# Patient Record
Sex: Female | Born: 1990 | Race: White | Hispanic: No | Marital: Single | Smoking: Current every day smoker
Health system: Southern US, Community
[De-identification: ages and names within clinical notes are randomized; demographics above are authoritative.]

## PROBLEM LIST (undated history)

## (undated) ENCOUNTER — Inpatient Hospital Stay (HOSPITAL_COMMUNITY): Payer: Self-pay

## (undated) DIAGNOSIS — M5430 Sciatica, unspecified side: Secondary | ICD-10-CM

## (undated) DIAGNOSIS — G8929 Other chronic pain: Secondary | ICD-10-CM

## (undated) DIAGNOSIS — R112 Nausea with vomiting, unspecified: Secondary | ICD-10-CM

## (undated) DIAGNOSIS — J449 Chronic obstructive pulmonary disease, unspecified: Secondary | ICD-10-CM

## (undated) DIAGNOSIS — M549 Dorsalgia, unspecified: Secondary | ICD-10-CM

## (undated) DIAGNOSIS — K219 Gastro-esophageal reflux disease without esophagitis: Secondary | ICD-10-CM

## (undated) DIAGNOSIS — F32A Depression, unspecified: Secondary | ICD-10-CM

## (undated) DIAGNOSIS — B9689 Other specified bacterial agents as the cause of diseases classified elsewhere: Secondary | ICD-10-CM

## (undated) DIAGNOSIS — F419 Anxiety disorder, unspecified: Secondary | ICD-10-CM

## (undated) DIAGNOSIS — A630 Anogenital (venereal) warts: Secondary | ICD-10-CM

## (undated) DIAGNOSIS — K259 Gastric ulcer, unspecified as acute or chronic, without hemorrhage or perforation: Secondary | ICD-10-CM

## (undated) DIAGNOSIS — R251 Tremor, unspecified: Secondary | ICD-10-CM

## (undated) DIAGNOSIS — R062 Wheezing: Secondary | ICD-10-CM

## (undated) DIAGNOSIS — F410 Panic disorder [episodic paroxysmal anxiety] without agoraphobia: Secondary | ICD-10-CM

## (undated) DIAGNOSIS — K2960 Other gastritis without bleeding: Secondary | ICD-10-CM

## (undated) DIAGNOSIS — N760 Acute vaginitis: Secondary | ICD-10-CM

## (undated) DIAGNOSIS — Z9889 Other specified postprocedural states: Secondary | ICD-10-CM

## (undated) DIAGNOSIS — R87629 Unspecified abnormal cytological findings in specimens from vagina: Secondary | ICD-10-CM

## (undated) DIAGNOSIS — Z87442 Personal history of urinary calculi: Secondary | ICD-10-CM

## (undated) DIAGNOSIS — A599 Trichomoniasis, unspecified: Secondary | ICD-10-CM

## (undated) DIAGNOSIS — E7800 Pure hypercholesterolemia, unspecified: Secondary | ICD-10-CM

## (undated) DIAGNOSIS — F99 Mental disorder, not otherwise specified: Secondary | ICD-10-CM

## (undated) DIAGNOSIS — M419 Scoliosis, unspecified: Secondary | ICD-10-CM

## (undated) DIAGNOSIS — R001 Bradycardia, unspecified: Secondary | ICD-10-CM

## (undated) DIAGNOSIS — K279 Peptic ulcer, site unspecified, unspecified as acute or chronic, without hemorrhage or perforation: Secondary | ICD-10-CM

## (undated) HISTORY — DX: Chronic obstructive pulmonary disease, unspecified: J44.9

## (undated) HISTORY — DX: Panic disorder (episodic paroxysmal anxiety): F41.0

## (undated) HISTORY — DX: Trichomoniasis, unspecified: A59.9

## (undated) HISTORY — DX: Mental disorder, not otherwise specified: F99

## (undated) HISTORY — PX: HERNIA REPAIR: SHX51

## (undated) HISTORY — DX: Scoliosis, unspecified: M41.9

## (undated) HISTORY — DX: Depression, unspecified: F32.A

## (undated) HISTORY — PX: BACK SURGERY: SHX140

## (undated) HISTORY — DX: Unspecified abnormal cytological findings in specimens from vagina: R87.629

## (undated) HISTORY — DX: Pure hypercholesterolemia, unspecified: E78.00

## (undated) HISTORY — DX: Tremor, unspecified: R25.1

## (undated) HISTORY — PX: COLPOSCOPY: SHX161

---
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
## 2009-09-30 ENCOUNTER — Emergency Department (HOSPITAL_COMMUNITY): Admission: EM | Admit: 2009-09-30 | Discharge: 2009-09-30 | Payer: Self-pay | Admitting: Emergency Medicine

## 2010-12-09 LAB — PREGNANCY, URINE: Preg Test, Ur: NEGATIVE

## 2010-12-09 LAB — URINALYSIS, ROUTINE W REFLEX MICROSCOPIC
Bilirubin Urine: NEGATIVE
Glucose, UA: NEGATIVE mg/dL
Hgb urine dipstick: NEGATIVE
Ketones, ur: NEGATIVE mg/dL
Nitrite: NEGATIVE
Protein, ur: NEGATIVE mg/dL
Specific Gravity, Urine: 1.01 (ref 1.005–1.030)
Urobilinogen, UA: 0.2 mg/dL (ref 0.0–1.0)
pH: 6 (ref 5.0–8.0)

## 2011-02-05 NOTE — Op Note (Signed)
NAME:  CHARIKA, MIKELSON NO.:  [id number]   MEDICAL RECORD NO.:  [id number]          PATIENT TYPE:  OIB   LOCATION:  3524                         FACILITY:  [facility]   PHYSICIAN:  Hewitt Shorts, M.D.DATE OF BIRTH:  1991/07/07   DATE OF PROCEDURE:  01/14/2008  DATE OF DISCHARGE:                               OPERATIVE REPORT   PREOPERATIVE DIAGNOSES:  1. Left L5-S1 lumbar disc herniation.  2. Lumbar degenerative disc disease.  3. Lumbar spondylosis.  4. Lumbar radiculopathy.   POSTOPERATIVE DIAGNOSES:  1. Left L5-S1 lumbar disc herniation.  2. Lumbar degenerative disc disease.  3. Lumbar spondylosis.  4. Lumbar radiculopathy.   PROCEDURES:  Left L5-S1 lumbar laminotomy and microdiskectomy.   SURGEON:  Hewitt Shorts, MD   ASSISTANT:  Hilda Lias, MD   ANESTHESIA:  General endotracheal.   INDICATIONS:  The patient is a 20 year old woman who presented with low  back and left lumbar radicular pain.  She had large sensitive left L5-S1  lumbar disc herniation as well as a central left L4-L5 disc protrusion.  The disc herniation was substantially large with the L5-S1 level and the  decision was made to proceed with laminotomy and microdiskectomy.   PROCEDURE:  The patient was brought to the operating room and placed  under general endotracheal anesthesia.  The patient was turned to a  prone position.  Lumbar region was prepped with Betadine soap and  solution and draped in a sterile fashion.  The midline was infiltrated  with local anesthetic with epinephrine and x-ray was taken.  The L5-S1  level was identified and a midline incision was made over the L5-S1  level and carried down through the subcutaneous tissue.  Bipolar cautery  and electrocautery was used to maintain hemostasis.  Dissection was  carried down to the lumbar fascia, which was incised on the right side  of the midline.  The paraspinal muscles were dissected from the spinous  process  and lamina in a subperiosteal fashion.  A self-retaining  retractor was placed and an x-ray was taken and the L5-S1 level  identified.  The microscope was draped and brought to the field to  provide additional navigation, illumination, and visualization, and the  remainder of the decompression was performed using microdissection and  microsurgical technique.   Laminotomy was performed using the X-Max drill and Kerrison punches.  The ligamentum flavum was carefully removed.  The edges of bone were  waxed as needed and then we identified the thecal sac and exiting left  S1 nerve root.  There instructions were gently  retracted medially  exposing a significant subligamentous disk herniation.  There was  moderate laxity of the facet capsule as well as the interspinous  ligament noted during the procedure.   The annulus was incised, the disk space was entered, and a thorough  discectomy was performed using a variety of pituitary rongeur and micro  curettes.  The significant central disc herniation was pushed back into  the disk base and removed with pituitary rongeurs and good decompression  of the thecal sac and nerve roots were  achieved and all loose fragments  of the disk material are removed from both the disk space and the  epidural space.  Once the discectomy was completed, hemostasis was  established with the use of both bipolar cautery and Gelfoam soaked in  thrombin.  Once the hemostasis was established, the Gelfoam was removed.  The hemostasis was re-confirmed.  The wounds were irrigated with  Bacitracin solution and then we instilled 2 mL of fentanyl and 80 mg of  Depo-Medrol into the epidural space and proceeded with closure.  The  deep fascia was closed with interrupted undyed #1 Vicryl sutures.  Scarpa fascia was closed with interrupted undyed #1 Vicryl sutures.  The  subcutaneous and subcuticular were closed with interrupted inverted 2-0  and 3-0 undyed Vicryl sutures.  The  skin was reapproximated with Steri-  Strip with benzoin and Adaptic and sterile gauze and Hyperflex.  The  procedure was tolerated well.  The estimated blood loss was 50 mL.  Sponge count was correct.  Following surgery, the patient was returned  back to the supine position, to be reversed from the anesthetic,  extubated, and transferred to the recovery room for further care.      Hewitt Shorts, M.D.  Electronically Signed     RWN/MEDQ  D:  01/14/2008  T:  01/15/2008  Job:  [id number]

## 2011-03-25 ENCOUNTER — Emergency Department (HOSPITAL_COMMUNITY)
Admission: EM | Admit: 2011-03-25 | Discharge: 2011-03-25 | Disposition: A | Discharge status: Home / Self Care | Payer: Self-pay | Attending: Emergency Medicine | Admitting: Emergency Medicine

## 2011-03-25 DIAGNOSIS — J029 Acute pharyngitis, unspecified: Secondary | ICD-10-CM | POA: Insufficient documentation

## 2011-03-25 LAB — RAPID STREP SCREEN (MED CTR MEBANE ONLY): Streptococcus, Group A Screen (Direct): NEGATIVE

## 2011-06-18 LAB — CBC
HCT: 42.8
Hemoglobin: 14.6
MCHC: 34.1
MCV: 91.5
Platelets: 235
RBC: 4.68
RDW: 12.7
WBC: 9.2

## 2011-06-18 LAB — PREGNANCY, URINE: Preg Test, Ur: NEGATIVE

## 2011-12-02 ENCOUNTER — Emergency Department (HOSPITAL_COMMUNITY): Payer: Medicaid Other

## 2011-12-02 ENCOUNTER — Emergency Department (HOSPITAL_COMMUNITY)
Admission: EM | Admit: 2011-12-02 | Discharge: 2011-12-02 | Disposition: A | Discharge status: Home / Self Care | Payer: Medicaid Other | Attending: Emergency Medicine | Admitting: Emergency Medicine

## 2011-12-02 ENCOUNTER — Encounter (HOSPITAL_COMMUNITY): Payer: Self-pay | Admitting: *Deleted

## 2011-12-02 DIAGNOSIS — Y92009 Unspecified place in unspecified non-institutional (private) residence as the place of occurrence of the external cause: Secondary | ICD-10-CM | POA: Insufficient documentation

## 2011-12-02 DIAGNOSIS — Z23 Encounter for immunization: Secondary | ICD-10-CM | POA: Insufficient documentation

## 2011-12-02 DIAGNOSIS — W268XXA Contact with other sharp object(s), not elsewhere classified, initial encounter: Secondary | ICD-10-CM | POA: Insufficient documentation

## 2011-12-02 DIAGNOSIS — S91312A Laceration without foreign body, left foot, initial encounter: Secondary | ICD-10-CM

## 2011-12-02 DIAGNOSIS — S91309A Unspecified open wound, unspecified foot, initial encounter: Secondary | ICD-10-CM | POA: Insufficient documentation

## 2011-12-02 DIAGNOSIS — F17200 Nicotine dependence, unspecified, uncomplicated: Secondary | ICD-10-CM | POA: Insufficient documentation

## 2011-12-02 LAB — POCT PREGNANCY, URINE: Preg Test, Ur: NEGATIVE

## 2011-12-02 MED ORDER — DOXYCYCLINE HYCLATE 100 MG PO CAPS: 100.0000 mg | ORAL_CAPSULE | Freq: Two times a day (BID) | ORAL | Status: AC

## 2011-12-02 MED ORDER — IBUPROFEN 800 MG PO TABS
800.0000 mg | ORAL_TABLET | Freq: Once | ORAL | Status: AC
  Administered 2011-12-02: 800 mg via ORAL
  Filled 2011-12-02: qty 1

## 2011-12-02 MED ORDER — DOXYCYCLINE HYCLATE 100 MG PO TABS
100.0000 mg | ORAL_TABLET | Freq: Once | ORAL | Status: AC
  Administered 2011-12-02: 100 mg via ORAL
  Filled 2011-12-02: qty 1

## 2011-12-02 MED ORDER — TETANUS-DIPHTH-ACELL PERTUSSIS 5-2.5-18.5 LF-MCG/0.5 IM SUSP
INTRAMUSCULAR | Status: AC
  Administered 2011-12-02: 0.5 mL via INTRAMUSCULAR
  Filled 2011-12-02: qty 0.5

## 2011-12-02 NOTE — ED Provider Notes (Signed)
History     CSN: [id number]  Arrival date & time 12/02/11  4098   First MD Initiated Contact with Patient 12/02/11 2028      Chief Complaint  Patient presents with  . Foot Injury    (Consider location/radiation/quality/duration/timing/severity/associated sxs/prior treatment) Patient is a 21 y.o. female presenting with foot injury. The history is provided by the patient. No language interpreter was used.  Foot Injury  The incident occurred yesterday. The incident occurred at home. Injury mechanism: pt stepped on a hunting arrow with bare foot.  has lac to plantar surface. The pain is present in the left foot. Quality: dull. The pain is at a severity of 4/10. The pain has been constant since onset. Associated symptoms include inability to bear weight and loss of motion. Associated symptoms comments: Loss of 3rd toe plantar flexion. She has tried nothing for the symptoms.    History reviewed. No pertinent past medical history.  Past Surgical History  Procedure Date  . Back surgery     No family history on file.  History  Substance Use Topics  . Smoking status: Current Everyday Smoker -- 1.0 packs/day  . Smokeless tobacco: Not on file  . Alcohol Use: No    OB History    Grav Para Term Preterm Abortions TAB SAB Ect Mult Living                  Review of Systems  Skin: Positive for wound.  All other systems reviewed and are negative.    Allergies  Review of patient's allergies indicates no known allergies.  Home Medications   Current Outpatient Rx  Name Route Sig Dispense Refill  . DOXYCYCLINE HYCLATE 100 MG PO CAPS Oral Take 1 capsule (100 mg total) by mouth 2 (two) times daily. 20 capsule 0    BP 133/73  Pulse 79  Temp(Src) 98.1 F (36.7 C) (Oral)  Resp 20  Wt 210 lb (95.255 kg)  SpO2 100%  LMP 11/07/2011  Physical Exam  Nursing note and vitals reviewed. Constitutional: She is oriented to person, place, and time. She appears well-developed and  well-nourished. No distress.  HENT:  Head: Normocephalic and atraumatic.  Eyes: EOM are normal.  Neck: Normal range of motion.  Cardiovascular: Normal rate, regular rhythm and normal heart sounds.   Pulmonary/Chest: Effort normal and breath sounds normal.  Abdominal: Soft. She exhibits no distension. There is no tenderness.  Musculoskeletal: She exhibits tenderness.       Left foot: She exhibits decreased range of motion, tenderness, swelling and laceration. She exhibits no bony tenderness, normal capillary refill, no crepitus and no deformity.       Feet:  Neurological: She is alert and oriented to person, place, and time.  Skin: Skin is warm and dry.  Psychiatric: She has a normal mood and affect. Judgment normal.    ED Course  Procedures (including critical care time)   Labs Reviewed  POCT PREGNANCY, URINE   Dg Foot Complete Left  12/02/2011  *RADIOLOGY REPORT*  Clinical Data: Laceration to the plantar surface of the foot. Evaluate for foreign body.  LEFT FOOT - COMPLETE 3+ VIEW  Comparison: 08/20/2004  Findings: No acute fracture or dislocation.  No aggressive osseous lesion.  No radiopaque foreign body identified.  IMPRESSION: No acute osseous abnormality or radiopaque foreign body identified.  Original Report Authenticated By: Waneta Martins, M.D.     1. Laceration of left foot       MDM  rx-doxy Ibuprofen  Post-op shoe Crutches Wash BID/abx ointment. i discussed the toe flexion abnormality with dr. Romeo Apple who will evaluate further.  Call for appt.       Worthy Rancher, PA 12/03/11 1255

## 2011-12-02 NOTE — ED Notes (Signed)
Lac to bottom of lt foot, injured yesterday when stepped on an arrow. Unable to flex middle 4 th and fifth toes. And toes feel Numb.

## 2011-12-02 NOTE — ED Notes (Signed)
Pain in left foot, stepped on an arrow yesterday, tetanus unknown

## 2011-12-02 NOTE — Discharge Instructions (Signed)
Laceration Care, Adult A laceration is a cut that goes through all layers of the skin. The cut goes into the tissue beneath the skin. HOME CARE For stitches (sutures) or staples:  Keep the cut clean and dry.   If you have a bandage (dressing), change it at least once a day. Change the bandage if it gets wet or dirty, or as told by your doctor.   Wash the cut with soap and water 2 times a day. Rinse the cut with water. Pat it dry with a clean towel.   Put a thin layer of medicated cream on the cut as told by your doctor.   You may shower after the first 24 hours. Do not soak the cut in water until the stitches are removed.   Only take medicines as told by your doctor.   Have your stitches or staples removed as told by your doctor.  For skin adhesive strips:  Keep the cut clean and dry.   Do not get the strips wet. You may take a bath, but be careful to keep the cut dry.   If the cut gets wet, pat it dry with a clean towel.   The strips will fall off on their own. Do not remove the strips that are still stuck to the cut.  For wound glue:  You may shower or take baths. Do not soak or scrub the cut. Do not swim. Avoid heavy sweating until the glue falls off on its own. After a shower or bath, pat the cut dry with a clean towel.   Do not put medicine on your cut until the glue falls off.   If you have a bandage, do not put tape over the glue.   Avoid lots of sunlight or tanning lamps until the glue falls off. Put sunscreen on the cut for the first year to reduce your scar.   The glue will fall off on its own. Do not pick at the glue.  You may need a tetanus shot if:  You cannot remember when you had your last tetanus shot.   You have never had a tetanus shot.  If you need a tetanus shot and you choose not to have one, you may get tetanus. Sickness from tetanus can be serious. GET HELP RIGHT AWAY IF:   Your pain does not get better with medicine.   Your arm, hand, leg, or  foot loses feeling (numbness) or changes color.   Your cut is bleeding.   Your joint feels weak, or you cannot use your joint.   You have painful lumps on your body.   Your cut is red, puffy (swollen), or painful.   You have a red line on the skin near the cut.   You have yellowish-white fluid (pus) coming from the cut.   You have a fever.   You have a bad smell coming from the cut or bandage.   Your cut breaks open before or after stitches are removed.   You notice something coming out of the cut, such as wood or glass.   You cannot move a finger or toe.  MAKE SURE YOU:   Understand these instructions.   Will watch your condition.   Will get help right away if you are not doing well or get worse.  Document Released: 02/26/2008 Document Revised: 08/29/2011 Document Reviewed: 03/05/2011 [facility] Patient Information 2012 [place], [place].   Take the antibiotic as directed.  Wash the wound twice daily with  soap and water then apply antibiotic ointment.  Take ibuprofen 800 mg every 8 hrs with food.  Use the crutches as needed and bear weight as tolerated.  i spoke with dr. Romeo Apple.  Who will evaluate the to further.  Call his office and make an appt.

## 2011-12-02 NOTE — ED Notes (Signed)
Lac cleansed, telfa and wrapped.  Then wooden shoe and crutches.

## 2011-12-04 NOTE — ED Provider Notes (Signed)
Medical screening examination/treatment/procedure(s) were performed by non-physician practitioner and as supervising physician I was immediately available for consultation/collaboration.    Nelia Shi, MD 12/04/11 2202

## 2012-01-14 ENCOUNTER — Emergency Department (HOSPITAL_COMMUNITY): Payer: Medicaid Other

## 2012-01-14 ENCOUNTER — Emergency Department (HOSPITAL_COMMUNITY)
Admission: EM | Admit: 2012-01-14 | Discharge: 2012-01-14 | Disposition: A | Discharge status: Home / Self Care | Payer: Medicaid Other | Attending: Emergency Medicine | Admitting: Emergency Medicine

## 2012-01-14 ENCOUNTER — Encounter (HOSPITAL_COMMUNITY): Payer: Self-pay | Admitting: *Deleted

## 2012-01-14 DIAGNOSIS — M545 Low back pain, unspecified: Secondary | ICD-10-CM | POA: Insufficient documentation

## 2012-01-14 DIAGNOSIS — F17200 Nicotine dependence, unspecified, uncomplicated: Secondary | ICD-10-CM | POA: Insufficient documentation

## 2012-01-14 DIAGNOSIS — X58XXXA Exposure to other specified factors, initial encounter: Secondary | ICD-10-CM | POA: Insufficient documentation

## 2012-01-14 DIAGNOSIS — S39012A Strain of muscle, fascia and tendon of lower back, initial encounter: Secondary | ICD-10-CM

## 2012-01-14 DIAGNOSIS — S335XXA Sprain of ligaments of lumbar spine, initial encounter: Secondary | ICD-10-CM | POA: Insufficient documentation

## 2012-01-14 LAB — POCT PREGNANCY, URINE: Preg Test, Ur: NEGATIVE

## 2012-01-14 MED ORDER — DIAZEPAM 5 MG PO TABS: 5.0000 mg | ORAL_TABLET | Freq: Three times a day (TID) | ORAL | Status: AC | PRN

## 2012-01-14 MED ORDER — METHOCARBAMOL 500 MG PO TABS
1000.0000 mg | ORAL_TABLET | Freq: Once | ORAL | Status: AC
  Administered 2012-01-14: 1000 mg via ORAL
  Filled 2012-01-14: qty 2

## 2012-01-14 MED ORDER — PREDNISONE 20 MG PO TABS: ORAL_TABLET | ORAL | Status: DC

## 2012-01-14 MED ORDER — OXYCODONE-ACETAMINOPHEN 5-325 MG PO TABS: 1.0000 | ORAL_TABLET | ORAL | Status: AC | PRN

## 2012-01-14 MED ORDER — OXYCODONE-ACETAMINOPHEN 5-325 MG PO TABS
1.0000 | ORAL_TABLET | Freq: Once | ORAL | Status: AC
  Administered 2012-01-14: 1 via ORAL
  Filled 2012-01-14: qty 1

## 2012-01-14 MED ORDER — PREDNISONE 20 MG PO TABS
60.0000 mg | ORAL_TABLET | Freq: Once | ORAL | Status: AC
  Administered 2012-01-14: 60 mg via ORAL
  Filled 2012-01-14 (×2): qty 3

## 2012-01-14 NOTE — ED Provider Notes (Signed)
History     CSN: [id number]  Arrival date & time 01/14/12  1531   First MD Initiated Contact with Patient 01/14/12 1612      Chief Complaint  Patient presents with  . Back Pain    (Consider location/radiation/quality/duration/timing/severity/associated sxs/prior treatment) HPI Comments: Patient presents for acute on chronic low back pain which has been worsened for the past month.  She does have a history of a L5-S1 partial discectomy surgery in 2009 by Dr. Newell Coral has been relatively pain free until the past several months.  For the past month she has had persistent pain which she describes as aching and constant without radiation.  Pain is worse with movement and certain positions.  There is one position when supine in which she can get complete pain relief.  She has tried [facility] powders and Tylenol neither give good pain relief.  She denies fevers and chills, no peripheral weakness or numbness, no urinary retention or incontinence.  She has no history of cancer or IV drug use.  Patient is a 21 y.o. female presenting with back pain.  Back Pain  Pertinent negatives include no chest pain, no fever, no numbness, no abdominal pain, no dysuria and no weakness.    History reviewed. No pertinent past medical history.  Past Surgical History  Procedure Date  . Back surgery     History reviewed. No pertinent family history.  History  Substance Use Topics  . Smoking status: Current Everyday Smoker -- 1.0 packs/day  . Smokeless tobacco: Not on file  . Alcohol Use: Yes     occasionally    OB History    Grav Para Term Preterm Abortions TAB SAB Ect Mult Living                  Review of Systems  Constitutional: Negative for fever.  Respiratory: Negative for shortness of breath.   Cardiovascular: Negative for chest pain and leg swelling.  Gastrointestinal: Negative for abdominal pain, constipation and abdominal distention.  Genitourinary: Negative for dysuria, urgency, frequency,  flank pain and difficulty urinating.  Musculoskeletal: Positive for back pain. Negative for joint swelling and gait problem.  Skin: Negative for rash.  Neurological: Negative for weakness and numbness.    Allergies  Review of patient's allergies indicates no known allergies.  Home Medications   Current Outpatient Rx  Name Route Sig Dispense Refill  . DIPHENHYDRAMINE-APAP (SLEEP) 25-500 MG PO TABS Oral Take 2 tablets by mouth at bedtime as needed. For sleep/pain    . DIAZEPAM 5 MG PO TABS Oral Take 1 tablet (5 mg total) by mouth every 8 (eight) hours as needed (muscle spasm). 10 tablet 0  . OXYCODONE-ACETAMINOPHEN 5-325 MG PO TABS Oral Take 1 tablet by mouth every 4 (four) hours as needed for pain. 20 tablet 0  . PREDNISONE 20 MG PO TABS  6, 5, 4, 3, 2 then 1 tablet by mouth daily for 6 days total. 21 tablet 0    BP 128/70  Pulse 84  Temp(Src) 98.4 F (36.9 C) (Oral)  Resp 20  Ht 5\' 8"  (1.727 m)  Wt 210 lb (95.255 kg)  BMI 31.93 kg/m2  SpO2 99%  LMP 12/23/2011  Physical Exam  Nursing note and vitals reviewed. Constitutional: She appears well-developed and well-nourished.  HENT:  Head: Normocephalic.  Eyes: Conjunctivae are normal.  Neck: Normal range of motion. Neck supple.  Cardiovascular: Normal rate and intact distal pulses.        Pedal pulses normal.  Pulmonary/Chest:  Effort normal.  Abdominal: Soft. Bowel sounds are normal. She exhibits no distension and no mass.  Musculoskeletal: Normal range of motion. She exhibits no edema.       Lumbar back: She exhibits tenderness. She exhibits no swelling, no edema and no spasm.  Neurological: She is alert. She has normal strength. She displays no atrophy and no tremor. No sensory deficit. Gait normal.  Reflex Scores:      Patellar reflexes are 2+ on the right side and 2+ on the left side.      Achilles reflexes are 2+ on the right side and 2+ on the left side.      No strength deficit noted in hip and knee flexor and  extensor muscle groups.  Ankle flexion and extension intact.  Skin: Skin is warm and dry.  Psychiatric: She has a normal mood and affect.    ED Course  Procedures (including critical care time)   Labs Reviewed  POCT PREGNANCY, URINE   Dg Lumbar Spine Complete  01/14/2012  *RADIOLOGY REPORT*  Clinical Data: Low back pain.  LUMBAR SPINE - COMPLETE 4+ VIEW  Comparison:  None.  Findings:  There is no evidence of lumbar spine fracture. Alignment is normal.  Intervertebral disc spaces are maintained.  IMPRESSION: Negative.  Original Report Authenticated By: Danae Orleans, M.D.     1. Lumbar strain    Patient given oxycodone, prednisone 60 mg by mouth and Robaxin 1 g by mouth while in the department, with some improvement in pain.   MDM  Prescribed prednisone 60 mg to 10 mg 6 day taper, Valium 3 times a day and oxycodone when necessary.  Rest, heat therapy, avoid lifting bending twisting or any activity that worsens her pain.  Recommended recheck by Dr. Newell Coral if symptoms do not resolve.  X-ray was evaluated prior to discharge home.        Candis Musa, PA 01/14/12 1749

## 2012-01-14 NOTE — Discharge Instructions (Signed)
Back Pain, Adult Low back pain is very common. About 1 in 5 people have back pain.The cause of low back pain is rarely dangerous. The pain often gets better over time.About half of people with a sudden onset of back pain feel better in just 2 weeks. About 8 in 10 people feel better by 6 weeks.  CAUSES Some common causes of back pain include:  Strain of the muscles or ligaments supporting the spine.   Wear and tear (degeneration) of the spinal discs.   Arthritis.   Direct injury to the back.  DIAGNOSIS Most of the time, the direct cause of low back pain is not known.However, back pain can be treated effectively even when the exact cause of the pain is unknown.Answering your caregiver's questions about your overall health and symptoms is one of the most accurate ways to make sure the cause of your pain is not dangerous. If your caregiver needs more information, he or she may order lab work or imaging tests (X-rays or MRIs).However, even if imaging tests show changes in your back, this usually does not require surgery. HOME CARE INSTRUCTIONS For many people, back pain returns.Since low back pain is rarely dangerous, it is often a condition that people can learn to manageon their own.   Remain active. It is stressful on the back to sit or stand in one place. Do not sit, drive, or stand in one place for more than 30 minutes at a time. Take short walks on level surfaces as soon as pain allows.Try to increase the length of time you walk each day.   Do not stay in bed.Resting more than 1 or 2 days can delay your recovery.   Do not avoid exercise or work.Your body is made to move.It is not dangerous to be active, even though your back may hurt.Your back will likely heal faster if you return to being active before your pain is gone.   Pay attention to your body when you bend and lift. Many people have less discomfortwhen lifting if they bend their knees, keep the load close to their  bodies,and avoid twisting. Often, the most comfortable positions are those that put less stress on your recovering back.   Find a comfortable position to sleep. Use a firm mattress and lie on your side with your knees slightly bent. If you lie on your back, put a pillow under your knees.   Only take over-the-counter or prescription medicines as directed by your caregiver. Over-the-counter medicines to reduce pain and inflammation are often the most helpful.Your caregiver may prescribe muscle relaxant drugs.These medicines help dull your pain so you can more quickly return to your normal activities and healthy exercise.   Put ice on the injured area.   Put ice in a plastic bag.   Place a towel between your skin and the bag.   Leave the ice on for 15 to 20 minutes, 3 to 4 times a day for the first 2 to 3 days. After that, ice and heat may be alternated to reduce pain and spasms.   Ask your caregiver about trying back exercises and gentle massage. This may be of some benefit.   Avoid feeling anxious or stressed.Stress increases muscle tension and can worsen back pain.It is important to recognize when you are anxious or stressed and learn ways to manage it.Exercise is a great option.  SEEK MEDICAL CARE IF:  You have pain that is not relieved with rest or medicine.   You have   pain that does not improve in 1 week.   You have new symptoms.   You are generally not feeling well.  SEEK IMMEDIATE MEDICAL CARE IF:   You have pain that radiates from your back into your legs.   You develop new bowel or bladder control problems.   You have unusual weakness or numbness in your arms or legs.   You develop nausea or vomiting.   You develop abdominal pain.   You feel faint.  Document Released: 09/09/2005 Document Revised: 08/29/2011 Document Reviewed: 01/28/2011 [facility] Patient Information 2012 [place], [place].   Take your next dose of prednisone tomorrow evening.  Use the the other  medicines as directed.  Do not drive within 4 hours of taking oxycodone as this will make you drowsy.  Avoid lifting,  Bending,  Twisting or any other activity that worsens your pain over the next week.  Apply an  icepack  to your lower back for 10-15 minutes every 2 hours for the next 2 days.  You should get rechecked if your symptoms are not better over the next 5 days,  Or you develop increased pain,  Weakness in your leg(s) or loss of bladder or bowel function - these are symptoms of a worse injury.

## 2012-01-14 NOTE — ED Notes (Signed)
Pt c/o back pain x 1 month. Denies injury.

## 2012-01-18 NOTE — ED Provider Notes (Signed)
Medical screening examination/treatment/procedure(s) were performed by non-physician practitioner and as supervising physician I was immediately available for consultation/collaboration.   Zali Kamaka W. Rosalie Buenaventura, MD 01/18/12 0918 

## 2012-05-26 ENCOUNTER — Other Ambulatory Visit: Payer: Self-pay

## 2012-05-26 ENCOUNTER — Other Ambulatory Visit (HOSPITAL_COMMUNITY)
Admission: RE | Admit: 2012-05-26 | Discharge: 2012-05-26 | Disposition: A | Discharge status: Home / Self Care | Payer: Medicaid Other | Source: Ambulatory Visit | Attending: Unknown Physician Specialty | Admitting: Unknown Physician Specialty

## 2012-05-26 DIAGNOSIS — R87612 Low grade squamous intraepithelial lesion on cytologic smear of cervix (LGSIL): Secondary | ICD-10-CM | POA: Insufficient documentation

## 2012-05-26 DIAGNOSIS — N72 Inflammatory disease of cervix uteri: Secondary | ICD-10-CM | POA: Insufficient documentation

## 2013-02-04 ENCOUNTER — Encounter (HOSPITAL_COMMUNITY): Payer: Self-pay | Admitting: Emergency Medicine

## 2013-02-04 ENCOUNTER — Emergency Department (HOSPITAL_COMMUNITY)
Admission: EM | Admit: 2013-02-04 | Discharge: 2013-02-04 | Disposition: A | Discharge status: Home / Self Care | Payer: Medicaid Other | Attending: Emergency Medicine | Admitting: Emergency Medicine

## 2013-02-04 DIAGNOSIS — IMO0002 Reserved for concepts with insufficient information to code with codable children: Secondary | ICD-10-CM | POA: Insufficient documentation

## 2013-02-04 DIAGNOSIS — R000 Tachycardia, unspecified: Secondary | ICD-10-CM | POA: Insufficient documentation

## 2013-02-04 DIAGNOSIS — F17200 Nicotine dependence, unspecified, uncomplicated: Secondary | ICD-10-CM | POA: Insufficient documentation

## 2013-02-04 DIAGNOSIS — N751 Abscess of Bartholin's gland: Secondary | ICD-10-CM

## 2013-02-04 MED ORDER — OXYCODONE-ACETAMINOPHEN 5-325 MG PO TABS
1.0000 | ORAL_TABLET | Freq: Once | ORAL | Status: AC
  Administered 2013-02-04: 1 via ORAL
  Filled 2013-02-04: qty 1

## 2013-02-04 MED ORDER — CEPHALEXIN 500 MG PO CAPS
500.0000 mg | ORAL_CAPSULE | Freq: Once | ORAL | Status: AC
  Administered 2013-02-04: 500 mg via ORAL
  Filled 2013-02-04: qty 1

## 2013-02-04 MED ORDER — IBUPROFEN 600 MG PO TABS: 600.0000 mg | ORAL_TABLET | Freq: Four times a day (QID) | ORAL | Status: DC | PRN

## 2013-02-04 MED ORDER — CEPHALEXIN 250 MG PO CAPS: 500.0000 mg | ORAL_CAPSULE | Freq: Two times a day (BID) | ORAL | Status: DC

## 2013-02-04 MED ORDER — LIDOCAINE HCL (PF) 1 % IJ SOLN
INTRAMUSCULAR | Status: AC
  Administered 2013-02-04: 5 mL
  Filled 2013-02-04: qty 5

## 2013-02-04 NOTE — ED Provider Notes (Signed)
History     CSN: [id number]  Arrival date & time 02/04/13  1955   None     Chief Complaint  Patient presents with  . Abscess    (Consider location/radiation/quality/duration/timing/severity/associated sxs/prior treatment) Patient is a 22 y.o. female presenting with abscess. The history is provided by the patient.  Abscess Location:  Ano-genital Ano-genital abscess location:  Vagina Abscess quality: fluctuance, painful, redness and warmth   Red streaking: no   Duration:  3 days Progression:  Worsening Pain details:    Quality:  Pressure and throbbing   Severity:  Severe   Duration:  3 days   Timing:  Constant   Progression:  Worsening Chronicity:  New Relieved by:  Nothing Associated symptoms: no fever, no headaches, no nausea and no vomiting    Debbie Santana is a 22 y.o. female who presents to the ED with vaginal pain. The pain started 3 days ago. She noted swelling and redness in the right vaginal area. The symptoms have gotten worse. The pain is severe.  No past medical history on file.  Past Surgical History  Procedure Laterality Date  . Back surgery      No family history on file.  History  Substance Use Topics  . Smoking status: Current Every Day Smoker -- 1.00 packs/day  . Smokeless tobacco: Not on file  . Alcohol Use: Yes     Comment: occasionally    OB History   Grav Para Term Preterm Abortions TAB SAB Ect Mult Living                  Review of Systems  Constitutional: Negative for fever and chills.  Respiratory: Negative for cough.   Gastrointestinal: Negative for nausea, vomiting and abdominal pain.  Genitourinary: Positive for vaginal pain. Negative for dysuria and urgency.  Musculoskeletal: Negative for back pain.  Skin: Negative for rash.  Neurological: Negative for headaches.  Psychiatric/Behavioral: The patient is not nervous/anxious.     Allergies  Review of patient's allergies indicates no known allergies.  Home Medications    Current Outpatient Rx  Name  Route  Sig  Dispense  Refill  . diphenhydramine-acetaminophen (TYLENOL PM) 25-500 MG TABS   Oral   Take 2 tablets by mouth at bedtime as needed. For sleep/pain         . predniSONE (DELTASONE) 20 MG tablet      6, 5, 4, 3, 2 then 1 tablet by mouth daily for 6 days total.   21 tablet   0     There were no vitals taken for this visit.  Physical Exam  Nursing note and vitals reviewed. Constitutional: She is oriented to person, place, and time. She appears well-developed and well-nourished. No distress.  HENT:  Head: Normocephalic.  Eyes: EOM are normal.  Neck: Neck supple.  Cardiovascular: Tachycardia present.   Pulmonary/Chest: Effort normal.  Genitourinary: There is tenderness on the right labia. There is erythema and tenderness around the vagina.  Tender, swollen, fluctuant  erythematous area right Bartholin's gland.    Musculoskeletal: Normal range of motion.  Neurological: She is alert and oriented to person, place, and time. No cranial nerve deficit.  Skin: Skin is warm and dry.  Psychiatric: She has a normal mood and affect. Her behavior is normal. Judgment and thought content normal.    ED Course  Procedures (including critical care time) Verbal consent . Time out.   Patient positioned and draped with sterile towels.  Preoperative medication: Percocet 1 tablet  po    Area cleaned with betadine Local infiltrate with lidocaine 1%. Amount 2 ccs  I&D Scalpel size: #11blade Incision type: Straight single Complexity: Complex Drained large amount of purulent drainage Probed with curved hemostat to break up loculations Irrigated with NSS  Wound treatment: Word Catheter placed and inflated with 3 cc. Of NS Patient tolerance: Tolerated procedure well.    MDM  22 y.o. female with Bartholin's abscess right Instructions to patient regarding care of Word Catheter, sitz baths and follow up for removal of the catheter. Patient voices  understanding.  Patient feeling immediate relief after area drained.  Discussed with the patient and all questioned fully answered. She will return if any problems arise.    Medication List    TAKE these medications       cephALEXin 250 MG capsule  Commonly known as:  KEFLEX  Take 2 capsules (500 mg total) by mouth 2 (two) times daily.     ibuprofen 600 MG tablet  Commonly known as:  ADVIL,MOTRIN  Take 1 tablet (600 mg total) by mouth every 6 (six) hours as needed for pain.      ASK your doctor about these medications       diphenhydramine-acetaminophen 25-500 MG Tabs  Commonly known as:  TYLENOL PM  Take 2 tablets by mouth at bedtime as needed. For sleep/pain               [facility], NP 02/04/13 2101

## 2013-02-04 NOTE — ED Provider Notes (Signed)
Medical screening examination/treatment/procedure(s) were performed by non-physician practitioner and as supervising physician I was immediately available for consultation/collaboration.   Joya Gaskins, MD 02/04/13 [phone]

## 2013-02-04 NOTE — ED Notes (Signed)
Pt has swollen , red area to rt labia,  For 2-3 days and feels it is getting worse.

## 2013-02-04 NOTE — ED Notes (Signed)
Patient complaining of knot/abscess to buttocks area. Reports noticed it approximately 2 days ago.

## 2013-02-18 ENCOUNTER — Emergency Department (HOSPITAL_COMMUNITY)
Admission: EM | Admit: 2013-02-18 | Discharge: 2013-02-18 | Disposition: A | Discharge status: Home / Self Care | Payer: Medicaid Other | Attending: Emergency Medicine | Admitting: Emergency Medicine

## 2013-02-18 ENCOUNTER — Encounter (HOSPITAL_COMMUNITY): Payer: Self-pay

## 2013-02-18 DIAGNOSIS — F17200 Nicotine dependence, unspecified, uncomplicated: Secondary | ICD-10-CM | POA: Insufficient documentation

## 2013-02-18 DIAGNOSIS — N751 Abscess of Bartholin's gland: Secondary | ICD-10-CM

## 2013-02-18 DIAGNOSIS — Z4801 Encounter for change or removal of surgical wound dressing: Secondary | ICD-10-CM | POA: Insufficient documentation

## 2013-02-18 NOTE — ED Provider Notes (Signed)
History     CSN: [id number]  Arrival date & time 02/18/13  1322   First MD Initiated Contact with Patient 02/18/13 1348      Chief Complaint  Patient presents with  . Wound Check    HPI Pt was seen at 1450.  Per pt, c/o gradual onset and persistence of constant right labial catheter that was inserted on 5/15 after I&D of Bartholin's abscess.  Pt states she has been taking her abx as prescribed.  States she is here today for removal of the catheter. Denies any complaints. Denies vaginal/labial pain, no vaginal discharge, no fevers, no swelling, no rash.    Has menses now. History reviewed. No pertinent past medical history.  Past Surgical History  Procedure Laterality Date  . Back surgery       History  Substance Use Topics  . Smoking status: Current Every Day Smoker -- 1.00 packs/day  . Smokeless tobacco: Not on file  . Alcohol Use: Yes     Comment: occasionally      Review of Systems ROS: Statement: All systems negative except as marked or noted in the HPI; Constitutional: Negative for fever and chills. ; ; Eyes: Negative for eye pain, redness and discharge. ; ; ENMT: Negative for ear pain, hoarseness, nasal congestion, sinus pressure and sore throat. ; ; Cardiovascular: Negative for chest pain, palpitations, diaphoresis, dyspnea and peripheral edema. ; ; Respiratory: Negative for cough, wheezing and stridor. ; ; Gastrointestinal: Negative for nausea, vomiting, diarrhea, abdominal pain, blood in stool, hematemesis, jaundice and rectal bleeding. . ; ; Genitourinary: Negative for dysuria, flank pain and hematuria. ; ; GYN:  No vaginal bleeding, no vaginal discharge, no vulvar pain.;; Musculoskeletal: Negative for back pain and neck pain. Negative for swelling and trauma.; ; Skin: Negative for pruritus, rash, abrasions, blisters, bruising and skin lesion.; ; Neuro: Negative for headache, lightheadedness and neck stiffness. Negative for weakness, altered level of consciousness ,  altered mental status, extremity weakness, paresthesias, involuntary movement, seizure and syncope.       Allergies  Review of patient's allergies indicates no known allergies.  Home Medications   Current Outpatient Rx  Name  Route  Sig  Dispense  Refill  . diphenhydramine-acetaminophen (TYLENOL PM) 25-500 MG TABS   Oral   Take 2 tablets by mouth at bedtime as needed. For sleep/pain         . ibuprofen (ADVIL,MOTRIN) 600 MG tablet   Oral   Take 1 tablet (600 mg total) by mouth every 6 (six) hours as needed for pain.   30 tablet   0   . cephALEXin (KEFLEX) 250 MG capsule   Oral   Take 2 capsules (500 mg total) by mouth 2 (two) times daily.   14 capsule   0     BP 123/82  Pulse 96  Temp(Src) 98.2 F (36.8 C) (Oral)  Resp 18  Ht 5\' 9"  (1.753 m)  Wt 190 lb (86.183 kg)  BMI 28.05 kg/m2  SpO2 96%  LMP 01/26/2013  Physical Exam 1455: Physical examination:  Nursing notes reviewed; Vital signs and O2 SAT reviewed;  Constitutional: Well developed, Well nourished, Well hydrated, In no acute distress; Head:  Normocephalic, atraumatic; Eyes: EOMI, PERRL, No scleral icterus; ENMT: Mouth and pharynx normal, Mucous membranes moist; Neck: Supple, Full range of motion, No lymphadenopathy; Cardiovascular: Regular rate and rhythm, No murmur, rub, or gallop; Respiratory: Breath sounds clear & equal bilaterally, No rales, rhonchi, wheezes.  Speaking full sentences with ease, Normal respiratory  effort/excursion; Chest: Nontender, Movement normal; Abdomen: Soft, Nontender, Nondistended, Normal bowel sounds; Genitourinary: No CVA tenderness. +word catheter right labial area.; Extremities: Pulses normal, No tenderness, No edema, No calf edema or asymmetry.; Neuro: AA&Ox3, Major CN grossly intact.  Speech clear. No gross focal motor or sensory deficits in extremities.; Skin: Color normal, Warm, Dry.   ED Course  Procedures  1500:  Word catheter removed intact. No drainage from site, no  erythema, no edema, and area is NT to palp.  Will have pt continue abx, warm water soaks, f/u PMD or OB/GYN within the next week. Verb understanding.     MDM  MDM Reviewed: previous chart, vitals and nursing note            Laray Anger, DO 02/21/13 0011

## 2013-02-18 NOTE — ED Notes (Signed)
Here to have a ? Ward catheter removed from her vaginal area

## 2013-04-01 ENCOUNTER — Encounter (HOSPITAL_COMMUNITY): Payer: Self-pay | Admitting: Emergency Medicine

## 2013-04-01 ENCOUNTER — Emergency Department (HOSPITAL_COMMUNITY)
Admission: EM | Admit: 2013-04-01 | Discharge: 2013-04-02 | Disposition: A | Discharge status: Home / Self Care | Payer: Medicaid Other | Attending: Emergency Medicine | Admitting: Emergency Medicine

## 2013-04-01 ENCOUNTER — Emergency Department (HOSPITAL_COMMUNITY): Payer: Medicaid Other

## 2013-04-01 DIAGNOSIS — O209 Hemorrhage in early pregnancy, unspecified: Secondary | ICD-10-CM

## 2013-04-01 DIAGNOSIS — M545 Low back pain, unspecified: Secondary | ICD-10-CM | POA: Insufficient documentation

## 2013-04-01 DIAGNOSIS — O99330 Smoking (tobacco) complicating pregnancy, unspecified trimester: Secondary | ICD-10-CM | POA: Insufficient documentation

## 2013-04-01 DIAGNOSIS — O9989 Other specified diseases and conditions complicating pregnancy, childbirth and the puerperium: Secondary | ICD-10-CM | POA: Insufficient documentation

## 2013-04-01 DIAGNOSIS — R109 Unspecified abdominal pain: Secondary | ICD-10-CM | POA: Insufficient documentation

## 2013-04-01 DIAGNOSIS — O200 Threatened abortion: Secondary | ICD-10-CM

## 2013-04-01 DIAGNOSIS — R102 Pelvic and perineal pain: Secondary | ICD-10-CM

## 2013-04-01 LAB — CBC WITH DIFFERENTIAL/PLATELET
HCT: 42.9 % (ref 36.0–46.0)
Hemoglobin: 14.9 g/dL (ref 12.0–15.0)
Lymphocytes Relative: 27 % (ref 12–46)
Monocytes Absolute: 0.6 10*3/uL (ref 0.1–1.0)
Monocytes Relative: 5 % (ref 3–12)
Neutro Abs: 7.5 10*3/uL (ref 1.7–7.7)
Neutrophils Relative %: 68 % (ref 43–77)
RBC: 4.55 MIL/uL (ref 3.87–5.11)
WBC: 11.1 10*3/uL — ABNORMAL HIGH (ref 4.0–10.5)

## 2013-04-01 LAB — WET PREP, GENITAL
Trich, Wet Prep: NONE SEEN
WBC, Wet Prep HPF POC: NONE SEEN
Yeast Wet Prep HPF POC: NONE SEEN

## 2013-04-01 LAB — HCG, QUANTITATIVE, PREGNANCY: hCG, Beta Chain, Quant, S: 70 m[IU]/mL — ABNORMAL HIGH (ref <5)

## 2013-04-01 LAB — URINALYSIS, ROUTINE W REFLEX MICROSCOPIC
Bilirubin Urine: NEGATIVE
Ketones, ur: NEGATIVE mg/dL
Protein, ur: NEGATIVE mg/dL
Urobilinogen, UA: 0.2 mg/dL (ref 0.0–1.0)

## 2013-04-01 LAB — BASIC METABOLIC PANEL
BUN: 9 mg/dL (ref 6–23)
CO2: 26 mEq/L (ref 19–32)
Chloride: 104 mEq/L (ref 96–112)
Creatinine, Ser: 0.8 mg/dL (ref 0.50–1.10)
GFR calc Af Amer: >90 mL/min (ref >90)
Potassium: 3.3 mEq/L — ABNORMAL LOW (ref 3.5–5.1)

## 2013-04-01 LAB — PREGNANCY, URINE: Preg Test, Ur: POSITIVE — AB

## 2013-04-01 LAB — URINE MICROSCOPIC-ADD ON

## 2013-04-01 LAB — ABO/RH: ABO/RH(D): A POS

## 2013-04-01 MED ORDER — PRENATAL COMPLETE 14-0.4 MG PO TABS: 1.0000 | ORAL_TABLET | Freq: Every day | ORAL | Status: DC

## 2013-04-01 NOTE — ED Provider Notes (Signed)
Received at sign out. 22yo F, c/o pelvic cramping and vaginal bleeding today. RH positive. B-Quant 70, no visualization of pregnancy on US pelvis as well as no FF, no adnexal masses.  Pt states she has an appt with [facility] on Monday.  T/C to OB/GYN Dr. Emelda Fear, case discussed, including:  HPI, pertinent PM/SHx, VS/PE, dx testing, ED course and treatment:  requests to d/c pt and have her f/u at [facility] on Sunday morning for a re-check of her B-quant. Dx and testing, as well as d/w OB/GYN MD, d/w pt and family.  Questions answered.  Verb understanding, agreeable to d/c home with outpt f/u on Sunday morning.    Results for orders placed during the hospital encounter of 04/01/13  WET PREP, GENITAL      Result Value Range   Yeast Wet Prep HPF POC NONE SEEN  NONE SEEN   Trich, Wet Prep NONE SEEN  NONE SEEN   Clue Cells Wet Prep HPF POC NONE SEEN  NONE SEEN   WBC, Wet Prep HPF POC NONE SEEN  NONE SEEN  URINALYSIS, ROUTINE W REFLEX MICROSCOPIC      Result Value Range   Color, Urine YELLOW  YELLOW   APPearance CLEAR  CLEAR   Specific Gravity, Urine 1.025  1.005 - 1.030   pH 6.0  5.0 - 8.0   Glucose, UA NEGATIVE  NEGATIVE mg/dL   Hgb urine dipstick MODERATE (*) NEGATIVE   Bilirubin Urine NEGATIVE  NEGATIVE   Ketones, ur NEGATIVE  NEGATIVE mg/dL   Protein, ur NEGATIVE  NEGATIVE mg/dL   Urobilinogen, UA 0.2  0.0 - 1.0 mg/dL   Nitrite NEGATIVE  NEGATIVE   Leukocytes, UA NEGATIVE  NEGATIVE  PREGNANCY, URINE      Result Value Range   Preg Test, Ur POSITIVE (*) NEGATIVE  URINE MICROSCOPIC-ADD ON      Result Value Range   Squamous Epithelial / LPF FEW (*) RARE   WBC, UA 0-2  <3 WBC/hpf   RBC / HPF 3-6  <3 RBC/hpf   Bacteria, UA RARE  RARE  CBC WITH DIFFERENTIAL      Result Value Range   WBC 11.1 (*) 4.0 - 10.5 K/uL   RBC 4.55  3.87 - 5.11 MIL/uL   Hemoglobin 14.9  12.0 - 15.0 g/dL   HCT 14.7  82.9 - 56.2 %   MCV 94.3  78.0 - 100.0 fL   MCH 32.7  26.0 - 34.0 pg   MCHC 34.7  30.0 -  36.0 g/dL   RDW 13.0  86.5 - 78.4 %   Platelets 235  150 - 400 K/uL   Neutrophils Relative % 68  43 - 77 %   Neutro Abs 7.5  1.7 - 7.7 K/uL   Lymphocytes Relative 27  12 - 46 %   Lymphs Abs 2.9  0.7 - 4.0 K/uL   Monocytes Relative 5  3 - 12 %   Monocytes Absolute 0.6  0.1 - 1.0 K/uL   Eosinophils Relative 1  0 - 5 %   Eosinophils Absolute 0.1  0.0 - 0.7 K/uL   Basophils Relative 0  0 - 1 %   Basophils Absolute 0.0  0.0 - 0.1 K/uL  BASIC METABOLIC PANEL      Result Value Range   Sodium 138  135 - 145 mEq/L   Potassium 3.3 (*) 3.5 - 5.1 mEq/L   Chloride 104  96 - 112 mEq/L   CO2 26  19 - 32 mEq/L  Glucose, Bld 103 (*) 70 - 99 mg/dL   BUN 9  6 - 23 mg/dL   Creatinine, Ser 4.09  0.50 - 1.10 mg/dL   Calcium 9.8  8.4 - 81.1 mg/dL   GFR calc non Af Amer >90  >90 mL/min   GFR calc Af Amer >90  >90 mL/min  HCG, QUANTITATIVE, PREGNANCY      Result Value Range   hCG, Beta Chain, Quant, S 70 (*) <5 mIU/mL  ABO/RH      Result Value Range   ABO/RH(D) A POS     US Ob Comp Less 14 Wks 04/01/2013   *RADIOLOGY REPORT*  Clinical Data: bleeding preg,bleeding ; ;  OBSTETRIC <14 WK [place] AND TRANSVAGINAL OB US  Technique: Both transabdominal and transvaginal ultrasound examinations were performed for complete evaluation of the gestation as well as the maternal uterus, adnexal regions, and pelvic cul-de-sac.  Comparison: None.  Findings: No intrauterine gestational sac is visualized.  Normal echotexture throughout the endometrium.  Endometrial stripe is normal appearance at 9 mm.  Ovaries are symmetric in size and echotexture.  No adnexal mass. No free fluid.  IMPRESSION: No intrauterine pregnancy visualized.  Differential considerations would include an early intrauterine pregnancy too early to visualize, spontaneous abortion, or occult ectopic pregnancy. Recommend follow up with serial quantitative beta HCGs and ultrasounds.   Original Report Authenticated By: Charlett Nose, M.D.      Laray Anger,  DO 04/01/13 2345

## 2013-04-01 NOTE — ED Provider Notes (Signed)
History    CSN: [id number] Arrival date & time 04/01/13  1839  First MD Initiated Contact with Patient 04/01/13 1959     Chief Complaint  Patient presents with  . Vaginal Bleeding   (Consider location/radiation/quality/duration/timing/severity/associated sxs/prior Treatment) HPI Comments: Debbie Santana is a 22 y.o. Female who presents for evaluation of vaginal bleeding. She discovered this morning, that she was pregnant. Her LMP is 02/04/2013. This is her first pregnancy. She has abdominal cramping that started today, and low back pain that has been present for 3 days. She feels a fluttering sensation in her pelvic region. She denies weakness, dizziness, nausea, vomiting, headache, shortness of breath, or chest pain. There are no other known modifying factors.  Patient is a 22 y.o. female presenting with vaginal bleeding. The history is provided by the patient.  Vaginal Bleeding  History reviewed. No pertinent past medical history. Past Surgical History  Procedure Laterality Date  . Back surgery     No family history on file. History  Substance Use Topics  . Smoking status: Current Every Day Smoker -- 1.00 packs/day  . Smokeless tobacco: Not on file  . Alcohol Use: Yes     Comment: occasionally   OB History   Grav Para Term Preterm Abortions TAB SAB Ect Mult Living   1              Review of Systems  Genitourinary: Positive for vaginal bleeding.  All other systems reviewed and are negative.    Allergies  Review of patient's allergies indicates no known allergies.  Home Medications  No current outpatient prescriptions on file. BP 120/60  Pulse 66  Temp(Src) 99 F (37.2 C)  Resp 18  Ht 5\' 8"  (1.727 m)  Wt 203 lb (92.08 kg)  BMI 30.87 kg/m2  SpO2 100%  LMP 02/04/2013 Physical Exam  Nursing note and vitals reviewed. Constitutional: She is oriented to person, place, and time. She appears well-developed and well-nourished.  HENT:  Head: Normocephalic and  atraumatic.  Eyes: Conjunctivae and EOM are normal. Pupils are equal, round, and reactive to light.  Neck: Normal range of motion and phonation normal. Neck supple.  Cardiovascular: Normal rate, regular rhythm and intact distal pulses.   Pulmonary/Chest: Effort normal and breath sounds normal. She exhibits no tenderness.  Abdominal: Soft. She exhibits no distension and no mass. There is tenderness (mild diffuse abdominal tenderness. No suprapubic tenderness.). There is no guarding.  Genitourinary:  Normal external female genitalia. Small amount of dark blood in the vagina. Cervix is closed. On bimanual examination there is no cervical motion tenderness. There is no midline tenderness. There is mild left adnexal tenderness, without palpable mass. There is no right adnexal tenderness. The ovaries are not palpable  Musculoskeletal: Normal range of motion.  Neurological: She is alert and oriented to person, place, and time. She has normal strength. She exhibits normal muscle tone.  Skin: Skin is warm and dry.  Psychiatric: She has a normal mood and affect. Her behavior is normal. Judgment and thought content normal.    ED Course  Procedures (including critical care time)  Medications - No data to display  Patient Vitals for the past 24 hrs:  BP Temp Pulse Resp SpO2 Height Weight  04/01/13 2044 120/60 mmHg - 66 18 100 % - -  04/01/13 1901 126/86 mmHg 99 F (37.2 C) 72 - 99 % 5\' 8"  (1.727 m) 203 lb (92.08 kg)   2100- pelvic ultrasound ordered to rule out ectopic pregnancy,  spontaneous abortion, and pregnancy complications.   Labs Reviewed  URINALYSIS, ROUTINE W REFLEX MICROSCOPIC - Abnormal; Notable for the following:    Hgb urine dipstick MODERATE (*)    All other components within normal limits  PREGNANCY, URINE - Abnormal; Notable for the following:    Preg Test, Ur POSITIVE (*)    All other components within normal limits  URINE MICROSCOPIC-ADD ON - Abnormal; Notable for the  following:    Squamous Epithelial / LPF FEW (*)    All other components within normal limits  CBC WITH DIFFERENTIAL - Abnormal; Notable for the following:    WBC 11.1 (*)    All other components within normal limits  GC/CHLAMYDIA PROBE AMP  WET PREP, GENITAL  BASIC METABOLIC PANEL  HCG, QUANTITATIVE, PREGNANCY  ABO/RH   No results found.  1. Vaginal bleeding before [redacted] weeks gestation   2. Pelvic pain     MDM  First trimester pregnancy with vaginal bleeding and pelvic pain. This required evaluation for ectopic pregnancy, abnormal pregnancy and spontaneous abortion.   Care to Dr. Clarene Duke to disposition after U/S and labs return.  Flint Melter, MD 04/02/13 [phone]

## 2013-04-01 NOTE — ED Notes (Signed)
Pt [facility] months [facility] 5/15 per pt., just started having vaginal bleeding after visit of bright red blood and mild abd cramping, + nausea and diarrhea, denies vomiting

## 2013-04-01 NOTE — ED Notes (Signed)
Discharge instructions given and reviewed with patient.  Prescription given for Prenatal Vitamins.  Patient verbalized understanding to take Vitamins as directed and to follow up with [facility] on Sunday am for repeat HCG.  Patient ambulatory; discharged home in good condition.

## 2013-04-01 NOTE — ED Notes (Signed)
Patient states found out today at [facility] that she is 2 months pregnant based on last period.  Patient states at 6pm, she went to bathroom and when she wiped she noticed blood on paper.  States no blood in toilet, just on paper.  Patient c/o abdominal cramping and lower back pain.

## 2013-04-02 ENCOUNTER — Other Ambulatory Visit: Payer: Self-pay | Admitting: Obstetrics & Gynecology

## 2013-04-02 DIAGNOSIS — O3680X0 Pregnancy with inconclusive fetal viability, not applicable or unspecified: Secondary | ICD-10-CM

## 2013-04-03 LAB — GC/CHLAMYDIA PROBE AMP: GC Probe RNA: NEGATIVE

## 2013-04-04 ENCOUNTER — Inpatient Hospital Stay (HOSPITAL_COMMUNITY)
Admission: AD | Admit: 2013-04-04 | Discharge: 2013-04-04 | Disposition: A | Discharge status: Home / Self Care | Payer: Medicaid Other | Source: Ambulatory Visit | Attending: Obstetrics & Gynecology | Admitting: Obstetrics & Gynecology

## 2013-04-04 ENCOUNTER — Encounter (HOSPITAL_COMMUNITY): Payer: Self-pay | Admitting: *Deleted

## 2013-04-04 DIAGNOSIS — O039 Complete or unspecified spontaneous abortion without complication: Secondary | ICD-10-CM | POA: Insufficient documentation

## 2013-04-04 DIAGNOSIS — M545 Low back pain, unspecified: Secondary | ICD-10-CM | POA: Insufficient documentation

## 2013-04-04 LAB — CBC
Hemoglobin: 14.4 g/dL (ref 12.0–15.0)
MCH: 32.7 pg (ref 26.0–34.0)
MCHC: 35.4 g/dL (ref 30.0–36.0)
MCV: 92.5 fL (ref 78.0–100.0)
RBC: 4.4 MIL/uL (ref 3.87–5.11)

## 2013-04-04 NOTE — MAU Note (Signed)
Pt reports she has had vaginal bleeding since Thursday. Bleeding like a period with some clots and some cramping.

## 2013-04-04 NOTE — MAU Provider Note (Signed)
Debbie Santana is a 22 y.o. G1P0 at [redacted]w[redacted]d here for follow up quant HCG. Vaginal bleeding: less flow than a normal period. Abdominal pain: none. Mild low back pain. Seen at [facility] on 04/01/13 with bleeding.   Prior HCGs:04/01/13 - 70  Prior U/S: 04/01/13 - no IUP or adnexal mass  No past medical history on file.  Prescriptions prior to admission  Medication Sig Dispense Refill  . Prenatal Vit-Fe Fumarate-FA (PRENATAL COMPLETE) 14-0.4 MG TABS Take 1 tablet by mouth daily.  30 each  0    No Known Allergies  Review of Systems - Negative except vaginal bleeding and low back pain  Objective BP 133/75  Pulse 80  Temp(Src) 98 F (36.7 C) (Oral)  Resp 18  Ht 5\' 9"  (1.753 m)  Wt 199 lb 9.6 oz (90.538 kg)  BMI 29.46 kg/m2  LMP 02/04/2013  General: Alert, oriented, no acute distress  Results for orders placed during the hospital encounter of 04/04/13 (from the past 24 hour(s))  HCG, QUANTITATIVE, PREGNANCY     Status: Abnormal   Collection Time    04/04/13 10:10 AM      Result Value Range   hCG, Beta Chain, Quant, S 13 (*) <5 mIU/mL  CBC     Status: None   Collection Time    04/04/13 10:10 AM      Result Value Range   WBC 7.1  4.0 - 10.5 K/uL   RBC 4.40  3.87 - 5.11 MIL/uL   Hemoglobin 14.4  12.0 - 15.0 g/dL   HCT 16.1  09.6 - 04.5 %   MCV 92.5  78.0 - 100.0 fL   MCH 32.7  26.0 - 34.0 pg   MCHC 35.4  30.0 - 36.0 g/dL   RDW 40.9  81.1 - 91.4 %   Platelets 213  150 - 400 K/uL    Assessment  1. Miscarriage     Plan Precautions rev'd Call [facility] tomorrow for follow up plan     Medication List         Prenatal Complete 14-0.4 MG Tabs  Take 1 tablet by mouth daily.        Follow-up Information   Follow up with [facility]. (as scheduled)    Contact information:   [address] [place] [address] [phone]      [facility] 10:58 AM 04/04/13

## 2013-04-05 ENCOUNTER — Other Ambulatory Visit: Payer: Self-pay

## 2013-04-21 ENCOUNTER — Ambulatory Visit (INDEPENDENT_AMBULATORY_CARE_PROVIDER_SITE_OTHER): Payer: Medicaid Other | Admitting: Advanced Practice Midwife

## 2013-04-21 ENCOUNTER — Encounter: Payer: Self-pay | Admitting: Advanced Practice Midwife

## 2013-04-21 VITALS — BP 100/70 | Wt 201.0 lb

## 2013-04-21 DIAGNOSIS — Z3202 Encounter for pregnancy test, result negative: Secondary | ICD-10-CM

## 2013-04-21 DIAGNOSIS — O039 Complete or unspecified spontaneous abortion without complication: Secondary | ICD-10-CM

## 2013-04-21 LAB — HCG, QUANTITATIVE, PREGNANCY: hCG, Beta Chain, Quant, S: <2 m[IU]/mL

## 2013-04-21 NOTE — Addendum Note (Signed)
Addended by: Criss Alvine on: 04/21/2013 10:30 AM   Modules accepted: Orders

## 2013-04-21 NOTE — Progress Notes (Signed)
Debbie Santana 21 y.o. Here for f/u 1st trimester SAB 04/04/13.  She bled for a few days afterwards, none now.  Has a rx for OCP's.  Review of Systems   Constitutional: Negative for fever and chills Eyes: Negative for visual disturbances Respiratory: Negative for shortness of breath, dyspnea Cardiovascular: Negative for chest pain or palpitations  Gastrointestinal: Negative for vomiting, diarrhea and constipation Genitourinary: Negative for dysuria and urgency Musculoskeletal: Negative for back pain, joint pain, myalgias  Neurological: Negative for dizziness and headaches  Physical Exam  Constitutional: She is well-developed, well-nourished, and in no distress.  Abdominal: Soft. There is no tenderness.  Genitourinary: Vagina normal, uterus normal, cervix normal, right adnexa normal and left adnexa normal.  Uterus well involuted.  Skin: Skin is warm and dry.  Psychiatric: Affect normal.   ASSESSMENT:   Complete Spontaneous ab  PLAN:  QHCG,  Start COC's now with b/u for 3 weeks

## 2013-06-15 ENCOUNTER — Encounter (HOSPITAL_COMMUNITY): Payer: Self-pay

## 2013-06-15 ENCOUNTER — Emergency Department (HOSPITAL_COMMUNITY)
Admission: EM | Admit: 2013-06-15 | Discharge: 2013-06-15 | Disposition: A | Discharge status: Home / Self Care | Payer: Medicaid Other | Attending: Emergency Medicine | Admitting: Emergency Medicine

## 2013-06-15 DIAGNOSIS — F17200 Nicotine dependence, unspecified, uncomplicated: Secondary | ICD-10-CM | POA: Insufficient documentation

## 2013-06-15 DIAGNOSIS — M65839 Other synovitis and tenosynovitis, unspecified forearm: Secondary | ICD-10-CM | POA: Insufficient documentation

## 2013-06-15 DIAGNOSIS — M778 Other enthesopathies, not elsewhere classified: Secondary | ICD-10-CM

## 2013-06-15 DIAGNOSIS — N750 Cyst of Bartholin's gland: Secondary | ICD-10-CM

## 2013-06-15 MED ORDER — SULFAMETHOXAZOLE-TRIMETHOPRIM 800-160 MG PO TABS: 2.0000 | ORAL_TABLET | Freq: Two times a day (BID) | ORAL | Status: DC

## 2013-06-15 MED ORDER — IBUPROFEN 600 MG PO TABS: 600.0000 mg | ORAL_TABLET | Freq: Four times a day (QID) | ORAL | Status: DC | PRN

## 2013-06-15 NOTE — ED Notes (Signed)
Complain of pain in right wrist. No known injury. Also, has a cyst in vaginal area

## 2013-06-16 NOTE — ED Provider Notes (Signed)
CSN: [id number]     Arrival date & time 06/15/13  1412 History   First MD Initiated Contact with Patient 06/15/13 1447     Chief Complaint  Patient presents with  . Hand Pain   (Consider location/radiation/quality/duration/timing/severity/associated sxs/prior Treatment) HPI Comments: Debbie Santana is a 22 y.o. Female presenting with 2 complaints, the first being right pain without injury. She reports overusing the extremity while cleaning a neighbors home several days ago, which involved mopping and vacuuming and since has had pain along her dorsal wrist with radiation into the lower forearm with extension of the wrist. She denies a history of injury in this joint.  She is right handed.  She denies swelling, numbness or weakness of the extremity.  She has found no alleviators.  Extension and palpation make the pain worse.  Her second complaint is for return of pain and swelling 2 days ago at the site of her last bartholins gland abscess which was I & D'd here several months ago.  She denies drainage from the site. Additionally she denies pelvic pain, vaginal discharge and dysuria.  She has used warm water soaks without relief.  Other pertinent negative include abdominal pain, nausea, vomiting, fevers and chills.  She is not currently pregnant. She had a spontaneous abortion 04/01/12 with followup care by [facility].  She denies being sexually active since this time.     The history is provided by the patient and a parent.    History reviewed. No pertinent past medical history. Past Surgical History  Procedure Laterality Date  . Back surgery     No family history on file. History  Substance Use Topics  . Smoking status: Current Every Day Smoker -- 1.00 packs/day  . Smokeless tobacco: Not on file     Comment: never done snuff or chewing tobacco.  . Alcohol Use: Yes     Comment: occasionally   OB History   Grav Para Term Preterm Abortions TAB SAB Ect Mult Living   1               Review of Systems  Constitutional: Negative for fever.  HENT: Negative for congestion, sore throat and neck pain.   Eyes: Negative.   Respiratory: Negative for chest tightness and shortness of breath.   Cardiovascular: Negative for chest pain.  Gastrointestinal: Negative for nausea and abdominal pain.  Genitourinary: Positive for vaginal pain. Negative for dysuria, vaginal discharge, menstrual problem and pelvic pain.  Musculoskeletal: Positive for arthralgias. Negative for joint swelling.  Skin: Negative.  Negative for rash and wound.  Neurological: Negative for dizziness, weakness, light-headedness, numbness and headaches.  Psychiatric/Behavioral: Negative.     Allergies  Review of patient's allergies indicates no known allergies.  Home Medications   Current Outpatient Rx  Name  Route  Sig  Dispense  Refill  . ibuprofen (ADVIL,MOTRIN) 600 MG tablet   Oral   Take 1 tablet (600 mg total) by mouth every 6 (six) hours as needed for pain.   30 tablet   0   . sulfamethoxazole-trimethoprim (SEPTRA DS) 800-160 MG per tablet   Oral   Take 2 tablets by mouth 2 (two) times daily.   28 tablet   0    BP 124/71  Pulse 72  Temp(Src) 99 F (37.2 C) (Oral)  Resp 18  Ht 5\' 8"  (1.727 m)  Wt 210 lb (95.255 kg)  BMI 31.94 kg/m2  SpO2 100%  LMP 02/04/2013  Breastfeeding? No Physical Exam  Nursing note  and vitals reviewed. Constitutional: She appears well-developed and well-nourished.  HENT:  Head: Normocephalic and atraumatic.  Eyes: Conjunctivae are normal.  Neck: Normal range of motion.  Cardiovascular: Normal rate, regular rhythm and normal heart sounds.   Pulses equal bilaterally  Pulmonary/Chest: Effort normal and breath sounds normal. She has no wheezes.  Abdominal: Soft. Bowel sounds are normal. There is no tenderness. There is no rebound.  Genitourinary:    There is tenderness on the right labia.  ttp right posterior labia majora.  There is no  Appreciable edema,  no induration or fluctuance.  Mild increased erythema without drainage or lesion.    Musculoskeletal: Normal range of motion. She exhibits tenderness.       Right wrist: She exhibits tenderness. She exhibits normal range of motion, no swelling, no effusion and no crepitus.  ttp across right distal mid dorsal radius with increased pain with active extension, not passive extension.  No edema, no ecchymosis. No deformity,  No crepitus. Pt has less than 3 sec cap refill in fingers.  Radial pulses full and equal.    Neurological: She is alert. She has normal strength. She displays normal reflexes. No sensory deficit.  Equal strength  Skin: Skin is warm and dry.  Psychiatric: She has a normal mood and affect.    ED Course  Procedures (including critical care time) Labs Review Labs Reviewed - No data to display Imaging Review No results found.  MDM   1. Tendonitis of wrist, right   2. Bartholin's gland cyst    Possible early return of bartholins cyst abscess, but no evidence today of drainable pus pocket on exam.  She was encouraged to continue warm soaks,  Gentle massage of area,  No squeezing.  Prescribed bactrim,  Ibuprofen.  Also given velcro wrist splint for suspected right wrist extensor tendonitis.  No injury,  No exam findings suggesting need for xrays today.  Advised recheck  By Dr. Emelda Fear if not improving with tx,  Or for any worsened sx.  The patient appears reasonably screened and/or stabilized for discharge and I doubt any other medical condition or other [facility] requiring further screening, evaluation, or treatment in the ED at this time prior to discharge.     Burgess Amor, PA-C 06/16/13 2225

## 2013-06-21 NOTE — ED Provider Notes (Signed)
Medical screening examination/treatment/procedure(s) were performed by non-physician practitioner and as supervising physician I was immediately available for consultation/collaboration.   Kristen N Ward, DO 06/21/13 0656 

## 2013-10-04 ENCOUNTER — Emergency Department (HOSPITAL_COMMUNITY)
Admission: EM | Admit: 2013-10-04 | Discharge: 2013-10-04 | Disposition: A | Discharge status: Home / Self Care | Payer: Medicaid Other | Attending: Emergency Medicine | Admitting: Emergency Medicine

## 2013-10-04 ENCOUNTER — Encounter (HOSPITAL_COMMUNITY): Payer: Self-pay | Admitting: Emergency Medicine

## 2013-10-04 ENCOUNTER — Emergency Department (HOSPITAL_COMMUNITY): Payer: Medicaid Other

## 2013-10-04 DIAGNOSIS — Z9889 Other specified postprocedural states: Secondary | ICD-10-CM | POA: Insufficient documentation

## 2013-10-04 DIAGNOSIS — S62329A Displaced fracture of shaft of unspecified metacarpal bone, initial encounter for closed fracture: Secondary | ICD-10-CM | POA: Insufficient documentation

## 2013-10-04 DIAGNOSIS — Y9389 Activity, other specified: Secondary | ICD-10-CM | POA: Insufficient documentation

## 2013-10-04 DIAGNOSIS — Z792 Long term (current) use of antibiotics: Secondary | ICD-10-CM | POA: Insufficient documentation

## 2013-10-04 DIAGNOSIS — Y929 Unspecified place or not applicable: Secondary | ICD-10-CM | POA: Insufficient documentation

## 2013-10-04 DIAGNOSIS — S62306A Unspecified fracture of fifth metacarpal bone, right hand, initial encounter for closed fracture: Secondary | ICD-10-CM

## 2013-10-04 DIAGNOSIS — F17200 Nicotine dependence, unspecified, uncomplicated: Secondary | ICD-10-CM | POA: Insufficient documentation

## 2013-10-04 DIAGNOSIS — W010XXA Fall on same level from slipping, tripping and stumbling without subsequent striking against object, initial encounter: Secondary | ICD-10-CM | POA: Insufficient documentation

## 2013-10-04 MED ORDER — HYDROCODONE-ACETAMINOPHEN 5-325 MG PO TABS
2.0000 | ORAL_TABLET | Freq: Once | ORAL | Status: AC
  Administered 2013-10-04: 2 via ORAL
  Filled 2013-10-04: qty 2

## 2013-10-04 MED ORDER — HYDROCODONE-ACETAMINOPHEN 5-325 MG PO TABS: 1.0000 | ORAL_TABLET | ORAL | Status: DC | PRN

## 2013-10-04 NOTE — ED Notes (Addendum)
Hand swollen with abrasion present.  Ice pack applied and pain med given and ?small red area? Puncture..  Alert, denies furthur injury

## 2013-10-04 NOTE — Discharge Instructions (Signed)
Use the RICE method to help with pain and swelling (see below) Take Vicodin for severe pain - Please be careful with this medication.  It can cause drowsiness.  Use caution while driving, operating machinery, drinking alcohol, or any other activities that may impair your physical or mental abilities Return to the emergency department if you develop any changing/worsening condition, weakness, loss of sensation, spreading redness/swelling, fever, blue/white fingers, or any other concerns (please read additional information regarding your condition below)   Boxer's Fracture You have a break (fracture) of the fifth metacarpal bone. This is commonly called a boxer's fracture. This is the bone in the hand where the little finger attaches. The fracture is in the end of that bone, closest to the little finger. It is usually caused when you hit an object with a clenched fist. Often, the knuckle is pushed down by the impact. Sometimes, the fracture rotates out of position. A boxer's fracture will usually heal within 6 weeks, if it is treated properly and protected from re-injury. Surgery is sometimes needed. A cast, splint, or bulky hand dressing may be used to protect and immobilize a boxer's fracture. Do not remove this device or dressing until your caregiver approves. Keep your hand elevated, and apply ice packs for 15-20 minutes every 2 hours, for the first 2 days. Elevation and ice help reduce swelling and relieve pain. See your caregiver, or an orthopedic specialist, for follow-up care within the next 10 days. This is to make sure your fracture is healing properly. Document Released: 09/09/2005 Document Revised: 12/02/2011 Document Reviewed: 02/27/2007 [facility] Patient Information 2014 [place], [place].   Splint Care Splints protect and rest injuries. Splints can be made of plaster, fiberglass, or metal. They are used to treat broken bones, sprains, tendonitis, and other injuries. HOME CARE  Keep the  injured area raised (elevated) while sitting or lying down. Keep the injured body part just above the level of the heart. This will decrease puffiness (swelling) and pain.  If an elastic bandage was used to hold the splint, it can be loosened. Only loosen it to make room for puffiness and to ease pain.  Keep the splint clean and dry.  Do not scratch the skin under the splint with sharp or pointed objects.  Follow up with your doctor as told. GET HELP RIGHT AWAY IF:   There is more pain or pressure around the injury.  There is numbness, tingling, or pain in the toes or fingers past the injury.  The fingers or toes become cold or blue.  The splint becomes too soft or breaks before the injury is healed. MAKE SURE YOU:   Understand these instructions.  Will watch this condition.  Will get help right away if you are not doing well or get worse. Document Released: 06/18/2008 Document Revised: 12/02/2011 Document Reviewed: 06/18/2008 [facility] Patient Information 2014 [place]ExitCare, [place]LL[place].  RICE: Routine Care for Injuries The routine care of many injuries includes Rest, Ice, Compression, and Elevation (RICE). HOME CARE INSTRUCTIONS  Rest is needed to allow your body to heal. Routine activities can usually be resumed when comfortable. Injured tendons and bones can take up to 6 weeks to heal. Tendons are the cord-like structures that attach muscle to bone.  Ice following an injury helps keep the swelling down and reduces pain.  Put ice in a plastic bag.  Place a towel between your skin and the bag.  Leave the ice on for 15-20 minutes, 03-04 times a day. Do this while awake,  for the first 24 to 48 hours. After that, continue as directed by your caregiver.  Compression helps keep swelling down. It also gives support and helps with discomfort. If an elastic bandage has been applied, it should be removed and reapplied every 3 to 4 hours. It should not be applied tightly, but firmly enough to  keep swelling down. Watch fingers or toes for swelling, bluish discoloration, coldness, numbness, or excessive pain. If any of these problems occur, remove the bandage and reapply loosely. Contact your caregiver if these problems continue.  Elevation helps reduce swelling and decreases pain. With extremities, such as the arms, hands, legs, and feet, the injured area should be placed near or above the level of the heart, if possible. SEEK IMMEDIATE MEDICAL CARE IF:  You have persistent pain and swelling.  You develop redness, numbness, or unexpected weakness.  Your symptoms are getting worse rather than improving after several days. These symptoms may indicate that further evaluation or further X-rays are needed. Sometimes, X-rays may not show a small broken bone (fracture) until 1 week or 10 days later. Make a follow-up appointment with your caregiver. Ask when your X-ray results will be ready. Make sure you get your X-ray results. Document Released: 12/22/2000 Document Revised: 12/02/2011 Document Reviewed: 02/08/2011 [facility] Patient Information 2014 [place], [place].   Emergency Department Resource Guide 1) Find a Doctor and Pay Out of Pocket Although you won't have to find out who is covered by your insurance plan, it is a good idea to ask around and get recommendations. You will then need to call the office and see if the doctor you have chosen will accept you as a new patient and what types of options they offer for patients who are self-pay. Some doctors offer discounts or will set up payment plans for their patients who do not have insurance, but you will need to ask so you aren't surprised when you get to your appointment.  2) Contact Your Local Health Department Not all health departments have doctors that can see patients for sick visits, but many do, so it is worth a call to see if yours does. If you don't know where your local health department is, you can check in your phone book.  The CDC also has a tool to help you locate your state's health department, and many state websites also have listings of all of their local health departments.  3) Find a Walk-in Clinic If your illness is not likely to be very severe or complicated, you may want to try a walk in clinic. These are popping up all over the country in pharmacies, drugstores, and shopping centers. They're usually staffed by nurse practitioners or physician assistants that have been trained to treat common illnesses and complaints. They're usually fairly quick and inexpensive. However, if you have serious medical issues or chronic medical problems, these are probably not your best option.  No Primary Care Doctor: - Call Health Connect at  [phone] - they can help you locate a primary care doctor that  accepts your insurance, provides certain services, etc. - Physician Referral Service- [phone]  Chronic Pain Problems: Organization         Address  Phone   Notes  [facility]  [phone] Patients need to be referred by their primary care doctor.   Medication Assistance: Organization         Address  Phone   Notes  [facility] Medication Assistance Program [address] [place], [place] [address] [phone] --Must be a resident of [facility] -- Must have NO insurance coverage whatsoever (no Medicaid/ Medicare, etc.) -- The pt. MUST have a primary care doctor that directs their care regularly and follows them in the community   [facility]ist  [phone]   [employer]  [phone]    Agencies that provide inexpensive medical care: Organization         Address  Phone   Notes  [facility]  [phone]   [facility]    [phone]   [facility] [address] [place], [place] [address] [phone]   [facility] [address] [place] [place] [phone]   Planned Parenthood     [phone]   [facility]    [phone]   [facility]  [address], [place] Phone:  [phone], Fax:  [phone] Hours of Operation:  9 am - 6 pm, M-F.  Also accepts Medicaid/Medicare and self-pay.  [facility]  [address], [place] Phone: [phone], Fax: [phone]. Hours of Operation:  8:30 am - 5:30 pm, M-F.  Also accepts Medicaid and self-pay.  [facility] [place] [address] [place] Phone: [phone]   [facility] [address] Natasha Bence [place], [place] [phone], Ext. [phone] Mondays & Thursdays: 7-9 AM.  First 15 patients are seen on a first come, first serve basis.    Medicaid-accepting [facility] Providers:  Organization         Address  Phone   Notes  [facility] [address], [place] [phone] Also accepts self-pay patients.  [facility] [address] Laurell Josephs [place], [place]  [phone]   [facility] [address], [place] [phone]   [facility] [address], [place] [phone]   Renaye Rakers [address], [place]   [phone] Only accepts [place] Access [place] patients after they have their name applied to their card.   Self-Pay (no insurance) in [facility]:  Organization         Address  Phone   Notes  Sickle Cell Patients, [facility] [address] [place], [place] [phone]   [facility] [address] [place], [place] [phone]   [facility] [place]  [address], [place] [phone]   [facility]/Dr. Osei-Bonsu  [address] [place] or [address], [place] [phone] Phone number for both [place] and [place] locations is the same.  Urgent Medical and  [facility] [address], [place] [phone]   [facility] [address], [place] or [address] [phone]   [facility] [address] [place] [phone], phone; [phone], fax Sees patients 1st and 3rd Saturday of every month.  Must not qualify for public or private insurance (i.e. Medicaid, Medicare, [place] Health Choice, Veterans' Benefits)  Household income should be no more than 200% of the poverty level The clinic cannot treat you if you are pregnant or think you are pregnant  Sexually transmitted diseases are not treated at the clinic.  Dental Care: Organization         Address  Phone  Notes  [facility] [place] [phone] Accepts children up to age 1 who are enrolled in [place] or [place]; pregnant women with a Medicaid card; and children who have applied for Medicaid or [place] Health Choice, but were declined, whose parents can pay a reduced fee at time of service.  [facility] of [facility]  [address], [place] [phone] Accepts children up to age 39 who are enrolled in [place] or [place]; pregnant women with a Medicaid card; and children who have applied for Medicaid or [place] Health Choice, but were declined, whose parents can pay a reduced fee at time of service.  [facility]  [place] [phone] Patients are seen by appointment only. Walk-ins are not accepted. [place] will see patients 56 years of age and older. Monday - Tuesday (8am-5pm) Most Wednesdays (8:30-5pm) $30 per visit, cash only  [facility] Adult Dental Access PROGRAM  [address], [facility] [phone] Patients are seen by appointment only. Walk-ins are not accepted. [place] will see patients 37 years of age and  older. One Wednesday Evening (Monthly: Volunteer Based).  $30 per visit, cash only  [employer]  [phone] for adults; Children under age 38, call Graduate Pediatric Dentistry at [phone]. Children aged 53-14, please call [phone] to request a pediatric application.  Dental services are provided in all areas of dental care including fillings, crowns and bridges, complete and partial dentures, implants, gum treatment, root canals, and extractions. Preventive care is also provided. Treatment is provided to both adults and children. Patients are selected via a lottery and there is often a waiting list.   [facility] [address], [place]  [phone] [email or web address]   [facility] [address] [place], [place] [phone], Ext. [phone] Second and Fourth Thursday of each month, opens at 6:30 AM; Clinic ends at 9 AM.  Patients are seen on a first-come first-served basis, and a limited number are seen during each clinic.   [facility]  [address] Hillard Danker [place], [place] [phone]   Eligibility Requirements You must have lived in [place], [place], or [place] counties for at least the last three months.   You cannot be eligible for state or federal sponsored [employer], including [employer], [place], or [employer].   You generally cannot be eligible for healthcare insurance through your employer.    How to apply: Eligibility screenings are held every Tuesday and Wednesday afternoon from 1:00 pm until 4:00 pm. You do not need an appointment for the interview!  [facility] [address] [place], [place]   [place]  [place] Department  [place]  [phone]    Behavioral Health Resources in the Community: Intensive Outpatient Programs Organization          Address  Phone  Notes  Tolu Yoe. [address] [place], [place] [phone]   [facility] Outpatient [address], [place], [place]   ADS: Alcohol & Drug Svcs [address] [place], [place]   [place] [address],  [place], [place] or [phone]   Substance Abuse Resources Organization         Address  Phone  Notes  Alcohol and Drug Services  [place]  [phone]   [facility]  [phone]   Chinita Pester  [phone]   Residential & Outpatient Substance Abuse Program  [phone]   Psychological Services Organization         Address  Phone  Notes  [facility] [place]  [place]  [phone]   [place] [address] [place] or [phone]    Mobile Crisis Teams Organization         Address  Phone  Notes  [facility], Mobile Crisis Care Unit  [phone]   [facility]  [address] [place] [address] [place], [place]   [place] Levels [address] [place] [phone]    Self-Help/Support Groups Organization         Address  Phone             Notes  [place]. of [place] - variety of support groups  [place] Call for more information  Narcotics Anonymous (NA), [facility] [address], [employer] [place]  2 meetings at this location   Special educational needs teacher         Address  Phone  Notes  [facility] [place],    [place]  [phone]   [facility]  [address] [place] [phone], [place], [place]   [place], [place] [phone] Admissions: 8am-3pm M-F  Incentives Substance [place] [address],    [place], [place] [phone]   [facility] [address] [place], [place], [place]   [facility] [address],  [place], [place]   [facility] - Intensive Outpatient [address] Kristeen [address], [place], [place]   [facility] [address] [place].,  [place], [place] [phone] or [phone]   [facility] (RTS) [address] [place], [place] Accepts Medicaid  [facility] [place] [address],  [place] [phone] Substance Abuse/Addiction Treatment   [facility] Organization         Address  Phone  Notes  [facility]  [phone]   Domenic Schwab, PhD [address] Arlis Porta [place], [place]   [phone] or [phone]   [place], [place] [phone]   [facility] [address] [place], [place] [phone] Insurance/Medicaid/sponsorship through [facility] and Families [address] [place]                                    [place], [place] [phone] [place] [address], [place] [phone]    Dr. Adele Schilder  [phone]   [facility] Dept. 1) [address], [place] 2) [place] 3)  [place] [address], [place] [phone]  [phone]   [place] [phone] or [phone] (After Hours)

## 2013-10-04 NOTE — ED Notes (Signed)
Abrasion on hand cleansed and bandaid applied.

## 2013-10-04 NOTE — ED Notes (Signed)
Pt tripped over the dog and fell and injured right hand, has swelling and pain over outer portion of hand.

## 2013-10-04 NOTE — ED Provider Notes (Signed)
CSN: [id number]     Arrival date & time 10/04/13  1839 History   First MD Initiated Contact with Patient 10/04/13 1952     Chief Complaint  Patient presents with  . Hand Injury    HPI  Debbie Santana is a 23 y.o. female with a PMH of back surgery who presents to the ED for evaluation of hand injury.  History was provided by the patient.  Patient states that PTA she fell and tripped over her dog and used her right hand to break her fall.  She complains of pain and swelling to the right lateral hand diffusely.  She did not take anything for pain prior to arrival.  No other injuries or trauma.  No numbness/tingling, loss of sensation, or weakness.  She otherwise has been well.  No abdominal pain, back pain, neck pain, nausea, emesis, headache, fever.  Tetanus up to date.     History reviewed. No pertinent past medical history. Past Surgical History  Procedure Laterality Date  . Back surgery     No family history on file. History  Substance Use Topics  . Smoking status: Current Every Day Smoker -- 1.00 packs/day  . Smokeless tobacco: Not on file     Comment: never done snuff or chewing tobacco.  . Alcohol Use: No     Comment: occasionally   OB History   Grav Para Term Preterm Abortions TAB SAB Ect Mult Living   1              Review of Systems  Gastrointestinal: Negative for nausea, vomiting and abdominal pain.  Musculoskeletal: Positive for arthralgias. Negative for back pain, gait problem, myalgias and neck pain.  Skin: Positive for wound.  Neurological: Negative for dizziness, syncope, weakness, light-headedness, numbness and headaches.    Allergies  Review of patient's allergies indicates no known allergies.  Home Medications   Current Outpatient Rx  Name  Route  Sig  Dispense  Refill  . ibuprofen (ADVIL,MOTRIN) 600 MG tablet   Oral   Take 1 tablet (600 mg total) by mouth every 6 (six) hours as needed for pain.   30 tablet   0   . sulfamethoxazole-trimethoprim  (SEPTRA DS) 800-160 MG per tablet   Oral   Take 2 tablets by mouth 2 (two) times daily.   28 tablet   0    BP 129/93  Pulse 98  Temp(Src) 98.5 F (36.9 C) (Oral)  Resp 24  Ht 5\' 9"  (1.753 m)  Wt 197 lb 1.6 oz (89.404 kg)  BMI 29.09 kg/m2  SpO2 97%  LMP 09/16/2013  Filed Vitals:   10/04/13 1943  BP: 129/93  Pulse: 98  Temp: 98.5 F (36.9 C)  TempSrc: Oral  Resp: 24  Height: 5\' 9"  (1.753 m)  Weight: 197 lb 1.6 oz (89.404 kg)  SpO2: 97%    Physical Exam  Nursing note and vitals reviewed. Constitutional: She is oriented to person, place, and time. She appears well-developed and well-nourished. No distress.  HENT:  Head: Normocephalic and atraumatic.  Right Ear: External ear normal.  Left Ear: External ear normal.  Nose: Nose normal.  Eyes: Conjunctivae are normal. Right eye exhibits no discharge. Left eye exhibits no discharge.  Neck: Normal range of motion. Neck supple.  Cardiovascular: Normal rate, regular rhythm and normal heart sounds.  Exam reveals no gallop and no friction rub.   No murmur heard. Radial pulses present and equal bilaterally  Pulmonary/Chest: Effort normal and breath sounds normal.  No respiratory distress. She has no wheezes. She has no rales. She exhibits no tenderness.  Abdominal: Soft. She exhibits no distension. There is no tenderness.  Musculoskeletal: Normal range of motion. She exhibits edema and tenderness.       Hands: Tenderness to palpation to the right 5th/lateral metacarpal with overlying edema and ecchymosis.  Grip strength 5/5.  No right wrist or digit tenderness.     Neurological: She is alert and oriented to person, place, and time.  Sensation intact  Skin: Skin is warm and dry. She is not diaphoretic.  Pinpoint superficial abrasion to the right lateral hand.      ED Course  Procedures (including critical care time) Labs Review Labs Reviewed - No data to display Imaging Review Dg Hand Complete Right  10/04/2013   CLINICAL  DATA:  "Patient tripped and fell over a dog approximately 1 hr ago, injuring the right hand."  EXAM: RIGHT HAND - COMPLETE 3+ VIEW  COMPARISON:  None.  FINDINGS: Comminuted fracture involving the mid shaft of the 5th metacarpal with volar angulation. No other fractures. Well preserved joint spaces. Well preserved bone mineral density.  IMPRESSION: Comminuted fracture involving the mid shaft of the 5th metacarpal with volar angulation.   Electronically Signed   By: Evangeline Dakin M.D.   On: 10/04/2013 20:09    EKG Interpretation   None       DG Hand Complete Right (Final result)  Result time: 10/04/13 20:09:43    Final result by Rad Results In Interface (10/04/13 20:09:43)    Narrative:   CLINICAL DATA: "Patient tripped and fell over a dog approximately 1 hr ago, injuring the right hand."  EXAM: RIGHT HAND - COMPLETE 3+ VIEW  COMPARISON: None.  FINDINGS: Comminuted fracture involving the mid shaft of the 5th metacarpal with volar angulation. No other fractures. Well preserved joint spaces. Well preserved bone mineral density.  IMPRESSION: Comminuted fracture involving the mid shaft of the 5th metacarpal with volar angulation.   Electronically Signed By: Evangeline Dakin M.D. On: 10/04/2013 20:09            MDM   Debbie Santana is a 23 y.o. female with a PMH of back surgery who presents to the ED for evaluation of hand injury.  Patient was found to have a comminuted fracture of the mid-shaft of the 5th metacarpal with volar angulation.  Patient was placed in ulnar gutter splint.  Patient neurovascularly intact.  Patient has superficial pinpoint abrasion which is not an open fx.  Tetanus up to date.  Patient provided with short course of pain medication.  RICE method discussed.  Instructed to follow-up with orthopedics.  Return precautions, discharge instructions, and follow-up was discussed with the patient before discharge.  Visitor in ED to drive patient home.       Discharge Medication List as of 10/04/2013  9:35 PM    START taking these medications   Details  HYDROcodone-acetaminophen (NORCO/VICODIN) 5-325 MG per tablet Take 1-2 tablets by mouth every 4 (four) hours as needed., Starting 10/04/2013, Until Discontinued, Print        Final impressions: 1. Fracture of fifth metacarpal bone of right hand, closed, initial encounter       Mercy Moore PA-C   This patient was discussed with Dr. Kassie Mends, PA-C 10/05/13 1322

## 2013-10-05 NOTE — ED Provider Notes (Signed)
Medical screening examination/treatment/procedure(s) were performed by non-physician practitioner and as supervising physician I was immediately available for consultation/collaboration.  EKG Interpretation   None      Radiologic imaging report reviewed and images by radiography - viewed, by me.  Richarda Blade, MD 10/05/13 [phone]

## 2013-10-06 ENCOUNTER — Ambulatory Visit (INDEPENDENT_AMBULATORY_CARE_PROVIDER_SITE_OTHER): Payer: Self-pay | Admitting: Orthopedic Surgery

## 2013-10-06 ENCOUNTER — Encounter: Payer: Self-pay | Admitting: Orthopedic Surgery

## 2013-10-06 VITALS — BP 122/82 | Ht 70.0 in | Wt 197.0 lb

## 2013-10-06 DIAGNOSIS — S6291XA Unspecified fracture of right wrist and hand, initial encounter for closed fracture: Secondary | ICD-10-CM

## 2013-10-06 DIAGNOSIS — S62309A Unspecified fracture of unspecified metacarpal bone, initial encounter for closed fracture: Secondary | ICD-10-CM

## 2013-10-06 DIAGNOSIS — S62329A Displaced fracture of shaft of unspecified metacarpal bone, initial encounter for closed fracture: Secondary | ICD-10-CM | POA: Insufficient documentation

## 2013-10-06 MED ORDER — HYDROCODONE-ACETAMINOPHEN 5-325 MG PO TABS: 1.0000 | ORAL_TABLET | ORAL | Status: DC | PRN

## 2013-10-06 NOTE — Patient Instructions (Signed)
Keep  Cast dry   Do not get wet   If it gets wet dry with a hair dryer on low setting and call the office   

## 2013-10-06 NOTE — Progress Notes (Signed)
   Subjective:    Patient ID: Debbie Santana, female    DOB: June 24, 1991, 24 y.o.   MRN: [id number]  Hand Pain  The incident occurred 12 to 24 hours ago. The incident occurred at home. The injury mechanism was a direct blow. The pain is present in the right hand. The quality of the pain is described as aching. The pain is severe. The pain has been constant since the incident. Associated symptoms include numbness.      Review of Systems  Neurological: Positive for numbness.  All other systems reviewed and are negative.       Objective:   Physical Exam  Nursing note and vitals reviewed. Constitutional: She is oriented to person, place, and time. She appears well-developed and well-nourished.  HENT:  Head: Normocephalic.  Cardiovascular: Normal rate and intact distal pulses.   Neurological: She is alert and oriented to person, place, and time. She exhibits normal muscle tone. Coordination normal.  Skin: Skin is warm and dry. No erythema.  Psychiatric: She has a normal mood and affect. Her behavior is normal. Judgment and thought content normal.  Right Hand Exam   Tenderness  The patient is experiencing tenderness in the dorsal area.  Range of Motion   Wrist  Extension: abnormal  Flexion: abnormal  Pronation: abnormal  Supination: abnormal   Muscle Strength  Right wrist normal muscle strength: CNA pain   Other  Erythema: absent Scars: absent Sensation: normal Pulse: present  Comments:  No rotational deformity when compared to left hand    Left Hand Exam  Left hand exam is normal.   Right Elbow Exam  Right elbow exam is normal.   Right Shoulder Exam  Right shoulder exam is normal.            Assessment & Plan:  xrays mid shaft 5th mtc fracture of the right hand with 45 angulation   i applied a clam digger splint   Advised x rays in 2 week in plaster

## 2013-10-21 ENCOUNTER — Ambulatory Visit (INDEPENDENT_AMBULATORY_CARE_PROVIDER_SITE_OTHER): Payer: Self-pay

## 2013-10-21 ENCOUNTER — Ambulatory Visit (INDEPENDENT_AMBULATORY_CARE_PROVIDER_SITE_OTHER): Payer: Self-pay | Admitting: Orthopedic Surgery

## 2013-10-21 VITALS — BP 124/74 | Ht 70.0 in | Wt 197.0 lb

## 2013-10-21 DIAGNOSIS — S62329A Displaced fracture of shaft of unspecified metacarpal bone, initial encounter for closed fracture: Secondary | ICD-10-CM

## 2013-10-21 DIAGNOSIS — S62309A Unspecified fracture of unspecified metacarpal bone, initial encounter for closed fracture: Secondary | ICD-10-CM

## 2013-10-21 DIAGNOSIS — S6291XA Unspecified fracture of right wrist and hand, initial encounter for closed fracture: Secondary | ICD-10-CM

## 2013-10-21 MED ORDER — HYDROCODONE-ACETAMINOPHEN 5-325 MG PO TABS
1.0000 | ORAL_TABLET | ORAL | Status: DC | PRN
Start: 2013-10-21 — End: 2014-01-04

## 2013-10-21 NOTE — Patient Instructions (Signed)
X-rays again in 4 weeks

## 2013-10-21 NOTE — Progress Notes (Signed)
Patient ID: Debbie Santana, female   DOB: [birth year], 23 y.o.   MRN: [id number]  Chief Complaint  Patient presents with  . Follow-up    2 week recheck right boxers fracture with xray, date of injury January 12    Patient presents after boxers fracture with actually midshaft fracture fifth metacarpal. Uninsured. Cannot afford anesthesia cost.  Placing clamdigger splint  Repeat x-rays  AP lateral oblique right hand in splint  Overall alignment on the AP looks good, she is slightly angulated fracture on the lateral and oblique view.  Impression slightly angulated right fifth metacarpal fracture  New gutter splint applied

## 2013-10-25 ENCOUNTER — Encounter (HOSPITAL_COMMUNITY): Payer: Self-pay | Admitting: Emergency Medicine

## 2013-10-25 ENCOUNTER — Emergency Department (HOSPITAL_COMMUNITY): Payer: Self-pay

## 2013-10-25 ENCOUNTER — Emergency Department (HOSPITAL_COMMUNITY)
Admission: EM | Admit: 2013-10-25 | Discharge: 2013-10-25 | Disposition: A | Discharge status: Home Health | Payer: Self-pay | Attending: Emergency Medicine | Admitting: Emergency Medicine

## 2013-10-25 DIAGNOSIS — R0789 Other chest pain: Secondary | ICD-10-CM | POA: Insufficient documentation

## 2013-10-25 DIAGNOSIS — F17200 Nicotine dependence, unspecified, uncomplicated: Secondary | ICD-10-CM | POA: Insufficient documentation

## 2013-10-25 DIAGNOSIS — Z3202 Encounter for pregnancy test, result negative: Secondary | ICD-10-CM | POA: Insufficient documentation

## 2013-10-25 DIAGNOSIS — M25519 Pain in unspecified shoulder: Secondary | ICD-10-CM | POA: Insufficient documentation

## 2013-10-25 DIAGNOSIS — R112 Nausea with vomiting, unspecified: Secondary | ICD-10-CM | POA: Insufficient documentation

## 2013-10-25 DIAGNOSIS — R1013 Epigastric pain: Secondary | ICD-10-CM | POA: Insufficient documentation

## 2013-10-25 LAB — COMPREHENSIVE METABOLIC PANEL
ALK PHOS: 53 U/L (ref 39–117)
ALT: 16 U/L (ref 0–35)
AST: 17 U/L (ref 0–37)
Albumin: 3.9 g/dL (ref 3.5–5.2)
BUN: 9 mg/dL (ref 6–23)
CALCIUM: 9.7 mg/dL (ref 8.4–10.5)
CO2: 26 meq/L (ref 19–32)
Chloride: 103 mEq/L (ref 96–112)
Creatinine, Ser: 0.87 mg/dL (ref 0.50–1.10)
GLUCOSE: 107 mg/dL — AB (ref 70–99)
POTASSIUM: 3.5 meq/L — AB (ref 3.7–5.3)
SODIUM: 142 meq/L (ref 137–147)
Total Bilirubin: 0.7 mg/dL (ref 0.3–1.2)
Total Protein: 7.4 g/dL (ref 6.0–8.3)

## 2013-10-25 LAB — URINALYSIS, ROUTINE W REFLEX MICROSCOPIC
GLUCOSE, UA: NEGATIVE mg/dL
HGB URINE DIPSTICK: NEGATIVE
Ketones, ur: NEGATIVE mg/dL
Nitrite: NEGATIVE
PH: 6 (ref 5.0–8.0)
Protein, ur: 30 mg/dL — AB
UROBILINOGEN UA: 0.2 mg/dL (ref 0.0–1.0)

## 2013-10-25 LAB — CBC WITH DIFFERENTIAL/PLATELET
Basophils Absolute: 0 10*3/uL (ref 0.0–0.1)
Basophils Relative: 0 % (ref 0–1)
EOS ABS: 0.1 10*3/uL (ref 0.0–0.7)
EOS PCT: 1 % (ref 0–5)
HEMATOCRIT: 40.5 % (ref 36.0–46.0)
HEMOGLOBIN: 14.1 g/dL (ref 12.0–15.0)
LYMPHS ABS: 2.8 10*3/uL (ref 0.7–4.0)
LYMPHS PCT: 29 % (ref 12–46)
MCH: 32 pg (ref 26.0–34.0)
MCHC: 34.8 g/dL (ref 30.0–36.0)
MCV: 91.8 fL (ref 78.0–100.0)
MONOS PCT: 6 % (ref 3–12)
Monocytes Absolute: 0.6 10*3/uL (ref 0.1–1.0)
Neutro Abs: 6.3 10*3/uL (ref 1.7–7.7)
Neutrophils Relative %: 64 % (ref 43–77)
PLATELETS: 251 10*3/uL (ref 150–400)
RBC: 4.41 MIL/uL (ref 3.87–5.11)
RDW: 12.4 % (ref 11.5–15.5)
WBC: 9.8 10*3/uL (ref 4.0–10.5)

## 2013-10-25 LAB — URINE MICROSCOPIC-ADD ON

## 2013-10-25 LAB — LIPASE, BLOOD: LIPASE: 26 U/L (ref 11–59)

## 2013-10-25 LAB — D-DIMER, QUANTITATIVE: D-Dimer, Quant: 0.36 ug/mL-FEU (ref 0.00–0.48)

## 2013-10-25 LAB — PREGNANCY, URINE: Preg Test, Ur: NEGATIVE

## 2013-10-25 LAB — POCT PREGNANCY, URINE: PREG TEST UR: NEGATIVE

## 2013-10-25 MED ORDER — SODIUM CHLORIDE 0.9 % IV BOLUS (SEPSIS)
1000.0000 mL | Freq: Once | INTRAVENOUS | Status: AC
  Administered 2013-10-25: 1000 mL via INTRAVENOUS

## 2013-10-25 MED ORDER — ONDANSETRON HCL 4 MG/2ML IJ SOLN
4.0000 mg | Freq: Once | INTRAMUSCULAR | Status: AC
  Administered 2013-10-25: 4 mg via INTRAVENOUS
  Filled 2013-10-25: qty 2

## 2013-10-25 MED ORDER — ONDANSETRON HCL 4 MG PO TABS: 4.0000 mg | ORAL_TABLET | Freq: Four times a day (QID) | ORAL | Status: DC

## 2013-10-25 NOTE — ED Notes (Signed)
Pt took about 3 sips of sprite. Tolerated but does not wish to drink anymore,  " I don't have any desire to drink any more" resting in bed with nad. Significant other at bsd.

## 2013-10-25 NOTE — ED Provider Notes (Signed)
CSN: [id number]     Arrival date & time 10/25/13  0814 History   First MD Initiated Contact with Patient 10/25/13 [phone]    This chart was scribed for Ezequiel Essex, MD by Era Bumpers, ED scribe. This patient was seen in room APA19/AP[facility]19 and the patient's care was started at 0824.  Chief Complaint  Patient presents with  . Nausea  . Chest Pain   The history is provided by the patient. No language interpreter was used.   HPI Comments: Debbie Santana is a 23 y.o. female who presents to the Emergency Department complaining of nausea and emesis episodes, onset last PM. She states emesis episodes began just after taking hydrocodone and having a few beers while watching football last PM; she has hydrocodone for a fx right hand. She reports associated epigastric abdominal pain. Denies eating greasy foods. No previous similar episodes. She denies diarrhea, fever or dysuria. LNMP 17 days ago. No hx of appendectomy or cholecystectomy.   She also reports sharp, intermittent CP when she breathes yesterday, onset yesteday AM before her nausea/emesis episodes began. She states CP radiates to her posterior left shoulder and is worse w/movement and rotation of her trunk. She takes a BC pill. She denies any recent falls or injury. Nothing makes the pain better.   No allergies to medicines.  History reviewed. No pertinent past medical history. Past Surgical History  Procedure Laterality Date  . Back surgery     No family history on file. History  Substance Use Topics  . Smoking status: Current Every Day Smoker -- 1.00 packs/day  . Smokeless tobacco: Not on file     Comment: never done snuff or chewing tobacco.  . Alcohol Use: No     Comment: occasionally   OB History   Grav Para Term Preterm Abortions TAB SAB Ect Mult Living   1              Review of Systems  Constitutional: Negative for fever and chills.  HENT: Negative for congestion and rhinorrhea.   Respiratory: Negative for cough and  shortness of breath.   Cardiovascular: Positive for chest pain.  Gastrointestinal: Positive for nausea, vomiting and abdominal pain. Negative for diarrhea.  Genitourinary: Negative for dysuria.  Musculoskeletal: Negative for back pain.       Left shoulder pain  Skin: Negative for color change and rash.  Neurological: Negative for syncope.  All other systems reviewed and are negative.  A complete 10 system review of systems was obtained and all systems are negative except as noted in the HPI and PMH.    Allergies  Review of patient's allergies indicates no known allergies.  Home Medications   Current Outpatient Rx  Name  Route  Sig  Dispense  Refill  . HYDROcodone-acetaminophen (NORCO/VICODIN) 5-325 MG per tablet   Oral   Take 1 tablet by mouth every 4 (four) hours as needed.   90 tablet   0   . ibuprofen (ADVIL,MOTRIN) 600 MG tablet   Oral   Take 1 tablet (600 mg total) by mouth every 6 (six) hours as needed for pain.   30 tablet   0   . ranitidine (ZANTAC) 150 MG tablet   Oral   Take 300 mg by mouth 2 (two) times daily as needed for heartburn.         . ondansetron (ZOFRAN) 4 MG tablet   Oral   Take 1 tablet (4 mg total) by mouth every 6 (six) hours.  12 tablet   0    Triage Vitals: BP 107/74  Pulse 97  Temp(Src) 97.3 F (36.3 C) (Oral)  Resp 16  SpO2 98%  LMP 09/16/2013 Physical Exam  Nursing note and vitals reviewed. Constitutional: She is oriented to person, place, and time. She appears well-developed and well-nourished. No distress.  Appears uncomfortable   HENT:  Head: Normocephalic and atraumatic.  Eyes: Conjunctivae are normal. Right eye exhibits no discharge. Left eye exhibits no discharge.  Neck: Normal range of motion.  Cardiovascular: Normal rate.   Pulmonary/Chest: Effort normal. No respiratory distress.  Abdominal: Soft. She exhibits no distension. There is tenderness.  Mild epigastric tenderness No RUQ tenderness   Musculoskeletal:  Normal range of motion. She exhibits tenderness. She exhibits no edema.  Reproducible left scapular pain worse w/movement  Neurological: She is alert and oriented to person, place, and time.  Skin: Skin is warm and dry.  Psychiatric: She has a normal mood and affect. Thought content normal.    ED Course  Procedures (including critical care time) DIAGNOSTIC STUDIES: Oxygen Saturation is 98% on room air, normal by my interpretation.    COORDINATION OF CARE: At 840 AM Discussed treatment plan with patient which includes IV fluids, nausea medicine, blood work, CXR, UA and EKG. Patient agrees.   Labs Review Labs Reviewed  URINALYSIS, ROUTINE W REFLEX MICROSCOPIC - Abnormal; Notable for the following:    Specific Gravity, Urine >1.030 (*)    Bilirubin Urine SMALL (*)    Protein, ur 30 (*)    Leukocytes, UA TRACE (*)    All other components within normal limits  COMPREHENSIVE METABOLIC PANEL - Abnormal; Notable for the following:    Potassium 3.5 (*)    Glucose, Bld 107 (*)    All other components within normal limits  URINE MICROSCOPIC-ADD ON - Abnormal; Notable for the following:    Squamous Epithelial / LPF MANY (*)    Bacteria, UA FEW (*)    All other components within normal limits  PREGNANCY, URINE  CBC WITH DIFFERENTIAL  LIPASE, BLOOD  D-DIMER, QUANTITATIVE  POCT PREGNANCY, URINE   Imaging Review Dg Chest 2 View  10/25/2013   CLINICAL DATA:  Nausea and left-sided chest pain.  EXAM: CHEST  2 VIEW  COMPARISON:  DG CHEST 2 VIEW dated 09/30/2009; DG CHEST 2 VIEW dated 11/19/2006  FINDINGS: The heart size and mediastinal contours are normal. The lungs are clear. There is no pleural effusion or pneumothorax. No acute osseous findings are identified.  IMPRESSION: Stable chest.  No active cardiopulmonary process.   Electronically Signed   By: Camie Patience M.D.   On: 10/25/2013 10:05    EKG Interpretation    Date/Time:  Monday October 25 2013 08:32:23 EST Ventricular Rate:  72 PR  Interval:  118 QRS Duration: 68 QT Interval:  382 QTC Calculation: 418 R Axis:   60 Text Interpretation:  Normal sinus rhythm with sinus arrhythmia Normal ECG No previous ECGs available No previous ECGs available Confirmed by Ahmed Inniss  MD, French Kendra ([id number]) on 10/25/2013 8:39:59 AM            MDM   1. Nausea and vomiting      Nausea and vomiting that started last night after drinking alcohol. She complains of epigastric pain and pain in her left scapula that is worse with movement. Denies any abdominal pain, fevers or diarrhea.  Abdomen is soft. No right upper quadrant tenderness.  LFTs are normal. Lipase is normal. Urinalysis is negative. No  RUQ tenderness.  Patient's was given IV fluids antiemetics. She is tolerating by mouth in ED. Suspect vomiting secondary to alcohol use. Her scapular pain appears to musculoskeletal source with movement. D-dimer and chest x-ray are negative. EKG nonischemic.  Labs unremarkable. Tolerating PO in the ED.  Will discharge with antiemetics and return precautions.     I personally performed the services described in this documentation, which was scribed in my presence. The recorded information has been reviewed and is accurate.      Ezequiel Essex, MD 10/25/13 [phone]

## 2013-10-25 NOTE — ED Notes (Signed)
Sprite given to pt

## 2013-10-25 NOTE — Discharge Instructions (Signed)

## 2013-10-25 NOTE — ED Notes (Signed)
Reports taking a hydrocodone and drinking a beer last evening around 10pm. C/o nausea and left sided cp that radiates to shoulder as well. Pt states her nausea actually started several days ago with decreased appetite.

## 2013-10-26 ENCOUNTER — Telehealth: Payer: Self-pay | Admitting: *Deleted

## 2013-10-26 NOTE — Telephone Encounter (Signed)
Call received from patient stating she had just woken up and noticed right hand was very swollen, tight and numb. She stated some of the wrapping had come off the splint, and she reapplied it. I advised her to make sure that wasn't too tight. I advised that Dr. Aline Brochure was not in the office this afternoon, but for her to come in at 8:45 am in the morning 10/27/13 to have it checked. I advised to elevate and ice, and if symptoms worsened go to ER.

## 2013-10-27 ENCOUNTER — Ambulatory Visit (INDEPENDENT_AMBULATORY_CARE_PROVIDER_SITE_OTHER): Payer: Self-pay | Admitting: Orthopedic Surgery

## 2013-10-27 ENCOUNTER — Encounter: Payer: Self-pay | Admitting: Orthopedic Surgery

## 2013-10-27 VITALS — BP 125/43 | Ht 70.0 in | Wt 197.0 lb

## 2013-10-27 DIAGNOSIS — S62329A Displaced fracture of shaft of unspecified metacarpal bone, initial encounter for closed fracture: Secondary | ICD-10-CM

## 2013-10-27 NOTE — Progress Notes (Signed)
Patient ID: Debbie Santana, female   DOB: 09-15-1991, 23 y.o.   MRN: [id number] Chief Complaint  Patient presents with  . Follow-up    right hand swelling and painful    BP 125/43  Ht 5\' 10"  (1.778 m)  Wt 197 lb (89.359 kg)  BMI 28.27 kg/m2  LMP 09/16/2013   Splint changed  No problems seen  Emphasize elevation   Return in 2 weeks

## 2013-11-01 ENCOUNTER — Encounter: Payer: Self-pay | Admitting: Orthopedic Surgery

## 2013-11-11 ENCOUNTER — Ambulatory Visit: Payer: Medicaid Other | Admitting: Orthopedic Surgery

## 2013-11-15 ENCOUNTER — Ambulatory Visit (INDEPENDENT_AMBULATORY_CARE_PROVIDER_SITE_OTHER): Payer: Self-pay | Admitting: Orthopedic Surgery

## 2013-11-15 ENCOUNTER — Ambulatory Visit (INDEPENDENT_AMBULATORY_CARE_PROVIDER_SITE_OTHER): Payer: Medicaid Other

## 2013-11-15 VITALS — BP 116/94 | Ht 70.0 in | Wt 197.0 lb

## 2013-11-15 DIAGNOSIS — S62609A Fracture of unspecified phalanx of unspecified finger, initial encounter for closed fracture: Secondary | ICD-10-CM

## 2013-11-15 DIAGNOSIS — S62329A Displaced fracture of shaft of unspecified metacarpal bone, initial encounter for closed fracture: Secondary | ICD-10-CM

## 2013-11-15 NOTE — Progress Notes (Signed)
Patient ID: Debbie Santana, female   DOB: 1990-10-13, 24 y.o.   MRN: [id number]  Chief Complaint  Patient presents with  . Follow-up    2 week recheck right hand fracture with xray DOI 10/04/13    Encounter Diagnoses  Name Primary?  . Closed fracture of shaft of metacarpal bone(s) Yes  . Fracture of metacarpal shaft of right hand, closed     BP 116/94  Ht 5\' 10"  (1.778 m)  Wt 197 lb (89.359 kg)  BMI 28.27 kg/m2  LMP 02/04/2013  6 weeks status post mid shaft fifth metacarpal fracture  Treated with splints  The patient is placed in a cock-up splint and encouraged and instructed on how to perform in the joint flexion exercises followup in 6 weeks for x-ray right hand

## 2013-11-18 ENCOUNTER — Ambulatory Visit: Payer: Medicaid Other | Admitting: Orthopedic Surgery

## 2014-01-04 ENCOUNTER — Encounter: Payer: Self-pay | Admitting: Orthopedic Surgery

## 2014-01-04 ENCOUNTER — Ambulatory Visit (INDEPENDENT_AMBULATORY_CARE_PROVIDER_SITE_OTHER): Payer: Medicaid Other

## 2014-01-04 ENCOUNTER — Ambulatory Visit (INDEPENDENT_AMBULATORY_CARE_PROVIDER_SITE_OTHER): Payer: Self-pay | Admitting: Orthopedic Surgery

## 2014-01-04 VITALS — BP 137/86 | Ht 70.0 in | Wt 197.0 lb

## 2014-01-04 DIAGNOSIS — S62609A Fracture of unspecified phalanx of unspecified finger, initial encounter for closed fracture: Secondary | ICD-10-CM

## 2014-01-04 DIAGNOSIS — S6291XA Unspecified fracture of right wrist and hand, initial encounter for closed fracture: Secondary | ICD-10-CM

## 2014-01-04 DIAGNOSIS — S62309A Unspecified fracture of unspecified metacarpal bone, initial encounter for closed fracture: Secondary | ICD-10-CM

## 2014-01-04 MED ORDER — HYDROCODONE-ACETAMINOPHEN 5-325 MG PO TABS: 1.0000 | ORAL_TABLET | ORAL | Status: DC | PRN

## 2014-01-04 NOTE — Patient Instructions (Signed)
Continue hand exercises

## 2014-01-04 NOTE — Progress Notes (Signed)
Patient ID: Debbie Santana, female   DOB: 18-Nov-1990, 23 y.o.   MRN: [id number]  Chief Complaint  Patient presents with  . Follow-up    6 week recheck on right hand fracture with xray. DOI 10/04/13    Fracture followup the patient has made excellent progress x-rays show healing of the fracture although there is a slight persistent fracture line there is callus noted at 3 months out. No pain at the fracture site. Rotational alignment is equal to the opposite hand and she can make a 95% of a full fist  Recommend continue and exercises followup with [place] as needed  Meds ordered this encounter  Medications  . HYDROcodone-acetaminophen (NORCO/VICODIN) 5-325 MG per tablet    Sig: Take 1 tablet by mouth every 4 (four) hours as needed.    Dispense:  40 tablet    Refill:  0    Order Specific Question:  Supervising Provider    Answer:  Noemi Chapel D [[id number]]

## 2014-01-19 ENCOUNTER — Encounter (HOSPITAL_COMMUNITY): Payer: Self-pay | Admitting: Emergency Medicine

## 2014-01-19 ENCOUNTER — Emergency Department (HOSPITAL_COMMUNITY)
Admission: EM | Admit: 2014-01-19 | Discharge: 2014-01-20 | Disposition: A | Discharge status: Home / Self Care | Payer: Medicaid Other | Attending: Emergency Medicine | Admitting: Emergency Medicine

## 2014-01-19 DIAGNOSIS — Z79899 Other long term (current) drug therapy: Secondary | ICD-10-CM | POA: Insufficient documentation

## 2014-01-19 DIAGNOSIS — E860 Dehydration: Secondary | ICD-10-CM | POA: Insufficient documentation

## 2014-01-19 DIAGNOSIS — F17200 Nicotine dependence, unspecified, uncomplicated: Secondary | ICD-10-CM | POA: Insufficient documentation

## 2014-01-19 DIAGNOSIS — Z3202 Encounter for pregnancy test, result negative: Secondary | ICD-10-CM | POA: Insufficient documentation

## 2014-01-19 DIAGNOSIS — Z791 Long term (current) use of non-steroidal anti-inflammatories (NSAID): Secondary | ICD-10-CM | POA: Insufficient documentation

## 2014-01-19 DIAGNOSIS — R51 Headache: Secondary | ICD-10-CM | POA: Insufficient documentation

## 2014-01-19 DIAGNOSIS — K029 Dental caries, unspecified: Secondary | ICD-10-CM | POA: Insufficient documentation

## 2014-01-19 DIAGNOSIS — R55 Syncope and collapse: Secondary | ICD-10-CM

## 2014-01-19 LAB — CBC WITH DIFFERENTIAL/PLATELET
BASOS ABS: 0 10*3/uL (ref 0.0–0.1)
BASOS PCT: 0 % (ref 0–1)
Eosinophils Absolute: 0.1 10*3/uL (ref 0.0–0.7)
Eosinophils Relative: 1 % (ref 0–5)
HCT: 43.5 % (ref 36.0–46.0)
Hemoglobin: 15 g/dL (ref 12.0–15.0)
LYMPHS PCT: 34 % (ref 12–46)
Lymphs Abs: 2.9 10*3/uL (ref 0.7–4.0)
MCH: 32.1 pg (ref 26.0–34.0)
MCHC: 34.5 g/dL (ref 30.0–36.0)
MCV: 93.1 fL (ref 78.0–100.0)
Monocytes Absolute: 0.6 10*3/uL (ref 0.1–1.0)
Monocytes Relative: 7 % (ref 3–12)
Neutro Abs: 5.1 10*3/uL (ref 1.7–7.7)
Neutrophils Relative %: 58 % (ref 43–77)
PLATELETS: 214 10*3/uL (ref 150–400)
RBC: 4.67 MIL/uL (ref 3.87–5.11)
RDW: 12.5 % (ref 11.5–15.5)
WBC: 8.7 10*3/uL (ref 4.0–10.5)

## 2014-01-19 LAB — POC URINE PREG, ED: Preg Test, Ur: NEGATIVE

## 2014-01-19 MED ORDER — HYDROCODONE-ACETAMINOPHEN 5-325 MG PO TABS
1.0000 | ORAL_TABLET | Freq: Once | ORAL | Status: AC
  Administered 2014-01-20: 1 via ORAL
  Filled 2014-01-19: qty 1

## 2014-01-19 NOTE — ED Notes (Signed)
Pt reports dizziness and sudden weakness about 10 this morning.  Pt reports having a tooth abscess which she does have antibiotics for but has not started.  Reporting decreased appetite so has not been eating or drinking much for past couple days.

## 2014-01-19 NOTE — ED Notes (Signed)
Family at bedside. Patient states that she is has a headache and feeling weak in her extremities.

## 2014-01-20 LAB — URINALYSIS, ROUTINE W REFLEX MICROSCOPIC
BILIRUBIN URINE: NEGATIVE
Glucose, UA: NEGATIVE mg/dL
Hgb urine dipstick: NEGATIVE
Ketones, ur: NEGATIVE mg/dL
Leukocytes, UA: NEGATIVE
NITRITE: NEGATIVE
Protein, ur: NEGATIVE mg/dL
Specific Gravity, Urine: >1.03 — ABNORMAL HIGH (ref 1.005–1.030)
UROBILINOGEN UA: 0.2 mg/dL (ref 0.0–1.0)
pH: 6 (ref 5.0–8.0)

## 2014-01-20 LAB — BASIC METABOLIC PANEL
BUN: 8 mg/dL (ref 6–23)
CO2: 26 mEq/L (ref 19–32)
Calcium: 9.9 mg/dL (ref 8.4–10.5)
Chloride: 101 mEq/L (ref 96–112)
Creatinine, Ser: 0.86 mg/dL (ref 0.50–1.10)
Glucose, Bld: 93 mg/dL (ref 70–99)
Potassium: 4.1 mEq/L (ref 3.7–5.3)
SODIUM: 139 meq/L (ref 137–147)

## 2014-01-20 MED ORDER — PROMETHAZINE HCL 25 MG PO TABS: 25.0000 mg | ORAL_TABLET | Freq: Four times a day (QID) | ORAL | Status: DC | PRN

## 2014-01-20 MED ORDER — PROMETHAZINE HCL 12.5 MG PO TABS
25.0000 mg | ORAL_TABLET | Freq: Once | ORAL | Status: AC
  Administered 2014-01-20: 25 mg via ORAL
  Filled 2014-01-20: qty 2

## 2014-01-20 NOTE — ED Provider Notes (Signed)
CSN: [id number]     Arrival date & time 01/19/14  2007 History   First MD Initiated Contact with Patient 01/19/14 2234     Chief Complaint  Patient presents with  . Weakness     (Consider location/radiation/quality/duration/timing/severity/associated sxs/prior Treatment) HPI Comments: Debbie Santana is a 23 y.o. Female presenting with an episode of syncope this am.  She describes being indoors having a friend give her a manicure when she became hot, so walked outside,  She proceeded to become lightheaded, her vision and hearing faded, then she passed out, landing on grass.  She had not had anything to eat this morning prior to the event, and ate a candy bar afterward and felt some better.  She has been having generalized fatigue and along with frontal headache and nausea for the past few days and has had decreased appetite and reduced oral intake for the past several days as well.  She denies neck pain or stiffness, focal weakness, photophobia or other visual changes.  She has a large cavity in a tooth for which she is supposed to be on antibiotics prescribed by her dentist but has not started taking this yet.  She denies fevers and mouth or gingival swelling.  She denies nausea, vomiting, abdominal pain, chest pain or shortness of breath.     The history is provided by the patient.    History reviewed. No pertinent past medical history. Past Surgical History  Procedure Laterality Date  . Back surgery     History reviewed. No pertinent family history. History  Substance Use Topics  . Smoking status: Current Every Day Smoker -- 1.00 packs/day  . Smokeless tobacco: Not on file     Comment: never done snuff or chewing tobacco.  . Alcohol Use: No     Comment: occasionally   OB History   Grav Para Term Preterm Abortions TAB SAB Ect Mult Living   1              Review of Systems  Constitutional: Positive for fatigue. Negative for fever.  HENT: Negative for congestion and sore throat.    Eyes: Negative.   Respiratory: Negative for chest tightness and shortness of breath.   Cardiovascular: Negative for chest pain, palpitations and leg swelling.  Gastrointestinal: Negative for nausea and abdominal pain.  Genitourinary: Negative.   Musculoskeletal: Negative for arthralgias, joint swelling, neck pain and neck stiffness.  Skin: Negative.  Negative for rash and wound.  Neurological: Positive for syncope and headaches. Negative for dizziness, weakness, light-headedness and numbness.  Psychiatric/Behavioral: Negative.       Allergies  Review of patient's allergies indicates no known allergies.  Home Medications   Prior to Admission medications   Medication Sig Start Date End Date Taking? Authorizing Provider  HYDROcodone-acetaminophen (NORCO/VICODIN) 5-325 MG per tablet Take 1 tablet by mouth every 4 (four) hours as needed. 01/04/14   Carole Civil, MD  ibuprofen (ADVIL,MOTRIN) 600 MG tablet Take 1 tablet (600 mg total) by mouth every 6 (six) hours as needed for pain. 06/15/13   Evalee Jefferson, PA-C  ondansetron (ZOFRAN) 4 MG tablet Take 1 tablet (4 mg total) by mouth every 6 (six) hours. 10/25/13   Ezequiel Essex, MD  ranitidine (ZANTAC) 150 MG tablet Take 300 mg by mouth 2 (two) times daily as needed for heartburn.    Historical Provider, MD   BP 118/80  Pulse 75  Temp(Src) 98 F (36.7 C) (Oral)  Resp 24  Wt 185 lb (83.915 kg)  SpO2 99%  LMP 12/31/2013 Physical Exam  Nursing note and vitals reviewed. Constitutional: She is oriented to person, place, and time. She appears well-developed and well-nourished.  HENT:  Head: Normocephalic and atraumatic.  Right Ear: Tympanic membrane normal.  Left Ear: Tympanic membrane normal.  Mouth/Throat: Oropharynx is clear and moist.    Decay in left upper lateral incisor tooth, no visible infection or abscess   Eyes: Conjunctivae and EOM are normal. Pupils are equal, round, and reactive to light. Right eye exhibits no  nystagmus. Left eye exhibits no nystagmus.  Neck: Normal range of motion. Neck supple. No spinous process tenderness present.  Cardiovascular: Normal rate, regular rhythm, S1 normal, normal heart sounds and intact distal pulses.   No murmur heard. Pulmonary/Chest: Effort normal and breath sounds normal. She has no decreased breath sounds. She has no wheezes. She has no rhonchi.  Abdominal: Soft. Bowel sounds are normal. There is no tenderness.  Musculoskeletal: Normal range of motion.  Lymphadenopathy:    She has no cervical adenopathy.  Neurological: She is alert and oriented to person, place, and time. She has normal strength. No sensory deficit. Gait normal. GCS eye subscore is 4. GCS verbal subscore is 5. GCS motor subscore is 6.  Normal heel-shin, normal rapid alternating movements. Cranial nerves III-XII intact.  No pronator drift.  Skin: Skin is warm and dry. No rash noted.  Psychiatric: She has a normal mood and affect. Her speech is normal and behavior is normal. Thought content normal. Cognition and memory are normal.    ED Course  Procedures (including critical care time) Labs Review Labs Reviewed  URINALYSIS, ROUTINE W REFLEX MICROSCOPIC - Abnormal; Notable for the following:    Specific Gravity, Urine >1.030 (*)    All other components within normal limits  CBC WITH DIFFERENTIAL  BASIC METABOLIC PANEL  POC URINE PREG, ED   Results for orders placed during the hospital encounter of 01/19/14  URINALYSIS, ROUTINE W REFLEX MICROSCOPIC      Result Value Ref Range   Color, Urine YELLOW  YELLOW   APPearance CLEAR  CLEAR   Specific Gravity, Urine >1.030 (*) 1.005 - 1.030   pH 6.0  5.0 - 8.0   Glucose, UA NEGATIVE  NEGATIVE mg/dL   Hgb urine dipstick NEGATIVE  NEGATIVE   Bilirubin Urine NEGATIVE  NEGATIVE   Ketones, ur NEGATIVE  NEGATIVE mg/dL   Protein, ur NEGATIVE  NEGATIVE mg/dL   Urobilinogen, UA 0.2  0.0 - 1.0 mg/dL   Nitrite NEGATIVE  NEGATIVE   Leukocytes, UA  NEGATIVE  NEGATIVE  CBC WITH DIFFERENTIAL      Result Value Ref Range   WBC 8.7  4.0 - 10.5 K/uL   RBC 4.67  3.87 - 5.11 MIL/uL   Hemoglobin 15.0  12.0 - 15.0 g/dL   HCT 43.5  36.0 - 46.0 %   MCV 93.1  78.0 - 100.0 fL   MCH 32.1  26.0 - 34.0 pg   MCHC 34.5  30.0 - 36.0 g/dL   RDW 12.5  11.5 - 15.5 %   Platelets 214  150 - 400 K/uL   Neutrophils Relative % 58  43 - 77 %   Neutro Abs 5.1  1.7 - 7.7 K/uL   Lymphocytes Relative 34  12 - 46 %   Lymphs Abs 2.9  0.7 - 4.0 K/uL   Monocytes Relative 7  3 - 12 %   Monocytes Absolute 0.6  0.1 - 1.0 K/uL   Eosinophils Relative 1  0 - 5 %   Eosinophils Absolute 0.1  0.0 - 0.7 K/uL   Basophils Relative 0  0 - 1 %   Basophils Absolute 0.0  0.0 - 0.1 K/uL  BASIC METABOLIC PANEL      Result Value Ref Range   Sodium 139  137 - 147 mEq/L   Potassium 4.1  3.7 - 5.3 mEq/L   Chloride 101  96 - 112 mEq/L   CO2 26  19 - 32 mEq/L   Glucose, Bld 93  70 - 99 mg/dL   BUN 8  6 - 23 mg/dL   Creatinine, Ser 0.86  0.50 - 1.10 mg/dL   Calcium 9.9  8.4 - 10.5 mg/dL   GFR calc non Af Amer >90  >90 mL/min   GFR calc Af Amer >90  >90 mL/min  POC URINE PREG, ED      Result Value Ref Range   Preg Test, Ur NEGATIVE  NEGATIVE    Imaging Review No results found.   EKG Interpretation None        Date: 01/20/2014  Rate: 56  Rhythm: sinus bradycardia  QRS Axis: normal  Intervals: normal  ST/T Wave abnormalities: normal  Conduction Disutrbances:none  Narrative Interpretation:   Old EKG Reviewed: none available   MDM   Final diagnoses:  Syncope  Dehydration    Patients labs and/or radiological studies were viewed and considered during the medical decision making and disposition process. Pt was encouraged to increase fluid intake,  Avoid skipping meals. F/u with pcp if sx persist.  She was not orthostatic here and appeared stable for dc home.     Evalee Jefferson, PA-C 01/20/14 [place], PA-C 01/20/14 [phone]

## 2014-01-20 NOTE — ED Provider Notes (Signed)
Medical screening examination/treatment/procedure(s) were performed by non-physician practitioner and as supervising physician I was immediately available for consultation/collaboration.   EKG Interpretation None        Sharyon Cable, MD 01/20/14 [phone]

## 2014-01-20 NOTE — Discharge Instructions (Signed)
Dehydration, Adult Dehydration means your body does not have as much fluid as it needs. Your kidneys, brain, and heart will not work properly without the right amount of fluids and salt.  HOME CARE  Ask your doctor how to replace body fluid losses (rehydrate).  Drink enough fluids to keep your pee (urine) clear or pale yellow.  Drink small amounts of fluids often if you feel sick to your stomach (nauseous) or throw up (vomit).  Eat like you normally do.  Avoid:  Foods or drinks high in sugar.  Bubbly (carbonated) drinks.  Juice.  Very hot or cold fluids.  Drinks with caffeine.  Fatty, greasy foods.  Alcohol.  Tobacco.  Eating too much.  Gelatin desserts.  Wash your hands to avoid spreading germs (bacteria, viruses).  Only take medicine as told by your doctor.  Keep all doctor visits as told. GET HELP RIGHT AWAY IF:   You cannot drink something without throwing up.  You get worse even with treatment.  Your vomit has blood in it or looks greenish.  Your poop (stool) has blood in it or looks black and tarry.  You have not peed in 6 to 8 hours.  You pee a small amount of very dark pee.  You have a fever.  You pass out (faint).  You have belly (abdominal) pain that gets worse or stays in one spot (localizes).  You have a rash, stiff neck, or bad headache.  You get easily annoyed, sleepy, or are hard to wake up.  You feel weak, dizzy, or very thirsty. MAKE SURE YOU:   Understand these instructions.  Will watch your condition.  Will get help right away if you are not doing well or get worse. Document Released: 07/06/2009 Document Revised: 12/02/2011 Document Reviewed: 04/29/2011 [facility] Patient Information 2014 [place], [place].  Syncope Syncope means a person passes out (faints). The person usually wakes up in less than 5 minutes. It is important to seek medical care for syncope. HOME CARE  Have someone stay with you until you feel normal.  Do  not drive, use machines, or play sports until your doctor says it is okay.  Keep all doctor visits as told.  Lie down when you feel like you might pass out. Take deep breaths. Wait until you feel normal before standing up.  Drink enough fluids to keep your pee (urine) clear or pale yellow.  If you take blood pressure or heart medicine, get up slowly. Take several minutes to sit and then stand. GET HELP RIGHT AWAY IF:   You have a severe headache.  You have pain in the chest, belly (abdomen), or back.  You are bleeding from the mouth or butt (rectum).  You have black or tarry poop (stool).  You have an irregular or very fast heartbeat.  You have pain with breathing.  You keep passing out, or you have shaking (seizures) when you pass out.  You pass out when sitting or lying down.  You feel confused.  You have trouble walking.  You have severe weakness.  You have vision problems. If you fainted, call your local emergency services (911 in [place] Do not drive yourself to the hospital. MAKE SURE YOU:   Understand these instructions.  Will watch your condition.  Will get help right away if you are not doing well or get worse. Document Released: 02/26/2008 Document Revised: 03/10/2012 Document Reviewed: 11/08/2011 [facility] Patient Information 2014 [place], [place].   Make sure you are increasing your fluid intake  and avoid skipping meals.  This may help you feel better.  Followup with with primary doctor if your symptoms persist.  Your lab tests tonight are ok,  But your urine is concentrated, telling me you are not hydrated enough.

## 2014-02-01 ENCOUNTER — Other Ambulatory Visit (HOSPITAL_COMMUNITY)
Admission: RE | Admit: 2014-02-01 | Discharge: 2014-02-01 | Disposition: A | Discharge status: Home / Self Care | Payer: Medicaid Other | Source: Ambulatory Visit | Attending: Unknown Physician Specialty | Admitting: Unknown Physician Specialty

## 2014-02-01 DIAGNOSIS — R87612 Low grade squamous intraepithelial lesion on cytologic smear of cervix (LGSIL): Secondary | ICD-10-CM | POA: Insufficient documentation

## 2014-02-01 DIAGNOSIS — N871 Moderate cervical dysplasia: Secondary | ICD-10-CM | POA: Insufficient documentation

## 2014-02-01 DIAGNOSIS — N870 Mild cervical dysplasia: Secondary | ICD-10-CM | POA: Insufficient documentation

## 2014-04-15 ENCOUNTER — Emergency Department (HOSPITAL_COMMUNITY)
Admission: EM | Admit: 2014-04-15 | Discharge: 2014-04-15 | Disposition: A | Discharge status: Home / Self Care | Payer: Medicaid Other | Attending: Emergency Medicine | Admitting: Emergency Medicine

## 2014-04-15 ENCOUNTER — Encounter (HOSPITAL_COMMUNITY): Payer: Self-pay | Admitting: Emergency Medicine

## 2014-04-15 DIAGNOSIS — Z3202 Encounter for pregnancy test, result negative: Secondary | ICD-10-CM | POA: Insufficient documentation

## 2014-04-15 DIAGNOSIS — R55 Syncope and collapse: Secondary | ICD-10-CM | POA: Insufficient documentation

## 2014-04-15 DIAGNOSIS — F17200 Nicotine dependence, unspecified, uncomplicated: Secondary | ICD-10-CM | POA: Insufficient documentation

## 2014-04-15 DIAGNOSIS — B839 Helminthiasis, unspecified: Secondary | ICD-10-CM

## 2014-04-15 DIAGNOSIS — B80 Enterobiasis: Secondary | ICD-10-CM | POA: Insufficient documentation

## 2014-04-15 DIAGNOSIS — R110 Nausea: Secondary | ICD-10-CM

## 2014-04-15 LAB — URINALYSIS, ROUTINE W REFLEX MICROSCOPIC
Glucose, UA: NEGATIVE mg/dL
Hgb urine dipstick: NEGATIVE
LEUKOCYTES UA: NEGATIVE
Nitrite: NEGATIVE
Protein, ur: NEGATIVE mg/dL
Specific Gravity, Urine: >1.03 — ABNORMAL HIGH (ref 1.005–1.030)
UROBILINOGEN UA: 1 mg/dL (ref 0.0–1.0)
pH: 5.5 (ref 5.0–8.0)

## 2014-04-15 LAB — PREGNANCY, URINE: PREG TEST UR: NEGATIVE

## 2014-04-15 MED ORDER — ONDANSETRON 4 MG PO TBDP: ORAL_TABLET | ORAL | Status: DC

## 2014-04-15 MED ORDER — ALBENDAZOLE 200 MG PO TABS: 400.0000 mg | ORAL_TABLET | Freq: Once | ORAL | Status: DC

## 2014-04-15 NOTE — ED Provider Notes (Signed)
CSN: [id number]     Arrival date & time 04/15/14  0147 History   First MD Initiated Contact with Patient 04/15/14 [phone]     Chief Complaint  Patient presents with  . Pinworms      (Consider location/radiation/quality/duration/timing/severity/associated sxs/prior Treatment) HPI Comments: 23 year old female with no significant medical history presents after syncope and worms in her stool today. Patient denies recent travel, new foods or history of similar. No contacts with similar that she knows of. Patient has had mild nausea recently but no other symptoms. No blood in her stools.  The history is provided by the patient.    History reviewed. No pertinent past medical history. Past Surgical History  Procedure Laterality Date  . Back surgery     No family history on file. History  Substance Use Topics  . Smoking status: Current Every Day Smoker -- 1.00 packs/day  . Smokeless tobacco: Not on file     Comment: never done snuff or chewing tobacco.  . Alcohol Use: No     Comment: occasionally   OB History   Grav Para Term Preterm Abortions TAB SAB Ect Mult Living   1              Review of Systems  Constitutional: Positive for appetite change. Negative for fever and chills.  Respiratory: Negative for cough.   Gastrointestinal: Positive for nausea. Negative for vomiting, abdominal pain and blood in stool.  Skin: Negative for rash.  Neurological: Negative for headaches.      Allergies  Review of patient's allergies indicates no known allergies.  Home Medications   Prior to Admission medications   Medication Sig Start Date End Date Taking? Authorizing Provider  albendazole (ALBENZA) 200 MG tablet Take 2 tablets (400 mg total) by mouth once. Repeat in 2 wks if no improvement 04/15/14   Mariea Clonts, MD  HYDROcodone-acetaminophen (NORCO/VICODIN) 5-325 MG per tablet Take 1 tablet by mouth every 4 (four) hours as needed. 01/04/14   Carole Civil, MD  ibuprofen  (ADVIL,MOTRIN) 600 MG tablet Take 1 tablet (600 mg total) by mouth every 6 (six) hours as needed for pain. 06/15/13   Evalee Jefferson, PA-C  ondansetron (ZOFRAN ODT) 4 MG disintegrating tablet 4mg  ODT q4 hours prn nausea/vomit 04/15/14   Mariea Clonts, MD  ondansetron (ZOFRAN) 4 MG tablet Take 1 tablet (4 mg total) by mouth every 6 (six) hours. 10/25/13   Ezequiel Essex, MD  promethazine (PHENERGAN) 25 MG tablet Take 1 tablet (25 mg total) by mouth every 6 (six) hours as needed for nausea or vomiting. 01/20/14   Evalee Jefferson, PA-C  ranitidine (ZANTAC) 150 MG tablet Take 300 mg by mouth 2 (two) times daily as needed for heartburn.    Historical Provider, MD   BP 128/74  Pulse 88  Temp(Src) 97.7 F (36.5 C) (Oral)  Resp 18  Ht 5\' 10"  (1.778 m)  Wt 180 lb (81.647 kg)  BMI 25.83 kg/m2  SpO2 100%  LMP 03/27/2014 Physical Exam  Nursing note and vitals reviewed. Constitutional: She appears well-developed and well-nourished.  HENT:  Head: Normocephalic and atraumatic.  Eyes: Conjunctivae are normal.  Neck: Neck supple.  Cardiovascular: Normal rate.   Pulmonary/Chest: Effort normal.  Abdominal: Soft. She exhibits no distension. There is no tenderness. There is no guarding.  Neurological: She is alert.  Skin: Skin is warm. No rash noted.  Psychiatric: She has a normal mood and affect.    ED Course  Procedures (including critical care time)  Labs Review Labs Reviewed  URINALYSIS, ROUTINE W REFLEX MICROSCOPIC - Abnormal; Notable for the following:    Specific Gravity, Urine >1.030 (*)    Bilirubin Urine SMALL (*)    Ketones, ur TRACE (*)    All other components within normal limits  PREGNANCY, URINE    Imaging Review No results found.   EKG Interpretation None      MDM   Final diagnoses:  Worms in stool  Nausea   Well-appearing patient who visualized a small warm in her stool today. Discussed rectal exam with the patient and she felt if it would not change management that she would  rather hold off at this time and try to treatment. Discussed reasons to return in followup with primary Dr. Kathyrn Lass for albendazole by mouth.  Results and differential diagnosis were discussed with the patient/parent/guardian. Close follow up outpatient was discussed, comfortable with the plan.   Medications - No data to display  Filed Vitals:   04/15/14 0154  BP: 128/74  Pulse: 88  Temp: 97.7 F (36.5 C)  TempSrc: Oral  Resp: 18  Height: 5\' 10"  (1.778 m)  Weight: 180 lb (81.647 kg)  SpO2: 100%         Mariea Clonts, MD 04/15/14 0230

## 2014-04-15 NOTE — Discharge Instructions (Signed)
If you were given medicines take as directed.  If you are on coumadin or contraceptives realize their levels and effectiveness is altered by many different medicines.  If you have any reaction (rash, tongues swelling, other) to the medicines stop taking and see a physician.   Please follow up as directed and return to the ER or see a physician for new or worsening symptoms.  Thank you. Filed Vitals:   04/15/14 0154  BP: 128/74  Pulse: 88  Temp: 97.7 F (36.5 C)  TempSrc: Oral  Resp: 18  Height: 5\' 10"  (1.778 m)  Weight: 180 lb (81.647 kg)  SpO2: 100%

## 2014-04-15 NOTE — ED Notes (Signed)
MD at bedside. 

## 2014-04-15 NOTE — ED Notes (Signed)
Pt reports seeing pinworms today.

## 2014-05-04 ENCOUNTER — Other Ambulatory Visit (HOSPITAL_COMMUNITY): Payer: Self-pay | Admitting: Nurse Practitioner

## 2014-05-04 DIAGNOSIS — N949 Unspecified condition associated with female genital organs and menstrual cycle: Secondary | ICD-10-CM

## 2014-05-09 ENCOUNTER — Ambulatory Visit (HOSPITAL_COMMUNITY)
Admission: RE | Admit: 2014-05-09 | Discharge: 2014-05-09 | Disposition: A | Discharge status: Home / Self Care | Payer: Self-pay | Source: Ambulatory Visit | Attending: Nurse Practitioner | Admitting: Nurse Practitioner

## 2014-05-09 DIAGNOSIS — N949 Unspecified condition associated with female genital organs and menstrual cycle: Secondary | ICD-10-CM | POA: Insufficient documentation

## 2014-06-02 ENCOUNTER — Encounter (HOSPITAL_COMMUNITY): Payer: Self-pay | Admitting: Emergency Medicine

## 2014-06-02 ENCOUNTER — Emergency Department (HOSPITAL_COMMUNITY): Payer: Medicaid Other

## 2014-06-02 ENCOUNTER — Emergency Department (HOSPITAL_COMMUNITY)
Admission: EM | Admit: 2014-06-02 | Discharge: 2014-06-02 | Disposition: A | Discharge status: Home / Self Care | Payer: Medicaid Other | Attending: Emergency Medicine | Admitting: Emergency Medicine

## 2014-06-02 DIAGNOSIS — J069 Acute upper respiratory infection, unspecified: Secondary | ICD-10-CM

## 2014-06-02 DIAGNOSIS — R059 Cough, unspecified: Secondary | ICD-10-CM

## 2014-06-02 DIAGNOSIS — R05 Cough: Secondary | ICD-10-CM | POA: Insufficient documentation

## 2014-06-02 DIAGNOSIS — F17200 Nicotine dependence, unspecified, uncomplicated: Secondary | ICD-10-CM | POA: Insufficient documentation

## 2014-06-02 MED ORDER — ALBUTEROL SULFATE HFA 108 (90 BASE) MCG/ACT IN AERS
2.0000 | INHALATION_SPRAY | RESPIRATORY_TRACT | Status: DC | PRN
  Administered 2014-06-02: 2 via RESPIRATORY_TRACT
  Filled 2014-06-02: qty 6.7

## 2014-06-02 MED ORDER — AZITHROMYCIN 250 MG PO TABS: 250.0000 mg | ORAL_TABLET | Freq: Every day | ORAL | Status: DC

## 2014-06-02 MED ORDER — AZITHROMYCIN 250 MG PO TABS
500.0000 mg | ORAL_TABLET | Freq: Once | ORAL | Status: AC
  Administered 2014-06-02: 500 mg via ORAL
  Filled 2014-06-02: qty 2

## 2014-06-02 NOTE — ED Notes (Signed)
Respiratory called and coming to do inhaler instructions.

## 2014-06-02 NOTE — ED Provider Notes (Signed)
CSN: [id number]     Arrival date & time 06/02/14  1518 History   First MD Initiated Contact with Patient 06/02/14 1622     Chief Complaint  Patient presents with  . Cough     (Consider location/radiation/quality/duration/timing/severity/associated sxs/prior Treatment) HPI  ELISABELLA HACKER is a 23 y.o. female who presents to the Emergency Department complaining of productive cough, subjective fever, chills, nasal congestion for one week.  Also c/o tenderness to her right upper chest with deep breathing and certain movements.  She states the cough is productive of white to yellow sputum at times.  She has been taking OTC cold medications without relief.  She also reports a frontal headache intermittently and occasional chills.  She denies shortness of breath, abd pain, vomiting or neck stiffness.     History reviewed. No pertinent past medical history. Past Surgical History  Procedure Laterality Date  . Back surgery    . Hernia repair     History reviewed. No pertinent family history. History  Substance Use Topics  . Smoking status: Current Every Day Smoker -- 1.00 packs/day  . Smokeless tobacco: Not on file     Comment: never done snuff or chewing tobacco.  . Alcohol Use: No     Comment: occasionally   OB History   Grav Para Term Preterm Abortions TAB SAB Ect Mult Living   1              Review of Systems  Constitutional: Positive for fever and chills. Negative for activity change and appetite change.  HENT: Positive for congestion and rhinorrhea. Negative for facial swelling, sore throat and trouble swallowing.   Eyes: Negative for visual disturbance.  Respiratory: Positive for cough. Negative for shortness of breath, wheezing and stridor.   Cardiovascular: Positive for chest pain.  Gastrointestinal: Negative for nausea and vomiting.  Musculoskeletal: Negative for neck pain and neck stiffness.  Skin: Negative.   Neurological: Positive for headaches. Negative for dizziness,  syncope, weakness and numbness.  Hematological: Negative for adenopathy.  Psychiatric/Behavioral: Negative for confusion.  All other systems reviewed and are negative.     Allergies  Review of patient's allergies indicates no known allergies.  Home Medications   Prior to Admission medications   Medication Sig Start Date End Date Taking? Authorizing Provider  albendazole (ALBENZA) 200 MG tablet Take 2 tablets (400 mg total) by mouth once. Repeat in 2 wks if no improvement 04/15/14   Mariea Clonts, MD  HYDROcodone-acetaminophen (NORCO/VICODIN) 5-325 MG per tablet Take 1 tablet by mouth every 4 (four) hours as needed. 01/04/14   Carole Civil, MD  ibuprofen (ADVIL,MOTRIN) 600 MG tablet Take 1 tablet (600 mg total) by mouth every 6 (six) hours as needed for pain. 06/15/13   Evalee Jefferson, PA-C  ondansetron (ZOFRAN ODT) 4 MG disintegrating tablet 4mg  ODT q4 hours prn nausea/vomit 04/15/14   Mariea Clonts, MD  ondansetron (ZOFRAN) 4 MG tablet Take 1 tablet (4 mg total) by mouth every 6 (six) hours. 10/25/13   Ezequiel Essex, MD  promethazine (PHENERGAN) 25 MG tablet Take 1 tablet (25 mg total) by mouth every 6 (six) hours as needed for nausea or vomiting. 01/20/14   Evalee Jefferson, PA-C  ranitidine (ZANTAC) 150 MG tablet Take 300 mg by mouth 2 (two) times daily as needed for heartburn.    Historical Provider, MD   BP 119/64  Pulse 67  Temp(Src) 98.3 F (36.8 C) (Oral)  Resp 18  Wt 180 lb (81.647 kg)  SpO2 98%  LMP 05/26/2014 Physical Exam  Nursing note and vitals reviewed. Constitutional: She is oriented to person, place, and time. She appears well-developed and well-nourished. No distress.  HENT:  Head: Normocephalic and atraumatic.  Right Ear: Tympanic membrane and ear canal normal.  Left Ear: Tympanic membrane and ear canal normal.  Nose: Mucosal edema present.  Mouth/Throat: Uvula is midline, oropharynx is clear and moist and mucous membranes are normal. No oropharyngeal exudate.   Eyes: EOM are normal. Pupils are equal, round, and reactive to light.  Neck: Normal range of motion, full passive range of motion without pain and phonation normal. Neck supple.  Cardiovascular: Normal rate, regular rhythm, normal heart sounds and intact distal pulses.   No murmur heard. Pulmonary/Chest: Effort normal. No stridor. No respiratory distress. She has no wheezes. She has no rales. She exhibits no tenderness.  Coarse lungs sounds bilaterally.  No rales or wheezing  Musculoskeletal: Normal range of motion. She exhibits no edema.  Lymphadenopathy:    She has no cervical adenopathy.  Neurological: She is alert and oriented to person, place, and time. She exhibits normal muscle tone. Coordination normal.  Skin: Skin is warm and dry.    ED Course  Procedures (including critical care time) Labs Review Labs Reviewed - No data to display  Imaging Review Dg Chest 2 View  06/02/2014   CLINICAL DATA:  Productive cough.  EXAM: CHEST  2 VIEW  COMPARISON:  Two-view chest 10/25/2013  FINDINGS: The heart size is normal. The lungs are clear. The visualized soft tissues and bony thorax are unremarkable.  IMPRESSION: Negative two view chest.   Electronically Signed   By: Lawrence Santiago M.D.   On: 06/02/2014 17:01     EKG Interpretation None      MDM   Final diagnoses:  Cough  URI (upper respiratory infection)     patient is well appearing.  VSS.  Productive cough and URI sx's as well.  Well's PE score shows low probability for PE and clinical suspicion is low.  Pt agrees to z pack, OTC expectorant and albuterol inhaler dispensed.  She was advised to return for any worsening symptoms and she agrees to plan.      Gwendolyn Nishi L. [place], PA-C 06/03/14 1244

## 2014-06-02 NOTE — Discharge Instructions (Signed)
Cough, Adult   A cough is a reflex. It helps you clear your throat and airways. A cough can help heal your body. A cough can last 2 or 3 weeks (acute) or may last more than 8 weeks (chronic). Some common causes of a cough can include an infection, allergy, or a cold.  HOME CARE  · Only take medicine as told by your doctor.  · If given, take your medicines (antibiotics) as told. Finish them even if you start to feel better.  · Use a cold steam vaporizer or humidifier in your home. This can help loosen thick spit (secretions).  · Sleep so you are almost sitting up (semi-upright). Use pillows to do this. This helps reduce coughing.  · Rest as needed.  · Stop smoking if you smoke.  GET HELP RIGHT AWAY IF:  · You have yellowish-white fluid (pus) in your thick spit.  · Your cough gets worse.  · Your medicine does not reduce coughing, and you are losing sleep.  · You cough up blood.  · You have trouble breathing.  · Your pain gets worse and medicine does not help.  · You have a fever.  MAKE SURE YOU:   · Understand these instructions.  · Will watch your condition.  · Will get help right away if you are not doing well or get worse.  Document Released: 05/23/2011 Document Revised: 01/24/2014 Document Reviewed: 05/23/2011  [employer]® Patient Information ©2015 [employer]. This information is not intended to replace advice given to you by your health care provider. Make sure you discuss any questions you have with your health care provider.

## 2014-06-02 NOTE — ED Notes (Signed)
Respiratory therapy at bedside.

## 2014-06-02 NOTE — ED Notes (Signed)
PT c/o intermittent fever with productive cough of white/yellow sputum x1 week.

## 2014-06-02 NOTE — ED Notes (Signed)
Cough, white- yellow sputum.  Fever.  Headache, chest hurts.with deep breath or moving arms.

## 2014-06-03 NOTE — ED Provider Notes (Signed)
Medical screening examination/treatment/procedure(s) were performed by non-physician practitioner and as supervising physician I was immediately available for consultation/collaboration.   EKG Interpretation None        Francine Graven, DO 06/03/14 1522

## 2014-07-25 ENCOUNTER — Encounter (HOSPITAL_COMMUNITY): Payer: Self-pay | Admitting: Emergency Medicine

## 2014-10-04 ENCOUNTER — Other Ambulatory Visit (HOSPITAL_COMMUNITY)
Admission: RE | Admit: 2014-10-04 | Discharge: 2014-10-04 | Disposition: A | Discharge status: Home / Self Care | Payer: Medicaid Other | Source: Ambulatory Visit | Attending: Unknown Physician Specialty | Admitting: Unknown Physician Specialty

## 2014-10-04 DIAGNOSIS — R87612 Low grade squamous intraepithelial lesion on cytologic smear of cervix (LGSIL): Secondary | ICD-10-CM | POA: Insufficient documentation

## 2014-10-04 DIAGNOSIS — N870 Mild cervical dysplasia: Secondary | ICD-10-CM | POA: Insufficient documentation

## 2014-10-07 LAB — CYTOLOGY - PAP

## 2014-11-09 ENCOUNTER — Emergency Department (HOSPITAL_COMMUNITY): Payer: Medicaid Other

## 2014-11-09 ENCOUNTER — Emergency Department (HOSPITAL_COMMUNITY)
Admission: EM | Admit: 2014-11-09 | Discharge: 2014-11-09 | Disposition: A | Discharge status: Home / Self Care | Payer: Medicaid Other | Attending: Emergency Medicine | Admitting: Emergency Medicine

## 2014-11-09 ENCOUNTER — Encounter (HOSPITAL_COMMUNITY): Payer: Self-pay | Admitting: *Deleted

## 2014-11-09 DIAGNOSIS — Z720 Tobacco use: Secondary | ICD-10-CM | POA: Insufficient documentation

## 2014-11-09 DIAGNOSIS — Z792 Long term (current) use of antibiotics: Secondary | ICD-10-CM | POA: Insufficient documentation

## 2014-11-09 DIAGNOSIS — Z79899 Other long term (current) drug therapy: Secondary | ICD-10-CM | POA: Insufficient documentation

## 2014-11-09 DIAGNOSIS — N751 Abscess of Bartholin's gland: Secondary | ICD-10-CM

## 2014-11-09 DIAGNOSIS — Z791 Long term (current) use of non-steroidal anti-inflammatories (NSAID): Secondary | ICD-10-CM | POA: Insufficient documentation

## 2014-11-09 DIAGNOSIS — L989 Disorder of the skin and subcutaneous tissue, unspecified: Secondary | ICD-10-CM | POA: Insufficient documentation

## 2014-11-09 MED ORDER — ACYCLOVIR 400 MG PO TABS: 400.0000 mg | ORAL_TABLET | Freq: Three times a day (TID) | ORAL | Status: DC

## 2014-11-09 MED ORDER — LIDOCAINE HCL (PF) 2 % IJ SOLN
10.0000 mL | Freq: Once | INTRAMUSCULAR | Status: DC
  Filled 2014-11-09: qty 10

## 2014-11-09 MED ORDER — HYDROCODONE-ACETAMINOPHEN 5-325 MG PO TABS: 1.0000 | ORAL_TABLET | ORAL | Status: DC | PRN

## 2014-11-09 NOTE — Discharge Instructions (Signed)
Bartholin's Cyst or Abscess Bartholin's glands are small glands located within the folds of skin (labia) along the sides of the lower opening of the vagina (birth canal). A cyst may develop when the duct of the gland becomes blocked. When this happens, fluid that accumulates within the cyst can become infected. This is known as an abscess. The Bartholin gland produces a mucous fluid to lubricate the outside of the vagina during sexual intercourse. SYMPTOMS   Patients with a small cyst may not have any symptoms.  Mild discomfort to severe pain depending on the size of the cyst and if it is infected (abscess).  Pain, redness, and swelling around the lower opening of the vagina.  Painful intercourse.  Pressure in the perineal area.  Swelling of the lips of the vagina (labia).  The cyst or abscess can be on one side or both sides of the vagina. DIAGNOSIS   A large swelling is seen in the lower vagina area by your caregiver.  Painful to touch.  Redness and pain, if it is an abscess. TREATMENT   Sometimes the cyst will go away on its own.  Apply warm wet compresses to the area or take hot sitz baths several times a day.  An incision to drain the cyst or abscess with local anesthesia.  Culture the pus, if it is an abscess.  Antibiotic treatment, if it is an abscess.  Cut open the gland and suture the edges to make the opening of the gland bigger (marsupialization).  Remove the whole gland if the cyst or abscess returns. PREVENTION   Practice good hygiene.  Clean the vaginal area with a mild soap and soft cloth when bathing.  Do not rub hard in the vaginal area when bathing.  Protect the crotch area with a padded cushion if you take long bike rides or ride horses.  Be sure you are well lubricated when you have sexual intercourse. HOME CARE INSTRUCTIONS   If your cyst or abscess was opened, a small piece of gauze, or a drain, may have been placed in the wound to allow  drainage. Do not remove this gauze or drain unless directed by your caregiver.  Wear feminine pads, not tampons, as needed for any drainage or bleeding.  If antibiotics were prescribed, take them exactly as directed. Finish the entire course.  Only take over-the-counter or prescription medicines for pain, discomfort, or fever as directed by your caregiver. SEEK IMMEDIATE MEDICAL CARE IF:   You have an increase in pain, redness, swelling, or drainage.  You have bleeding from the wound which results in the use of more than the number of pads suggested by your caregiver in 24 hours.  You have chills.  You have a fever.  You develop any new problems (symptoms) or aggravation of your existing condition. MAKE SURE YOU:   Understand these instructions.  Will watch your condition.  Will get help right away if you are not doing well or get worse. Document Released: 09/09/2005 Document Revised: 12/02/2011 Document Reviewed: 04/27/2008 [facility] Patient Information 2015 [place], [place]. This information is not intended to replace advice given to you by your health care provider. Make sure you discuss any questions you have with your health care provider.   Start taking the acyclovir as prescribed as we are waiting for the herpes culture to result.  Start warm compresses or tub soaks to help resolve the swelling and continue drainage - there does not appear to be a pus pocket (abscess) at this  time.    I have printed information about herpes for you - remember- the culture will need to be completed before this diagnosis can be made.  You may take the hydrocodone prescribed for pain relief.  This will make you drowsy - do not drive within 4 hours of taking this medication.

## 2014-11-09 NOTE — ED Notes (Signed)
Pt states Bartholin cyst to left labia. Pt also states pain to left leg. States cyst to right labia also, but not as bad.

## 2014-11-09 NOTE — ED Notes (Signed)
nad noted prior to dc. Dc instructions reviewed and explained. Voiced understanding. 2 Rx given to pt.

## 2014-11-11 LAB — HERPES SIMPLEX VIRUS CULTURE: CULTURE: DETECTED

## 2014-11-11 NOTE — ED Provider Notes (Signed)
CSN: [id number]     Arrival date & time 11/09/14  1222 History   First MD Initiated Contact with Patient 11/09/14 1520     Chief Complaint  Patient presents with  . Abscess     (Consider location/radiation/quality/duration/timing/severity/associated sxs/prior Treatment) The history is provided by the patient.   Debbie Santana is a 24 y.o. female with a one week history of swelling and pain of her left and right labia, but left greater than right, and reminiscent of a prior Bartholins cyst abscess.  She reports clear drainage from the site starting today.  She has employed warm tub soaks without improvement in pain which is worsened with palpation, but describes constant burning pain at the site.  She has found no alleviators.  She does have unprotected sex with a stable partner.  She denies dysuria and vaginal discharge.  Additionally she denies fevers or chills, nausea, vomiting or other complaint.  LMP 10/28/14 and normal.    History reviewed. No pertinent past medical history. Past Surgical History  Procedure Laterality Date  . Back surgery    . Hernia repair     No family history on file. History  Substance Use Topics  . Smoking status: Current Every Day Smoker -- 1.00 packs/day    Types: Cigarettes  . Smokeless tobacco: Not on file     Comment: never done snuff or chewing tobacco.  . Alcohol Use: No     Comment: occasionally   OB History    Gravida Para Term Preterm AB TAB SAB Ectopic Multiple Living   1              Review of Systems  Constitutional: Negative for fever and chills.  HENT: Negative for congestion and sore throat.   Eyes: Negative.   Respiratory: Negative for chest tightness and shortness of breath.   Cardiovascular: Negative for chest pain.  Gastrointestinal: Negative for nausea and abdominal pain.  Genitourinary: Positive for vaginal pain. Negative for vaginal discharge.  Musculoskeletal: Negative for joint swelling, arthralgias and neck pain.  Skin:  Negative.  Negative for rash and wound.  Neurological: Negative for dizziness, weakness, light-headedness, numbness and headaches.  Psychiatric/Behavioral: Negative.       Allergies  Review of patient's allergies indicates no known allergies.  Home Medications   Prior to Admission medications   Medication Sig Start Date End Date Taking? Authorizing Provider  acyclovir (ZOVIRAX) 400 MG tablet Take 1 tablet (400 mg total) by mouth 3 (three) times daily. 11/09/14   Evalee Jefferson, PA-C  albendazole (ALBENZA) 200 MG tablet Take 2 tablets (400 mg total) by mouth once. Repeat in 2 wks if no improvement 04/15/14   Mariea Clonts, MD  azithromycin (ZITHROMAX) 250 MG tablet Take 1 tablet (250 mg total) by mouth daily. Take first 2 tablets together, then 1 every day until finished. 06/02/14   Tammy L. Triplett, PA-C  HYDROcodone-acetaminophen (NORCO/VICODIN) 5-325 MG per tablet Take 1 tablet by mouth every 4 (four) hours as needed. 11/09/14   Evalee Jefferson, PA-C  ibuprofen (ADVIL,MOTRIN) 600 MG tablet Take 1 tablet (600 mg total) by mouth every 6 (six) hours as needed for pain. 06/15/13   Evalee Jefferson, PA-C  ondansetron (ZOFRAN ODT) 4 MG disintegrating tablet 4mg  ODT q4 hours prn nausea/vomit 04/15/14   Mariea Clonts, MD  ondansetron (ZOFRAN) 4 MG tablet Take 1 tablet (4 mg total) by mouth every 6 (six) hours. 10/25/13   Ezequiel Essex, MD  promethazine (PHENERGAN) 25 MG tablet Take  1 tablet (25 mg total) by mouth every 6 (six) hours as needed for nausea or vomiting. 01/20/14   Evalee Jefferson, PA-C  ranitidine (ZANTAC) 150 MG tablet Take 300 mg by mouth 2 (two) times daily as needed for heartburn.    Historical Provider, MD   BP 128/79 mmHg  Pulse 69  Temp(Src) 99 F (37.2 C) (Oral)  Resp 16  Ht 5\' 9"  (1.753 m)  Wt 189 lb (85.73 kg)  BMI 27.90 kg/m2  SpO2 99%  LMP 10/28/2014 Physical Exam  Constitutional: She appears well-developed and well-nourished.  HENT:  Head: Normocephalic and atraumatic.  Eyes:  Conjunctivae are normal.  Neck: Normal range of motion.  Cardiovascular: Normal rate, regular rhythm, normal heart sounds and intact distal pulses.   Pulmonary/Chest: Effort normal and breath sounds normal. She has no wheezes.  Abdominal: Soft. Bowel sounds are normal. There is no tenderness.  Genitourinary: Vagina normal. There is tenderness and lesion on the left labia.  Fluctuant, mildly edematous left posterior labia majora without induration , but with mild erythema.  There is a lesion at this site which appears to be a small cluster of centrally located burst vesicles surrounded by erythema.  TTP.    Musculoskeletal: Normal range of motion.  Neurological: She is alert.  Skin: Skin is warm and dry.  Psychiatric: She has a normal mood and affect.  Nursing note and vitals reviewed.   ED Course  INCISION AND DRAINAGE Date/Time: 11/02/2014 5:20 PM Performed by: Evalee Jefferson Authorized by: Evalee Jefferson Consent: Verbal consent obtained. Risks and benefits: risks, benefits and alternatives were discussed Consent given by: patient Patient identity confirmed: verbally with patient Type: abscess Body area: anogenital Location details: Bartholin's gland Anesthesia: local infiltration Local anesthetic: lidocaine 1% without epinephrine Anesthetic total: 2 ml Patient sedated: no Scalpel size: 11 Incision type: single straight Complexity: simple Drainage: bloody Drainage amount: scant Wound treatment: wound left open Patient tolerance: Patient tolerated the procedure well with no immediate complications   (including critical care time) Labs Review Labs Reviewed  HERPES SIMPLEX VIRUS CULTURE    Imaging Review Dg Chest 2 View  11/09/2014   CLINICAL DATA:  Wheezing and shortness of breath for 3-4 days  EXAM: CHEST  2 VIEW  COMPARISON:  06/02/2014  FINDINGS: The heart size and mediastinal contours are within normal limits. Both lungs are clear. The visualized skeletal structures are  unremarkable.  IMPRESSION: No active cardiopulmonary disease.   Electronically Signed   By: Inez Catalina M.D.   On: 11/09/2014 16:06     EKG Interpretation None      MDM   Final diagnoses:  Bartholin's gland abscess  Skin lesion    Herpes culture also obtained, pt aware this test is pending.  She was given acyclovir pending viral culture, information regarding this probable infection given, advised abstinence until lesion is healed. Advised f/u for worsened sx.  No purulence from the bartholin I & D, pt was advised warm soaks, regardless.  Referrals given for establishing f/u care.    Evalee Jefferson, PA-C 11/11/14 [place], DO 11/12/14 [id number]

## 2014-11-13 ENCOUNTER — Telehealth (HOSPITAL_BASED_OUTPATIENT_CLINIC_OR_DEPARTMENT_OTHER): Payer: Self-pay | Admitting: Emergency Medicine

## 2014-11-15 ENCOUNTER — Telehealth (HOSPITAL_BASED_OUTPATIENT_CLINIC_OR_DEPARTMENT_OTHER): Payer: Self-pay | Admitting: Emergency Medicine

## 2015-02-02 ENCOUNTER — Encounter (HOSPITAL_COMMUNITY): Payer: Self-pay

## 2015-02-02 ENCOUNTER — Emergency Department (HOSPITAL_COMMUNITY)
Admission: EM | Admit: 2015-02-02 | Discharge: 2015-02-02 | Disposition: A | Discharge status: Home / Self Care | Payer: Medicaid Other | Attending: Emergency Medicine | Admitting: Emergency Medicine

## 2015-02-02 DIAGNOSIS — N730 Acute parametritis and pelvic cellulitis: Secondary | ICD-10-CM | POA: Insufficient documentation

## 2015-02-02 DIAGNOSIS — Z3202 Encounter for pregnancy test, result negative: Secondary | ICD-10-CM | POA: Insufficient documentation

## 2015-02-02 DIAGNOSIS — Z79899 Other long term (current) drug therapy: Secondary | ICD-10-CM | POA: Insufficient documentation

## 2015-02-02 DIAGNOSIS — R000 Tachycardia, unspecified: Secondary | ICD-10-CM | POA: Insufficient documentation

## 2015-02-02 DIAGNOSIS — Z720 Tobacco use: Secondary | ICD-10-CM | POA: Insufficient documentation

## 2015-02-02 LAB — URINALYSIS, ROUTINE W REFLEX MICROSCOPIC
Bilirubin Urine: NEGATIVE
Glucose, UA: NEGATIVE mg/dL
Hgb urine dipstick: NEGATIVE
KETONES UR: NEGATIVE mg/dL
NITRITE: NEGATIVE
PH: 6 (ref 5.0–8.0)
Protein, ur: NEGATIVE mg/dL
Specific Gravity, Urine: 1.015 (ref 1.005–1.030)
Urobilinogen, UA: 0.2 mg/dL (ref 0.0–1.0)

## 2015-02-02 LAB — WET PREP, GENITAL
Trich, Wet Prep: NONE SEEN
YEAST WET PREP: NONE SEEN

## 2015-02-02 LAB — CBC WITH DIFFERENTIAL/PLATELET
Basophils Absolute: 0 10*3/uL (ref 0.0–0.1)
Basophils Relative: 0 % (ref 0–1)
EOS PCT: 1 % (ref 0–5)
Eosinophils Absolute: 0.1 10*3/uL (ref 0.0–0.7)
HCT: 37.8 % (ref 36.0–46.0)
Hemoglobin: 12.9 g/dL (ref 12.0–15.0)
LYMPHS PCT: 31 % (ref 12–46)
Lymphs Abs: 3.2 10*3/uL (ref 0.7–4.0)
MCH: 33.1 pg (ref 26.0–34.0)
MCHC: 34.1 g/dL (ref 30.0–36.0)
MCV: 96.9 fL (ref 78.0–100.0)
Monocytes Absolute: 0.7 10*3/uL (ref 0.1–1.0)
Monocytes Relative: 6 % (ref 3–12)
NEUTROS ABS: 6.5 10*3/uL (ref 1.7–7.7)
Neutrophils Relative %: 62 % (ref 43–77)
PLATELETS: 219 10*3/uL (ref 150–400)
RBC: 3.9 MIL/uL (ref 3.87–5.11)
RDW: 12.8 % (ref 11.5–15.5)
WBC: 10.5 10*3/uL (ref 4.0–10.5)

## 2015-02-02 LAB — URINE MICROSCOPIC-ADD ON

## 2015-02-02 LAB — POC URINE PREG, ED: PREG TEST UR: NEGATIVE

## 2015-02-02 MED ORDER — AZITHROMYCIN 250 MG PO TABS
1000.0000 mg | ORAL_TABLET | Freq: Once | ORAL | Status: AC
  Administered 2015-02-02: 1000 mg via ORAL
  Filled 2015-02-02: qty 4

## 2015-02-02 MED ORDER — CEFTRIAXONE SODIUM 1 G IJ SOLR
1.0000 g | Freq: Once | INTRAMUSCULAR | Status: AC
  Administered 2015-02-02: 1 g via INTRAMUSCULAR
  Filled 2015-02-02: qty 10

## 2015-02-02 MED ORDER — HYDROCODONE-ACETAMINOPHEN 5-325 MG PO TABS: 1.0000 | ORAL_TABLET | ORAL | Status: DC | PRN

## 2015-02-02 MED ORDER — NAPROXEN 500 MG PO TABS: 500.0000 mg | ORAL_TABLET | Freq: Two times a day (BID) | ORAL | Status: DC

## 2015-02-02 MED ORDER — LIDOCAINE HCL (PF) 1 % IJ SOLN
INTRAMUSCULAR | Status: AC
  Administered 2015-02-02: 23:00:00
  Filled 2015-02-02: qty 5

## 2015-02-02 MED ORDER — KETOROLAC TROMETHAMINE 60 MG/2ML IM SOLN
60.0000 mg | Freq: Once | INTRAMUSCULAR | Status: AC
  Administered 2015-02-02: 60 mg via INTRAMUSCULAR
  Filled 2015-02-02: qty 2

## 2015-02-02 MED ORDER — METRONIDAZOLE 500 MG PO TABS: 500.0000 mg | ORAL_TABLET | Freq: Two times a day (BID) | ORAL | Status: DC

## 2015-02-02 NOTE — ED Provider Notes (Signed)
CSN: [id number]     Arrival date & time 02/02/15  2013 History   First MD Initiated Contact with Patient 02/02/15 2047     Chief Complaint  Patient presents with  . Abdominal Pain     (Consider location/radiation/quality/duration/timing/severity/associated sxs/prior Treatment) Patient is a 24 y.o. female presenting with abdominal pain. The history is provided by the patient.  Abdominal Pain Pain location:  Suprapubic Pain quality: cramping and sharp   Pain radiates to:  Back Pain severity:  Severe Onset quality:  Gradual Duration:  2 weeks Timing:  Constant Progression:  Worsening Chronicity:  New Associated symptoms: dysuria and vaginal discharge    Debbie Santana is a 24 y.o. G1 P0 who presents to the ED with abdominal pain, back pain, dysuria and vaginal discharge. Last pap smear one year ago and was normal. Hx of HSV, HPV. She describes the vaginal d/c as yellow green and thick. Current sex partner x 3 weeks, unprotected sex. OC's for birth control.  Patient also complains of swelling of her feet but states that she has been on a cruise and that may have caused that.   History reviewed. No pertinent past medical history. Past Surgical History  Procedure Laterality Date  . Back surgery    . Hernia repair     No family history on file. History  Substance Use Topics  . Smoking status: Current Every Day Smoker -- 1.00 packs/day    Types: Cigarettes  . Smokeless tobacco: Not on file     Comment: never done snuff or chewing tobacco.  . Alcohol Use: No     Comment: occasionally   OB History    Gravida Para Term Preterm AB TAB SAB Ectopic Multiple Living   2    1  1         Review of Systems  Gastrointestinal: Positive for abdominal pain.  Genitourinary: Positive for dysuria, urgency and vaginal discharge.  Musculoskeletal: Positive for back pain.       Swelling of feet  all other systems negative    Allergies  Review of patient's allergies indicates no known  allergies.  Home Medications   Prior to Admission medications   Medication Sig Start Date End Date Taking? Authorizing Provider  acyclovir (ZOVIRAX) 400 MG tablet Take 1 tablet (400 mg total) by mouth 3 (three) times daily. 11/09/14  Yes Evalee Jefferson, PA-C  Norgestimate-Ethinyl Estradiol Triphasic 0.18/0.215/0.25 MG-35 MCG tablet Take 1 tablet by mouth daily.   Yes Historical Provider, MD  albendazole (ALBENZA) 200 MG tablet Take 2 tablets (400 mg total) by mouth once. Repeat in 2 wks if no improvement Patient not taking: Reported on 02/02/2015 04/15/14   Elnora Morrison, MD  azithromycin (ZITHROMAX) 250 MG tablet Take 1 tablet (250 mg total) by mouth daily. Take first 2 tablets together, then 1 every day until finished. Patient not taking: Reported on 02/02/2015 06/02/14   Tammy Triplett, PA-C  HYDROcodone-acetaminophen (NORCO/VICODIN) 5-325 MG per tablet Take 1 tablet by mouth every 4 (four) hours as needed. 02/02/15   Hope Bunnie Pion, NP  metroNIDAZOLE (FLAGYL) 500 MG tablet Take 1 tablet (500 mg total) by mouth 2 (two) times daily. 02/02/15   Hope Bunnie Pion, NP  naproxen (NAPROSYN) 500 MG tablet Take 1 tablet (500 mg total) by mouth 2 (two) times daily. 02/02/15   Hope Bunnie Pion, NP  ondansetron (ZOFRAN ODT) 4 MG disintegrating tablet 4mg  ODT q4 hours prn nausea/vomit Patient not taking: Reported on 02/02/2015 04/15/14   Elnora Morrison,  MD  ondansetron (ZOFRAN) 4 MG tablet Take 1 tablet (4 mg total) by mouth every 6 (six) hours. Patient not taking: Reported on 02/02/2015 10/25/13   Ezequiel Essex, MD  promethazine (PHENERGAN) 25 MG tablet Take 1 tablet (25 mg total) by mouth every 6 (six) hours as needed for nausea or vomiting. Patient not taking: Reported on 02/02/2015 01/20/14   Evalee Jefferson, PA-C   BP 140/85 mmHg  Pulse 103  Temp(Src) 98.3 F (36.8 C) (Oral)  Resp 18  Ht 5\' 6"  (1.676 m)  Wt 180 lb (81.647 kg)  BMI 29.07 kg/m2  SpO2 99%  LMP 01/19/2015 Physical Exam  Constitutional: She is oriented to  person, place, and time. She appears well-developed and well-nourished.  HENT:  Head: Normocephalic and atraumatic.  Eyes: Conjunctivae and EOM are normal.  Neck: Neck supple.  Cardiovascular: Regular rhythm.  Tachycardia present.   Pulmonary/Chest: Effort normal. She has wheezes. She has no rales.  Patient is an every day smoker  Abdominal: Soft. Bowel sounds are normal. There is tenderness in the suprapubic area. There is no rebound, no guarding and no CVA tenderness.  Genitourinary:  External genitalia with Bartholin's Cyst noted left that patient states is not painful. Moderate yellow, watery d/c vaginal vault, cervix inflamed, positive CMT, right adnexal tenderness. Uterus without palpable enlargement.   Musculoskeletal: Normal range of motion.  Neurological: She is alert and oriented to person, place, and time. No cranial nerve deficit.  Skin: Skin is warm and dry.  Psychiatric: She has a normal mood and affect. Her behavior is normal.  Nursing note and vitals reviewed.   ED Course  Procedures (including critical care time) Labs Review Results for orders placed or performed during the hospital encounter of 02/02/15 (from the past 24 hour(s))  Urinalysis, Routine w reflex microscopic     Status: Abnormal   Collection Time: 02/02/15  8:48 PM  Result Value Ref Range   Color, Urine YELLOW YELLOW   APPearance CLEAR CLEAR   Specific Gravity, Urine 1.015 1.005 - 1.030   pH 6.0 5.0 - 8.0   Glucose, UA NEGATIVE NEGATIVE mg/dL   Hgb urine dipstick NEGATIVE NEGATIVE   Bilirubin Urine NEGATIVE NEGATIVE   Ketones, ur NEGATIVE NEGATIVE mg/dL   Protein, ur NEGATIVE NEGATIVE mg/dL   Urobilinogen, UA 0.2 0.0 - 1.0 mg/dL   Nitrite NEGATIVE NEGATIVE   Leukocytes, UA TRACE (A) NEGATIVE  Urine microscopic-add on     Status: Abnormal   Collection Time: 02/02/15  8:48 PM  Result Value Ref Range   Squamous Epithelial / LPF FEW (A) RARE   WBC, UA 3-6 <3 WBC/hpf   Bacteria, UA RARE RARE  CBC  with Differential/Platelet     Status: None   Collection Time: 02/02/15  9:08 PM  Result Value Ref Range   WBC 10.5 4.0 - 10.5 K/uL   RBC 3.90 3.87 - 5.11 MIL/uL   Hemoglobin 12.9 12.0 - 15.0 g/dL   HCT 37.8 36.0 - 46.0 %   MCV 96.9 78.0 - 100.0 fL   MCH 33.1 26.0 - 34.0 pg   MCHC 34.1 30.0 - 36.0 g/dL   RDW 12.8 11.5 - 15.5 %   Platelets 219 150 - 400 K/uL   Neutrophils Relative % 62 43 - 77 %   Neutro Abs 6.5 1.7 - 7.7 K/uL   Lymphocytes Relative 31 12 - 46 %   Lymphs Abs 3.2 0.7 - 4.0 K/uL   Monocytes Relative 6 3 - 12 %  Monocytes Absolute 0.7 0.1 - 1.0 K/uL   Eosinophils Relative 1 0 - 5 %   Eosinophils Absolute 0.1 0.0 - 0.7 K/uL   Basophils Relative 0 0 - 1 %   Basophils Absolute 0.0 0.0 - 0.1 K/uL  POC urine preg, ED (not at [facility])     Status: None   Collection Time: 02/02/15  9:15 PM  Result Value Ref Range   Preg Test, Ur NEGATIVE NEGATIVE  Wet prep, genital     Status: Abnormal   Collection Time: 02/02/15  9:30 PM  Result Value Ref Range   Yeast Wet Prep HPF POC NONE SEEN NONE SEEN   Trich, Wet Prep NONE SEEN NONE SEEN   Clue Cells Wet Prep HPF POC FEW (A) NONE SEEN   WBC, Wet Prep HPF POC MANY (A) NONE SEEN      MDM  24 y.o. female with new sex partner x 3 week and vaginal d/c and abdominal pain x 2 weeks. Will treat for PID, cultures for GC and Chlamydia pending. Stable for d/c without fever or acute abdomen. Discussed with the patient clinical findings and plan of care and all questioned fully answered. She will call Dr. Johnnye Sima office for follow up. She will return here for worsening symptoms.    Final diagnoses:  PID (acute pelvic inflammatory disease)      Ashley Murrain, NP 02/02/15 2329  Veryl Speak, MD 02/05/15 [phone]

## 2015-02-02 NOTE — Discharge Instructions (Signed)
Your cultures will not be back for 24 hours. Someone will call you if they are positive. Take the medication as directed. Call Dr. Johnnye Sima office for follow up. Tell them you were evaluated in the ED and need a follow up appointment. Return here as needed.

## 2015-02-02 NOTE — ED Notes (Signed)
Burning with urination, abdominal pain, back pain, vaginal discharge, and my feet are swelling per pt. I was feeling like this a week before I went on a cruise and now it's worse per pt.

## 2015-02-04 LAB — RPR: RPR Ser Ql: NONREACTIVE

## 2015-02-04 LAB — HIV ANTIBODY (ROUTINE TESTING W REFLEX): HIV SCREEN 4TH GENERATION: NONREACTIVE

## 2015-02-06 LAB — GC/CHLAMYDIA PROBE AMP (~~LOC~~) NOT AT ARMC
CHLAMYDIA, DNA PROBE: NEGATIVE
Neisseria Gonorrhea: NEGATIVE

## 2015-02-13 ENCOUNTER — Other Ambulatory Visit: Payer: Self-pay | Admitting: Women's Health

## 2015-02-13 DIAGNOSIS — N739 Female pelvic inflammatory disease, unspecified: Secondary | ICD-10-CM

## 2015-02-15 ENCOUNTER — Ambulatory Visit (INDEPENDENT_AMBULATORY_CARE_PROVIDER_SITE_OTHER): Payer: Self-pay | Admitting: Women's Health

## 2015-02-15 ENCOUNTER — Ambulatory Visit: Payer: Medicaid Other

## 2015-02-15 ENCOUNTER — Encounter: Payer: Self-pay | Admitting: Women's Health

## 2015-02-15 DIAGNOSIS — R102 Pelvic and perineal pain: Secondary | ICD-10-CM

## 2015-02-15 DIAGNOSIS — R300 Dysuria: Secondary | ICD-10-CM

## 2015-02-15 LAB — POCT URINALYSIS DIPSTICK
Blood, UA: NEGATIVE
Glucose, UA: NEGATIVE
Ketones, UA: NEGATIVE
Leukocytes, UA: NEGATIVE
Nitrite, UA: NEGATIVE
Protein, UA: NEGATIVE

## 2015-02-15 NOTE — Progress Notes (Signed)
Patient ID: Debbie Santana, female   DOB: 1990/10/13, 24 y.o.   MRN: [id number]   [facility] Visit  Patient name: Debbie Santana MRN [id number]  Date of birth: 22-Mar-1991  CC & HPI:  Debbie Santana is a 24 y.o. [id number] Caucasian female presenting today for f/u after ED visit for pelvic pain/cramping, dysuria, vag d/c after new sexual partner 3wks earlier- gc/ct, preg test, ua, hiv, cbc neg at that time, was treated for pid- has finished all meds. Reports pain is improving but still having some cramping & sharp lower pelvic pains and pressure w/ urination. LSIL w/ correlating biopsies 01/2014 & 09/2014 at [facility]- doesn't think she's had any other procedures done. Is supposed to f/u w/ [facility]. Denies fever/chills/n/v/d. Vaginal discharge has resolved, denies itching/odor/irritation.  Had pelvic u/s scheduled for today's visit but only has [facility] discount which will not cover office u/s.   Pertinent History Reviewed:  Medical & Surgical Hx:   History reviewed. No pertinent past medical history. Past Surgical History  Procedure Laterality Date  . Back surgery    . Hernia repair     Medications: Reviewed & Updated - see associated section Social History: Reviewed -  reports that she has been smoking Cigarettes.  She has been smoking about 1.00 pack per day. She does not have any smokeless tobacco history on file.  Objective Findings:  Vitals: LMP 01/19/2015  Physical Examination: General appearance - alert, well appearing, and in no distress Abdomen - generalized slight tenderness to palpation over lower abd/pelvis, R>L  Results for orders placed or performed in visit on 02/15/15 (from the past 24 hour(s))  POCT Urinalysis Dipstick   Collection Time: 02/15/15  2:45 PM  Result Value Ref Range   Color, UA     Clarity, UA     Glucose, UA neg    Bilirubin, UA     Ketones, UA neg    Spec Grav, UA     Blood, UA neg    pH, UA     Protein, UA neg    Urobilinogen, UA     Nitrite, UA neg    Leukocytes, UA Negative      Assessment & Plan:  A:   Pelvic pain, recently dx & tx for PID  H/O abnormal paps & bx->LSIL P:  Will repeat gc/ct and send urine culture  Called [facility] and scheduled pelvic u/s for 6/1 @ 1:30  Discussed s/s to report, reasons to seek care  Will call her w/ u/s and lab results  F/U w/ [facility] as directed for paps   Tawnya Crook CNM, [facility] 02/15/2015 3:15 PM

## 2015-02-15 NOTE — Patient Instructions (Signed)
Pelvic ultrasound Wed 6/1 @ 1:30, be there at 1:15. Drink 32oz of water 1 hour before your appointment, and do not pee until they tell you it's OK

## 2015-02-18 LAB — GC/CHLAMYDIA PROBE AMP
Chlamydia trachomatis, NAA: NEGATIVE
Neisseria gonorrhoeae by PCR: NEGATIVE

## 2015-02-18 LAB — PLEASE NOTE

## 2015-02-21 LAB — PLEASE NOTE

## 2015-02-22 ENCOUNTER — Other Ambulatory Visit: Payer: Self-pay | Admitting: Women's Health

## 2015-02-22 ENCOUNTER — Other Ambulatory Visit (HOSPITAL_COMMUNITY): Payer: Medicaid Other

## 2015-02-22 ENCOUNTER — Ambulatory Visit (HOSPITAL_COMMUNITY)
Admission: RE | Admit: 2015-02-22 | Discharge: 2015-02-22 | Disposition: A | Discharge status: Home / Self Care | Payer: Medicaid Other | Source: Ambulatory Visit | Attending: Women's Health | Admitting: Women's Health

## 2015-02-22 DIAGNOSIS — R102 Pelvic and perineal pain: Secondary | ICD-10-CM | POA: Insufficient documentation

## 2015-02-22 DIAGNOSIS — N739 Female pelvic inflammatory disease, unspecified: Secondary | ICD-10-CM

## 2015-02-22 DIAGNOSIS — N8320 Unspecified ovarian cysts: Secondary | ICD-10-CM | POA: Insufficient documentation

## 2015-02-27 ENCOUNTER — Telehealth: Payer: Self-pay | Admitting: Women's Health

## 2015-02-27 NOTE — Telephone Encounter (Signed)
LM for pt to return call to discuss labs/us report.  Roma Schanz, CNM, [facility] 02/27/2015 1:31 PM

## 2015-02-28 NOTE — Telephone Encounter (Signed)
Notified pt of normal pelvic u/s and neg gc/ct. Urine cx did not get sent. Pt states she is feeling much better, so will hold off on having her repeat sample. She states she does not have appt w/ [facility] to f/u w/ abnormal pap from Jan- she is directed to call them to see when she needs to go back in. Can call us w/ any questions/problems.  Roma Schanz, CNM, [facility] 02/28/2015 2:21 PM

## 2015-05-13 ENCOUNTER — Emergency Department (HOSPITAL_COMMUNITY)
Admission: EM | Admit: 2015-05-13 | Discharge: 2015-05-14 | Disposition: A | Discharge status: Home / Self Care | Payer: Medicaid Other | Attending: Emergency Medicine | Admitting: Emergency Medicine

## 2015-05-13 ENCOUNTER — Encounter (HOSPITAL_COMMUNITY): Payer: Self-pay | Admitting: Emergency Medicine

## 2015-05-13 DIAGNOSIS — Z793 Long term (current) use of hormonal contraceptives: Secondary | ICD-10-CM | POA: Insufficient documentation

## 2015-05-13 DIAGNOSIS — Z720 Tobacco use: Secondary | ICD-10-CM | POA: Insufficient documentation

## 2015-05-13 DIAGNOSIS — Z9889 Other specified postprocedural states: Secondary | ICD-10-CM | POA: Insufficient documentation

## 2015-05-13 DIAGNOSIS — Z3202 Encounter for pregnancy test, result negative: Secondary | ICD-10-CM | POA: Insufficient documentation

## 2015-05-13 DIAGNOSIS — M545 Low back pain, unspecified: Secondary | ICD-10-CM

## 2015-05-13 NOTE — ED Notes (Signed)
Patient complaining of left lower back pain x 3 days. History of back pain and back surgery. States pain is radiating up to mid back and towards left shoulder.

## 2015-05-13 NOTE — ED Provider Notes (Signed)
CSN: [id number]     Arrival date & time 05/13/15  2301 History  This chart was scribed for Jola Schmidt, MD by Irene Pap, ED Scribe. This patient was seen in room APA06/APA06 and patient care was started at 12:17 AM.    Chief Complaint  Patient presents with  . Back Pain   The history is provided by the patient. No language interpreter was used.  HPI Comments: Debbie Santana is a 24 y.o. Female with hx of back surgery and back pain who presents to the Emergency Department complaining of lower thoracic and lower lumbar pain that radiates up the left side onset 3 days ago. Reports pain is radiating to mid back and left shoulder. She states that she was unable to go to work today and has been waking up due to pain. She states that she feels "jolts" going down her back. Reports that this is worse than her normal chronic back pain. States that she has been taking Tylenol PM and ibuprofen to no relief, even though they typically control her pain. Denies vaginal discharge, vaginal bleeding, dysuria, dyspareunia, bladder or bowel incontinence, fever, or vomiting. States that she had her herniated disc repair when she was 34 at [facility], but has not been able to continue care with a PCP due to loss of insurance.   History reviewed. No pertinent past medical history. Past Surgical History  Procedure Laterality Date  . Back surgery    . Hernia repair     History reviewed. No pertinent family history. Social History  Substance Use Topics  . Smoking status: Current Every Day Smoker -- 1.00 packs/day    Types: Cigarettes  . Smokeless tobacco: None     Comment: never done snuff or chewing tobacco.  . Alcohol Use: No     Comment: occasionally   OB History    Gravida Para Term Preterm AB TAB SAB Ectopic Multiple Living   1    1  1         Review of Systems  Musculoskeletal: Positive for back pain.  All other systems reviewed and are negative.     Allergies  Review of patient's allergies  indicates no known allergies.  Home Medications   Prior to Admission medications   Medication Sig Start Date End Date Taking? Authorizing Provider  Norgestimate-Ethinyl Estradiol Triphasic 0.18/0.215/0.25 MG-35 MCG tablet Take 1 tablet by mouth daily.   Yes Historical Provider, MD   BP 135/87 mmHg  Pulse 94  Temp(Src) 98.4 F (36.9 C) (Oral)  Resp 16  Wt 195 lb (88.451 kg)  SpO2 100%  LMP 03/30/2015  Physical Exam  Constitutional: She is oriented to person, place, and time. She appears well-developed and well-nourished.  HENT:  Head: Normocephalic and atraumatic.  Eyes: Conjunctivae and EOM are normal. Pupils are equal, round, and reactive to light.  Neck: Normal range of motion. Neck supple.  Cardiovascular: Normal rate and regular rhythm.   Pulmonary/Chest: Effort normal and breath sounds normal.  Abdominal: Soft. Bowel sounds are normal.  Musculoskeletal: Normal range of motion.  No lumbar tenderness, mild paralumbar tenderness on the left. No rash or ecchymosis noted.   Neurological: She is alert and oriented to person, place, and time.  Skin: Skin is warm and dry.  Psychiatric: She has a normal mood and affect. Her behavior is normal.  Nursing note and vitals reviewed.   ED Course  Procedures (including critical care time) DIAGNOSTIC STUDIES: Oxygen Saturation is 100% on RA, normal by my  interpretation.    COORDINATION OF CARE: 12:22 AM-Discussed treatment plan which includes UA, muscle relaxants, and anti-inflammatories with pt at bedside and pt agreed to plan.    Labs Review Labs Reviewed  CBC WITH DIFFERENTIAL/PLATELET - Abnormal; Notable for the following:    WBC 10.7 (*)    Hemoglobin 15.2 (*)    Lymphs Abs 4.3 (*)    All other components within normal limits  URINALYSIS, ROUTINE W REFLEX MICROSCOPIC (NOT AT [facility] - Abnormal; Notable for the following:    Specific Gravity, Urine <1.005 (*)    All other components within normal limits  BASIC METABOLIC  PANEL  PREGNANCY, URINE    Imaging Review No results found.    EKG Interpretation None      MDM   Final diagnoses:  None    Suspect musculoskeletal low back pain without sciatica.  No indication for imaging.  Abdomen is benign.  Labs and urine without abnormalities.  Discharge home in good condition.  Primary care follow-up.  Home with muscle relaxants and anti-inflammatories.  I personally performed the services described in this documentation, which was scribed in my presence. The recorded information has been reviewed and is accurate.      Jola Schmidt, MD 05/14/15 [phone]

## 2015-05-14 LAB — BASIC METABOLIC PANEL
Anion gap: 8 (ref 5–15)
BUN: 8 mg/dL (ref 6–20)
CALCIUM: 9.1 mg/dL (ref 8.9–10.3)
CO2: 24 mmol/L (ref 22–32)
Chloride: 105 mmol/L (ref 101–111)
Creatinine, Ser: 0.86 mg/dL (ref 0.44–1.00)
GFR calc Af Amer: >60 mL/min (ref >60)
GFR calc non Af Amer: >60 mL/min (ref >60)
GLUCOSE: 85 mg/dL (ref 65–99)
Potassium: 3.5 mmol/L (ref 3.5–5.1)
Sodium: 137 mmol/L (ref 135–145)

## 2015-05-14 LAB — CBC WITH DIFFERENTIAL/PLATELET
Basophils Absolute: 0 10*3/uL (ref 0.0–0.1)
Basophils Relative: 0 % (ref 0–1)
Eosinophils Absolute: 0.2 10*3/uL (ref 0.0–0.7)
Eosinophils Relative: 2 % (ref 0–5)
HCT: 43.7 % (ref 36.0–46.0)
Hemoglobin: 15.2 g/dL — ABNORMAL HIGH (ref 12.0–15.0)
LYMPHS ABS: 4.3 10*3/uL — AB (ref 0.7–4.0)
LYMPHS PCT: 41 % (ref 12–46)
MCH: 33.7 pg (ref 26.0–34.0)
MCHC: 34.8 g/dL (ref 30.0–36.0)
MCV: 96.9 fL (ref 78.0–100.0)
MONO ABS: 0.7 10*3/uL (ref 0.1–1.0)
MONOS PCT: 7 % (ref 3–12)
Neutro Abs: 5.5 10*3/uL (ref 1.7–7.7)
Neutrophils Relative %: 50 % (ref 43–77)
PLATELETS: 234 10*3/uL (ref 150–400)
RBC: 4.51 MIL/uL (ref 3.87–5.11)
RDW: 12.2 % (ref 11.5–15.5)
WBC: 10.7 10*3/uL — ABNORMAL HIGH (ref 4.0–10.5)

## 2015-05-14 LAB — URINALYSIS, ROUTINE W REFLEX MICROSCOPIC
Bilirubin Urine: NEGATIVE
GLUCOSE, UA: NEGATIVE mg/dL
Hgb urine dipstick: NEGATIVE
Ketones, ur: NEGATIVE mg/dL
LEUKOCYTES UA: NEGATIVE
Nitrite: NEGATIVE
PH: 6.5 (ref 5.0–8.0)
Protein, ur: NEGATIVE mg/dL
Specific Gravity, Urine: <1.005 — ABNORMAL LOW (ref 1.005–1.030)
Urobilinogen, UA: 0.2 mg/dL (ref 0.0–1.0)

## 2015-05-14 LAB — PREGNANCY, URINE: Preg Test, Ur: NEGATIVE

## 2015-05-14 MED ORDER — DIAZEPAM 5 MG PO TABS
5.0000 mg | ORAL_TABLET | Freq: Once | ORAL | Status: AC
Start: 2015-05-14 — End: 2015-05-14
  Administered 2015-05-14: 5 mg via ORAL
  Filled 2015-05-14: qty 1

## 2015-05-14 MED ORDER — HYDROCODONE-ACETAMINOPHEN 5-325 MG PO TABS
1.0000 | ORAL_TABLET | Freq: Once | ORAL | Status: AC
  Administered 2015-05-14: 1 via ORAL
  Filled 2015-05-14: qty 1

## 2015-05-14 MED ORDER — IBUPROFEN 600 MG PO TABS: 600.0000 mg | ORAL_TABLET | Freq: Three times a day (TID) | ORAL | Status: DC | PRN

## 2015-05-14 MED ORDER — KETOROLAC TROMETHAMINE 60 MG/2ML IM SOLN
60.0000 mg | Freq: Once | INTRAMUSCULAR | Status: AC
  Administered 2015-05-14: 60 mg via INTRAMUSCULAR
  Filled 2015-05-14: qty 2

## 2015-05-14 MED ORDER — CYCLOBENZAPRINE HCL 10 MG PO TABS: 10.0000 mg | ORAL_TABLET | Freq: Three times a day (TID) | ORAL | Status: DC | PRN

## 2015-05-14 NOTE — Discharge Instructions (Signed)
Lumbosacral Strain Lumbosacral strain is a strain of any of the parts that make up your lumbosacral vertebrae. Your lumbosacral vertebrae are the bones that make up the lower third of your backbone. Your lumbosacral vertebrae are held together by muscles and tough, fibrous tissue (ligaments).  CAUSES  A sudden blow to your back can cause lumbosacral strain. Also, anything that causes an excessive stretch of the muscles in the low back can cause this strain. This is typically seen when people exert themselves strenuously, fall, lift heavy objects, bend, or crouch repeatedly. RISK FACTORS  Physically demanding work.  Participation in pushing or pulling sports or sports that require a sudden twist of the back (tennis, golf, baseball).  Weight lifting.  Excessive lower back curvature.  Forward-tilted pelvis.  Weak back or abdominal muscles or both.  Tight hamstrings. SIGNS AND SYMPTOMS  Lumbosacral strain may cause pain in the area of your injury or pain that moves (radiates) down your leg.  DIAGNOSIS Your health care provider can often diagnose lumbosacral strain through a physical exam. In some cases, you may need tests such as X-ray exams.  TREATMENT  Treatment for your lower back injury depends on many factors that your clinician will have to evaluate. However, most treatment will include the use of anti-inflammatory medicines. HOME CARE INSTRUCTIONS   Avoid hard physical activities (tennis, racquetball, waterskiing) if you are not in proper physical condition for it. This may aggravate or create problems.  If you have a back problem, avoid sports requiring sudden body movements. Swimming and walking are generally safer activities.  Maintain good posture.  Maintain a healthy weight.  For acute conditions, you may put ice on the injured area.  Put ice in a plastic bag.  Place a towel between your skin and the bag.  Leave the ice on for 20 minutes, 2-3 times a day.  When  the low back starts healing, stretching and strengthening exercises may be recommended. SEEK MEDICAL CARE IF:  Your back pain is getting worse.  You experience severe back pain not relieved with medicines. SEEK IMMEDIATE MEDICAL CARE IF:   You have numbness, tingling, weakness, or problems with the use of your arms or legs.  There is a change in bowel or bladder control.  You have increasing pain in any area of the body, including your belly (abdomen).  You notice shortness of breath, dizziness, or feel faint.  You feel sick to your stomach (nauseous), are throwing up (vomiting), or become sweaty.  You notice discoloration of your toes or legs, or your feet get very cold. MAKE SURE YOU:   Understand these instructions.  Will watch your condition.  Will get help right away if you are not doing well or get worse. Document Released: 06/19/2005 Document Revised: 09/14/2013 Document Reviewed: 04/28/2013 [facility] Patient Information 2015 [place], [place]. This information is not intended to replace advice given to you by your health care provider. Make sure you discuss any questions you have with your health care provider.  Heat Therapy Heat therapy can help ease sore, stiff, injured, and tight muscles and joints. Heat relaxes your muscles, which may help ease your pain.  RISKS AND COMPLICATIONS If you have any of the following conditions, do not use heat therapy unless your health care provider has approved:  Poor circulation.  Healing wounds or scarred skin in the area being treated.  Diabetes, heart disease, or high blood pressure.  Not being able to feel (numbness) the area being treated.  Unusual swelling  of the area being treated.  Active infections.  Blood clots.  Cancer.  Inability to communicate pain. This may include young children and people who have problems with their brain function (dementia).  Pregnancy. Heat therapy should only be used on old,  pre-existing, or long-lasting (chronic) injuries. Do not use heat therapy on new injuries unless directed by your health care provider. HOW TO USE HEAT THERAPY There are several different kinds of heat therapy, including:  Moist heat pack.  Warm water bath.  Hot water bottle.  Electric heating pad.  Heated gel pack.  Heated wrap.  Electric heating pad. Use the heat therapy method suggested by your health care provider. Follow your health care provider's instructions on when and how to use heat therapy. GENERAL HEAT THERAPY RECOMMENDATIONS  Do not sleep while using heat therapy. Only use heat therapy while you are awake.  Your skin may turn pink while using heat therapy. Do not use heat therapy if your skin turns red.  Do not use heat therapy if you have new pain.  High heat or long exposure to heat can cause burns. Be careful when using heat therapy to avoid burning your skin.  Do not use heat therapy on areas of your skin that are already irritated, such as with a rash or sunburn. SEEK MEDICAL CARE IF:  You have blisters, redness, swelling, or numbness.  You have new pain.  Your pain is worse. MAKE SURE YOU:  Understand these instructions.  Will watch your condition.  Will get help right away if you are not doing well or get worse. Document Released: 12/02/2011 Document Revised: 01/24/2014 Document Reviewed: 11/02/2013 [facility] Patient Information 2015 [place], [place]. This information is not intended to replace advice given to you by your health care provider. Make sure you discuss any questions you have with your health care provider.

## 2015-09-01 ENCOUNTER — Emergency Department (HOSPITAL_COMMUNITY)
Admission: EM | Admit: 2015-09-01 | Discharge: 2015-09-01 | Disposition: A | Discharge status: Home / Self Care | Payer: Medicaid Other | Attending: Emergency Medicine | Admitting: Emergency Medicine

## 2015-09-01 ENCOUNTER — Encounter (HOSPITAL_COMMUNITY): Payer: Self-pay | Admitting: Emergency Medicine

## 2015-09-01 DIAGNOSIS — F17210 Nicotine dependence, cigarettes, uncomplicated: Secondary | ICD-10-CM | POA: Insufficient documentation

## 2015-09-01 DIAGNOSIS — Z3202 Encounter for pregnancy test, result negative: Secondary | ICD-10-CM | POA: Insufficient documentation

## 2015-09-01 DIAGNOSIS — R1013 Epigastric pain: Secondary | ICD-10-CM | POA: Insufficient documentation

## 2015-09-01 LAB — BASIC METABOLIC PANEL
Anion gap: 7 (ref 5–15)
BUN: 7 mg/dL (ref 6–20)
CO2: 28 mmol/L (ref 22–32)
Calcium: 9.5 mg/dL (ref 8.9–10.3)
Chloride: 105 mmol/L (ref 101–111)
Creatinine, Ser: 0.85 mg/dL (ref 0.44–1.00)
GFR calc Af Amer: >60 mL/min (ref >60)
GFR calc non Af Amer: >60 mL/min (ref >60)
Glucose, Bld: 83 mg/dL (ref 65–99)
Potassium: 3.6 mmol/L (ref 3.5–5.1)
Sodium: 140 mmol/L (ref 135–145)

## 2015-09-01 LAB — CBC WITH DIFFERENTIAL/PLATELET
Basophils Absolute: 0 10*3/uL (ref 0.0–0.1)
Basophils Relative: 0 %
Eosinophils Absolute: 0.2 10*3/uL (ref 0.0–0.7)
Eosinophils Relative: 2 %
HCT: 40.7 % (ref 36.0–46.0)
Hemoglobin: 14 g/dL (ref 12.0–15.0)
Lymphocytes Relative: 35 %
Lymphs Abs: 3.9 10*3/uL (ref 0.7–4.0)
MCH: 32.9 pg (ref 26.0–34.0)
MCHC: 34.4 g/dL (ref 30.0–36.0)
MCV: 95.5 fL (ref 78.0–100.0)
Monocytes Absolute: 0.7 10*3/uL (ref 0.1–1.0)
Monocytes Relative: 7 %
Neutro Abs: 6.3 10*3/uL (ref 1.7–7.7)
Neutrophils Relative %: 56 %
Platelets: 247 10*3/uL (ref 150–400)
RBC: 4.26 MIL/uL (ref 3.87–5.11)
RDW: 12.6 % (ref 11.5–15.5)
WBC: 11.2 10*3/uL — ABNORMAL HIGH (ref 4.0–10.5)

## 2015-09-01 LAB — URINE MICROSCOPIC-ADD ON

## 2015-09-01 LAB — PREGNANCY, URINE: Preg Test, Ur: NEGATIVE

## 2015-09-01 LAB — URINALYSIS, ROUTINE W REFLEX MICROSCOPIC
Bilirubin Urine: NEGATIVE
Glucose, UA: NEGATIVE mg/dL
Ketones, ur: NEGATIVE mg/dL
Leukocytes, UA: NEGATIVE
Nitrite: NEGATIVE
Protein, ur: NEGATIVE mg/dL
Specific Gravity, Urine: 1.025 (ref 1.005–1.030)
pH: 6.5 (ref 5.0–8.0)

## 2015-09-01 LAB — HEPATIC FUNCTION PANEL
ALT: 19 U/L (ref 14–54)
AST: 16 U/L (ref 15–41)
Albumin: 4.1 g/dL (ref 3.5–5.0)
Alkaline Phosphatase: 62 U/L (ref 38–126)
Bilirubin, Direct: 0.1 mg/dL (ref 0.1–0.5)
Indirect Bilirubin: 0.4 mg/dL (ref 0.3–0.9)
Total Bilirubin: 0.5 mg/dL (ref 0.3–1.2)
Total Protein: 7.1 g/dL (ref 6.5–8.1)

## 2015-09-01 LAB — LIPASE, BLOOD: Lipase: 43 U/L (ref 11–51)

## 2015-09-01 MED ORDER — GI COCKTAIL ~~LOC~~
30.0000 mL | Freq: Once | ORAL | Status: AC
  Administered 2015-09-01: 30 mL via ORAL
  Filled 2015-09-01: qty 30

## 2015-09-01 MED ORDER — HYDROMORPHONE HCL 1 MG/ML IJ SOLN
INTRAMUSCULAR | Status: AC
  Administered 2015-09-01: 22:00:00
  Filled 2015-09-01: qty 1

## 2015-09-01 MED ORDER — HYDROMORPHONE HCL 1 MG/ML IJ SOLN
1.0000 mg | Freq: Once | INTRAMUSCULAR | Status: AC
  Administered 2015-09-01: 1 mg via INTRAVENOUS
  Filled 2015-09-01: qty 1

## 2015-09-01 MED ORDER — FAMOTIDINE IN NACL 20-0.9 MG/50ML-% IV SOLN
20.0000 mg | Freq: Once | INTRAVENOUS | Status: AC
  Administered 2015-09-01: 20 mg via INTRAVENOUS
  Filled 2015-09-01: qty 50

## 2015-09-01 MED ORDER — PANTOPRAZOLE SODIUM 20 MG PO TBEC: 20.0000 mg | DELAYED_RELEASE_TABLET | Freq: Two times a day (BID) | ORAL | Status: DC

## 2015-09-01 MED ORDER — HYDROMORPHONE HCL 1 MG/ML IJ SOLN
1.0000 mg | Freq: Once | INTRAMUSCULAR | Status: AC
  Administered 2015-09-01: 1 mg via INTRAVENOUS

## 2015-09-01 MED ORDER — PANTOPRAZOLE SODIUM 40 MG IV SOLR
40.0000 mg | Freq: Once | INTRAVENOUS | Status: AC
  Administered 2015-09-01: 40 mg via INTRAVENOUS
  Filled 2015-09-01: qty 40

## 2015-09-01 NOTE — ED Notes (Signed)
Pt reports epigastric pain, n/v/d since yesterday. Pt also reports dysuria. Pt states, "it feels like I have a lot of acid."

## 2015-09-12 NOTE — ED Provider Notes (Signed)
CSN: [id number]     Arrival date & time 09/01/15  1731 History   First MD Initiated Contact with Patient 09/01/15 1838     Chief Complaint  Patient presents with  . Abdominal Pain     (Consider location/radiation/quality/duration/timing/severity/associated sxs/prior Treatment) HPI   24yF with abdominal pain. Epigastric. onset Yesterday. Constant since. Describes as burning. No appreciable exacerbating or relieving factors. Associated n/v/d. No fever or chills. No cp. No respiratory complaints. No blood in stool or emesis. No sick contacts.  History reviewed. No pertinent past medical history. Past Surgical History  Procedure Laterality Date  . Back surgery    . Hernia repair     No family history on file. Social History  Substance Use Topics  . Smoking status: Current Every Day Smoker -- 1.00 packs/day    Types: Cigarettes  . Smokeless tobacco: None     Comment: never done snuff or chewing tobacco.  . Alcohol Use: No     Comment: occasionally   OB History    Gravida Para Term Preterm AB TAB SAB Ectopic Multiple Living   1    1  1         Review of Systems  All systems reviewed and negative, other than as noted in HPI.   Allergies  Review of patient's allergies indicates no known allergies.  Home Medications   Prior to Admission medications   Medication Sig Start Date End Date Taking? Authorizing Provider  meclizine (ANTIVERT) 12.5 MG tablet Take 12.5 mg by mouth 3 (three) times daily as needed for dizziness or nausea.   Yes Historical Provider, MD  cyclobenzaprine (FLEXERIL) 10 MG tablet Take 1 tablet (10 mg total) by mouth 3 (three) times daily as needed for muscle spasms. Patient not taking: Reported on 09/01/2015 05/14/15   Jola Schmidt, MD  ibuprofen (ADVIL,MOTRIN) 600 MG tablet Take 1 tablet (600 mg total) by mouth every 8 (eight) hours as needed. Patient not taking: Reported on 09/01/2015 05/14/15   Jola Schmidt, MD  pantoprazole (PROTONIX) 20 MG tablet Take 1  tablet (20 mg total) by mouth 2 (two) times daily. 09/01/15   Virgel Manifold, MD   BP 126/78 mmHg  Pulse 76  Temp(Src) 98.1 F (36.7 C) (Oral)  Resp 18  Ht 5\' 9"  (1.753 m)  Wt 205 lb (92.987 kg)  BMI 30.26 kg/m2  SpO2 100%  LMP 09/01/2015 Physical Exam  Constitutional: She appears well-developed and well-nourished. No distress.  HENT:  Head: Normocephalic and atraumatic.  Eyes: Conjunctivae are normal. Right eye exhibits no discharge. Left eye exhibits no discharge.  Neck: Neck supple.  Cardiovascular: Normal rate, regular rhythm and normal heart sounds.  Exam reveals no gallop and no friction rub.   No murmur heard. Pulmonary/Chest: Effort normal and breath sounds normal. No respiratory distress.  Abdominal: Soft. She exhibits no distension. There is tenderness.  Mild epigastric tenderness w/o rebound or guarding.   Musculoskeletal: She exhibits no edema or tenderness.  Neurological: She is alert.  Skin: Skin is warm and dry.  Psychiatric: She has a normal mood and affect. Her behavior is normal. Thought content normal.  Nursing note and vitals reviewed.   ED Course  Procedures (including critical care time) Labs Review Labs Reviewed  CBC WITH DIFFERENTIAL/PLATELET - Abnormal; Notable for the following:    WBC 11.2 (*)    All other components within normal limits  URINALYSIS, ROUTINE W REFLEX MICROSCOPIC (NOT AT [facility]) - Abnormal; Notable for the following:    Hgb urine  dipstick LARGE (*)    All other components within normal limits  URINE MICROSCOPIC-ADD ON - Abnormal; Notable for the following:    Squamous Epithelial / LPF 6-30 (*)    Bacteria, UA FEW (*)    All other components within normal limits  BASIC METABOLIC PANEL  PREGNANCY, URINE  LIPASE, BLOOD  HEPATIC FUNCTION PANEL    Imaging Review No results found. I have personally reviewed and evaluated these images and lab results as part of my medical decision-making.   EKG Interpretation   Date/Time:   Friday September 01 2015 19:58:07 EST Ventricular Rate:  60 PR Interval:  120 QRS Duration: 89 QT Interval:  411 QTC Calculation: 411 R Axis:   65 Text Interpretation:  Sinus rhythm ED PHYSICIAN INTERPRETATION AVAILABLE  IN [facility] Confirmed by TEST, Record ([id number]) on 09/02/2015 9:22:16  AM      MDM   Final diagnoses:  Epigastric pain    24yf with epigastric pain. Suspect pud. Doubt atypical acs or serious intraabdominal pathology. It has been determined that no acute conditions requiring further emergency intervention are present at this time. The patient has been advised of the diagnosis and plan. I reviewed any labs and imaging including any potential incidental findings. We have discussed signs and symptoms that warrant return to the ED and they are listed in the discharge instructions.      Virgel Manifold, MD 09/12/15 [phone]

## 2015-10-18 ENCOUNTER — Emergency Department (HOSPITAL_COMMUNITY)
Admission: EM | Admit: 2015-10-18 | Discharge: 2015-10-18 | Disposition: A | Discharge status: Home / Self Care | Payer: Medicaid Other | Attending: Emergency Medicine | Admitting: Emergency Medicine

## 2015-10-18 ENCOUNTER — Encounter (HOSPITAL_COMMUNITY): Payer: Self-pay | Admitting: Emergency Medicine

## 2015-10-18 ENCOUNTER — Emergency Department (HOSPITAL_COMMUNITY): Payer: Medicaid Other

## 2015-10-18 DIAGNOSIS — Z3202 Encounter for pregnancy test, result negative: Secondary | ICD-10-CM | POA: Insufficient documentation

## 2015-10-18 DIAGNOSIS — F17210 Nicotine dependence, cigarettes, uncomplicated: Secondary | ICD-10-CM | POA: Insufficient documentation

## 2015-10-18 DIAGNOSIS — R110 Nausea: Secondary | ICD-10-CM | POA: Insufficient documentation

## 2015-10-18 DIAGNOSIS — R102 Pelvic and perineal pain: Secondary | ICD-10-CM | POA: Insufficient documentation

## 2015-10-18 LAB — URINALYSIS, ROUTINE W REFLEX MICROSCOPIC
BILIRUBIN URINE: NEGATIVE
GLUCOSE, UA: NEGATIVE mg/dL
HGB URINE DIPSTICK: NEGATIVE
Ketones, ur: NEGATIVE mg/dL
Leukocytes, UA: NEGATIVE
Nitrite: NEGATIVE
PH: 6 (ref 5.0–8.0)
Protein, ur: NEGATIVE mg/dL
SPECIFIC GRAVITY, URINE: 1.025 (ref 1.005–1.030)

## 2015-10-18 LAB — COMPREHENSIVE METABOLIC PANEL
ALBUMIN: 3.5 g/dL (ref 3.5–5.0)
ALK PHOS: 50 U/L (ref 38–126)
ALT: 23 U/L (ref 14–54)
ANION GAP: 6 (ref 5–15)
AST: 20 U/L (ref 15–41)
BUN: 7 mg/dL (ref 6–20)
CHLORIDE: 107 mmol/L (ref 101–111)
CO2: 25 mmol/L (ref 22–32)
Calcium: 8.9 mg/dL (ref 8.9–10.3)
Creatinine, Ser: 0.85 mg/dL (ref 0.44–1.00)
GFR calc non Af Amer: >60 mL/min (ref >60)
GLUCOSE: 103 mg/dL — AB (ref 65–99)
POTASSIUM: 3.9 mmol/L (ref 3.5–5.1)
SODIUM: 138 mmol/L (ref 135–145)
Total Bilirubin: 0.5 mg/dL (ref 0.3–1.2)
Total Protein: 5.9 g/dL — ABNORMAL LOW (ref 6.5–8.1)

## 2015-10-18 LAB — CBC
HEMATOCRIT: 42.6 % (ref 36.0–46.0)
HEMOGLOBIN: 14.5 g/dL (ref 12.0–15.0)
MCH: 32 pg (ref 26.0–34.0)
MCHC: 34 g/dL (ref 30.0–36.0)
MCV: 94 fL (ref 78.0–100.0)
Platelets: 220 10*3/uL (ref 150–400)
RBC: 4.53 MIL/uL (ref 3.87–5.11)
RDW: 12.6 % (ref 11.5–15.5)
WBC: 10.8 10*3/uL — ABNORMAL HIGH (ref 4.0–10.5)

## 2015-10-18 LAB — PREGNANCY, URINE: PREG TEST UR: NEGATIVE

## 2015-10-18 LAB — LIPASE, BLOOD: LIPASE: 21 U/L (ref 11–51)

## 2015-10-18 MED ORDER — DIATRIZOATE MEGLUMINE & SODIUM 66-10 % PO SOLN
ORAL | Status: AC
  Administered 2015-10-18: 30 mL
  Filled 2015-10-18: qty 30

## 2015-10-18 MED ORDER — AZITHROMYCIN 250 MG PO TABS
1000.0000 mg | ORAL_TABLET | Freq: Once | ORAL | Status: AC
  Administered 2015-10-18: 1000 mg via ORAL
  Filled 2015-10-18: qty 4

## 2015-10-18 MED ORDER — HYDROCODONE-ACETAMINOPHEN 5-325 MG PO TABS: 1.0000 | ORAL_TABLET | Freq: Four times a day (QID) | ORAL | Status: DC | PRN

## 2015-10-18 MED ORDER — LIDOCAINE HCL (PF) 1 % IJ SOLN
INTRAMUSCULAR | Status: AC
  Filled 2015-10-18: qty 5

## 2015-10-18 MED ORDER — IOHEXOL 300 MG/ML  SOLN
100.0000 mL | Freq: Once | INTRAMUSCULAR | Status: AC | PRN
  Administered 2015-10-18: 100 mL via INTRAVENOUS

## 2015-10-18 MED ORDER — CEFTRIAXONE SODIUM 250 MG IJ SOLR
250.0000 mg | Freq: Once | INTRAMUSCULAR | Status: AC
  Administered 2015-10-18: 250 mg via INTRAMUSCULAR
  Filled 2015-10-18: qty 250

## 2015-10-18 NOTE — Discharge Instructions (Signed)
Follow up with dr. Ferguson in one week. °

## 2015-10-18 NOTE — ED Notes (Signed)
Pt reports mid, lower abdominal pain and nausea x 3 days. Pt reports pain radiates to lower back. Denies urinary symptoms. Denies fever/chills.

## 2015-10-18 NOTE — ED Provider Notes (Signed)
CSN: [id number]     Arrival date & time 10/18/15  1030 History  By signing my name below, I, Terressa Koyanagi, attest that this documentation has been prepared under the direction and in the presence of Milton Ferguson, MD. Electronically Signed: Terressa Koyanagi, ED Scribe. 10/18/2015. 12:04 PM.  Chief Complaint  Patient presents with  . Abdominal Pain   Patient is a 25 y.o. female presenting with abdominal pain and flank pain. The history is provided by the patient. No language interpreter was used.  Abdominal Pain Pain location:  Suprapubic Pain quality: dull   Pain severity:  Moderate Onset quality:  Sudden Duration:  3 days Timing:  Intermittent Progression:  Unchanged Chronicity:  New Associated symptoms: nausea   Associated symptoms: no chest pain, no chills, no cough, no diarrhea, no fatigue, no fever, no hematuria and no vomiting   Nausea:    Severity:  Moderate   Onset quality:  Sudden   Duration:  3 days   Timing:  Intermittent Flank Pain This is a new problem. The current episode started more than 1 week ago. The problem has not changed since onset.Associated symptoms include abdominal pain. Pertinent negatives include no chest pain and no headaches.   PCP: No PCP Per Patient HPI Comments: DEVLYNN PEARL is a 25 y.o. female, with PMHx noted below, who presents to the Emergency Department complaining of intermittent, 5/10, acute, sore mid/lower abd pain with associated nausea onset 3 days ago. Pt also notes bilateral flank pain onset couple of weeks ago. Pt reports her LMP was earlier this month. Pt denies urinary Sx, fever, chills, vomiting.   History reviewed. No pertinent past medical history. Past Surgical History  Procedure Laterality Date  . Back surgery    . Hernia repair     No family history on file. Social History  Substance Use Topics  . Smoking status: Current Every Day Smoker -- 1.00 packs/day    Types: Cigarettes  . Smokeless tobacco: None     Comment: never  done snuff or chewing tobacco.  . Alcohol Use: No     Comment: occasionally   OB History    Gravida Para Term Preterm AB TAB SAB Ectopic Multiple Living   1    1  1         Review of Systems  Constitutional: Negative for fever, chills, appetite change and fatigue.  HENT: Negative for congestion, ear discharge and sinus pressure.   Eyes: Negative for discharge.  Respiratory: Negative for cough.   Cardiovascular: Negative for chest pain.  Gastrointestinal: Positive for nausea and abdominal pain. Negative for vomiting and diarrhea.  Genitourinary: Positive for flank pain. Negative for frequency and hematuria.  Musculoskeletal: Negative for back pain.  Skin: Negative for rash.  Neurological: Negative for seizures and headaches.  Psychiatric/Behavioral: Negative for hallucinations.   Allergies  Review of patient's allergies indicates no known allergies.  Home Medications   Prior to Admission medications   Medication Sig Start Date End Date Taking? Authorizing Provider  cyclobenzaprine (FLEXERIL) 10 MG tablet Take 1 tablet (10 mg total) by mouth 3 (three) times daily as needed for muscle spasms. Patient not taking: Reported on 09/01/2015 05/14/15   Jola Schmidt, MD  ibuprofen (ADVIL,MOTRIN) 600 MG tablet Take 1 tablet (600 mg total) by mouth every 8 (eight) hours as needed. Patient not taking: Reported on 09/01/2015 05/14/15   Jola Schmidt, MD  pantoprazole (PROTONIX) 20 MG tablet Take 1 tablet (20 mg total) by mouth 2 (two) times daily.  Patient not taking: Reported on 10/18/2015 09/01/15   Virgel Manifold, MD   Triage vitals: BP 108/64 mmHg  Pulse 83  Temp(Src) 98.2 F (36.8 C) (Oral)  Resp 16  Wt 218 lb (98.884 kg)  SpO2 100%  LMP 09/24/2015 Physical Exam  Constitutional: She is oriented to person, place, and time. She appears well-developed.  HENT:  Head: Normocephalic.  Eyes: Conjunctivae and EOM are normal. No scleral icterus.  Neck: Neck supple. No thyromegaly present.   Cardiovascular: Normal rate and regular rhythm.  Exam reveals no gallop and no friction rub.   No murmur heard. Pulmonary/Chest: No stridor. She has no wheezes. She has no rales. She exhibits no tenderness.  Abdominal: She exhibits no distension. There is tenderness in the suprapubic area. There is no rebound.  Bilateral flank tenderness  Genitourinary:  Pelvic exam. Patient had a tender cervix and uterus but no discharge no masses felt ovaries nontender  Musculoskeletal: Normal range of motion. She exhibits no edema.  Lymphadenopathy:    She has no cervical adenopathy.  Neurological: She is oriented to person, place, and time. She exhibits normal muscle tone. Coordination normal.  Skin: No rash noted. No erythema.  Psychiatric: She has a normal mood and affect. Her behavior is normal.    ED Course  Procedures (including critical care time) DIAGNOSTIC STUDIES: Oxygen Saturation is 100% on ra, nl by my interpretation.    COORDINATION OF CARE: 12:00 PM: Discussed treatment plan which includes labs and imaging with pt at bedside; patient verbalizes understanding and agrees with treatment plan.  Labs Review Labs Reviewed  COMPREHENSIVE METABOLIC PANEL - Abnormal; Notable for the following:    Glucose, Bld 103 (*)    Total Protein 5.9 (*)    All other components within normal limits  CBC - Abnormal; Notable for the following:    WBC 10.8 (*)    All other components within normal limits  LIPASE, BLOOD  URINALYSIS, ROUTINE W REFLEX MICROSCOPIC (NOT AT [facility])  PREGNANCY, URINE   Imaging Review No results found. I have personally reviewed and evaluated these images and lab results as part of my medical decision-making.   MDM   Final diagnoses:  None   Labs and CT unremarkable. Patient with tenderness over her uterus. Possible uterine fibroid. Will cover patient for checks each admitted diseases. She will follow-up with Dr. Glo Herring. Patient did not want an ultrasound at this  time  The chart was scribed for me under my direct supervision.  I personally performed the history, physical, and medical decision making and all procedures in the evaluation of this patient.Milton Ferguson, MD 10/18/15 [phone]

## 2015-10-19 LAB — GC/CHLAMYDIA PROBE AMP (~~LOC~~) NOT AT ARMC
Chlamydia: NEGATIVE
Neisseria Gonorrhea: NEGATIVE

## 2016-01-12 ENCOUNTER — Encounter (HOSPITAL_COMMUNITY): Payer: Self-pay | Admitting: Emergency Medicine

## 2016-01-12 ENCOUNTER — Emergency Department (HOSPITAL_COMMUNITY)
Admission: EM | Admit: 2016-01-12 | Discharge: 2016-01-12 | Disposition: A | Discharge status: Home / Self Care | Payer: Medicaid Other | Attending: Emergency Medicine | Admitting: Emergency Medicine

## 2016-01-12 ENCOUNTER — Emergency Department (HOSPITAL_COMMUNITY): Payer: Medicaid Other

## 2016-01-12 DIAGNOSIS — F17210 Nicotine dependence, cigarettes, uncomplicated: Secondary | ICD-10-CM | POA: Insufficient documentation

## 2016-01-12 DIAGNOSIS — M542 Cervicalgia: Secondary | ICD-10-CM | POA: Insufficient documentation

## 2016-01-12 DIAGNOSIS — Z79899 Other long term (current) drug therapy: Secondary | ICD-10-CM | POA: Insufficient documentation

## 2016-01-12 DIAGNOSIS — M546 Pain in thoracic spine: Secondary | ICD-10-CM | POA: Insufficient documentation

## 2016-01-12 HISTORY — DX: Sciatica, unspecified side: M54.30

## 2016-01-12 HISTORY — DX: Other chronic pain: G89.29

## 2016-01-12 HISTORY — DX: Dorsalgia, unspecified: M54.9

## 2016-01-12 MED ORDER — METHOCARBAMOL 500 MG PO TABS: 1000.0000 mg | ORAL_TABLET | Freq: Four times a day (QID) | ORAL | Status: DC | PRN

## 2016-01-12 MED ORDER — NAPROXEN 250 MG PO TABS: 250.0000 mg | ORAL_TABLET | Freq: Two times a day (BID) | ORAL | Status: DC | PRN

## 2016-01-12 NOTE — Discharge Instructions (Signed)
Take the prescriptions as directed.  Apply moist heat or ice to the area(s) of discomfort, for 15 minutes at a time, several times per day for the next few days.  Do not fall asleep on a heating or ice pack.  Call your regular medical doctor on Monday to schedule a follow up appointment next week.  Return to the Emergency Department immediately if worsening. ° °

## 2016-01-12 NOTE — ED Provider Notes (Signed)
CSN: [id number]     Arrival date & time 01/12/16  1703 History   First MD Initiated Contact with Patient 01/12/16 1822     Chief Complaint  Patient presents with  . Back Pain      HPI Pt was seen at 1830. Per pt, c/o gradual onset and persistence of constant right sided neck and upper back "pain" for the past 1 month. Describes the pain as "sharp" and "spasm." Pain worsens with palpation of the area and body position changes. Pt took tylenol approximately 1500 today without improvement of her pain. Denies injury, no rash, no fevers, no focal motor weakness, no tingling/numbness in extremities, no CP/SOB.    Past Medical History  Diagnosis Date  . Chronic back pain   . Sciatica    Past Surgical History  Procedure Laterality Date  . Back surgery    . Hernia repair     History reviewed. No pertinent family history. Social History  Substance Use Topics  . Smoking status: Current Every Day Smoker -- 1.00 packs/day    Types: Cigarettes  . Smokeless tobacco: None     Comment: never done snuff or chewing tobacco.  . Alcohol Use: No     Comment: occasionally   OB History    Gravida Para Term Preterm AB TAB SAB Ectopic Multiple Living   1    1  1         Review of Systems ROS: Statement: All systems negative except as marked or noted in the HPI; Constitutional: Negative for fever and chills. ; ; Eyes: Negative for eye pain, redness and discharge. ; ; ENMT: Negative for ear pain, hoarseness, nasal congestion, sinus pressure and sore throat. ; ; Cardiovascular: Negative for chest pain, palpitations, diaphoresis, dyspnea and peripheral edema. ; ; Respiratory: Negative for cough, wheezing and stridor. ; ; Gastrointestinal: Negative for nausea, vomiting, diarrhea, abdominal pain, blood in stool, hematemesis, jaundice and rectal bleeding. . ; ; Genitourinary: Negative for dysuria, flank pain and hematuria. ; ; Musculoskeletal: +upper back/neck pain. Negative for swelling and trauma.; ; Skin:  Negative for pruritus, rash, abrasions, blisters, bruising and skin lesion.; ; Neuro: Negative for headache, lightheadedness and neck stiffness. Negative for weakness, altered level of consciousness , altered mental status, extremity weakness, paresthesias, involuntary movement, seizure and syncope.      Allergies  Review of patient's allergies indicates no known allergies.  Home Medications   Prior to Admission medications   Medication Sig Start Date End Date Taking? Authorizing Provider  acetaminophen (TYLENOL) 500 MG tablet Take 500 mg by mouth every 6 (six) hours as needed for mild pain or moderate pain.   Yes Historical Provider, MD   BP 154/79 mmHg  Pulse 68  Temp(Src) 97.8 F (36.6 C) (Oral)  Resp 20  Ht 5\' 9"  (1.753 m)  Wt 205 lb (92.987 kg)  BMI 30.26 kg/m2  SpO2 100%  LMP 01/07/2016 Physical Exam  1835: Physical examination:  Nursing notes reviewed; Vital signs and O2 SAT reviewed;  Constitutional: Well developed, Well nourished, Well hydrated, In no acute distress; Head:  Normocephalic, atraumatic; Eyes: EOMI, PERRL, No scleral icterus; ENMT: Mouth and pharynx normal, Mucous membranes moist; Neck: Supple, Full range of motion, No lymphadenopathy; Cardiovascular: Regular rate and rhythm, No murmur, rub, or gallop; Respiratory: Breath sounds clear & equal bilaterally, No rales, rhonchi, wheezes.  Speaking full sentences with ease, Normal respiratory effort/excursion; Chest: Nontender, Movement normal; Abdomen: Soft, Nontender, Nondistended, Normal bowel sounds; Genitourinary: No CVA tenderness; Spine:  No midline CS, TS, LS tenderness. +TTP right hypertonic trapezius muscle and right upper thoracic paraspinal muscles. No rash.;; Extremities: Pulses normal, No tenderness, No edema, No calf edema or asymmetry.; Neuro: AA&Ox3, Major CN grossly intact.  Speech clear. Grips equal. No gross focal motor or sensory deficits in extremities. Climbs on and off stretcher easily by herself. Gait  steady.; Skin: Color normal, Warm, Dry.   ED Course  Procedures (including critical care time) Labs Review   Imaging Review  I have personally reviewed and evaluated these images and lab results as part of my medical decision-making.   EKG Interpretation None      MDM  MDM Reviewed: previous chart, nursing note and vitals Interpretation: x-ray      Dg Cervical Spine Complete 01/12/2016  CLINICAL DATA:  Right-sided neck pain for 1 month radiating to right arm. EXAM: CERVICAL SPINE - COMPLETE 4+ VIEW COMPARISON:  None. FINDINGS: There is no evidence of cervical spine fracture or prevertebral soft tissue swelling. Alignment is normal. No other significant bone abnormalities are identified. IMPRESSION: Negative cervical spine radiographs. Electronically Signed   By: Earle Gell M.D.   On: 01/12/2016 19:05   Dg Thoracic Spine 2 View 01/12/2016  CLINICAL DATA:  Chronic thoracic back pain and sciatica. No known injury. EXAM: THORACIC SPINE 2 VIEWS COMPARISON:  None. FINDINGS: There is no evidence of thoracic spine fracture. Alignment is normal. No other significant bone abnormalities are identified. IMPRESSION: Negative. Electronically Signed   By: Earle Gell M.D.   On: 01/12/2016 19:08    1915:  XR reassuring. Neuro exam intact. Tx symptomatically, f/u PMD. Dx and testing d/w pt.  Questions answered.  Verb understanding, agreeable to d/c home with outpt f/u.   Francine Graven, DO 01/15/16 0110

## 2016-01-12 NOTE — ED Notes (Signed)
Increasing pain ot right shoulder for one month.  Rates pain 8/10.  Tylenol at 1515.

## 2016-02-21 ENCOUNTER — Encounter (HOSPITAL_COMMUNITY): Payer: Self-pay | Admitting: *Deleted

## 2016-02-21 ENCOUNTER — Emergency Department (HOSPITAL_COMMUNITY): Payer: Self-pay

## 2016-02-21 ENCOUNTER — Emergency Department (HOSPITAL_COMMUNITY)
Admission: EM | Admit: 2016-02-21 | Discharge: 2016-02-22 | Disposition: A | Discharge status: Home / Self Care | Payer: Self-pay | Attending: Emergency Medicine | Admitting: Emergency Medicine

## 2016-02-21 DIAGNOSIS — M545 Low back pain, unspecified: Secondary | ICD-10-CM

## 2016-02-21 DIAGNOSIS — F17210 Nicotine dependence, cigarettes, uncomplicated: Secondary | ICD-10-CM | POA: Insufficient documentation

## 2016-02-21 DIAGNOSIS — R1030 Lower abdominal pain, unspecified: Secondary | ICD-10-CM

## 2016-02-21 DIAGNOSIS — N939 Abnormal uterine and vaginal bleeding, unspecified: Secondary | ICD-10-CM | POA: Insufficient documentation

## 2016-02-21 DIAGNOSIS — K5900 Constipation, unspecified: Secondary | ICD-10-CM | POA: Insufficient documentation

## 2016-02-21 LAB — URINALYSIS, ROUTINE W REFLEX MICROSCOPIC
BILIRUBIN URINE: NEGATIVE
GLUCOSE, UA: NEGATIVE mg/dL
KETONES UR: NEGATIVE mg/dL
Leukocytes, UA: NEGATIVE
Nitrite: NEGATIVE
PH: 6 (ref 5.0–8.0)
Protein, ur: NEGATIVE mg/dL
Specific Gravity, Urine: 1.01 (ref 1.005–1.030)

## 2016-02-21 LAB — CBC WITH DIFFERENTIAL/PLATELET
Basophils Absolute: 0 10*3/uL (ref 0.0–0.1)
Basophils Relative: 0 %
EOS ABS: 0.2 10*3/uL (ref 0.0–0.7)
Eosinophils Relative: 2 %
HCT: 40 % (ref 36.0–46.0)
HEMOGLOBIN: 13.7 g/dL (ref 12.0–15.0)
LYMPHS ABS: 4.2 10*3/uL — AB (ref 0.7–4.0)
Lymphocytes Relative: 42 %
MCH: 31.7 pg (ref 26.0–34.0)
MCHC: 34.3 g/dL (ref 30.0–36.0)
MCV: 92.6 fL (ref 78.0–100.0)
Monocytes Absolute: 0.5 10*3/uL (ref 0.1–1.0)
Monocytes Relative: 5 %
NEUTROS PCT: 51 %
Neutro Abs: 5.2 10*3/uL (ref 1.7–7.7)
Platelets: 231 10*3/uL (ref 150–400)
RBC: 4.32 MIL/uL (ref 3.87–5.11)
RDW: 12.4 % (ref 11.5–15.5)
WBC: 10.1 10*3/uL (ref 4.0–10.5)

## 2016-02-21 LAB — URINE MICROSCOPIC-ADD ON

## 2016-02-21 LAB — PREGNANCY, URINE: Preg Test, Ur: NEGATIVE

## 2016-02-21 MED ORDER — CYCLOBENZAPRINE HCL 10 MG PO TABS
10.0000 mg | ORAL_TABLET | Freq: Once | ORAL | Status: AC
  Administered 2016-02-22: 10 mg via ORAL
  Filled 2016-02-21: qty 1

## 2016-02-21 MED ORDER — TRAMADOL HCL 50 MG PO TABS
50.0000 mg | ORAL_TABLET | Freq: Once | ORAL | Status: AC
  Administered 2016-02-22: 50 mg via ORAL
  Filled 2016-02-21: qty 1

## 2016-02-21 NOTE — ED Notes (Signed)
Pt c/o back pain, irregular periods, night sweats, constipation for a month,  took home pregnancy test that was negative,

## 2016-02-22 LAB — COMPREHENSIVE METABOLIC PANEL
ALT: 14 U/L (ref 14–54)
ANION GAP: 6 (ref 5–15)
AST: 18 U/L (ref 15–41)
Albumin: 4.1 g/dL (ref 3.5–5.0)
Alkaline Phosphatase: 57 U/L (ref 38–126)
BILIRUBIN TOTAL: 0.3 mg/dL (ref 0.3–1.2)
BUN: 8 mg/dL (ref 6–20)
CO2: 26 mmol/L (ref 22–32)
Calcium: 9.3 mg/dL (ref 8.9–10.3)
Chloride: 106 mmol/L (ref 101–111)
Creatinine, Ser: 0.8 mg/dL (ref 0.44–1.00)
Glucose, Bld: 91 mg/dL (ref 65–99)
POTASSIUM: 3.5 mmol/L (ref 3.5–5.1)
Sodium: 138 mmol/L (ref 135–145)
TOTAL PROTEIN: 7.2 g/dL (ref 6.5–8.1)

## 2016-02-22 LAB — LIPASE, BLOOD: LIPASE: 36 U/L (ref 11–51)

## 2016-02-22 MED ORDER — TRAMADOL HCL 50 MG PO TABS: 50.0000 mg | ORAL_TABLET | Freq: Two times a day (BID) | ORAL | Status: DC | PRN

## 2016-02-22 MED ORDER — POLYETHYLENE GLYCOL 3350 17 GM/SCOOP PO POWD: 17.0000 g | Freq: Every day | ORAL | Status: DC

## 2016-02-22 MED ORDER — CYCLOBENZAPRINE HCL 10 MG PO TABS: 10.0000 mg | ORAL_TABLET | Freq: Two times a day (BID) | ORAL | Status: DC | PRN

## 2016-02-22 MED ORDER — IBUPROFEN 800 MG PO TABS: 800.0000 mg | ORAL_TABLET | Freq: Three times a day (TID) | ORAL | Status: DC

## 2016-02-22 NOTE — Discharge Instructions (Signed)
Back Pain, Adult °Back pain is very common in adults. The cause of back pain is rarely dangerous and the pain often gets better over time. The cause of your back pain may not be known. Some common causes of back pain include: °· Strain of the muscles or ligaments supporting the spine. °· Wear and tear (degeneration) of the spinal disks. °· Arthritis. °· Direct injury to the back. °For many people, back pain may return. Since back pain is rarely dangerous, most people can learn to manage this condition on their own. °HOME CARE INSTRUCTIONS °Watch your back pain for any changes. The following actions may help to lessen any discomfort you are feeling: °· Remain active. It is stressful on your back to sit or stand in one place for long periods of time. Do not sit, drive, or stand in one place for more than 30 minutes at a time. Take short walks on even surfaces as soon as you are able. Try to increase the length of time you walk each day. °· Exercise regularly as directed by your health care provider. Exercise helps your back heal faster. It also helps avoid future injury by keeping your muscles strong and flexible. °· Do not stay in bed. Resting more than 1-2 days can delay your recovery. °· Pay attention to your body when you bend and lift. The most comfortable positions are those that put less stress on your recovering back. Always use proper lifting techniques, including: °· Bending your knees. °· Keeping the load close to your body. °· Avoiding twisting. °· Find a comfortable position to sleep. Use a firm mattress and lie on your side with your knees slightly bent. If you lie on your back, put a pillow under your knees. °· Avoid feeling anxious or stressed. Stress increases muscle tension and can worsen back pain. It is important to recognize when you are anxious or stressed and learn ways to manage it, such as with exercise. °· Take medicines only as directed by your health care provider. Over-the-counter  medicines to reduce pain and inflammation are often the most helpful. Your health care provider may prescribe muscle relaxant drugs. These medicines help dull your pain so you can more quickly return to your normal activities and healthy exercise. °· Apply ice to the injured area: °· Put ice in a plastic bag. °· Place a towel between your skin and the bag. °· Leave the ice on for 20 minutes, 2-3 times a day for the first 2-3 days. After that, ice and heat may be alternated to reduce pain and spasms. °· Maintain a healthy weight. Excess weight puts extra stress on your back and makes it difficult to maintain good posture. °SEEK MEDICAL CARE IF: °· You have pain that is not relieved with rest or medicine. °· You have increasing pain going down into the legs or buttocks. °· You have pain that does not improve in one week. °· You have night pain. °· You lose weight. °· You have a fever or chills. °SEEK IMMEDIATE MEDICAL CARE IF:  °· You develop new bowel or bladder control problems. °· You have unusual weakness or numbness in your arms or legs. °· You develop nausea or vomiting. °· You develop abdominal pain. °· You feel faint. °  °This information is not intended to replace advice given to you by your health care provider. Make sure you discuss any questions you have with your health care provider. °  °Document Released: 09/09/2005 Document Revised: 09/30/2014 Document Reviewed: 01/11/2014 °Elsevier Interactive Patient Education ©2016 Elsevier   Inc[employer]  Abdominal Pain, Adult Many things can cause abdominal pain. Usually, abdominal pain is not caused by a disease and will improve without treatment. It can often be observed and treated at home. Your health care provider will do a physical exam and possibly order blood tests and X-rays to help determine the seriousness of your pain. However, in many cases, more time must pass before a clear cause of the pain can be found. Before that point, your health care provider may  not know if you need more testing or further treatment. HOME CARE INSTRUCTIONS Monitor your abdominal pain for any changes. The following actions may help to alleviate any discomfort you are experiencing:  Only take over-the-counter or prescription medicines as directed by your health care provider.  Do not take laxatives unless directed to do so by your health care provider.  Try a clear liquid diet (broth, tea, or water) as directed by your health care provider. Slowly move to a bland diet as tolerated. SEEK MEDICAL CARE IF:  You have unexplained abdominal pain.  You have abdominal pain associated with nausea or diarrhea.  You have pain when you urinate or have a bowel movement.  You experience abdominal pain that wakes you in the night.  You have abdominal pain that is worsened or improved by eating food.  You have abdominal pain that is worsened with eating fatty foods.  You have a fever. SEEK IMMEDIATE MEDICAL CARE IF:  Your pain does not go away within 2 hours.  You keep throwing up (vomiting).  Your pain is felt only in portions of the abdomen, such as the right side or the left lower portion of the abdomen.  You pass bloody or black tarry stools. MAKE SURE YOU:  Understand these instructions.  Will watch your condition.  Will get help right away if you are not doing well or get worse.   This information is not intended to replace advice given to you by your health care provider. Make sure you discuss any questions you have with your health care provider.   Document Released: 06/19/2005 Document Revised: 05/31/2015 Document Reviewed: 05/19/2013 Elsevier Interactive Patient Education 2016 Elsevier Inc.  Abnormal Uterine Bleeding Abnormal uterine bleeding can affect women at various stages in life, including teenagers, women in their reproductive years, pregnant women, and women who have reached menopause. Several kinds of uterine bleeding are considered abnormal,  including:  Bleeding or spotting between periods.   Bleeding after sexual intercourse.   Bleeding that is heavier or more than normal.   Periods that last longer than usual.  Bleeding after menopause.  Many cases of abnormal uterine bleeding are minor and simple to treat, while others are more serious. Any type of abnormal bleeding should be evaluated by your health care provider. Treatment will depend on the cause of the bleeding. HOME CARE INSTRUCTIONS Monitor your condition for any changes. The following actions may help to alleviate any discomfort you are experiencing:  Avoid the use of tampons and douches as directed by your health care provider.  Change your pads frequently. You should get regular pelvic exams and Pap tests. Keep all follow-up appointments for diagnostic tests as directed by your health care provider.  SEEK MEDICAL CARE IF:   Your bleeding lasts more than 1 week.   You feel dizzy at times.  SEEK IMMEDIATE MEDICAL CARE IF:   You pass out.   You are changing pads every 15 to 30 minutes.   You have abdominal pain.  You have a fever.   You become sweaty or weak.   You are passing large blood clots from the vagina.   You start to feel nauseous and vomit. MAKE SURE YOU:   Understand these instructions.  Will watch your condition.  Will get help right away if you are not doing well or get worse.   This information is not intended to replace advice given to you by your health care provider. Make sure you discuss any questions you have with your health care provider.   Document Released: 09/09/2005 Document Revised: 09/14/2013 Document Reviewed: 04/08/2013 Elsevier Interactive Patient Education [employer].

## 2016-02-22 NOTE — ED Provider Notes (Signed)
CSN: [id number]     Arrival date & time 02/21/16  2030 History   First MD Initiated Contact with Patient 02/21/16 2226     Chief Complaint  Patient presents with  . Back Pain     (Consider location/radiation/quality/duration/timing/severity/associated sxs/prior Treatment) HPI patient is a 25 year old female, history of chronic back pain and sciatica, who presents to the emergency room with complaints of low back pain, low pubic pain, abnormal menses and loss of appetite and constipation for 1 month.  The patient describes her pain as lower abdominal cramping and cramping in her low back, rated at 10, worse with any movement including sitting or lying down while walking.  Pain is not worse with palpation, deep inspiration or eating.  She suffers from chronic constipation, she states that she has been constipated but did have bowel movement 2 days ago with a lot of straining.  She reports night sweats for 2 weeks, lack of appetite with nausea and vomiting 1 week ago. She states that her urine is darker than normal she denies hematuria, dysuria  Past Medical History  Diagnosis Date  . Chronic back pain   . Sciatica    Past Surgical History  Procedure Laterality Date  . Back surgery    . Hernia repair     No family history on file. Social History  Substance Use Topics  . Smoking status: Current Every Day Smoker -- 1.00 packs/day    Types: Cigarettes  . Smokeless tobacco: None     Comment: never done snuff or chewing tobacco.  . Alcohol Use: No     Comment: occasionally   OB History    Gravida Para Term Preterm AB TAB SAB Ectopic Multiple Living   1    1  1         Review of Systems  All other systems reviewed and are negative.     Allergies  Review of patient's allergies indicates no known allergies.  Home Medications   Prior to Admission medications   Medication Sig Start Date End Date Taking? Authorizing Provider  acetaminophen (TYLENOL) 500 MG tablet Take 500 mg by  mouth every 6 (six) hours as needed for mild pain or moderate pain.    Historical Provider, MD  cyclobenzaprine (FLEXERIL) 10 MG tablet Take 1 tablet (10 mg total) by mouth 2 (two) times daily as needed for muscle spasms. 02/22/16   Delsa Grana, PA-C  ibuprofen (ADVIL,MOTRIN) 800 MG tablet Take 1 tablet (800 mg total) by mouth 3 (three) times daily. 02/22/16   Delsa Grana, PA-C  methocarbamol (ROBAXIN) 500 MG tablet Take 2 tablets (1,000 mg total) by mouth 4 (four) times daily as needed for muscle spasms (muscle spasm/pain). Patient not taking: Reported on 02/21/2016 01/12/16   Francine Graven, DO  polyethylene glycol powder (GLYCOLAX/MIRALAX) powder Take 17 g by mouth daily. 02/22/16   Delsa Grana, PA-C  traMADol (ULTRAM) 50 MG tablet Take 1 tablet (50 mg total) by mouth every 12 (twelve) hours as needed for severe pain. 02/22/16   Delsa Grana, PA-C   BP 124/76 mmHg  Pulse 85  Temp(Src) 98.2 F (36.8 C) (Oral)  Resp 18  Wt 97.523 kg  SpO2 98%  LMP 02/19/2016 Physical Exam  Constitutional: She is oriented to person, place, and time. She appears well-developed and well-nourished. No distress.  HENT:  Head: Normocephalic and atraumatic.  Nose: Nose normal.  Mouth/Throat: Oropharynx is clear and moist. No oropharyngeal exudate.  Eyes: Conjunctivae and EOM are normal. Pupils are equal,  round, and reactive to light. Right eye exhibits no discharge. Left eye exhibits no discharge. No scleral icterus.  Neck: Normal range of motion. No JVD present. No tracheal deviation present. No thyromegaly present.  Cardiovascular: Normal rate, regular rhythm, normal heart sounds and intact distal pulses.  Exam reveals no gallop and no friction rub.   No murmur heard. Pulmonary/Chest: Effort normal and breath sounds normal. No respiratory distress. She has no wheezes. She has no rales. She exhibits no tenderness.  Abdominal: Soft. Bowel sounds are normal. She exhibits no distension and no mass. There is no tenderness.  There is no rebound and no guarding.  Musculoskeletal: Normal range of motion. She exhibits no edema or tenderness.  Lymphadenopathy:    She has no cervical adenopathy.  Neurological: She is alert and oriented to person, place, and time. She has normal reflexes. No cranial nerve deficit. She exhibits normal muscle tone. Coordination normal.  Skin: Skin is warm and dry. No rash noted. She is not diaphoretic. No erythema. No pallor.  Psychiatric: She has a normal mood and affect. Her behavior is normal. Judgment and thought content normal.  Nursing note and vitals reviewed.   ED Course  Procedures (including critical care time) Labs Review Labs Reviewed  URINALYSIS, ROUTINE W REFLEX MICROSCOPIC (NOT AT [facility]) - Abnormal; Notable for the following:    Hgb urine dipstick LARGE (*)    All other components within normal limits  URINE MICROSCOPIC-ADD ON - Abnormal; Notable for the following:    Squamous Epithelial / LPF 0-5 (*)    Bacteria, UA FEW (*)    All other components within normal limits  CBC WITH DIFFERENTIAL/PLATELET - Abnormal; Notable for the following:    Lymphs Abs 4.2 (*)    All other components within normal limits  PREGNANCY, URINE  COMPREHENSIVE METABOLIC PANEL  LIPASE, BLOOD  GC/CHLAMYDIA PROBE AMP ([place]) NOT AT [facility]    Imaging Review Dg Abd 2 Views  02/21/2016  CLINICAL DATA:  Pain. EXAM: ABDOMEN - 2 VIEW COMPARISON:  None. FINDINGS: The bowel gas pattern is normal. There is no evidence of free air. No radio-opaque calculi or other significant radiographic abnormality is seen. IMPRESSION: Negative. Electronically Signed   By: Dorise Bullion III M.D   On: 02/21/2016 23:27   I have personally reviewed and evaluated these images and lab results as part of my medical decision-making.   EKG Interpretation None      MDM    Patient lab low abdominal pain and low back pain with several other vague complaints.  Pt is well-appearing, hemodynamically stable and  afebrile.  She complained of 10/10 pain. On exam abdomen soft, nontender, nondistended, normal bowel sounds.  She is very concerned with abnormal periods with increased frequency over the past 2-3 months. She has a negative pregnancy test here.  She does not have an OB/GYN, refuses pelvic and STD testing, stating that she had negative testing one month ago, same partner.  She is very concerned with possible miscarriage or missed pregnancy, but states that she's had more frequent than normal periods, and negative home pregnancy tests, and a negative pregnancy test here.  She states she is very concerned because she had multiple negative test last year and later was found to have a spontaneous miscarriage.  Basic lab work and urinalysis was obtained, urinalysis negative for UTI, basic labs are unremarkable, plain films of the abdomen are negative.  Patient denied any possibility of STDs, stating she was tested one month  ago and had negative results, she has been the same partner for 1 year, she denies vaginal symptoms including vaginal discharge, rash, sores. She refused pelvic exam and testing here in the ER.  Patient abdomen is without guarding and rebound tenderness, do not feel presentation and exam warrants CT scanning.    Patient was updated regarding all her results. She was encouraged to see an OB/GYN.  Return precautions were reviewed at length with the patient.  She was encouraged to use MiraLAX for her chronic constipation.  Abdominal pain and back pain may be related to constipation, do not suspect PID with denial of vaginal complaints and normal labs.  Again encouraged pt to find an OBGYN, given contact info for [facility], encouraged to seek local provider.   Pt discharged in good condition, VSS. Filed Vitals:   02/21/16 2035 02/21/16 2354  BP: 133/78 124/76  Pulse: 84 85  Temp: 98.6 F (37 C) 98.2 F (36.8 C)  Resp: 18 18     Final diagnoses:  Abnormal uterine bleeding (AUB)   Bilateral low back pain without sciatica  Lower abdominal pain  Constipation, unspecified constipation type     Delsa Grana, PA-C 02/24/16 0159  Ripley Fraise, MD 02/24/16 [phone]

## 2016-06-15 ENCOUNTER — Encounter: Payer: Self-pay | Admitting: *Deleted

## 2016-06-15 ENCOUNTER — Emergency Department
Admission: EM | Admit: 2016-06-15 | Discharge: 2016-06-15 | Disposition: A | Discharge status: Home / Self Care | Payer: Medicaid Other | Attending: Emergency Medicine | Admitting: Emergency Medicine

## 2016-06-15 DIAGNOSIS — R102 Pelvic and perineal pain: Secondary | ICD-10-CM | POA: Insufficient documentation

## 2016-06-15 DIAGNOSIS — N939 Abnormal uterine and vaginal bleeding, unspecified: Secondary | ICD-10-CM | POA: Insufficient documentation

## 2016-06-15 DIAGNOSIS — F17210 Nicotine dependence, cigarettes, uncomplicated: Secondary | ICD-10-CM | POA: Insufficient documentation

## 2016-06-15 DIAGNOSIS — R109 Unspecified abdominal pain: Secondary | ICD-10-CM

## 2016-06-15 DIAGNOSIS — N390 Urinary tract infection, site not specified: Secondary | ICD-10-CM

## 2016-06-15 DIAGNOSIS — Z79899 Other long term (current) drug therapy: Secondary | ICD-10-CM | POA: Insufficient documentation

## 2016-06-15 LAB — BASIC METABOLIC PANEL
Anion gap: 3 — ABNORMAL LOW (ref 5–15)
BUN: 8 mg/dL (ref 6–20)
CHLORIDE: 108 mmol/L (ref 101–111)
CO2: 26 mmol/L (ref 22–32)
CREATININE: 0.76 mg/dL (ref 0.44–1.00)
Calcium: 9.4 mg/dL (ref 8.9–10.3)
GFR calc non Af Amer: >60 mL/min (ref >60)
Glucose, Bld: 91 mg/dL (ref 65–99)
POTASSIUM: 3.8 mmol/L (ref 3.5–5.1)
Sodium: 137 mmol/L (ref 135–145)

## 2016-06-15 LAB — URINALYSIS COMPLETE WITH MICROSCOPIC (ARMC ONLY)
BILIRUBIN URINE: NEGATIVE
Glucose, UA: 50 mg/dL — AB
Ketones, ur: NEGATIVE mg/dL
Nitrite: NEGATIVE
PH: 6 (ref 5.0–8.0)
PROTEIN: 100 mg/dL — AB
Specific Gravity, Urine: 1.013 (ref 1.005–1.030)

## 2016-06-15 LAB — WET PREP, GENITAL
Clue Cells Wet Prep HPF POC: NONE SEEN
Sperm: NONE SEEN
TRICH WET PREP: NONE SEEN
WBC, Wet Prep HPF POC: NONE SEEN
YEAST WET PREP: NONE SEEN

## 2016-06-15 LAB — CBC
HEMATOCRIT: 41.6 % (ref 35.0–47.0)
HEMOGLOBIN: 14.7 g/dL (ref 12.0–16.0)
MCH: 32.4 pg (ref 26.0–34.0)
MCHC: 35.4 g/dL (ref 32.0–36.0)
MCV: 91.5 fL (ref 80.0–100.0)
Platelets: 232 10*3/uL (ref 150–440)
RBC: 4.54 MIL/uL (ref 3.80–5.20)
RDW: 13.1 % (ref 11.5–14.5)
WBC: 11.5 10*3/uL — ABNORMAL HIGH (ref 3.6–11.0)

## 2016-06-15 LAB — POCT PREGNANCY, URINE: Preg Test, Ur: NEGATIVE

## 2016-06-15 LAB — HCG, QUANTITATIVE, PREGNANCY: hCG, Beta Chain, Quant, S: 1 m[IU]/mL (ref <5)

## 2016-06-15 LAB — CHLAMYDIA/NGC RT PCR (ARMC ONLY)
Chlamydia Tr: NOT DETECTED
N gonorrhoeae: NOT DETECTED

## 2016-06-15 MED ORDER — KETOROLAC TROMETHAMINE 30 MG/ML IJ SOLN
30.0000 mg | Freq: Once | INTRAMUSCULAR | Status: AC
  Administered 2016-06-15: 30 mg via INTRAVENOUS
  Filled 2016-06-15: qty 1

## 2016-06-15 MED ORDER — NITROFURANTOIN MONOHYD MACRO 100 MG PO CAPS: 100.0000 mg | ORAL_CAPSULE | Freq: Two times a day (BID) | ORAL | 0 refills | Status: AC

## 2016-06-15 MED ORDER — NITROFURANTOIN MONOHYD MACRO 100 MG PO CAPS
100.0000 mg | ORAL_CAPSULE | Freq: Two times a day (BID) | ORAL | Status: DC
  Administered 2016-06-15: 100 mg via ORAL
  Filled 2016-06-15: qty 1

## 2016-06-15 MED ORDER — SODIUM CHLORIDE 0.9 % IV BOLUS (SEPSIS)
1000.0000 mL | Freq: Once | INTRAVENOUS | Status: AC
  Administered 2016-06-15: 1000 mL via INTRAVENOUS

## 2016-06-15 NOTE — ED Notes (Signed)
Patient with last mentral 05/06/16.  Patient complains of bilateral lower abdominal pain that radiates to back.  Patient abnormal bleeding that started 06/14/16.  Patient passing multple clots that are quarter and dime size.  Patient does not report clots with previous periods.

## 2016-06-15 NOTE — Discharge Instructions (Signed)

## 2016-06-15 NOTE — ED Provider Notes (Signed)
[facility] Emergency Department Provider Note  ____________________________________________  Time seen: Approximately 3:54 PM  I have reviewed the triage vital signs and the nursing notes.   HISTORY  Chief Complaint Vaginal Bleeding   HPI Debbie Santana is a 25 y.o. female no significant past medical history who presents for evaluation of vaginal bleeding and lower abdominal cramping. Patient reports her last menstrual period was 6 weeks ago. She is usually pretty regular and has periods every 4 weeks and the fact that this is 2 weeks late made patient concerned that she was pregnant. Yesterday she started having vaginal spotting and today she started passing blood clots. She also endorses mild dizziness especially when she stands up. She started having suprapubic abdominal pressure that she rates as 6 out of 10. She reports that she had a prior miscarriage in 2014. She does not take OCPs. She also endorses that she's been urinating more frequently due to the pressure that she is feeling in her lower abdomen but denies dysuria, hematuria. She hasn't tried anything at home for the pain. She denies nausea or vomiting, chest pain, fever, chills, constipation, diarrhea.   Past Medical History:  Diagnosis Date  . Chronic back pain   . Sciatica     Patient Active Problem List   Diagnosis Date Noted  . Right hand fracture 10/06/2013  . Fracture of metacarpal shaft of right hand, closed 10/06/2013  . Spontaneous abortion in first trimester 04/21/2013    Past Surgical History:  Procedure Laterality Date  . BACK SURGERY    . HERNIA REPAIR      Prior to Admission medications   Medication Sig Start Date End Date Taking? Authorizing Provider  acetaminophen (TYLENOL) 500 MG tablet Take 500 mg by mouth every 6 (six) hours as needed for mild pain or moderate pain.    Historical Provider, MD  cyclobenzaprine (FLEXERIL) 10 MG tablet Take 1 tablet (10 mg total) by  mouth 2 (two) times daily as needed for muscle spasms. 02/22/16   Delsa Grana, PA-C  ibuprofen (ADVIL,MOTRIN) 800 MG tablet Take 1 tablet (800 mg total) by mouth 3 (three) times daily. 02/22/16   Delsa Grana, PA-C  methocarbamol (ROBAXIN) 500 MG tablet Take 2 tablets (1,000 mg total) by mouth 4 (four) times daily as needed for muscle spasms (muscle spasm/pain). Patient not taking: Reported on 02/21/2016 01/12/16   Francine Graven, DO  nitrofurantoin, macrocrystal-monohydrate, (MACROBID) 100 MG capsule Take 1 capsule (100 mg total) by mouth 2 (two) times daily. 06/15/16 06/20/16  Rudene Re, MD  polyethylene glycol powder (GLYCOLAX/MIRALAX) powder Take 17 g by mouth daily. 02/22/16   Delsa Grana, PA-C  traMADol (ULTRAM) 50 MG tablet Take 1 tablet (50 mg total) by mouth every 12 (twelve) hours as needed for severe pain. 02/22/16   Delsa Grana, PA-C    Allergies Review of patient's allergies indicates no known allergies.  No family history on file.  Social History Social History  Substance Use Topics  . Smoking status: Current Every Day Smoker    Packs/day: 1.00    Types: Cigarettes  . Smokeless tobacco: Never Used     Comment: never done snuff or chewing tobacco.  . Alcohol use No     Comment: occasionally    Review of Systems  Constitutional: Negative for fever. Eyes: Negative for visual changes. ENT: Negative for sore throat. Cardiovascular: Negative for chest pain. Respiratory: Negative for shortness of breath. Gastrointestinal: + suprapubic abdominal pain. No vomiting or diarrhea. Genitourinary:  Negative for dysuria. + frequency, vaginal bleeding Musculoskeletal: Negative for back pain. Skin: Negative for rash. Neurological: Negative for headaches, weakness or numbness.  ____________________________________________   PHYSICAL EXAM:  VITAL SIGNS: ED Triage Vitals  Enc Vitals Group     BP 06/15/16 1228 (!) 147/88     Pulse Rate 06/15/16 1228 (!) 107     Resp 06/15/16 1228  20     Temp 06/15/16 1228 98.1 F (36.7 C)     Temp Source 06/15/16 1228 Oral     SpO2 06/15/16 1228 98 %     Weight 06/15/16 1229 200 lb (90.7 kg)     Height 06/15/16 1229 5\' 11"  (1.803 m)     Head Circumference --      Peak Flow --      Pain Score 06/15/16 1229 6     Pain Loc --      Pain Edu? --      Excl. in [place]? --     Constitutional: Alert and oriented. Well appearing and in no apparent distress. HEENT:      Head: Normocephalic and atraumatic.         Eyes: Conjunctivae are normal. Sclera is non-icteric. EOMI. PERRL      Mouth/Throat: Mucous membranes are moist.       Neck: Supple with no signs of meningismus. Cardiovascular: Tachycardic with regular rhythm. No murmurs, gallops, or rubs. 2+ symmetrical distal pulses are present in all extremities. No JVD. Respiratory: Normal respiratory effort. Lungs are clear to auscultation bilaterally. No wheezes, crackles, or rhonchi.  Gastrointestinal: Soft, mild suprapubic tenderness to palpation, non distended with positive bowel sounds. No rebound or guarding. Genitourinary: No CVA tenderness. Pelvic exam: Normal external genitalia, no rashes or lesions. Normal cervical mucus. Os closed. Small amount of blood in the vaginal vault coming from os, no trauma or lacerations. No cervical motion tenderness.  No uterine or adnexal tenderness.   Musculoskeletal: Nontender with normal range of motion in all extremities. No edema, cyanosis, or erythema of extremities. Neurologic: Normal speech and language. Face is symmetric. Moving all extremities. No gross focal neurologic deficits are appreciated. Skin: Skin is warm, dry and intact. No rash noted. Psychiatric: Mood and affect are normal. Speech and behavior are normal.  ____________________________________________   LABS (all labs ordered are listed, but only abnormal results are displayed)  Labs Reviewed  CBC - Abnormal; Notable for the following:       Result Value   WBC 11.5 (*)     All other components within normal limits  BASIC METABOLIC PANEL - Abnormal; Notable for the following:    Anion gap 3 (*)    All other components within normal limits  URINALYSIS COMPLETEWITH MICROSCOPIC ([facility] ONLY) - Abnormal; Notable for the following:    Color, Urine RED (*)    APPearance HAZY (*)    Glucose, UA 50 (*)    Hgb urine dipstick 3+ (*)    Protein, ur 100 (*)    Leukocytes, UA TRACE (*)    Bacteria, UA RARE (*)    Squamous Epithelial / LPF 6-30 (*)    All other components within normal limits  WET PREP, GENITAL  CHLAMYDIA/NGC RT PCR ([facility] ONLY)  HCG, QUANTITATIVE, PREGNANCY  POC URINE PREG, ED  POCT PREGNANCY, URINE   ____________________________________________  EKG  none ____________________________________________  RADIOLOGY  none  ____________________________________________   PROCEDURES  Procedure(s) performed: None Procedures Critical Care performed:  None ____________________________________________   INITIAL IMPRESSION / ASSESSMENT AND  PLAN / ED COURSE   25 y.o. female no significant past medical history who presents for evaluation of vaginal bleeding and lower abdominal cramping. Urine pregnancy negative, we'll send hCG to confirm this patient has abdominal pain and I need to rule out ectopic if the hCG is positive. This is probably just a menstrual period. Blood count is within normal limits. Patient's tachycardia resolved with IV fluids. Abdominal exam showing mild suprapubic tenderness with no right lower quadrant tenderness to palpation, no adnexal tenderness to palpation on pelvic exam. We'll also check a UA to rule out urinary tract infection as patient is complaining of urinary frequency. She was given Toradol for the pain.  Clinical Course  Comment By Time  UA concerning for UTI. HCG negative. Start patient on [place] and close follow-up with primary care doctor. Rudene Re, MD 09/23 1653    Pertinent labs & imaging  results that were available during my care of the patient were reviewed by me and considered in my medical decision making (see chart for details).    ____________________________________________   FINAL CLINICAL IMPRESSION(S) / ED DIAGNOSES  Final diagnoses:  Vaginal bleeding  UTI (lower urinary tract infection)  Abdominal pain, unspecified abdominal location      NEW MEDICATIONS STARTED DURING THIS VISIT:  New Prescriptions   NITROFURANTOIN, MACROCRYSTAL-MONOHYDRATE, (MACROBID) 100 MG CAPSULE    Take 1 capsule (100 mg total) by mouth 2 (two) times daily.     Note:  This document was prepared using Dragon voice recognition software and may include unintentional dictation errors.    Rudene Re, MD 06/15/16 1655

## 2016-06-15 NOTE — ED Triage Notes (Signed)
Pt reports having pelvic pain with vaginal spotting, pt reports having a menstrual cycle last month, pt reports usually having regular cycles, pt complains of back pain

## 2016-09-08 DIAGNOSIS — Z79899 Other long term (current) drug therapy: Secondary | ICD-10-CM | POA: Insufficient documentation

## 2016-09-08 DIAGNOSIS — F17210 Nicotine dependence, cigarettes, uncomplicated: Secondary | ICD-10-CM | POA: Insufficient documentation

## 2016-09-08 DIAGNOSIS — F1290 Cannabis use, unspecified, uncomplicated: Secondary | ICD-10-CM | POA: Insufficient documentation

## 2016-09-08 DIAGNOSIS — N200 Calculus of kidney: Secondary | ICD-10-CM | POA: Insufficient documentation

## 2016-09-09 ENCOUNTER — Emergency Department: Payer: Medicaid Other

## 2016-09-09 ENCOUNTER — Emergency Department
Admission: EM | Admit: 2016-09-09 | Discharge: 2016-09-09 | Disposition: A | Discharge status: Home / Self Care | Payer: Medicaid Other | Attending: Emergency Medicine | Admitting: Emergency Medicine

## 2016-09-09 DIAGNOSIS — R1010 Upper abdominal pain, unspecified: Secondary | ICD-10-CM

## 2016-09-09 DIAGNOSIS — N200 Calculus of kidney: Secondary | ICD-10-CM

## 2016-09-09 DIAGNOSIS — R109 Unspecified abdominal pain: Secondary | ICD-10-CM

## 2016-09-09 LAB — COMPREHENSIVE METABOLIC PANEL
ALBUMIN: 4 g/dL (ref 3.5–5.0)
ALK PHOS: 66 U/L (ref 38–126)
ALT: 15 U/L (ref 14–54)
ANION GAP: 8 (ref 5–15)
AST: 20 U/L (ref 15–41)
BUN: 12 mg/dL (ref 6–20)
CALCIUM: 9.3 mg/dL (ref 8.9–10.3)
CHLORIDE: 103 mmol/L (ref 101–111)
CO2: 22 mmol/L (ref 22–32)
Creatinine, Ser: 1.03 mg/dL — ABNORMAL HIGH (ref 0.44–1.00)
GFR calc Af Amer: >60 mL/min (ref >60)
GFR calc non Af Amer: >60 mL/min (ref >60)
GLUCOSE: 129 mg/dL — AB (ref 65–99)
POTASSIUM: 3.3 mmol/L — AB (ref 3.5–5.1)
SODIUM: 133 mmol/L — AB (ref 135–145)
Total Bilirubin: 0.2 mg/dL — ABNORMAL LOW (ref 0.3–1.2)
Total Protein: 7 g/dL (ref 6.5–8.1)

## 2016-09-09 LAB — CBC
HEMATOCRIT: 39.1 % (ref 35.0–47.0)
HEMOGLOBIN: 13.7 g/dL (ref 12.0–16.0)
MCH: 32 pg (ref 26.0–34.0)
MCHC: 35.1 g/dL (ref 32.0–36.0)
MCV: 91.2 fL (ref 80.0–100.0)
Platelets: 279 10*3/uL (ref 150–440)
RBC: 4.29 MIL/uL (ref 3.80–5.20)
RDW: 12.6 % (ref 11.5–14.5)
WBC: 19.8 10*3/uL — ABNORMAL HIGH (ref 3.6–11.0)

## 2016-09-09 LAB — URINALYSIS, COMPLETE (UACMP) WITH MICROSCOPIC
Bilirubin Urine: NEGATIVE
Glucose, UA: NEGATIVE mg/dL
KETONES UR: NEGATIVE mg/dL
Nitrite: NEGATIVE
PROTEIN: 30 mg/dL — AB
Specific Gravity, Urine: 1.014 (ref 1.005–1.030)
pH: 5 (ref 5.0–8.0)

## 2016-09-09 LAB — POCT PREGNANCY, URINE: Preg Test, Ur: NEGATIVE

## 2016-09-09 LAB — LIPASE, BLOOD: LIPASE: 19 U/L (ref 11–51)

## 2016-09-09 MED ORDER — ONDANSETRON 4 MG PO TBDP: 4.0000 mg | ORAL_TABLET | Freq: Three times a day (TID) | ORAL | 0 refills | Status: DC | PRN

## 2016-09-09 MED ORDER — ONDANSETRON HCL 4 MG/2ML IJ SOLN
INTRAMUSCULAR | Status: AC
  Administered 2016-09-09: 4 mg via INTRAVENOUS
  Filled 2016-09-09: qty 2

## 2016-09-09 MED ORDER — ONDANSETRON HCL 4 MG/2ML IJ SOLN
4.0000 mg | Freq: Once | INTRAMUSCULAR | Status: AC
  Administered 2016-09-09 (×2): 4 mg via INTRAVENOUS

## 2016-09-09 MED ORDER — PROMETHAZINE HCL 25 MG/ML IJ SOLN
25.0000 mg | Freq: Once | INTRAMUSCULAR | Status: AC
  Administered 2016-09-09: 25 mg via INTRAVENOUS
  Filled 2016-09-09: qty 1

## 2016-09-09 MED ORDER — GI COCKTAIL ~~LOC~~
ORAL | Status: AC
  Filled 2016-09-09: qty 30

## 2016-09-09 MED ORDER — HYDROMORPHONE HCL 1 MG/ML IJ SOLN
1.0000 mg | Freq: Once | INTRAMUSCULAR | Status: AC
  Administered 2016-09-09: 1 mg via INTRAVENOUS

## 2016-09-09 MED ORDER — GI COCKTAIL ~~LOC~~
30.0000 mL | Freq: Once | ORAL | Status: AC
  Administered 2016-09-09: 30 mL via ORAL

## 2016-09-09 MED ORDER — FAMOTIDINE IN NACL 20-0.9 MG/50ML-% IV SOLN
20.0000 mg | Freq: Once | INTRAVENOUS | Status: AC
  Administered 2016-09-09: 20 mg via INTRAVENOUS

## 2016-09-09 MED ORDER — ONDANSETRON 4 MG PO TBDP
4.0000 mg | ORAL_TABLET | Freq: Once | ORAL | Status: AC
  Administered 2016-09-09: 4 mg via ORAL

## 2016-09-09 MED ORDER — OXYCODONE-ACETAMINOPHEN 5-325 MG PO TABS
ORAL_TABLET | ORAL | Status: AC
  Filled 2016-09-09: qty 1

## 2016-09-09 MED ORDER — SODIUM CHLORIDE 0.9 % IV BOLUS (SEPSIS)
1000.0000 mL | Freq: Once | INTRAVENOUS | Status: AC
Start: 2016-09-09 — End: 2016-09-09
  Administered 2016-09-09: 1000 mL via INTRAVENOUS

## 2016-09-09 MED ORDER — KETOROLAC TROMETHAMINE 30 MG/ML IJ SOLN
30.0000 mg | Freq: Once | INTRAMUSCULAR | Status: AC
  Administered 2016-09-09: 30 mg via INTRAVENOUS
  Filled 2016-09-09: qty 1

## 2016-09-09 MED ORDER — ONDANSETRON 4 MG PO TBDP
ORAL_TABLET | ORAL | Status: AC
  Filled 2016-09-09: qty 1

## 2016-09-09 MED ORDER — FAMOTIDINE IN NACL 20-0.9 MG/50ML-% IV SOLN
INTRAVENOUS | Status: AC
  Administered 2016-09-09: 20 mg via INTRAVENOUS
  Filled 2016-09-09: qty 50

## 2016-09-09 MED ORDER — CEPHALEXIN 500 MG PO CAPS
500.0000 mg | ORAL_CAPSULE | Freq: Once | ORAL | Status: DC
  Filled 2016-09-09: qty 1

## 2016-09-09 MED ORDER — ONDANSETRON 4 MG PO TBDP
4.0000 mg | ORAL_TABLET | Freq: Once | ORAL | Status: AC | PRN
  Administered 2016-09-09: 4 mg via ORAL
  Filled 2016-09-09: qty 1

## 2016-09-09 MED ORDER — OXYCODONE-ACETAMINOPHEN 5-325 MG PO TABS: 1.0000 | ORAL_TABLET | Freq: Four times a day (QID) | ORAL | 0 refills | Status: DC | PRN

## 2016-09-09 MED ORDER — TAMSULOSIN HCL 0.4 MG PO CAPS: 0.4000 mg | ORAL_CAPSULE | Freq: Every day | ORAL | 0 refills | Status: DC

## 2016-09-09 MED ORDER — OXYCODONE-ACETAMINOPHEN 5-325 MG PO TABS: 1.0000 | ORAL_TABLET | Freq: Once | ORAL | Status: DC

## 2016-09-09 MED ORDER — ONDANSETRON HCL 4 MG/2ML IJ SOLN: 4.0000 mg | INTRAMUSCULAR | Status: DC

## 2016-09-09 MED ORDER — OXYCODONE-ACETAMINOPHEN 5-325 MG PO TABS
1.0000 | ORAL_TABLET | Freq: Once | ORAL | Status: AC
  Administered 2016-09-09: 1 via ORAL
  Filled 2016-09-09: qty 1

## 2016-09-09 MED ORDER — PANTOPRAZOLE SODIUM 40 MG PO TBEC
40.0000 mg | DELAYED_RELEASE_TABLET | Freq: Once | ORAL | Status: AC
  Administered 2016-09-09: 40 mg via ORAL
  Filled 2016-09-09: qty 1

## 2016-09-09 MED ORDER — HYDROMORPHONE HCL 1 MG/ML IJ SOLN
INTRAMUSCULAR | Status: AC
  Administered 2016-09-09: 1 mg via INTRAVENOUS
  Filled 2016-09-09: qty 1

## 2016-09-09 MED ORDER — CEPHALEXIN 500 MG PO CAPS: 500.0000 mg | ORAL_CAPSULE | Freq: Three times a day (TID) | ORAL | 0 refills | Status: AC

## 2016-09-09 MED ORDER — KETOROLAC TROMETHAMINE 30 MG/ML IJ SOLN: 30.0000 mg | Freq: Once | INTRAMUSCULAR | Status: DC

## 2016-09-09 MED ORDER — MORPHINE SULFATE (PF) 4 MG/ML IV SOLN
4.0000 mg | Freq: Once | INTRAVENOUS | Status: AC
  Administered 2016-09-09: 4 mg via INTRAVENOUS
  Filled 2016-09-09: qty 1

## 2016-09-09 NOTE — ED Notes (Signed)
Pt reports stomach is no longer burning, pain only in left flank radiating to pubic

## 2016-09-09 NOTE — ED Notes (Signed)
Report given to Noel, RN 

## 2016-09-09 NOTE — ED Notes (Addendum)
Pt given medications. Pt and pt's visitor were insisting that pt was experiencing pain from peptic ulcers. Dr. Clearnce Hasten was told of pt's concern. Pt at first was refusing pain medication and stating ,"I want one of those cocktail things." Pt had just finished actively vomiting. Pt was educated that the pain and nausea was from a kidney stone. Pt's visitor stating that he has had kidney stones before and all we were doing "was dehydrating her with the pain medication." "you are going to make her peptic ulcers bleed from the pain medication." Dr. Clearnce Hasten was brought to bedside to speak with pt and pt's visitor. Doctor told pt she was not to have anything po since her nausea was not under control. Pt took pain medication and received Pepcid IV along with start of IV bolus.

## 2016-09-09 NOTE — ED Provider Notes (Signed)
-----------------------------------------   8:19 AM on 09/09/2016 -----------------------------------------  Patient care is him from Dr. Dineen Kid. Patient CT scan consistent with 4 mm ureteral stone which is likely the cause of the patient's discomfort. Patient states her pain is gone at this time but continues to feel nauseated. I discussed the options with the patient, she is agreeable to discharge home, she states the pain worsens or if she becomes too nauseated cannot keep down medications, spikes a fever or develops dysuria she is to return to the emergency department. Otherwise the patient will follow-up with urology. Patient agreeable to this plan.   Harvest Dark, MD 09/09/16 [phone]

## 2016-09-09 NOTE — ED Triage Notes (Signed)
Pt c/o pain in LLQ and into pelvic area.  Pt denies history of the same and denies hx of kidney stones.  Pt moaning in pain in triage.  Pt ambulatory to triage.

## 2016-09-09 NOTE — ED Notes (Signed)
Pt c/o increased nausea but slight improvement in pain; pt requesting somewhere to lay down; given zofran for nausea and moved with visitor to family waiting room; given warm blanket; CT scan results pending

## 2016-09-09 NOTE — ED Notes (Signed)
Pt threatened "trouble if somebody don't come in here after 40 minutes"   Sig other of pt has expressed considerable frustration about the ED being unable to resolve pt's pain permanently, pt has "burning stomach" pain in addition to kidney stone pain, pt wants IV meds for stomach pain

## 2016-09-09 NOTE — ED Provider Notes (Signed)
[facility] Emergency Department Provider Note  ____________________________________________   First MD Initiated Contact with Patient 09/09/16 [phone]     (approximate)  I have reviewed the triage vital signs and the nursing notes.   HISTORY  Chief Complaint Abdominal Pain   HPI Debbie Santana is a 25 y.o. female with a history of back pain and sciatica who is presenting to the emergency department with sudden onset left lower quadrant abdominal pain radiating to her back at 5 PM yesterday. She reports associated nausea. Says she has had chills but no fever. No history of kidney stones.   Past Medical History:  Diagnosis Date  . Chronic back pain   . Sciatica     Patient Active Problem List   Diagnosis Date Noted  . Right hand fracture 10/06/2013  . Fracture of metacarpal shaft of right hand, closed 10/06/2013  . Spontaneous abortion in first trimester 04/21/2013    Past Surgical History:  Procedure Laterality Date  . BACK SURGERY    . HERNIA REPAIR      Prior to Admission medications   Medication Sig Start Date End Date Taking? Authorizing Provider  acetaminophen (TYLENOL) 500 MG tablet Take 500 mg by mouth every 6 (six) hours as needed for mild pain or moderate pain.    Historical Provider, MD  cyclobenzaprine (FLEXERIL) 10 MG tablet Take 1 tablet (10 mg total) by mouth 2 (two) times daily as needed for muscle spasms. 02/22/16   Delsa Grana, PA-C  ibuprofen (ADVIL,MOTRIN) 800 MG tablet Take 1 tablet (800 mg total) by mouth 3 (three) times daily. 02/22/16   Delsa Grana, PA-C  polyethylene glycol powder (GLYCOLAX/MIRALAX) powder Take 17 g by mouth daily. 02/22/16   Delsa Grana, PA-C  traMADol (ULTRAM) 50 MG tablet Take 1 tablet (50 mg total) by mouth every 12 (twelve) hours as needed for severe pain. 02/22/16   Delsa Grana, PA-C    Allergies Patient has no known allergies.  No family history on file.  Social History Social History  Substance Use  Topics  . Smoking status: Current Every Day Smoker    Packs/day: 1.00    Types: Cigarettes  . Smokeless tobacco: Never Used     Comment: never done snuff or chewing tobacco.  . Alcohol use No     Comment: occasionally    Review of Systems Constitutional: No fever Eyes: No visual changes. ENT: No sore throat. Cardiovascular: Denies chest pain. Respiratory: Denies shortness of breath. Gastrointestinal:  no vomiting.  No diarrhea.  No constipation. Genitourinary: Negative for dysuria. Musculoskeletal: As above Skin: Negative for rash. Neurological: Negative for headaches, focal weakness or numbness.  10-point ROS otherwise negative.  ____________________________________________   PHYSICAL EXAM:  VITAL SIGNS: ED Triage Vitals  Enc Vitals Group     BP 09/09/16 0020 (!) 126/91     Pulse Rate 09/09/16 0020 (!) 108     Resp 09/09/16 0020 (!) 24     Temp 09/09/16 0020 97.7 F (36.5 C)     Temp Source 09/09/16 0020 Oral     SpO2 09/09/16 0020 98 %     Weight 09/09/16 0020 227 lb (103 kg)     Height 09/09/16 0020 5\' 10"  (1.778 m)     Head Circumference --      Peak Flow --      Pain Score 09/09/16 0021 10     Pain Loc --      Pain Edu? --  Excl. in [place]? --     Constitutional: Alert and oriented. Rolling on the stretcher. Appears unable to find a position of comfort. Retching. Eyes: Conjunctivae are normal. PERRL. EOMI. Head: Atraumatic. Nose: No congestion/rhinnorhea. Mouth/Throat: Mucous membranes are moist.  Neck: No stridor.   Cardiovascular: Normal rate, regular rhythm. Grossly normal heart sounds.  Good peripheral circulation. Respiratory: Normal respiratory effort.  No retractions. Lungs CTAB. Gastrointestinal: Soft with moderate left lower quadrant tenderness palpation. No distention. Mild left CVA tenderness palpation. Musculoskeletal: No lower extremity tenderness nor edema.   Neurologic:  Normal speech and language. No gross focal neurologic deficits are  appreciated.  Skin:  Skin is warm, dry and intact. No rash noted. Psychiatric: Mood and affect are normal. Speech and behavior are normal.  ____________________________________________   LABS (all labs ordered are listed, but only abnormal results are displayed)  Labs Reviewed  COMPREHENSIVE METABOLIC PANEL - Abnormal; Notable for the following:       Result Value   Sodium 133 (*)    Potassium 3.3 (*)    Glucose, Bld 129 (*)    Creatinine, Ser 1.03 (*)    Total Bilirubin 0.2 (*)    All other components within normal limits  CBC - Abnormal; Notable for the following:    WBC 19.8 (*)    All other components within normal limits  URINALYSIS, COMPLETE (UACMP) WITH MICROSCOPIC - Abnormal; Notable for the following:    Color, Urine YELLOW (*)    APPearance HAZY (*)    Hgb urine dipstick LARGE (*)    Protein, ur 30 (*)    Leukocytes, UA SMALL (*)    Bacteria, UA RARE (*)    Squamous Epithelial / LPF 6-30 (*)    All other components within normal limits  LIPASE, BLOOD  POCT PREGNANCY, URINE   ____________________________________________  EKG   ____________________________________________  RADIOLOGY  CT Renal Stone Study (Final result)  Result time 09/09/16 02:22:16  Final result by Anner Crete, MD (09/09/16 02:22:16)           Narrative:   CLINICAL DATA: 25 year old female with left flank pain.  EXAM: CT ABDOMEN AND PELVIS WITHOUT CONTRAST  TECHNIQUE: Multidetector CT imaging of the abdomen and pelvis was performed following the standard protocol without IV contrast.  COMPARISON: Abdominal radiograph dated 02/21/2016 and CT dated 10/18/2015  FINDINGS: Evaluation of this exam is limited in the absence of intravenous contrast.  Lower chest: The visualized lung bases are clear.  No intra-abdominal free air or free fluid.  Hepatobiliary: No focal liver abnormality is seen. No gallstones, gallbladder wall thickening, or biliary dilatation.  Pancreas:  Unremarkable. No pancreatic ductal dilatation or surrounding inflammatory changes.  Spleen: Normal in size without focal abnormality.  Adrenals/Urinary Tract: There is a 4 mm distal left ureteral stone with mild left hydronephrosis. Correlation with urinalysis recommended to exclude superimposed UTI. The right kidney is unremarkable. The urinary bladder is predominantly collapsed.  Stomach/Bowel: Stomach is within normal limits. Appendix appears normal. No evidence of bowel wall thickening, distention, or inflammatory changes.  Vascular/Lymphatic: The abdominal aorta and IVC are grossly unremarkable on this noncontrast study. No portal venous gas identified. There is no adenopathy.  Reproductive: The uterus is anteverted and grossly unremarkable. Partially visualized low attenuating/cystic structures in the superficial soft tissues of the perineum likely represent Bartholin gland versus Gartner duct cysts.  Other: None  Musculoskeletal: No acute or significant osseous findings.  IMPRESSION: A 4 mm distal left ureteral stone with mild left hydronephrosis.  Electronically Signed By: Anner Crete M.D. On: 09/09/2016 02:22            ____________________________________________   PROCEDURES  Procedure(s) performed:   Procedures  Critical Care performed:   ____________________________________________   INITIAL IMPRESSION / ASSESSMENT AND PLAN / ED COURSE  Pertinent labs & imaging results that were available during my care of the patient were reviewed by me and considered in my medical decision making (see chart for details).  Clinical Course   ----------------------------------------- 4:27 AM on 09/09/2016 -----------------------------------------  Patient says that her pain is now down from a 10 out of 10 to a 6 out of 10.  Appears without any distress at this time. Resting comfortably on the stretcher. We'll give a dose of Toradol. I also  discussed the case with the urologist, Dr. Pilar Jarvis.  We discussed that the patient would still likely be appropriate for discharge as long as her pain is under control. Urologist did recommend discharge with antibiotics.  ----------------------------------------- 7:41 AM on 09/09/2016 -----------------------------------------  After Toradol the patient says that her lower abdominal pain is gone as well as her left lower back pain. However, she says it redictated. She is now having moderate to severe upper abdominal pain. She is rolling on the bed and holding her abdomen. She is concerned that the medications used for her kidney stone could've exacerbated her ongoing gastrointestinal issues. Despite giving a GI cocktail as well as omeprazole she is still having upper abdominal pain. We will try an additional dose of morphine. Plan to admit if she is still in distress after the morphine. Signed out to Dr. Kerman Passey. ____________________________________________   FINAL CLINICAL IMPRESSION(S) / ED DIAGNOSES  Kidney stone. Upper abdominal pain.   NEW MEDICATIONS STARTED DURING THIS VISIT:  New Prescriptions   No medications on file     Note:  This document was prepared using Dragon voice recognition software and may include unintentional dictation errors.    Orbie Pyo, MD 09/09/16 [phone]

## 2016-09-09 NOTE — ED Notes (Addendum)
Pt stating that she is having left back pain and flank. Pt stating pain started around 1900. Pt has n/v along with flank pain. Pt stating that she is having pain with urination. Pt denying blood in her urine. Pt is currently moaning and moving around in bed at this time.

## 2016-09-10 ENCOUNTER — Emergency Department: Payer: Self-pay

## 2016-09-10 ENCOUNTER — Encounter: Payer: Self-pay | Admitting: Emergency Medicine

## 2016-09-10 ENCOUNTER — Emergency Department
Admission: EM | Admit: 2016-09-10 | Discharge: 2016-09-10 | Disposition: A | Discharge status: Home / Self Care | Payer: Self-pay | Attending: Emergency Medicine | Admitting: Emergency Medicine

## 2016-09-10 DIAGNOSIS — Z791 Long term (current) use of non-steroidal anti-inflammatories (NSAID): Secondary | ICD-10-CM | POA: Insufficient documentation

## 2016-09-10 DIAGNOSIS — E860 Dehydration: Secondary | ICD-10-CM | POA: Insufficient documentation

## 2016-09-10 DIAGNOSIS — Z79899 Other long term (current) drug therapy: Secondary | ICD-10-CM | POA: Insufficient documentation

## 2016-09-10 DIAGNOSIS — N201 Calculus of ureter: Secondary | ICD-10-CM | POA: Insufficient documentation

## 2016-09-10 DIAGNOSIS — F17210 Nicotine dependence, cigarettes, uncomplicated: Secondary | ICD-10-CM | POA: Insufficient documentation

## 2016-09-10 DIAGNOSIS — N23 Unspecified renal colic: Secondary | ICD-10-CM | POA: Insufficient documentation

## 2016-09-10 LAB — URINALYSIS, COMPLETE (UACMP) WITH MICROSCOPIC
Bacteria, UA: NONE SEEN
Bilirubin Urine: NEGATIVE
GLUCOSE, UA: NEGATIVE mg/dL
KETONES UR: 80 mg/dL — AB
Nitrite: NEGATIVE
PH: 5 (ref 5.0–8.0)
Protein, ur: 30 mg/dL — AB
SPECIFIC GRAVITY, URINE: 1.023 (ref 1.005–1.030)

## 2016-09-10 LAB — BASIC METABOLIC PANEL
ANION GAP: 8 (ref 5–15)
BUN: 10 mg/dL (ref 6–20)
CALCIUM: 9.4 mg/dL (ref 8.9–10.3)
CO2: 25 mmol/L (ref 22–32)
Chloride: 106 mmol/L (ref 101–111)
Creatinine, Ser: 0.93 mg/dL (ref 0.44–1.00)
GFR calc Af Amer: >60 mL/min (ref >60)
Glucose, Bld: 141 mg/dL — ABNORMAL HIGH (ref 65–99)
POTASSIUM: 3.7 mmol/L (ref 3.5–5.1)
SODIUM: 139 mmol/L (ref 135–145)

## 2016-09-10 LAB — PREGNANCY, URINE: Preg Test, Ur: NEGATIVE

## 2016-09-10 MED ORDER — HALOPERIDOL LACTATE 5 MG/ML IJ SOLN
2.5000 mg | Freq: Once | INTRAMUSCULAR | Status: AC
  Administered 2016-09-10: 2.5 mg via INTRAVENOUS

## 2016-09-10 MED ORDER — KETOROLAC TROMETHAMINE 30 MG/ML IJ SOLN
INTRAMUSCULAR | Status: AC
  Administered 2016-09-10: 15 mg
  Filled 2016-09-10: qty 1

## 2016-09-10 MED ORDER — ONDANSETRON HCL 4 MG/2ML IJ SOLN
4.0000 mg | Freq: Once | INTRAMUSCULAR | Status: AC
  Administered 2016-09-10: 4 mg via INTRAVENOUS

## 2016-09-10 MED ORDER — OXYCODONE-ACETAMINOPHEN 5-325 MG PO TABS
ORAL_TABLET | ORAL | Status: AC
  Administered 2016-09-10: 1 via ORAL
  Filled 2016-09-10: qty 1

## 2016-09-10 MED ORDER — SODIUM CHLORIDE 0.9 % IV BOLUS (SEPSIS)
1000.0000 mL | Freq: Once | INTRAVENOUS | Status: AC
  Administered 2016-09-10: 1000 mL via INTRAVENOUS

## 2016-09-10 MED ORDER — PROMETHAZINE HCL 25 MG RE SUPP: 25.0000 mg | Freq: Four times a day (QID) | RECTAL | 0 refills | Status: DC | PRN

## 2016-09-10 MED ORDER — TAMSULOSIN HCL 0.4 MG PO CAPS
0.4000 mg | ORAL_CAPSULE | ORAL | Status: AC
  Administered 2016-09-10: 0.4 mg via ORAL
  Filled 2016-09-10 (×2): qty 1

## 2016-09-10 MED ORDER — HALOPERIDOL LACTATE 5 MG/ML IJ SOLN
INTRAMUSCULAR | Status: AC
  Administered 2016-09-10: 2.5 mg via INTRAVENOUS
  Filled 2016-09-10: qty 1

## 2016-09-10 MED ORDER — OXYCODONE-ACETAMINOPHEN 5-325 MG PO TABS
1.0000 | ORAL_TABLET | Freq: Once | ORAL | Status: AC
  Administered 2016-09-10: 1 via ORAL

## 2016-09-10 MED ORDER — KETOROLAC TROMETHAMINE 15 MG/ML IJ SOLN: 15.0000 mg | Freq: Once | INTRAMUSCULAR | Status: DC

## 2016-09-10 MED ORDER — ONDANSETRON HCL 4 MG/2ML IJ SOLN
INTRAMUSCULAR | Status: AC
  Filled 2016-09-10: qty 2

## 2016-09-10 NOTE — ED Notes (Signed)
Pt discharged home after verbalizing understanding of discharge instructions; nad noted. 

## 2016-09-10 NOTE — ED Notes (Signed)
Pt female friend to desk asking how long the wait is going to be.  Advised we don't give out that information.  Pt female friend states "Well they told me the other day about 15 times how long the wait was going to be and we were here for 16 hours"  I advised we could not give out wait times but that she was next to be seen by triage nurse and we may be able to give more information after triage.  Pt states " Well I can always go to another hospital if we need to"  I advised she should stay to be triaged and seen by he nurse.  Pt states "I will wait a while but I am not going to wait like I did the other night"

## 2016-09-10 NOTE — ED Notes (Signed)
Pt presents with kidney stone diagnosed yesterday. States she is worse today and that she "can't pee." Also reports hematuria and difficulty urinating. Pt crying and thrashing around during assessment.

## 2016-09-10 NOTE — ED Notes (Signed)
Pt given water and able to keep down

## 2016-09-10 NOTE — ED Triage Notes (Signed)
Pt to ED c/o n/v and pain to bladder.  States seen yesterday and dx with kidney stone sent home with zofran and pain medicine.  States woke up today and took zofran and "got worse".

## 2016-09-10 NOTE — ED Provider Notes (Signed)
[facility] Emergency Department Provider Note  ____________________________________________  Time seen: Approximately 5:29 PM  I have reviewed the triage vital signs and the nursing notes.   HISTORY  Chief Complaint Nausea; Emesis; and Nephrolithiasis    HPI Debbie Santana is a 25 y.o. female who complains of left lower quadrant abdominal pain radiating to the urethral area. She was seen in the ED yesterday, diagnosed with renal colic with a 4 mm stone at the left UVJ. She reports that today her pain was worse and she was unable to tolerate her oral disintegrating Zofran or other medications and unable to take her pain medicine. She returns to the ED for symptom control. No fever. No dizziness or syncope. No other new complaints. No chest pain or shortness of breath.     Past Medical History:  Diagnosis Date  . Chronic back pain   . Sciatica      Patient Active Problem List   Diagnosis Date Noted  . Right hand fracture 10/06/2013  . Fracture of metacarpal shaft of right hand, closed 10/06/2013  . Spontaneous abortion in first trimester 04/21/2013     Past Surgical History:  Procedure Laterality Date  . BACK SURGERY    . HERNIA REPAIR       Prior to Admission medications   Medication Sig Start Date End Date Taking? Authorizing Provider  acetaminophen (TYLENOL) 500 MG tablet Take 500 mg by mouth every 6 (six) hours as needed for mild pain or moderate pain.    Historical Provider, MD  cephALEXin (KEFLEX) 500 MG capsule Take 1 capsule (500 mg total) by mouth 3 (three) times daily. 09/09/16 09/19/16  Orbie Pyo, MD  cyclobenzaprine (FLEXERIL) 10 MG tablet Take 1 tablet (10 mg total) by mouth 2 (two) times daily as needed for muscle spasms. Patient not taking: Reported on 09/09/2016 02/22/16   Delsa Grana, PA-C  ibuprofen (ADVIL,MOTRIN) 800 MG tablet Take 1 tablet (800 mg total) by mouth 3 (three) times daily. Patient not taking:  Reported on 09/09/2016 02/22/16   Delsa Grana, PA-C  ondansetron (ZOFRAN ODT) 4 MG disintegrating tablet Take 1 tablet (4 mg total) by mouth every 8 (eight) hours as needed for nausea or vomiting. 09/09/16   Harvest Dark, MD  ondansetron (ZOFRAN ODT) 4 MG disintegrating tablet Take 1 tablet (4 mg total) by mouth every 8 (eight) hours as needed for nausea or vomiting. 09/09/16   Harvest Dark, MD  oxyCODONE-acetaminophen (ROXICET) 5-325 MG tablet Take 1-2 tablets by mouth every 6 (six) hours as needed. 09/09/16   Orbie Pyo, MD  oxyCODONE-acetaminophen (ROXICET) 5-325 MG tablet Take 1 tablet by mouth every 6 (six) hours as needed. 09/09/16   Harvest Dark, MD  promethazine (PHENERGAN) 25 MG suppository Place 1 suppository (25 mg total) rectally every 6 (six) hours as needed for nausea. 09/10/16   Carrie Mew, MD  tamsulosin (FLOMAX) 0.4 MG CAPS capsule Take 1 capsule (0.4 mg total) by mouth daily. 09/09/16   Orbie Pyo, MD  tamsulosin (FLOMAX) 0.4 MG CAPS capsule Take 1 capsule (0.4 mg total) by mouth daily. 09/09/16   Harvest Dark, MD  traMADol (ULTRAM) 50 MG tablet Take 1 tablet (50 mg total) by mouth every 12 (twelve) hours as needed for severe pain. Patient not taking: Reported on 09/09/2016 02/22/16   Delsa Grana, PA-C     Allergies Patient has no known allergies.   History reviewed. No pertinent family history.  Social History Social History  Substance Use  Topics  . Smoking status: Current Every Day Smoker    Packs/day: 1.00    Types: Cigarettes  . Smokeless tobacco: Never Used     Comment: never done snuff or chewing tobacco.  . Alcohol use No     Comment: occasionally    Review of Systems  Constitutional:   No fever or chills.  ENT:   No sore throat. No rhinorrhea. Cardiovascular:   No chest pain. Respiratory:   No dyspnea or cough. Gastrointestinal:   Positive left lower quadrant abdominal pain with vomiting.  Genitourinary:    Positive hesitancy. Positive hematuria. Musculoskeletal:   Negative for focal pain or swelling Neurological:   Negative for headaches 10-point ROS otherwise negative.  ____________________________________________   PHYSICAL EXAM:  VITAL SIGNS: ED Triage Vitals [09/10/16 1409]  Enc Vitals Group     BP 137/77     Pulse Rate (!) 58     Resp 18     Temp 99.1 F (37.3 C)     Temp Source Oral     SpO2 100 %     Weight 227 lb (103 kg)     Height 5\' 10"  (1.778 m)     Head Circumference      Peak Flow      Pain Score 10     Pain Loc      Pain Edu?      Excl. in [place]?     Vital signs reviewed, nursing assessments reviewed.   Constitutional:   Alert and oriented. Well appearing and in no distress. Eyes:   No scleral icterus. No conjunctival pallor. PERRL. EOMI.  No nystagmus. ENT   Head:   Normocephalic and atraumatic.   Nose:   No congestion/rhinnorhea. No septal hematoma   Mouth/Throat:   MMM, no pharyngeal erythema. No peritonsillar mass.    Neck:   No stridor. No SubQ emphysema. No meningismus. Hematological/Lymphatic/Immunilogical:   No cervical lymphadenopathy. Cardiovascular:   RRR. Symmetric bilateral radial and DP pulses.  No murmurs.  Respiratory:   Normal respiratory effort without tachypnea nor retractions. Breath sounds are clear and equal bilaterally. No wheezes/rales/rhonchi. Gastrointestinal:   Soft and nontender. Non distended. There is no CVA tenderness.  No rebound, rigidity, or guarding. Genitourinary:   deferred Musculoskeletal:   Nontender with normal range of motion in all extremities. No joint effusions.  No lower extremity tenderness.  No edema. Neurologic:   Normal speech and language.  CN 2-10 normal. Motor grossly intact. No gross focal neurologic deficits are appreciated.  Skin:    Skin is warm, dry and intact. No rash noted.  No petechiae, purpura, or bullae.  ____________________________________________    LABS (pertinent  positives/negatives) (all labs ordered are listed, but only abnormal results are displayed) Labs Reviewed  BASIC METABOLIC PANEL - Abnormal; Notable for the following:       Result Value   Glucose, Bld 141 (*)    All other components within normal limits  URINALYSIS, COMPLETE (UACMP) WITH MICROSCOPIC - Abnormal; Notable for the following:    Color, Urine YELLOW (*)    APPearance TURBID (*)    Hgb urine dipstick LARGE (*)    Ketones, ur 80 (*)    Protein, ur 30 (*)    Leukocytes, UA SMALL (*)    Squamous Epithelial / LPF 6-30 (*)    All other components within normal limits  URINE CULTURE  PREGNANCY, URINE   ____________________________________________   EKG    ____________________________________________    RADIOLOGY  KUB  shows 4 mm stone situated at the area of the left UVJ still.  ____________________________________________   PROCEDURES Procedures  ____________________________________________   INITIAL IMPRESSION / ASSESSMENT AND PLAN / ED COURSE  Pertinent labs & imaging results that were available during my care of the patient were reviewed by me and considered in my medical decision making (see chart for details).  Patient well appearing no acute distress, presents with continuation of symptoms previously evaluated yesterday found to be due to ureteral colic. She is having trouble with oral tolerance this morning. Here in the ED, she was given antiemetics and analgesics as well as IV fluids, symptoms are much more controlled and she is tolerating oral intake. Workup does not reveal any other new complications. No evidence of worsening urinary tract infection. No evidence of renal insufficiency or acute kidney injury. Vital signs unremarkable. Patient is not in distress and not septic. Suitable for outpatient follow-up with urology as previously planned. I'll provide her prescription for Phenergan suppository in case nausea again becomes a limiting factor since the  Zofran seemed to be insufficient at that time.  Considering the patient's symptoms, medical history, and physical examination today, I have low suspicion for cholecystitis or biliary pathology, pancreatitis, perforation or bowel obstruction, hernia, intra-abdominal abscess, AAA or dissection, volvulus or intussusception, mesenteric ischemia, or appendicitis.  Low suspicion for torsion PID or pregnancy. Pregnancy test negative   Clinical Course    ____________________________________________   FINAL CLINICAL IMPRESSION(S) / ED DIAGNOSES  Final diagnoses:  Ureterolithiasis  Renal colic on left side  Dehydration      New Prescriptions   PROMETHAZINE (PHENERGAN) 25 MG SUPPOSITORY    Place 1 suppository (25 mg total) rectally every 6 (six) hours as needed for nausea.     Portions of this note were generated with dragon dictation software. Dictation errors may occur despite best attempts at proofreading.    Carrie Mew, MD 09/10/16 [id number]

## 2016-09-12 LAB — URINE CULTURE

## 2016-10-25 ENCOUNTER — Emergency Department (HOSPITAL_COMMUNITY)
Admission: EM | Admit: 2016-10-25 | Discharge: 2016-10-25 | Disposition: A | Discharge status: Home / Self Care | Payer: Self-pay | Attending: Emergency Medicine | Admitting: Emergency Medicine

## 2016-10-25 ENCOUNTER — Encounter (HOSPITAL_COMMUNITY): Payer: Self-pay

## 2016-10-25 ENCOUNTER — Emergency Department (HOSPITAL_COMMUNITY): Payer: Self-pay

## 2016-10-25 DIAGNOSIS — R0602 Shortness of breath: Secondary | ICD-10-CM | POA: Insufficient documentation

## 2016-10-25 DIAGNOSIS — R112 Nausea with vomiting, unspecified: Secondary | ICD-10-CM | POA: Insufficient documentation

## 2016-10-25 DIAGNOSIS — Z79899 Other long term (current) drug therapy: Secondary | ICD-10-CM | POA: Insufficient documentation

## 2016-10-25 DIAGNOSIS — R0981 Nasal congestion: Secondary | ICD-10-CM | POA: Insufficient documentation

## 2016-10-25 DIAGNOSIS — M549 Dorsalgia, unspecified: Secondary | ICD-10-CM | POA: Insufficient documentation

## 2016-10-25 DIAGNOSIS — F17210 Nicotine dependence, cigarettes, uncomplicated: Secondary | ICD-10-CM | POA: Insufficient documentation

## 2016-10-25 DIAGNOSIS — R1032 Left lower quadrant pain: Secondary | ICD-10-CM | POA: Insufficient documentation

## 2016-10-25 DIAGNOSIS — R109 Unspecified abdominal pain: Secondary | ICD-10-CM

## 2016-10-25 LAB — CBC
HEMATOCRIT: 44.7 % (ref 36.0–46.0)
HEMOGLOBIN: 15.7 g/dL — AB (ref 12.0–15.0)
MCH: 31.8 pg (ref 26.0–34.0)
MCHC: 35.1 g/dL (ref 30.0–36.0)
MCV: 90.7 fL (ref 78.0–100.0)
Platelets: 289 10*3/uL (ref 150–400)
RBC: 4.93 MIL/uL (ref 3.87–5.11)
RDW: 12.7 % (ref 11.5–15.5)
WBC: 18 10*3/uL — ABNORMAL HIGH (ref 4.0–10.5)

## 2016-10-25 LAB — URINALYSIS, ROUTINE W REFLEX MICROSCOPIC
BILIRUBIN URINE: NEGATIVE
Glucose, UA: NEGATIVE mg/dL
Hgb urine dipstick: NEGATIVE
KETONES UR: 5 mg/dL — AB
NITRITE: NEGATIVE
PROTEIN: NEGATIVE mg/dL
Specific Gravity, Urine: 1.014 (ref 1.005–1.030)
pH: 8 (ref 5.0–8.0)

## 2016-10-25 LAB — COMPREHENSIVE METABOLIC PANEL
ALBUMIN: 4 g/dL (ref 3.5–5.0)
ALK PHOS: 60 U/L (ref 38–126)
ALT: 17 U/L (ref 14–54)
ANION GAP: 9 (ref 5–15)
AST: 19 U/L (ref 15–41)
BUN: 7 mg/dL (ref 6–20)
CALCIUM: 9.2 mg/dL (ref 8.9–10.3)
CO2: 21 mmol/L — ABNORMAL LOW (ref 22–32)
Chloride: 105 mmol/L (ref 101–111)
Creatinine, Ser: 0.85 mg/dL (ref 0.44–1.00)
GFR calc non Af Amer: >60 mL/min (ref >60)
Glucose, Bld: 126 mg/dL — ABNORMAL HIGH (ref 65–99)
POTASSIUM: 3.4 mmol/L — AB (ref 3.5–5.1)
Sodium: 135 mmol/L (ref 135–145)
TOTAL PROTEIN: 6.7 g/dL (ref 6.5–8.1)
Total Bilirubin: 0.7 mg/dL (ref 0.3–1.2)

## 2016-10-25 LAB — INFLUENZA PANEL BY PCR (TYPE A & B)
Influenza A By PCR: NEGATIVE
Influenza B By PCR: NEGATIVE

## 2016-10-25 LAB — PREGNANCY, URINE: PREG TEST UR: NEGATIVE

## 2016-10-25 LAB — LIPASE, BLOOD: Lipase: 16 U/L (ref 11–51)

## 2016-10-25 MED ORDER — ONDANSETRON HCL 4 MG/2ML IJ SOLN
4.0000 mg | Freq: Once | INTRAMUSCULAR | Status: AC
Start: 2016-10-25 — End: 2016-10-25
  Administered 2016-10-25: 4 mg via INTRAVENOUS
  Filled 2016-10-25: qty 2

## 2016-10-25 MED ORDER — SODIUM CHLORIDE 0.9 % IV BOLUS (SEPSIS)
1000.0000 mL | Freq: Once | INTRAVENOUS | Status: AC
  Administered 2016-10-25: 1000 mL via INTRAVENOUS

## 2016-10-25 MED ORDER — ONDANSETRON HCL 4 MG/2ML IJ SOLN
4.0000 mg | Freq: Once | INTRAMUSCULAR | Status: AC
  Administered 2016-10-25: 4 mg via INTRAVENOUS
  Filled 2016-10-25: qty 2

## 2016-10-25 MED ORDER — FAMOTIDINE IN NACL 20-0.9 MG/50ML-% IV SOLN
20.0000 mg | INTRAVENOUS | Status: AC
  Administered 2016-10-25: 20 mg via INTRAVENOUS
  Filled 2016-10-25: qty 50

## 2016-10-25 MED ORDER — PROMETHAZINE HCL 25 MG/ML IJ SOLN
25.0000 mg | Freq: Once | INTRAMUSCULAR | Status: AC
  Administered 2016-10-25: 25 mg via INTRAMUSCULAR
  Filled 2016-10-25: qty 1

## 2016-10-25 MED ORDER — SODIUM CHLORIDE 0.9 % IV SOLN
Freq: Once | INTRAVENOUS | Status: AC
  Administered 2016-10-25: 13:00:00 via INTRAVENOUS

## 2016-10-25 MED ORDER — ONDANSETRON 4 MG PO TBDP: 4.0000 mg | ORAL_TABLET | Freq: Three times a day (TID) | ORAL | 0 refills | Status: DC | PRN

## 2016-10-25 MED ORDER — FENTANYL CITRATE (PF) 100 MCG/2ML IJ SOLN
50.0000 ug | Freq: Once | INTRAMUSCULAR | Status: AC
  Administered 2016-10-25: 50 ug via INTRAVENOUS
  Filled 2016-10-25: qty 2

## 2016-10-25 NOTE — ED Provider Notes (Signed)
[facility] DEPT Provider Note   CSN: [id number] Arrival date & time: 10/25/16  1207   By signing my name below, I, Hilbert Odor, attest that this documentation has been prepared under the direction and in the presence of Noemi Chapel, MD. Electronically Signed: Hilbert Odor, Scribe. 10/25/16. 1:31 PM. History   Chief Complaint Chief Complaint  Patient presents with  . Abdominal Pain     The history is provided by the patient and a friend. No language interpreter was used.   HPI Comments: Debbie Santana is a 25 y.o. female who presents to the Emergency Department complaining of abdominal pain since this morning. She reports associated nausea and vomiting over the past 3 days. Per friend: She has been vomiting mostly in the morning until today. She also reports having diarrhea x 2 every morning. She states that the diarrhea is a mix of loose stool and solid stool. She also reports having "hot flashes". She reports congestion, back pain, and some SOB. She denies rashes. She states that she had a hernia in the past but denies any other abdominal surgeries. She states that her LNMS was on 10/05/2016. She reports having a miscarriage in the past. On 09/09/2017, she was diagnosed with having a 53mm distal left urethral kidney stone. She does not remember passing the kidney stone.  Past Medical History:  Diagnosis Date  . Chronic back pain   . Sciatica     Patient Active Problem List   Diagnosis Date Noted  . Right hand fracture 10/06/2013  . Fracture of metacarpal shaft of right hand, closed 10/06/2013  . Spontaneous abortion in first trimester 04/21/2013    Past Surgical History:  Procedure Laterality Date  . BACK SURGERY    . HERNIA REPAIR      OB History    Gravida Para Term Preterm AB Living   1       1     SAB TAB Ectopic Multiple Live Births   1               Home Medications    Prior to Admission medications   Medication Sig Start Date End Date Taking?  Authorizing Provider  acetaminophen (TYLENOL) 500 MG tablet Take 500 mg by mouth every 6 (six) hours as needed for mild pain or moderate pain.    Historical Provider, MD  cyclobenzaprine (FLEXERIL) 10 MG tablet Take 1 tablet (10 mg total) by mouth 2 (two) times daily as needed for muscle spasms. Patient not taking: Reported on 09/09/2016 02/22/16   Delsa Grana, PA-C  ibuprofen (ADVIL,MOTRIN) 800 MG tablet Take 1 tablet (800 mg total) by mouth 3 (three) times daily. Patient not taking: Reported on 09/09/2016 02/22/16   Delsa Grana, PA-C  ondansetron (ZOFRAN ODT) 4 MG disintegrating tablet Take 1 tablet (4 mg total) by mouth every 8 (eight) hours as needed for nausea or vomiting. 09/09/16   Harvest Dark, MD  ondansetron (ZOFRAN ODT) 4 MG disintegrating tablet Take 1 tablet (4 mg total) by mouth every 8 (eight) hours as needed for nausea or vomiting. 09/09/16   Harvest Dark, MD  oxyCODONE-acetaminophen (ROXICET) 5-325 MG tablet Take 1-2 tablets by mouth every 6 (six) hours as needed. 09/09/16   Orbie Pyo, MD  oxyCODONE-acetaminophen (ROXICET) 5-325 MG tablet Take 1 tablet by mouth every 6 (six) hours as needed. 09/09/16   Harvest Dark, MD  promethazine (PHENERGAN) 25 MG suppository Place 1 suppository (25 mg total) rectally every 6 (six) hours as needed for  nausea. 09/10/16   Carrie Mew, MD  tamsulosin (FLOMAX) 0.4 MG CAPS capsule Take 1 capsule (0.4 mg total) by mouth daily. 09/09/16   Orbie Pyo, MD  tamsulosin (FLOMAX) 0.4 MG CAPS capsule Take 1 capsule (0.4 mg total) by mouth daily. 09/09/16   Harvest Dark, MD  traMADol (ULTRAM) 50 MG tablet Take 1 tablet (50 mg total) by mouth every 12 (twelve) hours as needed for severe pain. Patient not taking: Reported on 09/09/2016 02/22/16   Delsa Grana, PA-C    Family History No family history on file.  Social History Social History  Substance Use Topics  . Smoking status: Current Every Day Smoker     Packs/day: 1.00    Types: Cigarettes  . Smokeless tobacco: Never Used     Comment: never done snuff or chewing tobacco.  . Alcohol use No     Comment: occasionally     Allergies   Patient has no known allergies.   Review of Systems Review of Systems  HENT: Positive for congestion.   Respiratory: Positive for shortness of breath.   Gastrointestinal: Positive for abdominal pain, nausea and vomiting.  Musculoskeletal: Positive for back pain.  Skin: Negative for rash.  All other systems reviewed and are negative.    Physical Exam Updated Vital Signs BP 123/90 (BP Location: Left Arm)   Pulse 87   Temp 98.1 F (36.7 C) (Oral)   Resp 18   Ht 5\' 10"  (1.778 m)   Wt 225 lb (102.1 kg)   LMP 10/05/2016   SpO2 98%   BMI 32.28 kg/m   Physical Exam  Constitutional: She appears well-developed and well-nourished.  Uncomfortably appearing. She is actively vomiting during exam.  HENT:  Head: Normocephalic and atraumatic.  Eyes: Conjunctivae are normal. Right eye exhibits no discharge. Left eye exhibits no discharge.  Cardiovascular:  Borderline tachycardia.  Pulmonary/Chest: Effort normal. No respiratory distress.  Abdominal: There is tenderness. There is no guarding.  Tender across lower abdomen toward left. No guarding or peritoneal signs.  Neurological: She is alert. Coordination normal.  Skin: Skin is warm and dry. No rash noted. She is not diaphoretic. No erythema.  Psychiatric: She has a normal mood and affect.  Nursing note and vitals reviewed.    ED Treatments / Results  DIAGNOSTIC STUDIES: Oxygen Saturation is 98% on RA, normal by my interpretation.    COORDINATION OF CARE: 1:04 PM Discussed treatment plan with pt at bedside, which includes giving Zofran, and pt agreed to plan. I will also test her for the influenza.  Labs (all labs ordered are listed, but only abnormal results are displayed) Labs Reviewed  COMPREHENSIVE METABOLIC PANEL - Abnormal; Notable for  the following:       Result Value   Potassium 3.4 (*)    CO2 21 (*)    Glucose, Bld 126 (*)    All other components within normal limits  CBC - Abnormal; Notable for the following:    WBC 18.0 (*)    Hemoglobin 15.7 (*)    All other components within normal limits  LIPASE, BLOOD  URINALYSIS, ROUTINE W REFLEX MICROSCOPIC  INFLUENZA PANEL BY PCR (TYPE A & B)  PREGNANCY, URINE    EKG  EKG Interpretation None       Radiology No results found.  Procedures Procedures (including critical care time)  Medications Ordered in ED Medications  0.9 %  sodium chloride infusion ( Intravenous New Bag/Given 10/25/16 1252)  ondansetron (ZOFRAN) injection 4 mg (4 mg  Intravenous Given 10/25/16 1252)  sodium chloride 0.9 % bolus 1,000 mL (1,000 mLs Intravenous New Bag/Given 10/25/16 1323)  promethazine (PHENERGAN) injection 25 mg (25 mg Intramuscular Given 10/25/16 1324)  fentaNYL (SUBLIMAZE) injection 50 mcg (50 mcg Intravenous Given 10/25/16 1323)     Initial Impression / Assessment and Plan / ED Course  I have reviewed the triage vital signs and the nursing notes.  Pertinent labs & imaging results that were available during my care of the patient were reviewed by me and considered in my medical decision making (see chart for details).     I have reevaluated the patient, I performed a second abdominal exam and she now has no tenderness but states that she is only nauseated when I examine her abdomen. There is no guarding, no peritoneal signs, no lateralizing discomfort. I have reviewed all of her results with her including her elevated white blood cell count which seems nonspecific and may just be reactionary to the vomiting. She is no longer vomiting, she has been given 2 L of IV fluids and has been given Zofran, Phenergan and some fentanyl which she states helped immensely. She is not pregnant, has no urinary tract infection and no renal dysfunction. She is flu negative, stable for discharge at  this time. She has expressed her understanding to her results and the indications for return. She will be given follow-up list.  Final Clinical Impressions(s) / ED Diagnoses   Final diagnoses:  Nausea and vomiting, intractability of vomiting not specified, unspecified vomiting type  Abdominal pain, unspecified abdominal location    New Prescriptions New Prescriptions   No medications on file   I personally performed the services described in this documentation, which was scribed in my presence. The recorded information has been reviewed and is accurate.       Noemi Chapel, MD 10/25/16 [phone]

## 2016-10-25 NOTE — Discharge Instructions (Signed)
Please obtain all of your results from medical records or have your doctors office obtain the results - share them with your doctor - you should be seen at your doctors office in the next 2 days. Call today to arrange your follow up. Take the medications as prescribed. Please review all of the medicines and only take them if you do not have an allergy to them. Please be aware that if you are taking birth control pills, taking other prescriptions, ESPECIALLY ANTIBIOTICS may make the birth control ineffective - if this is the case, either do not engage in sexual activity or use alternative methods of birth control such as condoms until you have finished the medicine and your family doctor says it is OK to restart them. If you are on a blood thinner such as COUMADIN, be aware that any other medicine that you take may cause the coumadin to either work too much, or not enough - you should have your coumadin level rechecked in next 7 days if this is the case.  °?  °It is also a possibility that you have an allergic reaction to any of the medicines that you have been prescribed - Everybody reacts differently to medications and while MOST people have no trouble with most medicines, you may have a reaction such as nausea, vomiting, rash, swelling, shortness of breath. If this is the case, please stop taking the medicine immediately and contact your physician.  °?  °You should return to the ER if you develop severe or worsening symptoms.  ° °[facility] Doctor List ° ° ° °Edward Hawkins MD. Specialty: Pulmonary Disease Contact information: [address]  °[address]  °[place] [address]  °[phone]  ° °Margaret Simpson, MD. Specialty: Family Medicine Contact information: [address]  °[place] [address]  °[phone]  ° °Scott Luking, MD. Specialty: Family Medicine Contact information: [address]  °[address]  °[place] [address]  °[phone]  ° °Tesfaye Fanta, MD Specialty:  Internal Medicine Contact information: [address]  °[place] [address]  °[phone]  ° °Zach Hall, MD. Specialty: Internal Medicine Contact information: [address]  °[place] [address]  °[phone]  ° °Angus Mcinnis, MD. Specialty: Family Medicine Contact information: [address]  °[place] [address]  °[phone]  ° °Stephen Knowlton, MD. Specialty: Family Medicine Contact information: [address]  °[address]  °[place] [address]  °[phone]  ° °Roy Fagan, MD. Specialty: Internal Medicine Contact information: [address]  °[address]  °[place]  [address]  °[phone]  ° °

## 2016-10-25 NOTE — ED Triage Notes (Signed)
Pt reports that she has had episodes of N/V for 2 days.She woke up this morning with mid to lower abdominal pain. Denies urinary symptoms

## 2016-10-25 NOTE — ED Notes (Signed)
Pt actively vomiting.

## 2016-12-10 ENCOUNTER — Encounter (HOSPITAL_COMMUNITY): Payer: Self-pay | Admitting: Emergency Medicine

## 2016-12-10 ENCOUNTER — Emergency Department (HOSPITAL_COMMUNITY)
Admission: EM | Admit: 2016-12-10 | Discharge: 2016-12-10 | Disposition: A | Discharge status: Home / Self Care | Payer: Medicaid Other | Attending: Emergency Medicine | Admitting: Emergency Medicine

## 2016-12-10 DIAGNOSIS — Z79899 Other long term (current) drug therapy: Secondary | ICD-10-CM | POA: Insufficient documentation

## 2016-12-10 DIAGNOSIS — N751 Abscess of Bartholin's gland: Secondary | ICD-10-CM | POA: Insufficient documentation

## 2016-12-10 DIAGNOSIS — F17210 Nicotine dependence, cigarettes, uncomplicated: Secondary | ICD-10-CM | POA: Insufficient documentation

## 2016-12-10 MED ORDER — SULFAMETHOXAZOLE-TRIMETHOPRIM 800-160 MG PO TABS
1.0000 | ORAL_TABLET | Freq: Once | ORAL | Status: AC
  Administered 2016-12-10: 1 via ORAL
  Filled 2016-12-10: qty 1

## 2016-12-10 MED ORDER — SULFAMETHOXAZOLE-TRIMETHOPRIM 800-160 MG PO TABS: 1.0000 | ORAL_TABLET | Freq: Two times a day (BID) | ORAL | 0 refills | Status: AC

## 2016-12-10 MED ORDER — POVIDONE-IODINE 10 % EX SOLN
CUTANEOUS | Status: AC
  Filled 2016-12-10: qty 118

## 2016-12-10 MED ORDER — HYDROCODONE-ACETAMINOPHEN 5-325 MG PO TABS: ORAL_TABLET | ORAL | 0 refills | Status: DC

## 2016-12-10 MED ORDER — OXYCODONE-ACETAMINOPHEN 5-325 MG PO TABS
1.0000 | ORAL_TABLET | Freq: Once | ORAL | Status: AC
  Administered 2016-12-10: 1 via ORAL
  Filled 2016-12-10: qty 1

## 2016-12-10 MED ORDER — LIDOCAINE HCL (PF) 2 % IJ SOLN
INTRAMUSCULAR | Status: AC
  Filled 2016-12-10: qty 10

## 2016-12-10 NOTE — ED Triage Notes (Signed)
barthalon cyst x 2days. Pt has history of these cyst

## 2016-12-10 NOTE — Discharge Instructions (Signed)
Warm water soaks.  You can remove the packing in 2 days. Call the OB/GYN office to arrange a follow-up appt.

## 2016-12-13 NOTE — ED Provider Notes (Signed)
[facility] DEPT Provider Note   CSN: [id number] Arrival date & time: 12/10/16  1213     History   Chief Complaint Chief Complaint  Patient presents with  . Abscess    HPI Debbie Santana is a 26 y.o. female.  HPI  Debbie Santana is a 26 y.o. female who presents to the Emergency Department complaining of pain and swelling of the right vaginal area for 2 days.  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  She states pain is worse with movement.  She denies vaginal bleeding, discharge, fever and abdominal pain.  Past Medical History:  Diagnosis Date  . Chronic back pain   . Sciatica     Patient Active Problem List   Diagnosis Date Noted  . Right hand fracture 10/06/2013  . Fracture of metacarpal shaft of right hand, closed 10/06/2013  . Spontaneous abortion in first trimester 04/21/2013    Past Surgical History:  Procedure Laterality Date  . BACK SURGERY    . HERNIA REPAIR      OB History    Gravida Para Term Preterm AB Living   1       1     SAB TAB Ectopic Multiple Live Births   1               Home Medications    Prior to Admission medications   Medication Sig Start Date End Date Taking? Authorizing Provider  acetaminophen (TYLENOL) 500 MG tablet Take 500 mg by mouth every 6 (six) hours as needed for mild pain or moderate pain.    Historical Provider, MD  HYDROcodone-acetaminophen (NORCO/VICODIN) 5-325 MG tablet Take one-two tabs po q 4-6 hrs prn pain 12/10/16   Rayman Petrosian, PA-C  ondansetron (ZOFRAN ODT) 4 MG disintegrating tablet Take 1 tablet (4 mg total) by mouth every 8 (eight) hours as needed for nausea. 10/25/16   Noemi Chapel, MD  ranitidine (ZANTAC) 150 MG tablet Take 300 mg by mouth daily.    Historical Provider, MD  sulfamethoxazole-trimethoprim (BACTRIM DS,SEPTRA DS) 800-160 MG tablet Take 1 tablet by mouth 2 (two) times daily. 12/10/16 12/17/16  Tamar Miano, PA-C    Family History No family history on  file.  Social History Social History  Substance Use Topics  . Smoking status: Current Every Day Smoker    Packs/day: 1.00    Types: Cigarettes  . Smokeless tobacco: Never Used     Comment: never done snuff or chewing tobacco.  . Alcohol use No     Comment: occasionally     Allergies   Patient has no known allergies.   Review of Systems Review of Systems  Constitutional: Negative for chills and fever.  Gastrointestinal: Negative for abdominal pain, nausea and vomiting.  Genitourinary: Negative for difficulty urinating and dysuria.       Pain and swelling of right vaginal area  Musculoskeletal: Negative for arthralgias and joint swelling.  Skin: Negative for color change.          Hematological: Negative for adenopathy.  All other systems reviewed and are negative.    Physical Exam Updated Vital Signs BP 132/74 (BP Location: Right Arm)   Pulse (!) 103   Temp 98.2 F (36.8 C) (Oral)   Resp 18   Ht 5\' 11"  (1.803 m)   Wt 99.3 kg   LMP 11/27/2016   SpO2 98%   BMI 30.54 kg/m   Physical Exam  Constitutional: She is oriented to person, place,  and time. She appears well-developed and well-nourished. No distress.  HENT:  Head: Normocephalic and atraumatic.  Cardiovascular: Normal rate, regular rhythm and normal heart sounds.   No murmur heard. Pulmonary/Chest: Effort normal and breath sounds normal. No respiratory distress.  Abdominal: Soft. She exhibits no distension. There is no tenderness. There is no guarding.  Genitourinary:  Genitourinary Comments: Focal area of tenderness and edema of the Bartholin's gland.  No erythema  Neurological: She is alert and oriented to person, place, and time. She exhibits normal muscle tone. Coordination normal.  Skin: Skin is warm and dry. Capillary refill takes less than 2 seconds. No erythema.  Nursing note and vitals reviewed.    ED Treatments / Results  Labs (all labs ordered are listed, but only abnormal results are  displayed) Labs Reviewed - No data to display  EKG  EKG Interpretation None       Radiology No results found.  Procedures Procedures (including critical care time)   INCISION AND DRAINAGE Performed by: Hale Bogus. Consent: Verbal consent obtained. Risks and benefits: risks, benefits and alternatives were discussed Type: abscess  Body area: Bartholin's gland  Anesthesia: local infiltration  Incision was made with a #11 scalpel.  Local anesthetic: lidocaine 2 % w/o epinephrine  Anesthetic total: 4 ml  Complexity: complex Blunt dissection to break up loculations  Drainage: purulent  Drainage amount: large Packing material: 1/2 in iodoform gauze  Patient tolerance: Patient tolerated the procedure well with no immediate complications.    Medications Ordered in ED Medications  sulfamethoxazole-trimethoprim (BACTRIM DS,SEPTRA DS) 800-160 MG per tablet 1 tablet (1 tablet Oral Given 12/10/16 1515)  oxyCODONE-acetaminophen (PERCOCET/ROXICET) 5-325 MG per tablet 1 tablet (1 tablet Oral Given 12/10/16 1515)     Initial Impression / Assessment and Plan / ED Course  I have reviewed the triage vital signs and the nursing notes.  Pertinent labs & imaging results that were available during my care of the patient were reviewed by me and considered in my medical decision making (see chart for details).     Pt non-toxic appearing.  Pain improved after I&D.  Agrees to GYN f/u since this is recurrent.  Rx for abx. Return precautions given  Final Clinical Impressions(s) / ED Diagnoses   Final diagnoses:  Bartholin's gland abscess    New Prescriptions Discharge Medication List as of 12/10/2016  3:07 PM    START taking these medications   Details  HYDROcodone-acetaminophen (NORCO/VICODIN) 5-325 MG tablet Take one-two tabs po q 4-6 hrs prn pain, Print    sulfamethoxazole-trimethoprim (BACTRIM DS,SEPTRA DS) 800-160 MG tablet Take 1 tablet by mouth 2 (two) times  daily., Starting Tue 12/10/2016, Until Tue 12/17/2016, [place], PA-C 12/13/16 2313    Milton Ferguson, MD 12/13/16 [phone]

## 2017-04-10 ENCOUNTER — Emergency Department (HOSPITAL_COMMUNITY): Payer: Self-pay

## 2017-04-10 ENCOUNTER — Emergency Department (HOSPITAL_COMMUNITY)
Admission: EM | Admit: 2017-04-10 | Discharge: 2017-04-10 | Disposition: A | Discharge status: Home / Self Care | Payer: Self-pay | Attending: Emergency Medicine | Admitting: Emergency Medicine

## 2017-04-10 ENCOUNTER — Encounter (HOSPITAL_COMMUNITY): Payer: Self-pay | Admitting: Emergency Medicine

## 2017-04-10 DIAGNOSIS — F17210 Nicotine dependence, cigarettes, uncomplicated: Secondary | ICD-10-CM | POA: Insufficient documentation

## 2017-04-10 DIAGNOSIS — R197 Diarrhea, unspecified: Secondary | ICD-10-CM | POA: Insufficient documentation

## 2017-04-10 DIAGNOSIS — R112 Nausea with vomiting, unspecified: Secondary | ICD-10-CM

## 2017-04-10 DIAGNOSIS — R1010 Upper abdominal pain, unspecified: Secondary | ICD-10-CM

## 2017-04-10 LAB — URINALYSIS, ROUTINE W REFLEX MICROSCOPIC
BILIRUBIN URINE: NEGATIVE
GLUCOSE, UA: NEGATIVE mg/dL
HGB URINE DIPSTICK: NEGATIVE
Ketones, ur: 5 mg/dL — AB
LEUKOCYTES UA: NEGATIVE
NITRITE: NEGATIVE
PH: 7 (ref 5.0–8.0)
Protein, ur: 30 mg/dL — AB
RBC / HPF: NONE SEEN RBC/hpf (ref 0–5)
Specific Gravity, Urine: 1.025 (ref 1.005–1.030)

## 2017-04-10 LAB — COMPREHENSIVE METABOLIC PANEL
ALT: 14 U/L (ref 14–54)
AST: 18 U/L (ref 15–41)
Albumin: 4.2 g/dL (ref 3.5–5.0)
Alkaline Phosphatase: 63 U/L (ref 38–126)
Anion gap: 10 (ref 5–15)
BUN: 6 mg/dL (ref 6–20)
CO2: 22 mmol/L (ref 22–32)
CREATININE: 0.99 mg/dL (ref 0.44–1.00)
Calcium: 9.6 mg/dL (ref 8.9–10.3)
Chloride: 107 mmol/L (ref 101–111)
Glucose, Bld: 111 mg/dL — ABNORMAL HIGH (ref 65–99)
Potassium: 3.5 mmol/L (ref 3.5–5.1)
Sodium: 139 mmol/L (ref 135–145)
TOTAL PROTEIN: 6.7 g/dL (ref 6.5–8.1)
Total Bilirubin: 0.8 mg/dL (ref 0.3–1.2)

## 2017-04-10 LAB — I-STAT BETA HCG BLOOD, ED (MC, WL, AP ONLY): I-stat hCG, quantitative: <5 m[IU]/mL (ref <5)

## 2017-04-10 LAB — CBC
HCT: 42.5 % (ref 36.0–46.0)
Hemoglobin: 14.8 g/dL (ref 12.0–15.0)
MCH: 31.6 pg (ref 26.0–34.0)
MCHC: 34.8 g/dL (ref 30.0–36.0)
MCV: 90.8 fL (ref 78.0–100.0)
PLATELETS: 265 10*3/uL (ref 150–400)
RBC: 4.68 MIL/uL (ref 3.87–5.11)
RDW: 13 % (ref 11.5–15.5)
WBC: 15 10*3/uL — AB (ref 4.0–10.5)

## 2017-04-10 LAB — LIPASE, BLOOD: LIPASE: 88 U/L — AB (ref 11–51)

## 2017-04-10 MED ORDER — SODIUM CHLORIDE 0.9 % IV BOLUS (SEPSIS)
1000.0000 mL | Freq: Once | INTRAVENOUS | Status: AC
  Administered 2017-04-10: 1000 mL via INTRAVENOUS

## 2017-04-10 MED ORDER — ONDANSETRON 4 MG PO TBDP
4.0000 mg | ORAL_TABLET | Freq: Once | ORAL | Status: AC | PRN
  Administered 2017-04-10: 4 mg via ORAL

## 2017-04-10 MED ORDER — ONDANSETRON 4 MG PO TBDP
ORAL_TABLET | ORAL | Status: AC
  Filled 2017-04-10: qty 1

## 2017-04-10 MED ORDER — PROMETHAZINE HCL 25 MG/ML IJ SOLN
12.5000 mg | Freq: Once | INTRAMUSCULAR | Status: AC
  Administered 2017-04-10: 12.5 mg via INTRAVENOUS
  Filled 2017-04-10: qty 1

## 2017-04-10 MED ORDER — PROMETHAZINE HCL 25 MG PO TABS: 25.0000 mg | ORAL_TABLET | Freq: Four times a day (QID) | ORAL | 0 refills | Status: DC | PRN

## 2017-04-10 MED ORDER — MORPHINE SULFATE (PF) 4 MG/ML IV SOLN
4.0000 mg | Freq: Once | INTRAVENOUS | Status: AC
  Administered 2017-04-10: 4 mg via INTRAVENOUS
  Filled 2017-04-10: qty 1

## 2017-04-10 MED ORDER — ONDANSETRON HCL 4 MG/2ML IJ SOLN
4.0000 mg | Freq: Once | INTRAMUSCULAR | Status: AC
  Administered 2017-04-10: 4 mg via INTRAVENOUS
  Filled 2017-04-10: qty 2

## 2017-04-10 MED ORDER — IOPAMIDOL (ISOVUE-300) INJECTION 61%
INTRAVENOUS | Status: AC
Start: 2017-04-10 — End: 2017-04-10
  Administered 2017-04-10: 100 mL
  Filled 2017-04-10: qty 100

## 2017-04-10 MED ORDER — GI COCKTAIL ~~LOC~~
30.0000 mL | Freq: Once | ORAL | Status: AC
  Administered 2017-04-10: 30 mL via ORAL
  Filled 2017-04-10: qty 30

## 2017-04-10 NOTE — ED Provider Notes (Signed)
[facility] DEPT Provider Note   CSN: [id number] Arrival date & time: 04/10/17  3662     History   Chief Complaint Chief Complaint  Patient presents with  . Abdominal Pain    HPI Debbie Santana is a 26 y.o. female.  HPI   26 year old female with history of kidney stone, chronic back pain presenting with abdominal pain. Patient report progressive worsening upper abdominal pain ongoing for the past 2 months. Pain is worse after eating especially greasy food. Pain is associated with nausea vomiting and some loose stools. Pain radiates to the back. She reported having decreased appetite, afraid to eat due to the pain. She is having weight loss, lack of eating. She endorse occasional chills. Symptom has progressively worse. She has not been evaluated for this. She still has intact gallbladder and appendix. She denies history of diabetes, or alcohol abuse. She is a smoker. Denies drug use. She does not have a primary care provider. Her pain felt different from prior kidney stone.  Past Medical History:  Diagnosis Date  . Chronic back pain   . Sciatica     Patient Active Problem List   Diagnosis Date Noted  . Right hand fracture 10/06/2013  . Fracture of metacarpal shaft of right hand, closed 10/06/2013  . Spontaneous abortion in first trimester 04/21/2013    Past Surgical History:  Procedure Laterality Date  . BACK SURGERY    . HERNIA REPAIR      OB History    Gravida Para Term Preterm AB Living   1       1     SAB TAB Ectopic Multiple Live Births   1               Home Medications    Prior to Admission medications   Medication Sig Start Date End Date Taking? Authorizing Provider  acetaminophen (TYLENOL) 500 MG tablet Take 500 mg by mouth every 6 (six) hours as needed for mild pain or moderate pain.    [provider]  HYDROcodone-acetaminophen (NORCO/VICODIN) 5-325 MG tablet Take one-two tabs po q 4-6 hrs prn pain 12/10/16   Triplett, Tammy, PA-C    ondansetron (ZOFRAN ODT) 4 MG disintegrating tablet Take 1 tablet (4 mg total) by mouth every 8 (eight) hours as needed for nausea. 10/25/16   Noemi Chapel, MD  ranitidine (ZANTAC) 150 MG tablet Take 300 mg by mouth daily.    [provider]    Family History History reviewed. No pertinent family history.  Social History Social History  Substance Use Topics  . Smoking status: Current Every Day Smoker    Packs/day: 1.00    Types: Cigarettes  . Smokeless tobacco: Never Used     Comment: never done snuff or chewing tobacco.  . Alcohol use No     Comment: occasionally     Allergies   Patient has no known allergies.   Review of Systems Review of Systems  All other systems reviewed and are negative.    Physical Exam Updated Vital Signs BP 110/87 (BP Location: Left Arm)   Pulse (!) 112   Temp 98.4 F (36.9 C) (Oral)   Resp (!) 22   Ht 5\' 7"  (1.702 m)   Wt 88.5 kg (195 lb)   SpO2 98%   BMI 30.54 kg/m   Physical Exam  Constitutional: She appears well-developed and well-nourished. No distress.  Obese female appears mildly uncomfortable but nontoxic  HENT:  Head: Atraumatic.  Eyes: Conjunctivae are normal.  Neck: Neck supple.  Cardiovascular:  Mild tachycardia without murmurs rubs gallops  Pulmonary/Chest: Effort normal and breath sounds normal.  Abdominal: Soft. Bowel sounds are normal. She exhibits no distension. There is tenderness (Tenderness to epigastric, and periumbilical region without guarding or rebound tenderness. Negative Murphy sign. No pain at McBurney's point.).  Musculoskeletal: She exhibits no edema.  Neurological: She is alert.  Skin: No rash noted.  Psychiatric: She has a normal mood and affect.  Nursing note and vitals reviewed.    ED Treatments / Results  Labs (all labs ordered are listed, but only abnormal results are displayed) Labs Reviewed  LIPASE, BLOOD - Abnormal; Notable for the following:       Result Value   Lipase 88  (*)    All other components within normal limits  COMPREHENSIVE METABOLIC PANEL - Abnormal; Notable for the following:    Glucose, Bld 111 (*)    All other components within normal limits  CBC - Abnormal; Notable for the following:    WBC 15.0 (*)    All other components within normal limits  URINALYSIS, ROUTINE W REFLEX MICROSCOPIC - Abnormal; Notable for the following:    Color, Urine AMBER (*)    APPearance HAZY (*)    Ketones, ur 5 (*)    Protein, ur 30 (*)    Bacteria, UA RARE (*)    Squamous Epithelial / LPF 0-5 (*)    All other components within normal limits  I-STAT BETA HCG BLOOD, ED ([facility], [facility], [facility] ONLY)    EKG  EKG Interpretation None       Radiology Ct Abdomen Pelvis W Contrast  Result Date: 04/10/2017 CLINICAL DATA:  Nausea and vomiting for several months EXAM: CT ABDOMEN AND PELVIS WITH CONTRAST TECHNIQUE: Multidetector CT imaging of the abdomen and pelvis was performed using the standard protocol following bolus administration of intravenous contrast. CONTRAST:  154mL ISOVUE-300 IOPAMIDOL (ISOVUE-300) INJECTION 61% COMPARISON:  Ultrasound from earlier in the same day. FINDINGS: Lower chest: No acute abnormality. Hepatobiliary: No focal liver abnormality is seen. No gallstones, gallbladder wall thickening, or biliary dilatation. Pancreas: Unremarkable. No pancreatic ductal dilatation or surrounding inflammatory changes. Spleen: Normal in size without focal abnormality. Adrenals/Urinary Tract: Adrenal glands are unremarkable. Kidneys are normal, without renal calculi, focal lesion, or hydronephrosis. Bladder is decompressed. Stomach/Bowel: Stomach is within normal limits. Appendix appears normal. No evidence of bowel wall thickening, distention, or inflammatory changes. Vascular/Lymphatic: No significant vascular findings are present. No enlarged abdominal or pelvic lymph nodes. Reproductive: Uterus and bilateral adnexa are unremarkable. Other: No abdominal wall hernia or  abnormality. No abdominopelvic ascites. Musculoskeletal: No acute or significant osseous findings. IMPRESSION: No acute abnormality noted. Electronically Signed   By: Inez Catalina M.D.   On: 04/10/2017 12:35   US Abdomen Limited  Result Date: 04/10/2017 CLINICAL DATA:  Right upper quadrant pain with nausea and vomiting for 3 days. EXAM: ULTRASOUND ABDOMEN LIMITED RIGHT UPPER QUADRANT COMPARISON:  CT chest, abdomen and pelvis 09/09/2016. FINDINGS: Gallbladder: No gallstones or wall thickening visualized. No sonographic Murphy sign noted by sonographer. Common bile duct: Diameter: 0.3 cm. Liver: No focal lesion identified. Within normal limits in parenchymal echogenicity. IMPRESSION: Negative exam. Electronically Signed   By: Inge Rise M.D.   On: 04/10/2017 10:48    Procedures Procedures (including critical care time)  Medications Ordered in ED Medications  ondansetron (ZOFRAN-ODT) disintegrating tablet 4 mg (4 mg Oral Given 04/10/17 0728)  morphine 4 MG/ML injection 4 mg (4 mg Intravenous Given 04/10/17  1610)  sodium chloride 0.9 % bolus 1,000 mL (0 mLs Intravenous Stopped 04/10/17 1108)  ondansetron (ZOFRAN) injection 4 mg (4 mg Intravenous Given 04/10/17 0930)  gi cocktail (Maalox,Lidocaine,Donnatal) (30 mLs Oral Given 04/10/17 1109)  promethazine (PHENERGAN) injection 12.5 mg (12.5 mg Intravenous Given 04/10/17 1120)  iopamidol (ISOVUE-300) 61 % injection (100 mLs  Contrast Given 04/10/17 1221)     Initial Impression / Assessment and Plan / ED Course  I have reviewed the triage vital signs and the nursing notes.  Pertinent labs & imaging results that were available during my care of the patient were reviewed by me and considered in my medical decision making (see chart for details).     BP 108/70   Pulse (!) 52   Temp 98.4 F (36.9 C) (Oral)   Resp 16   Ht 5\' 7"  (1.702 m)   Wt 88.5 kg (195 lb)   SpO2 96%   BMI 30.54 kg/m    Final Clinical Impressions(s) / ED Diagnoses    Final diagnoses:  Upper abdominal pain  Nausea vomiting and diarrhea    New Prescriptions New Prescriptions   PROMETHAZINE (PHENERGAN) 25 MG TABLET    Take 1 tablet (25 mg total) by mouth every 6 (six) hours as needed for nausea.   9:08 AM Patient here with upper abdominal pain,  along with postprandial pain and associate nausea and vomiting. The symptom is suggestive of biliary disease.  workup initiated.  1:16 PM Pregnancy test is negative. Urine shows no signs of urine tract infection. Mild elevated lipase of 88. Electrolyte panel are reassuring. Elevated white count of 15 however better than her baseline. A limited abdominal ultrasound performed showing no evidence of gallbladder etiology. I follow up with an abdominal and pelvis CT scan without any acute finding.  I discussed these results with patient. Patient does admits to using marijuana to help her with her symptoms. I discussed the possibility of cannabinoids hyperemesis syndrome causing her symptoms. I encouraged patient to stop using marijuana. I also encouraged patient to follow-up with gastroenterologist for further evaluation. Return precaution discussed. Encouraged patient to take PPI and H2 blocker 30 minutes before each major meal.   Domenic Moras, PA-C 04/10/17 [place], Ankit, MD 04/10/17 1601

## 2017-04-10 NOTE — ED Triage Notes (Signed)
Pt here for upper abd pain with N/V x several months worse over last 3 days

## 2017-04-10 NOTE — Discharge Instructions (Signed)
Please avoid using marijuana as it can cause or worsen your condition (cannabinoid hyperemesis syndrome).  Take prilosec and zantac 30 minutes before each major meal.  Take phenergan as needed for nausea.  Follow up with [facility] for further care of your condition.

## 2017-04-10 NOTE — ED Notes (Signed)
Patient transported to CT 

## 2017-04-10 NOTE — ED Notes (Signed)
Pt ambulated to room from waiting area, pt refused wheelchair. Pt assisted into gown and onto bp and o2 monitor.

## 2017-04-11 ENCOUNTER — Inpatient Hospital Stay (HOSPITAL_COMMUNITY)
Admission: EM | Admit: 2017-04-11 | Discharge: 2017-04-18 | DRG: 419 | Disposition: A | Discharge status: Home / Self Care | Payer: Self-pay | Attending: General Surgery | Admitting: General Surgery

## 2017-04-11 ENCOUNTER — Encounter (HOSPITAL_COMMUNITY): Payer: Self-pay | Admitting: Emergency Medicine

## 2017-04-11 DIAGNOSIS — F32A Depression, unspecified: Secondary | ICD-10-CM | POA: Diagnosis present

## 2017-04-11 DIAGNOSIS — A084 Viral intestinal infection, unspecified: Secondary | ICD-10-CM

## 2017-04-11 DIAGNOSIS — E876 Hypokalemia: Secondary | ICD-10-CM | POA: Diagnosis not present

## 2017-04-11 DIAGNOSIS — R109 Unspecified abdominal pain: Secondary | ICD-10-CM | POA: Diagnosis present

## 2017-04-11 DIAGNOSIS — F1290 Cannabis use, unspecified, uncomplicated: Secondary | ICD-10-CM | POA: Diagnosis present

## 2017-04-11 DIAGNOSIS — R1010 Upper abdominal pain, unspecified: Secondary | ICD-10-CM

## 2017-04-11 DIAGNOSIS — Z720 Tobacco use: Secondary | ICD-10-CM | POA: Diagnosis present

## 2017-04-11 DIAGNOSIS — K2900 Acute gastritis without bleeding: Secondary | ICD-10-CM | POA: Diagnosis present

## 2017-04-11 DIAGNOSIS — R112 Nausea with vomiting, unspecified: Secondary | ICD-10-CM

## 2017-04-11 DIAGNOSIS — K319 Disease of stomach and duodenum, unspecified: Secondary | ICD-10-CM | POA: Diagnosis present

## 2017-04-11 DIAGNOSIS — F17210 Nicotine dependence, cigarettes, uncomplicated: Secondary | ICD-10-CM | POA: Diagnosis present

## 2017-04-11 DIAGNOSIS — K811 Chronic cholecystitis: Principal | ICD-10-CM | POA: Diagnosis present

## 2017-04-11 DIAGNOSIS — K2100 Gastro-esophageal reflux disease with esophagitis, without bleeding: Secondary | ICD-10-CM | POA: Diagnosis present

## 2017-04-11 DIAGNOSIS — R001 Bradycardia, unspecified: Secondary | ICD-10-CM | POA: Diagnosis present

## 2017-04-11 DIAGNOSIS — K2950 Unspecified chronic gastritis without bleeding: Secondary | ICD-10-CM | POA: Diagnosis present

## 2017-04-11 DIAGNOSIS — Z8711 Personal history of peptic ulcer disease: Secondary | ICD-10-CM

## 2017-04-11 DIAGNOSIS — D72829 Elevated white blood cell count, unspecified: Secondary | ICD-10-CM | POA: Diagnosis present

## 2017-04-11 DIAGNOSIS — K279 Peptic ulcer, site unspecified, unspecified as acute or chronic, without hemorrhage or perforation: Secondary | ICD-10-CM | POA: Diagnosis present

## 2017-04-11 DIAGNOSIS — K5900 Constipation, unspecified: Secondary | ICD-10-CM | POA: Diagnosis not present

## 2017-04-11 DIAGNOSIS — E669 Obesity, unspecified: Secondary | ICD-10-CM | POA: Diagnosis present

## 2017-04-11 DIAGNOSIS — R1011 Right upper quadrant pain: Secondary | ICD-10-CM

## 2017-04-11 DIAGNOSIS — K2960 Other gastritis without bleeding: Secondary | ICD-10-CM | POA: Diagnosis present

## 2017-04-11 DIAGNOSIS — R197 Diarrhea, unspecified: Secondary | ICD-10-CM | POA: Diagnosis present

## 2017-04-11 DIAGNOSIS — Z6830 Body mass index (BMI) 30.0-30.9, adult: Secondary | ICD-10-CM

## 2017-04-11 DIAGNOSIS — R739 Hyperglycemia, unspecified: Secondary | ICD-10-CM | POA: Diagnosis present

## 2017-04-11 DIAGNOSIS — F419 Anxiety disorder, unspecified: Secondary | ICD-10-CM | POA: Diagnosis present

## 2017-04-11 HISTORY — DX: Unspecified abdominal pain: R10.9

## 2017-04-11 HISTORY — DX: Peptic ulcer, site unspecified, unspecified as acute or chronic, without hemorrhage or perforation: K27.9

## 2017-04-11 HISTORY — DX: Bradycardia, unspecified: R00.1

## 2017-04-11 HISTORY — DX: Gastric ulcer, unspecified as acute or chronic, without hemorrhage or perforation: K25.9

## 2017-04-11 HISTORY — DX: Other gastritis without bleeding: K29.60

## 2017-04-11 LAB — COMPREHENSIVE METABOLIC PANEL
ALK PHOS: 60 U/L (ref 38–126)
ALT: 14 U/L (ref 14–54)
AST: 20 U/L (ref 15–41)
Albumin: 4 g/dL (ref 3.5–5.0)
Anion gap: 11 (ref 5–15)
BUN: 7 mg/dL (ref 6–20)
CALCIUM: 9.3 mg/dL (ref 8.9–10.3)
CHLORIDE: 109 mmol/L (ref 101–111)
CO2: 20 mmol/L — AB (ref 22–32)
CREATININE: 0.95 mg/dL (ref 0.44–1.00)
GFR calc non Af Amer: >60 mL/min (ref >60)
Glucose, Bld: 156 mg/dL — ABNORMAL HIGH (ref 65–99)
Potassium: 3.4 mmol/L — ABNORMAL LOW (ref 3.5–5.1)
SODIUM: 140 mmol/L (ref 135–145)
Total Bilirubin: 1.1 mg/dL (ref 0.3–1.2)
Total Protein: 6.8 g/dL (ref 6.5–8.1)

## 2017-04-11 LAB — CBC WITH DIFFERENTIAL/PLATELET
BASOS ABS: 0 10*3/uL (ref 0.0–0.1)
Basophils Relative: 0 %
Eosinophils Absolute: 0 10*3/uL (ref 0.0–0.7)
Eosinophils Relative: 0 %
HCT: 41.7 % (ref 36.0–46.0)
HEMOGLOBIN: 14.4 g/dL (ref 12.0–15.0)
LYMPHS ABS: 1.8 10*3/uL (ref 0.7–4.0)
LYMPHS PCT: 10 %
MCH: 31.5 pg (ref 26.0–34.0)
MCHC: 34.5 g/dL (ref 30.0–36.0)
MCV: 91.2 fL (ref 78.0–100.0)
Monocytes Absolute: 0.9 10*3/uL (ref 0.1–1.0)
Monocytes Relative: 5 %
NEUTROS PCT: 85 %
Neutro Abs: 14.7 10*3/uL — ABNORMAL HIGH (ref 1.7–7.7)
Platelets: 260 10*3/uL (ref 150–400)
RBC: 4.57 MIL/uL (ref 3.87–5.11)
RDW: 12.9 % (ref 11.5–15.5)
WBC: 17.5 10*3/uL — AB (ref 4.0–10.5)

## 2017-04-11 LAB — LIPASE, BLOOD: Lipase: 19 U/L (ref 11–51)

## 2017-04-11 LAB — MAGNESIUM: MAGNESIUM: 1.5 mg/dL — AB (ref 1.7–2.4)

## 2017-04-11 LAB — LACTIC ACID, PLASMA: LACTIC ACID, VENOUS: 1.4 mmol/L (ref 0.5–1.9)

## 2017-04-11 LAB — PHOSPHORUS: Phosphorus: 2.9 mg/dL (ref 2.5–4.6)

## 2017-04-11 MED ORDER — FAMOTIDINE IN NACL 20-0.9 MG/50ML-% IV SOLN
20.0000 mg | Freq: Two times a day (BID) | INTRAVENOUS | Status: DC
Start: 2017-04-11 — End: 2017-04-13
  Administered 2017-04-11 – 2017-04-13 (×4): 20 mg via INTRAVENOUS
  Filled 2017-04-11 (×4): qty 50

## 2017-04-11 MED ORDER — HYDROMORPHONE HCL 1 MG/ML IJ SOLN: 0.5000 mg | Freq: Once | INTRAMUSCULAR | Status: DC

## 2017-04-11 MED ORDER — ENOXAPARIN SODIUM 40 MG/0.4ML ~~LOC~~ SOLN
40.0000 mg | SUBCUTANEOUS | Status: DC
  Administered 2017-04-11 – 2017-04-12 (×2): 40 mg via SUBCUTANEOUS
  Filled 2017-04-11 (×2): qty 0.4

## 2017-04-11 MED ORDER — SODIUM CHLORIDE 0.9 % IV BOLUS (SEPSIS)
1000.0000 mL | Freq: Once | INTRAVENOUS | Status: AC
  Administered 2017-04-11: 1000 mL via INTRAVENOUS

## 2017-04-11 MED ORDER — HYDROMORPHONE HCL 1 MG/ML IJ SOLN
0.5000 mg | INTRAMUSCULAR | Status: DC | PRN
  Administered 2017-04-11 – 2017-04-12 (×4): 1 mg via INTRAVENOUS
  Filled 2017-04-11 (×4): qty 1

## 2017-04-11 MED ORDER — ONDANSETRON HCL 4 MG/2ML IJ SOLN
4.0000 mg | Freq: Four times a day (QID) | INTRAMUSCULAR | Status: DC | PRN
  Administered 2017-04-11 – 2017-04-12 (×3): 4 mg via INTRAVENOUS
  Filled 2017-04-11 (×3): qty 2

## 2017-04-11 MED ORDER — SODIUM CHLORIDE 0.9 % IV BOLUS (SEPSIS)
2000.0000 mL | Freq: Once | INTRAVENOUS | Status: AC
  Administered 2017-04-11: 2000 mL via INTRAVENOUS

## 2017-04-11 MED ORDER — SODIUM CHLORIDE 0.9 % IV SOLN
INTRAVENOUS | Status: DC
  Administered 2017-04-11 – 2017-04-12 (×2): via INTRAVENOUS

## 2017-04-11 MED ORDER — ONDANSETRON HCL 4 MG/2ML IJ SOLN
4.0000 mg | Freq: Once | INTRAMUSCULAR | Status: AC
  Administered 2017-04-11: 4 mg via INTRAVENOUS
  Filled 2017-04-11: qty 2

## 2017-04-11 MED ORDER — PANTOPRAZOLE SODIUM 40 MG IV SOLR
40.0000 mg | Freq: Once | INTRAVENOUS | Status: AC
  Administered 2017-04-11: 40 mg via INTRAVENOUS
  Filled 2017-04-11: qty 40

## 2017-04-11 MED ORDER — BOOST / RESOURCE BREEZE PO LIQD
1.0000 | Freq: Three times a day (TID) | ORAL | Status: DC
  Administered 2017-04-11 – 2017-04-12 (×2): 1 via ORAL

## 2017-04-11 MED ORDER — SUCRALFATE 1 GM/10ML PO SUSP
1.0000 g | Freq: Three times a day (TID) | ORAL | Status: DC
  Administered 2017-04-12 (×3): 1 g via ORAL
  Filled 2017-04-11 (×6): qty 10

## 2017-04-11 MED ORDER — LORAZEPAM 2 MG/ML IJ SOLN
1.0000 mg | Freq: Four times a day (QID) | INTRAMUSCULAR | Status: DC | PRN
  Administered 2017-04-11: 1 mg via INTRAVENOUS
  Filled 2017-04-11: qty 1

## 2017-04-11 MED ORDER — METOCLOPRAMIDE HCL 5 MG/ML IJ SOLN
10.0000 mg | Freq: Once | INTRAMUSCULAR | Status: AC
  Administered 2017-04-11: 10 mg via INTRAVENOUS
  Filled 2017-04-11: qty 2

## 2017-04-11 MED ORDER — METOCLOPRAMIDE HCL 5 MG/ML IJ SOLN
10.0000 mg | Freq: Three times a day (TID) | INTRAMUSCULAR | Status: DC
  Administered 2017-04-11 – 2017-04-17 (×17): 10 mg via INTRAVENOUS
  Filled 2017-04-11 (×17): qty 2

## 2017-04-11 MED ORDER — SUCRALFATE 1 GM/10ML PO SUSP
1.0000 g | Freq: Once | ORAL | Status: AC
  Administered 2017-04-11: 1 g via ORAL
  Filled 2017-04-11: qty 10

## 2017-04-11 MED ORDER — LORAZEPAM 2 MG/ML IJ SOLN
1.0000 mg | Freq: Once | INTRAMUSCULAR | Status: AC
  Administered 2017-04-11: 1 mg via INTRAVENOUS
  Filled 2017-04-11: qty 1

## 2017-04-11 MED ORDER — PROMETHAZINE HCL 25 MG/ML IJ SOLN
12.5000 mg | Freq: Four times a day (QID) | INTRAMUSCULAR | Status: DC | PRN
  Administered 2017-04-11 – 2017-04-14 (×9): 12.5 mg via INTRAVENOUS
  Filled 2017-04-11 (×9): qty 1

## 2017-04-11 NOTE — H&P (Signed)
History and Physical    MARYCATHERINE MANISCALCO NIO:[id number] DOB: Jun 18, 1991 DOA: 04/11/2017  PCP: Patient, No Pcp Per Patient coming from: home  Chief Complaint: n/v/d abd pain  HPI: MARDA BREIDENBACH is a 26 y.o. female with medical history significant of chronic back pain, gastric ulcers. Patient reporting 2 day history of intermittent abdominal pain associated with nausea, vomiting and diarrhea. Pain is now constant and has been such for the last several hours. Patient unable to tolerate any oral intake. Diarrhea is just loose stool. Emesis scratches nonbloody. Of note patient was seen one day prior to admission in the ED and had extensive workup that was unremarkable including normal ultrasound, normal CT, normal UA and essentially normal chemistries. Patient states that her pain is in the epigastric region with radiation to her back. Endorses a sensation of   feeling hot but denies fevers. Associated with chills and tremor. Denies any sick contacts. Denies eating any spoiled foods. Denies any recent travel. Symptoms are getting worse. Denies dysuria, frequency, chest pain, shortness breath, palpitations, neck stiffness, focal neurological deficit.  ED Course: Patient received multiple rounds of Zofran, Dilaudid, Ativan, IV fluids, and Zofran with only minimal improvement in symptoms. Continues to have emesis.  Review of Systems: As per HPI otherwise all other systems reviewed and are negative  Ambulatory Status: and restrictions  Past Medical History:  Diagnosis Date  . Chronic back pain   . Gastric ulcer   . Sciatica     Past Surgical History:  Procedure Laterality Date  . BACK SURGERY    . HERNIA REPAIR      Social History   Social History  . Marital status: Single    Spouse name: N/A  . Number of children: N/A  . Years of education: N/A   Occupational History  . Not on file.   Social History Main Topics  . Smoking status: Current Every Day Smoker    Packs/day: 1.00   Types: Cigarettes  . Smokeless tobacco: Never Used     Comment: never done snuff or chewing tobacco.  . Alcohol use No     Comment: occasionally  . Drug use: Yes    Types: Marijuana     Comment: 04/11/17  . Sexual activity: Yes    Birth control/ protection: Pill   Other Topics Concern  . Not on file   Social History Narrative  . No narrative on file    No Known Allergies  Family History  Problem Relation Age of Onset  . Anxiety disorder Mother       Prior to Admission medications   Medication Sig Start Date End Date Taking? Authorizing Provider  dimenhyDRINATE (DRAMAMINE) 50 MG tablet Take 50 mg by mouth every morning.   Yes [provider]  promethazine (PHENERGAN) 25 MG tablet Take 1 tablet (25 mg total) by mouth every 6 (six) hours as needed for nausea. 04/10/17  Yes Domenic Moras, PA-C  ranitidine (ZANTAC) 150 MG tablet Take 300 mg by mouth daily.   Yes [provider]    Physical Exam: Vitals:   04/11/17 1430 04/11/17 1530 04/11/17 1700 04/11/17 1730  BP: 125/70 135/75 106/68 (!) 150/73  Pulse: (!) 45 (!) 45 (!) 30 (!) 41  Resp: 14 14 (!) 22 16  SpO2: 100% 100% 99% 100%  Weight:      Height:         General: Appears to be in distress, sitting up in bed. Eyes:  PERRL, EOMI, normal lids, iris  ENT:  grossly normal hearing, lips & tongue, mmm Neck:  no LAD, masses or thyromegaly Cardiovascular:  RRR, no m/r/g. No LE edema.  Respiratory:  CTA bilaterally, no w/r/r. Normal respiratory effort. Abdomen:  Soft, nondistended, some guarding throughout negative for Murphy sign, no tenderness at McBurney's point. Hypoactive bowel sounds. Skin:  no rash or induration seen on limited exam Musculoskeletal:  grossly normal tone BUE/BLE, good ROM, no bony abnormality Psychiatric:  grossly normal mood and affect, speech fluent and appropriate, AOx3 Neurologic:  CN 2-12 grossly intact, moves all extremities in coordinated fashion, sensation intact  Labs on  Admission: I have personally reviewed following labs and imaging studies  CBC:  Recent Labs Lab 04/10/17 0726 04/11/17 1310  WBC 15.0* 17.5*  NEUTROABS  --  14.7*  HGB 14.8 14.4  HCT 42.5 41.7  MCV 90.8 91.2  PLT 265 510   Basic Metabolic Panel:  Recent Labs Lab 04/10/17 0726 04/11/17 1310  NA 139 140  K 3.5 3.4*  CL 107 109  CO2 22 20*  GLUCOSE 111* 156*  BUN 6 7  CREATININE 0.99 0.95  CALCIUM 9.6 9.3   GFR: Estimated Creatinine Clearance: 102.6 mL/min (by C-G formula based on SCr of 0.95 mg/dL). Liver Function Tests:  Recent Labs Lab 04/10/17 0726 04/11/17 1310  AST 18 20  ALT 14 14  ALKPHOS 63 60  BILITOT 0.8 1.1  PROT 6.7 6.8  ALBUMIN 4.2 4.0    Recent Labs Lab 04/10/17 0726 04/11/17 1310  LIPASE 88* 19   No results for input(s): AMMONIA in the last 168 hours. Coagulation Profile: No results for input(s): INR, PROTIME in the last 168 hours. Cardiac Enzymes: No results for input(s): CKTOTAL, CKMB, CKMBINDEX, TROPONINI in the last 168 hours. BNP (last 3 results) No results for input(s): PROBNP in the last 8760 hours. HbA1C: No results for input(s): HGBA1C in the last 72 hours. CBG: No results for input(s): GLUCAP in the last 168 hours. Lipid Profile: No results for input(s): CHOL, HDL, LDLCALC, TRIG, CHOLHDL, LDLDIRECT in the last 72 hours. Thyroid Function Tests: No results for input(s): TSH, T4TOTAL, FREET4, T3FREE, THYROIDAB in the last 72 hours. Anemia Panel: No results for input(s): VITAMINB12, FOLATE, FERRITIN, TIBC, IRON, RETICCTPCT in the last 72 hours. Urine analysis:    Component Value Date/Time   COLORURINE AMBER (A) 04/10/2017 0736   APPEARANCEUR HAZY (A) 04/10/2017 0736   LABSPEC 1.025 04/10/2017 0736   PHURINE 7.0 04/10/2017 0736   GLUCOSEU NEGATIVE 04/10/2017 0736   HGBUR NEGATIVE 04/10/2017 0736   BILIRUBINUR NEGATIVE 04/10/2017 0736   KETONESUR 5 (A) 04/10/2017 0736   PROTEINUR 30 (A) 04/10/2017 0736   UROBILINOGEN  0.2 05/14/2015 0028   NITRITE NEGATIVE 04/10/2017 0736   LEUKOCYTESUR NEGATIVE 04/10/2017 0736    Creatinine Clearance: Estimated Creatinine Clearance: 102.6 mL/min (by C-G formula based on SCr of 0.95 mg/dL).  Sepsis Labs: @LABRCNTIP (procalcitonin:4,lacticidven:4) )No results found for this or any previous visit (from the past 240 hour(s)).   Radiological Exams on Admission: Ct Abdomen Pelvis W Contrast  Result Date: 04/10/2017 CLINICAL DATA:  Nausea and vomiting for several months EXAM: CT ABDOMEN AND PELVIS WITH CONTRAST TECHNIQUE: Multidetector CT imaging of the abdomen and pelvis was performed using the standard protocol following bolus administration of intravenous contrast. CONTRAST:  163mL ISOVUE-300 IOPAMIDOL (ISOVUE-300) INJECTION 61% COMPARISON:  Ultrasound from earlier in the same day. FINDINGS: Lower chest: No acute abnormality. Hepatobiliary: No focal liver abnormality is seen. No gallstones, gallbladder wall thickening, or biliary dilatation.  Pancreas: Unremarkable. No pancreatic ductal dilatation or surrounding inflammatory changes. Spleen: Normal in size without focal abnormality. Adrenals/Urinary Tract: Adrenal glands are unremarkable. Kidneys are normal, without renal calculi, focal lesion, or hydronephrosis. Bladder is decompressed. Stomach/Bowel: Stomach is within normal limits. Appendix appears normal. No evidence of bowel wall thickening, distention, or inflammatory changes. Vascular/Lymphatic: No significant vascular findings are present. No enlarged abdominal or pelvic lymph nodes. Reproductive: Uterus and bilateral adnexa are unremarkable. Other: No abdominal wall hernia or abnormality. No abdominopelvic ascites. Musculoskeletal: No acute or significant osseous findings. IMPRESSION: No acute abnormality noted. Electronically Signed   By: Inez Catalina M.D.   On: 04/10/2017 12:35   US Abdomen Limited  Result Date: 04/10/2017 CLINICAL DATA:  Right upper quadrant pain with  nausea and vomiting for 3 days. EXAM: ULTRASOUND ABDOMEN LIMITED RIGHT UPPER QUADRANT COMPARISON:  CT chest, abdomen and pelvis 09/09/2016. FINDINGS: Gallbladder: No gallstones or wall thickening visualized. No sonographic Murphy sign noted by sonographer. Common bile duct: Diameter: 0.3 cm. Liver: No focal lesion identified. Within normal limits in parenchymal echogenicity. IMPRESSION: Negative exam. Electronically Signed   By: Inge Rise M.D.   On: 04/10/2017 10:48    EKG: Independently reviewed.   Assessment/Plan Active Problems:   Viral gastroenteritis   Intractable abdominal pain   Leukocytosis   Hyperglycemia   Anxiety   Intractable nausea, vomiting, abdominal pain, diarrhea: Likely secondary to viral gastroenteritis possibility of worsening secondary to [facility] use. Patient had extensive workup in the ED on the day prior to admission including normal ultrasound, normal CT, unremarkable urinalysis. Chemistries show an improvement in lipase from 88 down to 19 on day of admission. WBC continues to climb from 15-17.5. No sick contacts. Negative pregnancy test. He also be in part due to severe PUD as patient has history of this in the past with intermittent epigastric pain. - Aggressive IVF - Reglan, Zofran - Stool pathogen panel, stool lactoferrin - Oxycodone, Dilaudid when necessary pain - IV Pepcid, carafate  Anxiety: - Valium PRN  Hyperglycemia: no h/o DM. Likely stress related - A1c  DVT prophylaxis: Lovenox  Code Status: full  Family Communication: mother and fiance  Disposition Plan: pendingi mprovement  Consults called: none  Admission status: obs    Genora Arp J MD [facility]  If 7PM-7AM, please contact night-coverage [email or web address] Password [email or web address]  04/11/2017, 7:08 PM

## 2017-04-11 NOTE — ED Provider Notes (Signed)
[place] DEPT Provider Note   CSN: [id number] Arrival date & time: 04/11/17  1135     History   Chief Complaint Chief Complaint  Patient presents with  . Abdominal Pain    HPI Debbie Santana is a 26 y.o. female.  HPI Patient presents with 3 months of episodic upper abdominal pain that is worsening over the last 3 days. Is associated with eating in the past. Patient states pain radiates from the central upper abdomen all the way to her back. She's had nausea and multiple episodes of vomiting. Also states the vomit has been bright yellow in color. She's had loose stools. Was seen in the emergency department yesterday and had full workup including CT abdomen pelvis and limited ultrasound of the gallbladder. Both were normal. She did have elevation in her lipase and white blood cell count. The patient states that the pain worsened this morning and has been persistent. She's been diaphoretic and quite anxious. No previous abdominal surgeries. Has not been in to see a gastroenterologist due to lack of insurance. Admits to smoking marijuana this morning. Denies any other drug or alcohol use. Past Medical History:  Diagnosis Date  . Chronic back pain   . Gastric ulcer   . Sciatica     Patient Active Problem List   Diagnosis Date Noted  . Viral gastroenteritis 04/11/2017  . Intractable abdominal pain 04/11/2017  . Leukocytosis 04/11/2017  . Hyperglycemia 04/11/2017  . Anxiety 04/11/2017  . Nausea and vomiting   . Right hand fracture 10/06/2013  . Fracture of metacarpal shaft of right hand, closed 10/06/2013  . Spontaneous abortion in first trimester 04/21/2013    Past Surgical History:  Procedure Laterality Date  . BACK SURGERY    . HERNIA REPAIR      OB History    Gravida Para Term Preterm AB Living   1       1     SAB TAB Ectopic Multiple Live Births   1               Home Medications    Prior to Admission medications   Medication Sig Start Date End Date  Taking? Authorizing Provider  dimenhyDRINATE (DRAMAMINE) 50 MG tablet Take 50 mg by mouth every morning.   Yes [provider]  promethazine (PHENERGAN) 25 MG tablet Take 1 tablet (25 mg total) by mouth every 6 (six) hours as needed for nausea. 04/10/17  Yes Domenic Moras, PA-C  ranitidine (ZANTAC) 150 MG tablet Take 300 mg by mouth daily.   Yes [provider]    Family History Family History  Problem Relation Age of Onset  . Anxiety disorder Mother     Social History Social History  Substance Use Topics  . Smoking status: Current Every Day Smoker    Packs/day: 1.00    Types: Cigarettes  . Smokeless tobacco: Never Used     Comment: never done snuff or chewing tobacco.  . Alcohol use No     Comment: occasionally     Allergies   Patient has no known allergies.   Review of Systems Review of Systems  Constitutional: Positive for appetite change, chills and diaphoresis. Negative for fever.  Respiratory: Negative for cough and shortness of breath.   Cardiovascular: Negative for chest pain and palpitations.  Gastrointestinal: Positive for abdominal pain, diarrhea, nausea and vomiting. Negative for constipation.  Genitourinary: Negative for dysuria, flank pain and pelvic pain.  Musculoskeletal: Positive for back pain. Negative for myalgias, neck  pain and neck stiffness.  Skin: Negative for rash and wound.  Neurological: Positive for dizziness and numbness. Negative for syncope, weakness and headaches.  Psychiatric/Behavioral: The patient is nervous/anxious.   All other systems reviewed and are negative.    Physical Exam Updated Vital Signs BP 116/65 (BP Location: Left Arm)   Pulse 86   Temp 99 F (37.2 C) (Oral)   Resp 16   Ht 5\' 7"  (1.702 m)   Wt 88.5 kg (195 lb)   LMP 03/31/2017   SpO2 98%   BMI 30.54 kg/m   Physical Exam  Constitutional: She is oriented to person, place, and time. She appears well-developed and well-nourished. She appears  distressed.  HENT:  Head: Normocephalic and atraumatic.  Mouth/Throat: Oropharynx is clear and moist.  Eyes: Pupils are equal, round, and reactive to light. EOM are normal.  Neck: Normal range of motion. Neck supple.  Cardiovascular: Normal rate and regular rhythm.  Exam reveals no gallop and no friction rub.   No murmur heard. Pulmonary/Chest: Effort normal and breath sounds normal. No respiratory distress. She has no wheezes. She has no rales. She exhibits no tenderness.  Abdominal: Soft. Bowel sounds are normal. She exhibits no distension. There is tenderness (epigastric tenderness with palpation.). There is no rebound and no guarding.  Musculoskeletal: Normal range of motion. She exhibits no edema or tenderness.  No CVA tenderness. No midline thoracic or lumbar tenderness.  Neurological: She is alert and oriented to person, place, and time.  Moving all extremities without focal deficit. Sensation intact.  Skin: Skin is warm. Capillary refill takes less than 2 seconds. No rash noted. She is diaphoretic. No erythema.  Psychiatric:  Patient is writhing in bed. Very anxious.  Nursing note and vitals reviewed.    ED Treatments / Results  Labs (all labs ordered are listed, but only abnormal results are displayed) Labs Reviewed  CBC WITH DIFFERENTIAL/PLATELET - Abnormal; Notable for the following:       Result Value   WBC 17.5 (*)    Neutro Abs 14.7 (*)    All other components within normal limits  COMPREHENSIVE METABOLIC PANEL - Abnormal; Notable for the following:    Potassium 3.4 (*)    CO2 20 (*)    Glucose, Bld 156 (*)    All other components within normal limits  MAGNESIUM - Abnormal; Notable for the following:    Magnesium 1.5 (*)    All other components within normal limits  CBC - Abnormal; Notable for the following:    WBC 15.5 (*)    All other components within normal limits  BASIC METABOLIC PANEL - Abnormal; Notable for the following:    BUN 5 (*)    All other  components within normal limits  GASTROINTESTINAL PANEL BY PCR, STOOL (REPLACES STOOL CULTURE)  LIPASE, BLOOD  LACTIC ACID, PLASMA  PHOSPHORUS  LACTOFERRIN, FECAL,QUALITATIVE  HEMOGLOBIN A1C  HIV ANTIBODY (ROUTINE TESTING)    EKG  EKG Interpretation  Date/Time:  Friday April 11 2017 11:38:59 EDT Ventricular Rate:  62 PR Interval:    QRS Duration: 78 QT Interval:  440 QTC Calculation: 447 R Axis:   76 Text Interpretation:  Sinus rhythm Atrial premature complex Confirmed by Lita Mains  MD, Niya Behler ([id number]) on 04/11/2017 1:16:08 PM       Radiology No results found.  Procedures Procedures (including critical care time)  Medications Ordered in ED Medications  HYDROmorphone (DILAUDID) injection 0.5 mg (0 mg Intravenous Hold 04/11/17 1410)  metoCLOPramide (REGLAN) injection 10  mg (10 mg Intravenous Given 04/12/17 1312)  sucralfate (CARAFATE) 1 GM/10ML suspension 1 g (1 g Oral Given 04/12/17 1454)  0.9 %  sodium chloride infusion ( Intravenous Rate/Dose Change 04/12/17 1238)  promethazine (PHENERGAN) injection 12.5 mg (12.5 mg Intravenous Given 04/12/17 1236)  famotidine (PEPCID) IVPB 20 mg premix (0 mg Intravenous Stopped 04/12/17 0942)  enoxaparin (LOVENOX) injection 40 mg (40 mg Subcutaneous Given 04/11/17 2208)  feeding supplement (BOOST / RESOURCE BREEZE) liquid 1 Container (1 Container Oral Not Given 04/12/17 1000)  alum & mag hydroxide-simeth (MAALOX/MYLANTA) 200-200-20 MG/5ML suspension 30 mL (30 mLs Oral Given 04/12/17 0202)  morphine 2 MG/ML injection 1 mg (1 mg Intravenous Given 04/12/17 1253)  ondansetron (ZOFRAN) injection 4 mg (not administered)  LORazepam (ATIVAN) injection 0.5 mg (0.5 mg Intravenous Given 04/12/17 1312)  LORazepam (ATIVAN) injection 1 mg (1 mg Intravenous Given 04/11/17 1254)  sodium chloride 0.9 % bolus 2,000 mL (0 mLs Intravenous Stopped 04/11/17 1451)  metoCLOPramide (REGLAN) injection 10 mg (10 mg Intravenous Given 04/11/17 1254)  pantoprazole (PROTONIX)  injection 40 mg (40 mg Intravenous Given 04/11/17 1254)  sucralfate (CARAFATE) 1 GM/10ML suspension 1 g (1 g Oral Given 04/11/17 1521)  ondansetron (ZOFRAN) injection 4 mg (4 mg Intravenous Given 04/11/17 1521)  sodium chloride 0.9 % bolus 1,000 mL (0 mLs Intravenous Stopped 04/11/17 1728)  ondansetron (ZOFRAN) injection 4 mg (4 mg Intravenous Given 04/11/17 1739)     Initial Impression / Assessment and Plan / ED Course  I have reviewed the triage vital signs and the nursing notes.  Pertinent labs & imaging results that were available during my care of the patient were reviewed by me and considered in my medical decision making (see chart for details).    Review of previous imaging shows that she had a HIDA scan done in 2008 with reduced ejection fraction of 26%. Normal CT scan of the abdomen and pelvis and abdominal ultrasound yesterday. Will repeat labs and treat symptomatically. Suspect possible pancreatitis versus peptic ulcer disease versus biliary dysfunction. Patient with persistent nausea and pain despite multiple doses of medication and IV fluids. Discussed with hospitalist who will see patient in the emergency department and admit. Final Clinical Impressions(s) / ED Diagnoses   Final diagnoses:  Pain of upper abdomen  Nausea and vomiting, intractability of vomiting not specified, unspecified vomiting type    New Prescriptions Current Discharge Medication List       Julianne Rice, MD 04/12/17 1456

## 2017-04-11 NOTE — ED Notes (Signed)
Holding Dilaudid, pt states abd pain has "ceased" MD [facility] notified.

## 2017-04-11 NOTE — ED Triage Notes (Signed)
Pt was seen at [facility] ED last night for same similar sx. Hx of gastric ulcers, v/ bile today with hot flashes. Was given Phenergan PO without relief. Pt is frantic wanting her back rubbed, stating "i'm having a stroke", "[name]". Pt is alert with no noted deficits.

## 2017-04-11 NOTE — ED Notes (Signed)
Pt given 10mg  Morphine and 4mg  Zofran IV PTA.

## 2017-04-12 LAB — CBC
HCT: 39.2 % (ref 36.0–46.0)
Hemoglobin: 13.2 g/dL (ref 12.0–15.0)
MCH: 31.4 pg (ref 26.0–34.0)
MCHC: 33.7 g/dL (ref 30.0–36.0)
MCV: 93.1 fL (ref 78.0–100.0)
PLATELETS: 221 10*3/uL (ref 150–400)
RBC: 4.21 MIL/uL (ref 3.87–5.11)
RDW: 13.2 % (ref 11.5–15.5)
WBC: 15.5 10*3/uL — AB (ref 4.0–10.5)

## 2017-04-12 LAB — BASIC METABOLIC PANEL
ANION GAP: 8 (ref 5–15)
BUN: 5 mg/dL — ABNORMAL LOW (ref 6–20)
CALCIUM: 9 mg/dL (ref 8.9–10.3)
CO2: 26 mmol/L (ref 22–32)
Chloride: 107 mmol/L (ref 101–111)
Creatinine, Ser: 0.72 mg/dL (ref 0.44–1.00)
GLUCOSE: 91 mg/dL (ref 65–99)
POTASSIUM: 3.5 mmol/L (ref 3.5–5.1)
Sodium: 141 mmol/L (ref 135–145)

## 2017-04-12 MED ORDER — ALUM & MAG HYDROXIDE-SIMETH 200-200-20 MG/5ML PO SUSP
30.0000 mL | ORAL | Status: DC | PRN
  Administered 2017-04-12 – 2017-04-17 (×2): 30 mL via ORAL
  Filled 2017-04-12 (×3): qty 30

## 2017-04-12 MED ORDER — HYDROMORPHONE HCL 1 MG/ML IJ SOLN
0.5000 mg | INTRAMUSCULAR | Status: DC | PRN
  Administered 2017-04-12 – 2017-04-17 (×19): 0.5 mg via INTRAVENOUS
  Filled 2017-04-12 (×21): qty 1

## 2017-04-12 MED ORDER — NICOTINE 21 MG/24HR TD PT24
21.0000 mg | MEDICATED_PATCH | Freq: Every day | TRANSDERMAL | Status: DC
  Administered 2017-04-12: 21 mg via TRANSDERMAL
  Filled 2017-04-12 (×2): qty 1

## 2017-04-12 MED ORDER — LORAZEPAM 2 MG/ML IJ SOLN
0.5000 mg | Freq: Four times a day (QID) | INTRAMUSCULAR | Status: DC | PRN
  Administered 2017-04-12 – 2017-04-15 (×9): 0.5 mg via INTRAVENOUS
  Filled 2017-04-12 (×11): qty 1

## 2017-04-12 MED ORDER — HYDROMORPHONE HCL 2 MG PO TABS
1.0000 mg | ORAL_TABLET | ORAL | Status: DC | PRN
  Filled 2017-04-12: qty 1

## 2017-04-12 MED ORDER — ONDANSETRON HCL 4 MG/2ML IJ SOLN
4.0000 mg | INTRAMUSCULAR | Status: DC | PRN
  Administered 2017-04-12 – 2017-04-17 (×12): 4 mg via INTRAVENOUS
  Filled 2017-04-12 (×13): qty 2

## 2017-04-12 MED ORDER — ACETAMINOPHEN 325 MG RE SUPP
325.0000 mg | RECTAL | Status: DC | PRN
  Filled 2017-04-12: qty 1

## 2017-04-12 MED ORDER — ACETAMINOPHEN 325 MG PO TABS: 650.0000 mg | ORAL_TABLET | Freq: Four times a day (QID) | ORAL | Status: DC | PRN

## 2017-04-12 MED ORDER — LORAZEPAM 1 MG PO TABS
1.0000 mg | ORAL_TABLET | ORAL | Status: DC | PRN
  Filled 2017-04-12: qty 1

## 2017-04-12 MED ORDER — MORPHINE SULFATE (PF) 2 MG/ML IV SOLN
1.0000 mg | INTRAVENOUS | Status: DC | PRN
  Administered 2017-04-12 (×2): 1 mg via INTRAVENOUS
  Filled 2017-04-12 (×2): qty 1

## 2017-04-12 NOTE — Progress Notes (Signed)
Patient and family have requested to see a GI MD and to talk about possibly having an endoscopy while patient is admitted. Notified hospitalist.

## 2017-04-12 NOTE — Progress Notes (Signed)
Initial Nutrition Assessment  DOCUMENTATION CODES:  Obesity unspecified  INTERVENTION:  Her documented weight indicates a very significant weight loss over the past few months. However, wt may have just been reported or estimated. Recommend reweight of patient using a standing scale.   Patient appears to be improving. Will monitor PO intake on regular diet and follow up as needed  NUTRITION DIAGNOSIS:  Inadequate oral intake related to nausea, vomiting, poor appetite, Epigastric pain with oral intake as evidenced by per patient/family report.  GOAL:  Patient will meet greater than or equal to 90% of their needs  MONITOR:  PO intake, Supplement acceptance, Labs, Weight trends  REASON FOR ASSESSMENT:  Malnutrition Screening Tool    ASSESSMENT:  26 y/o female PMHx chronic back pain, Gastric ulcers. Presented with 2 day history of abdominal pain, nausea, vomiting, diarrhea. Abdominal pain is now constant and is unable to tolerate any PO intake. Had been evaluated in ED 1 day prior for same symptoms with unremarkable workup. Admitted for likely viral gastroenteritis. Potentially has worsened secondary to [facility] use.   RD operating remotely. Per chart, patient is feeling better since she was admitted yesterday and seems to be improving with supportive care only. Diet has been advanced to regular after she was able to tolerate lear liquids. No documented meal intake as of yet.   Per ED providers note, patient had reported 2-3 months of episodic epigastric pain that was associated with eating, particularly with greasy foods. This pain radiated to back and she would also have n/v. She had decreased appetite due to symptoms and had reported weight loss.   Reviewed weights in chart. No documentation of scale being used for documented weight of 195 lbs. May have simply been reported. Would recommend a re weight with standing scale. If this weight is truly accurate, she would have had substantial  weight loss of 25 lbs in just 4 months (11% of bw).   At patient seems to be improving, will monitor PO intake and diet tolerance. Can add supplements if continues to have poor PO intake.   NFPE: Unable to assess  Labs: WBC: 15.5, Mag: 1.5, BG 90-150 Meds: Dilaudid, Reglan, Resource Charolette Forward, Pepcid, IVF   Recent Labs Lab 04/10/17 0726 04/11/17 1310 04/11/17 1946 04/12/17 0615  NA 139 140  --  141  K 3.5 3.4*  --  3.5  CL 107 109  --  107  CO2 22 20*  --  26  BUN 6 7  --  5*  CREATININE 0.99 0.95  --  0.72  CALCIUM 9.6 9.3  --  9.0  MG  --   --  1.5*  --   PHOS  --   --  2.9  --   GLUCOSE 111* 156*  --  91   Diet Order:  Diet regular Room service appropriate? Yes; Fluid consistency: Thin  Skin:  Reviewed, no issues  Last BM:  7/20  Height:  Ht Readings from Last 1 Encounters:  04/11/17 5\' 7"  (1.702 m)   Weight:  Wt Readings from Last 1 Encounters:  04/11/17 195 lb (88.5 kg)   Wt Readings from Last 10 Encounters:  04/11/17 195 lb (88.5 kg)  04/10/17 195 lb (88.5 kg)  12/10/16 219 lb (99.3 kg)  10/25/16 225 lb (102.1 kg)  09/10/16 227 lb (103 kg)  09/09/16 227 lb (103 kg)  06/15/16 200 lb (90.7 kg)  02/21/16 215 lb (97.5 kg)  01/12/16 205 lb (93 kg)  10/18/15 218  lb (98.9 kg)   Ideal Body Weight:  61.36 kg  BMI:  Body mass index is 30.54 kg/m.  Estimated Nutritional Needs:  Kcal:  1750-1950 (20-22 kcal/kg bw) Protein:  61-74g Pro (1-1.2 g/kg bw) Fluid:  1.8-2 Liters (1 ml/kcal)  EDUCATION NEEDS:  No education needs identified at this time  Burtis Junes RD, LDN, [facility] Pager: [phone] 04/12/2017 3:16 PM

## 2017-04-12 NOTE — Progress Notes (Signed)
PROGRESS NOTE    Debbie Santana  RCB:[id number] DOB: 20-Nov-1990 DOA: 04/11/2017 PCP: Patient, No Pcp Per     Brief Narrative:  26 year old female presented with nausea and vomiting and abdominal pain. Patient's known to have chronic back pain and gastric ulcers. Presents with 2 days of intermittent abdominal pain, consistent with nausea, vomiting and diarrhea. Seen in the emergency department 24 hours prior to admission, workup negative. On the physical examination blood pressure 125/70, heart rate 41, respiratory 14, oxygen saturation 100%. Dry mucous membranes colonoscopy to auscultation bilaterally, heart is most impressive abdomen, the abdomen was soft, nondistended; tender to deep palpation, no rebound or guarding. No lower extremity edema. Sodium 141, potassium 3.5, chloride 107, bicarbonate 26, glucose 91, BUN 5, creatinine 0.72, white count 15.5, hemoglobin 13.2, hematocrit 39.2, platelets 221. Urinalysis negative for infection, negative pregnancy test, CT of the abdomen negative. Negative abdominal ultrasound, EKG with normal sinus rhythm.  Working diagnosis. Acute gastroenteritis rule out infectious process  Assessment & Plan:   Active Problems:   Viral gastroenteritis   Intractable abdominal pain   Leukocytosis   Hyperglycemia   Anxiety   1. Acute gastroenteritis. Patient feeling better, will advanced diet to regular, will continue supportive medical therapy with IV fluids, IV analgesics, antiemetics and antiacids. Relative bradycardia, will check salmonella titers. No fever or chills, no signs of systemic infection, will hold on antibiotic therapy for now.   2. Leukocytosis. Likely reactive, will continue to follow cell count in am.  3. Anxiety. Will continue as needed lorazepam, unable to take po, will use IV for now.    DVT prophylaxis: enoxaparin  Code Status: full  Family Communication: I spoke with patient's husband at the bedside and all questions were addressed.    Disposition Plan: home    Consultants:     Procedures:   Antimicrobials:    Subjective: Patient feeling better, nausea and vomiting have improved, tolerating clear liquid diet, no dyspnea or chest pain.   Objective: Vitals:   04/11/17 1830 04/11/17 1900 04/11/17 2047 04/12/17 0705  BP: 123/85 (!) 113/53 137/84 107/62  Pulse: (!) 49 (!) 40 92 96  Resp: (!) 24 18 18 18   Temp:   97.7 F (36.5 C) 98.4 F (36.9 C)  TempSrc:   Oral Oral  SpO2: 100% 100% 100% 100%  Weight:      Height:        Intake/Output Summary (Last 24 hours) at 04/12/17 1144 Last data filed at 04/12/17 0300  Gross per 24 hour  Intake          3586.67 ml  Output                2 ml  Net          3584.67 ml   Filed Weights   04/11/17 1136  Weight: 88.5 kg (195 lb)    Examination:  General exam: deconditioned E ENT: no pallor or icterus, oral mucosa moist.  Respiratory system: Clear to auscultation. Respiratory effort normal. No wheezing, no rales or rhonchi.  Cardiovascular system: S1 & S2 heard, RRR. No JVD, murmurs, rubs, gallops or clicks. No pedal edema. Gastrointestinal system: Abdomen is nondistended, soft and nontender. No organomegaly or masses felt. Normal bowel sounds heard. Central nervous system: Alert and oriented. No focal neurological deficits. Extremities: Symmetric 5 x 5 power. Skin: No rashes, lesions or ulcers     Data Reviewed: I have personally reviewed following labs and imaging studies  CBC:  Recent Labs  Lab 04/10/17 0726 04/11/17 1310 04/12/17 0615  WBC 15.0* 17.5* 15.5*  NEUTROABS  --  14.7*  --   HGB 14.8 14.4 13.2  HCT 42.5 41.7 39.2  MCV 90.8 91.2 93.1  PLT 265 260 921   Basic Metabolic Panel:  Recent Labs Lab 04/10/17 0726 04/11/17 1310 04/11/17 1946 04/12/17 0615  NA 139 140  --  141  K 3.5 3.4*  --  3.5  CL 107 109  --  107  CO2 22 20*  --  26  GLUCOSE 111* 156*  --  91  BUN 6 7  --  5*  CREATININE 0.99 0.95  --  0.72  CALCIUM 9.6 9.3   --  9.0  MG  --   --  1.5*  --   PHOS  --   --  2.9  --    GFR: Estimated Creatinine Clearance: 121.8 mL/min (by C-G formula based on SCr of 0.72 mg/dL). Liver Function Tests:  Recent Labs Lab 04/10/17 0726 04/11/17 1310  AST 18 20  ALT 14 14  ALKPHOS 63 60  BILITOT 0.8 1.1  PROT 6.7 6.8  ALBUMIN 4.2 4.0    Recent Labs Lab 04/10/17 0726 04/11/17 1310  LIPASE 88* 19   No results for input(s): AMMONIA in the last 168 hours. Coagulation Profile: No results for input(s): INR, PROTIME in the last 168 hours. Cardiac Enzymes: No results for input(s): CKTOTAL, CKMB, CKMBINDEX, TROPONINI in the last 168 hours. BNP (last 3 results) No results for input(s): PROBNP in the last 8760 hours. HbA1C: No results for input(s): HGBA1C in the last 72 hours. CBG: No results for input(s): GLUCAP in the last 168 hours. Lipid Profile: No results for input(s): CHOL, HDL, LDLCALC, TRIG, CHOLHDL, LDLDIRECT in the last 72 hours. Thyroid Function Tests: No results for input(s): TSH, T4TOTAL, FREET4, T3FREE, THYROIDAB in the last 72 hours. Anemia Panel: No results for input(s): VITAMINB12, FOLATE, FERRITIN, TIBC, IRON, RETICCTPCT in the last 72 hours. Sepsis Labs:  Recent Labs Lab 04/11/17 1942  LATICACIDVEN 1.4    No results found for this or any previous visit (from the past 240 hour(s)).       Radiology Studies: Ct Abdomen Pelvis W Contrast  Result Date: 04/10/2017 CLINICAL DATA:  Nausea and vomiting for several months EXAM: CT ABDOMEN AND PELVIS WITH CONTRAST TECHNIQUE: Multidetector CT imaging of the abdomen and pelvis was performed using the standard protocol following bolus administration of intravenous contrast. CONTRAST:  157mL ISOVUE-300 IOPAMIDOL (ISOVUE-300) INJECTION 61% COMPARISON:  Ultrasound from earlier in the same day. FINDINGS: Lower chest: No acute abnormality. Hepatobiliary: No focal liver abnormality is seen. No gallstones, gallbladder wall thickening, or biliary  dilatation. Pancreas: Unremarkable. No pancreatic ductal dilatation or surrounding inflammatory changes. Spleen: Normal in size without focal abnormality. Adrenals/Urinary Tract: Adrenal glands are unremarkable. Kidneys are normal, without renal calculi, focal lesion, or hydronephrosis. Bladder is decompressed. Stomach/Bowel: Stomach is within normal limits. Appendix appears normal. No evidence of bowel wall thickening, distention, or inflammatory changes. Vascular/Lymphatic: No significant vascular findings are present. No enlarged abdominal or pelvic lymph nodes. Reproductive: Uterus and bilateral adnexa are unremarkable. Other: No abdominal wall hernia or abnormality. No abdominopelvic ascites. Musculoskeletal: No acute or significant osseous findings. IMPRESSION: No acute abnormality noted. Electronically Signed   By: Inez Catalina M.D.   On: 04/10/2017 12:35        Scheduled Meds: . enoxaparin (LOVENOX) injection  40 mg Subcutaneous Q24H  . feeding supplement  1 Container Oral TID  BM  .  HYDROmorphone (DILAUDID) injection  0.5 mg Intravenous Once  . metoCLOPramide (REGLAN) injection  10 mg Intravenous Q8H  . sucralfate  1 g Oral TID WC & HS   Continuous Infusions: . sodium chloride 100 mL/hr at 04/12/17 0917  . famotidine (PEPCID) IV Stopped (04/12/17 6950)     LOS: 0 days       Prateek Knipple Gerome Apley, MD [facility] Pager [phone]  If 7PM-7AM, please contact night-coverage [email or web address] Password [facility] 04/12/2017, 11:44 AM

## 2017-04-12 NOTE — Progress Notes (Signed)
Patient still having N/V.  Unable to collect stool sample for tests as patient has had no Bowel movements during this shift.

## 2017-04-13 DIAGNOSIS — K279 Peptic ulcer, site unspecified, unspecified as acute or chronic, without hemorrhage or perforation: Secondary | ICD-10-CM | POA: Diagnosis present

## 2017-04-13 DIAGNOSIS — R112 Nausea with vomiting, unspecified: Secondary | ICD-10-CM

## 2017-04-13 DIAGNOSIS — E876 Hypokalemia: Secondary | ICD-10-CM

## 2017-04-13 DIAGNOSIS — R1013 Epigastric pain: Secondary | ICD-10-CM

## 2017-04-13 HISTORY — DX: Peptic ulcer, site unspecified, unspecified as acute or chronic, without hemorrhage or perforation: K27.9

## 2017-04-13 LAB — CBC WITH DIFFERENTIAL/PLATELET
Basophils Absolute: 0 10*3/uL (ref 0.0–0.1)
Basophils Relative: 0 %
Eosinophils Absolute: 0 10*3/uL (ref 0.0–0.7)
Eosinophils Relative: 0 %
HEMATOCRIT: 37.9 % (ref 36.0–46.0)
HEMOGLOBIN: 12.7 g/dL (ref 12.0–15.0)
LYMPHS ABS: 3.5 10*3/uL (ref 0.7–4.0)
LYMPHS PCT: 27 %
MCH: 31.2 pg (ref 26.0–34.0)
MCHC: 33.5 g/dL (ref 30.0–36.0)
MCV: 93.1 fL (ref 78.0–100.0)
MONOS PCT: 7 %
Monocytes Absolute: 1 10*3/uL (ref 0.1–1.0)
NEUTROS PCT: 66 %
Neutro Abs: 8.7 10*3/uL — ABNORMAL HIGH (ref 1.7–7.7)
Platelets: 194 10*3/uL (ref 150–400)
RBC: 4.07 MIL/uL (ref 3.87–5.11)
RDW: 13.2 % (ref 11.5–15.5)
WBC: 13.2 10*3/uL — AB (ref 4.0–10.5)

## 2017-04-13 LAB — BASIC METABOLIC PANEL
Anion gap: 7 (ref 5–15)
CHLORIDE: 107 mmol/L (ref 101–111)
CO2: 26 mmol/L (ref 22–32)
CREATININE: 0.69 mg/dL (ref 0.44–1.00)
Calcium: 8.7 mg/dL — ABNORMAL LOW (ref 8.9–10.3)
GFR calc Af Amer: >60 mL/min (ref >60)
GFR calc non Af Amer: >60 mL/min (ref >60)
GLUCOSE: 83 mg/dL (ref 65–99)
POTASSIUM: 3 mmol/L — AB (ref 3.5–5.1)
Sodium: 140 mmol/L (ref 135–145)

## 2017-04-13 MED ORDER — SODIUM CHLORIDE 0.9 % IV SOLN: INTRAVENOUS | Status: DC

## 2017-04-13 MED ORDER — PANTOPRAZOLE SODIUM 40 MG IV SOLR
40.0000 mg | Freq: Two times a day (BID) | INTRAVENOUS | Status: DC
  Administered 2017-04-13 – 2017-04-14 (×2): 40 mg via INTRAVENOUS
  Filled 2017-04-13 (×2): qty 40

## 2017-04-13 MED ORDER — NICOTINE 7 MG/24HR TD PT24
7.0000 mg | MEDICATED_PATCH | Freq: Every day | TRANSDERMAL | Status: DC
  Administered 2017-04-14: 7 mg via TRANSDERMAL
  Filled 2017-04-13 (×2): qty 1

## 2017-04-13 MED ORDER — SODIUM CHLORIDE 0.9 % IV SOLN
INTRAVENOUS | Status: DC
  Administered 2017-04-14: via INTRAVENOUS

## 2017-04-13 MED ORDER — DEXTROSE-NACL 5-0.9 % IV SOLN
INTRAVENOUS | Status: DC
  Administered 2017-04-13 (×2): via INTRAVENOUS

## 2017-04-13 MED ORDER — POTASSIUM CHLORIDE 10 MEQ/100ML IV SOLN
10.0000 meq | INTRAVENOUS | Status: AC
  Administered 2017-04-13 (×4): 10 meq via INTRAVENOUS
  Filled 2017-04-13 (×2): qty 100

## 2017-04-13 MED ORDER — MAGNESIUM SULFATE 50 % IJ SOLN
Freq: Once | INTRAVENOUS | Status: AC
  Administered 2017-04-13: 11:00:00 via INTRAVENOUS
  Filled 2017-04-13: qty 50

## 2017-04-13 MED ORDER — GI COCKTAIL ~~LOC~~
30.0000 mL | Freq: Once | ORAL | Status: AC
  Administered 2017-04-13: 30 mL via ORAL
  Filled 2017-04-13: qty 30

## 2017-04-13 MED ORDER — LORAZEPAM 2 MG/ML IJ SOLN
0.5000 mg | Freq: Once | INTRAMUSCULAR | Status: AC
  Administered 2017-04-13: 0.5 mg via INTRAVENOUS
  Filled 2017-04-13: qty 1

## 2017-04-13 MED ORDER — POTASSIUM CHLORIDE 10 MEQ/100ML IV SOLN
10.0000 meq | INTRAVENOUS | Status: AC
  Administered 2017-04-13 (×4): 10 meq via INTRAVENOUS
  Filled 2017-04-13 (×4): qty 100

## 2017-04-13 MED ORDER — MAGNESIUM SULFATE 2 GM/50ML IV SOLN
2.0000 g | Freq: Once | INTRAVENOUS | Status: DC
  Filled 2017-04-13: qty 50

## 2017-04-13 NOTE — Progress Notes (Signed)
PROGRESS NOTE    Debbie Santana  RSW:[id number] DOB: 26-Jun-1991 DOA: 04/11/2017 PCP: Patient, No Pcp Per    Brief Narrative:  26 year old female presented with nausea and vomiting and abdominal pain. Patient's known to have chronic back pain and gastric ulcers. Presents with 2 days of intermittent abdominal pain, consistent with nausea, vomiting and diarrhea. Seen in the emergency department 24 hours prior to admission, workup negative. On the physical examination blood pressure 125/70, heart rate 41, respiratory 14, oxygen saturation 100%. Dry mucous membranes colonoscopy to auscultation bilaterally, heart is most impressive abdomen, the abdomen was soft, nondistended; tender to deep palpation, no rebound or guarding. No lower extremity edema. Sodium 141, potassium 3.5, chloride 107, bicarbonate 26, glucose 91, BUN 5, creatinine 0.72, white count 15.5, hemoglobin 13.2, hematocrit 39.2, platelets 221. Urinalysis negative for infection, negative pregnancy test, CT of the abdomen negative. Negative abdominal ultrasound, EKG with normal sinus rhythm.  Working diagnosis. Acute gastroenteritis rule out infectious process, peptic ulcer disease   Assessment & Plan:   Active Problems:   Viral gastroenteritis   Intractable abdominal pain   Leukocytosis   Hyperglycemia   Anxiety  1. Acute gastroenteritis. Patient with persistent nausea and vomiting, abdominal pain and anxiety. Mild improvement with hydromorphone and lorazepam IV, not tolerating regular diet. Will change to clear liquid diet, continue antiacid therapy and supportive medical care. Consulted GI for further evaluation.   2. Leukocytosis. Suspected to be reactive, hold on antibiotics, wbc at 13 from 15.   3. Anxiety. Lorazepam IV as needed.  4. Tobacco abuse. Will continue nicotine patch, for smoking cessation.   5. Hypokalemia and hypomagnesemia. Will replete K, with 80 meq of kcl in 2 divided doses IV, magnesium sulfate  2  grams IV . Electrolyte disturbances due to GI losses. Will follow renal panel in am, continue IV fluids with isotonic saline and dextrose.   DVT prophylaxis: enoxaparin  Code Status: full  Family Communication: I spoke with patient's husband at the bedside and all questions were addressed.  Disposition Plan: home    Consultants:     Procedures:   Antimicrobials:    Subjective: Patient continue to have nausea and vomiting, abdominal pain, fever and chills, not able to tolerate regular diet. Episodic anxiety, requiring anxiolytics.   Objective: Vitals:   04/12/17 1359 04/12/17 1810 04/12/17 2138 04/13/17 0552  BP: 116/65  130/64 130/70  Pulse: 86  (!) 106 68  Resp: 16  18 18   Temp: 99 F (37.2 C) 99.2 F (37.3 C) 98.4 F (36.9 C) 98.4 F (36.9 C)  TempSrc: Oral Oral Oral Oral  SpO2: 98%  100% 100%  Weight:      Height:        Intake/Output Summary (Last 24 hours) at 04/13/17 1010 Last data filed at 04/12/17 1300  Gross per 24 hour  Intake              240 ml  Output                0 ml  Net              240 ml   Filed Weights   04/11/17 1136  Weight: 88.5 kg (195 lb)    Examination:  General exam: deconditioned E ENT: no pallor or icterus, oral mucosa moist  Respiratory system: No wheezing, rales or rhonchi. Respiratory effort normal. Cardiovascular system: S1 & S2 heard, RRR. No JVD, murmurs, rubs, gallops or clicks. No pedal edema. Gastrointestinal system:  Abdomen is mild distended, mild tender to deep palpation at the epigastric region. No organomegaly or masses felt. Normal bowel sounds heard. Central nervous system: Alert and oriented. No focal neurological deficits. Extremities: Symmetric 5 x 5 power. Skin: No rashes, lesions or ulcers     Data Reviewed: I have personally reviewed following labs and imaging studies  CBC:  Recent Labs Lab 04/10/17 0726 04/11/17 1310 04/12/17 0615 04/13/17 0542  WBC 15.0* 17.5* 15.5* 13.2*  NEUTROABS   --  14.7*  --  8.7*  HGB 14.8 14.4 13.2 12.7  HCT 42.5 41.7 39.2 37.9  MCV 90.8 91.2 93.1 93.1  PLT 265 260 221 174   Basic Metabolic Panel:  Recent Labs Lab 04/10/17 0726 04/11/17 1310 04/11/17 1946 04/12/17 0615 04/13/17 0542  NA 139 140  --  141 140  K 3.5 3.4*  --  3.5 3.0*  CL 107 109  --  107 107  CO2 22 20*  --  26 26  GLUCOSE 111* 156*  --  91 83  BUN 6 7  --  5* <5*  CREATININE 0.99 0.95  --  0.72 0.69  CALCIUM 9.6 9.3  --  9.0 8.7*  MG  --   --  1.5*  --   --   PHOS  --   --  2.9  --   --    GFR: Estimated Creatinine Clearance: 121.8 mL/min (by C-G formula based on SCr of 0.69 mg/dL). Liver Function Tests:  Recent Labs Lab 04/10/17 0726 04/11/17 1310  AST 18 20  ALT 14 14  ALKPHOS 63 60  BILITOT 0.8 1.1  PROT 6.7 6.8  ALBUMIN 4.2 4.0    Recent Labs Lab 04/10/17 0726 04/11/17 1310  LIPASE 88* 19   No results for input(s): AMMONIA in the last 168 hours. Coagulation Profile: No results for input(s): INR, PROTIME in the last 168 hours. Cardiac Enzymes: No results for input(s): CKTOTAL, CKMB, CKMBINDEX, TROPONINI in the last 168 hours. BNP (last 3 results) No results for input(s): PROBNP in the last 8760 hours. HbA1C: No results for input(s): HGBA1C in the last 72 hours. CBG: No results for input(s): GLUCAP in the last 168 hours. Lipid Profile: No results for input(s): CHOL, HDL, LDLCALC, TRIG, CHOLHDL, LDLDIRECT in the last 72 hours. Thyroid Function Tests: No results for input(s): TSH, T4TOTAL, FREET4, T3FREE, THYROIDAB in the last 72 hours. Anemia Panel: No results for input(s): VITAMINB12, FOLATE, FERRITIN, TIBC, IRON, RETICCTPCT in the last 72 hours. Sepsis Labs:  Recent Labs Lab 04/11/17 1942  LATICACIDVEN 1.4    No results found for this or any previous visit (from the past 240 hour(s)).       Radiology Studies: No results found.      Scheduled Meds: . enoxaparin (LOVENOX) injection  40 mg Subcutaneous Q24H  . feeding  supplement  1 Container Oral TID BM  . gi cocktail  30 mL Oral Once  .  HYDROmorphone (DILAUDID) injection  0.5 mg Intravenous Once  . metoCLOPramide (REGLAN) injection  10 mg Intravenous Q8H  . nicotine  21 mg Transdermal Daily   Continuous Infusions: . sodium chloride 75 mL/hr at 04/12/17 1238  . famotidine (PEPCID) IV Stopped (04/13/17 0920)     LOS: 0 days        Leotha Voeltz Gerome Apley, MD [facility] Pager [phone]  If 7PM-7AM, please contact night-coverage [email or web address] Password [email or web address] 04/13/2017, 10:10 AM

## 2017-04-13 NOTE — Progress Notes (Signed)
Patient continues to have uncontrollable N/V.  Patient pain medication switched back to dilaudid from morphine due to morphine not controlling patient's pain.  Patient still unable to provide stool sample for C-Diff testing.

## 2017-04-13 NOTE — Progress Notes (Signed)
Patient vomiting X 3 this morning.

## 2017-04-13 NOTE — Consult Note (Signed)
Referring Provider: Tawni Millers, MD Primary Care Physician:  Patient, No Pcp Per Primary Gastroenterologist:  Dr. Laural Golden  Reason for Consultation:    Nausea vomiting and epigastric pain.  HPI:   History is provided by the patient and her fianc Chrissie Noa who is at bedside.  Patient is 26 year old Caucasian female who was at recurrent epigastric pain and intermittent nausea and vomiting for several months. For the last 3-4 days she's been having severe epigastric pain with nausea and vomiting. According to her fianc she did notice small amount of blood on one or 2 occasions when she was heaving. She was having loose stools prior to admission but she hasn't had any bowel movement in the hospital. She denies melena or rectal bleeding. Patient says she does not have a physician or insurance and has been taking over-the-counter medications which include ranitidine and Dramamine. She also takes Excedrin no more than twice a week for back pain or headache. She was seen in emergency room on 04/10/2017. Lab studies were pertinent for WBC of 15.0 H&H of 14.8 and 42.5 and platelet count was 465K. Comprehensive chemistry panel was normal except glucose of 156. Serum lipase was mildly elevated at 88. She underwent abdominal ultrasound was negative for cholelithiasis or dilated bile duct. She also underwent abdominopelvic CT with contrast and no abnormality noted. Patient was discharged but she returned 2 days ago with persistent symptoms. WBC was 17.5. Comprehensive chemistry panel was pertinent for serum potassium of 3.4 and glucose was 156. Lipase was 19. Patient was hospitalized and begun on IV fluids IV famotidine as well as antibiotics and pain medication. She was begun on a diet but she began to vomit. She continues to complain of pain in her upper abdomen. She has nausea and vomiting. She also complains of lower abdominal cramping. She has not had a BM today. She denies dysuria or vaginal  discharge. She has history of genital HPV. According to her fianc she has lost about 25 pounds in last 1 year.  She is engaged. She does not drink alcohol. She smokes few cigarettes per day. 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 She says both her parents have anxiety problem and so does her 2 sisters.  She lost her brother at age 13 or 38. He had mental retardation.   Past Medical History:  Diagnosis Date  . Chronic back pain   . Peptic ulcer disease. Clinical diagnosis     Genital HPV        Chronic anxiety.  Past Surgical History:  Procedure Laterality Date  . BACK SURGERY for disc disease 11 years ago.     Marland Kitchen HERNIA REPAIR Bilateral inguinal       Prior to Admission medications   Medication Sig Start Date End Date Taking? Authorizing Provider  dimenhyDRINATE (DRAMAMINE) 50 MG tablet Take 50 mg by mouth every morning.   Yes [provider]  promethazine (PHENERGAN) 25 MG tablet Take 1 tablet (25 mg total) by mouth every 6 (six) hours as needed for nausea. 04/10/17  Yes Domenic Moras, PA-C  ranitidine (ZANTAC) 150 MG tablet Take 300 mg by mouth daily.   Yes [provider]    Current Facility-Administered Medications  Medication Dose Route Frequency Provider Last Rate Last Dose  . acetaminophen (TYLENOL) suppository 325 mg  325 mg Rectal Q4H PRN Arrien, Jimmy Picket, MD      . acetaminophen (TYLENOL) tablet 650 mg  650 mg Oral Q6H PRN Arrien, Jimmy Picket, MD      .  alum & mag hydroxide-simeth (MAALOX/MYLANTA) 200-200-20 MG/5ML suspension 30 mL  30 mL Oral Q4H PRN Orvan Falconer, MD   30 mL at 04/12/17 0202  . dextrose 5 %-0.9 % sodium chloride infusion   Intravenous Continuous Tawni Millers, MD 75 mL/hr at 04/13/17 1050    . feeding supplement (BOOST / RESOURCE BREEZE) liquid 1 Container  1 Container Oral TID BM Waldemar Dickens, MD   1 Container at 04/12/17 2000  . HYDROmorphone (DILAUDID) injection 0.5 mg  0.5 mg Intravenous  Once Julianne Rice, MD   Stopped at 04/11/17 1410  . HYDROmorphone (DILAUDID) injection 0.5 mg  0.5 mg Intravenous Q4H PRN Opyd, Ilene Qua, MD   0.5 mg at 04/13/17 0917  . LORazepam (ATIVAN) injection 0.5 mg  0.5 mg Intravenous Q6H PRN Arrien, Jimmy Picket, MD   0.5 mg at 04/13/17 5993  . metoCLOPramide (REGLAN) injection 10 mg  10 mg Intravenous Q8H Waldemar Dickens, MD   10 mg at 04/13/17 0558  . nicotine (NICODERM CQ - dosed in mg/24 hours) patch 21 mg  21 mg Transdermal Daily Arrien, Jimmy Picket, MD   21 mg at 04/12/17 1803  . ondansetron (ZOFRAN) injection 4 mg  4 mg Intravenous Q4H PRN Arrien, Jimmy Picket, MD   4 mg at 04/13/17 [phone]  . pantoprazole (PROTONIX) injection 40 mg  40 mg Intravenous Q12H Carleah Yablonski U, MD      . potassium chloride 10 mEq in 100 mL IVPB  10 mEq Intravenous Q1 Hr x 4 Arrien, Jimmy Picket, MD   Stopped at 04/13/17 1129  . potassium chloride 10 mEq in 100 mL IVPB  10 mEq Intravenous Q1 Hr x 4 Arrien, Jimmy Picket, MD      . promethazine [facility]) injection 12.5 mg  12.5 mg Intravenous Q6H PRN Waldemar Dickens, MD   12.5 mg at 04/13/17 1206    Allergies as of 04/11/2017  . (No Known Allergies)    Family History  Problem Relation Age of Onset  . Anxiety disorder Mother     Social History   Social History  . Marital status: Single    Spouse name: N/A  . Number of children: N/A  . Years of education: N/A   Occupational History  . Not on file.   Social History Main Topics  . Smoking status: Current Every Day Smoker    Packs/day: 1.00    Types: Cigarettes  . Smokeless tobacco: Never Used     Comment: never done snuff or chewing tobacco.  . Alcohol use No     Comment: occasionally  . Drug use: Yes    Types: Marijuana     Comment: 04/11/17  . Sexual activity: Yes    Birth control/ protection: Pill   Other Topics Concern  . Not on file   Social History Narrative  . No narrative on file    Review of Systems: See HPI,  otherwise normal ROS  Physical Exam: Temp:  [98.4 F (36.9 C)-99.2 F (37.3 C)] 98.4 F (36.9 C) (07/22 0552) Pulse Rate:  [68-106] 68 (07/22 0552) Resp:  [16-18] 18 (07/22 0552) BP: (116-130)/(64-70) 130/70 (07/22 0552) SpO2:  [98 %-100 %] 100 % (07/22 0552) Last BM Date: 04/12/17  Patient is alert and appears to be anxious. She is quite restless. Conjunctiva was pink. Sclerae nonicteric. Oral mucosa is normal. No neck masses or thyromegaly noted. Cardiac exam with regular rhythm normal S1 and S2. No murmur or gallop noted. Lungs are clear to  auscultation. Abdomen is flat. Bowel sounds are normal. On palpation abdomen is soft with mild tenderness in midepigastric region below the right costal margin as well as periumbilical region. No organomegaly or masses. No peripheral edema or clubbing noted but she has multiple tattoos over extremities.  Lab Results:  Recent Labs  04/11/17 1310 04/12/17 0615 04/13/17 0542  WBC 17.5* 15.5* 13.2*  HGB 14.4 13.2 12.7  HCT 41.7 39.2 37.9  PLT 260 221 194   BMET  Recent Labs  04/11/17 1310 04/12/17 0615 04/13/17 0542  NA 140 141 140  K 3.4* 3.5 3.0*  CL 109 107 107  CO2 20* 26 26  GLUCOSE 156* 91 83  BUN 7 5* <5*  CREATININE 0.95 0.72 0.69  CALCIUM 9.3 9.0 8.7*   LFT  Recent Labs  04/11/17 1310  PROT 6.8  ALBUMIN 4.0  AST 20  ALT 14  ALKPHOS 60  BILITOT 1.1   Ultrasound and abdominopelvic CT images from 04/10/2017 reviewed.   Assessment;  Patient is 26 year old Caucasian female who presents with acute on chronic nausea vomiting and epigastric pain unresponsive therapy. She has been told that she has peptic ulcer disease based on her symptomatology. She also had nonbloody diarrhea prior to admission but none since then. Lab studies are unremarkable except mild leukocytosis and isolated mild elevation of serum lipase which would appear to be nonspecific. She underwent ultrasound and abdominopelvic CT one day prior to  admission and the studies are unremarkable. More specifically no evidence of pancreatitis. Peptic ulcer disease needs to be ruled out as well as biliary dyskinesia but it is possible that her symptoms a manifestation of an extreme anxiety. She will benefit from diagnostic esophagogastroduodenoscopy and of the study is normal will consider HIDA scan with CCK.   Recommendations;  Nothing by mouth except by mouth medications. Discontinue famotidine and metoclopramide. Pantoprazole 40 mg IV every 12 hours. HCV antibody(patient has multiple tattoos). Diagnostic esophagogastroduodenoscopy on 04/14/2017 under monitored anesthesia care. Hold Lovenox dose this evening.   LOS: 0 days   Beldon Nowling  04/13/2017, 12:38 PM

## 2017-04-14 ENCOUNTER — Encounter (HOSPITAL_COMMUNITY)
Admission: EM | Disposition: A | Discharge status: Home / Self Care | Payer: Self-pay | Source: Home / Self Care | Attending: Internal Medicine

## 2017-04-14 ENCOUNTER — Inpatient Hospital Stay (HOSPITAL_COMMUNITY): Payer: Self-pay | Admitting: Anesthesiology

## 2017-04-14 ENCOUNTER — Encounter (HOSPITAL_COMMUNITY): Payer: Self-pay | Admitting: Anesthesiology

## 2017-04-14 DIAGNOSIS — K2900 Acute gastritis without bleeding: Secondary | ICD-10-CM

## 2017-04-14 DIAGNOSIS — K210 Gastro-esophageal reflux disease with esophagitis: Secondary | ICD-10-CM

## 2017-04-14 DIAGNOSIS — K228 Other specified diseases of esophagus: Secondary | ICD-10-CM

## 2017-04-14 HISTORY — PX: ESOPHAGOGASTRODUODENOSCOPY (EGD) WITH PROPOFOL: SHX5813

## 2017-04-14 HISTORY — PX: BIOPSY: SHX5522

## 2017-04-14 LAB — BASIC METABOLIC PANEL
Anion gap: 9 (ref 5–15)
CO2: 28 mmol/L (ref 22–32)
CREATININE: 0.86 mg/dL (ref 0.44–1.00)
Calcium: 9.4 mg/dL (ref 8.9–10.3)
Chloride: 101 mmol/L (ref 101–111)
Glucose, Bld: 92 mg/dL (ref 65–99)
POTASSIUM: 2.9 mmol/L — AB (ref 3.5–5.1)
SODIUM: 138 mmol/L (ref 135–145)

## 2017-04-14 LAB — MAGNESIUM: MAGNESIUM: 1.9 mg/dL (ref 1.7–2.4)

## 2017-04-14 LAB — CBC WITH DIFFERENTIAL/PLATELET
BASOS ABS: 0 10*3/uL (ref 0.0–0.1)
Basophils Relative: 0 %
EOS ABS: 0 10*3/uL (ref 0.0–0.7)
EOS PCT: 0 %
HCT: 42.8 % (ref 36.0–46.0)
HEMOGLOBIN: 14.6 g/dL (ref 12.0–15.0)
LYMPHS PCT: 34 %
Lymphs Abs: 3.3 10*3/uL (ref 0.7–4.0)
MCH: 31.6 pg (ref 26.0–34.0)
MCHC: 34.1 g/dL (ref 30.0–36.0)
MCV: 92.6 fL (ref 78.0–100.0)
Monocytes Absolute: 0.9 10*3/uL (ref 0.1–1.0)
Monocytes Relative: 9 %
NEUTROS PCT: 57 %
Neutro Abs: 5.3 10*3/uL (ref 1.7–7.7)
PLATELETS: 221 10*3/uL (ref 150–400)
RBC: 4.62 MIL/uL (ref 3.87–5.11)
RDW: 13 % (ref 11.5–15.5)
WBC: 9.5 10*3/uL (ref 4.0–10.5)

## 2017-04-14 LAB — PREGNANCY, URINE: Preg Test, Ur: NEGATIVE

## 2017-04-14 SURGERY — ESOPHAGOGASTRODUODENOSCOPY (EGD) WITH PROPOFOL
Anesthesia: Monitor Anesthesia Care

## 2017-04-14 MED ORDER — NEOSTIGMINE METHYLSULFATE 10 MG/10ML IV SOLN
INTRAVENOUS | Status: AC
Start: 2017-04-14 — End: 2017-04-14
  Filled 2017-04-14: qty 3

## 2017-04-14 MED ORDER — ONDANSETRON HCL 4 MG/2ML IJ SOLN
INTRAMUSCULAR | Status: AC
  Filled 2017-04-14: qty 2

## 2017-04-14 MED ORDER — PANTOPRAZOLE SODIUM 40 MG PO TBEC
40.0000 mg | DELAYED_RELEASE_TABLET | Freq: Two times a day (BID) | ORAL | Status: DC
  Administered 2017-04-14 – 2017-04-18 (×9): 40 mg via ORAL
  Filled 2017-04-14 (×9): qty 1

## 2017-04-14 MED ORDER — POTASSIUM CHLORIDE 10 MEQ/100ML IV SOLN
10.0000 meq | INTRAVENOUS | Status: AC
  Administered 2017-04-14 (×4): 10 meq via INTRAVENOUS
  Filled 2017-04-14: qty 100

## 2017-04-14 MED ORDER — PROPOFOL 500 MG/50ML IV EMUL
INTRAVENOUS | Status: DC | PRN
  Administered 2017-04-14: 150 ug/kg/min via INTRAVENOUS
  Administered 2017-04-14: 08:00:00 via INTRAVENOUS

## 2017-04-14 MED ORDER — SUCCINYLCHOLINE CHLORIDE 20 MG/ML IJ SOLN
INTRAMUSCULAR | Status: AC
  Filled 2017-04-14: qty 1

## 2017-04-14 MED ORDER — LIDOCAINE HCL (CARDIAC) 10 MG/ML IV SOLN
INTRAVENOUS | Status: DC | PRN
  Administered 2017-04-14: 40 mg via INTRAVENOUS

## 2017-04-14 MED ORDER — MIDAZOLAM HCL 2 MG/2ML IJ SOLN
1.0000 mg | INTRAMUSCULAR | Status: AC
  Administered 2017-04-14: 2 mg via INTRAVENOUS

## 2017-04-14 MED ORDER — NEOSTIGMINE METHYLSULFATE 10 MG/10ML IV SOLN
INTRAVENOUS | Status: AC
  Filled 2017-04-14: qty 3

## 2017-04-14 MED ORDER — POTASSIUM CHLORIDE CRYS ER 20 MEQ PO TBCR
40.0000 meq | EXTENDED_RELEASE_TABLET | Freq: Once | ORAL | Status: AC
  Administered 2017-04-14: 40 meq via ORAL
  Filled 2017-04-14: qty 2

## 2017-04-14 MED ORDER — LIDOCAINE HCL (PF) 1 % IJ SOLN
INTRAMUSCULAR | Status: AC
  Filled 2017-04-14: qty 5

## 2017-04-14 MED ORDER — FENTANYL CITRATE (PF) 100 MCG/2ML IJ SOLN
INTRAMUSCULAR | Status: AC
  Filled 2017-04-14: qty 2

## 2017-04-14 MED ORDER — FENTANYL CITRATE (PF) 100 MCG/2ML IJ SOLN
25.0000 ug | Freq: Once | INTRAMUSCULAR | Status: AC
  Administered 2017-04-14: 25 ug via INTRAVENOUS

## 2017-04-14 MED ORDER — MIDAZOLAM HCL 2 MG/2ML IJ SOLN
INTRAMUSCULAR | Status: AC
  Filled 2017-04-14: qty 2

## 2017-04-14 MED ORDER — LACTATED RINGERS IV SOLN
INTRAVENOUS | Status: DC
  Administered 2017-04-14: 1000 mL via INTRAVENOUS

## 2017-04-14 MED ORDER — POTASSIUM CHLORIDE CRYS ER 20 MEQ PO TBCR: 20.0000 meq | EXTENDED_RELEASE_TABLET | Freq: Once | ORAL | Status: DC

## 2017-04-14 MED ORDER — GI COCKTAIL ~~LOC~~
30.0000 mL | Freq: Once | ORAL | Status: AC
  Administered 2017-04-14: 30 mL via ORAL
  Filled 2017-04-14: qty 30

## 2017-04-14 MED ORDER — NICOTINE 7 MG/24HR TD PT24
7.0000 mg | MEDICATED_PATCH | Freq: Once | TRANSDERMAL | Status: AC
  Administered 2017-04-14: 7 mg via TRANSDERMAL
  Filled 2017-04-14: qty 1

## 2017-04-14 MED ORDER — POTASSIUM CHLORIDE 10 MEQ/100ML IV SOLN
INTRAVENOUS | Status: AC
  Administered 2017-04-14: 10 meq via INTRAVENOUS
  Filled 2017-04-14: qty 100

## 2017-04-14 MED ORDER — LIDOCAINE VISCOUS 2 % MT SOLN
3.0000 mL | Freq: Once | OROMUCOSAL | Status: AC
  Administered 2017-04-14: 3 mL via OROMUCOSAL

## 2017-04-14 MED ORDER — POTASSIUM CHLORIDE 10 MEQ/100ML IV SOLN: 10.0000 meq | INTRAVENOUS | Status: DC

## 2017-04-14 MED ORDER — ONDANSETRON HCL 4 MG/2ML IJ SOLN
4.0000 mg | Freq: Once | INTRAMUSCULAR | Status: AC
  Administered 2017-04-14: 4 mg via INTRAVENOUS

## 2017-04-14 MED ORDER — NICOTINE 14 MG/24HR TD PT24
14.0000 mg | MEDICATED_PATCH | Freq: Every day | TRANSDERMAL | Status: DC
  Administered 2017-04-15 – 2017-04-18 (×4): 14 mg via TRANSDERMAL
  Filled 2017-04-14 (×4): qty 1

## 2017-04-14 MED ORDER — PROPOFOL 10 MG/ML IV BOLUS
INTRAVENOUS | Status: AC
  Filled 2017-04-14: qty 60

## 2017-04-14 MED ORDER — MAGNESIUM SULFATE IN D5W 1-5 GM/100ML-% IV SOLN
1.0000 g | Freq: Once | INTRAVENOUS | Status: AC
  Administered 2017-04-14: 1 g via INTRAVENOUS
  Filled 2017-04-14 (×2): qty 100

## 2017-04-14 MED ORDER — LIDOCAINE VISCOUS 2 % MT SOLN
OROMUCOSAL | Status: AC
Start: 2017-04-14 — End: 2017-04-14
  Filled 2017-04-14: qty 15

## 2017-04-14 NOTE — Progress Notes (Signed)
Present with Ms. Messman for support. She stated she was beginning to feel better. Prayed with her around her anxiety.

## 2017-04-14 NOTE — Progress Notes (Signed)
Patient continues complain of epigastric pain and nausea. She has no other complaints. Her condition has not changed since she was last seen yesterday afternoon. Procedure explained to the patient and her mother who is at bedside. Questions answered. Patient is ready to proceed with EGD under monitored anesthesia care.

## 2017-04-14 NOTE — Progress Notes (Signed)
pts boyfriend requesting to wheel pt down to go "get some fresh air and take a few puffs off of a cigarette" pt and boyfriend advised that if going off the floor to go smoke, pt will be considered discharged and have to be readmitted through the ED. Dr. Hilbert Bible paged to get pt a stronger nicotine patch. Pt verbalized understanding, continuing to monitor pt.

## 2017-04-14 NOTE — Anesthesia Preprocedure Evaluation (Signed)
Anesthesia Evaluation  Patient identified by MRN, date of birth, ID band Patient awake    Reviewed: Allergy & Precautions, NPO status , Patient's Chart, lab work & pertinent test results  Airway        Dental   Pulmonary Current Smoker,    breath sounds clear to auscultation       Cardiovascular negative cardio ROS   Rhythm:Regular Rate:Normal     Neuro/Psych PSYCHIATRIC DISORDERS Anxiety  Neuromuscular disease    GI/Hepatic Neg liver ROS, PUD,   Endo/Other  negative endocrine ROS  Renal/GU negative Renal ROS     Musculoskeletal   Abdominal   Peds  Hematology   Anesthesia Other Findings   Reproductive/Obstetrics                             Anesthesia Physical Anesthesia Plan  ASA: II  Anesthesia Plan: MAC   Post-op Pain Management:    Induction: Intravenous  PONV Risk Score and Plan:   Airway Management Planned: Simple Face Mask  Additional Equipment:   Intra-op Plan:   Post-operative Plan:   Informed Consent: I have reviewed the patients History and Physical, chart, labs and discussed the procedure including the risks, benefits and alternatives for the proposed anesthesia with the patient or authorized representative who has indicated his/her understanding and acceptance.     Plan Discussed with:   Anesthesia Plan Comments:         Anesthesia Quick Evaluation

## 2017-04-14 NOTE — Progress Notes (Signed)
Brief EGD note:  Mild changes of reflux esophagitis limited to GE junction. Erosive antral gastritis but no evidence of peptic ulcer disease. Antral biopsy taken for routine histology.

## 2017-04-14 NOTE — Op Note (Signed)
[facility] Patient Name: Debbie Santana Procedure Date: 04/14/2017 7:17 AM MRN: [id number] Date of Birth: August 11, 1991 Attending MD: Hildred Laser , MD CSN: [id number] Age: 26 Admit Type: Inpatient Procedure:                Upper GI endoscopy Indications:              Epigastric abdominal pain, Nausea with vomiting Providers:                Hildred Laser, MD, Otis Peak B. Sharon Seller, RN, Aram Candela Referring MD:             Jimmy Picket Arrien, MD Medicines:                Lidocaine spray, Propofol per Anesthesia Complications:            No immediate complications. Estimated Blood Loss:     Estimated blood loss was minimal. Procedure:                Pre-Anesthesia Assessment:                           - Prior to the procedure, a History and Physical                            was performed, and patient medications and                            allergies were reviewed. The patient's tolerance of                            previous anesthesia was also reviewed. The risks                            and benefits of the procedure and the sedation                            options and risks were discussed with the patient.                            All questions were answered, and informed consent                            was obtained. Prior Anticoagulants: The patient                            last took Lovenox (enoxaparin) 2 days prior to the                            procedure. ASA Grade Assessment: I - A normal,                            healthy patient. After reviewing the risks and  benefits, the patient was deemed in satisfactory                            condition to undergo the procedure.                           After obtaining informed consent, the endoscope was                            passed under direct vision. Throughout the                            procedure, the patient's blood pressure, pulse, and                        oxygen saturations were monitored continuously. The                            EG-[id number] ([id number]) scope was introduced through the                            mouth, and advanced to the second part of duodenum.                            The upper GI endoscopy was accomplished without                            difficulty. The patient tolerated the procedure                            well. Scope In: 8:28:23 AM Scope Out: 8:38:48 AM Total Procedure Duration: 0 hours 10 minutes 25 seconds  Findings:      The examined esophagus was normal.      The Z-line was irregular and was found 37 cm from the incisors.      focal erythema and edema noted at GEJ.      A few dispersed erosions were found in the gastric antrum. Biopsies were       taken with a cold forceps for histology.      The exam of the stomach was otherwise normal.      The duodenal bulb and second portion of the duodenum were normal. Impression:               - Normal esophagus.                           - Z-line irregular, 37 cm from the incisors.                           - Mild changes of reflux esophagitis limited to GE                            junction.                           - Erosive gastropathy. Biopsied.                           -  Normal duodenal bulb and second portion of the                            duodenum.                           comment: These findings would not explain patient's                            symptoms of nausea vomiting and severe epigastric                            pain. Moderate Sedation:      Per Anesthesia Care Recommendation:           - Return patient to hospital ward for ongoing care.                           - Cardiac diet today.                           - Change pantoprazole to oral route.                           - Await pathology results.                           - Consider psych evaluation for anxiety management.                           - hCV  antibody( multiple tattoos). Procedure Code(s):        --- Professional ---                           [phone], Esophagogastroduodenoscopy, flexible,                            transoral; with biopsy, single or multiple Diagnosis Code(s):        --- Professional ---                           K22.8, Other specified diseases of esophagus                           K21.0, Gastro-esophageal reflux disease with                            esophagitis                           K31.89, Other diseases of stomach and duodenum                           R10.13, Epigastric pain                           R11.2, Nausea with vomiting, unspecified CPT copyright 2016 [employer]. All rights reserved. The codes documented  in this report are preliminary and upon coder review may  be revised to meet current compliance requirements. Hildred Laser, MD Hildred Laser, MD 04/14/2017 9:05:40 AM This report has been signed electronically. Number of Addenda: 0

## 2017-04-14 NOTE — Transfer of Care (Signed)
Immediate Anesthesia Transfer of Care Note  Patient: Debbie Santana  Procedure(s) Performed: Procedure(s) with comments: ESOPHAGOGASTRODUODENOSCOPY (EGD) WITH PROPOFOL (N/A) BIOPSY - gastric  Patient Location: PACU  Anesthesia Type:MAC  Level of Consciousness: awake, alert , oriented and patient cooperative  Airway & Oxygen Therapy: Patient Spontanous Breathing  Post-op Assessment: Report given to RN and Post -op Vital signs reviewed and stable  Post vital signs: Reviewed and stable  Last Vitals:  Vitals:   04/14/17 0805 04/14/17 0810  BP: 131/87 (!) 135/97  Pulse:    Resp: (!) 23 (!) 21  Temp:      Last Pain:  Vitals:   04/14/17 0745  TempSrc:   PainSc: 3       Patients Stated Pain Goal: 2 (16/55/37 4827)  Complications: No apparent anesthesia complications

## 2017-04-14 NOTE — Progress Notes (Signed)
PROGRESS NOTE    Debbie Santana  VQM:[id number] DOB: 14-Oct-1990 DOA: 04/11/2017 PCP: Patient, No Pcp Per    Brief Narrative:  26 year old female presented with nausea and vomiting and abdominal pain. Patient's known to have chronic back pain and gastric ulcers. Presents with 2 days of intermittent abdominal pain, consistent with nausea, vomiting and diarrhea. Seen in the emergency department 24 hours prior to admission, workup negative. On the physical examination blood pressure 125/70, heart rate 41, respiratory 14, oxygen saturation 100%. Dry mucous membranes colonoscopy to auscultation bilaterally, heart is most impressive abdomen, the abdomen was soft, nondistended; tender to deep palpation, no rebound or guarding. No lower extremity edema. Sodium 141, potassium 3.5, chloride 107, bicarbonate 26, glucose 91, BUN 5, creatinine 0.72, white count 15.5, hemoglobin 13.2, hematocrit 39.2,platelets 221. Urinalysis negative for infection, negative pregnancy test, CT of the abdomen negative. Negative abdominal ultrasound, EKG with normal sinus rhythm.  Working diagnosis. Acute gastroenteritis rule out infectious process, peptic ulcer disease    Assessment & Plan:   Active Problems:   Viral gastroenteritis   Intractable abdominal pain   Leukocytosis   Hyperglycemia   Anxiety   Peptic ulcer    1. Acute gastritis. Patient had endoscopy today with erosive gastritis. Symptoms have improved with antiacid therapy. Tolerating po diet well. Will hold on IV fluids, will continue on IV antiemetics and analgesics. Case discussed with Dr. Laural Golden from GI.   2. Leukocytosis.  Reactive, no signs of systemic infection, wbc at 9.5, continue to hold antibiotic therapy.   3. Anxiety. Episodic anxiety, will continue lorazepam IV as needed. Will need close follow up as outpatient.   4. Tobacco abuse.  Nicotine patch, for smoking cessation.   5. Hypokalemia and hypomagnesemia. Persistent hypokalemia,  will continue repletion with Kcl po and IV 80 meq and follow K this pm, replete magnesium. Renal function continue to be preserved with serum cr at 0,86 with serum bicarbonate at 28.   DVT prophylaxis:enoxaparin  Code Status:full  Family Communication:I spoke with patient's husband at the bedside and all questions were addressed.  Disposition Plan:home    Consultants:    Procedures:  Antimicrobials:     Subjective: Patient is feeling better, nausea and vomiting have improved, no diarrhea, no chest pain or dyspnea. Had EGD this am.   Objective: Vitals:   04/14/17 0845 04/14/17 0905 04/14/17 0910 04/14/17 0915  BP: (!) 126/94   127/87  Pulse: 87 81 68 76  Resp: 15 (!) 21 (!) 25 15  Temp: 98.6 F (37 C)     TempSrc:      SpO2:  100% 99% 98%  Weight:      Height:        Intake/Output Summary (Last 24 hours) at 04/14/17 1321 Last data filed at 04/14/17 0910  Gross per 24 hour  Intake            762.5 ml  Output                0 ml  Net            762.5 ml   Filed Weights   04/11/17 1136  Weight: 88.5 kg (195 lb)    Examination:  General exam: deconditioned E ENT: no gallop or murmur, no icterus  Respiratory system: No wheezing, rales or rhonchi. Respiratory effort normal. Cardiovascular system: S1 & S2 heard, RRR. No JVD, murmurs, rubs, gallops or clicks. No pedal edema. Gastrointestinal system: Abdomen is nondistended, soft and nontender. No  organomegaly or masses felt. Normal bowel sounds heard. Central nervous system: Alert and oriented. No focal neurological deficits. Extremities: Symmetric 5 x 5 power. Skin: No rashes, lesions or ulcers    Data Reviewed: I have personally reviewed following labs and imaging studies  CBC:  Recent Labs Lab 04/10/17 0726 04/11/17 1310 04/12/17 0615 04/13/17 0542 04/14/17 0936  WBC 15.0* 17.5* 15.5* 13.2* 9.5  NEUTROABS  --  14.7*  --  8.7* 5.3  HGB 14.8 14.4 13.2 12.7 14.6  HCT 42.5 41.7 39.2 37.9  42.8  MCV 90.8 91.2 93.1 93.1 92.6  PLT 265 260 221 194 233   Basic Metabolic Panel:  Recent Labs Lab 04/10/17 0726 04/11/17 1310 04/11/17 1946 04/12/17 0615 04/13/17 0542 04/14/17 0936  NA 139 140  --  141 140 138  K 3.5 3.4*  --  3.5 3.0* 2.9*  CL 107 109  --  107 107 101  CO2 22 20*  --  26 26 28   GLUCOSE 111* 156*  --  91 83 92  BUN 6 7  --  5* <5* <5*  CREATININE 0.99 0.95  --  0.72 0.69 0.86  CALCIUM 9.6 9.3  --  9.0 8.7* 9.4  MG  --   --  1.5*  --   --  1.9  PHOS  --   --  2.9  --   --   --    GFR: Estimated Creatinine Clearance: 113.3 mL/min (by C-G formula based on SCr of 0.86 mg/dL). Liver Function Tests:  Recent Labs Lab 04/10/17 0726 04/11/17 1310  AST 18 20  ALT 14 14  ALKPHOS 63 60  BILITOT 0.8 1.1  PROT 6.7 6.8  ALBUMIN 4.2 4.0    Recent Labs Lab 04/10/17 0726 04/11/17 1310  LIPASE 88* 19   No results for input(s): AMMONIA in the last 168 hours. Coagulation Profile: No results for input(s): INR, PROTIME in the last 168 hours. Cardiac Enzymes: No results for input(s): CKTOTAL, CKMB, CKMBINDEX, TROPONINI in the last 168 hours. BNP (last 3 results) No results for input(s): PROBNP in the last 8760 hours. HbA1C: No results for input(s): HGBA1C in the last 72 hours. CBG: No results for input(s): GLUCAP in the last 168 hours. Lipid Profile: No results for input(s): CHOL, HDL, LDLCALC, TRIG, CHOLHDL, LDLDIRECT in the last 72 hours. Thyroid Function Tests: No results for input(s): TSH, T4TOTAL, FREET4, T3FREE, THYROIDAB in the last 72 hours. Anemia Panel: No results for input(s): VITAMINB12, FOLATE, FERRITIN, TIBC, IRON, RETICCTPCT in the last 72 hours. Sepsis Labs:  Recent Labs Lab 04/11/17 1942  LATICACIDVEN 1.4    No results found for this or any previous visit (from the past 240 hour(s)).       Radiology Studies: No results found.      Scheduled Meds: .  HYDROmorphone (DILAUDID) injection  0.5 mg Intravenous Once  .  metoCLOPramide (REGLAN) injection  10 mg Intravenous Q8H  . nicotine  7 mg Transdermal Daily  . pantoprazole  40 mg Oral BID AC   Continuous Infusions: . dextrose 5 % and 0.9% NaCl 75 mL/hr at 04/13/17 2122     LOS: 1 day        Tawni Millers, MD [facility] Pager [phone]  If 7PM-7AM, please contact night-coverage [email or web address] Password [email or web address] 04/14/2017, 1:21 PM

## 2017-04-14 NOTE — Anesthesia Procedure Notes (Signed)
Procedure Name: MAC Date/Time: 04/14/2017 8:20 AM Performed by: Andree Elk, Zacharee Gaddie A Pre-anesthesia Checklist: Patient identified, Emergency Drugs available, Suction available, Patient being monitored and Timeout performed Oxygen Delivery Method: Simple face mask

## 2017-04-14 NOTE — Anesthesia Postprocedure Evaluation (Signed)
Anesthesia Post Note  Patient: Debbie Santana  Procedure(s) Performed: Procedure(s) (LRB): ESOPHAGOGASTRODUODENOSCOPY (EGD) WITH PROPOFOL (N/A) BIOPSY  Patient location during evaluation: PACU Anesthesia Type: MAC Level of consciousness: awake and alert and oriented Pain management: pain level controlled Vital Signs Assessment: post-procedure vital signs reviewed and stable Respiratory status: spontaneous breathing and respiratory function stable Cardiovascular status: stable Anesthetic complications: no     Last Vitals:  Vitals:   04/14/17 0805 04/14/17 0810  BP: 131/87 (!) 135/97  Pulse:    Resp: (!) 23 (!) 21  Temp:      Last Pain:  Vitals:   04/14/17 0745  TempSrc:   PainSc: 3                  ADAMS, AMY A

## 2017-04-14 NOTE — Progress Notes (Signed)
Notified Dr. Cathlean Sauer that I would need to move the time to the potassium lab due to her runs will mot be completed because I can not run them at the ordered rate due to her c/o pain in the IV site.  He stated that it was okay to move the lab time to 2200.  So I modified the time.

## 2017-04-14 NOTE — Progress Notes (Signed)
Dr. Hilbert Bible paged once again, to see if she could order a one time dose of 7mg  patch tonight d/t the 14mg  patch starting in the am, and if given to pt tonight it may make her nauseated again. Waiting for callback/new orders.

## 2017-04-15 ENCOUNTER — Encounter (HOSPITAL_COMMUNITY): Payer: Self-pay

## 2017-04-15 ENCOUNTER — Inpatient Hospital Stay (HOSPITAL_COMMUNITY): Payer: Self-pay

## 2017-04-15 DIAGNOSIS — K279 Peptic ulcer, site unspecified, unspecified as acute or chronic, without hemorrhage or perforation: Secondary | ICD-10-CM

## 2017-04-15 LAB — BASIC METABOLIC PANEL
Anion gap: 6 (ref 5–15)
BUN: 7 mg/dL (ref 6–20)
CALCIUM: 9.4 mg/dL (ref 8.9–10.3)
CHLORIDE: 103 mmol/L (ref 101–111)
CO2: 29 mmol/L (ref 22–32)
CREATININE: 0.92 mg/dL (ref 0.44–1.00)
GFR calc non Af Amer: >60 mL/min (ref >60)
GLUCOSE: 85 mg/dL (ref 65–99)
Potassium: 3.6 mmol/L (ref 3.5–5.1)
Sodium: 138 mmol/L (ref 135–145)

## 2017-04-15 LAB — POTASSIUM: Potassium: 4.1 mmol/L (ref 3.5–5.1)

## 2017-04-15 LAB — HEPATITIS C ANTIBODY: HCV Ab: <0.1 s/co ratio (ref 0.0–0.9)

## 2017-04-15 MED ORDER — TECHNETIUM TC 99M MEBROFENIN IV KIT
5.0000 | PACK | Freq: Once | INTRAVENOUS | Status: AC | PRN
  Administered 2017-04-15: 5.5 via INTRAVENOUS

## 2017-04-15 MED ORDER — LORAZEPAM 0.5 MG PO TABS
0.5000 mg | ORAL_TABLET | Freq: Four times a day (QID) | ORAL | Status: DC | PRN
  Administered 2017-04-15 – 2017-04-17 (×5): 0.5 mg via ORAL
  Filled 2017-04-15 (×5): qty 1

## 2017-04-15 MED ORDER — DICYCLOMINE HCL 10 MG PO CAPS
10.0000 mg | ORAL_CAPSULE | Freq: Once | ORAL | Status: AC
  Administered 2017-04-15: 10 mg via ORAL
  Filled 2017-04-15: qty 1

## 2017-04-15 MED ORDER — FLEET ENEMA 7-19 GM/118ML RE ENEM
1.0000 | ENEMA | Freq: Once | RECTAL | Status: AC
  Administered 2017-04-15: 1 via RECTAL

## 2017-04-15 MED ORDER — HYDROMORPHONE HCL 1 MG/ML IJ SOLN
0.5000 mg | Freq: Once | INTRAMUSCULAR | Status: AC
  Administered 2017-04-15: 0.5 mg via INTRAVENOUS
  Filled 2017-04-15: qty 1

## 2017-04-15 MED ORDER — DICYCLOMINE HCL 10 MG PO CAPS
10.0000 mg | ORAL_CAPSULE | Freq: Three times a day (TID) | ORAL | Status: DC
  Administered 2017-04-16 – 2017-04-18 (×5): 10 mg via ORAL
  Filled 2017-04-15 (×5): qty 1

## 2017-04-15 MED ORDER — DICYCLOMINE HCL 10 MG PO CAPS: 10.0000 mg | ORAL_CAPSULE | Freq: Three times a day (TID) | ORAL | Status: DC

## 2017-04-15 MED ORDER — POLYETHYLENE GLYCOL 3350 17 G PO PACK
17.0000 g | PACK | Freq: Once | ORAL | Status: AC
  Administered 2017-04-15: 17 g via ORAL
  Filled 2017-04-15: qty 1

## 2017-04-15 NOTE — Progress Notes (Signed)
PROGRESS NOTE    LYZBETH GENRICH  AQT:[id number] DOB: Jun 06, 1991 DOA: 04/11/2017 PCP: Patient, No Pcp Per    Brief Narrative:  26 year old female presented with nausea and vomiting and abdominal pain. Patient's known to have chronic back pain and gastric ulcers. Presents with 2 days of intermittent abdominal pain, consistent with nausea, vomiting and diarrhea. Seen in the emergency department 24 hours prior to admission, workup negative. On the physical examination blood pressure 125/70, heart rate 41, respiratory 14, oxygen saturation 100%. Dry mucous membranes colonoscopy to auscultation bilaterally, heart is most impressive abdomen, the abdomen was soft, nondistended; tender to deep palpation, no rebound or guarding. No lower extremity edema. Sodium 141, potassium 3.5, chloride 107, bicarbonate 26, glucose 91, BUN 5, creatinine 0.72, white count 15.5, hemoglobin 13.2, hematocrit 39.2,platelets 221. Urinalysis negative for infection, negative pregnancy test, CT of the abdomen negative. Negative abdominal ultrasound, EKG with normal sinus rhythm.  Working diagnosis. Acute gastroenteritis rule out infectious process, peptic ulcer disease    Assessment & Plan:   Active Problems:   Viral gastroenteritis   Intractable abdominal pain   Leukocytosis   Hyperglycemia   Anxiety   Peptic ulcer   1. Acute gastritis. Endoscopy with erosive gastritis, continue antiacid therapy. Tolerating po diet well. Follow with HIDA scan results, per GI recommendations. If continue to tolerate po, possible discharge in am.   2. Leukocytosis. It has resolved.   3. Anxiety. Improved anxiety, on lorazepam, will change to po.   4. Tobacco abuse.  smoking cessation.   5. Hypokalemia and hypomagnesemia. Serum K up to 3.6 will follow renal panel in am, patient tolerating po well.   DVT prophylaxis:enoxaparin  Code Status:full  Family Communication:I spoke with patient's husband at the bedside and all  questions were addressed.  Disposition Plan:home    Consultants:    Procedures:  Antimicrobials:     Subjective: Patient feeling better, improved nausea and abdominal pain, for HIDA today,   Objective: Vitals:   04/14/17 0915 04/14/17 1300 04/14/17 2130 04/15/17 0422  BP: 127/87 112/69 115/80 114/66  Pulse: 76 96 73 65  Resp: 15 20 18 16   Temp:  98.5 F (36.9 C) 99 F (37.2 C) 98.5 F (36.9 C)  TempSrc:  Oral Oral Oral  SpO2: 98% 99% 99% 100%  Weight:      Height:        Intake/Output Summary (Last 24 hours) at 04/15/17 1122 Last data filed at 04/15/17 0630  Gross per 24 hour  Intake              800 ml  Output                0 ml  Net              800 ml   Filed Weights   04/11/17 1136  Weight: 88.5 kg (195 lb)    Examination:  General exam: not in pain or dyspnea E ENT; no pallor or icterus Respiratory system: No wheezing, rales or rhonchi.  Respiratory effort normal. Cardiovascular system: S1 & S2 heard, RRR. No JVD, murmurs, rubs, gallops or clicks. No pedal edema. Gastrointestinal system: Abdomen is nondistended, soft and nontender. No organomegaly or masses felt. Normal bowel sounds heard. Central nervous system: Alert and oriented. No focal neurological deficits. Extremities: Symmetric 5 x 5 power. Skin: No rashes, lesions or ulcers     Data Reviewed: I have personally reviewed following labs and imaging studies  CBC:  Recent Labs Lab  04/10/17 0726 04/11/17 1310 04/12/17 0615 04/13/17 0542 04/14/17 0936  WBC 15.0* 17.5* 15.5* 13.2* 9.5  NEUTROABS  --  14.7*  --  8.7* 5.3  HGB 14.8 14.4 13.2 12.7 14.6  HCT 42.5 41.7 39.2 37.9 42.8  MCV 90.8 91.2 93.1 93.1 92.6  PLT 265 260 221 194 683   Basic Metabolic Panel:  Recent Labs Lab 04/11/17 1310 04/11/17 1946 04/12/17 0615 04/13/17 0542 04/14/17 0936 04/15/17 0003 04/15/17 0609  NA 140  --  141 140 138  --  138  K 3.4*  --  3.5 3.0* 2.9* 4.1 3.6  CL 109  --  107 107  101  --  103  CO2 20*  --  26 26 28   --  29  GLUCOSE 156*  --  91 83 92  --  85  BUN 7  --  5* <5* <5*  --  7  CREATININE 0.95  --  0.72 0.69 0.86  --  0.92  CALCIUM 9.3  --  9.0 8.7* 9.4  --  9.4  MG  --  1.5*  --   --  1.9  --   --   PHOS  --  2.9  --   --   --   --   --    GFR: Estimated Creatinine Clearance: 105.9 mL/min (by C-G formula based on SCr of 0.92 mg/dL). Liver Function Tests:  Recent Labs Lab 04/10/17 0726 04/11/17 1310  AST 18 20  ALT 14 14  ALKPHOS 63 60  BILITOT 0.8 1.1  PROT 6.7 6.8  ALBUMIN 4.2 4.0    Recent Labs Lab 04/10/17 0726 04/11/17 1310  LIPASE 88* 19   No results for input(s): AMMONIA in the last 168 hours. Coagulation Profile: No results for input(s): INR, PROTIME in the last 168 hours. Cardiac Enzymes: No results for input(s): CKTOTAL, CKMB, CKMBINDEX, TROPONINI in the last 168 hours. BNP (last 3 results) No results for input(s): PROBNP in the last 8760 hours. HbA1C: No results for input(s): HGBA1C in the last 72 hours. CBG: No results for input(s): GLUCAP in the last 168 hours. Lipid Profile: No results for input(s): CHOL, HDL, LDLCALC, TRIG, CHOLHDL, LDLDIRECT in the last 72 hours. Thyroid Function Tests: No results for input(s): TSH, T4TOTAL, FREET4, T3FREE, THYROIDAB in the last 72 hours. Anemia Panel: No results for input(s): VITAMINB12, FOLATE, FERRITIN, TIBC, IRON, RETICCTPCT in the last 72 hours. Sepsis Labs:  Recent Labs Lab 04/11/17 1942  LATICACIDVEN 1.4    No results found for this or any previous visit (from the past 240 hour(s)).       Radiology Studies: No results found.      Scheduled Meds: .  HYDROmorphone (DILAUDID) injection  0.5 mg Intravenous Once  . metoCLOPramide (REGLAN) injection  10 mg Intravenous Q8H  . nicotine  14 mg Transdermal Daily  . nicotine  7 mg Transdermal Once  . pantoprazole  40 mg Oral BID AC   Continuous Infusions:   LOS: 2 days      Mauricio Gerome Apley,  MD [facility] Pager [phone]  If 7PM-7AM, please contact night-coverage [email or web address] Password [email or web address] 04/15/2017, 11:22 AM

## 2017-04-15 NOTE — Progress Notes (Signed)
  Gastric biopsy negative for H. Pylori. HCV Ab negative. HIDA scan reveals EF of 10%. She did develop abdominal pain and nausea with an sure.  All results reviewed with patient. She is now complaining of constipation. Will give her fleets enema now and polyethylene glycol and bedtime. Begin dicyclomine 10 mg before meals. First dose today. Consider surgical consultation.

## 2017-04-15 NOTE — Progress Notes (Signed)
Pts fiance very agitated about pt not getting any better since Friday. He stated, " do these doctors not know what they are doing?" RN educated pt on fiance of plan of care, results of tests and pain mgmt. RN explained that pt had no nausea and vomiting for 2 days as of my shift Monday and her pain was ranging a 5/10 all shift. Pt agreed that she felt she was doing better and now feels like she has taken a step back. RN once again went over results with pt, fiance states he is coming up here early tomorrow to talk with MD about plan of care.

## 2017-04-15 NOTE — Progress Notes (Addendum)
Pt c/o pain, and stated that after eating graham crackers and peanut butter pain spiked 8/10. Pt states not tolerating po diet at all and haven't eaten anything since Thursday except a few bit of a couple meals.  Pain medication and Zofran given, will continue to monitor pt.

## 2017-04-15 NOTE — Clinical Social Work Note (Signed)
LCSW spoke with patient via phone and provided information related to [facility] Sant[facility] walk in clinic.  LCSW also added this information to patient's discharge paper work.       LCSW signing off.       Bryten Maher, Clydene Pugh, LCSW

## 2017-04-15 NOTE — Plan of Care (Signed)
Problem: Pain Managment: Goal: General experience of comfort will improve Outcome: Not Progressing Pt continues to c/o abdominal pain through out the shift but has no tenderness of abdomen.Pt receiving Dilaudid q4h. Pt educated on pain mgmt as well as medications available. Pt verbalized understanding

## 2017-04-15 NOTE — Progress Notes (Signed)
  Subjective:  Patient complains of nausea heartburn and right upper quadrant abdominal pain. She has not experienced vomiting since EGD yesterday. She says pain radiates into her back.  Objective: Blood pressure 114/66, pulse 65, temperature 98.5 F (36.9 C), temperature source Oral, resp. rate 16, height 5\' 7"  (1.702 m), weight 195 lb (88.5 kg), last menstrual period 03/31/2017, SpO2 100 %. Patient is alert and in no acute distress. Abdomen is symmetrical and soft with mild tenderness below the right costal margin.  Labs/studies Results:   Recent Labs  04/13/17 0542 04/14/17 0936  WBC 13.2* 9.5  HGB 12.7 14.6  HCT 37.9 42.8  PLT 194 221    BMET   Recent Labs  04/13/17 0542 04/14/17 0936 04/15/17 0003 04/15/17 0609  NA 140 138  --  138  K 3.0* 2.9* 4.1 3.6  CL 107 101  --  103  CO2 26 28  --  29  GLUCOSE 83 92  --  85  BUN <5* <5*  --  7  CREATININE 0.69 0.86  --  0.92  CALCIUM 8.7* 9.4  --  9.4    LFT  No results for input(s): PROT, ALBUMIN, AST, ALT, ALKPHOS, BILITOT, BILIDIR, IBILI in the last 72 hours.  PT/INR  No results for input(s): LABPROT, INR in the last 72 hours.  Hepatitis Panel   Recent Labs  04/14/17 0936  HCVAB <0.1     Assessment:  #1. Right upper quadrant abdominal pain. EGD yesterday was negative for peptic ulcer disease. Ultrasound earlier was negative for cholelithiasis.  #2. GERD.  #3. Erosive gastritis. Gastric biopsies pending.  #4. HCV antibody is negative.   Recommendations:  HIDA scan with CCK. Patient was advised not to take pain medication until after the past.

## 2017-04-16 ENCOUNTER — Encounter (HOSPITAL_COMMUNITY): Payer: Self-pay | Admitting: Internal Medicine

## 2017-04-16 DIAGNOSIS — K811 Chronic cholecystitis: Principal | ICD-10-CM | POA: Diagnosis present

## 2017-04-16 LAB — BASIC METABOLIC PANEL
ANION GAP: 9 (ref 5–15)
BUN: 10 mg/dL (ref 6–20)
CALCIUM: 9.6 mg/dL (ref 8.9–10.3)
CHLORIDE: 100 mmol/L — AB (ref 101–111)
CO2: 29 mmol/L (ref 22–32)
Creatinine, Ser: 0.89 mg/dL (ref 0.44–1.00)
GFR calc non Af Amer: >60 mL/min (ref >60)
Glucose, Bld: 87 mg/dL (ref 65–99)
Potassium: 3.7 mmol/L (ref 3.5–5.1)
Sodium: 138 mmol/L (ref 135–145)

## 2017-04-16 MED ORDER — CIPROFLOXACIN IN D5W 400 MG/200ML IV SOLN
400.0000 mg | INTRAVENOUS | Status: AC
  Administered 2017-04-17: 400 mg via INTRAVENOUS
  Filled 2017-04-16: qty 200

## 2017-04-16 MED ORDER — CHLORHEXIDINE GLUCONATE CLOTH 2 % EX PADS
6.0000 | MEDICATED_PAD | Freq: Once | CUTANEOUS | Status: AC
  Administered 2017-04-17: 6 via TOPICAL

## 2017-04-16 MED ORDER — CYCLOBENZAPRINE HCL 10 MG PO TABS
5.0000 mg | ORAL_TABLET | Freq: Once | ORAL | Status: AC
  Administered 2017-04-16: 5 mg via ORAL
  Filled 2017-04-16: qty 1

## 2017-04-16 MED ORDER — CHLORHEXIDINE GLUCONATE CLOTH 2 % EX PADS
6.0000 | MEDICATED_PAD | Freq: Once | CUTANEOUS | Status: AC
  Administered 2017-04-16: 6 via TOPICAL

## 2017-04-16 NOTE — Progress Notes (Addendum)
[facility] PROGRESS NOTE    Progress Note  Debbie Santana  TKW:[id number] DOB: [birth year] DOA: 04/11/2017 PCP: Patient, No Pcp Per     Brief Narrative:   Debbie Santana is an 26 y.o. female past medical history of gastric ulcers presents with nausea and vomiting , abdominal pain that started 2 days prior to admission.  Assessment/Plan:   Acute gastritis: Endoscopy performed on 04/14/2017 showed erosive gastritis. GI recommended HIDA which showed a decreased ejection fraction. Surgery consulted who rec lap choli.  Leukocytosis: Resolved has remained afebrile.  Anxiety Continue lorazepam.  Hypokalemia/hypomagnesemia: Improved with oral supplementation.   DVT prophylaxis: SCD Family Communication:None Disposition Plan/Barrier to D/C: unable to determine Code Status:     Code Status Orders        Start     Ordered   04/14/17 1120  Full code  Continuous     04/14/17 1120    Code Status History    Date Active Date Inactive Code Status Order ID Comments User Context   04/11/2017  7:09 PM 04/14/2017 11:20 AM Full Code [id number]  Waldemar Dickens, MD ED        IV Access:    Peripheral IV   Procedures and diagnostic studies:   Nm Hepato W/eject Fract  Result Date: 04/15/2017 CLINICAL DATA:  RIGHT upper quadrant abdominal pain for 4 months, nausea, vomiting, worsened symptoms with food EXAM: NUCLEAR MEDICINE HEPATOBILIARY IMAGING WITH GALLBLADDER EF TECHNIQUE: Sequential images of the abdomen were obtained out to 60 minutes following intravenous administration of radiopharmaceutical. After oral ingestion of Ensure, gallbladder ejection fraction was determined. At 60 min, normal ejection fraction is greater than 33%. RADIOPHARMACEUTICALS:  5.5 mCi Tc-31m Choletec IV COMPARISON:  None FINDINGS: Normal tracer extraction from bloodstream indicating normal hepatocellular function. Normal excretion of tracer into biliary tree. Gallbladder visualized at 18 min. Small  bowel visualized at 23 min. No hepatic retention of tracer. Suggests a week decrease emptying of tracer from gallbladder following fatty meal stimulation. Calculated gallbladder ejection fraction is 15%, abnormally low. Patient reported abdominal pain following Ensure ingestion. It should be noted that the patient received Dilaudid approximately 6 hours prior to imaging. Normal gallbladder ejection fraction following Ensure ingestion is greater than 33% at 1 hour. IMPRESSION: Patent biliary tree. Abnormal gallbladder response to fatty meal stimulation with a decreased gallbladder ejection fraction of 15% as discussed above. Electronically Signed   By: Lavonia Dana M.D.   On: 04/15/2017 15:13     Medical Consultants:    None.  Anti-Infectives:   None  Subjective:    Debbie Santana she is tolerating her diet.  Objective:    Vitals:   04/15/17 1630 04/15/17 1950 04/15/17 2205 04/16/17 0500  BP: (!) 149/89  (!) 135/91 (!) 97/54  Pulse: 96  90 80  Resp: 18  18 18   Temp: 99.2 F (37.3 C)  98.8 F (37.1 C) 98.6 F (37 C)  TempSrc: Oral  Oral Oral  SpO2: 98% 98% 100% 99%  Weight:      Height:        Intake/Output Summary (Last 24 hours) at 04/16/17 0928 Last data filed at 04/16/17 9622  Gross per 24 hour  Intake             1040 ml  Output                0 ml  Net             1040  ml   Filed Weights   04/11/17 1136  Weight: 88.5 kg (195 lb)    Exam: General exam: No acute distress Respiratory system: Good air movement clear to auscultation. Cardiovascular system: Rate and rhythm with positive S1-S2 no murmurs or gallops. Gastrointestinal system: Positive bowel sounds soft nontender nondistended. Central nervous system: Awake alert and oriented 3 Extremities: No lower extremity edema. Skin: No rashes. Psychiatry: Judgment and insight appeared normal.   Data Reviewed:    Labs: Basic Metabolic Panel:  Recent Labs Lab 04/11/17 1946 04/12/17 0615  04/13/17 0542 04/14/17 0936  04/15/17 0609 04/16/17 0546  NA  --  141 140 138  --  138 138  K  --  3.5 3.0* 2.9*  < > 3.6 3.7  CL  --  107 107 101  --  103 100*  CO2  --  26 26 28   --  29 29  GLUCOSE  --  91 83 92  --  85 87  BUN  --  5* <5* <5*  --  7 10  CREATININE  --  0.72 0.69 0.86  --  0.92 0.89  CALCIUM  --  9.0 8.7* 9.4  --  9.4 9.6  MG 1.5*  --   --  1.9  --   --   --   PHOS 2.9  --   --   --   --   --   --   < > = values in this interval not displayed. GFR Estimated Creatinine Clearance: 109.5 mL/min (by C-G formula based on SCr of 0.89 mg/dL). Liver Function Tests:  Recent Labs Lab 04/10/17 0726 04/11/17 1310  AST 18 20  ALT 14 14  ALKPHOS 63 60  BILITOT 0.8 1.1  PROT 6.7 6.8  ALBUMIN 4.2 4.0    Recent Labs Lab 04/10/17 0726 04/11/17 1310  LIPASE 88* 19   No results for input(s): AMMONIA in the last 168 hours. Coagulation profile No results for input(s): INR, PROTIME in the last 168 hours.  CBC:  Recent Labs Lab 04/10/17 0726 04/11/17 1310 04/12/17 0615 04/13/17 0542 04/14/17 0936  WBC 15.0* 17.5* 15.5* 13.2* 9.5  NEUTROABS  --  14.7*  --  8.7* 5.3  HGB 14.8 14.4 13.2 12.7 14.6  HCT 42.5 41.7 39.2 37.9 42.8  MCV 90.8 91.2 93.1 93.1 92.6  PLT 265 260 221 194 221   Cardiac Enzymes: No results for input(s): CKTOTAL, CKMB, CKMBINDEX, TROPONINI in the last 168 hours. BNP (last 3 results) No results for input(s): PROBNP in the last 8760 hours. CBG: No results for input(s): GLUCAP in the last 168 hours. D-Dimer: No results for input(s): DDIMER in the last 72 hours. Hgb A1c: No results for input(s): HGBA1C in the last 72 hours. Lipid Profile: No results for input(s): CHOL, HDL, LDLCALC, TRIG, CHOLHDL, LDLDIRECT in the last 72 hours. Thyroid function studies: No results for input(s): TSH, T4TOTAL, T3FREE, THYROIDAB in the last 72 hours.  Invalid input(s): FREET3 Anemia work up: No results for input(s): VITAMINB12, FOLATE, FERRITIN, TIBC,  IRON, RETICCTPCT in the last 72 hours. Sepsis Labs:  Recent Labs Lab 04/11/17 1310 04/11/17 1942 04/12/17 0615 04/13/17 0542 04/14/17 0936  WBC 17.5*  --  15.5* 13.2* 9.5  LATICACIDVEN  --  1.4  --   --   --    Microbiology No results found for this or any previous visit (from the past 240 hour(s)).   Medications:   . dicyclomine  10 mg Oral TID AC  .  HYDROmorphone (DILAUDID) injection  0.5 mg Intravenous Once  . metoCLOPramide (REGLAN) injection  10 mg Intravenous Q8H  . nicotine  14 mg Transdermal Daily  . pantoprazole  40 mg Oral BID AC   Continuous Infusions:    LOS: 3 days   Charlynne Cousins  [facility] Pager [phone]  *Please refer to [email or web address], password [email or web address] to get updated schedule on who will round on this patient, as hospitalists switch teams weekly. If 7PM-7AM, please contact night-coverage at [email or web address], password [email or web address] for any overnight needs.  04/16/2017, 9:28 AM

## 2017-04-16 NOTE — Plan of Care (Signed)
Problem: Pain Managment: Goal: General experience of comfort will improve Outcome: Not Progressing Pt continues to c/o abdominal pain. Pt states she has 8/10 pain after eating graham crackers and pb, Dilaudid given as well as Zofran. Pt educated on pain mgmt, Plan of care, and medications available. Will continue to monitor pt

## 2017-04-16 NOTE — Consult Note (Signed)
Reason for Consult: Chronic cholecystitis Referring Physician: Dr. Keenan Bachelor is an 26 y.o. female.  HPI: Patient is a 26 year old white female who presents with worsening upper abdominal pain, nausea, and vomiting. She does have a history of gastric ulcers. She has been having severe epigastric pain with nausea and vomiting intermittently for several months. She presented to the emergency room and was admitted tape and hospital for further evaluation treatment. Ultrasound gallbladder has been negative for cholelithiasis. CT scan of the abdomen and pelvis were unremarkable. She had a HIDA scan which revealed a low gallbladder ejection fraction with reproducible symptoms. A surgery consultation has been obtained for possible cholecystectomy. Patient currently has nausea. Her abdominal pain is 4 out of 10. She denies any fever, chills, jaundice.  Past Medical History:  Diagnosis Date  . Chronic back pain   . Gastric ulcer   . Sciatica     Past Surgical History:  Procedure Laterality Date  . BACK SURGERY    . HERNIA REPAIR      Family History  Problem Relation Age of Onset  . Anxiety disorder Mother     Social History:  reports that she has been smoking Cigarettes.  She has been smoking about 1.00 pack per day. She has never used smokeless tobacco. She reports that she uses drugs, including Marijuana. She reports that she does not drink alcohol.  Allergies: No Known Allergies  Medications:  Scheduled: . dicyclomine  10 mg Oral TID AC  .  HYDROmorphone (DILAUDID) injection  0.5 mg Intravenous Once  . metoCLOPramide (REGLAN) injection  10 mg Intravenous Q8H  . nicotine  14 mg Transdermal Daily  . pantoprazole  40 mg Oral BID AC    Results for orders placed or performed during the hospital encounter of 04/11/17 (from the past 48 hour(s))  Potassium     Status: None   Collection Time: 04/15/17 12:03 AM  Result Value Ref Range   Potassium 4.1 3.5 - 5.1 mmol/L    Comment:  DELTA CHECK NOTED  Basic metabolic panel     Status: None   Collection Time: 04/15/17  6:09 AM  Result Value Ref Range   Sodium 138 135 - 145 mmol/L   Potassium 3.6 3.5 - 5.1 mmol/L   Chloride 103 101 - 111 mmol/L   CO2 29 22 - 32 mmol/L   Glucose, Bld 85 65 - 99 mg/dL   BUN 7 6 - 20 mg/dL   Creatinine, Ser 0.92 0.44 - 1.00 mg/dL   Calcium 9.4 8.9 - 10.3 mg/dL   GFR calc non Af Amer >60 >60 mL/min   GFR calc Af Amer >60 >60 mL/min    Comment: (NOTE) The eGFR has been calculated using the CKD EPI equation. This calculation has not been validated in all clinical situations. eGFR's persistently <60 mL/min signify possible Chronic Kidney Disease.    Anion gap 6 5 - 15  Basic metabolic panel     Status: Abnormal   Collection Time: 04/16/17  5:46 AM  Result Value Ref Range   Sodium 138 135 - 145 mmol/L   Potassium 3.7 3.5 - 5.1 mmol/L   Chloride 100 (L) 101 - 111 mmol/L   CO2 29 22 - 32 mmol/L   Glucose, Bld 87 65 - 99 mg/dL   BUN 10 6 - 20 mg/dL   Creatinine, Ser 0.89 0.44 - 1.00 mg/dL   Calcium 9.6 8.9 - 10.3 mg/dL   GFR calc non Af Amer >60 >60  mL/min   GFR calc Af Amer >60 >60 mL/min    Comment: (NOTE) The eGFR has been calculated using the CKD EPI equation. This calculation has not been validated in all clinical situations. eGFR's persistently <60 mL/min signify possible Chronic Kidney Disease.    Anion gap 9 5 - 15    Nm Hepato W/eject Fract  Result Date: 04/15/2017 CLINICAL DATA:  RIGHT upper quadrant abdominal pain for 4 months, nausea, vomiting, worsened symptoms with food EXAM: NUCLEAR MEDICINE HEPATOBILIARY IMAGING WITH GALLBLADDER EF TECHNIQUE: Sequential images of the abdomen were obtained out to 60 minutes following intravenous administration of radiopharmaceutical. After oral ingestion of Ensure, gallbladder ejection fraction was determined. At 60 min, normal ejection fraction is greater than 33%. RADIOPHARMACEUTICALS:  5.5 mCi Tc-37mCholetec IV COMPARISON:   None FINDINGS: Normal tracer extraction from bloodstream indicating normal hepatocellular function. Normal excretion of tracer into biliary tree. Gallbladder visualized at 18 min. Small bowel visualized at 23 min. No hepatic retention of tracer. Suggests a week decrease emptying of tracer from gallbladder following fatty meal stimulation. Calculated gallbladder ejection fraction is 15%, abnormally low. Patient reported abdominal pain following Ensure ingestion. It should be noted that the patient received Dilaudid approximately 6 hours prior to imaging. Normal gallbladder ejection fraction following Ensure ingestion is greater than 33% at 1 hour. IMPRESSION: Patent biliary tree. Abnormal gallbladder response to fatty meal stimulation with a decreased gallbladder ejection fraction of 15% as discussed above. Electronically Signed   By: MLavonia DanaM.D.   On: 04/15/2017 15:13    ROS:  Pertinent items are noted in HPI.  Blood pressure (!) 97/54, pulse 80, temperature 98.6 F (37 C), temperature source Oral, resp. rate 18, height 5' 7"  (1.702 m), weight 195 lb (88.5 kg), last menstrual period 04/05/2017, SpO2 99 %. Physical Exam: Pleasant well-developed well-nourished white female in no acute distress Head is normocephalic, atraumatic Eyes are without scleral icterus Lungs clear auscultation with equal breath sounds bilaterally Heart examination reveals a regular rate and rhythm without S3, S4, murmurs Abdomen is soft with some discomfort to palpation in the right upper quadrant. No hepatosplenomegaly, masses, hernias identified. No rigidity is noted.  Dr. ROlevia Perchesnotes and results reviewed  Assessment/Plan: Chronic cholecystitis Plan: Patient will undergo laparoscopic cholecystectomy on 04/17/2017. The risks and benefits of the procedure including bleeding, infection, hepatobiliary injury, and the possibility of an open procedure were fully explained to the patient, who gave informed consent.  MAviva Signs7/25/2018, 10:07 AM

## 2017-04-16 NOTE — Progress Notes (Addendum)
Patient ID: Debbie Santana, female   DOB: November 26, 1990, 26 y.o.   MRN: [id number] States she feels 50% better. Continues to have nausea with meals but no vomiting.  Continues to have pain with meals. Will order a surgical consult with Dr. Arnoldo Morale. She did have an abnormal HIDA scan.    GI attending note: Patient continues to complain of nausea and right upper quadrant pain. She has been evaluated by Dr. Arnoldo Morale and scheduled to undergo cholecystectomy tomorrow.

## 2017-04-17 ENCOUNTER — Encounter (HOSPITAL_COMMUNITY)
Admission: EM | Disposition: A | Discharge status: Home / Self Care | Payer: Self-pay | Source: Home / Self Care | Attending: Internal Medicine

## 2017-04-17 ENCOUNTER — Inpatient Hospital Stay (HOSPITAL_COMMUNITY): Payer: Self-pay | Admitting: Anesthesiology

## 2017-04-17 ENCOUNTER — Encounter (HOSPITAL_COMMUNITY): Payer: Self-pay | Admitting: Internal Medicine

## 2017-04-17 DIAGNOSIS — K2100 Gastro-esophageal reflux disease with esophagitis, without bleeding: Secondary | ICD-10-CM | POA: Diagnosis present

## 2017-04-17 DIAGNOSIS — E669 Obesity, unspecified: Secondary | ICD-10-CM

## 2017-04-17 DIAGNOSIS — R001 Bradycardia, unspecified: Secondary | ICD-10-CM | POA: Diagnosis present

## 2017-04-17 DIAGNOSIS — K2960 Other gastritis without bleeding: Secondary | ICD-10-CM

## 2017-04-17 DIAGNOSIS — R1011 Right upper quadrant pain: Secondary | ICD-10-CM

## 2017-04-17 DIAGNOSIS — Z720 Tobacco use: Secondary | ICD-10-CM | POA: Diagnosis present

## 2017-04-17 HISTORY — DX: Obesity, unspecified: E66.9

## 2017-04-17 HISTORY — DX: Bradycardia, unspecified: R00.1

## 2017-04-17 HISTORY — PX: CHOLECYSTECTOMY: SHX55

## 2017-04-17 SURGERY — LAPAROSCOPIC CHOLECYSTECTOMY
Anesthesia: General

## 2017-04-17 MED ORDER — PROMETHAZINE HCL 25 MG/ML IJ SOLN: 12.5000 mg | Freq: Once | INTRAMUSCULAR | Status: DC

## 2017-04-17 MED ORDER — PROPOFOL 10 MG/ML IV BOLUS
INTRAVENOUS | Status: DC | PRN
  Administered 2017-04-17: 160 mg via INTRAVENOUS

## 2017-04-17 MED ORDER — SUCCINYLCHOLINE 20MG/ML (10ML) SYRINGE FOR MEDFUSION PUMP - OPTIME
INTRAMUSCULAR | Status: DC | PRN
  Administered 2017-04-17: 120 mg via INTRAVENOUS

## 2017-04-17 MED ORDER — LIDOCAINE HCL (CARDIAC) 10 MG/ML IV SOLN
INTRAVENOUS | Status: DC | PRN
  Administered 2017-04-17: 50 mg via INTRAVENOUS

## 2017-04-17 MED ORDER — HYDROMORPHONE HCL 1 MG/ML IJ SOLN
0.2500 mg | INTRAMUSCULAR | Status: DC | PRN
  Administered 2017-04-17 (×3): 0.5 mg via INTRAVENOUS
  Filled 2017-04-17: qty 1

## 2017-04-17 MED ORDER — DEXAMETHASONE SODIUM PHOSPHATE 4 MG/ML IJ SOLN
4.0000 mg | Freq: Once | INTRAMUSCULAR | Status: AC
  Administered 2017-04-17: 4 mg via INTRAVENOUS

## 2017-04-17 MED ORDER — PROMETHAZINE HCL 25 MG/ML IJ SOLN
INTRAMUSCULAR | Status: AC
  Filled 2017-04-17: qty 1

## 2017-04-17 MED ORDER — HYDROMORPHONE HCL 1 MG/ML IJ SOLN
0.2500 mg | INTRAMUSCULAR | Status: DC | PRN
  Administered 2017-04-17 (×4): 0.5 mg via INTRAVENOUS
  Filled 2017-04-17: qty 1

## 2017-04-17 MED ORDER — FENTANYL CITRATE (PF) 250 MCG/5ML IJ SOLN
INTRAMUSCULAR | Status: AC
  Filled 2017-04-17: qty 5

## 2017-04-17 MED ORDER — SUCCINYLCHOLINE CHLORIDE 20 MG/ML IJ SOLN
INTRAMUSCULAR | Status: AC
  Filled 2017-04-17: qty 1

## 2017-04-17 MED ORDER — GLYCOPYRROLATE 0.2 MG/ML IJ SOLN
INTRAMUSCULAR | Status: AC
  Filled 2017-04-17: qty 4

## 2017-04-17 MED ORDER — HYDROCODONE-ACETAMINOPHEN 5-325 MG PO TABS
1.0000 | ORAL_TABLET | ORAL | Status: DC | PRN
  Administered 2017-04-18: 2 via ORAL
  Filled 2017-04-17: qty 2

## 2017-04-17 MED ORDER — SCOPOLAMINE 1 MG/3DAYS TD PT72
1.0000 | MEDICATED_PATCH | Freq: Once | TRANSDERMAL | Status: DC
  Administered 2017-04-17: 1.5 mg via TRANSDERMAL

## 2017-04-17 MED ORDER — LACTATED RINGERS IV SOLN
INTRAVENOUS | Status: DC
  Administered 2017-04-17 – 2017-04-18 (×2): via INTRAVENOUS

## 2017-04-17 MED ORDER — FENTANYL CITRATE (PF) 100 MCG/2ML IJ SOLN
INTRAMUSCULAR | Status: AC
  Filled 2017-04-17: qty 2

## 2017-04-17 MED ORDER — NEOSTIGMINE METHYLSULFATE 10 MG/10ML IV SOLN
INTRAVENOUS | Status: AC
  Filled 2017-04-17: qty 1

## 2017-04-17 MED ORDER — PROMETHAZINE HCL 25 MG/ML IJ SOLN
12.5000 mg | Freq: Once | INTRAMUSCULAR | Status: AC
  Administered 2017-04-17: 12.5 mg via INTRAVENOUS

## 2017-04-17 MED ORDER — SODIUM CHLORIDE 0.9 % IR SOLN
Status: DC | PRN
  Administered 2017-04-17: 1

## 2017-04-17 MED ORDER — ONDANSETRON HCL 4 MG/2ML IJ SOLN: 4.0000 mg | Freq: Once | INTRAMUSCULAR | Status: DC

## 2017-04-17 MED ORDER — KETOROLAC TROMETHAMINE 30 MG/ML IJ SOLN
INTRAMUSCULAR | Status: AC
  Filled 2017-04-17: qty 1

## 2017-04-17 MED ORDER — BUPIVACAINE HCL (PF) 0.5 % IJ SOLN
INTRAMUSCULAR | Status: DC | PRN
  Administered 2017-04-17: 10 mL

## 2017-04-17 MED ORDER — LIDOCAINE HCL (PF) 1 % IJ SOLN
INTRAMUSCULAR | Status: AC
  Filled 2017-04-17: qty 5

## 2017-04-17 MED ORDER — FENTANYL CITRATE (PF) 100 MCG/2ML IJ SOLN
INTRAMUSCULAR | Status: DC | PRN
  Administered 2017-04-17 (×3): 50 ug via INTRAVENOUS
  Administered 2017-04-17: 100 ug via INTRAVENOUS
  Administered 2017-04-17 (×4): 50 ug via INTRAVENOUS

## 2017-04-17 MED ORDER — SIMETHICONE 80 MG PO CHEW: 40.0000 mg | CHEWABLE_TABLET | Freq: Four times a day (QID) | ORAL | Status: DC | PRN

## 2017-04-17 MED ORDER — HEMOSTATIC AGENTS (NO CHARGE) OPTIME
TOPICAL | Status: DC | PRN
  Administered 2017-04-17: 1 via TOPICAL

## 2017-04-17 MED ORDER — ONDANSETRON HCL 4 MG/2ML IJ SOLN
INTRAMUSCULAR | Status: AC
  Filled 2017-04-17: qty 2

## 2017-04-17 MED ORDER — DEXAMETHASONE SODIUM PHOSPHATE 4 MG/ML IJ SOLN
INTRAMUSCULAR | Status: AC
  Filled 2017-04-17: qty 1

## 2017-04-17 MED ORDER — NEOSTIGMINE METHYLSULFATE 10 MG/10ML IV SOLN
INTRAVENOUS | Status: DC | PRN
  Administered 2017-04-17: 4 mg via INTRAVENOUS

## 2017-04-17 MED ORDER — POVIDONE-IODINE 10 % EX OINT
TOPICAL_OINTMENT | CUTANEOUS | Status: AC
  Filled 2017-04-17: qty 1

## 2017-04-17 MED ORDER — MIDAZOLAM HCL 2 MG/2ML IJ SOLN
INTRAMUSCULAR | Status: AC
  Filled 2017-04-17: qty 2

## 2017-04-17 MED ORDER — GLYCOPYRROLATE 0.2 MG/ML IJ SOLN
INTRAMUSCULAR | Status: DC | PRN
  Administered 2017-04-17: .7 mg via INTRAVENOUS

## 2017-04-17 MED ORDER — BUPIVACAINE HCL (PF) 0.5 % IJ SOLN
INTRAMUSCULAR | Status: AC
  Filled 2017-04-17: qty 30

## 2017-04-17 MED ORDER — ROCURONIUM 10MG/ML (10ML) SYRINGE FOR MEDFUSION PUMP - OPTIME
INTRAVENOUS | Status: DC | PRN
  Administered 2017-04-17: 32 mg via INTRAVENOUS
  Administered 2017-04-17: 8 mg via INTRAVENOUS

## 2017-04-17 MED ORDER — POVIDONE-IODINE 10 % OINT PACKET
TOPICAL_OINTMENT | CUTANEOUS | Status: DC | PRN
  Administered 2017-04-17: 1 via TOPICAL

## 2017-04-17 MED ORDER — KETOROLAC TROMETHAMINE 30 MG/ML IJ SOLN
30.0000 mg | Freq: Once | INTRAMUSCULAR | Status: AC
  Administered 2017-04-17: 30 mg via INTRAVENOUS

## 2017-04-17 MED ORDER — SCOPOLAMINE 1 MG/3DAYS TD PT72
MEDICATED_PATCH | TRANSDERMAL | Status: AC
  Filled 2017-04-17: qty 1

## 2017-04-17 MED ORDER — MIDAZOLAM HCL 2 MG/2ML IJ SOLN
1.0000 mg | INTRAMUSCULAR | Status: DC
  Administered 2017-04-17: 2 mg via INTRAVENOUS

## 2017-04-17 MED ORDER — HYDROMORPHONE HCL 1 MG/ML IJ SOLN
INTRAMUSCULAR | Status: AC
  Filled 2017-04-17: qty 1

## 2017-04-17 MED ORDER — HYDROMORPHONE HCL 1 MG/ML IJ SOLN
1.0000 mg | INTRAMUSCULAR | Status: DC | PRN
  Administered 2017-04-17 – 2017-04-18 (×6): 1 mg via INTRAVENOUS
  Filled 2017-04-17 (×7): qty 1

## 2017-04-17 MED ORDER — LACTATED RINGERS IV SOLN
INTRAVENOUS | Status: DC
  Administered 2017-04-17: 12:00:00 via INTRAVENOUS

## 2017-04-17 MED ORDER — PROPOFOL 10 MG/ML IV BOLUS
INTRAVENOUS | Status: AC
  Filled 2017-04-17: qty 20

## 2017-04-17 MED ORDER — METOCLOPRAMIDE HCL 5 MG/ML IJ SOLN
5.0000 mg | Freq: Three times a day (TID) | INTRAMUSCULAR | Status: DC
  Administered 2017-04-17 – 2017-04-18 (×3): 5 mg via INTRAVENOUS
  Filled 2017-04-17 (×3): qty 2

## 2017-04-17 MED ORDER — LORAZEPAM 2 MG/ML IJ SOLN
1.0000 mg | INTRAMUSCULAR | Status: DC | PRN
  Administered 2017-04-17 – 2017-04-18 (×4): 1 mg via INTRAVENOUS
  Filled 2017-04-17 (×4): qty 1

## 2017-04-17 MED ORDER — ENOXAPARIN SODIUM 40 MG/0.4ML ~~LOC~~ SOLN
40.0000 mg | SUBCUTANEOUS | Status: DC
  Administered 2017-04-18: 40 mg via SUBCUTANEOUS
  Filled 2017-04-17: qty 0.4

## 2017-04-17 MED ORDER — ROCURONIUM BROMIDE 50 MG/5ML IV SOLN
INTRAVENOUS | Status: AC
  Filled 2017-04-17: qty 1

## 2017-04-17 SURGICAL SUPPLY — 44 items
APPLIER CLIP LAPSCP 10X32 DD (CLIP) ×3 IMPLANT
BAG HAMPER (MISCELLANEOUS) ×3 IMPLANT
BAG RETRIEVAL 10 (BASKET) ×1
BAG RETRIEVAL 10MM (BASKET) ×1
CHLORAPREP W/TINT 26ML (MISCELLANEOUS) ×3 IMPLANT
CLOTH BEACON ORANGE TIMEOUT ST (SAFETY) ×3 IMPLANT
COVER LIGHT HANDLE STERIS (MISCELLANEOUS) ×6 IMPLANT
DECANTER SPIKE VIAL GLASS SM (MISCELLANEOUS) ×3 IMPLANT
ELECT REM PT RETURN 9FT ADLT (ELECTROSURGICAL) ×3
ELECTRODE REM PT RTRN 9FT ADLT (ELECTROSURGICAL) ×1 IMPLANT
FILTER SMOKE EVAC LAPAROSHD (FILTER) ×3 IMPLANT
FORMALIN 10 PREFIL 120ML (MISCELLANEOUS) ×3 IMPLANT
GLOVE BIOGEL PI IND STRL 7.0 (GLOVE) ×1 IMPLANT
GLOVE BIOGEL PI INDICATOR 7.0 (GLOVE) ×2
GLOVE SURG SS PI 7.5 STRL IVOR (GLOVE) ×3 IMPLANT
GOWN STRL REUS W/ TWL XL LVL3 (GOWN DISPOSABLE) ×1 IMPLANT
GOWN STRL REUS W/TWL LRG LVL3 (GOWN DISPOSABLE) ×6 IMPLANT
GOWN STRL REUS W/TWL XL LVL3 (GOWN DISPOSABLE) ×2
HEMOSTAT SNOW SURGICEL 2X4 (HEMOSTASIS) ×3 IMPLANT
INST SET LAPROSCOPIC AP (KITS) ×3 IMPLANT
IV NS IRRIG 3000ML ARTHROMATIC (IV SOLUTION) IMPLANT
KIT ROOM TURNOVER APOR (KITS) ×3 IMPLANT
MANIFOLD NEPTUNE II (INSTRUMENTS) ×3 IMPLANT
NEEDLE INSUFFLATION 14GA 120MM (NEEDLE) ×3 IMPLANT
NS IRRIG 1000ML POUR BTL (IV SOLUTION) ×3 IMPLANT
PACK LAP CHOLE LZT030E (CUSTOM PROCEDURE TRAY) ×3 IMPLANT
PAD ARMBOARD 7.5X6 YLW CONV (MISCELLANEOUS) ×3 IMPLANT
SET BASIN LINEN APH (SET/KITS/TRAYS/PACK) ×3 IMPLANT
SET TUBE IRRIG SUCTION NO TIP (IRRIGATION / IRRIGATOR) IMPLANT
SLEEVE ENDOPATH XCEL 5M (ENDOMECHANICALS) ×3 IMPLANT
SPONGE GAUZE 2X2 8PLY STER LF (GAUZE/BANDAGES/DRESSINGS) ×1
SPONGE GAUZE 2X2 8PLY STRL LF (GAUZE/BANDAGES/DRESSINGS) ×2 IMPLANT
STAPLER VISISTAT (STAPLE) ×3 IMPLANT
SUT VICRYL 0 UR6 27IN ABS (SUTURE) ×3 IMPLANT
SYS BAG RETRIEVAL 10MM (BASKET) ×1
SYSTEM BAG RETRIEVAL 10MM (BASKET) ×1 IMPLANT
TAPE PAPER 3X10 WHT MICROPORE (GAUZE/BANDAGES/DRESSINGS) ×3 IMPLANT
TROCAR ENDO BLADELESS 11MM (ENDOMECHANICALS) ×3 IMPLANT
TROCAR XCEL NON-BLD 5MMX100MML (ENDOMECHANICALS) ×3 IMPLANT
TROCAR XCEL UNIV SLVE 11M 100M (ENDOMECHANICALS) ×3 IMPLANT
TUBE CONNECTING 12'X1/4 (SUCTIONS) ×1
TUBE CONNECTING 12X1/4 (SUCTIONS) ×2 IMPLANT
TUBING INSUFFLATION (TUBING) ×3 IMPLANT
WARMER LAPAROSCOPE (MISCELLANEOUS) ×3 IMPLANT

## 2017-04-17 NOTE — Transfer of Care (Signed)
Immediate Anesthesia Transfer of Care Note  Patient: Debbie Santana  Procedure(s) Performed: Procedure(s): LAPAROSCOPIC CHOLECYSTECTOMY (N/A)  Patient Location: PACU  Anesthesia Type:General  Level of Consciousness: awake, alert  and oriented  Airway & Oxygen Therapy: Patient Spontanous Breathing  Post-op Assessment: Report given to RN  Post vital signs: Reviewed and stable  Last Vitals:  Vitals:   04/17/17 1210 04/17/17 1348  BP: 116/75 (!) 157/76  Pulse: (!) 59 82  Resp: (!) 31 14  Temp:  37.1 C    Last Pain:  Vitals:   04/17/17 1106  TempSrc: Oral  PainSc:       Patients Stated Pain Goal: 5 (00/37/04 8889)  Complications: No apparent anesthesia complications

## 2017-04-17 NOTE — H&P (View-Only) (Signed)
Reason for Consult: Chronic cholecystitis Referring Physician: Dr. Keenan Bachelor is an 26 y.o. female.  HPI: Patient is a 26 year old white female who presents with worsening upper abdominal pain, nausea, and vomiting. She does have a history of gastric ulcers. She has been having severe epigastric pain with nausea and vomiting intermittently for several months. She presented to the emergency room and was admitted tape and hospital for further evaluation treatment. Ultrasound gallbladder has been negative for cholelithiasis. CT scan of the abdomen and pelvis were unremarkable. She had a HIDA scan which revealed a low gallbladder ejection fraction with reproducible symptoms. A surgery consultation has been obtained for possible cholecystectomy. Patient currently has nausea. Her abdominal pain is 4 out of 10. She denies any fever, chills, jaundice.  Past Medical History:  Diagnosis Date  . Chronic back pain   . Gastric ulcer   . Sciatica     Past Surgical History:  Procedure Laterality Date  . BACK SURGERY    . HERNIA REPAIR      Family History  Problem Relation Age of Onset  . Anxiety disorder Mother     Social History:  reports that she has been smoking Cigarettes.  She has been smoking about 1.00 pack per day. She has never used smokeless tobacco. She reports that she uses drugs, including Marijuana. She reports that she does not drink alcohol.  Allergies: No Known Allergies  Medications:  Scheduled: . dicyclomine  10 mg Oral TID AC  .  HYDROmorphone (DILAUDID) injection  0.5 mg Intravenous Once  . metoCLOPramide (REGLAN) injection  10 mg Intravenous Q8H  . nicotine  14 mg Transdermal Daily  . pantoprazole  40 mg Oral BID AC    Results for orders placed or performed during the hospital encounter of 04/11/17 (from the past 48 hour(s))  Potassium     Status: None   Collection Time: 04/15/17 12:03 AM  Result Value Ref Range   Potassium 4.1 3.5 - 5.1 mmol/L    Comment:  DELTA CHECK NOTED  Basic metabolic panel     Status: None   Collection Time: 04/15/17  6:09 AM  Result Value Ref Range   Sodium 138 135 - 145 mmol/L   Potassium 3.6 3.5 - 5.1 mmol/L   Chloride 103 101 - 111 mmol/L   CO2 29 22 - 32 mmol/L   Glucose, Bld 85 65 - 99 mg/dL   BUN 7 6 - 20 mg/dL   Creatinine, Ser 0.92 0.44 - 1.00 mg/dL   Calcium 9.4 8.9 - 10.3 mg/dL   GFR calc non Af Amer >60 >60 mL/min   GFR calc Af Amer >60 >60 mL/min    Comment: (NOTE) The eGFR has been calculated using the CKD EPI equation. This calculation has not been validated in all clinical situations. eGFR's persistently <60 mL/min signify possible Chronic Kidney Disease.    Anion gap 6 5 - 15  Basic metabolic panel     Status: Abnormal   Collection Time: 04/16/17  5:46 AM  Result Value Ref Range   Sodium 138 135 - 145 mmol/L   Potassium 3.7 3.5 - 5.1 mmol/L   Chloride 100 (L) 101 - 111 mmol/L   CO2 29 22 - 32 mmol/L   Glucose, Bld 87 65 - 99 mg/dL   BUN 10 6 - 20 mg/dL   Creatinine, Ser 0.89 0.44 - 1.00 mg/dL   Calcium 9.6 8.9 - 10.3 mg/dL   GFR calc non Af Amer >60 >60  mL/min   GFR calc Af Amer >60 >60 mL/min    Comment: (NOTE) The eGFR has been calculated using the CKD EPI equation. This calculation has not been validated in all clinical situations. eGFR's persistently <60 mL/min signify possible Chronic Kidney Disease.    Anion gap 9 5 - 15    Nm Hepato W/eject Fract  Result Date: 04/15/2017 CLINICAL DATA:  RIGHT upper quadrant abdominal pain for 4 months, nausea, vomiting, worsened symptoms with food EXAM: NUCLEAR MEDICINE HEPATOBILIARY IMAGING WITH GALLBLADDER EF TECHNIQUE: Sequential images of the abdomen were obtained out to 60 minutes following intravenous administration of radiopharmaceutical. After oral ingestion of Ensure, gallbladder ejection fraction was determined. At 60 min, normal ejection fraction is greater than 33%. RADIOPHARMACEUTICALS:  5.5 mCi Tc-38mCholetec IV COMPARISON:   None FINDINGS: Normal tracer extraction from bloodstream indicating normal hepatocellular function. Normal excretion of tracer into biliary tree. Gallbladder visualized at 18 min. Small bowel visualized at 23 min. No hepatic retention of tracer. Suggests a week decrease emptying of tracer from gallbladder following fatty meal stimulation. Calculated gallbladder ejection fraction is 15%, abnormally low. Patient reported abdominal pain following Ensure ingestion. It should be noted that the patient received Dilaudid approximately 6 hours prior to imaging. Normal gallbladder ejection fraction following Ensure ingestion is greater than 33% at 1 hour. IMPRESSION: Patent biliary tree. Abnormal gallbladder response to fatty meal stimulation with a decreased gallbladder ejection fraction of 15% as discussed above. Electronically Signed   By: MLavonia DanaM.D.   On: 04/15/2017 15:13    ROS:  Pertinent items are noted in HPI.  Blood pressure (!) 97/54, pulse 80, temperature 98.6 F (37 C), temperature source Oral, resp. rate 18, height 5' 7"  (1.702 m), weight 195 lb (88.5 kg), last menstrual period 04/05/2017, SpO2 99 %. Physical Exam: Pleasant well-developed well-nourished white female in no acute distress Head is normocephalic, atraumatic Eyes are without scleral icterus Lungs clear auscultation with equal breath sounds bilaterally Heart examination reveals a regular rate and rhythm without S3, S4, murmurs Abdomen is soft with some discomfort to palpation in the right upper quadrant. No hepatosplenomegaly, masses, hernias identified. No rigidity is noted.  Dr. ROlevia Perchesnotes and results reviewed  Assessment/Plan: Chronic cholecystitis Plan: Patient will undergo laparoscopic cholecystectomy on 04/17/2017. The risks and benefits of the procedure including bleeding, infection, hepatobiliary injury, and the possibility of an open procedure were fully explained to the patient, who gave informed consent.  MAviva Signs7/25/2018, 10:07 AM

## 2017-04-17 NOTE — Progress Notes (Signed)
PROGRESS NOTE    Debbie Santana  PTW:[id number] DOB: [birth year] DOA: 04/11/2017 PCP: Patient, No Pcp Per    Brief Narrative:  Patient is a 26 y.o. female with PMH significant of chronic back pain and gastric ulcers. Patient reported a 2 day history of intermittent abdominal pain associated with nausea, vomiting and diarrhea. Patient was unable to tolerate any oral intake. Diarrhea was just loose stool. Emesis was nonbloody. Of note patient was seen one day prior to admission in the ED and had extensive workup that was unremarkable including normal ultrasound, normal CT, normal UA and essentially normal chemistries. Patient stated that her pain was in the epigastric region with radiation to her back. She denied fever but had chills and tremor. She denied any sick contacts or eating any spoiled foods. Denied any recent travel.  In the ED, she was afebrile and hemodynamically stable, but with mild bradycardia. Her labs were significant for a white blood cell count was 17.5, potassium of 3.4, glucose 156, normal LFTs and normal lipase. She was admitted for further evaluation and management.   Assessment & Plan:   Principal Problem:   Chronic cholecystitis Active Problems:   Erosive gastritis   Intractable abdominal pain   Reflux esophagitis   Peptic ulcer disease   Leukocytosis   Hyperglycemia   Anxiety   Obesity   Bradycardia    1. Acute gastritis/gastroenteritis with hronic cholecystitis. Patient presented with nausea/vomiting/diarrhea and the initial working diagnosis was an acute gastroenteritis. She was started on supportive treatment, IV fluids, analgesics, and antiemetics. -Gastroenterology was consulted. Dr. Laural Golden performed EGD which revealed erosive gastritis and reflux esophagitis. Biopsy taken-results revealed chronic active gastritis. No H pylori seen. -Fecal lactoferrin ordered, but not collected-diarrhea has resolved. -Patient was started on Bentyl and Protonix; Reglan IV  scheduled. -Diet was advanced, but abdominal pain and N/V continued. -HIDA scan ordered and revealed a decreased ejection fraction. General surgeon, Dr. Arnoldo Morale was consulted. He diagnosed the patient with chronic cholecystitis. Laparoscopic cholecystectomy is pending for 7/26. -Leukocytosis has resolved.  Hyperglycemia. Patient's blood glucose was in the 150s on admission. -Her glucose has normalized. The elevation may have been from the infection. Hemoglobin A1c is pending.  Bradycardia. Patient has been intermittently bradycardic and was bradycardic on admission. -We'll order TSH for further evaluation.  Tobacco abuse. Patient was advised to stop smoking. We'll continue nicotine patch.   DVT prophylaxis: Lovenox on hold for surgery  Code Status: Full code Family Communication: Family not available Disposition Plan: Discharge in the next couple of days ago when clinically stable.   Consultants:   Gen. surgery  Gastroenterology  Procedures: (Don't include imaging studies which can be auto populated. Include things that cannot be auto populated i.e. Echo, Carotid and venous dopplers, Foley, Bipap, HD, tubes/drains, wound vac, central lines etc)  Laparoscopic cholecystectomy, pending  EGD, 04/14/17-Rehman: Normal esophagus, mild reflux esophagitis limited to the GE junction, erosive gastropathy-status post biopsy;  Antimicrobials:  None    Subjective: Patient denies nausea, vomiting, and diarrhea. Her last bowel movement was 2 days ago. She reports mild right upper quadrant abdominal pain.  Objective: Vitals:   04/16/17 0500 04/16/17 1937 04/16/17 2215 04/17/17 0530  BP: (!) 97/54  126/72 (!) 113/56  Pulse: 80  65 72  Resp: 18  20 15   Temp: 98.6 F (37 C)  98.7 F (37.1 C) 97.8 F (36.6 C)  TempSrc: Oral  Oral Oral  SpO2: 99% 95% 100% 95%  Weight:      Height:  Intake/Output Summary (Last 24 hours) at 04/17/17 0828 Last data filed at 04/16/17 1700   Gross per 24 hour  Intake              840 ml  Output                0 ml  Net              840 ml   Filed Weights   04/11/17 1136  Weight: 88.5 kg (195 lb)    Examination:  General exam: Appears calm and comfortable  Respiratory system: Clear to auscultation. Respiratory effort normal. Cardiovascular system: S1 & S2 heard, RRR with borderline bradycardia. No pedal edema. Gastrointestinal system: Abdomen is nondistended, soft and minimal right upper quadrant tenderness. No organomegaly or masses felt. Normal bowel sounds heard. Central nervous system: Alert and oriented. No focal neurological deficits. Extremities: Symmetric 5 x 5 power. Skin: No rashes, lesions or ulcers Psychiatry: Judgement and insight appear normal. Mood & affect appropriate.     Data Reviewed: I have personally reviewed following labs and imaging studies  CBC:  Recent Labs Lab 04/11/17 1310 04/12/17 0615 04/13/17 0542 04/14/17 0936  WBC 17.5* 15.5* 13.2* 9.5  NEUTROABS 14.7*  --  8.7* 5.3  HGB 14.4 13.2 12.7 14.6  HCT 41.7 39.2 37.9 42.8  MCV 91.2 93.1 93.1 92.6  PLT 260 221 194 366   Basic Metabolic Panel:  Recent Labs Lab 04/11/17 1946 04/12/17 0615 04/13/17 0542 04/14/17 0936 04/15/17 0003 04/15/17 0609 04/16/17 0546  NA  --  141 140 138  --  138 138  K  --  3.5 3.0* 2.9* 4.1 3.6 3.7  CL  --  107 107 101  --  103 100*  CO2  --  26 26 28   --  29 29  GLUCOSE  --  91 83 92  --  85 87  BUN  --  5* <5* <5*  --  7 10  CREATININE  --  0.72 0.69 0.86  --  0.92 0.89  CALCIUM  --  9.0 8.7* 9.4  --  9.4 9.6  MG 1.5*  --   --  1.9  --   --   --   PHOS 2.9  --   --   --   --   --   --    GFR: Estimated Creatinine Clearance: 109.5 mL/min (by C-G formula based on SCr of 0.89 mg/dL). Liver Function Tests:  Recent Labs Lab 04/11/17 1310  AST 20  ALT 14  ALKPHOS 60  BILITOT 1.1  PROT 6.8  ALBUMIN 4.0    Recent Labs Lab 04/11/17 1310  LIPASE 19   No results for input(s): AMMONIA  in the last 168 hours. Coagulation Profile: No results for input(s): INR, PROTIME in the last 168 hours. Cardiac Enzymes: No results for input(s): CKTOTAL, CKMB, CKMBINDEX, TROPONINI in the last 168 hours. BNP (last 3 results) No results for input(s): PROBNP in the last 8760 hours. HbA1C: No results for input(s): HGBA1C in the last 72 hours. CBG: No results for input(s): GLUCAP in the last 168 hours. Lipid Profile: No results for input(s): CHOL, HDL, LDLCALC, TRIG, CHOLHDL, LDLDIRECT in the last 72 hours. Thyroid Function Tests: No results for input(s): TSH, T4TOTAL, FREET4, T3FREE, THYROIDAB in the last 72 hours. Anemia Panel: No results for input(s): VITAMINB12, FOLATE, FERRITIN, TIBC, IRON, RETICCTPCT in the last 72 hours. Sepsis Labs:  Recent Labs Lab 04/11/17 1942  LATICACIDVEN 1.4  No results found for this or any previous visit (from the past 240 hour(s)).       Radiology Studies: Nm Hepato W/eject Fract  Result Date: 04/15/2017 CLINICAL DATA:  RIGHT upper quadrant abdominal pain for 4 months, nausea, vomiting, worsened symptoms with food EXAM: NUCLEAR MEDICINE HEPATOBILIARY IMAGING WITH GALLBLADDER EF TECHNIQUE: Sequential images of the abdomen were obtained out to 60 minutes following intravenous administration of radiopharmaceutical. After oral ingestion of Ensure, gallbladder ejection fraction was determined. At 60 min, normal ejection fraction is greater than 33%. RADIOPHARMACEUTICALS:  5.5 mCi Tc-66m Choletec IV COMPARISON:  None FINDINGS: Normal tracer extraction from bloodstream indicating normal hepatocellular function. Normal excretion of tracer into biliary tree. Gallbladder visualized at 18 min. Small bowel visualized at 23 min. No hepatic retention of tracer. Suggests a week decrease emptying of tracer from gallbladder following fatty meal stimulation. Calculated gallbladder ejection fraction is 15%, abnormally low. Patient reported abdominal pain following  Ensure ingestion. It should be noted that the patient received Dilaudid approximately 6 hours prior to imaging. Normal gallbladder ejection fraction following Ensure ingestion is greater than 33% at 1 hour. IMPRESSION: Patent biliary tree. Abnormal gallbladder response to fatty meal stimulation with a decreased gallbladder ejection fraction of 15% as discussed above. Electronically Signed   By: Lavonia Dana M.D.   On: 04/15/2017 15:13        Scheduled Meds: . dicyclomine  10 mg Oral TID AC  .  HYDROmorphone (DILAUDID) injection  0.5 mg Intravenous Once  . metoCLOPramide (REGLAN) injection  10 mg Intravenous Q8H  . nicotine  14 mg Transdermal Daily  . pantoprazole  40 mg Oral BID AC   Continuous Infusions: . ciprofloxacin Stopped (04/17/17 0555)     LOS: 4 days    Time spent: 25 minutes    Rexene Alberts, MD [facility] Pager [phone] If 7PM-7AM, please contact night-coverage [email or web address] Password [facility] 04/17/2017, 8:28 AM

## 2017-04-17 NOTE — Anesthesia Postprocedure Evaluation (Signed)
Anesthesia Post Note  Patient: Debbie Santana  Procedure(s) Performed: Procedure(s) (LRB): LAPAROSCOPIC CHOLECYSTECTOMY (N/A)  Patient location during evaluation: PACU Anesthesia Type: General Level of consciousness: awake and alert and oriented Pain management: pain level controlled Vital Signs Assessment: post-procedure vital signs reviewed and stable Respiratory status: spontaneous breathing Cardiovascular status: blood pressure returned to baseline Postop Assessment: no signs of nausea or vomiting Anesthetic complications: no Comments: Late entry.     Last Vitals:  Vitals:   04/17/17 1430 04/17/17 1445  BP: (!) 135/97 121/77  Pulse: 62 66  Resp: (!) 21 10  Temp:      Last Pain:  Vitals:   04/17/17 1445  TempSrc:   PainSc: 3                  Kaulin Chaves

## 2017-04-17 NOTE — Interval H&P Note (Signed)
History and Physical Interval Note:  04/17/2017 12:36 PM  Debbie Santana  has presented today for surgery, with the diagnosis of chronic cholecystitis  The various methods of treatment have been discussed with the patient and family. After consideration of risks, benefits and other options for treatment, the patient has consented to  Procedure(s): LAPAROSCOPIC CHOLECYSTECTOMY (N/A) as a surgical intervention .  The patient's history has been reviewed, patient examined, no change in status, stable for surgery.  I have reviewed the patient's chart and labs.  Questions were answered to the patient's satisfaction.     Aviva Signs

## 2017-04-17 NOTE — Care Management Note (Signed)
Case Management Note  Patient Details  Name: CRISTIN PENAFLOR MRN: [id number] Date of Birth: [birth year]  If discussed at Long Length of Stay Meetings, dates discussed:  04/17/2017  Additional Comments:  Tymara Saur, Chauncey Reading, RN 04/17/2017, 10:26 AM

## 2017-04-17 NOTE — Anesthesia Preprocedure Evaluation (Addendum)
Anesthesia Evaluation  Patient identified by MRN, date of birth, ID band Patient awake    Reviewed: Allergy & Precautions, NPO status , Patient's Chart, lab work & pertinent test results  Airway Mallampati: II  TM Distance: >3 FB Neck ROM: Full    Dental  (+) Teeth Intact, Partial Upper   Pulmonary Current Smoker,    breath sounds clear to auscultation       Cardiovascular negative cardio ROS   Rhythm:Regular Rate:Normal     Neuro/Psych PSYCHIATRIC DISORDERS Anxiety  Neuromuscular disease    GI/Hepatic Neg liver ROS, PUD,   Endo/Other  negative endocrine ROS  Renal/GU negative Renal ROS     Musculoskeletal   Abdominal   Peds  Hematology   Anesthesia Other Findings   Reproductive/Obstetrics                             Anesthesia Physical Anesthesia Plan  ASA: II  Anesthesia Plan: General   Post-op Pain Management:    Induction: Intravenous, Rapid sequence and Cricoid pressure planned  PONV Risk Score and Plan:   Airway Management Planned: Oral ETT  Additional Equipment:   Intra-op Plan:   Post-operative Plan: Extubation in OR  Informed Consent: I have reviewed the patients History and Physical, chart, labs and discussed the procedure including the risks, benefits and alternatives for the proposed anesthesia with the patient or authorized representative who has indicated his/her understanding and acceptance.     Plan Discussed with:   Anesthesia Plan Comments: (Nauseated today)       Anesthesia Quick Evaluation

## 2017-04-17 NOTE — Op Note (Signed)
Patient:  QUINTA EIMER  DOB:  1991-06-20  MRN:  [id number]   Preop Diagnosis:  Chronic cholecystitis  Postop Diagnosis:  Same  Procedure:  Laparoscopic cholecystectomy  Surgeon:  Aviva Signs, M.D.  Anes:  Gen. endotracheal  Indications:  Patient is a 26 year old white female who was admitted to the hospital with nausea and vomiting. Workup revealed chronic cholecystitis. The risks and benefits of the procedure including bleeding, infection, hepatobiliary injury, and the possibility of an open procedure were fully explained to the patient, who gave informed consent.  Procedure note:  The patient was placed in supine position. After induction of general endotracheal anesthesia, the abdomen was prepped and draped using the usual sterile technique with DuraPrep. Surgical site confirmation was performed.  A supraumbilical incision was made down to the fascia. A Veress needle was introduced into the abdominal cavity and confirmation of placement was done using the saline drop test. The abdomen was then insufflated to 16 mmHg pressure. An 11 mm trocar was introduced into the abdominal cavity under direct visualization without difficulty. The patient was placed in reversed or number position and an additional 11 mm trocar was placed the epigastric region and 5 mm trochars were placed the right upper quadrant and right flank regions. Liver was inspected and noted to be within normal limits. The gallbladder was retracted in a dynamic fashion in order to provide a critical view of the triangle of Calot. The cystic duct was first identified. Its juncture to the infundibulum was fully identified. Endoclips were placed proximally and distally on the cystic duct, and the cystic duct was divided. This was likewise done to the cystic artery. The gallbladder was freed away from the gallbladder fossa using Bovie electrocautery. The gallbladder was delivered through the epigastric trocar site using an Endo Catch  bag. The gallbladder fossa was inspected and no abnormal bleeding or bile leakage was noted. Surgicel was placed the gallbladder fossa. All fluid and air were then evacuated from the abdominal cavity prior to the removal of the trochars.  All wounds were irrigated with normal saline. All wounds were injected with 0.5% Sensorcaine. The epigastric fascia as well as suprapubic fascia were reapproximated using 0 Vicryl interrupted sutures. All skin incisions were closed using staples. Betadine ointment and dry sterile dressings were applied.  All tape and needle counts were correct at the end of the procedure. The patient was extubated in the operating room and transferred to PACU in stable condition.  Complications:  None  EBL:  Minimal  Specimen:  Gallbladder

## 2017-04-17 NOTE — Addendum Note (Signed)
Addendum  created 04/17/17 1732 by Ollen Bowl, CRNA   Anesthesia Intra Meds edited

## 2017-04-17 NOTE — Anesthesia Procedure Notes (Addendum)
Procedure Name: Intubation Date/Time: 04/17/2017 12:56 PM Performed by: Tressie Stalker E Pre-anesthesia Checklist: Patient identified, Patient being monitored, Timeout performed, Emergency Drugs available and Suction available Patient Re-evaluated:Patient Re-evaluated prior to induction Oxygen Delivery Method: Circle system utilized Preoxygenation: Pre-oxygenation with 100% oxygen Induction Type: IV induction, Rapid sequence and Cricoid Pressure applied Ventilation: Mask ventilation without difficulty Laryngoscope Size: Mac and 3 Grade View: Grade I Tube type: Oral Tube size: 7.0 mm Number of attempts: 1 Airway Equipment and Method: Stylet Placement Confirmation: ETT inserted through vocal cords under direct vision,  positive ETCO2 and breath sounds checked- equal and bilateral Secured at: 21 cm Tube secured with: Tape Dental Injury: Teeth and Oropharynx as per pre-operative assessment

## 2017-04-18 ENCOUNTER — Encounter (HOSPITAL_COMMUNITY): Payer: Self-pay | Admitting: General Surgery

## 2017-04-18 LAB — CBC
HEMATOCRIT: 41.4 % (ref 36.0–46.0)
HEMOGLOBIN: 14.1 g/dL (ref 12.0–15.0)
MCH: 31.6 pg (ref 26.0–34.0)
MCHC: 34.1 g/dL (ref 30.0–36.0)
MCV: 92.8 fL (ref 78.0–100.0)
Platelets: 223 10*3/uL (ref 150–400)
RBC: 4.46 MIL/uL (ref 3.87–5.11)
RDW: 13.1 % (ref 11.5–15.5)
WBC: 15.9 10*3/uL — AB (ref 4.0–10.5)

## 2017-04-18 LAB — COMPREHENSIVE METABOLIC PANEL
ALBUMIN: 3.6 g/dL (ref 3.5–5.0)
ALK PHOS: 55 U/L (ref 38–126)
ALT: 42 U/L (ref 14–54)
AST: 32 U/L (ref 15–41)
Anion gap: 7 (ref 5–15)
BILIRUBIN TOTAL: 0.7 mg/dL (ref 0.3–1.2)
BUN: 8 mg/dL (ref 6–20)
CO2: 28 mmol/L (ref 22–32)
CREATININE: 0.78 mg/dL (ref 0.44–1.00)
Calcium: 9.5 mg/dL (ref 8.9–10.3)
Chloride: 101 mmol/L (ref 101–111)
GFR calc Af Amer: >60 mL/min (ref >60)
GLUCOSE: 103 mg/dL — AB (ref 65–99)
POTASSIUM: 3.8 mmol/L (ref 3.5–5.1)
Sodium: 136 mmol/L (ref 135–145)
Total Protein: 6.4 g/dL — ABNORMAL LOW (ref 6.5–8.1)

## 2017-04-18 LAB — HIV ANTIBODY (ROUTINE TESTING W REFLEX): HIV SCREEN 4TH GENERATION: NONREACTIVE

## 2017-04-18 LAB — T4, FREE: FREE T4: 1.36 ng/dL — AB (ref 0.61–1.12)

## 2017-04-18 LAB — TSH: TSH: 0.663 u[IU]/mL (ref 0.350–4.500)

## 2017-04-18 MED ORDER — ONDANSETRON HCL 4 MG PO TABS: 4.0000 mg | ORAL_TABLET | Freq: Three times a day (TID) | ORAL | 1 refills | Status: DC | PRN

## 2017-04-18 MED ORDER — OMEPRAZOLE 20 MG PO CPDR: 20.0000 mg | DELAYED_RELEASE_CAPSULE | Freq: Two times a day (BID) | ORAL | 1 refills | Status: DC

## 2017-04-18 MED ORDER — OXYCODONE-ACETAMINOPHEN 7.5-325 MG PO TABS: 1.0000 | ORAL_TABLET | ORAL | 0 refills | Status: DC | PRN

## 2017-04-18 NOTE — Addendum Note (Signed)
Addendum  created 04/18/17 0729 by Vista Deck, CRNA   Anesthesia Staff edited

## 2017-04-18 NOTE — Addendum Note (Signed)
Addendum  created 04/18/17 0901 by Vista Deck, CRNA   Sign clinical note

## 2017-04-18 NOTE — Discharge Summary (Signed)
Physician Discharge Summary  Patient ID: Debbie Santana MRN: [id number] DOB/AGE: 25-Sep-1990 26 y.o.  Admit date: 04/11/2017 Discharge date: 04/18/2017  Admission Diagnoses:Right upper quadrant abdominal pain  Discharge Diagnoses: Same, chronic cholecystitis Principal Problem:   Chronic cholecystitis Active Problems:   Erosive gastritis   Intractable abdominal pain   Leukocytosis   Hyperglycemia   Anxiety   Peptic ulcer disease   Reflux esophagitis   Obesity   Bradycardia   Tobacco abuse   Discharged Condition: good  Hospital Course: Patient is a 26 year old white female who was admitted to the hospital for workup of a 27 hour history of worsening right upper quadrant abdominal pain, nausea, vomiting. She has a history of chronic back pain as well as gastric ulcers. Patient underwent an upper endoscopy by Dr. Laural Golden on 04/14/2017 which was for the most part unremarkable. She subsequently underwent an ultrasound which was negative for cholelithiasis. Hepatobiliary scan revealed a low gallbladder ejection fraction with reproducible symptoms. Surgery was consulted and the patient subsequently underwent a laparoscopic cholecystectomy on 04/17/2017. She tolerated the procedure well. Her postoperative course has been unremarkable. Her diet was advanced without difficulty. The patient is being discharged home on 04/18/2017 in good and improving condition.  Consults: GI and general surgery   Treatments: surgery: EGD on 04/14/2017 Laparoscopic cholecystectomy on 04/17/2017  Discharge Exam: Blood pressure 111/72, pulse 96, temperature (!) 97.5 F (36.4 C), temperature source Oral, resp. rate 20, height 5\' 7"  (1.702 m), weight 195 lb (88.5 kg), last menstrual period 04/05/2017, SpO2 100 %. General appearance: alert, cooperative and no distress Resp: clear to auscultation bilaterally Cardio: regular rate and rhythm, S1, S2 normal, no murmur, click, rub or gallop GI: Soft, dressings dry and  intact.  Disposition: 01-Home or Self Care  Discharge Instructions    Diet general    Complete by:  As directed    Increase activity slowly    Complete by:  As directed      Allergies as of 04/18/2017   No Known Allergies     Medication List    TAKE these medications   dimenhyDRINATE 50 MG tablet Commonly known as:  DRAMAMINE Take 50 mg by mouth every morning.   omeprazole 20 MG capsule Commonly known as:  PRILOSEC Take 1 capsule (20 mg total) by mouth 2 (two) times daily before a meal.   ondansetron 4 MG tablet Commonly known as:  ZOFRAN Take 1 tablet (4 mg total) by mouth every 8 (eight) hours as needed for nausea or vomiting.   oxyCODONE-acetaminophen 7.5-325 MG tablet Commonly known as:  PERCOCET Take 1-2 tablets by mouth every 4 (four) hours as needed.   promethazine 25 MG tablet Commonly known as:  PHENERGAN Take 1 tablet (25 mg total) by mouth every 6 (six) hours as needed for nausea.   ranitidine 150 MG tablet Commonly known as:  ZANTAC Take 300 mg by mouth daily.      Follow-up Information    Services, [facility]. Schedule an appointment as soon as possible for a visit in 1 week.   Why:  [facility] has a walk in clinic Monday through Friday 8:00 a.m. - 3:30 p.m. Please arrive by 2:30 to ensure that you are seen.  Bring photo identification and social security card.  [facility] accepts clients who are uninsured.   Contact information: [address] [place] [address] [phone]        Aviva Signs, MD. Schedule an appointment as soon as possible for a visit on  04/24/2017.   Specialty:  General Surgery Why:  10:30am Contact information: [id number] [place] [address] [phone]           Signed: Aviva Signs 04/18/2017, 10:50 AM

## 2017-04-18 NOTE — Discharge Instructions (Signed)
Laparoscopic Cholecystectomy, Care After °This sheet gives you information about how to care for yourself after your procedure. Your health care provider may also give you more specific instructions. If you have problems or questions, contact your health care provider. °What can I expect after the procedure? °After the procedure, it is common to have: °· Pain at your incision sites. You will be given medicines to control this pain. °· Mild nausea or vomiting. °· Bloating and possible shoulder pain from the air-like gas that was used during the procedure. °Follow these instructions at home: °Incision care  ° °· Follow instructions from your health care provider about how to take care of your incisions. Make sure you: °¨ Wash your hands with soap and water before you change your bandage (dressing). If soap and water are not available, use hand sanitizer. °¨ Change your dressing as told by your health care provider. °¨ Leave stitches (sutures), skin glue, or adhesive strips in place. These skin closures may need to be in place for 2 weeks or longer. If adhesive strip edges start to loosen and curl up, you may trim the loose edges. Do not remove adhesive strips completely unless your health care provider tells you to do that. °· Do not take baths, swim, or use a hot tub until your health care provider approves. Ask your health care provider if you can take showers. You may only be allowed to take sponge baths for bathing. °· Check your incision area every day for signs of infection. Check for: °¨ More redness, swelling, or pain. °¨ More fluid or blood. °¨ Warmth. °¨ Pus or a bad smell. °Activity  °· Do not drive or use heavy machinery while taking prescription pain medicine. °· Do not lift anything that is heavier than 10 lb (4.5 kg) until your health care provider approves. °· Do not play contact sports until your health care provider approves. °· Do not drive for 24 hours if you were given a medicine to help you relax  (sedative). °· Rest as needed. Do not return to work or school until your health care provider approves. °General instructions  °· Take over-the-counter and prescription medicines only as told by your health care provider. °· To prevent or treat constipation while you are taking prescription pain medicine, your health care provider may recommend that you: °¨ Drink enough fluid to keep your urine clear or pale yellow. °¨ Take over-the-counter or prescription medicines. °¨ Eat foods that are high in fiber, such as fresh fruits and vegetables, whole grains, and beans. °¨ Limit foods that are high in fat and processed sugars, such as fried and sweet foods. °Contact a health care provider if: °· You develop a rash. °· You have more redness, swelling, or pain around your incisions. °· You have more fluid or blood coming from your incisions. °· Your incisions feel warm to the touch. °· You have pus or a bad smell coming from your incisions. °· You have a fever. °· One or more of your incisions breaks open. °Get help right away if: °· You have trouble breathing. °· You have chest pain. °· You have increasing pain in your shoulders. °· You faint or feel dizzy when you stand. °· You have severe pain in your abdomen. °· You have nausea or vomiting that lasts for more than one day. °· You have leg pain. °This information is not intended to replace advice given to you by your health care provider. Make sure you discuss any   questions you have with your health care provider. °Document Released: 09/09/2005 Document Revised: 03/30/2016 Document Reviewed: 02/26/2016 °[employer]vier Interactive Patient Education © 2017 [employer] ° °

## 2017-04-18 NOTE — Anesthesia Postprocedure Evaluation (Signed)
Anesthesia Post Note  Patient: Debbie Santana  Procedure(s) Performed: Procedure(s) (LRB): LAPAROSCOPIC CHOLECYSTECTOMY (N/A)  Patient location during evaluation: Nursing Unit Anesthesia Type: General Level of consciousness: awake and alert Pain management: satisfactory to patient Vital Signs Assessment: post-procedure vital signs reviewed and stable Respiratory status: spontaneous breathing Cardiovascular status: stable Postop Assessment: adequate PO intake Anesthetic complications: no     Last Vitals:  Vitals:   04/18/17 0234 04/18/17 0611  BP: 108/64 111/72  Pulse: 86 96  Resp: 18 20  Temp: 36.9 C (!) 36.4 C    Last Pain:  Vitals:   04/18/17 0611  TempSrc: Oral  PainSc:                  Drucie Opitz

## 2017-04-23 LAB — HEMOGLOBIN A1C
HEMOGLOBIN A1C: 5.1 % (ref 4.8–5.6)
MEAN PLASMA GLUCOSE: 100 mg/dL

## 2017-04-24 ENCOUNTER — Encounter: Payer: Self-pay | Admitting: General Surgery

## 2017-04-24 ENCOUNTER — Ambulatory Visit (INDEPENDENT_AMBULATORY_CARE_PROVIDER_SITE_OTHER): Payer: Self-pay | Admitting: General Surgery

## 2017-04-24 VITALS — BP 143/80 | HR 97 | Temp 97.7°F | Resp 18 | Ht 68.0 in | Wt 189.0 lb

## 2017-04-24 DIAGNOSIS — Z09 Encounter for follow-up examination after completed treatment for conditions other than malignant neoplasm: Secondary | ICD-10-CM

## 2017-04-24 NOTE — Progress Notes (Signed)
Subjective:     Debbie Santana  Status post laparoscopic cholecystectomy. Preoperative symptoms have resolved. Mild incisional pain is resolving. Objective:    BP (!) 143/80   Pulse 97   Temp 97.7 F (36.5 C)   Resp 18   Ht 5\' 8"  (1.727 m)   Wt 189 lb (85.7 kg)   LMP 04/05/2017 (Within Days)   BMI 28.74 kg/m   General:  alert, cooperative and no distress  Abdomen soft, incisions healing well. Staples removed, sterile strips applied. Final pathology consistent with diagnosis.     Assessment:    Doing well postoperatively.    Plan:   Increase activity as able. May return to work without restrictions on 05/05/2017.

## 2017-04-26 ENCOUNTER — Observation Stay (HOSPITAL_COMMUNITY)
Admit: 2017-04-26 | Discharge: 2017-04-26 | Disposition: A | Discharge status: Home / Self Care | Payer: Self-pay | Attending: Emergency Medicine | Admitting: Emergency Medicine

## 2017-04-26 ENCOUNTER — Encounter (HOSPITAL_COMMUNITY): Payer: Self-pay

## 2017-04-26 ENCOUNTER — Emergency Department (HOSPITAL_COMMUNITY): Payer: Self-pay

## 2017-04-26 ENCOUNTER — Observation Stay (HOSPITAL_COMMUNITY)
Admission: EM | Admit: 2017-04-26 | Discharge: 2017-04-27 | Disposition: A | Discharge status: Home / Self Care | Payer: Self-pay | Attending: Family Medicine | Admitting: Family Medicine

## 2017-04-26 DIAGNOSIS — R1011 Right upper quadrant pain: Principal | ICD-10-CM | POA: Insufficient documentation

## 2017-04-26 DIAGNOSIS — R1115 Cyclical vomiting syndrome unrelated to migraine: Secondary | ICD-10-CM

## 2017-04-26 DIAGNOSIS — R112 Nausea with vomiting, unspecified: Secondary | ICD-10-CM

## 2017-04-26 DIAGNOSIS — D72829 Elevated white blood cell count, unspecified: Secondary | ICD-10-CM

## 2017-04-26 DIAGNOSIS — R109 Unspecified abdominal pain: Secondary | ICD-10-CM

## 2017-04-26 DIAGNOSIS — F17210 Nicotine dependence, cigarettes, uncomplicated: Secondary | ICD-10-CM

## 2017-04-26 LAB — COMPREHENSIVE METABOLIC PANEL
ALK PHOS: 75 U/L (ref 38–126)
ALT: 21 U/L (ref 14–54)
AST: 22 U/L (ref 15–41)
Albumin: 4.4 g/dL (ref 3.5–5.0)
Anion gap: 14 (ref 5–15)
BUN: 9 mg/dL (ref 6–20)
CALCIUM: 10.9 mg/dL — AB (ref 8.9–10.3)
CHLORIDE: 107 mmol/L (ref 101–111)
CO2: 22 mmol/L (ref 22–32)
CREATININE: 0.87 mg/dL (ref 0.44–1.00)
Glucose, Bld: 132 mg/dL — ABNORMAL HIGH (ref 65–99)
Potassium: 3.9 mmol/L (ref 3.5–5.1)
Sodium: 143 mmol/L (ref 135–145)
Total Bilirubin: 0.8 mg/dL (ref 0.3–1.2)
Total Protein: 8.1 g/dL (ref 6.5–8.1)

## 2017-04-26 LAB — CBC WITH DIFFERENTIAL/PLATELET
BASOS PCT: 0 %
Basophils Absolute: 0 10*3/uL (ref 0.0–0.1)
EOS ABS: 0 10*3/uL (ref 0.0–0.7)
Eosinophils Relative: 0 %
HEMATOCRIT: 44.1 % (ref 36.0–46.0)
Hemoglobin: 15.2 g/dL — ABNORMAL HIGH (ref 12.0–15.0)
Lymphocytes Relative: 17 %
Lymphs Abs: 3.2 10*3/uL (ref 0.7–4.0)
MCH: 31.4 pg (ref 26.0–34.0)
MCHC: 34.5 g/dL (ref 30.0–36.0)
MCV: 91.1 fL (ref 78.0–100.0)
MONO ABS: 1 10*3/uL (ref 0.1–1.0)
MONOS PCT: 5 %
Neutro Abs: 14.6 10*3/uL — ABNORMAL HIGH (ref 1.7–7.7)
Neutrophils Relative %: 78 %
Platelets: 358 10*3/uL (ref 150–400)
RBC: 4.84 MIL/uL (ref 3.87–5.11)
RDW: 12.8 % (ref 11.5–15.5)
WBC: 18.8 10*3/uL — ABNORMAL HIGH (ref 4.0–10.5)

## 2017-04-26 LAB — I-STAT BETA HCG BLOOD, ED (MC, WL, AP ONLY)

## 2017-04-26 LAB — LIPASE, BLOOD: Lipase: 25 U/L (ref 11–51)

## 2017-04-26 MED ORDER — IOPAMIDOL (ISOVUE-300) INJECTION 61%
100.0000 mL | Freq: Once | INTRAVENOUS | Status: AC | PRN
  Administered 2017-04-26: 100 mL via INTRAVENOUS

## 2017-04-26 MED ORDER — LORAZEPAM 2 MG/ML IJ SOLN
1.0000 mg | Freq: Once | INTRAMUSCULAR | Status: AC
  Administered 2017-04-26: 1 mg via INTRAVENOUS
  Filled 2017-04-26: qty 1

## 2017-04-26 MED ORDER — ALUM & MAG HYDROXIDE-SIMETH 200-200-20 MG/5ML PO SUSP
30.0000 mL | Freq: Four times a day (QID) | ORAL | Status: DC | PRN
  Administered 2017-04-26 – 2017-04-27 (×2): 30 mL via ORAL
  Filled 2017-04-26 (×2): qty 30

## 2017-04-26 MED ORDER — ACETAMINOPHEN 325 MG PO TABS: 650.0000 mg | ORAL_TABLET | Freq: Four times a day (QID) | ORAL | Status: DC | PRN

## 2017-04-26 MED ORDER — ONDANSETRON HCL 4 MG PO TABS: 4.0000 mg | ORAL_TABLET | Freq: Four times a day (QID) | ORAL | Status: DC | PRN

## 2017-04-26 MED ORDER — ONDANSETRON HCL 4 MG/2ML IJ SOLN
4.0000 mg | Freq: Four times a day (QID) | INTRAMUSCULAR | Status: DC | PRN
  Administered 2017-04-26 – 2017-04-27 (×3): 4 mg via INTRAVENOUS
  Filled 2017-04-26 (×3): qty 2

## 2017-04-26 MED ORDER — IOPAMIDOL (ISOVUE-300) INJECTION 61%
INTRAVENOUS | Status: AC
  Filled 2017-04-26: qty 30

## 2017-04-26 MED ORDER — SODIUM CHLORIDE 0.9 % IV BOLUS (SEPSIS)
1000.0000 mL | Freq: Once | INTRAVENOUS | Status: AC
  Administered 2017-04-26: 1000 mL via INTRAVENOUS

## 2017-04-26 MED ORDER — ACETAMINOPHEN 650 MG RE SUPP: 650.0000 mg | Freq: Four times a day (QID) | RECTAL | Status: DC | PRN

## 2017-04-26 MED ORDER — KCL IN DEXTROSE-NACL 20-5-0.45 MEQ/L-%-% IV SOLN
INTRAVENOUS | Status: DC
  Administered 2017-04-26 – 2017-04-27 (×3): via INTRAVENOUS

## 2017-04-26 MED ORDER — CIPROFLOXACIN IN D5W 400 MG/200ML IV SOLN
400.0000 mg | Freq: Once | INTRAVENOUS | Status: AC
  Administered 2017-04-26: 400 mg via INTRAVENOUS
  Filled 2017-04-26: qty 200

## 2017-04-26 MED ORDER — ACYCLOVIR 200 MG PO CAPS
400.0000 mg | ORAL_CAPSULE | Freq: Three times a day (TID) | ORAL | Status: DC
  Administered 2017-04-27: 400 mg via ORAL
  Filled 2017-04-26 (×3): qty 2

## 2017-04-26 MED ORDER — TECHNETIUM TC 99M MEBROFENIN IV KIT
5.0000 | PACK | Freq: Once | INTRAVENOUS | Status: AC | PRN
  Administered 2017-04-26: 5.5 via INTRAVENOUS

## 2017-04-26 MED ORDER — ONDANSETRON HCL 4 MG/2ML IJ SOLN
INTRAMUSCULAR | Status: AC
  Administered 2017-04-26: 4 mg
  Filled 2017-04-26: qty 2

## 2017-04-26 MED ORDER — ONDANSETRON HCL 4 MG/5ML PO SOLN: 4.0000 mg | Freq: Once | ORAL | Status: DC

## 2017-04-26 MED ORDER — SODIUM CHLORIDE 0.9 % IV SOLN
INTRAVENOUS | Status: AC
  Filled 2017-04-26 (×2): qty 3

## 2017-04-26 MED ORDER — LORAZEPAM 2 MG/ML IJ SOLN
0.5000 mg | Freq: Four times a day (QID) | INTRAMUSCULAR | Status: DC | PRN
Start: 2017-04-26 — End: 2017-04-27
  Administered 2017-04-26 – 2017-04-27 (×3): 0.5 mg via INTRAVENOUS
  Filled 2017-04-26 (×3): qty 1

## 2017-04-26 MED ORDER — BISACODYL 10 MG RE SUPP
10.0000 mg | Freq: Every day | RECTAL | Status: DC
  Administered 2017-04-26 – 2017-04-27 (×2): 10 mg via RECTAL
  Filled 2017-04-26 (×2): qty 1

## 2017-04-26 MED ORDER — AMOXICILLIN-POT CLAVULANATE 875-125 MG PO TABS
1.0000 | ORAL_TABLET | Freq: Two times a day (BID) | ORAL | Status: DC
  Filled 2017-04-26: qty 1

## 2017-04-26 MED ORDER — PROMETHAZINE HCL 25 MG RE SUPP
12.5000 mg | Freq: Three times a day (TID) | RECTAL | Status: DC | PRN
  Administered 2017-04-26 – 2017-04-27 (×2): 12.5 mg via RECTAL
  Filled 2017-04-26 (×2): qty 1

## 2017-04-26 MED ORDER — METOCLOPRAMIDE HCL 5 MG/ML IJ SOLN
10.0000 mg | Freq: Once | INTRAMUSCULAR | Status: AC
  Administered 2017-04-26: 10 mg via INTRAVENOUS
  Filled 2017-04-26: qty 2

## 2017-04-26 MED ORDER — MORPHINE SULFATE (PF) 4 MG/ML IV SOLN
4.0000 mg | INTRAVENOUS | Status: DC | PRN
  Administered 2017-04-26 – 2017-04-27 (×6): 4 mg via INTRAVENOUS
  Filled 2017-04-26 (×6): qty 1

## 2017-04-26 MED ORDER — MORPHINE SULFATE (PF) 4 MG/ML IV SOLN
4.0000 mg | INTRAVENOUS | Status: DC | PRN
  Administered 2017-04-26 (×3): 4 mg via INTRAVENOUS
  Filled 2017-04-26 (×3): qty 1

## 2017-04-26 MED ORDER — SODIUM CHLORIDE 0.9 % IV SOLN
3.0000 g | Freq: Three times a day (TID) | INTRAVENOUS | Status: DC
  Administered 2017-04-26 – 2017-04-27 (×3): 3 g via INTRAVENOUS
  Filled 2017-04-26 (×7): qty 3

## 2017-04-26 MED ORDER — CIPROFLOXACIN HCL 250 MG PO TABS
500.0000 mg | ORAL_TABLET | Freq: Once | ORAL | Status: DC
  Filled 2017-04-26: qty 2

## 2017-04-26 MED ORDER — MORPHINE SULFATE (PF) 2 MG/ML IV SOLN
2.0000 mg | Freq: Once | INTRAVENOUS | Status: AC
  Administered 2017-04-26: 2 mg via INTRAVENOUS
  Filled 2017-04-26: qty 1

## 2017-04-26 NOTE — ED Notes (Signed)
Pt came back from nuclear med study and continues to c/o severe pain and nausea despite last dose of Morphine and Zofran at 1500. Dr. Sarajane Jews notified. Orders given for 1 time dose of Morphine 2mg  IV and Phenergan 12.5mg  suppository. Medications given to pt. Amy, RN on 3rd floor updated on report of new medications given since last RN report at 88.

## 2017-04-26 NOTE — ED Notes (Signed)
Nuclear med tech came to ED to notify RN that pt was c/o abdominal pain and nausea while undergoing nuclear med scan. Admitting orders released for Morphine and Zofran. Medications given to pt.

## 2017-04-26 NOTE — ED Triage Notes (Signed)
Pt c/o severe upper abd pain, n/v.  Reports had gallbladder out approx 1 week ago but was having the same symptoms before she had her gall bladder out.

## 2017-04-26 NOTE — ED Notes (Signed)
Pt not able to drink oral contrast.

## 2017-04-26 NOTE — ED Notes (Signed)
PT pulling clothes off, restless, vomiting, and says, "I hot and in pain."  C/o upper right and left upper abdominal pain.  Says she just had gallbladder surgery about week ago.  Having difficulty with bowles moving.  Attempt enema and stool softener with little success.

## 2017-04-26 NOTE — ED Notes (Signed)
Pt offered oral antibiotics. Pt refuses any oral medications at this time. EDP notified.

## 2017-04-26 NOTE — ED Notes (Signed)
Dr Laverta Baltimore and CT tech [facility]) informed that pt is not able to drink oral contrast.

## 2017-04-26 NOTE — ED Notes (Signed)
Pt still off the floor in nuclear medicine at this time.

## 2017-04-26 NOTE — H&P (Signed)
History and Physical  Debbie Santana XHB:[id number] DOB: [birth year] DOA: 04/26/2017  PCP: Patient, No Pcp Per  Patient coming from: home  Chief Complaint: abdominal pain  HPI:  26yow PMH recurrent n/v/abdominal pain, IBS, s/p cholecystectomy 7/26, presented to the emergency department with sudden onset of abdominal pain, nausea and vomiting. Referred for further evaluation of abdominal pain, nausea and vomiting.  Patient was recently discharged 7/27 after undergoing laparoscopic cholecystectomy for chronic cholecystitis. During that hospitalization she was evaluated by gastroenterology as well and underwent EGD which showed mild reflux esophagitis but no evidence of peptic ulcer disease. Gastric biopsy was negative for H. pylori. HIDA scan EF 10%.   Since the surgery she's been doing well and was most recently seen by her surgeon 8/2 at which time she was noted to be doing well, staples removed and she was cleared to return to work in 12 days.  She suddenly developed severe abdominal pain, generalized, this morning approximately 2 AM. Stabbing type pain. Associated with nausea and vomiting. Relieved somewhat with pain medication the emergency department. In discussion with the patient and her mother, she's had recurrent episodes of nausea, vomiting and abdominal pain, particular severe over the last few months, however occurring over the last several years. She has had irritable bowel since age 26. She's never had this severe of an attack.  ED Course: Afebrile, tachycardic, vitals otherwise unremarkable. Treated with lorazepam, Reglan, morphine, Zofran, ciprofloxacin. EDP discussed with general surgeon on call who felt that bile leak was unlikely and recommended oral antibiotics, outpatient HIDA scan 8/6 and discharge home. However, the patient could not tolerate any oral intake, continued to have nausea and vomiting and thus was referred for admission.  Review of Systems:  Negative for fever but  does report feeling hot and cold, negative for visual changes, sore throat, rash, new muscle aches, has pain, shortness of breath or dysuria, bleeding.  Positive for constipation.  Past Medical History:  Diagnosis Date  . Bradycardia 04/17/2017  . Chronic back pain   . 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 Gastric ulcer   . Peptic ulcer disease 04/13/2017  . Sciatica     Past Surgical History:  Procedure Laterality Date  . BACK SURGERY    . BIOPSY  04/14/2017   Procedure: BIOPSY;  Surgeon: Rogene Houston, MD;  Location: [facility];  Service: Endoscopy;;  gastric  . CHOLECYSTECTOMY N/A 04/17/2017   Procedure: LAPAROSCOPIC CHOLECYSTECTOMY;  Surgeon: Aviva Signs, MD;  Location: [facility];  Service: General;  Laterality: N/A;  . ESOPHAGOGASTRODUODENOSCOPY (EGD) WITH PROPOFOL N/A 04/14/2017   Procedure: ESOPHAGOGASTRODUODENOSCOPY (EGD) WITH PROPOFOL;  Surgeon: Rogene Houston, MD;  Location: [facility];  Service: Endoscopy;  Laterality: N/A;  . HERNIA REPAIR       reports that she has been smoking Cigarettes.  She has been smoking about 1.00 pack per day. She has never used smokeless tobacco. She reports that she uses drugs, including Marijuana. She reports that she does not drink alcohol. Mobility: Ambulatory  No Known Allergies  Family History  Problem Relation Age of Onset  . Anxiety disorder Mother      Prior to Admission medications   Medication Sig Start Date End Date Taking? Authorizing Provider  acyclovir (ZOVIRAX) 200 MG capsule Take 400 mg by mouth 3 (three) times daily.   Yes [provider]  imiquimod (ALDARA) 5 % cream Apply 1 application topically 3 (three) times a week. Has not started using yet. 04/23/17  Yes [provider]  omeprazole (PRILOSEC) 20 MG capsule Take 1 capsule (20 mg total) by mouth 2 (two) times daily before a meal. 04/18/17  Yes Aviva Signs, MD  ondansetron (ZOFRAN) 4 MG tablet Take 1 tablet (4 mg total) by mouth every 8 (eight)  hours as needed for nausea or vomiting. 04/18/17  Yes Aviva Signs, MD  promethazine (PHENERGAN) 25 MG tablet Take 1 tablet (25 mg total) by mouth every 6 (six) hours as needed for nausea. 04/10/17  Yes Domenic Moras, PA-C  sodium phosphate Pediatric (FLEET) 3.5-9.5 GM/59ML enema Place 1 enema rectally once.   Yes [provider]    Physical Exam: 98.1, 21, 122, 136/70, 100% on room air.  Constitutional. Appears uncomfortable, in pain, ill but not toxic.  Eyes. Pupils, irises, lids appear unremarkable.  ENT. Grossly normal hearing, lips, tongue.  Neck. No lymphadenopathy or masses. No thyromegaly.  Cardiovascular. Regular rate and rhythm. No murmur, rub or gallop.  Respiratory. Clear to auscultation bilaterally. No wheezes, rales or rhonchi. Normal respiratory effort.  Abdomen. Soft, no rebound. Nondistended. No guarding. Mild generalized tenderness especially in the upper abdomen. Incisions healing well. No exudate or drainage. No lower abdominal pain with palpation. Positive bowel sounds.  Skin. As described above, otherwise unremarkable. No rash or induration. Nontender to palpation.  Musculoskeletal. Grossly normal tone and strength all extremities.  Psychiatric. Grossly normal mood and affect. Speech fluent and appropriate.  Wt Readings from Last 3 Encounters:  04/24/17 85.7 kg (189 lb)  04/17/17 88.5 kg (195 lb)  04/10/17 88.5 kg (195 lb)    I have personally reviewed following labs and imaging studies  Labs:   Complete metabolic panel unremarkable. Lipase within normal limits.  WBC 18.8, remainder CBC unremarkable. Differential with elevated neutrophils. On review the patient has had chronic leukocytosis.  Urine pregnancy negative.  Imaging studies:   CT abdomen and pelvis with 3 x 5 cm fluid collection at the cholecystectomy bed, could represent postoperative changes but infection or bile leak cannot be excluded. No other significant abnormalities  noted.  Medical tests:     Test discussed with performing physician:    Decision to obtain old records:     Review and summation of old records:   As in history of present illness  Principal Problem:   Nausea and vomiting Active Problems:   Intractable abdominal pain   Leukocytosis   Cigarette smoker   Assessment/Plan Recurrent nausea, vomiting, abdominal pain. Multiple episodes of the last few months. Etiology unclear. Modest leukocytosis noted but is chronic in nature. Vitals are stable, no fever. No evidence to suggest severe illness at this point. -IV fluids, antiemetics, analgesics -Serial exams -Gen. surgery consultation, GI consultation 8/5  CT abnormality of the cholecystectomy bed. -Significance unclear, per documentation the surgeon did not think bile leak was likely. Recommendations were for antibiotics and HIDA when available -Cannot tolerate oral medications at this point. IV antibiotics planned. HIDA next week.  Constipation. Likely narcotic induced. -Daily suppository. Enema as needed -Oral laxatives when able to tolerate.  Cigarette smoker -offer nicotine patch  Severity of Illness: The appropriate patient status for this patient is OBSERVATION. Observation status is judged to be reasonable and necessary in order to provide the required intensity of service to ensure the patient's safety. The patient's presenting symptoms, physical exam findings, and initial radiographic and laboratory data in the context of their medical condition is felt to place them at decreased risk for further clinical deterioration. Furthermore, it is anticipated that the patient  will be medically stable for discharge from the hospital within 2 midnights of admission. The following factors support the patient status of observation.   " The patient's presenting symptoms include severe abdominal pain, refractory nausea and vomiting. " The physical exam findings include abdominal  pain with palpation, tachycardia. " The initial radiographic and laboratory data are fluid in the cholecystectomy bed, leukocytosis.   DVT prophylaxis: SCDs Code Status: full Family Communication: mother at bedside   Time spent: 83 minutes  Murray Hodgkins, MD  [facility] Direct contact: [phone] --[place] [place]  --[email or web address]; password [email or web address]  7PM-7AM contact night coverage as above  04/26/2017, 1:34 PM

## 2017-04-26 NOTE — ED Provider Notes (Signed)
Emergency Department Provider Note   I have reviewed the triage vital signs and the nursing notes.   HISTORY  Chief Complaint Abdominal Pain   HPI Debbie Santana is a 26 y.o. female with PMH of chronic back pain, PUD, and chronic cholecystitis 1 week s/p lap chole resents to the emergency department for evaluation of right upper quadrant abdominal pain. Patient states this feels very similar to pain she had before the cholecystectomy. She reports subjective fevers. Her initial postoperative course was unremarkable and yesterday she began to have the familiar right upper quadrant pain. She reports some associated constipation. She continues to have bowel movements but states it's difficult. She's been unable to take her PRN pain medications at home because of vomiting and nausea. Pain radiates to the back. No modifying factors.    Past Medical History:  Diagnosis Date  . Bradycardia 04/17/2017  . Chronic back pain   . 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 Gastric ulcer   . Peptic ulcer disease 04/13/2017  . Sciatica     Patient Active Problem List   Diagnosis Date Noted  . Reflux esophagitis 04/17/2017  . Obesity 04/17/2017  . Bradycardia 04/17/2017  . Tobacco abuse 04/17/2017  . Chronic cholecystitis   . Peptic ulcer disease 04/13/2017  . 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 Intractable abdominal pain 04/11/2017  . Leukocytosis 04/11/2017  . Hyperglycemia 04/11/2017  . Anxiety 04/11/2017  . Nausea and vomiting   . Right hand fracture 10/06/2013  . Fracture of metacarpal shaft of right hand, closed 10/06/2013  . Spontaneous abortion in first trimester 04/21/2013    Past Surgical History:  Procedure Laterality Date  . BACK SURGERY    . BIOPSY  04/14/2017   Procedure: BIOPSY;  Surgeon: Rogene Houston, MD;  Location: [facility];  Service: Endoscopy;;  gastric  . CHOLECYSTECTOMY N/A 04/17/2017   Procedure: LAPAROSCOPIC CHOLECYSTECTOMY;  Surgeon: Aviva Signs, MD;  Location: [facility];  Service: General;  Laterality: N/A;  . ESOPHAGOGASTRODUODENOSCOPY (EGD) WITH PROPOFOL N/A 04/14/2017   Procedure: ESOPHAGOGASTRODUODENOSCOPY (EGD) WITH PROPOFOL;  Surgeon: Rogene Houston, MD;  Location: [facility];  Service: Endoscopy;  Laterality: N/A;  . HERNIA REPAIR      Current Outpatient Rx  . Order #: [id number] Class: Historical Med  . Order #: [id number] Class: Historical Med  . Order #: [id number] Class: Print  . Order #: [id number] Class: Print  . Order #: [id number] Class: Print  . Order #: [id number] Class: Historical Med    Allergies Patient has no known allergies.  Family History  Problem Relation Age of Onset  . Anxiety disorder Mother     Social History Social History  Substance Use Topics  . Smoking status: Current Every Day Smoker    Packs/day: 1.00    Types: Cigarettes  . Smokeless tobacco: Never Used     Comment: never done snuff or chewing tobacco.  . Alcohol use No     Comment: occasionally    Review of Systems  Constitutional: Positive subjective fever/chills Eyes: No visual changes. ENT: No sore throat. Cardiovascular: Denies chest pain. Respiratory: Denies shortness of breath. Gastrointestinal: Positive RUQ abdominal pain. Positive nausea and vomiting.  No diarrhea.  Positive constipation. Genitourinary: Negative for dysuria. Musculoskeletal: Negative for back pain. Skin: Negative for rash. Neurological: Negative for headaches, focal weakness or numbness.  10-point ROS otherwise negative.  ____________________________________________   PHYSICAL EXAM:  VITAL SIGNS: Vitals:   04/27/17 0505 04/27/17 1457  BP: 118/78 124/70  Pulse: 91 (!) 106  Resp:  18 20  Temp: 97.7 F (36.5 C) 97.8 F (36.6 C)   Constitutional: Patient appears uncomfortable. She is standing at bedside and frequently shifting around.  Eyes: Conjunctivae are normal.  Head: Atraumatic. Nose: No congestion/rhinnorhea. Mouth/Throat: Mucous membranes are moist.    Neck: No stridor.   Cardiovascular: Normal rate, regular rhythm. Good peripheral circulation. Grossly normal heart sounds.   Respiratory: Normal respiratory effort.  No retractions. Lungs CTAB. Gastrointestinal: Soft with mild diffuse tenderness. No rebound or guarding. Well-appearing incisions with mild surrounding bruising. No distention.  Musculoskeletal: No lower extremity tenderness nor edema. No gross deformities of extremities. Neurologic:  Normal speech and language. No gross focal neurologic deficits are appreciated.  Skin:  Skin is warm, dry and intact. No rash noted. ____________________________________________   LABS (all labs ordered are listed, but only abnormal results are displayed)  Labs Reviewed  COMPREHENSIVE METABOLIC PANEL - Abnormal; Notable for the following:       Result Value   Glucose, Bld 132 (*)    Calcium 10.9 (*)    All other components within normal limits  CBC WITH DIFFERENTIAL/PLATELET - Abnormal; Notable for the following:    WBC 18.8 (*)    Hemoglobin 15.2 (*)    Neutro Abs 14.6 (*)    All other components within normal limits  LIPASE, BLOOD  URINALYSIS, ROUTINE W REFLEX MICROSCOPIC  I-STAT BETA HCG BLOOD, ED ([facility], [facility], [facility] ONLY)   ____________________________________________  RADIOLOGY  Ct Abdomen Pelvis W Contrast  Result Date: 04/26/2017 CLINICAL DATA:  26 year old female with acute abdominal pain, nausea and vomiting for 1 day. Cholecystectomy on 04/17/2017. EXAM: CT ABDOMEN AND PELVIS WITH CONTRAST TECHNIQUE: Multidetector CT imaging of the abdomen and pelvis was performed using the standard protocol following bolus administration of intravenous contrast. CONTRAST:  149mL ISOVUE-300 IOPAMIDOL (ISOVUE-300) INJECTION 61% COMPARISON:  04/10/2017 CT and prior studies FINDINGS: Lower chest: No acute abnormality Hepatobiliary: The patient is status post cholecystectomy. A 3 x 5 cm fluid collection at the cholecystectomy bed is noted with single focus  of gas. No biliary dilatation. The liver is unremarkable. Pancreas: Unremarkable Spleen: Unremarkable Adrenals/Urinary Tract: The kidneys, adrenal glands and bladder are unremarkable. Stomach/Bowel: Stomach is within normal limits. Appendix appears normal. No evidence of bowel wall thickening, distention, or inflammatory changes. Vascular/Lymphatic: No significant vascular findings are present. No enlarged abdominal or pelvic lymph nodes. Reproductive: Uterus and bilateral adnexa are unremarkable. Other: No ascites or pneumoperitoneum. Musculoskeletal: No acute or significant osseous findings. IMPRESSION: 3 x 5 cm fluid collection at the cholecystectomy bed a single focus of gas. Although this could represent postoperative changes, an infected collection or biloma/bile leak is not excluded. No ascites or biliary dilatation. No other significant abnormalities. Electronically Signed   By: Margarette Canada M.D.   On: 04/26/2017 10:40    ____________________________________________   PROCEDURES  Procedure(s) performed:   Procedures  None ____________________________________________   INITIAL IMPRESSION / ASSESSMENT AND PLAN / ED COURSE  Pertinent labs & imaging results that were available during my care of the patient were reviewed by me and considered in my medical decision making (see chart for details).  Patient resents to the emergency department for evaluation of right upper quadrant abdominal pain radiating to the back with associated nausea, vomiting, and constipation symptoms. She had a laparoscopic cholecystectomy 1 week prior for similar pain. Pain symptoms began yesterday. Patient appears uncomfortable but her abdomen is relatively soft. Low suspicion for acute surgical process in the abdomen but given her recent surgery  plan for CT scan of the abdomen and pelvis with contrast to evaluate for possible postoperative infection versus partial small bowel obstruction. Patient is asking for  Dilaudid. Will provide Morphine, Ativan, Zofran, and IVF. Suspect that this presentation is most likley related to chronic, intermittent abdominal pain and vomiting.   11:13 AM Spoke with Dr. Dot Lanes on call with general surgery to review case and imaging. Low suspicion for bile leak or infection. Most likely post-surgical changes. Patient feeling better after Reglan. Recommends coverage with Augmentin and Cipro. Requests HIDA scan on Monday with results to be called to Dr. Tama High who will be covering at that time. I placed the outpatient order as requested.   Patient continues to vomit. Admitted to hospitalist.   Discussed patient's case with Hospitalist. Patient and family (if present) updated with plan. Care transferred to Hospitalist service.  I reviewed all nursing notes, vitals, pertinent old records, EKGs, labs, imaging (as available).  ____________________________________________  FINAL CLINICAL IMPRESSION(S) / ED DIAGNOSES  Final diagnoses:  Right upper quadrant abdominal pain  Intractable cyclical vomiting with nausea     MEDICATIONS GIVEN DURING THIS VISIT:  Medications  morphine 4 MG/ML injection 4 mg (4 mg Intravenous Given 04/26/17 1001)  LORazepam (ATIVAN) injection 1 mg (not administered)  ondansetron (ZOFRAN) 4 MG/2ML injection (4 mg  Given 04/26/17 0851)  sodium chloride 0.9 % bolus 1,000 mL (0 mLs Intravenous Stopped 04/26/17 1000)  LORazepam (ATIVAN) injection 1 mg (1 mg Intravenous Given 04/26/17 0852)  iopamidol (ISOVUE-300) 61 % injection 100 mL (100 mLs Intravenous Contrast Given 04/26/17 1016)  metoCLOPramide (REGLAN) injection 10 mg (10 mg Intravenous Given 04/26/17 1001)     NEW OUTPATIENT MEDICATIONS STARTED DURING THIS VISIT:  None   Note:  This document was prepared using Dragon voice recognition software and may include unintentional dictation errors.  Nanda Quinton, MD Emergency Medicine    Long, [facility], MD 04/27/17 [phone]

## 2017-04-26 NOTE — Progress Notes (Signed)
Pharmacy Antibiotic Note  Debbie Santana is a 26 y.o. female admitted on 04/26/2017 with intra abdominal infection.  Pharmacy has been consulted for unasyn dosing.  Plan: Unasyn 3gm IV q8 hours F/u cultures and clinical course  Height: 5\' 7"  (170.2 cm) Weight: 189 lb (85.7 kg) IBW/kg (Calculated) : 61.6 unasyn Temp (24hrs), Avg:98.4 F (36.9 C), Min:98.1 F (36.7 C), Max:98.6 F (37 C)   Recent Labs Lab 04/26/17 0849  WBC 18.8*  CREATININE 0.87    Estimated Creatinine Clearance: 110.1 mL/min (by C-G formula based on SCr of 0.87 mg/dL).    No Known Allergies   Thank you for allowing pharmacy to be a part of this patient's care.  Excell Seltzer Poteet 04/26/2017 6:30 PM

## 2017-04-27 LAB — CBC
HCT: 36.2 % (ref 36.0–46.0)
Hemoglobin: 12.3 g/dL (ref 12.0–15.0)
MCH: 31.5 pg (ref 26.0–34.0)
MCHC: 34 g/dL (ref 30.0–36.0)
MCV: 92.8 fL (ref 78.0–100.0)
PLATELETS: 235 10*3/uL (ref 150–400)
RBC: 3.9 MIL/uL (ref 3.87–5.11)
RDW: 13 % (ref 11.5–15.5)
WBC: 9.2 10*3/uL (ref 4.0–10.5)

## 2017-04-27 LAB — COMPREHENSIVE METABOLIC PANEL
ALK PHOS: 53 U/L (ref 38–126)
ALT: 15 U/L (ref 14–54)
AST: 15 U/L (ref 15–41)
Albumin: 3.4 g/dL — ABNORMAL LOW (ref 3.5–5.0)
Anion gap: 7 (ref 5–15)
BILIRUBIN TOTAL: 0.5 mg/dL (ref 0.3–1.2)
BUN: 5 mg/dL — ABNORMAL LOW (ref 6–20)
CHLORIDE: 106 mmol/L (ref 101–111)
CO2: 26 mmol/L (ref 22–32)
CREATININE: 0.73 mg/dL (ref 0.44–1.00)
Calcium: 9.1 mg/dL (ref 8.9–10.3)
GFR calc Af Amer: >60 mL/min (ref >60)
Glucose, Bld: 106 mg/dL — ABNORMAL HIGH (ref 65–99)
Potassium: 3.3 mmol/L — ABNORMAL LOW (ref 3.5–5.1)
Sodium: 139 mmol/L (ref 135–145)
TOTAL PROTEIN: 6.1 g/dL — AB (ref 6.5–8.1)

## 2017-04-27 MED ORDER — PANTOPRAZOLE SODIUM 40 MG PO TBEC
40.0000 mg | DELAYED_RELEASE_TABLET | Freq: Every day | ORAL | Status: DC
  Administered 2017-04-27: 40 mg via ORAL
  Filled 2017-04-27: qty 1

## 2017-04-27 MED ORDER — PANTOPRAZOLE SODIUM 40 MG PO TBEC
40.0000 mg | DELAYED_RELEASE_TABLET | Freq: Every day | ORAL | Status: DC
Start: 2017-04-27 — End: 2017-04-27

## 2017-04-27 MED ORDER — TRAMADOL HCL 50 MG PO TABS
50.0000 mg | ORAL_TABLET | Freq: Once | ORAL | Status: AC
  Administered 2017-04-27: 50 mg via ORAL
  Filled 2017-04-27: qty 1

## 2017-04-27 MED ORDER — CALCIUM CARBONATE ANTACID 500 MG PO CHEW
1.0000 | CHEWABLE_TABLET | Freq: Three times a day (TID) | ORAL | Status: DC
  Administered 2017-04-27: 200 mg via ORAL
  Filled 2017-04-27: qty 1

## 2017-04-27 MED ORDER — LORAZEPAM 0.5 MG PO TABS
0.5000 mg | ORAL_TABLET | Freq: Four times a day (QID) | ORAL | Status: DC | PRN
  Administered 2017-04-27: 0.5 mg via ORAL
  Filled 2017-04-27 (×2): qty 1

## 2017-04-27 MED ORDER — POLYETHYLENE GLYCOL 3350 17 G PO PACK
17.0000 g | PACK | Freq: Two times a day (BID) | ORAL | Status: DC
  Administered 2017-04-27: 17 g via ORAL
  Filled 2017-04-27: qty 1

## 2017-04-27 MED ORDER — AMOXICILLIN-POT CLAVULANATE 875-125 MG PO TABS: 1.0000 | ORAL_TABLET | Freq: Two times a day (BID) | ORAL | 0 refills | Status: DC

## 2017-04-27 NOTE — Progress Notes (Signed)
Subjective/Chief Complaint: No further nausea or vomiting Patient has tolerated a diet today No abdominal pain Afebrile HIDA scan negative for leak WBC normal   Objective: Vital signs in last 24 hours: Temp:  [97.7 F (36.5 C)-98.6 F (37 C)] 97.7 F (36.5 C) (08/05 0505) Pulse Rate:  [52-110] 91 (08/05 0505) Resp:  [18-22] 18 (08/05 0505) BP: (118-134)/(73-96) 118/78 (08/05 0505) SpO2:  [96 %-100 %] 99 % (08/05 0505) Weight:  [85.7 kg (189 lb)] 85.7 kg (189 lb) (08/04 1609) Last BM Date: 04/27/17  Intake/Output from previous day: 08/04 0701 - 08/05 0700 In: 2775 [I.V.:1375; IV Piggyback:1400] Out: 200 [Urine:200] Intake/Output this shift: No intake/output data recorded.  General appearance: alert, cooperative and no distress GI: soft, non-tender; + BS Incisions c/d/i  Lab Results:   Recent Labs  04/26/17 0849 04/27/17 0554  WBC 18.8* 9.2  HGB 15.2* 12.3  HCT 44.1 36.2  PLT 358 235   BMET  Recent Labs  04/26/17 0849 04/27/17 0554  NA 143 139  K 3.9 3.3*  CL 107 106  CO2 22 26  GLUCOSE 132* 106*  BUN 9 5*  CREATININE 0.87 0.73  CALCIUM 10.9* 9.1   PT/INR No results for input(s): LABPROT, INR in the last 72 hours. ABG No results for input(s): PHART, HCO3 in the last 72 hours.  Invalid input(s): PCO2, PO2  Studies/Results: Nm Hepatobiliary Including Gb  Result Date: 04/26/2017 CLINICAL DATA:  Abdominal pain and nausea since last night. Cholecystectomy on July 26. EXAM: NUCLEAR MEDICINE HEPATOBILIARY IMAGING TECHNIQUE: Sequential images of the abdomen were obtained out to 60 minutes following intravenous administration of radiopharmaceutical. RADIOPHARMACEUTICALS:  5.5  mCi Tc-75m  Choletec IV COMPARISON:  None. FINDINGS: Prompt uptake and biliary excretion of activity by the liver is seen. Biliary activity passes into small bowel, consistent with patent common bile duct. No extraluminal radiotracer seen. IMPRESSION: 1. Patent common bile duct. 2.  No biliary leakage identified. Electronically Signed   By: Franki Cabot M.D.   On: 04/26/2017 15:55   Ct Abdomen Pelvis W Contrast  Result Date: 04/26/2017 CLINICAL DATA:  26 year old female with acute abdominal pain, nausea and vomiting for 1 day. Cholecystectomy on 04/17/2017. EXAM: CT ABDOMEN AND PELVIS WITH CONTRAST TECHNIQUE: Multidetector CT imaging of the abdomen and pelvis was performed using the standard protocol following bolus administration of intravenous contrast. CONTRAST:  137mL ISOVUE-300 IOPAMIDOL (ISOVUE-300) INJECTION 61% COMPARISON:  04/10/2017 CT and prior studies FINDINGS: Lower chest: No acute abnormality Hepatobiliary: The patient is status post cholecystectomy. A 3 x 5 cm fluid collection at the cholecystectomy bed is noted with single focus of gas. No biliary dilatation. The liver is unremarkable. Pancreas: Unremarkable Spleen: Unremarkable Adrenals/Urinary Tract: The kidneys, adrenal glands and bladder are unremarkable. Stomach/Bowel: Stomach is within normal limits. Appendix appears normal. No evidence of bowel wall thickening, distention, or inflammatory changes. Vascular/Lymphatic: No significant vascular findings are present. No enlarged abdominal or pelvic lymph nodes. Reproductive: Uterus and bilateral adnexa are unremarkable. Other: No ascites or pneumoperitoneum. Musculoskeletal: No acute or significant osseous findings. IMPRESSION: 3 x 5 cm fluid collection at the cholecystectomy bed a single focus of gas. Although this could represent postoperative changes, an infected collection or biloma/bile leak is not excluded. No ascites or biliary dilatation. No other significant abnormalities. Electronically Signed   By: Margarette Canada M.D.   On: 04/26/2017 10:40    Anti-infectives: Anti-infectives    Start     Dose/Rate Route Frequency Ordered Stop   04/26/17 1630  Ampicillin-Sulbactam (UNASYN) 3 g in sodium chloride 0.9 % 100 mL IVPB     3 g 200 mL/hr over 30 Minutes  Intravenous Every 8 hours 04/26/17 1619     04/26/17 1615  acyclovir (ZOVIRAX) 200 MG capsule 400 mg     400 mg Oral 3 times daily 04/26/17 1606     04/26/17 1215  ciprofloxacin (CIPRO) IVPB 400 mg     400 mg 200 mL/hr over 60 Minutes Intravenous  Once 04/26/17 1210 04/26/17 1407   04/26/17 1130  amoxicillin-clavulanate (AUGMENTIN) 875-125 MG per tablet 1 tablet  Status:  Discontinued     1 tablet Oral Every 12 hours 04/26/17 1115 04/26/17 1606   04/26/17 1115  ciprofloxacin (CIPRO) tablet 500 mg  Status:  Discontinued     500 mg Oral  Once 04/26/17 1115 04/26/17 1606      Assessment/Plan: S/p Laparoscopic cholecystectomy by Dr. Arnoldo Morale - admitted with nausea, vomiting, WBC.  No sign of leak or abscess OK for discharge Follow-up with Dr. Arnoldo Morale as needed.   LOS: 0 days    Lyle Niblett K. 04/27/2017

## 2017-04-27 NOTE — Progress Notes (Signed)
Discharge instructions read to patient. Pt verbalized understanding  Discharged to home with friend.

## 2017-04-27 NOTE — Progress Notes (Signed)
Patient ID: Debbie Santana, female   DOB: [birth year], 26 y.o.   MRN: [id number]  Covering for Dr. Arnoldo Morale I will come see the patient later today.  HIDA showed no sign of leak WBC normal today Would advance diet as tolerated If patient continues to have symptoms and her WBC increases again, would reimage the fluid collection in the GB fossa to see if it has increased or shows any sign of rim enhancement.  Would ask radiology to aspirate this fluid collection if there is any suspicion for abscess, although at this time that suspicion is very low.  Imogene Burn. Georgette Dover, MD, [facility]  General/ Trauma Surgery  04/27/2017 9:07 AM

## 2017-04-27 NOTE — Progress Notes (Signed)
  PROGRESS NOTE  Debbie Santana YYT:[id number] DOB: July 28, 1991 DOA: 04/26/2017 PCP: Patient, No Pcp Per  Brief Narrative: 51yow PMH recurrent n/v/abdominal pain, IBS, s/p cholecystectomy 7/26, presented to the emergency department with sudden onset of abdominal pain, nausea and vomiting. Referred for further evaluation of abdominal pain, nausea and vomiting.  Assessment/Plan Recurrent nausea, vomiting, abdominal pain. History of multiple episodes. -Nearly resolved at this point. Advance to clear liquids. If tolerates diet, advance to soft diet.  CT abnormality of the cholecystectomy bed -Significance unclear. HIDA scan was unremarkable. Not likely to represent infection per surgery. Leukocytosis has resolved and symptoms of nausea, vomiting and abdominal pain of largely resolved. -As diet and advance as, change to oral antibiotics -Follow surgery recommendations  Narcotic induced constipation -Bowel regimen  Cigarette smoker -Recommend cessation.  Improving. If tolerates diet, likely home later today, pending surgery recommendations   Murray Hodgkins, MD  [facility] Direct contact: [phone] --Via amion app OR  --[email or web address]; password [email or web address]  7PM-7AM contact night coverage as above 04/27/2017, 10:50 AM  LOS: 0 days   Consultants:  General surgery  Procedures:    Antimicrobials:  Ampicillin sulbactam 8/4 >> 8/5  Augmentin 8/5 >> 8/10  Interval history/Subjective: Feels much better today. Some nausea but no vomiting. Tolerating crackers. Wants to advance diet. Pain is markedly reduced.  Objective: Vitals: Afebrile, 97.7, 18, 91, 118/78, 99% on room air  Exam:     Constitutional: Appears calm, comfortable. Appears much better today.  Cardiovascular. Regular rate and rhythm. Murmur, rub or gallop.  Respiratory. Clear to auscultation bilaterally. No wheezes, rales or rhonchi. Normal respiratory effort.  Abdomen. Soft, nontender, nondistended. No right  upper quadrant pain.  Psychiatric. Grossly normal mood and affect. Speech fluent and appropriate.  I have personally reviewed the following:   Labs:  Potassium 3.3, remainder complete metabolic panel unremarkable.  Leukocytosis has resolved, 9.2 today. Remainder CBC unremarkable.  Imaging studies:  HIDA scan was unremarkable.  Medical tests:     Test discussed with performing physician:    Decision to obtain old records:    Review and summation of old records:    Scheduled Meds: . acyclovir  400 mg Oral TID  . bisacodyl  10 mg Rectal Daily  . calcium carbonate  1 tablet Oral TID  . pantoprazole  40 mg Oral Daily  . polyethylene glycol  17 g Oral BID   Continuous Infusions: . ampicillin-sulbactam (UNASYN) IV Stopped (04/27/17 0411)  . dextrose 5 % and 0.45 % NaCl with KCl 20 mEq/L 125 mL/hr at 04/27/17 0154    Principal Problem:   Nausea and vomiting Active Problems:   Intractable abdominal pain   Leukocytosis   Cigarette smoker   LOS: 0 days

## 2017-04-27 NOTE — Discharge Summary (Signed)
Physician Discharge Summary  RAELEE ROSSMANN Santana:[id number] DOB: 05-18-1991 DOA: 04/26/2017  PCP: Patient, No Pcp Per  Admit date: 04/26/2017 Discharge date: 04/27/2017  Recommendations for Outpatient Follow-up:  1. Nausea, vomiting, abdominal pain  Follow-up Information    Aviva Signs, MD Follow up.   Specialty:  General Surgery Why:  as needed Contact information: [address] [place] [facility] [address] [phone]            Discharge Diagnoses:  1. Nausea, vomiting, abdominal pain 2. CT abnormality of the cholecystectomy bed 3. Narcotic induced constipation 4. Cigarette smoker  Discharge Condition: Improved Disposition: Home  Diet recommendation: Regular  Filed Weights   04/26/17 1609  Weight: 85.7 kg (189 lb)    History of present illness:  25yow PMH recurrent n/v/abdominal pain, IBS, s/p cholecystectomy 7/26, presented to the emergency department with sudden onset of abdominal pain, nausea and vomiting. Referred for further evaluation of abdominal pain, nausea and vomiting.  Hospital Course:  She was hospitalized overnight, had spontaneous resolution of nausea, vomiting and abdominal pain. The following day diet was advanced without difficulty. She was seen by general surgery and cleared for discharge. Given rapid clinical improvement, GI consultation was canceled. Hospitalization was uncomplicated. While it is not clear what the significance of the CT abnormality is, will treat with antibiotics as initially recommended.  Consultants: General surgery  Discharge Instructions  Discharge Instructions    Activity as tolerated - No restrictions    Complete by:  As directed    Diet general    Complete by:  As directed    Discharge instructions    Complete by:  As directed    Call your physician or seek immediate medical attention for pain, fever, vomiting or worsening of condition.     Allergies as of 04/27/2017   No Known Allergies     Medication  List    TAKE these medications   acyclovir 200 MG capsule Commonly known as:  ZOVIRAX Take 400 mg by mouth 3 (three) times daily.   amoxicillin-clavulanate 875-125 MG tablet Commonly known as:  AUGMENTIN Take 1 tablet by mouth 2 (two) times daily.   imiquimod 5 % cream Commonly known as:  ALDARA Apply 1 application topically 3 (three) times a week. Has not started using yet.   omeprazole 20 MG capsule Commonly known as:  PRILOSEC Take 1 capsule (20 mg total) by mouth 2 (two) times daily before a meal.   ondansetron 4 MG tablet Commonly known as:  ZOFRAN Take 1 tablet (4 mg total) by mouth every 8 (eight) hours as needed for nausea or vomiting.   promethazine 25 MG tablet Commonly known as:  PHENERGAN Take 1 tablet (25 mg total) by mouth every 6 (six) hours as needed for nausea.   sodium phosphate Pediatric 3.5-9.5 GM/59ML enema Place 1 enema rectally once.      No Known Allergies  The results of significant diagnostics from this hospitalization (including imaging, microbiology, ancillary and laboratory) are listed below for reference.    Significant Diagnostic Studies: Nm Hepatobiliary Including Gb  Result Date: 04/26/2017 CLINICAL DATA:  Abdominal pain and nausea since last night. Cholecystectomy on July 26. EXAM: NUCLEAR MEDICINE HEPATOBILIARY IMAGING TECHNIQUE: Sequential images of the abdomen were obtained out to 60 minutes following intravenous administration of radiopharmaceutical. RADIOPHARMACEUTICALS:  5.5  mCi Tc-45m  Choletec IV COMPARISON:  None. FINDINGS: Prompt uptake and biliary excretion of activity by the liver is seen. Biliary activity passes into small bowel, consistent with patent common  bile duct. No extraluminal radiotracer seen. IMPRESSION: 1. Patent common bile duct. 2. No biliary leakage identified. Electronically Signed   By: Franki Cabot M.D.   On: 04/26/2017 15:55   Ct Abdomen Pelvis W Contrast  Result Date: 04/26/2017 CLINICAL DATA:  26 year old  female with acute abdominal pain, nausea and vomiting for 1 day. Cholecystectomy on 04/17/2017. EXAM: CT ABDOMEN AND PELVIS WITH CONTRAST TECHNIQUE: Multidetector CT imaging of the abdomen and pelvis was performed using the standard protocol following bolus administration of intravenous contrast. CONTRAST:  169mL ISOVUE-300 IOPAMIDOL (ISOVUE-300) INJECTION 61% COMPARISON:  04/10/2017 CT and prior studies FINDINGS: Lower chest: No acute abnormality Hepatobiliary: The patient is status post cholecystectomy. A 3 x 5 cm fluid collection at the cholecystectomy bed is noted with single focus of gas. No biliary dilatation. The liver is unremarkable. Pancreas: Unremarkable Spleen: Unremarkable Adrenals/Urinary Tract: The kidneys, adrenal glands and bladder are unremarkable. Stomach/Bowel: Stomach is within normal limits. Appendix appears normal. No evidence of bowel wall thickening, distention, or inflammatory changes. Vascular/Lymphatic: No significant vascular findings are present. No enlarged abdominal or pelvic lymph nodes. Reproductive: Uterus and bilateral adnexa are unremarkable. Other: No ascites or pneumoperitoneum. Musculoskeletal: No acute or significant osseous findings. IMPRESSION: 3 x 5 cm fluid collection at the cholecystectomy bed a single focus of gas. Although this could represent postoperative changes, an infected collection or biloma/bile leak is not excluded. No ascites or biliary dilatation. No other significant abnormalities. Electronically Signed   By: Margarette Canada M.D.   On: 04/26/2017 10:40   Ct Abdomen Pelvis W Contrast  Result Date: 04/10/2017 CLINICAL DATA:  Nausea and vomiting for several months EXAM: CT ABDOMEN AND PELVIS WITH CONTRAST TECHNIQUE: Multidetector CT imaging of the abdomen and pelvis was performed using the standard protocol following bolus administration of intravenous contrast. CONTRAST:  125mL ISOVUE-300 IOPAMIDOL (ISOVUE-300) INJECTION 61% COMPARISON:  Ultrasound from  earlier in the same day. FINDINGS: Lower chest: No acute abnormality. Hepatobiliary: No focal liver abnormality is seen. No gallstones, gallbladder wall thickening, or biliary dilatation. Pancreas: Unremarkable. No pancreatic ductal dilatation or surrounding inflammatory changes. Spleen: Normal in size without focal abnormality. Adrenals/Urinary Tract: Adrenal glands are unremarkable. Kidneys are normal, without renal calculi, focal lesion, or hydronephrosis. Bladder is decompressed. Stomach/Bowel: Stomach is within normal limits. Appendix appears normal. No evidence of bowel wall thickening, distention, or inflammatory changes. Vascular/Lymphatic: No significant vascular findings are present. No enlarged abdominal or pelvic lymph nodes. Reproductive: Uterus and bilateral adnexa are unremarkable. Other: No abdominal wall hernia or abnormality. No abdominopelvic ascites. Musculoskeletal: No acute or significant osseous findings. IMPRESSION: No acute abnormality noted. Electronically Signed   By: Inez Catalina M.D.   On: 04/10/2017 12:35   Nm Hepato W/eject Fract  Result Date: 04/15/2017 CLINICAL DATA:  RIGHT upper quadrant abdominal pain for 4 months, nausea, vomiting, worsened symptoms with food EXAM: NUCLEAR MEDICINE HEPATOBILIARY IMAGING WITH GALLBLADDER EF TECHNIQUE: Sequential images of the abdomen were obtained out to 60 minutes following intravenous administration of radiopharmaceutical. After oral ingestion of Ensure, gallbladder ejection fraction was determined. At 60 min, normal ejection fraction is greater than 33%. RADIOPHARMACEUTICALS:  5.5 mCi Tc-57m Choletec IV COMPARISON:  None FINDINGS: Normal tracer extraction from bloodstream indicating normal hepatocellular function. Normal excretion of tracer into biliary tree. Gallbladder visualized at 18 min. Small bowel visualized at 23 min. No hepatic retention of tracer. Suggests a week decrease emptying of tracer from gallbladder following fatty meal  stimulation. Calculated gallbladder ejection fraction is 15%, abnormally low. Patient reported  abdominal pain following Ensure ingestion. It should be noted that the patient received Dilaudid approximately 6 hours prior to imaging. Normal gallbladder ejection fraction following Ensure ingestion is greater than 33% at 1 hour. IMPRESSION: Patent biliary tree. Abnormal gallbladder response to fatty meal stimulation with a decreased gallbladder ejection fraction of 15% as discussed above. Electronically Signed   By: Lavonia Dana M.D.   On: 04/15/2017 15:13   US Abdomen Limited  Result Date: 04/10/2017 CLINICAL DATA:  Right upper quadrant pain with nausea and vomiting for 3 days. EXAM: ULTRASOUND ABDOMEN LIMITED RIGHT UPPER QUADRANT COMPARISON:  CT chest, abdomen and pelvis 09/09/2016. FINDINGS: Gallbladder: No gallstones or wall thickening visualized. No sonographic Murphy sign noted by sonographer. Common bile duct: Diameter: 0.3 cm. Liver: No focal lesion identified. Within normal limits in parenchymal echogenicity. IMPRESSION: Negative exam. Electronically Signed   By: Inge Rise M.D.   On: 04/10/2017 10:48   Labs: Basic Metabolic Panel:  Recent Labs Lab 04/26/17 0849 04/27/17 0554  NA 143 139  K 3.9 3.3*  CL 107 106  CO2 22 26  GLUCOSE 132* 106*  BUN 9 5*  CREATININE 0.87 0.73  CALCIUM 10.9* 9.1   Liver Function Tests:  Recent Labs Lab 04/26/17 0849 04/27/17 0554  AST 22 15  ALT 21 15  ALKPHOS 75 53  BILITOT 0.8 0.5  PROT 8.1 6.1*  ALBUMIN 4.4 3.4*    Recent Labs Lab 04/26/17 0849  LIPASE 25    CBC:  Recent Labs Lab 04/26/17 0849 04/27/17 0554  WBC 18.8* 9.2  NEUTROABS 14.6*  --   HGB 15.2* 12.3  HCT 44.1 36.2  MCV 91.1 92.8  PLT 358 235    Principal Problem:   Nausea and vomiting Active Problems:   Intractable abdominal pain   Leukocytosis   Cigarette smoker   Time coordinating discharge: 35 minutes  Signed:  Murray Hodgkins, MD [facility] 04/27/2017, 3:23 PM

## 2017-05-06 ENCOUNTER — Encounter: Payer: Self-pay | Admitting: General Surgery

## 2017-05-06 ENCOUNTER — Ambulatory Visit (INDEPENDENT_AMBULATORY_CARE_PROVIDER_SITE_OTHER): Payer: Self-pay | Admitting: General Surgery

## 2017-05-06 VITALS — BP 133/82 | HR 91 | Temp 97.3°F | Resp 18 | Ht 68.0 in | Wt 192.0 lb

## 2017-05-06 DIAGNOSIS — Z09 Encounter for follow-up examination after completed treatment for conditions other than malignant neoplasm: Secondary | ICD-10-CM

## 2017-05-06 NOTE — Progress Notes (Signed)
Subjective:     Debbie Santana  Status post laparoscopic cholecystectomy. Doing well. Has no significant incisional pain. Preoperative symptoms have resolved. Objective:    BP 133/82   Pulse 91   Temp (!) 97.3 F (36.3 C)   Resp 18   Ht 5\' 8"  (1.727 m)   Wt 192 lb (87.1 kg)   LMP 04/23/2017 Comment: neg hcg  BMI 29.19 kg/m   General:  alert, cooperative and no distress  Abdomen is soft, incisions healing well.     Assessment:    Doing well postoperatively.    Plan:   May return to work without restrictions on 05/15/2017. Follow-up here as needed.

## 2017-08-03 ENCOUNTER — Encounter (HOSPITAL_COMMUNITY): Payer: Self-pay

## 2017-08-03 ENCOUNTER — Emergency Department (HOSPITAL_COMMUNITY)
Admission: EM | Admit: 2017-08-03 | Discharge: 2017-08-03 | Disposition: A | Discharge status: Home / Self Care | Payer: Self-pay | Attending: Emergency Medicine | Admitting: Emergency Medicine

## 2017-08-03 ENCOUNTER — Other Ambulatory Visit: Payer: Self-pay

## 2017-08-03 DIAGNOSIS — M549 Dorsalgia, unspecified: Secondary | ICD-10-CM | POA: Insufficient documentation

## 2017-08-03 DIAGNOSIS — F17210 Nicotine dependence, cigarettes, uncomplicated: Secondary | ICD-10-CM | POA: Insufficient documentation

## 2017-08-03 DIAGNOSIS — F1210 Cannabis abuse, uncomplicated: Secondary | ICD-10-CM | POA: Insufficient documentation

## 2017-08-03 DIAGNOSIS — R300 Dysuria: Secondary | ICD-10-CM | POA: Insufficient documentation

## 2017-08-03 DIAGNOSIS — N939 Abnormal uterine and vaginal bleeding, unspecified: Secondary | ICD-10-CM | POA: Insufficient documentation

## 2017-08-03 LAB — URINALYSIS, ROUTINE W REFLEX MICROSCOPIC
Bacteria, UA: NONE SEEN
Bilirubin Urine: NEGATIVE
Glucose, UA: NEGATIVE mg/dL
Ketones, ur: NEGATIVE mg/dL
Leukocytes, UA: NEGATIVE
Nitrite: NEGATIVE
PROTEIN: NEGATIVE mg/dL
Specific Gravity, Urine: 1.009 (ref 1.005–1.030)
pH: 5 (ref 5.0–8.0)

## 2017-08-03 LAB — CBC WITH DIFFERENTIAL/PLATELET
BASOS PCT: 0 %
Basophils Absolute: 0 10*3/uL (ref 0.0–0.1)
EOS PCT: 1 %
Eosinophils Absolute: 0 10*3/uL (ref 0.0–0.7)
HEMATOCRIT: 41.3 % (ref 36.0–46.0)
Hemoglobin: 14 g/dL (ref 12.0–15.0)
Lymphocytes Relative: 38 %
Lymphs Abs: 1.9 10*3/uL (ref 0.7–4.0)
MCH: 31.7 pg (ref 26.0–34.0)
MCHC: 33.9 g/dL (ref 30.0–36.0)
MCV: 93.7 fL (ref 78.0–100.0)
MONO ABS: 0.4 10*3/uL (ref 0.1–1.0)
MONOS PCT: 9 %
NEUTROS ABS: 2.6 10*3/uL (ref 1.7–7.7)
Neutrophils Relative %: 52 %
PLATELETS: 198 10*3/uL (ref 150–400)
RBC: 4.41 MIL/uL (ref 3.87–5.11)
RDW: 12.6 % (ref 11.5–15.5)
WBC: 5 10*3/uL (ref 4.0–10.5)

## 2017-08-03 LAB — WET PREP, GENITAL
CLUE CELLS WET PREP: NONE SEEN
Sperm: NONE SEEN
TRICH WET PREP: NONE SEEN
Yeast Wet Prep HPF POC: NONE SEEN

## 2017-08-03 LAB — BASIC METABOLIC PANEL
Anion gap: 6 (ref 5–15)
BUN: 11 mg/dL (ref 6–20)
CALCIUM: 9.1 mg/dL (ref 8.9–10.3)
CO2: 26 mmol/L (ref 22–32)
Chloride: 105 mmol/L (ref 101–111)
Creatinine, Ser: 0.81 mg/dL (ref 0.44–1.00)
GFR calc non Af Amer: >60 mL/min (ref >60)
Glucose, Bld: 97 mg/dL (ref 65–99)
Potassium: 3.8 mmol/L (ref 3.5–5.1)
Sodium: 137 mmol/L (ref 135–145)

## 2017-08-03 LAB — POC URINE PREG, ED: Preg Test, Ur: NEGATIVE

## 2017-08-03 MED ORDER — NORGESTIMATE-ETH ESTRADIOL 0.25-35 MG-MCG PO TABS: 3.0000 | ORAL_TABLET | Freq: Every day | ORAL | 0 refills | Status: DC

## 2017-08-03 MED ORDER — HYDROCODONE-ACETAMINOPHEN 5-325 MG PO TABS
1.0000 | ORAL_TABLET | Freq: Once | ORAL | Status: AC
  Administered 2017-08-03: 1 via ORAL
  Filled 2017-08-03: qty 1

## 2017-08-03 MED ORDER — NORGESTIMATE-ETH ESTRADIOL 0.25-35 MG-MCG PO TABS: 1.0000 | ORAL_TABLET | Freq: Every day | ORAL | 11 refills | Status: DC

## 2017-08-03 NOTE — ED Notes (Signed)
Dr Jerilynn Mages in to discuss findings

## 2017-08-03 NOTE — Discharge Instructions (Signed)
You have received 2 prescriptions for a medication called Sprintec. This is an oral contraceptive. The first prescription is for one package. This one package he should take 3 pills a day for one week then throw the rest away. This should help with your uterine bleeding. It should cease during this time. After this week take one week off and during this time and will likely have a regular period. After the one week off without medication get the other prescription filled which is the same medication. Then start taking it once daily as instructed on the package. This may cause a little bit of nausea when you're taking 3 a day if so you may back down to twice a day. Please follow-up with her gynecologist in 1-2 weeks for further evaluation and management of your vaginal bleeding. °

## 2017-08-03 NOTE — ED Notes (Signed)
Pelvic setup ready in room

## 2017-08-03 NOTE — ED Triage Notes (Signed)
Patient reports of lower abdominal pain, back pain and bilateral leg pain x2-3 days. States she started heavy bleeding 2 days ago. LMP 07/20/17

## 2017-08-03 NOTE — ED Provider Notes (Signed)
Emergency Department Provider Note   I have reviewed the triage vital signs and the nursing notes.   HISTORY  Chief Complaint Abdominal Pain   HPI Debbie Santana is a 26 y.o. female with history of peptic ulcer disease and gastric ulcers and irregular periods presents the emergency department today secondary to vaginal bleeding.  Patient states her last mental cycle was on October 28 and last approximately 5 days and this was normal for her.  However over the last 2-3 days she has had progressively worsening lower abdominal discomfort with vaginal bleeding.  She states she uses 2-3 pads an hour and every once a while will feel little bit lightheaded but not consistently so.  She does not have any chest pain or syncope.  No shortness of breath or swelling or rashes.  She does state she have some back pain which seems consistent with her previous chronic back pain may be slightly worse.  But her suprapubic abdominal pain is significantly worse she is also had some dysuria.  States she has had some dysfunctional uterine bleeding in the past and seeing a pathology and found to have abnormal Pap smears for which she supposed to get every 29-month Pap smears however she missed her last one.  No history of cancer.  No other associated modifying symptoms.  Has not seen anyone else for the symptoms.  Is sexually active and not on birth control.   Past Medical History:  Diagnosis Date  . Bradycardia 04/17/2017  . Chronic back pain   . 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 Gastric ulcer   . Peptic ulcer disease 04/13/2017  . Sciatica     Patient Active Problem List   Diagnosis Date Noted  . Cigarette smoker 04/26/2017  . Reflux esophagitis 04/17/2017  . Obesity 04/17/2017  . Bradycardia 04/17/2017  . Tobacco abuse 04/17/2017  . Chronic cholecystitis   . Peptic ulcer disease 04/13/2017  . 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 Intractable abdominal pain 04/11/2017  . Leukocytosis 04/11/2017  .  Hyperglycemia 04/11/2017  . Anxiety 04/11/2017  . Nausea and vomiting   . Right hand fracture 10/06/2013  . Fracture of metacarpal shaft of right hand, closed 10/06/2013  . Spontaneous abortion in first trimester 04/21/2013    Past Surgical History:  Procedure Laterality Date  . BACK SURGERY    . HERNIA REPAIR      Current Outpatient Rx  . Order #: [id number] Class: Print  . Order #: [id number] Class: Print  . Order #: [id number] Class: Print    Allergies Patient has no known allergies.  Family History  Problem Relation Age of Onset  . Anxiety disorder Mother     Social History Social History   Tobacco Use  . Smoking status: Current Every Day Smoker    Packs/day: 1.00    Types: Cigarettes  . Smokeless tobacco: Never Used  . Tobacco comment: never done snuff or chewing tobacco.  Substance Use Topics  . Alcohol use: No    Comment: occasionally  . Drug use: Yes    Types: Marijuana    Comment: 04/11/17    Review of Systems  All other systems negative except as documented in the HPI. All pertinent positives and negatives as reviewed in the HPI. ____________________________________________   PHYSICAL EXAM:  VITAL SIGNS: ED Triage Vitals  Enc Vitals Group     BP 08/03/17 0905 125/77     Pulse Rate 08/03/17 0905 83     Resp 08/03/17 0905 20     Temp  08/03/17 0905 98.6 F (37 C)     Temp Source 08/03/17 0905 Oral     SpO2 08/03/17 0905 100 %     Weight 08/03/17 0904 195 lb (88.5 kg)     Height 08/03/17 0904 5\' 10"  (1.778 m)    Constitutional: Alert and oriented. Well appearing and in no acute distress. Eyes: Conjunctivae are normal. PERRL. EOMI. Head: Atraumatic. Nose: No congestion/rhinnorhea. Mouth/Throat: Mucous membranes are moist.  Oropharynx non-erythematous. Neck: No stridor.  No meningeal signs.   Cardiovascular: Normal rate, regular rhythm. Good peripheral circulation. Grossly normal heart sounds.   Respiratory: Normal respiratory effort.  No  retractions. Lungs CTAB. Gastrointestinal: Soft and nontender. No distention.  Musculoskeletal: No lower extremity tenderness nor edema. No gross deformities of extremities. Neurologic:  Normal speech and language. No gross focal neurologic deficits are appreciated.  Skin:  Skin is warm, dry and intact. No rash noted. GU: minimal bright red blood in vaginal vault. No obvious cervical growths. Os closed.   ____________________________________________   LABS (all labs ordered are listed, but only abnormal results are displayed)  Labs Reviewed  WET PREP, GENITAL - Abnormal; Notable for the following components:      Result Value   WBC, Wet Prep HPF POC MANY (*)    All other components within normal limits  URINALYSIS, ROUTINE W REFLEX MICROSCOPIC - Abnormal; Notable for the following components:   Color, Urine STRAW (*)    Hgb urine dipstick LARGE (*)    Squamous Epithelial / LPF 0-5 (*)    All other components within normal limits  CBC WITH DIFFERENTIAL/PLATELET  BASIC METABOLIC PANEL  POC URINE PREG, ED  GC/CHLAMYDIA PROBE AMP ([place]) NOT AT [facility]   ____________________________________________  RADIOLOGY  No results found.  ____________________________________________   PROCEDURES  Procedure(s) performed:   Procedures   ____________________________________________   INITIAL IMPRESSION / ASSESSMENT AND PLAN / ED COURSE  Pertinent labs & imaging results that were available during my care of the patient were reviewed by me and considered in my medical decision making (see chart for details).  With her reported level of bleeding and intermittent lightheadedness will check CBC, BMP, urinalysis, urine pregnancy, wet prep and GC chlamydia.  Will do pelvic exam to evaluate the briskness and source of bleeding prior to instituting therapies.  Hemoglobin normal.  Hemodynamically stable.  Plan for outpatient discharge on oral contraceptives and follow-up with  gynecology.   ____________________________________________  FINAL CLINICAL IMPRESSION(S) / ED DIAGNOSES  Final diagnoses:  Abnormal vaginal bleeding     MEDICATIONS GIVEN DURING THIS VISIT:  Medications  HYDROcodone-acetaminophen (NORCO/VICODIN) 5-325 MG per tablet 1 tablet (1 tablet Oral Given 08/03/17 1115)     NEW OUTPATIENT MEDICATIONS STARTED DURING THIS VISIT:  This SmartLink is deprecated. Use AVSMEDLIST instead to display the medication list for a patient.  Note:  This document was prepared using Dragon voice recognition software and may include unintentional dictation errors.   Merrily Pew, MD 08/03/17 [phone]

## 2017-08-03 NOTE — ED Notes (Signed)
Awaiting discharge

## 2017-08-04 LAB — GC/CHLAMYDIA PROBE AMP (~~LOC~~) NOT AT ARMC
CHLAMYDIA, DNA PROBE: NEGATIVE
Neisseria Gonorrhea: NEGATIVE

## 2017-09-23 NOTE — L&D Delivery Note (Addendum)
Patient: Debbie Santana MRN: [id number]  GBS status: Positive, IAP given--PCN   Patient is a 27 y.o. now G2P1011 s/p NSVD at [redacted]w[redacted]d, who was admitted for PROM. SROM 17h 15m prior to delivery with clear fluid.    Delivery Note At 7:29 PM a viable female was delivered via Vaginal, Spontaneous (Presentation: ROA).  APGAR: 8,9; weight pending.   Placenta status: spontaneous, intact.  Cord: 3 vessel with the following complications:  Anesthesia:  Epidural  Episiotomy: None Lacerations:  Right periurethral, left sulcus  Suture Repair: hemostatic  Est. Blood Loss (mL):  100   Head delivered ROA. No nuchal cord present. Shoulder and body delivered in usual fashion. Infant with spontaneous cry, placed on mother's abdomen, dried and bulb suctioned. Cord clamped x 2 after 1-minute delay, and cut by family member. Cord blood drawn. Placenta delivered spontaneously with gentle cord traction. Fundus firm with massage and Pitocin. Right periurethral and left superficial sulcus tear present, both hemostatic.   Mom to postpartum.  Baby to Couplet care / Skin to Skin.  Melina Schools 05/15/2018, 7:44 PM

## 2017-10-06 ENCOUNTER — Encounter (HOSPITAL_COMMUNITY): Payer: Self-pay | Admitting: Emergency Medicine

## 2017-10-06 ENCOUNTER — Other Ambulatory Visit: Payer: Self-pay

## 2017-10-06 ENCOUNTER — Emergency Department (HOSPITAL_COMMUNITY)
Admission: EM | Admit: 2017-10-06 | Discharge: 2017-10-06 | Disposition: A | Discharge status: Home / Self Care | Payer: 59 | Attending: Emergency Medicine | Admitting: Emergency Medicine

## 2017-10-06 DIAGNOSIS — O209 Hemorrhage in early pregnancy, unspecified: Secondary | ICD-10-CM | POA: Diagnosis present

## 2017-10-06 DIAGNOSIS — Z3A01 Less than 8 weeks gestation of pregnancy: Secondary | ICD-10-CM | POA: Diagnosis not present

## 2017-10-06 DIAGNOSIS — O99331 Smoking (tobacco) complicating pregnancy, first trimester: Secondary | ICD-10-CM | POA: Insufficient documentation

## 2017-10-06 DIAGNOSIS — F17210 Nicotine dependence, cigarettes, uncomplicated: Secondary | ICD-10-CM | POA: Insufficient documentation

## 2017-10-06 DIAGNOSIS — O200 Threatened abortion: Secondary | ICD-10-CM | POA: Insufficient documentation

## 2017-10-06 DIAGNOSIS — Z79899 Other long term (current) drug therapy: Secondary | ICD-10-CM | POA: Insufficient documentation

## 2017-10-06 DIAGNOSIS — N939 Abnormal uterine and vaginal bleeding, unspecified: Secondary | ICD-10-CM | POA: Diagnosis not present

## 2017-10-06 LAB — CBC WITH DIFFERENTIAL/PLATELET
BASOS PCT: 0 %
Basophils Absolute: 0 10*3/uL (ref 0.0–0.1)
Eosinophils Absolute: 0.1 10*3/uL (ref 0.0–0.7)
Eosinophils Relative: 1 %
HEMATOCRIT: 44.1 % (ref 36.0–46.0)
Hemoglobin: 15.1 g/dL — ABNORMAL HIGH (ref 12.0–15.0)
Lymphocytes Relative: 26 %
Lymphs Abs: 3.7 10*3/uL (ref 0.7–4.0)
MCH: 31.3 pg (ref 26.0–34.0)
MCHC: 34.2 g/dL (ref 30.0–36.0)
MCV: 91.3 fL (ref 78.0–100.0)
MONO ABS: 0.9 10*3/uL (ref 0.1–1.0)
MONOS PCT: 6 %
NEUTROS ABS: 9.7 10*3/uL — AB (ref 1.7–7.7)
Neutrophils Relative %: 67 %
Platelets: 274 10*3/uL (ref 150–400)
RBC: 4.83 MIL/uL (ref 3.87–5.11)
RDW: 12.8 % (ref 11.5–15.5)
WBC: 14.3 10*3/uL — ABNORMAL HIGH (ref 4.0–10.5)

## 2017-10-06 LAB — BASIC METABOLIC PANEL
Anion gap: 9 (ref 5–15)
BUN: 9 mg/dL (ref 6–20)
CALCIUM: 9.8 mg/dL (ref 8.9–10.3)
CO2: 23 mmol/L (ref 22–32)
CREATININE: 0.74 mg/dL (ref 0.44–1.00)
Chloride: 103 mmol/L (ref 101–111)
GFR calc non Af Amer: >60 mL/min (ref >60)
Glucose, Bld: 89 mg/dL (ref 65–99)
Potassium: 3.8 mmol/L (ref 3.5–5.1)
Sodium: 135 mmol/L (ref 135–145)

## 2017-10-06 LAB — URINALYSIS, ROUTINE W REFLEX MICROSCOPIC
BACTERIA UA: NONE SEEN
Bilirubin Urine: NEGATIVE
Glucose, UA: NEGATIVE mg/dL
Ketones, ur: NEGATIVE mg/dL
Leukocytes, UA: NEGATIVE
NITRITE: NEGATIVE
Protein, ur: NEGATIVE mg/dL
SPECIFIC GRAVITY, URINE: 1.011 (ref 1.005–1.030)
pH: 5 (ref 5.0–8.0)

## 2017-10-06 LAB — HCG, QUANTITATIVE, PREGNANCY: hCG, Beta Chain, Quant, S: 18640 m[IU]/mL — ABNORMAL HIGH (ref <5)

## 2017-10-06 MED ORDER — PROMETHAZINE HCL 25 MG PO TABS: 25.0000 mg | ORAL_TABLET | Freq: Four times a day (QID) | ORAL | 0 refills | Status: DC | PRN

## 2017-10-06 MED ORDER — PROMETHAZINE HCL 25 MG/ML IJ SOLN
25.0000 mg | Freq: Once | INTRAMUSCULAR | Status: AC
  Administered 2017-10-06: 25 mg via INTRAMUSCULAR
  Filled 2017-10-06: qty 1

## 2017-10-06 NOTE — ED Provider Notes (Signed)
[facility] EMERGENCY DEPARTMENT Provider Note   CSN: [id number] Arrival date & time: 10/06/17  1352     History   Chief Complaint Chief Complaint  Patient presents with  . Vaginal Bleeding    HPI Debbie Santana is a 27 y.o. female.  Complaint is pregnant, vaginal bleeding.  HPI 27 year old female.  G2, P1, currently at 5 weeks with LMP of December 5.  She and her husband had intercourse last night.  Had some bleeding afterwards.  Less so today.  A little bit of cramping last night but none today.  No pain.  Not lightheaded or syncopal.  She became concerned because her first pregnancy ended in a 9-week SAB.  Past Medical History:  Diagnosis Date  . Bradycardia 04/17/2017  . Chronic back pain   . 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 Gastric ulcer   . Peptic ulcer disease 04/13/2017  . Sciatica     Patient Active Problem List   Diagnosis Date Noted  . Cigarette smoker 04/26/2017  . Reflux esophagitis 04/17/2017  . Obesity 04/17/2017  . Bradycardia 04/17/2017  . Tobacco abuse 04/17/2017  . Chronic cholecystitis   . Peptic ulcer disease 04/13/2017  . 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 Intractable abdominal pain 04/11/2017  . Leukocytosis 04/11/2017  . Hyperglycemia 04/11/2017  . Anxiety 04/11/2017  . Nausea and vomiting   . Right hand fracture 10/06/2013  . Fracture of metacarpal shaft of right hand, closed 10/06/2013  . Spontaneous abortion in first trimester 04/21/2013    Past Surgical History:  Procedure Laterality Date  . BACK SURGERY    . BIOPSY  04/14/2017   Procedure: BIOPSY;  Surgeon: Rogene Houston, MD;  Location: [facility];  Service: Endoscopy;;  gastric  . CHOLECYSTECTOMY N/A 04/17/2017   Procedure: LAPAROSCOPIC CHOLECYSTECTOMY;  Surgeon: Aviva Signs, MD;  Location: [facility] ORS;  Service: General;  Laterality: N/A;  . ESOPHAGOGASTRODUODENOSCOPY (EGD) WITH PROPOFOL N/A 04/14/2017   Procedure: ESOPHAGOGASTRODUODENOSCOPY (EGD) WITH PROPOFOL;  Surgeon: Rogene Houston, MD;  Location: [facility];  Service: Endoscopy;  Laterality: N/A;  . HERNIA REPAIR      OB History    Gravida Para Term Preterm AB Living   1       1     SAB TAB Ectopic Multiple Live Births   1               Home Medications    Prior to Admission medications   Medication Sig Start Date End Date Taking? Authorizing Provider  norgestimate-ethinyl estradiol (SPRINTEC 28) 0.25-35 MG-MCG tablet Take 3 tablets daily for 7 days by mouth. 08/03/17 08/10/17  Mesner, Corene Cornea, MD  norgestimate-ethinyl estradiol ([facility] 28) 0.25-35 MG-MCG tablet Take 1 tablet daily by mouth. 08/03/17   Mesner, Corene Cornea, MD  omeprazole (PRILOSEC) 20 MG capsule Take 1 capsule (20 mg total) by mouth 2 (two) times daily before a meal. 04/18/17   Aviva Signs, MD  promethazine (PHENERGAN) 25 MG tablet Take 1 tablet (25 mg total) by mouth every 6 (six) hours as needed for nausea or vomiting. 10/06/17   Tanna Furry, MD    Family History Family History  Problem Relation Age of Onset  . Anxiety disorder Mother     Social History Social History   Tobacco Use  . Smoking status: Current Every Day Smoker    Packs/day: 1.00    Types: Cigarettes  . Smokeless tobacco: Never Used  . Tobacco comment: never done snuff or chewing tobacco.  Substance Use  Topics  . Alcohol use: No    Comment: occasionally  . Drug use: Yes    Types: Marijuana    Comment: 04/11/17     Allergies   Patient has no known allergies.   Review of Systems Review of Systems  Constitutional: Negative for appetite change, chills, diaphoresis, fatigue and fever.  HENT: Negative for mouth sores, sore throat and trouble swallowing.   Eyes: Negative for visual disturbance.  Respiratory: Negative for cough, chest tightness, shortness of breath and wheezing.   Cardiovascular: Negative for chest pain.  Gastrointestinal: Negative for abdominal distention, abdominal pain, diarrhea, nausea and vomiting.  Endocrine: Negative for  polydipsia, polyphagia and polyuria.  Genitourinary: Positive for vaginal bleeding. Negative for dysuria, frequency and hematuria.  Musculoskeletal: Negative for gait problem.  Skin: Negative for color change, pallor and rash.  Neurological: Negative for dizziness, syncope, light-headedness and headaches.  Hematological: Does not bruise/bleed easily.  Psychiatric/Behavioral: Negative for behavioral problems and confusion.     Physical Exam Updated Vital Signs BP 133/88 (BP Location: Right Arm)   Pulse 83   Temp 98.8 F (37.1 C) (Oral)   Resp 16   Ht 5\' 11"  (1.803 m)   Wt 90.7 kg (200 lb)   LMP 08/28/2017   SpO2 99%   BMI 27.89 kg/m   Physical Exam  Constitutional: She is oriented to person, place, and time. She appears well-developed and well-nourished. No distress.  HENT:  Head: Normocephalic.  Eyes: Conjunctivae are normal. Pupils are equal, round, and reactive to light. No scleral icterus.  Neck: Normal range of motion. Neck supple. No thyromegaly present.  Cardiovascular: Normal rate and regular rhythm. Exam reveals no gallop and no friction rub.  No murmur heard. Pulmonary/Chest: Effort normal and breath sounds normal. No respiratory distress. She has no wheezes. She has no rales.  Abdominal: Soft. Bowel sounds are normal. She exhibits no distension. There is no tenderness. There is no rebound.  Genitourinary:  Genitourinary Comments: Pelvic exam shows dilated vessels on the cervix.  Likely her source of bleeding.  Her os is closed.  Musculoskeletal: Normal range of motion.  Neurological: She is alert and oriented to person, place, and time.  Skin: Skin is warm and dry. No rash noted.  Psychiatric: She has a normal mood and affect. Her behavior is normal.     ED Treatments / Results  Labs (all labs ordered are listed, but only abnormal results are displayed) Labs Reviewed  HCG, QUANTITATIVE, PREGNANCY - Abnormal; Notable for the following components:      Result  Value   hCG, Beta Chain, Quant, S 18,640 (*)    All other components within normal limits  CBC WITH DIFFERENTIAL/PLATELET - Abnormal; Notable for the following components:   WBC 14.3 (*)    Hemoglobin 15.1 (*)    Neutro Abs 9.7 (*)    All other components within normal limits  URINALYSIS, ROUTINE W REFLEX MICROSCOPIC - Abnormal; Notable for the following components:   Hgb urine dipstick MODERATE (*)    Squamous Epithelial / LPF 0-5 (*)    All other components within normal limits  BASIC METABOLIC PANEL    EKG  EKG Interpretation None       Radiology No results found.  Procedures Procedures (including critical care time)  Medications Ordered in ED Medications  promethazine (PHENERGAN) injection 25 mg (not administered)     Initial Impression / Assessment and Plan / ED Course  I have reviewed the triage vital signs and the nursing  notes.  Pertinent labs & imaging results that were available during my care of the patient were reviewed by me and considered in my medical decision making (see chart for details).     Quantitative hCG is 18,000.  Bedside ultrasound shows enlargement of uterus and intrauterine process.  I cannot identify fetal heart tone.  Had arranged for a.m. OB ultrasound.  I think her bleeding is likely secondary to her dilated vessels on her cervix.  Instructed if she has any pain, lightheadedness, or worsening symptoms she will return tonight.  Otherwise a.m. ultrasound and OB/GYN follow-up.  Complaint of migraine headache.  She did have quite a prolonged wait in the waiting room and states she is "stressed out".  During typically works for her.  Was given dose of Phenergan.  Prescription for the same.  GYN follow-up.  Final Clinical Impressions(s) / ED Diagnoses   Final diagnoses:  Vaginal bleeding  Threatened miscarriage    ED Discharge Orders        Ordered    [place] OP OB Comp Less 14 Wks     10/06/17 1953    promethazine (PHENERGAN) 25 MG  tablet  Every 6 hours PRN     10/06/17 1958       Tanna Furry, MD 10/06/17 2006

## 2017-10-06 NOTE — ED Triage Notes (Signed)
Pt had home pos preg test Thursday, started spotting and having lower back pain last night. abd cramping. Nad.

## 2017-10-06 NOTE — Discharge Instructions (Signed)
Return tomorrow at the designated tim e for your ultrasound. Return here with any pain, heavy bleeding, or other symptoms.

## 2017-10-08 ENCOUNTER — Telehealth: Payer: Self-pay | Admitting: Obstetrics and Gynecology

## 2017-10-08 NOTE — Telephone Encounter (Signed)
Chart reviewed after last ED visit 10/06/17. Left message on pt personal phone that we would be available for f/u if desired at [phone].

## 2017-10-20 ENCOUNTER — Other Ambulatory Visit: Payer: Self-pay | Admitting: Obstetrics and Gynecology

## 2017-10-20 DIAGNOSIS — O3680X0 Pregnancy with inconclusive fetal viability, not applicable or unspecified: Secondary | ICD-10-CM

## 2017-10-21 ENCOUNTER — Ambulatory Visit (INDEPENDENT_AMBULATORY_CARE_PROVIDER_SITE_OTHER): Payer: 59

## 2017-10-21 DIAGNOSIS — O3680X0 Pregnancy with inconclusive fetal viability, not applicable or unspecified: Secondary | ICD-10-CM

## 2017-10-21 DIAGNOSIS — N8311 Corpus luteum cyst of right ovary: Secondary | ICD-10-CM | POA: Diagnosis not present

## 2017-10-21 DIAGNOSIS — Z3A01 Less than 8 weeks gestation of pregnancy: Secondary | ICD-10-CM | POA: Diagnosis not present

## 2017-10-21 DIAGNOSIS — O3481 Maternal care for other abnormalities of pelvic organs, first trimester: Secondary | ICD-10-CM

## 2017-10-21 NOTE — Progress Notes (Signed)
[place] 7+5 wks,single IUP w/ys,positive fht 141 bpm, right simple corpus luteal cyst 3.3 x 2.2 x 1.8 cm,left ovary not visualized,crl 10.01 mm,EDD 06/04/2018 BY LMP

## 2017-11-04 ENCOUNTER — Encounter: Payer: Self-pay | Admitting: Advanced Practice Midwife

## 2017-11-04 ENCOUNTER — Ambulatory Visit (INDEPENDENT_AMBULATORY_CARE_PROVIDER_SITE_OTHER): Payer: 59 | Admitting: Advanced Practice Midwife

## 2017-11-04 ENCOUNTER — Ambulatory Visit: Payer: 59 | Admitting: *Deleted

## 2017-11-04 VITALS — BP 120/60 | HR 110 | Wt 203.2 lb

## 2017-11-04 DIAGNOSIS — Z331 Pregnant state, incidental: Secondary | ICD-10-CM

## 2017-11-04 DIAGNOSIS — R87619 Unspecified abnormal cytological findings in specimens from cervix uteri: Secondary | ICD-10-CM | POA: Insufficient documentation

## 2017-11-04 DIAGNOSIS — Z23 Encounter for immunization: Secondary | ICD-10-CM

## 2017-11-04 DIAGNOSIS — O09291 Supervision of pregnancy with other poor reproductive or obstetric history, first trimester: Secondary | ICD-10-CM

## 2017-11-04 DIAGNOSIS — O99611 Diseases of the digestive system complicating pregnancy, first trimester: Secondary | ICD-10-CM

## 2017-11-04 DIAGNOSIS — Z3481 Encounter for supervision of other normal pregnancy, first trimester: Secondary | ICD-10-CM

## 2017-11-04 DIAGNOSIS — F17210 Nicotine dependence, cigarettes, uncomplicated: Secondary | ICD-10-CM | POA: Diagnosis not present

## 2017-11-04 DIAGNOSIS — Z3490 Encounter for supervision of normal pregnancy, unspecified, unspecified trimester: Secondary | ICD-10-CM | POA: Insufficient documentation

## 2017-11-04 DIAGNOSIS — Z1389 Encounter for screening for other disorder: Secondary | ICD-10-CM

## 2017-11-04 DIAGNOSIS — Z8742 Personal history of other diseases of the female genital tract: Secondary | ICD-10-CM

## 2017-11-04 DIAGNOSIS — O99321 Drug use complicating pregnancy, first trimester: Secondary | ICD-10-CM

## 2017-11-04 DIAGNOSIS — K811 Chronic cholecystitis: Secondary | ICD-10-CM

## 2017-11-04 DIAGNOSIS — Z3A09 9 weeks gestation of pregnancy: Secondary | ICD-10-CM

## 2017-11-04 LAB — POCT URINALYSIS DIPSTICK
Glucose, UA: NEGATIVE
KETONES UA: NEGATIVE
Leukocytes, UA: NEGATIVE
NITRITE UA: NEGATIVE
PROTEIN UA: NEGATIVE
RBC UA: NEGATIVE

## 2017-11-04 NOTE — Progress Notes (Addendum)
Subjective:    Debbie Santana is a G2P0010 110w5d being seen today for her first obstetrical visit.  Her obstetrical history is significant for early SAB.  Pregnancy history fully reviewed. Smoking cessation strategies discussed and encouraged  Patient reports no complaints and only a little nausea.  Vitals:   11/04/17 1033  BP: 120/60  Pulse: (!) 110  Weight: 203 lb 3.2 oz (92.2 kg)    HISTORY: OB History  Gravida Para Term Preterm AB Living  2       1    SAB TAB Ectopic Multiple Live Births  1            # Outcome Date GA Lbr Len/2nd Weight Sex Delivery Anes PTL Lv  2 Current           1 SAB 2014             Past Medical History:  Diagnosis Date  . Bradycardia 04/17/2017  . Chronic back pain   . 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 Gastric ulcer   . Peptic ulcer disease 04/13/2017  . Sciatica   . Vaginal Pap smear, abnormal    Past Surgical History:  Procedure Laterality Date  . BACK SURGERY    . BIOPSY  04/14/2017   Procedure: BIOPSY;  Surgeon: Rogene Houston, MD;  Location: [facility];  Service: Endoscopy;;  gastric  . CHOLECYSTECTOMY N/A 04/17/2017   Procedure: LAPAROSCOPIC CHOLECYSTECTOMY;  Surgeon: Aviva Signs, MD;  Location: [facility] ORS;  Service: General;  Laterality: N/A;  . ESOPHAGOGASTRODUODENOSCOPY (EGD) WITH PROPOFOL N/A 04/14/2017   Procedure: ESOPHAGOGASTRODUODENOSCOPY (EGD) WITH PROPOFOL;  Surgeon: Rogene Houston, MD;  Location: [facility];  Service: Endoscopy;  Laterality: N/A;  . HERNIA REPAIR     Family History  Problem Relation Age of Onset  . Anxiety disorder Mother   . Hyperlipidemia Mother   . Diabetes Maternal Grandfather   . Diabetes Cousin   . Learning disabilities Cousin      Exam                                      System:     Skin: normal coloration and turgor, no rashes    Neurologic: oriented, normal, normal mood   Extremities: normal strength, tone, and muscle mass   HEENT PERRLA   Mouth/Teeth mucous membranes  moist, normal dentition   Neck supple and no masses   Cardiovascular: regular rate and rhythm   Respiratory:  appears well, vitals normal, no respiratory distress, acyanotic   Abdomen: soft, non-tender;  FHR: 160 [place]        The nature of [place] with multiple MDs and other Advanced Practice Providers was explained to patient; also emphasized that residents, students are part of our team.  Assessment:    Pregnancy: G2P0010 Patient Active Problem List   Diagnosis Date Noted  . Supervision of normal pregnancy 11/04/2017  . History of abnormal cervical Pap smear 11/04/2017  . Cigarette smoker 04/26/2017  . Reflux esophagitis 04/17/2017  . Obesity 04/17/2017  . Bradycardia 04/17/2017  . Tobacco abuse 04/17/2017  . Chronic cholecystitis   . Peptic ulcer disease 04/13/2017  . 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 Intractable abdominal pain 04/11/2017  . Leukocytosis 04/11/2017  . Hyperglycemia 04/11/2017  . Anxiety 04/11/2017        Plan:     Initial labs drawn.  Continue prenatal vitamins  Problem list reviewed and updated  Reviewed n/v relief measures and warning s/s to report  Reviewed recommended weight gain based on pre-gravid BMI  Encouraged well-balanced diet Genetic Screening discussed Integrated Screen: declined.  Ultrasound discussed; fetal survey: requested.  Return in about 4 weeks (around 12/02/2017) for [place] and colpo; .  Christin Fudge 11/04/2017

## 2017-11-04 NOTE — Patient Instructions (Signed)
 First Trimester of Pregnancy The first trimester of pregnancy is from week 1 until the end of week 12 (months 1 through 3). A week after a sperm fertilizes an egg, the egg will implant on the wall of the uterus. This embryo will begin to develop into a baby. Genes from you and your partner are forming the baby. The female genes determine whether the baby is a boy or a girl. At 6-8 weeks, the eyes and face are formed, and the heartbeat can be seen on ultrasound. At the end of 12 weeks, all the baby's organs are formed.  Now that you are pregnant, you will want to do everything you can to have a healthy baby. Two of the most important things are to get good prenatal care and to follow your health care provider's instructions. Prenatal care is all the medical care you receive before the baby's birth. This care will help prevent, find, and treat any problems during the pregnancy and childbirth. BODY CHANGES Your body goes through many changes during pregnancy. The changes vary from woman to woman.   You may gain or lose a couple of pounds at first.  You may feel sick to your stomach (nauseous) and throw up (vomit). If the vomiting is uncontrollable, call your health care provider.  You may tire easily.  You may develop headaches that can be relieved by medicines approved by your health care provider.  You may urinate more often. Painful urination may mean you have a bladder infection.  You may develop heartburn as a result of your pregnancy.  You may develop constipation because certain hormones are causing the muscles that push waste through your intestines to slow down.  You may develop hemorrhoids or swollen, bulging veins (varicose veins).  Your breasts may begin to grow larger and become tender. Your nipples may stick out more, and the tissue that surrounds them (areola) may become darker.  Your gums may bleed and may be sensitive to brushing and flossing.  Dark spots or blotches  (chloasma, mask of pregnancy) may develop on your face. This will likely fade after the baby is born.  Your menstrual periods will stop.  You may have a loss of appetite.  You may develop cravings for certain kinds of food.  You may have changes in your emotions from day to day, such as being excited to be pregnant or being concerned that something may go wrong with the pregnancy and baby.  You may have more vivid and strange dreams.  You may have changes in your hair. These can include thickening of your hair, rapid growth, and changes in texture. Some women also have hair loss during or after pregnancy, or hair that feels dry or thin. Your hair will most likely return to normal after your baby is born. WHAT TO EXPECT AT YOUR PRENATAL VISITS During a routine prenatal visit:  You will be weighed to make sure you and the baby are growing normally.  Your blood pressure will be taken.  Your abdomen will be measured to track your baby's growth.  The fetal heartbeat will be listened to starting around week 10 or 12 of your pregnancy.  Test results from any previous visits will be discussed. Your health care provider may ask you:  How you are feeling.  If you are feeling the baby move.  If you have had any abnormal symptoms, such as leaking fluid, bleeding, severe headaches, or abdominal cramping.  If you have any questions. Other   tests that may be performed during your first trimester include:  Blood tests to find your blood type and to check for the presence of any previous infections. They will also be used to check for low iron levels (anemia) and Rh antibodies. Later in the pregnancy, blood tests for diabetes will be done along with other tests if problems develop.  Urine tests to check for infections, diabetes, or protein in the urine.  An ultrasound to confirm the proper growth and development of the baby.  An amniocentesis to check for possible genetic problems.  Fetal  screens for spina bifida and Down syndrome.  You may need other tests to make sure you and the baby are doing well. HOME CARE INSTRUCTIONS  Medicines  Follow your health care provider's instructions regarding medicine use. Specific medicines may be either safe or unsafe to take during pregnancy.  Take your prenatal vitamins as directed.  If you develop constipation, try taking a stool softener if your health care provider approves. Diet  Eat regular, well-balanced meals. Choose a variety of foods, such as meat or vegetable-based protein, fish, milk and low-fat dairy products, vegetables, fruits, and whole grain breads and cereals. Your health care provider will help you determine the amount of weight gain that is right for you.  Avoid raw meat and uncooked cheese. These carry germs that can cause birth defects in the baby.  Eating four or five small meals rather than three large meals a day may help relieve nausea and vomiting. If you start to feel nauseous, eating a few soda crackers can be helpful. Drinking liquids between meals instead of during meals also seems to help nausea and vomiting.  If you develop constipation, eat more high-fiber foods, such as fresh vegetables or fruit and whole grains. Drink enough fluids to keep your urine clear or pale yellow. Activity and Exercise  Exercise only as directed by your health care provider. Exercising will help you:  Control your weight.  Stay in shape.  Be prepared for labor and delivery.  Experiencing pain or cramping in the lower abdomen or low back is a good sign that you should stop exercising. Check with your health care provider before continuing normal exercises.  Try to avoid standing for long periods of time. Move your legs often if you must stand in one place for a long time.  Avoid heavy lifting.  Wear low-heeled shoes, and practice good posture.  You may continue to have sex unless your health care provider directs you  otherwise. Relief of Pain or Discomfort  Wear a good support bra for breast tenderness.   Take warm sitz baths to soothe any pain or discomfort caused by hemorrhoids. Use hemorrhoid cream if your health care provider approves.   Rest with your legs elevated if you have leg cramps or low back pain.  If you develop varicose veins in your legs, wear support hose. Elevate your feet for 15 minutes, 3-4 times a day. Limit salt in your diet. Prenatal Care  Schedule your prenatal visits by the twelfth week of pregnancy. They are usually scheduled monthly at first, then more often in the last 2 months before delivery.  Write down your questions. Take them to your prenatal visits.  Keep all your prenatal visits as directed by your health care provider. Safety  Wear your seat belt at all times when driving.  Make a list of emergency phone numbers, including numbers for family, friends, the hospital, and police and fire departments. General   Tips  Ask your health care provider for a referral to a local prenatal education class. Begin classes no later than at the beginning of month 6 of your pregnancy.  Ask for help if you have counseling or nutritional needs during pregnancy. Your health care provider can offer advice or refer you to specialists for help with various needs.  Do not use hot tubs, steam rooms, or saunas.  Do not douche or use tampons or scented sanitary pads.  Do not cross your legs for long periods of time.  Avoid cat litter boxes and soil used by cats. These carry germs that can cause birth defects in the baby and possibly loss of the fetus by miscarriage or stillbirth.  Avoid all smoking, herbs, alcohol, and medicines not prescribed by your health care provider. Chemicals in these affect the formation and growth of the baby.  Schedule a dentist appointment. At home, brush your teeth with a soft toothbrush and be gentle when you floss. SEEK MEDICAL CARE IF:   You have  dizziness.  You have mild pelvic cramps, pelvic pressure, or nagging pain in the abdominal area.  You have persistent nausea, vomiting, or diarrhea.  You have a bad smelling vaginal discharge.  You have pain with urination.  You notice increased swelling in your face, hands, legs, or ankles. SEEK IMMEDIATE MEDICAL CARE IF:   You have a fever.  You are leaking fluid from your vagina.  You have spotting or bleeding from your vagina.  You have severe abdominal cramping or pain.  You have rapid weight gain or loss.  You vomit blood or material that looks like coffee grounds.  You are exposed to German measles and have never had them.  You are exposed to fifth disease or chickenpox.  You develop a severe headache.  You have shortness of breath.  You have any kind of trauma, such as from a fall or a car accident. Document Released: 09/03/2001 Document Revised: 01/24/2014 Document Reviewed: 07/20/2013 [employer] Patient Information 2015 [employer]. This information is not intended to replace advice given to you by your health care provider. Make sure you discuss any questions you have with your health care provider.   Nausea & Vomiting  Have saltine crackers or pretzels by your bed and eat a few bites before you raise your head out of bed in the morning  Eat small frequent meals throughout the day instead of large meals  Drink plenty of fluids throughout the day to stay hydrated, just don't drink a lot of fluids with your meals.  This can make your stomach fill up faster making you feel sick  Do not brush your teeth right after you eat  Products with real ginger are good for nausea, like ginger ale and ginger hard candy Make sure it says made with real ginger!  Sucking on sour candy like lemon heads is also good for nausea  If your prenatal vitamins make you nauseated, take them at night so you will sleep through the nausea  Sea Bands  If you feel like you need  medicine for the nausea & vomiting please let us know  If you are unable to keep any fluids or food down please let us know   Constipation  Drink plenty of fluid, preferably water, throughout the day  Eat foods high in fiber such as fruits, vegetables, and grains  Exercise, such as walking, is a good way to keep your bowels regular  Drink warm fluids, especially warm   prune juice, or decaf coffee  Eat a 1/2 cup of real oatmeal (not instant), 1/2 cup applesauce, and 1/2-1 cup warm prune juice every day  If needed, you may take Colace (docusate sodium) stool softener once or twice a day to help keep the stool soft. If you are pregnant, wait until you are out of your first trimester (12-14 weeks of pregnancy)  If you still are having problems with constipation, you may take Miralax once daily as needed to help keep your bowels regular.  If you are pregnant, wait until you are out of your first trimester (12-14 weeks of pregnancy)  Safe Medications in Pregnancy   Acne: Benzoyl Peroxide Salicylic Acid  Backache/Headache: Tylenol: 2 regular strength every 4 hours OR              2 Extra strength every 6 hours  Colds/Coughs/Allergies: Benadryl (alcohol free) 25 mg every 6 hours as needed Breath right strips Claritin Cepacol throat lozenges Chloraseptic throat spray Cold-Eeze- up to three times per day Cough drops, alcohol free Flonase (by prescription only) Guaifenesin Mucinex Robitussin DM (plain only, alcohol free) Saline nasal spray/drops Sudafed (pseudoephedrine) & Actifed ** use only after [redacted] weeks gestation and if you do not have high blood pressure Tylenol Vicks Vaporub Zinc lozenges Zyrtec   Constipation: Colace Ducolax suppositories Fleet enema Glycerin suppositories Metamucil Milk of magnesia Miralax Senokot Smooth move tea  Diarrhea: Kaopectate Imodium A-D  *NO pepto Bismol  Hemorrhoids: Anusol Anusol HC Preparation  H Tucks  Indigestion: Tums Maalox Mylanta Zantac  Pepcid  Insomnia: Benadryl (alcohol free) 25mg every 6 hours as needed Tylenol PM Unisom, no Gelcaps  Leg Cramps: Tums MagGel  Nausea/Vomiting:  Bonine Dramamine Emetrol Ginger extract Sea bands Meclizine  Nausea medication to take during pregnancy:  Unisom (doxylamine succinate 25 mg tablets) Take one tablet daily at bedtime. If symptoms are not adequately controlled, the dose can be increased to a maximum recommended dose of two tablets daily (1/2 tablet in the morning, 1/2 tablet mid-afternoon and one at bedtime). Vitamin B6 100mg tablets. Take one tablet twice a day (up to 200 mg per day).  Skin Rashes: Aveeno products Benadryl cream or 25mg every 6 hours as needed Calamine Lotion 1% cortisone cream  Yeast infection: Gyne-lotrimin 7 Monistat 7   **If taking multiple medications, please check labels to avoid duplicating the same active ingredients **take medication as directed on the label ** Do not exceed 4000 mg of tylenol in 24 hours **Do not take medications that contain aspirin or ibuprofen      

## 2017-11-05 LAB — PMP SCREEN PROFILE (10S), URINE
Amphetamine Scrn, Ur: NEGATIVE ng/mL
BARBITURATE SCREEN URINE: NEGATIVE ng/mL
BENZODIAZEPINE SCREEN, URINE: NEGATIVE ng/mL
CANNABINOIDS UR QL SCN: POSITIVE ng/mL — AB
COCAINE(METAB.)SCREEN, URINE: NEGATIVE ng/mL
Creatinine(Crt), U: 257.8 mg/dL (ref 20.0–300.0)
METHADONE SCREEN, URINE: NEGATIVE ng/mL
OXYCODONE+OXYMORPHONE UR QL SCN: NEGATIVE ng/mL
Opiate Scrn, Ur: NEGATIVE ng/mL
PROPOXYPHENE SCREEN URINE: NEGATIVE ng/mL
Ph of Urine: 6.1 (ref 4.5–8.9)
Phencyclidine Qn, Ur: NEGATIVE ng/mL

## 2017-11-05 LAB — CBC
Hematocrit: 43.1 % (ref 34.0–46.6)
Hemoglobin: 15 g/dL (ref 11.1–15.9)
MCH: 32.3 pg (ref 26.6–33.0)
MCHC: 34.8 g/dL (ref 31.5–35.7)
MCV: 93 fL (ref 79–97)
PLATELETS: 286 10*3/uL (ref 150–379)
RBC: 4.64 x10E6/uL (ref 3.77–5.28)
RDW: 13.6 % (ref 12.3–15.4)
WBC: 12.9 10*3/uL — ABNORMAL HIGH (ref 3.4–10.8)

## 2017-11-05 LAB — GC/CHLAMYDIA PROBE AMP
CHLAMYDIA, DNA PROBE: NEGATIVE
NEISSERIA GONORRHOEAE BY PCR: NEGATIVE

## 2017-11-05 LAB — RUBELLA SCREEN: Rubella Antibodies, IGG: 6.34 index (ref >0.99)

## 2017-11-05 LAB — URINALYSIS, ROUTINE W REFLEX MICROSCOPIC
Bilirubin, UA: NEGATIVE
Glucose, UA: NEGATIVE
KETONES UA: NEGATIVE
LEUKOCYTES UA: NEGATIVE
Nitrite, UA: NEGATIVE
PROTEIN UA: NEGATIVE
RBC, UA: NEGATIVE
SPEC GRAV UA: 1.026 (ref 1.005–1.030)
Urobilinogen, Ur: 0.2 mg/dL (ref 0.2–1.0)
pH, UA: 6.5 (ref 5.0–7.5)

## 2017-11-05 LAB — HEPATITIS B SURFACE ANTIGEN: HEP B S AG: NEGATIVE

## 2017-11-05 LAB — ANTIBODY SCREEN: ANTIBODY SCREEN: NEGATIVE

## 2017-11-05 LAB — RPR: RPR: NONREACTIVE

## 2017-11-05 LAB — HIV ANTIBODY (ROUTINE TESTING W REFLEX): HIV SCREEN 4TH GENERATION: NONREACTIVE

## 2017-11-06 LAB — URINE CULTURE

## 2017-12-02 ENCOUNTER — Other Ambulatory Visit: Payer: Self-pay

## 2017-12-02 ENCOUNTER — Encounter: Payer: Self-pay | Admitting: Obstetrics & Gynecology

## 2017-12-02 ENCOUNTER — Ambulatory Visit (INDEPENDENT_AMBULATORY_CARE_PROVIDER_SITE_OTHER): Payer: 59 | Admitting: Obstetrics & Gynecology

## 2017-12-02 VITALS — BP 120/64 | HR 99 | Wt 211.0 lb

## 2017-12-02 DIAGNOSIS — R87610 Atypical squamous cells of undetermined significance on cytologic smear of cervix (ASC-US): Secondary | ICD-10-CM | POA: Diagnosis not present

## 2017-12-02 DIAGNOSIS — Z1389 Encounter for screening for other disorder: Secondary | ICD-10-CM | POA: Diagnosis not present

## 2017-12-02 DIAGNOSIS — Z3481 Encounter for supervision of other normal pregnancy, first trimester: Secondary | ICD-10-CM

## 2017-12-02 DIAGNOSIS — R87810 Cervical high risk human papillomavirus (HPV) DNA test positive: Secondary | ICD-10-CM | POA: Diagnosis not present

## 2017-12-02 DIAGNOSIS — Z331 Pregnant state, incidental: Secondary | ICD-10-CM | POA: Diagnosis not present

## 2017-12-02 LAB — POCT URINALYSIS DIPSTICK
Blood, UA: NEGATIVE
GLUCOSE UA: NEGATIVE
Ketones, UA: NEGATIVE
LEUKOCYTES UA: NEGATIVE
Nitrite, UA: NEGATIVE
Protein, UA: NEGATIVE

## 2017-12-02 NOTE — Progress Notes (Signed)
Colposcopy Procedure Note:  Colposcopy Procedure Note  Indications: Pap smear 6 months ago showed: ASCUS with POSITIVE high risk HPV. The prior pap showed ASCUS with POSITIVE high risk HPV.  Prior cervical/vaginal disease: . Prior cervical treatment: no treatment.  Smoker:  Yes.   New sexual partner:  No.  : time frame:    History of abnormal Pap: yes  Procedure Details  The risks and benefits of the procedure and Written informed consent obtained.  Speculum placed in vagina and excellent visualization of cervix achieved, cervix swabbed x 3 with acetic acid solution.  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  Has patient is [redacted] weeks pregnant Vaginal inspection: normal without visible lesions. Vulvar colposcopy: vulvar colposcopy not performed.  Specimens: None  Complications: none.  Plan: Return in about 5 weeks (around 01/06/2018) for 20 week sono, LROB.  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

## 2017-12-26 ENCOUNTER — Ambulatory Visit (INDEPENDENT_AMBULATORY_CARE_PROVIDER_SITE_OTHER): Payer: 59 | Admitting: Adult Health

## 2017-12-26 ENCOUNTER — Telehealth: Payer: Self-pay | Admitting: *Deleted

## 2017-12-26 ENCOUNTER — Encounter: Payer: Self-pay | Admitting: Adult Health

## 2017-12-26 VITALS — BP 108/62 | HR 119 | Wt 222.5 lb

## 2017-12-26 DIAGNOSIS — M545 Low back pain, unspecified: Secondary | ICD-10-CM

## 2017-12-26 DIAGNOSIS — N898 Other specified noninflammatory disorders of vagina: Secondary | ICD-10-CM | POA: Diagnosis not present

## 2017-12-26 DIAGNOSIS — G8929 Other chronic pain: Secondary | ICD-10-CM | POA: Diagnosis not present

## 2017-12-26 DIAGNOSIS — B9689 Other specified bacterial agents as the cause of diseases classified elsewhere: Secondary | ICD-10-CM | POA: Diagnosis not present

## 2017-12-26 DIAGNOSIS — N760 Acute vaginitis: Secondary | ICD-10-CM

## 2017-12-26 DIAGNOSIS — Z331 Pregnant state, incidental: Secondary | ICD-10-CM | POA: Diagnosis not present

## 2017-12-26 DIAGNOSIS — Z1389 Encounter for screening for other disorder: Secondary | ICD-10-CM

## 2017-12-26 LAB — POCT URINALYSIS DIPSTICK
Blood, UA: NEGATIVE
Glucose, UA: NEGATIVE
KETONES UA: NEGATIVE
Leukocytes, UA: NEGATIVE
NITRITE UA: NEGATIVE
PROTEIN UA: NEGATIVE

## 2017-12-26 LAB — POCT WET PREP (WET MOUNT)
Clue Cells Wet Prep Whiff POC: NEGATIVE
WBC, Wet Prep HPF POC: POSITIVE

## 2017-12-26 MED ORDER — METRONIDAZOLE 500 MG PO TABS: 500.0000 mg | ORAL_TABLET | Freq: Two times a day (BID) | ORAL | 0 refills | Status: DC

## 2017-12-26 NOTE — Progress Notes (Signed)
G2P0010 [redacted]w[redacted]d Estimated Date of Delivery: 06/04/18 Work in for green vaginal discharge and back pain Pt complaining of green discharge since last night and has back pain, but htat is chronic, has had since 15 and had surgery, she lifts at work, which is [employer].  Blood pressure 108/62, pulse (!) 119, weight 222 lb 8 oz (100.9 kg), last menstrual period 08/28/2017.  Skin warm and dry.Pelvic: external genitalia is normal in appearance no lesions, vagina: white discharge without odor,urethra has no lesions or masses noted, cervix:smooth, NO CMT and closed, uterus: about 18 week size,FHR 150 via doppler, non tender, no masses felt, adnexa: no masses or tenderness noted. Bladder is non tender and no masses felt. Wet prep: + for clue cells and +WBCs.  BP weight and urine results all reviewed and noted.  Please refer to the obstetrical flow sheet for the fundal height and fetal heart rate documentation:  Patient  denies any bleeding and no rupture of membranes symptoms or regular contractions. All questions were answered.  Orders Placed This Encounter  Procedures  . POCT urinalysis dipstick  . POCT Wet Prep [facility])    Plan:  Continued routine obstetrical care Rx flagyl 500 mg 1 bid x 7 days, no alcohol,no sex review handout on BV Given note to return to work 12/28/17 and limit lifting to <25 lbs.       Return for as scheduled 4/19.

## 2017-12-26 NOTE — Telephone Encounter (Signed)
Patient states last night she noticed green vaginal discharge, no bleeding or leaking but is now having constant lower back pain.  Will w/i with Anderson Malta for eval.  Will come at 12:15.

## 2017-12-26 NOTE — Patient Instructions (Signed)
Bacterial Vaginosis Bacterial vaginosis is a vaginal infection that occurs when the normal balance of bacteria in the vagina is disrupted. It results from an overgrowth of certain bacteria. This is the most common vaginal infection among women ages 15-44. Because bacterial vaginosis increases your risk for STIs (sexually transmitted infections), getting treated can help reduce your risk for chlamydia, gonorrhea, herpes, and HIV (human immunodeficiency virus). Treatment is also important for preventing complications in pregnant women, because this condition can cause an early (premature) delivery. What are the causes? This condition is caused by an increase in harmful bacteria that are normally present in small amounts in the vagina. However, the reason that the condition develops is not fully understood. What increases the risk? The following factors may make you more likely to develop this condition:  Having a new sexual partner or multiple sexual partners.  Having unprotected sex.  Douching.  Having an intrauterine device (IUD).  Smoking.  Drug and alcohol abuse.  Taking certain antibiotic medicines.  Being pregnant.  You cannot get bacterial vaginosis from toilet seats, bedding, swimming pools, or contact with objects around you. What are the signs or symptoms? Symptoms of this condition include:  Grey or white vaginal discharge. The discharge can also be watery or foamy.  A fish-like odor with discharge, especially after sexual intercourse or during menstruation.  Itching in and around the vagina.  Burning or pain with urination.  Some women with bacterial vaginosis have no signs or symptoms. How is this diagnosed? This condition is diagnosed based on:  Your medical history.  A physical exam of the vagina.  Testing a sample of vaginal fluid under a microscope to look for a large amount of bad bacteria or abnormal cells. Your health care provider may use a cotton swab  or a small wooden spatula to collect the sample.  How is this treated? This condition is treated with antibiotics. These may be given as a pill, a vaginal cream, or a medicine that is put into the vagina (suppository). If the condition comes back after treatment, a second round of antibiotics may be needed. Follow these instructions at home: Medicines  Take over-the-counter and prescription medicines only as told by your health care provider.  Take or use your antibiotic as told by your health care provider. Do not stop taking or using the antibiotic even if you start to feel better. General instructions  If you have a female sexual partner, tell her that you have a vaginal infection. She should see her health care provider and be treated if she has symptoms. If you have a female sexual partner, he does not need treatment.  During treatment: ? Avoid sexual activity until you finish treatment. ? Do not douche. ? Avoid alcohol as directed by your health care provider. ? Avoid breastfeeding as directed by your health care provider.  Drink enough water and fluids to keep your urine clear or pale yellow.  Keep the area around your vagina and rectum clean. ? Wash the area daily with warm water. ? Wipe yourself from front to back after using the toilet.  Keep all follow-up visits as told by your health care provider. This is important. How is this prevented?  Do not douche.  Wash the outside of your vagina with warm water only.  Use protection when having sex. This includes latex condoms and dental dams.  Limit how many sexual partners you have. To help prevent bacterial vaginosis, it is best to have sex with just   one partner (monogamous).  Make sure you and your sexual partner are tested for STIs.  Wear cotton or cotton-lined underwear.  Avoid wearing tight pants and pantyhose, especially during summer.  Limit the amount of alcohol that you drink.  Do not use any products that  contain nicotine or tobacco, such as cigarettes and e-cigarettes. If you need help quitting, ask your health care provider.  Do not use illegal drugs. Where to find more information:  Centers for Disease Control and Prevention: www.cdc.gov/std  American Sexual Health Association (ASHA): www.ashastd.org  [place] Department of Health and Human Services, Office on Women's Health: www.womenshealth.gov/ or https://www.womenshealth.gov/a-z-topics/bacterial-vaginosis Contact a health care provider if:  Your symptoms do not improve, even after treatment.  You have more discharge or pain when urinating.  You have a fever.  You have pain in your abdomen.  You have pain during sex.  You have vaginal bleeding between periods. Summary  Bacterial vaginosis is a vaginal infection that occurs when the normal balance of bacteria in the vagina is disrupted.  Because bacterial vaginosis increases your risk for STIs (sexually transmitted infections), getting treated can help reduce your risk for chlamydia, gonorrhea, herpes, and HIV (human immunodeficiency virus). Treatment is also important for preventing complications in pregnant women, because the condition can cause an early (premature) delivery.  This condition is treated with antibiotic medicines. These may be given as a pill, a vaginal cream, or a medicine that is put into the vagina (suppository). This information is not intended to replace advice given to you by your health care provider. Make sure you discuss any questions you have with your health care provider. Document Released: 09/09/2005 Document Revised: 01/13/2017 Document Reviewed: 05/25/2016 Else[employer] Interactive Patient Education  2018 [employer]  

## 2017-12-26 NOTE — Progress Notes (Signed)
Light green discharge last night. None since then.

## 2018-01-05 ENCOUNTER — Other Ambulatory Visit: Payer: Self-pay | Admitting: Obstetrics & Gynecology

## 2018-01-05 DIAGNOSIS — Z363 Encounter for antenatal screening for malformations: Secondary | ICD-10-CM

## 2018-01-06 ENCOUNTER — Ambulatory Visit (INDEPENDENT_AMBULATORY_CARE_PROVIDER_SITE_OTHER): Payer: 59

## 2018-01-06 ENCOUNTER — Encounter: Payer: Self-pay | Admitting: Women's Health

## 2018-01-06 ENCOUNTER — Ambulatory Visit (INDEPENDENT_AMBULATORY_CARE_PROVIDER_SITE_OTHER): Payer: 59 | Admitting: Women's Health

## 2018-01-06 VITALS — BP 118/58 | HR 107 | Wt 225.0 lb

## 2018-01-06 DIAGNOSIS — Z3A18 18 weeks gestation of pregnancy: Secondary | ICD-10-CM

## 2018-01-06 DIAGNOSIS — Z363 Encounter for antenatal screening for malformations: Secondary | ICD-10-CM | POA: Diagnosis not present

## 2018-01-06 DIAGNOSIS — Z8742 Personal history of other diseases of the female genital tract: Secondary | ICD-10-CM

## 2018-01-06 DIAGNOSIS — Z3482 Encounter for supervision of other normal pregnancy, second trimester: Secondary | ICD-10-CM

## 2018-01-06 DIAGNOSIS — Z3402 Encounter for supervision of normal first pregnancy, second trimester: Secondary | ICD-10-CM

## 2018-01-06 DIAGNOSIS — Z1389 Encounter for screening for other disorder: Secondary | ICD-10-CM

## 2018-01-06 DIAGNOSIS — Z331 Pregnant state, incidental: Secondary | ICD-10-CM

## 2018-01-06 LAB — POCT URINALYSIS DIPSTICK
GLUCOSE UA: NEGATIVE
Ketones, UA: NEGATIVE
LEUKOCYTES UA: NEGATIVE
NITRITE UA: NEGATIVE
PROTEIN UA: NEGATIVE
RBC UA: NEGATIVE

## 2018-01-06 NOTE — Patient Instructions (Signed)
Debbie Santana, I greatly value your feedback.  If you receive a survey following your visit with [place] today, we appreciate you taking the time to fill it out.  Thanks, Knute Neu, CNM, WHNP-BC   Second Trimester of Pregnancy The second trimester is from week 14 through week 27 (months 4 through 6). The second trimester is often a time when you feel your best. Your body has adjusted to being pregnant, and you begin to feel better physically. Usually, morning sickness has lessened or quit completely, you may have more energy, and you may have an increase in appetite. The second trimester is also a time when the fetus is growing rapidly. At the end of the sixth month, the fetus is about 9 inches long and weighs about 1 pounds. You will likely begin to feel the baby move (quickening) between 16 and 20 weeks of pregnancy. Body changes during your second trimester Your body continues to go through many changes during your second trimester. The changes vary from woman to woman.  Your weight will continue to increase. You will notice your lower abdomen bulging out.  You may begin to get stretch marks on your hips, abdomen, and breasts.  You may develop headaches that can be relieved by medicines. The medicines should be approved by your health care provider.  You may urinate more often because the fetus is pressing on your bladder.  You may develop or continue to have heartburn as a result of your pregnancy.  You may develop constipation because certain hormones are causing the muscles that push waste through your intestines to slow down.  You may develop hemorrhoids or swollen, bulging veins (varicose veins).  You may have back pain. This is caused by: ? Weight gain. ? Pregnancy hormones that are relaxing the joints in your pelvis. ? A shift in weight and the muscles that support your balance.  Your breasts will continue to grow and they will continue to become tender.  Your gums may bleed and  may be sensitive to brushing and flossing.  Dark spots or blotches (chloasma, mask of pregnancy) may develop on your face. This will likely fade after the baby is born.  A dark line from your belly button to the pubic area (linea nigra) may appear. This will likely fade after the baby is born.  You may have changes in your hair. These can include thickening of your hair, rapid growth, and changes in texture. Some women also have hair loss during or after pregnancy, or hair that feels dry or thin. Your hair will most likely return to normal after your baby is born.  What to expect at prenatal visits During a routine prenatal visit:  You will be weighed to make sure you and the fetus are growing normally.  Your blood pressure will be taken.  Your abdomen will be measured to track your baby's growth.  The fetal heartbeat will be listened to.  Any test results from the previous visit will be discussed.  Your health care provider may ask you:  How you are feeling.  If you are feeling the baby move.  If you have had any abnormal symptoms, such as leaking fluid, bleeding, severe headaches, or abdominal cramping.  If you are using any tobacco products, including cigarettes, chewing tobacco, and electronic cigarettes.  If you have any questions.  Other tests that may be performed during your second trimester include:  Blood tests that check for: ? Low iron levels (anemia). ? High  blood sugar that affects pregnant women (gestational diabetes) between 3 and 28 weeks. ? Rh antibodies. This is to check for a protein on red blood cells (Rh factor).  Urine tests to check for infections, diabetes, or protein in the urine.  An ultrasound to confirm the proper growth and development of the baby.  An amniocentesis to check for possible genetic problems.  Fetal screens for spina bifida and Down syndrome.  HIV (human immunodeficiency virus) testing. Routine prenatal testing includes  screening for HIV, unless you choose not to have this test.  Follow these instructions at home: Medicines  Follow your health care provider's instructions regarding medicine use. Specific medicines may be either safe or unsafe to take during pregnancy.  Take a prenatal vitamin that contains at least 600 micrograms (mcg) of folic acid.  If you develop constipation, try taking a stool softener if your health care provider approves. Eating and drinking  Eat a balanced diet that includes fresh fruits and vegetables, whole grains, good sources of protein such as meat, eggs, or tofu, and low-fat dairy. Your health care provider will help you determine the amount of weight gain that is right for you.  Avoid raw meat and uncooked cheese. These carry germs that can cause birth defects in the baby.  If you have low calcium intake from food, talk to your health care provider about whether you should take a daily calcium supplement.  Limit foods that are high in fat and processed sugars, such as fried and sweet foods.  To prevent constipation: ? Drink enough fluid to keep your urine clear or pale yellow. ? Eat foods that are high in fiber, such as fresh fruits and vegetables, whole grains, and beans. Activity  Exercise only as directed by your health care provider. Most women can continue their usual exercise routine during pregnancy. Try to exercise for 30 minutes at least 5 days a week. Stop exercising if you experience uterine contractions.  Avoid heavy lifting, wear low heel shoes, and practice good posture.  A sexual relationship may be continued unless your health care provider directs you otherwise. Relieving pain and discomfort  Wear a good support bra to prevent discomfort from breast tenderness.  Take warm sitz baths to soothe any pain or discomfort caused by hemorrhoids. Use hemorrhoid cream if your health care provider approves.  Rest with your legs elevated if you have leg cramps  or low back pain.  If you develop varicose veins, wear support hose. Elevate your feet for 15 minutes, 3-4 times a day. Limit salt in your diet. Prenatal Care  Write down your questions. Take them to your prenatal visits.  Keep all your prenatal visits as told by your health care provider. This is important. Safety  Wear your seat belt at all times when driving.  Make a list of emergency phone numbers, including numbers for family, friends, the hospital, and police and fire departments. General instructions  Ask your health care provider for a referral to a local prenatal education class. Begin classes no later than the beginning of month 6 of your pregnancy.  Ask for help if you have counseling or nutritional needs during pregnancy. Your health care provider can offer advice or refer you to specialists for help with various needs.  Do not use hot tubs, steam rooms, or saunas.  Do not douche or use tampons or scented sanitary pads.  Do not cross your legs for long periods of time.  Avoid cat litter boxes and soil  used by cats. These carry germs that can cause birth defects in the baby and possibly loss of the fetus by miscarriage or stillbirth.  Avoid all smoking, herbs, alcohol, and unprescribed drugs. Chemicals in these products can affect the formation and growth of the baby.  Do not use any products that contain nicotine or tobacco, such as cigarettes and e-cigarettes. If you need help quitting, ask your health care provider.  Visit your dentist if you have not gone yet during your pregnancy. Use a soft toothbrush to brush your teeth and be gentle when you floss. Contact a health care provider if:  You have dizziness.  You have mild pelvic cramps, pelvic pressure, or nagging pain in the abdominal area.  You have persistent nausea, vomiting, or diarrhea.  You have a bad smelling vaginal discharge.  You have pain when you urinate. Get help right away if:  You have a  fever.  You are leaking fluid from your vagina.  You have spotting or bleeding from your vagina.  You have severe abdominal cramping or pain.  You have rapid weight gain or weight loss.  You have shortness of breath with chest pain.  You notice sudden or extreme swelling of your face, hands, ankles, feet, or legs.  You have not felt your baby move in over an hour.  You have severe headaches that do not go away when you take medicine.  You have vision changes. Summary  The second trimester is from week 14 through week 27 (months 4 through 6). It is also a time when the fetus is growing rapidly.  Your body goes through many changes during pregnancy. The changes vary from woman to woman.  Avoid all smoking, herbs, alcohol, and unprescribed drugs. These chemicals affect the formation and growth your baby.  Do not use any tobacco products, such as cigarettes, chewing tobacco, and e-cigarettes. If you need help quitting, ask your health care provider.  Contact your health care provider if you have any questions. Keep all prenatal visits as told by your health care provider. This is important. This information is not intended to replace advice given to you by your health care provider. Make sure you discuss any questions you have with your health care provider. Document Released: 09/03/2001 Document Revised: 02/15/2016 Document Reviewed: 11/10/2012 Elsevier Interactive Patient Education  2017 [employer].

## 2018-01-06 NOTE — Progress Notes (Signed)
[place] 51+8 wks,cephalic,cx 4.1 cm,anterior pl gr 0,right ovary not visualized,normal left ovary,svp of fluid 4.3 cm,fhr 146 bpm,efw 257 g,anatomy complete,no obvious abnormalities

## 2018-01-06 NOTE — Progress Notes (Signed)
   LOW-RISK PREGNANCY VISIT Patient name: Debbie Santana MRN [id number]  Date of birth: Feb 13, 1991 Chief Complaint:   Routine Prenatal Visit ([place] today; swelling in ankles)  History of Present Illness:   Debbie Santana is a 27 y.o. G2P0010 female at [redacted]w[redacted]d with an Estimated Date of Delivery: 06/04/18 being seen today for ongoing management of a low-risk pregnancy.  Today she reports some swelling in ankles.  . Vag. Bleeding: None.   . denies leaking of fluid. Review of Systems:   Pertinent items are noted in HPI Denies abnormal vaginal discharge w/ itching/odor/irritation, headaches, visual changes, shortness of breath, chest pain, abdominal pain, severe nausea/vomiting, or problems with urination or bowel movements unless otherwise stated above. Pertinent History Reviewed:  Reviewed past medical,surgical, social, obstetrical and family history.  Reviewed problem list, medications and allergies. Physical Assessment:   Vitals:   01/06/18 1031  BP: (!) 118/58  Pulse: (!) 107  Weight: 225 lb (102.1 kg)  Body mass index is 31.38 kg/m.        Physical Examination:   General appearance: Well appearing, and in no distress  Mental status: Alert, oriented to person, place, and time  Skin: Warm & dry  Cardiovascular: Normal heart rate noted  Respiratory: Normal respiratory effort, no distress  Abdomen: Soft, gravid, nontender  Pelvic: Cervical exam deferred         Extremities: Edema: Trace  Fetal Status:         [place] 38+1 wks,cephalic,cx 4.1 cm,anterior pl gr 0,right ovary not visualized,normal left ovary,svp of fluid 4.3 cm,fhr 146 bpm,efw 257 g,anatomy complete,no obvious abnormalities    Results for orders placed or performed in visit on 01/06/18 (from the past 24 hour(s))  POCT urinalysis dipstick   Collection Time: 01/06/18 10:33 AM  Result Value Ref Range   Color, UA     Clarity, UA     Glucose, UA neg    Bilirubin, UA     Ketones, UA neg    Spec Grav, UA  1.010 - 1.025   Blood, UA neg    pH, UA  5.0 - 8.0   Protein, UA neg    Urobilinogen, UA  0.2 or 1.0 E.U./dL   Nitrite, UA neg    Leukocytes, UA Negative Negative   Appearance     Odor      Assessment & Plan:  1) Low-risk pregnancy G2P0010 at [redacted]w[redacted]d with an Estimated Date of Delivery: 06/04/18    Meds: No orders of the defined types were placed in this encounter.  Labs/procedures today: anatomy u/s  Plan:  Continue routine obstetrical care   Reviewed: Preterm labor symptoms and general obstetric precautions including but not limited to vaginal bleeding, contractions, leaking of fluid and fetal movement were reviewed in detail with the patient.  All questions were answered  Follow-up: Return in about 1 month (around 02/03/2018) for [place].  Orders Placed This Encounter  Procedures  . POCT urinalysis dipstick   Roma Schanz CNM, [facility] 01/06/2018 11:05 AM

## 2018-02-03 ENCOUNTER — Ambulatory Visit (INDEPENDENT_AMBULATORY_CARE_PROVIDER_SITE_OTHER): Payer: 59 | Admitting: Women's Health

## 2018-02-03 ENCOUNTER — Encounter: Payer: Self-pay | Admitting: Women's Health

## 2018-02-03 VITALS — BP 120/60 | HR 98 | Wt 232.0 lb

## 2018-02-03 DIAGNOSIS — Z331 Pregnant state, incidental: Secondary | ICD-10-CM

## 2018-02-03 DIAGNOSIS — O26892 Other specified pregnancy related conditions, second trimester: Secondary | ICD-10-CM

## 2018-02-03 DIAGNOSIS — Z1389 Encounter for screening for other disorder: Secondary | ICD-10-CM

## 2018-02-03 DIAGNOSIS — N898 Other specified noninflammatory disorders of vagina: Secondary | ICD-10-CM

## 2018-02-03 DIAGNOSIS — Z3A22 22 weeks gestation of pregnancy: Secondary | ICD-10-CM

## 2018-02-03 DIAGNOSIS — Z3482 Encounter for supervision of other normal pregnancy, second trimester: Secondary | ICD-10-CM

## 2018-02-03 LAB — POCT URINALYSIS DIPSTICK
Blood, UA: NEGATIVE
GLUCOSE UA: NEGATIVE
Ketones, UA: NEGATIVE
LEUKOCYTES UA: NEGATIVE
Nitrite, UA: NEGATIVE

## 2018-02-03 LAB — POCT WET PREP (WET MOUNT)
CLUE CELLS WET PREP WHIFF POC: NEGATIVE
TRICHOMONAS WET PREP HPF POC: ABSENT

## 2018-02-03 MED ORDER — NYSTATIN-TRIAMCINOLONE 100000-0.1 UNIT/GM-% EX OINT: 1.0000 "application " | TOPICAL_OINTMENT | Freq: Two times a day (BID) | CUTANEOUS | 0 refills | Status: DC

## 2018-02-03 NOTE — Patient Instructions (Signed)
Debbie Santana, I greatly value your feedback.  If you receive a survey following your visit with [place] today, we appreciate you taking the time to fill it out.  Thanks, Knute Neu, CNM, WHNP-BC   You will have your sugar test next visit.  Please do not eat or drink anything after midnight the night before you come, not even water.  You will be here for at least two hours.     Call the office [phone]) or go to [facility] if:  You begin to have strong, frequent contractions  Your water breaks.  Sometimes it is a big gush of fluid, sometimes it is just a trickle that keeps getting your panties wet or running down your legs  You have vaginal bleeding.  It is normal to have a small amount of spotting if your cervix was checked.   You don't feel your baby moving like normal.  If you don't, get you something to eat and drink and lay down and focus on feeling your baby move.   If your baby is still not moving like normal, you should call the office or go to [place] of Pregnancy The second trimester is from week 13 through week 28, months 4 through 6. The second trimester is often a time when you feel your best. Your body has also adjusted to being pregnant, and you begin to feel better physically. Usually, morning sickness has lessened or quit completely, you may have more energy, and you may have an increase in appetite. The second trimester is also a time when the fetus is growing rapidly. At the end of the sixth month, the fetus is about 9 inches long and weighs about 1 pounds. You will likely begin to feel the baby move (quickening) between 18 and 20 weeks of the pregnancy. BODY CHANGES Your body goes through many changes during pregnancy. The changes vary from woman to woman.   Your weight will continue to increase. You will notice your lower abdomen bulging out.  You may begin to get stretch marks on your hips, abdomen, and breasts.  You may develop headaches  that can be relieved by medicines approved by your health care provider.  You may urinate more often because the fetus is pressing on your bladder.  You may develop or continue to have heartburn as a result of your pregnancy.  You may develop constipation because certain hormones are causing the muscles that push waste through your intestines to slow down.  You may develop hemorrhoids or swollen, bulging veins (varicose veins).  You may have back pain because of the weight gain and pregnancy hormones relaxing your joints between the bones in your pelvis and as a result of a shift in weight and the muscles that support your balance.  Your breasts will continue to grow and be tender.  Your gums may bleed and may be sensitive to brushing and flossing.  Dark spots or blotches (chloasma, mask of pregnancy) may develop on your face. This will likely fade after the baby is born.  A dark line from your belly button to the pubic area (linea nigra) may appear. This will likely fade after the baby is born.  You may have changes in your hair. These can include thickening of your hair, rapid growth, and changes in texture. Some women also have hair loss during or after pregnancy, or hair that feels dry or thin. Your hair will most likely return to normal after your  baby is born. WHAT TO EXPECT AT YOUR PRENATAL VISITS During a routine prenatal visit:  You will be weighed to make sure you and the fetus are growing normally.  Your blood pressure will be taken.  Your abdomen will be measured to track your baby's growth.  The fetal heartbeat will be listened to.  Any test results from the previous visit will be discussed. Your health care provider may ask you:  How you are feeling.  If you are feeling the baby move.  If you have had any abnormal symptoms, such as leaking fluid, bleeding, severe headaches, or abdominal cramping.  If you have any questions. Other tests that may be performed  during your second trimester include:  Blood tests that check for:  Low iron levels (anemia).  Gestational diabetes (between 24 and 28 weeks).  Rh antibodies.  Urine tests to check for infections, diabetes, or protein in the urine.  An ultrasound to confirm the proper growth and development of the baby.  An amniocentesis to check for possible genetic problems.  Fetal screens for spina bifida and Down syndrome. HOME CARE INSTRUCTIONS   Avoid all smoking, herbs, alcohol, and unprescribed drugs. These chemicals affect the formation and growth of the baby.  Follow your health care provider's instructions regarding medicine use. There are medicines that are either safe or unsafe to take during pregnancy.  Exercise only as directed by your health care provider. Experiencing uterine cramps is a good sign to stop exercising.  Continue to eat regular, healthy meals.  Wear a good support bra for breast tenderness.  Do not use hot tubs, steam rooms, or saunas.  Wear your seat belt at all times when driving.  Avoid raw meat, uncooked cheese, cat litter boxes, and soil used by cats. These carry germs that can cause birth defects in the baby.  Take your prenatal vitamins.  Try taking a stool softener (if your health care provider approves) if you develop constipation. Eat more high-fiber foods, such as fresh vegetables or fruit and whole grains. Drink plenty of fluids to keep your urine clear or pale yellow.  Take warm sitz baths to soothe any pain or discomfort caused by hemorrhoids. Use hemorrhoid cream if your health care provider approves.  If you develop varicose veins, wear support hose. Elevate your feet for 15 minutes, 3-4 times a day. Limit salt in your diet.  Avoid heavy lifting, wear low heel shoes, and practice good posture.  Rest with your legs elevated if you have leg cramps or low back pain.  Visit your dentist if you have not gone yet during your pregnancy. Use a soft  toothbrush to brush your teeth and be gentle when you floss.  A sexual relationship may be continued unless your health care provider directs you otherwise.  Continue to go to all your prenatal visits as directed by your health care provider. SEEK MEDICAL CARE IF:   You have dizziness.  You have mild pelvic cramps, pelvic pressure, or nagging pain in the abdominal area.  You have persistent nausea, vomiting, or diarrhea.  You have a bad smelling vaginal discharge.  You have pain with urination. SEEK IMMEDIATE MEDICAL CARE IF:   You have a fever.  You are leaking fluid from your vagina.  You have spotting or bleeding from your vagina.  You have severe abdominal cramping or pain.  You have rapid weight gain or loss.  You have shortness of breath with chest pain.  You notice sudden or extreme  swelling of your face, hands, ankles, feet, or legs.  You have not felt your baby move in over an hour.  You have severe headaches that do not go away with medicine.  You have vision changes. Document Released: 09/03/2001 Document Revised: 09/14/2013 Document Reviewed: 11/10/2012 [facility] Patient Information 2015 [place], [place]. This information is not intended to replace advice given to you by your health care provider. Make sure you discuss any questions you have with your health care provider.

## 2018-02-03 NOTE — Progress Notes (Signed)
LOW-RISK PREGNANCY VISIT Patient name: Debbie Santana MRN [id number]  Date of birth: [birth year] Chief Complaint:   low risk pregnancy (still have vaginal discharge)  History of Present Illness:   Debbie Santana is a 27 y.o. G2P0010 female at [redacted]w[redacted]d with an Estimated Date of Delivery: 06/04/18 being seen today for ongoing management of a low-risk pregnancy.  Today she reports continued green discharge, some internal & external irritation, no itching, no odor.  .  .  Movement: Present. denies leaking of fluid. Review of Systems:   Pertinent items are noted in HPI Denies abnormal vaginal discharge w/ itching/odor/irritation, headaches, visual changes, shortness of breath, chest pain, abdominal pain, severe nausea/vomiting, or problems with urination or bowel movements unless otherwise stated above. Pertinent History Reviewed:  Reviewed past medical,surgical, social, obstetrical and family history.  Reviewed problem list, medications and allergies. Physical Assessment:   Vitals:   02/03/18 1025  BP: 120/60  Pulse: 98  Weight: 232 lb (105.2 kg)  Body mass index is 32.36 kg/m.        Physical Examination:   General appearance: Well appearing, and in no distress  Mental status: Alert, oriented to person, place, and time  Skin: Warm & dry  Cardiovascular: Normal heart rate noted  Respiratory: Normal respiratory effort, no distress  Abdomen: Soft, gravid, nontender  Pelvic: spec exam: cx visually closed, small amt white slightly yellow nonodorous d/c         Extremities: Edema: Trace  Fetal Status: Fetal Heart Rate (bpm): 154 Fundal Height: 23 cm Movement: Present    Results for orders placed or performed in visit on 02/03/18 (from the past 24 hour(s))  POCT urinalysis dipstick   Collection Time: 02/03/18 10:35 AM  Result Value Ref Range   Color, UA     Clarity, UA     Glucose, UA neg    Bilirubin, UA     Ketones, UA neg    Spec Grav, UA  1.010 - 1.025   Blood, UA neg    pH, UA   5.0 - 8.0   Protein, UA trace    Urobilinogen, UA  0.2 or 1.0 E.U./dL   Nitrite, UA neg    Leukocytes, UA Negative Negative   Appearance     Odor    POCT Wet Prep [facility])   Collection Time: 02/03/18 11:17 AM  Result Value Ref Range   Source Wet Prep POC vaginal    WBC, Wet Prep HPF POC none    Bacteria Wet Prep HPF POC Few Few   BACTERIA WET PREP MORPHOLOGY POC     Clue Cells Wet Prep HPF POC None None   Clue Cells Wet Prep Whiff POC Negative Whiff    Yeast Wet Prep HPF POC None    KOH Wet Prep POC     Trichomonas Wet Prep HPF POC Absent Absent    Assessment & Plan:  1) Low-risk pregnancy G2P0010 at [redacted]w[redacted]d with an Estimated Date of Delivery: 06/04/18   2) vaginal d/c w/ vulvovaginal irritation, wet prep neg, send gc/ct, rx mycolog   Meds:  Meds ordered this encounter  Medications  . nystatin-triamcinolone ointment (MYCOLOG)    Sig: Apply 1 application topically 2 (two) times daily.    Dispense:  30 g    Refill:  0    Order Specific Question:   Supervising Provider    Answer:   Florian Buff [[id number]]   Labs/procedures today: wet prep, gc/ct  Plan:  Continue routine obstetrical  care   Reviewed: Preterm labor symptoms and general obstetric precautions including but not limited to vaginal bleeding, contractions, leaking of fluid and fetal movement were reviewed in detail with the patient.  All questions were answered  Follow-up: Return in about 1 month (around 03/03/2018) for LROB, PN2.  Orders Placed This Encounter  Procedures  . GC/Chlamydia Probe Amp  . POCT urinalysis dipstick  . POCT Wet Prep [facility] [place])   Roma Schanz CNM, [facility] 02/03/2018 11:20 AM

## 2018-02-04 LAB — GC/CHLAMYDIA PROBE AMP
CHLAMYDIA, DNA PROBE: NEGATIVE
Neisseria gonorrhoeae by PCR: NEGATIVE

## 2018-03-03 ENCOUNTER — Ambulatory Visit (INDEPENDENT_AMBULATORY_CARE_PROVIDER_SITE_OTHER): Payer: 59 | Admitting: Women's Health

## 2018-03-03 ENCOUNTER — Other Ambulatory Visit: Payer: 59

## 2018-03-03 ENCOUNTER — Encounter: Payer: Self-pay | Admitting: Women's Health

## 2018-03-03 VITALS — BP 108/65 | HR 100 | Wt 242.2 lb

## 2018-03-03 DIAGNOSIS — O99352 Diseases of the nervous system complicating pregnancy, second trimester: Secondary | ICD-10-CM

## 2018-03-03 DIAGNOSIS — Z3482 Encounter for supervision of other normal pregnancy, second trimester: Secondary | ICD-10-CM

## 2018-03-03 DIAGNOSIS — Z131 Encounter for screening for diabetes mellitus: Secondary | ICD-10-CM

## 2018-03-03 DIAGNOSIS — Z3A26 26 weeks gestation of pregnancy: Secondary | ICD-10-CM

## 2018-03-03 DIAGNOSIS — G5603 Carpal tunnel syndrome, bilateral upper limbs: Secondary | ICD-10-CM

## 2018-03-03 NOTE — Patient Instructions (Signed)
Debbie Santana, I greatly value your feedback.  If you receive a survey following your visit with [place] today, we appreciate you taking the time to fill it out.  Thanks, Knute Neu, CNM, WHNP-BC   Call the office [phone]) or go to [facility] if:  You begin to have strong, frequent contractions  Your water breaks.  Sometimes it is a big gush of fluid, sometimes it is just a trickle that keeps getting your panties wet or running down your legs  You have vaginal bleeding.  It is normal to have a small amount of spotting if your cervix was checked.   You don't feel your baby moving like normal.  If you don't, get you something to eat and drink and lay down and focus on feeling your baby move.  You should feel at least 10 movements in 2 hours.  If you don't, you should call the office or go to [facility].    Tdap Vaccine  It is recommended that you get the Tdap vaccine during the third trimester of EACH pregnancy to help protect your baby from getting pertussis (whooping cough)  27-36 weeks is the BEST time to do this so that you can pass the protection on to your baby. During pregnancy is better than after pregnancy, but if you are unable to get it during pregnancy it will be offered at the hospital.   You can get this vaccine at the health department or your family doctor  Everyone who will be around your baby should also be up-to-date on their vaccines. Adults (who are not pregnant) only need 1 dose of Tdap during adulthood.   Third Trimester of Pregnancy The third trimester is from week 29 through week 42, months 7 through 9. The third trimester is a time when the fetus is growing rapidly. At the end of the ninth month, the fetus is about 20 inches in length and weighs 6-10 pounds.  BODY CHANGES Your body goes through many changes during pregnancy. The changes vary from woman to woman.   Your weight will continue to increase. You can expect to gain 25-35 pounds (11-16 kg) by  the end of the pregnancy.  You may begin to get stretch marks on your hips, abdomen, and breasts.  You may urinate more often because the fetus is moving lower into your pelvis and pressing on your bladder.  You may develop or continue to have heartburn as a result of your pregnancy.  You may develop constipation because certain hormones are causing the muscles that push waste through your intestines to slow down.  You may develop hemorrhoids or swollen, bulging veins (varicose veins).  You may have pelvic pain because of the weight gain and pregnancy hormones relaxing your joints between the bones in your pelvis. Backaches may result from overexertion of the muscles supporting your posture.  You may have changes in your hair. These can include thickening of your hair, rapid growth, and changes in texture. Some women also have hair loss during or after pregnancy, or hair that feels dry or thin. Your hair will most likely return to normal after your baby is born.  Your breasts will continue to grow and be tender. A yellow discharge may leak from your breasts called colostrum.  Your belly button may stick out.  You may feel short of breath because of your expanding uterus.  You may notice the fetus "dropping," or moving lower in your abdomen.  You may have a bloody  mucus discharge. This usually occurs a few days to a week before labor begins.  Your cervix becomes thin and soft (effaced) near your due date. WHAT TO EXPECT AT YOUR PRENATAL EXAMS  You will have prenatal exams every 2 weeks until week 36. Then, you will have weekly prenatal exams. During a routine prenatal visit:  You will be weighed to make sure you and the fetus are growing normally.  Your blood pressure is taken.  Your abdomen will be measured to track your baby's growth.  The fetal heartbeat will be listened to.  Any test results from the previous visit will be discussed.  You may have a cervical check near your  due date to see if you have effaced. At around 36 weeks, your caregiver will check your cervix. At the same time, your caregiver will also perform a test on the secretions of the vaginal tissue. This test is to determine if a type of bacteria, Group B streptococcus, is present. Your caregiver will explain this further. Your caregiver may ask you:  What your birth plan is.  How you are feeling.  If you are feeling the baby move.  If you have had any abnormal symptoms, such as leaking fluid, bleeding, severe headaches, or abdominal cramping.  If you have any questions. Other tests or screenings that may be performed during your third trimester include:  Blood tests that check for low iron levels (anemia).  Fetal testing to check the health, activity level, and growth of the fetus. Testing is done if you have certain medical conditions or if there are problems during the pregnancy. FALSE LABOR You may feel small, irregular contractions that eventually go away. These are called Braxton Hicks contractions, or false labor. Contractions may last for hours, days, or even weeks before true labor sets in. If contractions come at regular intervals, intensify, or become painful, it is best to be seen by your caregiver.  SIGNS OF LABOR   Menstrual-like cramps.  Contractions that are 5 minutes apart or less.  Contractions that start on the top of the uterus and spread down to the lower abdomen and back.  A sense of increased pelvic pressure or back pain.  A watery or bloody mucus discharge that comes from the vagina. If you have any of these signs before the 37th week of pregnancy, call your caregiver right away. You need to go to the hospital to get checked immediately. HOME CARE INSTRUCTIONS   Avoid all smoking, herbs, alcohol, and unprescribed drugs. These chemicals affect the formation and growth of the baby.  Follow your caregiver's instructions regarding medicine use. There are medicines  that are either safe or unsafe to take during pregnancy.  Exercise only as directed by your caregiver. Experiencing uterine cramps is a good sign to stop exercising.  Continue to eat regular, healthy meals.  Wear a good support bra for breast tenderness.  Do not use hot tubs, steam rooms, or saunas.  Wear your seat belt at all times when driving.  Avoid raw meat, uncooked cheese, cat litter boxes, and soil used by cats. These carry germs that can cause birth defects in the baby.  Take your prenatal vitamins.  Try taking a stool softener (if your caregiver approves) if you develop constipation. Eat more high-fiber foods, such as fresh vegetables or fruit and whole grains. Drink plenty of fluids to keep your urine clear or pale yellow.  Take warm sitz baths to soothe any pain or discomfort caused by hemorrhoids.  Use hemorrhoid cream if your caregiver approves.  If you develop varicose veins, wear support hose. Elevate your feet for 15 minutes, 3-4 times a day. Limit salt in your diet.  Avoid heavy lifting, wear low heal shoes, and practice good posture.  Rest a lot with your legs elevated if you have leg cramps or low back pain.  Visit your dentist if you have not gone during your pregnancy. Use a soft toothbrush to brush your teeth and be gentle when you floss.  A sexual relationship may be continued unless your caregiver directs you otherwise.  Do not travel far distances unless it is absolutely necessary and only with the approval of your caregiver.  Take prenatal classes to understand, practice, and ask questions about the labor and delivery.  Make a trial run to the hospital.  Pack your hospital bag.  Prepare the baby's nursery.  Continue to go to all your prenatal visits as directed by your caregiver. SEEK MEDICAL CARE IF:  You are unsure if you are in labor or if your water has broken.  You have dizziness.  You have mild pelvic cramps, pelvic pressure, or nagging  pain in your abdominal area.  You have persistent nausea, vomiting, or diarrhea.  You have a bad smelling vaginal discharge.  You have pain with urination. SEEK IMMEDIATE MEDICAL CARE IF:   You have a fever.  You are leaking fluid from your vagina.  You have spotting or bleeding from your vagina.  You have severe abdominal cramping or pain.  You have rapid weight loss or gain.  You have shortness of breath with chest pain.  You notice sudden or extreme swelling of your face, hands, ankles, feet, or legs.  You have not felt your baby move in over an hour.  You have severe headaches that do not go away with medicine.  You have vision changes. Document Released: 09/03/2001 Document Revised: 09/14/2013 Document Reviewed: 11/10/2012 [employer] Patient Information 2015 [employer]. This information is not intended to replace advice given to you by your health care provider. Make sure you discuss any questions you have with your health care provider.   

## 2018-03-03 NOTE — Progress Notes (Signed)
   LOW-RISK PREGNANCY VISIT Patient name: Debbie Santana MRN [id number]  Date of birth: Oct 30, 1990 Chief Complaint:   low risk ob (PN2)  History of Present Illness:   Debbie Santana is a 27 y.o. G2P0010 female at [redacted]w[redacted]d with an Estimated Date of Delivery: 06/04/18 being seen today for ongoing management of a low-risk pregnancy.  Today she reports needs to have tooth pulled- dental release given. Numbness/tingling in wrists.  .  .  Movement: Present. denies leaking of fluid. Review of Systems:   Pertinent items are noted in HPI Denies abnormal vaginal discharge w/ itching/odor/irritation, headaches, visual changes, shortness of breath, chest pain, abdominal pain, severe nausea/vomiting, or problems with urination or bowel movements unless otherwise stated above. Pertinent History Reviewed:  Reviewed past medical,surgical, social, obstetrical and family history.  Reviewed problem list, medications and allergies. Physical Assessment:   Vitals:   03/03/18 0936  BP: 108/65  Pulse: 100  Weight: 242 lb 3.2 oz (109.9 kg)  Body mass index is 33.78 kg/m.        Physical Examination:   General appearance: Well appearing, and in no distress  Mental status: Alert, oriented to person, place, and time  Skin: Warm & dry  Cardiovascular: Normal heart rate noted  Respiratory: Normal respiratory effort, no distress  Abdomen: Soft, gravid, nontender  Pelvic: Cervical exam deferred         Extremities: Edema: Trace  Fetal Status: Fetal Heart Rate (bpm): 143 Fundal Height: 26 cm Movement: Present    No results found for this or any previous visit (from the past 24 hour(s)).  Assessment & Plan:  1) Low-risk pregnancy G2P0010 at [redacted]w[redacted]d with an Estimated Date of Delivery: 06/04/18   2) CTS, try wrist splints   Meds: No orders of the defined types were placed in this encounter.  Labs/procedures today: pn2  Plan:  Continue routine obstetrical care   Reviewed: Preterm labor symptoms and general  obstetric precautions including but not limited to vaginal bleeding, contractions, leaking of fluid and fetal movement were reviewed in detail with the patient.  Recommended Tdap at HD/PCP per CDC guidelines. All questions were answered  Follow-up: Return in about 1 month (around 03/31/2018) for [place].and tdap  No orders of the defined types were placed in this encounter.  [place], [facility] 03/03/2018 9:53 AM

## 2018-03-04 LAB — CBC
Hematocrit: 34.2 % (ref 34.0–46.6)
Hemoglobin: 11.5 g/dL (ref 11.1–15.9)
MCH: 32 pg (ref 26.6–33.0)
MCHC: 33.6 g/dL (ref 31.5–35.7)
MCV: 95 fL (ref 79–97)
PLATELETS: 245 10*3/uL (ref 150–450)
RBC: 3.59 x10E6/uL — ABNORMAL LOW (ref 3.77–5.28)
RDW: 12.6 % (ref 12.3–15.4)
WBC: 11.9 10*3/uL — ABNORMAL HIGH (ref 3.4–10.8)

## 2018-03-04 LAB — HIV ANTIBODY (ROUTINE TESTING W REFLEX): HIV SCREEN 4TH GENERATION: NONREACTIVE

## 2018-03-04 LAB — ANTIBODY SCREEN: Antibody Screen: NEGATIVE

## 2018-03-04 LAB — GLUCOSE TOLERANCE, 2 HOURS W/ 1HR
GLUCOSE, FASTING: 82 mg/dL (ref 65–91)
Glucose, 1 hour: 157 mg/dL (ref 65–179)
Glucose, 2 hour: 114 mg/dL (ref 65–152)

## 2018-03-04 LAB — RPR: RPR Ser Ql: NONREACTIVE

## 2018-03-31 ENCOUNTER — Encounter: Payer: 59 | Admitting: Obstetrics & Gynecology

## 2018-04-01 ENCOUNTER — Ambulatory Visit (INDEPENDENT_AMBULATORY_CARE_PROVIDER_SITE_OTHER): Payer: 59 | Admitting: Advanced Practice Midwife

## 2018-04-01 VITALS — BP 129/69 | HR 78 | Wt 250.0 lb

## 2018-04-01 DIAGNOSIS — Z3483 Encounter for supervision of other normal pregnancy, third trimester: Secondary | ICD-10-CM

## 2018-04-01 DIAGNOSIS — Z1389 Encounter for screening for other disorder: Secondary | ICD-10-CM

## 2018-04-01 DIAGNOSIS — O26899 Other specified pregnancy related conditions, unspecified trimester: Secondary | ICD-10-CM

## 2018-04-01 DIAGNOSIS — Z331 Pregnant state, incidental: Secondary | ICD-10-CM

## 2018-04-01 DIAGNOSIS — R197 Diarrhea, unspecified: Secondary | ICD-10-CM

## 2018-04-01 DIAGNOSIS — Z3A30 30 weeks gestation of pregnancy: Secondary | ICD-10-CM

## 2018-04-01 LAB — POCT URINALYSIS DIPSTICK
Blood, UA: NEGATIVE
GLUCOSE UA: NEGATIVE
Ketones, UA: NEGATIVE
LEUKOCYTES UA: NEGATIVE
NITRITE UA: NEGATIVE
PROTEIN UA: NEGATIVE

## 2018-04-01 NOTE — Progress Notes (Signed)
  G2P0010 [redacted]w[redacted]d Estimated Date of Delivery: 06/04/18  Blood pressure 129/69, pulse 78, weight 250 lb (113.4 kg), last menstrual period 08/28/2017.   BP weight and urine results all reviewed and noted.  Please refer to the obstetrical flow sheet for the fundal height and fetal heart rate documentation:  Patient reports good fetal movement, denies any bleeding and no rupture of membranes symptoms or regular contractions. Patient c/o diarrhea 5-6x/day for 5 days, watery sometimes/loose others.  No upper GI sx. Feels weak, pressure All questions were answered. Discussed weight gain; pt ok w/gaining extra weight.    Physical Assessment:   Vitals:   04/01/18 1001  BP: 129/69  Pulse: 78  Weight: 250 lb (113.4 kg)  Body mass index is 34.87 kg/m.        Physical Examination:   General appearance: Well appearing, and in no distress  Mental status: Alert, oriented to person, place, and time  Skin: Warm & dry  Cardiovascular: Normal heart rate noted  Respiratory: Normal respiratory effort, no distress  Abdomen: Soft, gravid, nontender  Pelvic: Cervical exam performed  Dilation: Closed Effacement (%): Thick Station: Ballotable  Extremities: Edema: Trace  Fetal Status: Fetal Heart Rate (bpm): 149 Fundal Height: 31 cm Movement: Present    Results for orders placed or performed in visit on 04/01/18 (from the past 24 hour(s))  POCT Urinalysis Dipstick   Collection Time: 04/01/18 10:04 AM  Result Value Ref Range   Color, UA     Clarity, UA     Glucose, UA Negative Negative   Bilirubin, UA     Ketones, UA neg    Spec Grav, UA  1.010 - 1.025   Blood, UA neg    pH, UA  5.0 - 8.0   Protein, UA Negative Negative   Urobilinogen, UA  0.2 or 1.0 E.U./dL   Nitrite, UA neg    Leukocytes, UA Negative Negative   Appearance     Odor       Orders Placed This Encounter  Procedures  . Stool Culture  . Ova and parasite examination  . POCT Urinalysis Dipstick    Plan:  Continued routine  obstetrical care, stool culture/imodium  Return in about 2 weeks (around 04/15/2018) for [place].

## 2018-04-01 NOTE — Patient Instructions (Signed)
Debbie Santana, I greatly value your feedback.  If you receive a survey following your visit with [place] today, we appreciate you taking the time to fill it out.  Thanks, Nigel Berthold, CNM   Call the office [phone]) or go to [facility] if:  You begin to have strong, frequent contractions  Your water breaks.  Sometimes it is a big gush of fluid, sometimes it is just a trickle that keeps getting your panties wet or running down your legs  You have vaginal bleeding.  It is normal to have a small amount of spotting if your cervix was checked.   You don't feel your baby moving like normal.  If you don't, get you something to eat and drink and lay down and focus on feeling your baby move.  You should feel at least 10 movements in 2 hours.  If you don't, you should call the office or go to [facility].    Tdap Vaccine  It is recommended that you get the Tdap vaccine during the third trimester of EACH pregnancy to help protect your baby from getting pertussis (whooping cough)  27-36 weeks is the BEST time to do this so that you can pass the protection on to your baby. During pregnancy is better than after pregnancy, but if you are unable to get it during pregnancy it will be offered at the hospital.   You will be offered this vaccine in the office after 27 weeks. If you do not have health insurance, you can get this vaccine at the health department or your family doctor  Everyone who will be around your baby should also be up-to-date on their vaccines. Adults (who are not pregnant) only need 1 dose of Tdap during adulthood.   Third Trimester of Pregnancy The third trimester is from week 29 through week 42, months 7 through 9. The third trimester is a time when the fetus is growing rapidly. At the end of the ninth month, the fetus is about 20 inches in length and weighs 6-10 pounds.  BODY CHANGES Your body goes through many changes during pregnancy. The changes vary from woman to  woman.   Your weight will continue to increase. You can expect to gain 25-35 pounds (11-16 kg) by the end of the pregnancy.  You may begin to get stretch marks on your hips, abdomen, and breasts.  You may urinate more often because the fetus is moving lower into your pelvis and pressing on your bladder.  You may develop or continue to have heartburn as a result of your pregnancy.  You may develop constipation because certain hormones are causing the muscles that push waste through your intestines to slow down.  You may develop hemorrhoids or swollen, bulging veins (varicose veins).  You may have pelvic pain because of the weight gain and pregnancy hormones relaxing your joints between the bones in your pelvis. Backaches may result from overexertion of the muscles supporting your posture.  You may have changes in your hair. These can include thickening of your hair, rapid growth, and changes in texture. Some women also have hair loss during or after pregnancy, or hair that feels dry or thin. Your hair will most likely return to normal after your baby is born.  Your breasts will continue to grow and be tender. A yellow discharge may leak from your breasts called colostrum.  Your belly button may stick out.  You may feel short of breath because of your expanding uterus.  You may notice the fetus "dropping," or moving lower in your abdomen.  You may have a bloody mucus discharge. This usually occurs a few days to a week before labor begins.  Your cervix becomes thin and soft (effaced) near your due date. WHAT TO EXPECT AT YOUR PRENATAL EXAMS  You will have prenatal exams every 2 weeks until week 36. Then, you will have weekly prenatal exams. During a routine prenatal visit:  You will be weighed to make sure you and the fetus are growing normally.  Your blood pressure is taken.  Your abdomen will be measured to track your baby's growth.  The fetal heartbeat will be listened  to.  Any test results from the previous visit will be discussed.  You may have a cervical check near your due date to see if you have effaced. At around 36 weeks, your caregiver will check your cervix. At the same time, your caregiver will also perform a test on the secretions of the vaginal tissue. This test is to determine if a type of bacteria, Group B streptococcus, is present. Your caregiver will explain this further. Your caregiver may ask you:  What your birth plan is.  How you are feeling.  If you are feeling the baby move.  If you have had any abnormal symptoms, such as leaking fluid, bleeding, severe headaches, or abdominal cramping.  If you have any questions. Other tests or screenings that may be performed during your third trimester include:  Blood tests that check for low iron levels (anemia).  Fetal testing to check the health, activity level, and growth of the fetus. Testing is done if you have certain medical conditions or if there are problems during the pregnancy. FALSE LABOR You may feel small, irregular contractions that eventually go away. These are called Braxton Hicks contractions, or false labor. Contractions may last for hours, days, or even weeks before true labor sets in. If contractions come at regular intervals, intensify, or become painful, it is best to be seen by your caregiver.  SIGNS OF LABOR   Menstrual-like cramps.  Contractions that are 5 minutes apart or less.  Contractions that start on the top of the uterus and spread down to the lower abdomen and back.  A sense of increased pelvic pressure or back pain.  A watery or bloody mucus discharge that comes from the vagina. If you have any of these signs before the 37th week of pregnancy, call your caregiver right away. You need to go to the hospital to get checked immediately. HOME CARE INSTRUCTIONS   Avoid all smoking, herbs, alcohol, and unprescribed drugs. These chemicals affect the  formation and growth of the baby.  Follow your caregiver's instructions regarding medicine use. There are medicines that are either safe or unsafe to take during pregnancy.  Exercise only as directed by your caregiver. Experiencing uterine cramps is a good sign to stop exercising.  Continue to eat regular, healthy meals.  Wear a good support bra for breast tenderness.  Do not use hot tubs, steam rooms, or saunas.  Wear your seat belt at all times when driving.  Avoid raw meat, uncooked cheese, cat litter boxes, and soil used by cats. These carry germs that can cause birth defects in the baby.  Take your prenatal vitamins.  Try taking a stool softener (if your caregiver approves) if you develop constipation. Eat more high-fiber foods, such as fresh vegetables or fruit and whole grains. Drink plenty of fluids to keep your urine  clear or pale yellow.  Take warm sitz baths to soothe any pain or discomfort caused by hemorrhoids. Use hemorrhoid cream if your caregiver approves.  If you develop varicose veins, wear support hose. Elevate your feet for 15 minutes, 3-4 times a day. Limit salt in your diet.  Avoid heavy lifting, wear low heal shoes, and practice good posture.  Rest a lot with your legs elevated if you have leg cramps or low back pain.  Visit your dentist if you have not gone during your pregnancy. Use a soft toothbrush to brush your teeth and be gentle when you floss.  A sexual relationship may be continued unless your caregiver directs you otherwise.  Do not travel far distances unless it is absolutely necessary and only with the approval of your caregiver.  Take prenatal classes to understand, practice, and ask questions about the labor and delivery.  Make a trial run to the hospital.  Pack your hospital bag.  Prepare the baby's nursery.  Continue to go to all your prenatal visits as directed by your caregiver. SEEK MEDICAL CARE IF:  You are unsure if you are in  labor or if your water has broken.  You have dizziness.  You have mild pelvic cramps, pelvic pressure, or nagging pain in your abdominal area.  You have persistent nausea, vomiting, or diarrhea.  You have a bad smelling vaginal discharge.  You have pain with urination. SEEK IMMEDIATE MEDICAL CARE IF:   You have a fever.  You are leaking fluid from your vagina.  You have spotting or bleeding from your vagina.  You have severe abdominal cramping or pain.  You have rapid weight loss or gain.  You have shortness of breath with chest pain.  You notice sudden or extreme swelling of your face, hands, ankles, feet, or legs.  You have not felt your baby move in over an hour.  You have severe headaches that do not go away with medicine.  You have vision changes. Document Released: 09/03/2001 Document Revised: 09/14/2013 Document Reviewed: 11/10/2012 [facility] Patient Information 2015 [place], [place]. This information is not intended to replace advice given to you by your health care provider. Make sure you discuss any questions you have with your health care provider.

## 2018-04-15 ENCOUNTER — Ambulatory Visit (INDEPENDENT_AMBULATORY_CARE_PROVIDER_SITE_OTHER): Payer: 59 | Admitting: Women's Health

## 2018-04-15 ENCOUNTER — Encounter: Payer: Self-pay | Admitting: Women's Health

## 2018-04-15 VITALS — BP 130/84 | HR 116 | Wt 254.0 lb

## 2018-04-15 DIAGNOSIS — Z23 Encounter for immunization: Secondary | ICD-10-CM | POA: Diagnosis not present

## 2018-04-15 DIAGNOSIS — Z3A32 32 weeks gestation of pregnancy: Secondary | ICD-10-CM

## 2018-04-15 DIAGNOSIS — R102 Pelvic and perineal pain: Secondary | ICD-10-CM

## 2018-04-15 DIAGNOSIS — O9989 Other specified diseases and conditions complicating pregnancy, childbirth and the puerperium: Secondary | ICD-10-CM

## 2018-04-15 DIAGNOSIS — Z1389 Encounter for screening for other disorder: Secondary | ICD-10-CM

## 2018-04-15 DIAGNOSIS — Z331 Pregnant state, incidental: Secondary | ICD-10-CM

## 2018-04-15 DIAGNOSIS — Z3483 Encounter for supervision of other normal pregnancy, third trimester: Secondary | ICD-10-CM

## 2018-04-15 DIAGNOSIS — A630 Anogenital (venereal) warts: Secondary | ICD-10-CM

## 2018-04-15 LAB — POCT URINALYSIS DIPSTICK OB
Glucose, UA: NEGATIVE — AB
KETONES UA: NEGATIVE
Nitrite, UA: NEGATIVE
POC,PROTEIN,UA: NEGATIVE

## 2018-04-15 NOTE — Progress Notes (Signed)
   LOW-RISK PREGNANCY VISIT Patient name: Debbie Santana MRN [id number]  Date of birth: [birth year] Chief Complaint:   Routine Prenatal Visit (+ pelvic pain)  History of Present Illness:   Debbie Santana is a 27 y.o. G2P0010 female at [redacted]w[redacted]d with an Estimated Date of Delivery: 06/04/18 being seen today for ongoing management of a low-risk pregnancy.  Today she reports pressure, mucous discharge. Contractions: Irregular. Vag. Bleeding: None.  Movement: Present. denies leaking of fluid. Review of Systems:   Pertinent items are noted in HPI Denies abnormal vaginal discharge w/ itching/odor/irritation, headaches, visual changes, shortness of breath, chest pain, abdominal pain, severe nausea/vomiting, or problems with urination or bowel movements unless otherwise stated above. Pertinent History Reviewed:  Reviewed past medical,surgical, social, obstetrical and family history.  Reviewed problem list, medications and allergies. Physical Assessment:   Vitals:   04/15/18 1139  BP: 130/84  Pulse: (!) 116  Weight: 254 lb (115.2 kg)  Body mass index is 35.43 kg/m.        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 Discussed w/ JVF, doesn't need to view cx today . Few vaginal conyloma         Extremities: Edema: Trace  Fetal Status: Fetal Heart Rate (bpm): 150 Fundal Height: 32 cm Movement: Present    Results for orders placed or performed in visit on 04/15/18 (from the past 24 hour(s))  POC Urinalysis Dipstick OB   Collection Time: 04/15/18 11:40 AM    Result Value Ref Range   Color, UA     Clarity, UA     Glucose, UA Negative (A) (none)   Bilirubin, UA     Ketones, UA neg    Spec Grav, UA  1.010 - 1.025   Blood, UA trace    pH, UA  5.0 - 8.0   POC Protein UA Negative Negative, Trace   Urobilinogen, UA  0.2 or 1.0 E.U./dL   Nitrite, UA neg    Leukocytes, UA Trace (A) Negative   Appearance     Odor      Assessment & Plan:  1) Low-risk pregnancy G2P0010 at [redacted]w[redacted]d with an Estimated Date of Delivery: 06/04/18   2) Vulvovaginal and perianal condyloma, 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  Labs/procedures today: tdap  Plan:  Continue routine obstetrical care   Reviewed: Preterm labor symptoms and general obstetric precautions including but not limited to vaginal bleeding, contractions, leaking of fluid and fetal movement were reviewed in detail with the patient.  All questions were answered  Follow-up: Return in about 2 weeks (around 04/29/2018) for LROB.  Orders Placed This Encounter  Procedures  . Tdap vaccine greater than or equal to 7yo IM  . POC Urinalysis Dipstick OB   Roma Schanz CNM, [facility] 04/15/2018 12:19 PM

## 2018-04-15 NOTE — Patient Instructions (Signed)
Debbie Santana, I greatly value your feedback.  If you receive a survey following your visit with [place] today, we appreciate you taking the time to fill it out.  Thanks, Debbie Santana, CNM, WHNP-BC   Call the office [phone]) or go to [facility] if:  You begin to have strong, frequent contractions  Your water breaks.  Sometimes it is a big gush of fluid, sometimes it is just a trickle that keeps getting your panties wet or running down your legs  You have vaginal bleeding.  It is normal to have a small amount of spotting if your cervix was checked.   You don't feel your baby moving like normal.  If you don't, get you something to eat and drink and lay down and focus on feeling your baby move.  You should feel at least 10 movements in 2 hours.  If you don't, you should call the office or go to [place] and Birth Information The normal length of a pregnancy is 39-41 weeks. Preterm labor is when labor starts before 37 completed weeks of pregnancy. What are the risk factors for preterm labor? Preterm labor is more likely to occur in women who:  Have certain infections during pregnancy such as a bladder infection, sexually transmitted infection, or infection inside the uterus (chorioamnionitis).  Have a shorter-than-normal cervix.  Have gone into preterm labor before.  Have had surgery on their cervix.  Are younger than age 29 or older than age 3.  Are African American.  Are pregnant with twins or multiple babies (multiple gestation).  Take street drugs or smoke while pregnant.  Do not gain enough weight while pregnant.  Became pregnant shortly after having been pregnant.  What are the symptoms of preterm labor? Symptoms of preterm labor include:  Cramps similar to those that can happen during a menstrual period. The cramps may happen with diarrhea.  Pain in the abdomen or lower back.  Regular uterine contractions that may feel like tightening of  the abdomen.  A feeling of increased pressure in the pelvis.  Increased watery or bloody mucus discharge from the vagina.  Water breaking (ruptured amniotic sac).  Why is it important to recognize signs of preterm labor? It is important to recognize signs of preterm labor because babies who are born prematurely may not be fully developed. This can put them at an increased risk for:  Long-term (chronic) heart and lung problems.  Difficulty immediately after birth with regulating body systems, including blood sugar, body temperature, heart rate, and breathing rate.  Bleeding in the brain.  Cerebral palsy.  Learning difficulties.  Death.  These risks are highest for babies who are born before 1 weeks of pregnancy. How is preterm labor treated? Treatment depends on the length of your pregnancy, your condition, and the health of your baby. It may involve:  Having a stitch (suture) placed in your cervix to prevent your cervix from opening too early (cerclage).  Taking or being given medicines, such as: ? Hormone medicines. These may be given early in pregnancy to help support the pregnancy. ? Medicine to stop contractions. ? Medicines to help mature the baby's lungs. These may be prescribed if the risk of delivery is high. ? Medicines to prevent your baby from developing cerebral palsy.  If the labor happens before 34 weeks of pregnancy, you may need to stay in the hospital. What should I do if I think I am in preterm labor? If  you think that you are going into preterm labor, call your health care provider right away. How can I prevent preterm labor in future pregnancies? To increase your chance of having a full-term pregnancy:  Do not use any tobacco products, such as cigarettes, chewing tobacco, and e-cigarettes. If you need help quitting, ask your health care provider.  Do not use street drugs or medicines that have not been prescribed to you during your pregnancy.  Talk  with your health care provider before taking any herbal supplements, even if you have been taking them regularly.  Make sure you gain a healthy amount of weight during your pregnancy.  Watch for infection. If you think that you might have an infection, get it checked right away.  Make sure to tell your health care provider if you have gone into preterm labor before.  This information is not intended to replace advice given to you by your health care provider. Make sure you discuss any questions you have with your health care provider. Document Released: 11/30/2003 Document Revised: 02/20/2016 Document Reviewed: 01/31/2016 Elsevier Interactive Patient Education  2018 [employer].

## 2018-04-22 ENCOUNTER — Telehealth: Payer: Self-pay | Admitting: *Deleted

## 2018-04-22 ENCOUNTER — Other Ambulatory Visit: Payer: Self-pay

## 2018-04-22 ENCOUNTER — Other Ambulatory Visit (INDEPENDENT_AMBULATORY_CARE_PROVIDER_SITE_OTHER): Payer: 59

## 2018-04-22 VITALS — BP 139/85 | HR 133 | Wt 252.0 lb

## 2018-04-22 DIAGNOSIS — Z1389 Encounter for screening for other disorder: Secondary | ICD-10-CM

## 2018-04-22 DIAGNOSIS — Z331 Pregnant state, incidental: Secondary | ICD-10-CM

## 2018-04-22 DIAGNOSIS — R319 Hematuria, unspecified: Secondary | ICD-10-CM

## 2018-04-22 LAB — POCT URINALYSIS DIPSTICK OB
Glucose, UA: NEGATIVE — AB
Leukocytes, UA: NEGATIVE
NITRITE UA: NEGATIVE

## 2018-04-22 NOTE — Telephone Encounter (Signed)
Patient thinks she has UTI. Frequency and pain.  Pt to come leave urine for dip. Will come after 1:30.

## 2018-04-24 LAB — URINE CULTURE: Organism ID, Bacteria: NO GROWTH

## 2018-04-29 ENCOUNTER — Ambulatory Visit (INDEPENDENT_AMBULATORY_CARE_PROVIDER_SITE_OTHER): Payer: 59 | Admitting: Obstetrics and Gynecology

## 2018-04-29 VITALS — BP 131/83 | HR 123 | Wt 253.8 lb

## 2018-04-29 DIAGNOSIS — Z1389 Encounter for screening for other disorder: Secondary | ICD-10-CM

## 2018-04-29 DIAGNOSIS — Z3A34 34 weeks gestation of pregnancy: Secondary | ICD-10-CM

## 2018-04-29 DIAGNOSIS — Z331 Pregnant state, incidental: Secondary | ICD-10-CM

## 2018-04-29 DIAGNOSIS — Z3483 Encounter for supervision of other normal pregnancy, third trimester: Secondary | ICD-10-CM

## 2018-04-29 LAB — POCT URINALYSIS DIPSTICK OB
GLUCOSE, UA: NEGATIVE — AB
Ketones, UA: NEGATIVE
Leukocytes, UA: NEGATIVE
Nitrite, UA: NEGATIVE
POC,PROTEIN,UA: NEGATIVE
RBC UA: NEGATIVE

## 2018-04-29 NOTE — Progress Notes (Signed)
Patient ID: BRENNLEY CURTICE, female   DOB: [birth year], 27 y.o.   MRN: [id number]    LOW-RISK PREGNANCY VISIT Patient name: RAYN SHORB MRN [id number]  Date of birth: Aug 24, 1991 Chief Complaint:   Routine Prenatal Visit  History of Present Illness:   ELYNOR KALLENBERGER is a 27 y.o. G2P0010 female at [redacted]w[redacted]d with an Estimated Date of Delivery: 06/04/18 being seen today for ongoing management of a low-risk pregnancy. Patient had previous condyloma as well as hx of abnormal pap in 2015,2016.  This is not her partners first child. She has not signed up for any child birthing classes nor has watched any videos. Today she reports no complaints. Contractions: Irregular. Vag. Bleeding: None.  Movement: Present. denies leaking of fluid. Review of Systems:   Pertinent items are noted in HPI Denies abnormal vaginal discharge w/ itching/odor/irritation, headaches, visual changes, shortness of breath, chest pain, abdominal pain, severe nausea/vomiting, or problems with urination or bowel movements unless otherwise stated above. Pertinent History Reviewed:  Reviewed past medical,surgical, social, obstetrical and family history.  Reviewed problem list, medications and allergies. Physical Assessment:   Vitals:   04/29/18 1146  BP: 131/83  Pulse: (!) 123  Weight: 253 lb 12.8 oz (115.1 kg)  Body mass index is 35.4 kg/m.        Physical Examination:   General appearance: Well appearing, and in no distress  Mental status: Alert, oriented to person, place, and time  Skin: Warm & dry  Cardiovascular: Normal heart rate noted  Respiratory: Normal respiratory effort, no distress  Abdomen: Soft, gravid, nontender  Pelvic: Cervical exam deferred         Extremities: Edema: Trace  Fetal Status: Fetal Heart Rate (bpm): 154 Fundal Height: 38 cm Movement: Present    Results for orders placed or performed in visit on 04/29/18 (from the past 24 hour(s))  POC Urinalysis Dipstick OB   Collection Time: 04/29/18 11:48 AM   Result Value Ref Range   Color, UA     Clarity, UA     Glucose, UA Negative (A) (none)   Bilirubin, UA     Ketones, UA neg    Spec Grav, UA  1.010 - 1.025   Blood, UA neg    pH, UA  5.0 - 8.0   POC Protein UA Negative Negative, Trace   Urobilinogen, UA  0.2 or 1.0 E.U./dL   Nitrite, UA neg    Leukocytes, UA Negative Negative   Appearance     Odor      Assessment & Plan:  1) Low-risk pregnancy G2P0010 at [redacted]w[redacted]d with an Estimated Date of Delivery: 06/04/18  2.  No prenatal education to date   Meds: No orders of the defined types were placed in this encounter.  Labs/procedures today: None  Plan:  Continue routine obstetrical care  Look into child birthing classes if available/ child birthing videos, given information brochure   Follow-up: Return in about 1 year (around 04/30/2019).  Orders Placed This Encounter  Procedures  . POC Urinalysis Dipstick OB   By signing my name below, I, Samul Dada, attest that this documentation has been prepared under the direction and in the presence of Jonnie Kind, MD. Electronically Signed: [place]. 04/29/18. 12:07 PM.  I personally performed the services described in this documentation, which was SCRIBED in my presence. The recorded information has been reviewed and considered accurate. It has been edited as necessary during review. Jonnie Kind, MD

## 2018-05-13 ENCOUNTER — Ambulatory Visit (INDEPENDENT_AMBULATORY_CARE_PROVIDER_SITE_OTHER): Payer: Medicaid Other | Admitting: Advanced Practice Midwife

## 2018-05-13 ENCOUNTER — Encounter: Payer: Self-pay | Admitting: Advanced Practice Midwife

## 2018-05-13 VITALS — BP 122/82 | HR 111 | Wt 261.0 lb

## 2018-05-13 DIAGNOSIS — Z3A36 36 weeks gestation of pregnancy: Secondary | ICD-10-CM

## 2018-05-13 DIAGNOSIS — Z1389 Encounter for screening for other disorder: Secondary | ICD-10-CM

## 2018-05-13 DIAGNOSIS — Z3483 Encounter for supervision of other normal pregnancy, third trimester: Secondary | ICD-10-CM | POA: Diagnosis not present

## 2018-05-13 DIAGNOSIS — Z331 Pregnant state, incidental: Secondary | ICD-10-CM

## 2018-05-13 LAB — POCT URINALYSIS DIPSTICK OB
Glucose, UA: NEGATIVE — AB
KETONES UA: NEGATIVE
NITRITE UA: NEGATIVE

## 2018-05-13 MED ORDER — ACYCLOVIR 400 MG PO TABS: 400.0000 mg | ORAL_TABLET | Freq: Three times a day (TID) | ORAL | 2 refills | Status: DC

## 2018-05-13 NOTE — Patient Instructions (Signed)
Cervical Ripening: May try one or both  Red Raspberry Leaf capsules: two 300mg  or 400mg  tablets with each meal, 2-3 times a day  Potential Side Effects Of Raspberry Leaf:  Most women do not experience any side effects from drinking raspberry leaf tea. However, nausea and loose stools are possible   Evening Primrose Oil capsules: 3 500 mg capsules daily.  Some of the potential side effects:  Upset stomach  Loose stools or diarrhea  Headaches  Nausea:

## 2018-05-13 NOTE — Progress Notes (Signed)
  G2P0010 [redacted]w[redacted]d Estimated Date of Delivery: 06/04/18  Blood pressure 122/82, pulse (!) 111, weight 261 lb (118.4 kg), last menstrual period 08/28/2017.   BP weight and urine results all reviewed and noted.  Please refer to the obstetrical flow sheet for the fundal height and fetal heart rate documentation:  Patient reports good fetal movement, denies any bleeding and no rupture of membranes symptoms or regular contractions. Patient is without complaints. All questions were answered.   Physical Assessment:   Vitals:   05/13/18 1159  BP: 122/82  Pulse: (!) 111  Weight: 261 lb (118.4 kg)  Body mass index is 36.4 kg/m.        Physical Examination:   General appearance: Well appearing, and in no distress  Mental status: Alert, oriented to person, place, and time  Skin: Warm & dry  Cardiovascular: Normal heart rate noted  Respiratory: Normal respiratory effort, no distress  Abdomen: Soft, gravid, nontender  Pelvic: Cervical exam performed  Dilation: 3 Effacement (%): 50 Station: -1  Extremities: Edema: Mild pitting, slight indentation  Fetal Status: Fetal Heart Rate (bpm): 153 Fundal Height: 38 cm Movement: Present Presentation: Vertex  Results for orders placed or performed in visit on 05/13/18 (from the past 24 hour(s))  POC Urinalysis Dipstick OB   Collection Time: 05/13/18 12:01 PM  Result Value Ref Range   Color, UA     Clarity, UA     Glucose, UA Negative (A) (none)   Bilirubin, UA     Ketones, UA neg    Spec Grav, UA     Blood, UA trace    pH, UA     POC Protein UA Small (1+) (A) Negative, Trace   Urobilinogen, UA     Nitrite, UA neg    Leukocytes, UA Trace (A) Negative   Appearance     Odor       Orders Placed This Encounter  Procedures  . GC/Chlamydia Probe Amp  . Strep Gp B NAA  . POC Urinalysis Dipstick OB    Plan:  Continued routine obstetrical care,   Return in about 1 week (around 05/20/2018) for LROB.

## 2018-05-15 ENCOUNTER — Inpatient Hospital Stay (HOSPITAL_COMMUNITY): Payer: Medicaid Other | Admitting: Anesthesiology

## 2018-05-15 ENCOUNTER — Inpatient Hospital Stay (HOSPITAL_COMMUNITY)
Admission: AD | Admit: 2018-05-15 | Discharge: 2018-05-17 | DRG: 807 | Disposition: A | Discharge status: Home / Self Care | Payer: Medicaid Other | Attending: Obstetrics & Gynecology | Admitting: Obstetrics & Gynecology

## 2018-05-15 ENCOUNTER — Encounter (HOSPITAL_COMMUNITY): Payer: Self-pay

## 2018-05-15 DIAGNOSIS — F17210 Nicotine dependence, cigarettes, uncomplicated: Secondary | ICD-10-CM | POA: Diagnosis present

## 2018-05-15 DIAGNOSIS — O99334 Smoking (tobacco) complicating childbirth: Secondary | ICD-10-CM | POA: Diagnosis present

## 2018-05-15 DIAGNOSIS — Z3A37 37 weeks gestation of pregnancy: Secondary | ICD-10-CM | POA: Diagnosis not present

## 2018-05-15 DIAGNOSIS — O4292 Full-term premature rupture of membranes, unspecified as to length of time between rupture and onset of labor: Principal | ICD-10-CM | POA: Diagnosis present

## 2018-05-15 DIAGNOSIS — Z3483 Encounter for supervision of other normal pregnancy, third trimester: Secondary | ICD-10-CM

## 2018-05-15 DIAGNOSIS — O99824 Streptococcus B carrier state complicating childbirth: Secondary | ICD-10-CM | POA: Diagnosis present

## 2018-05-15 DIAGNOSIS — O134 Gestational [pregnancy-induced] hypertension without significant proteinuria, complicating childbirth: Secondary | ICD-10-CM | POA: Diagnosis not present

## 2018-05-15 DIAGNOSIS — Z789 Other specified health status: Secondary | ICD-10-CM | POA: Diagnosis present

## 2018-05-15 DIAGNOSIS — Z8742 Personal history of other diseases of the female genital tract: Secondary | ICD-10-CM

## 2018-05-15 DIAGNOSIS — O4202 Full-term premature rupture of membranes, onset of labor within 24 hours of rupture: Secondary | ICD-10-CM | POA: Diagnosis not present

## 2018-05-15 LAB — COMPREHENSIVE METABOLIC PANEL
ALBUMIN: 2.5 g/dL — AB (ref 3.5–5.0)
ALK PHOS: 95 U/L (ref 38–126)
ALT: 12 U/L (ref 0–44)
ANION GAP: 9 (ref 5–15)
AST: 15 U/L (ref 15–41)
BUN: 6 mg/dL (ref 6–20)
CHLORIDE: 106 mmol/L (ref 98–111)
CO2: 19 mmol/L — AB (ref 22–32)
Calcium: 9.3 mg/dL (ref 8.9–10.3)
Creatinine, Ser: 0.65 mg/dL (ref 0.44–1.00)
GFR calc non Af Amer: >60 mL/min (ref >60)
GLUCOSE: 93 mg/dL (ref 70–99)
Potassium: 3.9 mmol/L (ref 3.5–5.1)
SODIUM: 134 mmol/L — AB (ref 135–145)
Total Bilirubin: 0.5 mg/dL (ref 0.3–1.2)
Total Protein: 5.3 g/dL — ABNORMAL LOW (ref 6.5–8.1)

## 2018-05-15 LAB — TYPE AND SCREEN
ABO/RH(D): A POS
Antibody Screen: NEGATIVE

## 2018-05-15 LAB — CBC
HCT: 35.9 % — ABNORMAL LOW (ref 36.0–46.0)
HEMOGLOBIN: 12.5 g/dL (ref 12.0–15.0)
MCH: 33 pg (ref 26.0–34.0)
MCHC: 34.8 g/dL (ref 30.0–36.0)
MCV: 94.7 fL (ref 78.0–100.0)
Platelets: 200 10*3/uL (ref 150–400)
RBC: 3.79 MIL/uL — AB (ref 3.87–5.11)
RDW: 13.4 % (ref 11.5–15.5)
WBC: 14.5 10*3/uL — AB (ref 4.0–10.5)

## 2018-05-15 LAB — GROUP B STREP BY PCR: GROUP B STREP BY PCR: POSITIVE — AB

## 2018-05-15 LAB — GC/CHLAMYDIA PROBE AMP
CHLAMYDIA, DNA PROBE: NEGATIVE
Neisseria gonorrhoeae by PCR: NEGATIVE

## 2018-05-15 LAB — ABO/RH: ABO/RH(D): A POS

## 2018-05-15 LAB — PROTEIN / CREATININE RATIO, URINE
Creatinine, Urine: 69 mg/dL
PROTEIN CREATININE RATIO: 0.17 mg/mg{creat} — AB (ref 0.00–0.15)
Total Protein, Urine: 12 mg/dL

## 2018-05-15 LAB — STREP GP B NAA: Strep Gp B NAA: POSITIVE — AB

## 2018-05-15 MED ORDER — EPHEDRINE 5 MG/ML INJ
10.0000 mg | INTRAVENOUS | Status: DC | PRN
  Filled 2018-05-15: qty 2

## 2018-05-15 MED ORDER — COCONUT OIL OIL: 1.0000 "application " | TOPICAL_OIL | Status: DC | PRN

## 2018-05-15 MED ORDER — FENTANYL 2.5 MCG/ML BUPIVACAINE 1/10 % EPIDURAL INFUSION (WH - ANES)
14.0000 mL/h | INTRAMUSCULAR | Status: DC | PRN
  Administered 2018-05-15: 14 mL/h via EPIDURAL
  Filled 2018-05-15: qty 100

## 2018-05-15 MED ORDER — BENZOCAINE-MENTHOL 20-0.5 % EX AERO
1.0000 "application " | INHALATION_SPRAY | CUTANEOUS | Status: DC | PRN
  Administered 2018-05-15: 1 via TOPICAL
  Filled 2018-05-15: qty 56

## 2018-05-15 MED ORDER — LIDOCAINE HCL (PF) 1 % IJ SOLN
30.0000 mL | INTRAMUSCULAR | Status: DC | PRN
  Filled 2018-05-15: qty 30

## 2018-05-15 MED ORDER — DIBUCAINE 1 % RE OINT: 1.0000 "application " | TOPICAL_OINTMENT | RECTAL | Status: DC | PRN

## 2018-05-15 MED ORDER — ONDANSETRON HCL 4 MG/2ML IJ SOLN: 4.0000 mg | INTRAMUSCULAR | Status: DC | PRN

## 2018-05-15 MED ORDER — OXYCODONE-ACETAMINOPHEN 5-325 MG PO TABS: 2.0000 | ORAL_TABLET | ORAL | Status: DC | PRN

## 2018-05-15 MED ORDER — TETANUS-DIPHTH-ACELL PERTUSSIS 5-2.5-18.5 LF-MCG/0.5 IM SUSP: 0.5000 mL | Freq: Once | INTRAMUSCULAR | Status: DC

## 2018-05-15 MED ORDER — OXYTOCIN BOLUS FROM INFUSION
500.0000 mL | Freq: Once | INTRAVENOUS | Status: AC
  Administered 2018-05-15: 500 mL via INTRAVENOUS

## 2018-05-15 MED ORDER — ACYCLOVIR 400 MG PO TABS
400.0000 mg | ORAL_TABLET | Freq: Three times a day (TID) | ORAL | Status: DC
  Administered 2018-05-16 – 2018-05-17 (×5): 400 mg via ORAL
  Filled 2018-05-15 (×5): qty 1

## 2018-05-15 MED ORDER — PENICILLIN G 3 MILLION UNITS IVPB - SIMPLE MED
3.0000 10*6.[IU] | INTRAVENOUS | Status: DC
  Administered 2018-05-15 (×2): 3 10*6.[IU] via INTRAVENOUS
  Filled 2018-05-15: qty 3
  Filled 2018-05-15 (×2): qty 100
  Filled 2018-05-15: qty 3

## 2018-05-15 MED ORDER — MEASLES, MUMPS & RUBELLA VAC ~~LOC~~ INJ: 0.5000 mL | INJECTION | Freq: Once | SUBCUTANEOUS | Status: DC

## 2018-05-15 MED ORDER — WITCH HAZEL-GLYCERIN EX PADS: 1.0000 "application " | MEDICATED_PAD | CUTANEOUS | Status: DC | PRN

## 2018-05-15 MED ORDER — PHENYLEPHRINE 40 MCG/ML (10ML) SYRINGE FOR IV PUSH (FOR BLOOD PRESSURE SUPPORT)
80.0000 ug | PREFILLED_SYRINGE | INTRAVENOUS | Status: DC | PRN
  Filled 2018-05-15: qty 5

## 2018-05-15 MED ORDER — PHENYLEPHRINE 40 MCG/ML (10ML) SYRINGE FOR IV PUSH (FOR BLOOD PRESSURE SUPPORT)
80.0000 ug | PREFILLED_SYRINGE | INTRAVENOUS | Status: DC | PRN
  Administered 2018-05-15: 80 ug via INTRAVENOUS
  Filled 2018-05-15: qty 10
  Filled 2018-05-15: qty 5

## 2018-05-15 MED ORDER — FENTANYL CITRATE (PF) 100 MCG/2ML IJ SOLN
100.0000 ug | INTRAMUSCULAR | Status: DC | PRN
  Administered 2018-05-15 (×2): 100 ug via INTRAVENOUS
  Filled 2018-05-15 (×2): qty 2

## 2018-05-15 MED ORDER — PHENYLEPHRINE 40 MCG/ML (10ML) SYRINGE FOR IV PUSH (FOR BLOOD PRESSURE SUPPORT)
PREFILLED_SYRINGE | INTRAVENOUS | Status: AC
  Filled 2018-05-15: qty 10

## 2018-05-15 MED ORDER — ONDANSETRON HCL 4 MG/2ML IJ SOLN
4.0000 mg | Freq: Four times a day (QID) | INTRAMUSCULAR | Status: DC | PRN
  Administered 2018-05-15: 4 mg via INTRAVENOUS
  Filled 2018-05-15: qty 2

## 2018-05-15 MED ORDER — ONDANSETRON HCL 4 MG PO TABS: 4.0000 mg | ORAL_TABLET | ORAL | Status: DC | PRN

## 2018-05-15 MED ORDER — ACETAMINOPHEN 325 MG PO TABS: 650.0000 mg | ORAL_TABLET | ORAL | Status: DC | PRN

## 2018-05-15 MED ORDER — LIDOCAINE HCL (PF) 1 % IJ SOLN
INTRAMUSCULAR | Status: DC | PRN
  Administered 2018-05-15: 7 mL via EPIDURAL
  Administered 2018-05-15: 6 mL via EPIDURAL

## 2018-05-15 MED ORDER — SODIUM CHLORIDE 0.9 % IV SOLN
5.0000 10*6.[IU] | Freq: Once | INTRAVENOUS | Status: AC
  Administered 2018-05-15: 5 10*6.[IU] via INTRAVENOUS
  Filled 2018-05-15: qty 5

## 2018-05-15 MED ORDER — SOD CITRATE-CITRIC ACID 500-334 MG/5ML PO SOLN
30.0000 mL | ORAL | Status: DC | PRN
  Administered 2018-05-15: 30 mL via ORAL
  Filled 2018-05-15: qty 15

## 2018-05-15 MED ORDER — SIMETHICONE 80 MG PO CHEW
80.0000 mg | CHEWABLE_TABLET | ORAL | Status: DC | PRN
  Administered 2018-05-16 – 2018-05-17 (×3): 80 mg via ORAL
  Filled 2018-05-15 (×3): qty 1

## 2018-05-15 MED ORDER — OXYTOCIN 40 UNITS IN LACTATED RINGERS INFUSION - SIMPLE MED
1.0000 m[IU]/min | INTRAVENOUS | Status: DC
  Administered 2018-05-15: 2 m[IU]/min via INTRAVENOUS
  Filled 2018-05-15: qty 1000

## 2018-05-15 MED ORDER — OXYCODONE-ACETAMINOPHEN 5-325 MG PO TABS: 1.0000 | ORAL_TABLET | ORAL | Status: DC | PRN

## 2018-05-15 MED ORDER — LACTATED RINGERS IV SOLN
500.0000 mL | Freq: Once | INTRAVENOUS | Status: AC
  Administered 2018-05-15: 500 mL via INTRAVENOUS

## 2018-05-15 MED ORDER — SENNOSIDES-DOCUSATE SODIUM 8.6-50 MG PO TABS
2.0000 | ORAL_TABLET | ORAL | Status: DC
  Administered 2018-05-16 (×2): 2 via ORAL
  Filled 2018-05-15 (×2): qty 2

## 2018-05-15 MED ORDER — DIPHENHYDRAMINE HCL 25 MG PO CAPS
25.0000 mg | ORAL_CAPSULE | Freq: Four times a day (QID) | ORAL | Status: DC | PRN
  Administered 2018-05-15: 25 mg via ORAL
  Filled 2018-05-15: qty 1

## 2018-05-15 MED ORDER — LACTATED RINGERS IV SOLN: 500.0000 mL | INTRAVENOUS | Status: DC | PRN

## 2018-05-15 MED ORDER — IBUPROFEN 600 MG PO TABS
600.0000 mg | ORAL_TABLET | Freq: Four times a day (QID) | ORAL | Status: DC
  Administered 2018-05-16 – 2018-05-17 (×7): 600 mg via ORAL
  Filled 2018-05-15 (×8): qty 1

## 2018-05-15 MED ORDER — FLEET ENEMA 7-19 GM/118ML RE ENEM: 1.0000 | ENEMA | RECTAL | Status: DC | PRN

## 2018-05-15 MED ORDER — PRENATAL MULTIVITAMIN CH
1.0000 | ORAL_TABLET | Freq: Every day | ORAL | Status: DC
  Administered 2018-05-16 – 2018-05-17 (×2): 1 via ORAL
  Filled 2018-05-15 (×2): qty 1

## 2018-05-15 MED ORDER — LACTATED RINGERS IV SOLN
INTRAVENOUS | Status: DC
  Administered 2018-05-15 (×2): via INTRAVENOUS

## 2018-05-15 MED ORDER — DIPHENHYDRAMINE HCL 50 MG/ML IJ SOLN
12.5000 mg | INTRAMUSCULAR | Status: DC | PRN
  Administered 2018-05-15 (×2): 12.5 mg via INTRAVENOUS
  Filled 2018-05-15 (×2): qty 1

## 2018-05-15 MED ORDER — TERBUTALINE SULFATE 1 MG/ML IJ SOLN
0.2500 mg | Freq: Once | INTRAMUSCULAR | Status: DC | PRN
  Filled 2018-05-15: qty 1

## 2018-05-15 MED ORDER — OXYTOCIN 40 UNITS IN LACTATED RINGERS INFUSION - SIMPLE MED: 2.5000 [IU]/h | INTRAVENOUS | Status: DC

## 2018-05-15 MED ORDER — ZOLPIDEM TARTRATE 5 MG PO TABS: 5.0000 mg | ORAL_TABLET | Freq: Every evening | ORAL | Status: DC | PRN

## 2018-05-15 MED ORDER — ACETAMINOPHEN 325 MG PO TABS
650.0000 mg | ORAL_TABLET | ORAL | Status: DC | PRN
  Administered 2018-05-16 – 2018-05-17 (×4): 650 mg via ORAL
  Filled 2018-05-15 (×4): qty 2

## 2018-05-15 NOTE — MAU Provider Note (Signed)
Pt informed that the ultrasound is considered a limited OB ultrasound and is not intended to be a complete ultrasound exam.  Patient also informed that the ultrasound is not being completed with the intent of assessing for fetal or placental anomalies or any pelvic abnormalities.   Explained that the purpose of today's ultrasound is to assess for  presentation.  Patient acknowledges the purpose of the exam and the limitations of the study.     Study performed by me: presentation is cephalic

## 2018-05-15 NOTE — Progress Notes (Signed)
LABOR PROGRESS NOTE  Debbie Santana is a 27 y.o. G2P0010 at [redacted]w[redacted]d  admitted for PROM.   Subjective: Patient reports feeling ctx about q4-5 min. They have somewhat increased in severity. She wishes to have an epidural.   Objective: BP (!) 144/83 (BP Location: Right Arm)   Pulse (!) 114   Temp 98.3 F (36.8 C) (Oral)   Resp 18   Ht 5\' 9"  (1.753 m)   Wt 118.3 kg   LMP 08/28/2017 (Exact Date)   BMI 38.51 kg/m  or  Vitals:   05/15/18 0736 05/15/18 0953 05/15/18 1027 05/15/18 1135  BP: 130/78 (!) 144/83  (!) 144/83  Pulse: 87 88  (!) 114  Resp: 18   18  Temp:   98.3 F (36.8 C)   TempSrc:   Oral   Weight:      Height:         Dilation: 4 Effacement (%): 60 Presentation: Vertex Exam by:: Dr. Robinette Haines: baseline rate 150, moderate varibility, +acel, no decel Toco: difficult to trace on toco, palpated to be q4-5 min per RN   Labs: Lab Results  Component Value Date   WBC 14.5 (H) 05/15/2018   HGB 12.5 05/15/2018   HCT 35.9 (L) 05/15/2018   MCV 94.7 05/15/2018   PLT 200 05/15/2018    Patient Active Problem List   Diagnosis Date Noted  . Admitted to labor and delivery 05/15/2018  . Condyloma 04/15/2018  . Supervision of normal pregnancy 11/04/2017  . History of abnormal cervical Pap smear 11/04/2017  . Cigarette smoker 04/26/2017  . Reflux esophagitis 04/17/2017  . Obesity 04/17/2017  . Tobacco abuse 04/17/2017  . Peptic ulcer disease 04/13/2017  . 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 Intractable abdominal pain 04/11/2017  . Hyperglycemia 04/11/2017  . Anxiety 04/11/2017    Assessment / Plan: 27 y.o. G2P0010 at 105w1d here for PROM.  GHTN: No severe range pressures. Pre-E labs WNL. Continue to monitor.  GBS+:PCR result is +. Patient has been receiving PCN.  Labor: Latent. Given patient has been ruptured for close to 12 hours without significant cervical change, will augment with pitocin to achieve more adequate ctx pattern.  Fetal Wellbeing:  Cat I  Pain  Control:  Epidural upon request  Anticipated MOD:  NSVD   Phill Myron, D.O. OB Fellow  05/15/2018, 12:45 PM

## 2018-05-15 NOTE — Anesthesia Preprocedure Evaluation (Signed)
Anesthesia Evaluation  Patient identified by MRN, date of birth, ID band Patient awake    Reviewed: Allergy & Precautions, H&P , NPO status , Patient's Chart, lab work & pertinent test results  Airway Mallampati: II  TM Distance: >3 FB Neck ROM: full    Dental no notable dental hx. (+) Teeth Intact   Pulmonary Current Smoker,    Pulmonary exam normal breath sounds clear to auscultation       Cardiovascular negative cardio ROS Normal cardiovascular exam Rhythm:regular Rate:Normal     Neuro/Psych    GI/Hepatic Neg liver ROS,   Endo/Other  negative endocrine ROS  Renal/GU negative Renal ROS  negative genitourinary   Musculoskeletal   Abdominal (+) + obese,   Peds  Hematology negative hematology ROS (+)   Anesthesia Other Findings   Reproductive/Obstetrics (+) Pregnancy                             Anesthesia Physical Anesthesia Plan  ASA: II  Anesthesia Plan: Epidural   Post-op Pain Management:    Induction:   PONV Risk Score and Plan:   Airway Management Planned:   Additional Equipment:   Intra-op Plan:   Post-operative Plan:   Informed Consent: I have reviewed the patients History and Physical, chart, labs and discussed the procedure including the risks, benefits and alternatives for the proposed anesthesia with the patient or authorized representative who has indicated his/her understanding and acceptance.     Plan Discussed with:   Anesthesia Plan Comments:         Anesthesia Quick Evaluation

## 2018-05-15 NOTE — MAU Note (Signed)
Urine in lab 

## 2018-05-15 NOTE — Progress Notes (Cosign Needed)
Labor Progress Note Debbie Santana is a 27 y.o. G2P0010 at [redacted]w[redacted]d presented in PROM, currently in the latent phase.  S:   O:  BP (!) 144/83 (BP Location: Right Arm)   Pulse (!) 114   Temp 98.3 F (36.8 C) (Oral)   Resp 18   Ht 5\' 9"  (1.753 m)   Wt 118.3 kg   LMP 08/28/2017 (Exact Date)   BMI 38.51 kg/m  EFM: 135 /moderate var/positive accels/no decels  CVE: Dilation: 3 Exam by:: Dr. Criss Rosales   A&P: 27 y.o. G2P0010 [redacted]w[redacted]d presenting with PROM in latent labor #Labor: Pit ordered, not running yet #Pain: wants epidural, not placed yet #FWB: Category I tracing #GBS pending receiving penicillin   Matilde Haymaker, MD 12:06 PM

## 2018-05-15 NOTE — Anesthesia Procedure Notes (Signed)
Epidural Patient location during procedure: OB Start time: 05/15/2018 2:06 PM End time: 05/15/2018 2:11 PM  Staffing Anesthesiologist: Lyn Hollingshead, MD Performed: anesthesiologist   Preanesthetic Checklist Completed: patient identified, site marked, surgical consent, pre-op evaluation, timeout performed, IV checked, risks and benefits discussed and monitors and equipment checked  Epidural Patient position: sitting Prep: site prepped and draped and DuraPrep Patient monitoring: continuous pulse ox and blood pressure Approach: midline Location: L3-L4 Injection technique: LOR air  Needle:  Needle type: Tuohy  Needle gauge: 17 G Needle length: 9 cm and 9 Needle insertion depth: 7 cm Catheter type: closed end flexible Catheter size: 19 Gauge Catheter at skin depth: 12 cm Test dose: negative and Other  Assessment Sensory level: T10 Events: blood not aspirated, injection not painful, no injection resistance, negative IV test and no paresthesia  Additional Notes Reason for block:procedure for pain

## 2018-05-15 NOTE — Anesthesia Pain Management Evaluation Note (Signed)
  CRNA Pain Management Visit Note  Patient: Debbie Santana, 27 y.o., female  "Hello I am a member of the anesthesia team at [facility]. We have an anesthesia team available at all times to provide care throughout the hospital, including epidural management and anesthesia for C-section. I don't know your plan for the delivery whether it a natural birth, water birth, IV sedation, nitrous supplementation, doula or epidural, but we want to meet your pain goals."   1.Was your pain managed to your expectations on prior hospitalizations?   Yes   2.What is your expectation for pain management during this hospitalization?     Epidural  3.How can we help you reach that goal? Epidural when appropriate  Record the patient's initial score and the patient's pain goal.   Pain: 5  Pain Goal: 5 [facility] wants you to be able to say your pain was always managed very well.  Bufford Spikes 05/15/2018

## 2018-05-15 NOTE — Progress Notes (Signed)
Patients epidural catheter removed per anesthesia after delivery at 1956. Unable to chart it was stopped in the [facility] at the same time of 1956.   Juluis Mire, RN

## 2018-05-15 NOTE — H&P (Addendum)
OBSTETRIC ADMISSION HISTORY AND PHYSICAL  Debbie Santana is a 27 y.o. female G2P0010 with IUP at [redacted]w[redacted]d by LMP presenting for PROM.   Reports fetal movement. Denies vaginal bleeding.  She received her prenatal care at [facility].  Support person in labor:  Mom/sister in MAU  Ultrasounds . Anatomy U/S: normal  Prenatal History/Complications: . 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 GBS unknown . GHTN  Past Medical History: Past Medical History:  Diagnosis Date  . Bradycardia 04/17/2017  . Chronic back pain   . 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 Gastric ulcer   . Peptic ulcer disease 04/13/2017  . Sciatica   . Vaginal Pap smear, abnormal     Past Surgical History: Past Surgical History:  Procedure Laterality Date  . BACK SURGERY    . BIOPSY  04/14/2017   Procedure: BIOPSY;  Surgeon: Rogene Houston, MD;  [facility];  Service: Endoscopy;;  gastric  . CHOLECYSTECTOMY N/A 04/17/2017   Procedure: LAPAROSCOPIC CHOLECYSTECTOMY;  Surgeon: Aviva Signs, MD;  Location: [facility];  Service: General;  Laterality: N/A;  . ESOPHAGOGASTRODUODENOSCOPY (EGD) WITH PROPOFOL N/A 04/14/2017   Procedure: ESOPHAGOGASTRODUODENOSCOPY (EGD) WITH PROPOFOL;  Surgeon: Rogene Houston, MD;  Location[facility];  Service: Endoscopy;  Laterality: N/A;  . HERNIA REPAIR      Obstetrical History: OB History    Gravida  2   Para      Term      Preterm      AB  1   Living        SAB  1   TAB      Ectopic      Multiple      Live Births              Social History: Social History   Socioeconomic History  . Marital status: Single    Spouse name: Not on file  . Number of children: Not on file  . Years of education: Not on file  . Highest education level: Not on file  Occupational History  . Not on file  Social Needs  . Financial resource strain: Not on file  . Food insecurity:    Worry: Not on file    Inability: Not on file  . Transportation needs:    Medical: Not on  file    Non-medical: Not on file  Tobacco Use  . Smoking status: Current Every Day Smoker    Packs/day: 0.50    Types: Cigarettes  . Smokeless tobacco: Never Used  Substance and Sexual Activity  . Alcohol use: No    Comment: occasionally  . Drug use: No    Types: Marijuana    Comment: 04/11/17  . Sexual activity: Yes    Birth control/protection: None  Lifestyle  . Physical activity:    Days per week: Not on file    Minutes per session: Not on file  . Stress: Not on file  Relationships  . Social connections:    Talks on phone: Not on file    Gets together: Not on file    Attends religious service: Not on file    Active member of club or organization: Not on file    Attends meetings of clubs or organizations: Not on file    Relationship status: Not on file  Other Topics Concern  . Not on file  Social History Narrative  . Not on file    Family History: Family History  Problem Relation  Age of Onset  . Anxiety disorder Mother   . Hyperlipidemia Mother   . Diabetes Maternal Grandfather   . Diabetes Cousin   . Learning disabilities Cousin     Allergies: No Known Allergies  Medications Prior to Admission  Medication Sig Dispense Refill Last Dose  . acyclovir (ZOVIRAX) 400 MG tablet Take 1 tablet (400 mg total) by mouth 3 (three) times daily. 90 tablet 2 05/14/2018 at Unknown time  . acetaminophen (TYLENOL) 325 MG tablet Take 650 mg by mouth every 6 (six) hours as needed.   Taking  . diphenhydrAMINE (BENADRYL) 25 MG tablet Take 25 mg by mouth as needed.   Taking  . Prenatal Vit-Fe Fumarate-FA (PRENATAL MULTIVITAMIN) TABS tablet Take 1 tablet by mouth daily at 12 noon.   Taking     Review of Systems  All systems reviewed and negative except as stated in HPI  Blood pressure 133/90, pulse 83, temperature 98.5 F (36.9 C), resp. rate 19, height 5\' 9"  (1.753 m), weight 118.3 kg, last menstrual period 08/28/2017. General appearance: alert, cooperative, appears stated age  and no distress Lungs: no respiratory distress Heart: regular rate  Abdomen: soft, non-tender; gravid b Pelvic: multiple condylomas, no HSV lesions noted with speculum exam Extremities: Homans sign is negative, no sign of DVT Presentation: cephalic Fetal monitoring: Cat 1 via external Uterine activity: regular 5-7 min Dilation: 3 Exam by:: Dr. Criss Rosales  Prenatal labs: ABO, Rh:   Antibody: Negative (06/11 0916) Rubella: 6.34 (02/12 1159) RPR: Non Reactive (06/11 0916)  HBsAg: Negative (02/12 1159)  HIV: Non Reactive (06/11 0916)  GBS:   pending Glucola: 157 Genetic screening:  undocumented  Prenatal Transfer Tool  Maternal Diabetes: No Genetic Screening: undocumented Maternal Ultrasounds/Referrals: Normal Fetal Ultrasounds or other Referrals:  None Maternal Substance Abuse:  No Significant Maternal Medications:  None Significant Maternal Lab Results: PreE labs and GBS pending   No results found for this or any previous visit (from the past 24 hour(s)).  Patient Active Problem List   Diagnosis Date Noted  . Condyloma 04/15/2018  . Supervision of normal pregnancy 11/04/2017  . History of abnormal cervical Pap smear 11/04/2017  . Cigarette smoker 04/26/2017  . Reflux esophagitis 04/17/2017  . Obesity 04/17/2017  . Tobacco abuse 04/17/2017  . Peptic ulcer disease 04/13/2017  . 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 Intractable abdominal pain 04/11/2017  . Hyperglycemia 04/11/2017  . Anxiety 04/11/2017    Assessment/Plan:  Debbie Santana is a 27 y.o. G2P0010 at [redacted]w[redacted]d here for PROM in latent labor  Labor: latent, PROM -- pain control: wants epidural  Fetal Wellbeing:  Cephalic by [place].  -- GBS (pending) -- continuous fetal monitoring - CAT 1 via external   Postpartum Planning -- bottle/undecided   Scott bland, DO  If pt doesn't go into labor, will augment.

## 2018-05-16 ENCOUNTER — Other Ambulatory Visit: Payer: Self-pay

## 2018-05-16 DIAGNOSIS — O4202 Full-term premature rupture of membranes, onset of labor within 24 hours of rupture: Secondary | ICD-10-CM

## 2018-05-16 DIAGNOSIS — O134 Gestational [pregnancy-induced] hypertension without significant proteinuria, complicating childbirth: Secondary | ICD-10-CM

## 2018-05-16 LAB — RPR: RPR Ser Ql: NONREACTIVE

## 2018-05-16 MED ORDER — CYCLOBENZAPRINE HCL 5 MG PO TABS
5.0000 mg | ORAL_TABLET | Freq: Three times a day (TID) | ORAL | Status: DC | PRN
  Administered 2018-05-16 (×2): 5 mg via ORAL
  Filled 2018-05-16 (×3): qty 1

## 2018-05-16 NOTE — Plan of Care (Signed)
Patient is progressing well, she is ambulating, voiding well, bleeding wnl , passing flatus

## 2018-05-16 NOTE — Anesthesia Postprocedure Evaluation (Signed)
Anesthesia Post Note  Patient: NOHELY WHITEHORN  Procedure(s) Performed: AN AD [place]     Patient location during evaluation: Mother Baby Anesthesia Type: Epidural Level of consciousness: awake and alert and oriented Pain management: satisfactory to patient Vital Signs Assessment: post-procedure vital signs reviewed and stable Respiratory status: respiratory function stable Cardiovascular status: stable Postop Assessment: no headache, no backache, epidural receding, patient able to bend at knees, no signs of nausea or vomiting and adequate PO intake Anesthetic complications: no    Last Vitals:  Vitals:   05/16/18 0223 05/16/18 0544  BP: 118/67 (!) 114/53  Pulse: 91 71  Resp: 19 17  Temp: 36.8 C 36.9 C  SpO2:      Last Pain:  Vitals:   05/16/18 0544  TempSrc: Oral  PainSc: 4    Pain Goal:                 Rodriguez Aguinaldo

## 2018-05-16 NOTE — Lactation Note (Signed)
This note was copied from a baby's chart. Lactation Consultation Note  Patient Name: Debbie Santana Today's Date: 05/16/2018   LC walked in room,  per mom she is not going to BF her baby, she only plans to  formula feed infant. .   Maternal Data    Feeding Feeding Type: Formula Nipple Type: Regular  LATCH Score                   Interventions    Lactation Tools Discussed/Used     Consult Status      Vicente Serene 05/16/2018, 5:59 AM

## 2018-05-16 NOTE — Progress Notes (Signed)
Post Partum Day 1 Subjective: no complaints, up ad lib, voiding, tolerating PO and + flatus  Objective: Blood pressure (!) 114/53, pulse 71, temperature 98.4 F (36.9 C), temperature source Oral, resp. rate 17, height 5\' 9"  (1.753 m), weight 118.3 kg, last menstrual period 08/28/2017, SpO2 97 %, unknown if currently breastfeeding.  Physical Exam:  General: alert, cooperative, appears stated age and no distress Lochia: appropriate Uterine Fundus: firm Incision: n/a DVT Evaluation: No evidence of DVT seen on physical exam.  Recent Labs    05/15/18 0409  HGB 12.5  HCT 35.9*    Assessment/Plan: Plan for discharge tomorrow   LOS: 1 day   Sherene Sires 05/16/2018, 8:45 AM

## 2018-05-16 NOTE — Clinical Social Work Maternal (Signed)
CLINICAL SOCIAL WORK MATERNAL/CHILD NOTE  Patient Details  Name: Debbie Santana MRN: [id number] Date of Birth: Feb 03, 1991  Date:  05/16/2018  Clinical Social Worker Initiating Note:  Madilyn Fireman, MSW, LCSW-A Date/Time: Initiated:  05/16/18/1257     Child's Name:  Debbie Santana   Biological Parents:  Mother, Father   Need for Interpreter:  None   Reason for Referral:  Current Substance Use/Substance Use During Pregnancy    Address:  [address] [place]  [address]    Phone number:  [phone] (home)     Additional phone number:   Household Members/Support Persons (HM/SP):   Household Member/Support Person 1   HM/SP Name Relationship DOB or Age  HM/SP -1 Debbie Santana Spouse, FOB    HM/SP -2        HM/SP -3        HM/SP -4        HM/SP -5        HM/SP -6        HM/SP -7        HM/SP -8          Natural Supports (not living in the home):  Friends, Immediate Family, Extended Family   Professional Supports: None   Employment: Unemployed   Type of Work:     Education:  9 to 11 years   Homebound arranged:    Museum/gallery curator Resources:  Medicaid   Other Resources:  [employer], Physicist, medical    Cultural/Religious Considerations Which May Impact Care:  None  Strengths:  Ability to meet basic needs , Home prepared for child    Psychotropic Medications:         Pediatrician:       Pediatrician List:   [place]      Pediatrician Fax Number:    Risk Factors/Current Problems:      Cognitive State:  Alert , Able to Concentrate    Mood/Affect:  Calm , Comfortable    CSW Assessment: CSW received consult for MOB due to history of anxiety and THC use during pregnancy. CSW met with MOB, FOB Debbie Santana, and Art therapist at bedside to complete assessment. CSW obtained permission from MOB to discuss with FOB present. FOB is Debbie Santana. MOB confirms her anxiety diagnosis  and states that she has always had anxiety. MOB reports not being on any medications at this time or prior for her anxiety. MOB and CSW discussed marijuana use during pregnancy, MOB stated she was using marijuana to help with her nausea. MOB reports that her last use of marijuana was in February. CSW educated MOB on hospital drug screening policies, MOB did not have questions or concerns about possible results. MOB denies any concerns about her prior THC use. MOB reports having a used, not expired car seat for [employer] with knowledge of installation and use. MOB reports not yet choosing a pediatrician for Debbie Santana yet because the one she intended to use is not accepting new patients. MOB reports Debbie Santana will sleep in a bassinet at home, SIDS precautions thoroughly reviewed. MOB is unemployed, receives [employer], [employer], and Physicist, medical. MOB reports a great support system outside of the hospital. This is MOB's first child. MOB did not graduate from high school. MOB reports having all baby necessities at home for [facility]. MOB does not have questions or concerns, CSW encouraged MOB to reach out  for assistance if anything arises.  CSW will continue to monitor chart for cord results and will make report if warranted. Infant UDS negative.  CSW Plan/Description:  No Further Intervention Required/No Barriers to Discharge, CSW Will Continue to Monitor Umbilical Cord Tissue Drug Screen Results and Make Report if Warranted, [place], LCSWA 05/16/2018, 1:01 PM

## 2018-05-17 MED ORDER — WITCH HAZEL-GLYCERIN EX PADS: 1.0000 "application " | MEDICATED_PAD | CUTANEOUS | 12 refills | Status: DC | PRN

## 2018-05-17 MED ORDER — BENZOCAINE-MENTHOL 20-0.5 % EX AERO: 1.0000 "application " | INHALATION_SPRAY | CUTANEOUS | 0 refills | Status: DC | PRN

## 2018-05-17 MED ORDER — IBUPROFEN 600 MG PO TABS: 600.0000 mg | ORAL_TABLET | Freq: Four times a day (QID) | ORAL | 0 refills | Status: DC

## 2018-05-17 NOTE — Discharge Summary (Addendum)
OB Discharge Summary     Patient Name: Debbie Santana DOB: [birth year] MRN: [id number]  Date of admission: 05/15/2018 Delivering MD: Nicolette Bang   Date of discharge: 05/17/2018  Admitting diagnosis: 42 WEEKS ROM CTX Intrauterine pregnancy: [redacted]w[redacted]d     Secondary diagnosis:  Active Problems:   Admitted to labor and delivery  Additional problems: GHTN GBS pos     Discharge diagnosis: Term Pregnancy Delivered                                                                                                Post partum procedures:.  Augmentation: Pitocin  Complications: None  Hospital course:  Induction of Labor With Vaginal Delivery   27 y.o. yo G2P1011 at [redacted]w[redacted]d was admitted to the hospital 05/15/2018 for induction of labor.  Indication for induction: Gestational hypertension.  Patient had an uncomplicated labor course as follows: Membrane Rupture Time/Date: 2:00 AM ,05/15/2018   Intrapartum Procedures: Episiotomy: None [1]                                         Lacerations:  Periurethral [8];Sulcus [9]  Patient had delivery of a Viable infant.  Information for the patient's newborn:  Joyell, Emami [[id number]]  Delivery Method: Vaginal, Spontaneous(Filed from Delivery Summary)   05/15/2018  Details of delivery can be found in separate delivery note.  Patient had a routine postpartum course. Patient is discharged home 05/17/18.  Physical exam  Vitals:   05/16/18 0544 05/16/18 1354 05/16/18 2204 05/17/18 0529  BP: (!) 114/53 123/77 (!) 139/91 (!) 122/55  Pulse: 71 93 95 92  Resp: 17 18 18 18   Temp: 98.4 F (36.9 C) 98.1 F (36.7 C) 98.5 F (36.9 C) 98.9 F (37.2 C)  TempSrc: Oral Oral Oral Oral  SpO2:  98%    Weight:      Height:       General: alert, cooperative and no distress Lochia: appropriate Uterine Fundus: firm Incision: N/A DVT Evaluation: No evidence of DVT seen on physical exam. Labs: Lab Results  Component Value Date   WBC 14.5 (H)  05/15/2018   HGB 12.5 05/15/2018   HCT 35.9 (L) 05/15/2018   MCV 94.7 05/15/2018   PLT 200 05/15/2018   CMP Latest Ref Rng & Units 05/15/2018  Glucose 70 - 99 mg/dL 93  BUN 6 - 20 mg/dL 6  Creatinine 0.44 - 1.00 mg/dL 0.65  Sodium 135 - 145 mmol/L 134(L)  Potassium 3.5 - 5.1 mmol/L 3.9  Chloride 98 - 111 mmol/L 106  CO2 22 - 32 mmol/L 19(L)  Calcium 8.9 - 10.3 mg/dL 9.3  Total Protein 6.5 - 8.1 g/dL 5.3(L)  Total Bilirubin 0.3 - 1.2 mg/dL 0.5  Alkaline Phos 38 - 126 U/L 95  AST 15 - 41 U/L 15  ALT 0 - 44 U/L 12    Discharge instruction: per After Visit Summary and "Baby and Me Booklet".  After visit meds:  Allergies as of 05/17/2018   No  Known Allergies     Medication List    STOP taking these medications   diphenhydramine-acetaminophen 25-500 MG Tabs tablet Commonly known as:  TYLENOL PM     TAKE these medications   acetaminophen 325 MG tablet Commonly known as:  TYLENOL Take 650 mg by mouth every 6 (six) hours as needed for moderate pain or headache.   acyclovir 400 MG tablet Commonly known as:  ZOVIRAX Take 1 tablet (400 mg total) by mouth 3 (three) times daily.   benzocaine-Menthol 20-0.5 % Aero Commonly known as:  DERMOPLAST Apply 1 application topically as needed for irritation (perineal discomfort).   diphenhydrAMINE 25 MG tablet Commonly known as:  BENADRYL Take 25 mg by mouth as needed (headaches).   ibuprofen 600 MG tablet Commonly known as:  ADVIL,MOTRIN Take 1 tablet (600 mg total) by mouth every 6 (six) hours.   prenatal multivitamin Tabs tablet Take 1 tablet by mouth daily at 12 noon.   witch hazel-glycerin pad Commonly known as:  TUCKS Apply 1 application topically as needed for hemorrhoids.       Diet: low salt diet  Activity: Advance as tolerated. Pelvic rest for 6 weeks.   Outpatient follow up:Marland Kitchen Follow up Appt: Future Appointments  Date Time Provider [place]  05/20/2018 11:15 AM Jonnie Kind, MD [facility]    Follow up Visit:No follow-ups on file.  Postpartum contraception: Undecided  Newborn Data: Live born female  Birth Weight: 6 lb 13.7 oz (3110 g) APGAR: 9, 9  Newborn Delivery   Birth date/time:  05/15/2018 19:29:00 Delivery type:  Vaginal, Spontaneous     Baby Feeding: Bottle Disposition:home with mother   05/17/2018 Sherene Sires, DO  I examined this patient and agree with the above assessment D Kellie Simmering cnm

## 2018-05-18 ENCOUNTER — Telehealth: Payer: Self-pay | Admitting: *Deleted

## 2018-05-18 NOTE — Telephone Encounter (Signed)
Lmom for pt to call us back to schedule pp appointment.  05-18-18  AS

## 2018-05-19 ENCOUNTER — Telehealth: Payer: Self-pay | Admitting: Obstetrics & Gynecology

## 2018-05-19 NOTE — Telephone Encounter (Signed)
Patient called stating that she has an appointment on Thursday for BP check but patient states that she is in a lot of pain and would like to know if the doctor's could call her something in. Please contact pt

## 2018-05-19 NOTE — Telephone Encounter (Signed)
Pt called stating that she has been having back and leg pain since she delivered. She describes the back pain as aching in her lower back and the leg pain as aching all over. Advised that the back pain could be related to the epidural she received during labor and that the aching in her legs could be related to pushing. Advised to try alternating tylenol and ibuprofen and using a heating pad to see if that will alleviate some of the pain. Advised to callus back if those things did not help or s/s worsened. Pt verbalized understanding.

## 2018-05-20 ENCOUNTER — Encounter: Payer: Medicaid Other | Admitting: Obstetrics and Gynecology

## 2018-05-21 ENCOUNTER — Ambulatory Visit (INDEPENDENT_AMBULATORY_CARE_PROVIDER_SITE_OTHER): Payer: Medicaid Other | Admitting: Advanced Practice Midwife

## 2018-05-21 ENCOUNTER — Encounter: Payer: Self-pay | Admitting: *Deleted

## 2018-05-21 ENCOUNTER — Ambulatory Visit: Payer: Medicaid Other | Admitting: *Deleted

## 2018-05-21 VITALS — BP 138/90 | HR 102

## 2018-05-21 DIAGNOSIS — O135 Gestational [pregnancy-induced] hypertension without significant proteinuria, complicating the puerperium: Secondary | ICD-10-CM | POA: Diagnosis not present

## 2018-05-21 DIAGNOSIS — O133 Gestational [pregnancy-induced] hypertension without significant proteinuria, third trimester: Secondary | ICD-10-CM

## 2018-05-21 DIAGNOSIS — Z8759 Personal history of other complications of pregnancy, childbirth and the puerperium: Secondary | ICD-10-CM

## 2018-05-21 DIAGNOSIS — Z0130 Encounter for examination of blood pressure without abnormal findings: Secondary | ICD-10-CM

## 2018-05-21 HISTORY — DX: Personal history of other complications of pregnancy, childbirth and the puerperium: Z87.59

## 2018-05-21 MED ORDER — BUTALBITAL-APAP-CAFFEINE 50-325-40 MG PO TABS: 1.0000 | ORAL_TABLET | Freq: Four times a day (QID) | ORAL | 0 refills | Status: DC | PRN

## 2018-05-21 MED ORDER — AMLODIPINE BESYLATE 5 MG PO TABS: 5.0000 mg | ORAL_TABLET | Freq: Every day | ORAL | 1 refills | Status: DC

## 2018-05-21 NOTE — Progress Notes (Signed)
[facility] Visit  Patient name: Debbie Santana MRN [id number]  Date of birth: 1991/05/11  CC & HPI:  Debbie Santana is a 27 y.o. Caucasian female presenting today for BP check .  She had a SVD on 8/23, had IOL d/t GHTN.  BPs were never severe range , normalized after delivery and wasn't dc'd on any meds.  Has had a bilateral frontal HA for a day or two, no vision changes.  Also having LBP w/radiating pain down buttocks. Long hx (including surgery) of LBP.   Pertinent History Reviewed:  Medical & Surgical Hx:   Past Medical History:  Diagnosis Date  . Bradycardia 04/17/2017  . Chronic back pain   . 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 Gastric ulcer   . Peptic ulcer disease 04/13/2017  . Sciatica   . Vaginal Pap smear, abnormal    Past Surgical History:  Procedure Laterality Date  . BACK SURGERY    . BIOPSY  04/14/2017   Procedure: BIOPSY;  Surgeon: Rogene Houston, MD;  [facility];  Service: Endoscopy;;  gastric  . CHOLECYSTECTOMY N/A 04/17/2017   Procedure: LAPAROSCOPIC CHOLECYSTECTOMY;  Surgeon: Aviva Signs, MD;  Location: [facility];  Service: General;  Laterality: N/A;  . ESOPHAGOGASTRODUODENOSCOPY (EGD) WITH PROPOFOL N/A 04/14/2017   Procedure: ESOPHAGOGASTRODUODENOSCOPY (EGD) WITH PROPOFOL;  Surgeon: Rogene Houston, MD;  [facility];  Service: Endoscopy;  Laterality: N/A;  . HERNIA REPAIR     Family History  Problem Relation Age of Onset  . Anxiety disorder Mother   . Hyperlipidemia Mother   . Diabetes Maternal Grandfather   . Diabetes Cousin   . Learning disabilities Cousin     Current Outpatient Medications:  .  acetaminophen (TYLENOL) 325 MG tablet, Take 650 mg by mouth every 6 (six) hours as needed for moderate pain or headache. , Disp: , Rfl:  .  acyclovir (ZOVIRAX) 400 MG tablet, Take 1 tablet (400 mg total) by mouth 3 (three) times daily. (Patient not taking: Reported on 05/21/2018), Disp: 90 tablet, Rfl: 2 .  amLODipine (NORVASC) 5 MG  tablet, Take 1 tablet (5 mg total) by mouth daily. (Patient not taking: Reported on 05/21/2018), Disp: 30 tablet, Rfl: 1 .  benzocaine-Menthol (DERMOPLAST) 20-0.5 % AERO, Apply 1 application topically as needed for irritation (perineal discomfort). (Patient not taking: Reported on 05/21/2018), Disp: 1 each, Rfl: 0 .  butalbital-acetaminophen-caffeine (FIORICET, ESGIC) 50-325-40 MG tablet, Take 1 tablet by mouth every 6 (six) hours as needed for headache. (Patient not taking: Reported on 05/21/2018), Disp: 20 tablet, Rfl: 0 .  diphenhydrAMINE (BENADRYL) 25 MG tablet, Take 25 mg by mouth as needed (headaches). , Disp: , Rfl:  .  ibuprofen (ADVIL,MOTRIN) 600 MG tablet, Take 1 tablet (600 mg total) by mouth every 6 (six) hours. (Patient not taking: Reported on 05/21/2018), Disp: 30 tablet, Rfl: 0 .  Prenatal Vit-Fe Fumarate-FA (PRENATAL MULTIVITAMIN) TABS tablet, Take 1 tablet by mouth daily at 12 noon., Disp: , Rfl:  .  witch hazel-glycerin (TUCKS) pad, Apply 1 application topically as needed for hemorrhoids. (Patient not taking: Reported on 05/21/2018), Disp: 40 each, Rfl: 12 Social History: Reviewed -  reports that she has been smoking cigarettes. She has been smoking about 0.50 packs per day. She has never used smokeless tobacco.  Review of Systems:   Constitutional: Negative for fever and chills Eyes: Negative for visual disturbances Respiratory: Negative for shortness of breath, dyspnea Cardiovascular: Negative for chest pain or palpitations  Gastrointestinal:  Negative for vomiting, diarrhea and constipation; no abdominal pain Genitourinary: Negative for dysuria and urgency, vaginal irritation or itching Musculoskeletal: LBP/radiating down buttocks Neurological: Negative for dizziness   Objective Findings:    Physical Examination: Vitals:   05/21/18 0928 05/21/18 0929  BP: 134/89 (!) 136/94  136/94  General appearance - well appearing, and in no distress Mental status - alert, oriented to  person, place, and time Chest:  Normal respiratory effort Heart - normal rate and regular rhythm Musculoskeletal:  Normal range of motion without pain Extremities:  No edema    No results found for this or any previous visit (from the past 24 hour(s)).    Assessment & Plan:  A:   Borderline BPs, hx GHTN P:  Norvasc 5mg  daily, Fioricet PRN.  Sister can check BP this weekend if pt get dizzy, lightheaded    Return for order nexplanon, BP check w/RN on 9/3.  Joaquim Lai Cresenzo-Dishmon CNM 05/21/2018 9:29 AM

## 2018-05-21 NOTE — Patient Instructions (Signed)
Sciatica Rehab Ask your health care provider which exercises are safe for you. Do exercises exactly as told by your health care provider and adjust them as directed. It is normal to feel mild stretching, pulling, tightness, or discomfort as you do these exercises, but you should stop right away if you feel sudden pain or your pain gets worse.Do not begin these exercises until told by your health care provider. Stretching and range of motion exercises These exercises warm up your muscles and joints and improve the movement and flexibility of your hips and your back. These exercises also help to relieve pain, numbness, and tingling. Exercise A: Sciatic nerve glide  1. Sit in a chair with your head facing down toward your chest. Place your hands behind your back. Let your shoulders slump forward. 2. Slowly straighten one of your knees while you tilt your head back as if you are looking toward the ceiling. Only straighten your leg as far as you can without making your symptoms worse. 3. Hold for __________ seconds. 4. Slowly return to the starting position. 5. Repeat with your other leg. Repeat __________ times. Complete this exercise __________ times a day. Exercise B: Knee to chest with hip adduction and internal rotation    1. Lie on your back on a firm surface with both legs straight. 2. Bend one of your knees and move it up toward your chest until you feel a gentle stretch in your lower back and buttock. Then, move your knee toward the shoulder that is on the opposite side from your leg. ? Hold your leg in this position by holding onto the front of your knee. 3. Hold for __________ seconds. 4. Slowly return to the starting position. 5. Repeat with your other leg. Repeat __________ times. Complete this exercise __________ times a day. Exercise C: Prone extension on elbows    1. Lie on your abdomen on a firm surface. A bed may be too soft for this exercise. 2. Prop yourself up on your  elbows. 3. Use your arms to help lift your chest up until you feel a gentle stretch in your abdomen and your lower back. ? This will place some of your body weight on your elbows. If this is uncomfortable, try stacking pillows under your chest. ? Your hips should stay down, against the surface that you are lying on. Keep your hip and back muscles relaxed. 4. Hold for __________ seconds. 5. Slowly relax your upper body and return to the starting position. Repeat __________ times. Complete this exercise __________ times a day. Strengthening exercises These exercises build strength and endurance in your back. Endurance is the ability to use your muscles for a long time, even after they get tired. Exercise D: Pelvic tilt  1. Lie on your back on a firm surface. Bend your knees and keep your feet flat. 2. Tense your abdominal muscles. Tip your pelvis up toward the ceiling and flatten your lower back into the floor. ? To help with this exercise, you may place a small towel under your lower back and try to push your back into the towel. 3. Hold for __________ seconds. 4. Let your muscles relax completely before you repeat this exercise. Repeat __________ times. Complete this exercise __________ times a day. Exercise E: Alternating arm and leg raises    1. Get on your hands and knees on a firm surface. If you are on a hard floor, you may want to use padding to cushion your knees, such as  an exercise mat. 2. Line up your arms and legs. Your hands should be below your shoulders, and your knees should be below your hips. 3. Lift your left leg behind you. At the same time, raise your right arm and straighten it in front of you. ? Do not lift your leg higher than your hip. ? Do not lift your arm higher than your shoulder. ? Keep your abdominal and back muscles tight. ? Keep your hips facing the ground. ? Do not arch your back. ? Keep your balance carefully, and do not hold your breath. 4. Hold for  __________ seconds. 5. Slowly return to the starting position and repeat with your right leg and your left arm. Repeat __________ times. Complete this exercise __________ times a day. Posture and body mechanics    Body mechanics refers to the movements and positions of your body while you do your daily activities. Posture is part of body mechanics. Good posture and healthy body mechanics can help to relieve stress in your body's tissues and joints. Good posture means that your spine is in its natural S-curve position (your spine is neutral), your shoulders are pulled back slightly, and your head is not tipped forward. The following are general guidelines for applying improved posture and body mechanics to your everyday activities. Standing     When standing, keep your spine neutral and your feet about hip-width apart. Keep a slight bend in your knees. Your ears, shoulders, and hips should line up.  When you do a task in which you stand in one place for a long time, place one foot up on a stable object that is 2-4 inches (5-10 cm) high, such as a footstool. This helps keep your spine neutral. Sitting     When sitting, keep your spine neutral and keep your feet flat on the floor. Use a footrest, if necessary, and keep your thighs parallel to the floor. Avoid rounding your shoulders, and avoid tilting your head forward.  When working at a desk or a computer, keep your desk at a height where your hands are slightly lower than your elbows. Slide your chair under your desk so you are close enough to maintain good posture.  When working at a computer, place your monitor at a height where you are looking straight ahead and you do not have to tilt your head forward or downward to look at the screen. Resting     When lying down and resting, avoid positions that are most painful for you.  If you have pain with activities such as sitting, bending, stooping, or squatting (flexion-based  activities), lie in a position in which your body does not bend very much. For example, avoid curling up on your side with your arms and knees near your chest (fetal position).  If you have pain with activities such as standing for a long time or reaching with your arms (extension-based activities), lie with your spine in a neutral position and bend your knees slightly. Try the following positions: ? Lying on your side with a pillow between your knees. ? Lying on your back with a pillow under your knees. Lifting     When lifting objects, keep your feet at least shoulder-width apart and tighten your abdominal muscles.  Bend your knees and hips and keep your spine neutral. It is important to lift using the strength of your legs, not your back. Do not lock your knees straight out.  Always ask for help to lift heavy  or awkward objects. This information is not intended to replace advice given to you by your health care provider. Make sure you discuss any questions you have with your health care provider. Document Released: 09/09/2005 Document Revised: 05/16/2016 Document Reviewed: 05/26/2015 Elsevier Interactive Patient Education  2017 [employer].

## 2018-05-26 ENCOUNTER — Ambulatory Visit: Payer: Medicaid Other

## 2018-05-26 VITALS — BP 131/76 | HR 84 | Ht 69.0 in | Wt 241.6 lb

## 2018-05-26 DIAGNOSIS — Z0130 Encounter for examination of blood pressure without abnormal findings: Secondary | ICD-10-CM

## 2018-05-26 NOTE — Progress Notes (Signed)
Pt here for blood pressure check 131/76 pulse 84. Spoke kim booker about result. Advise keep taking Norvasc 5 mg. Stop taking 2 days before pp visit.Pad CMA

## 2018-05-28 ENCOUNTER — Ambulatory Visit: Payer: Medicaid Other | Admitting: Advanced Practice Midwife

## 2018-05-28 ENCOUNTER — Telehealth: Payer: Self-pay | Admitting: *Deleted

## 2018-05-28 NOTE — Telephone Encounter (Signed)
Patient states she is still continuing to have headaches despite taking the Fioricet.  No visual changes.  Has checked her BP at home "a time or two" and has gotten around 130's/80's.  Will w/i tomorrow for BP check.

## 2018-05-29 ENCOUNTER — Telehealth: Payer: Self-pay | Admitting: Obstetrics & Gynecology

## 2018-05-29 ENCOUNTER — Encounter: Payer: Self-pay | Admitting: *Deleted

## 2018-05-29 ENCOUNTER — Ambulatory Visit (INDEPENDENT_AMBULATORY_CARE_PROVIDER_SITE_OTHER): Payer: Medicaid Other | Admitting: *Deleted

## 2018-05-29 ENCOUNTER — Telehealth: Payer: Self-pay | Admitting: *Deleted

## 2018-05-29 VITALS — BP 119/82 | HR 90

## 2018-05-29 DIAGNOSIS — Z0130 Encounter for examination of blood pressure without abnormal findings: Secondary | ICD-10-CM

## 2018-05-29 MED ORDER — HYDROCODONE-ACETAMINOPHEN 5-325 MG PO TABS: 1.0000 | ORAL_TABLET | Freq: Four times a day (QID) | ORAL | 0 refills | Status: DC | PRN

## 2018-05-29 NOTE — Telephone Encounter (Signed)
Ordered hydrocodone #15

## 2018-05-29 NOTE — Telephone Encounter (Signed)
Patient here for blood pressure check due to unresolved headaches despite taking Fioricet.  Headache is slightly better and is not having any visual changes. BP normal today.

## 2018-05-29 NOTE — Progress Notes (Signed)
Patient here for blood pressure check due to unresolved headaches despite taking Fioricet.  Headache is slightly better and is not having any visual changes.  Discussed with Dr Elonda Husky who is going to prescribe Lortab.  Pt agreeable to plan but discussed reasons to call our office or go to [facility].

## 2018-06-23 ENCOUNTER — Encounter: Payer: Self-pay | Admitting: Women's Health

## 2018-06-23 ENCOUNTER — Ambulatory Visit (INDEPENDENT_AMBULATORY_CARE_PROVIDER_SITE_OTHER): Payer: Medicaid Other | Admitting: Women's Health

## 2018-06-23 DIAGNOSIS — R87619 Unspecified abnormal cytological findings in specimens from cervix uteri: Secondary | ICD-10-CM | POA: Diagnosis not present

## 2018-06-23 DIAGNOSIS — R51 Headache: Secondary | ICD-10-CM | POA: Diagnosis not present

## 2018-06-23 DIAGNOSIS — O9089 Other complications of the puerperium, not elsewhere classified: Secondary | ICD-10-CM

## 2018-06-23 DIAGNOSIS — O165 Unspecified maternal hypertension, complicating the puerperium: Secondary | ICD-10-CM | POA: Insufficient documentation

## 2018-06-23 DIAGNOSIS — Z8759 Personal history of other complications of pregnancy, childbirth and the puerperium: Secondary | ICD-10-CM

## 2018-06-23 DIAGNOSIS — R519 Headache, unspecified: Secondary | ICD-10-CM

## 2018-06-23 HISTORY — DX: Unspecified maternal hypertension, complicating the puerperium: O16.5

## 2018-06-23 NOTE — Patient Instructions (Signed)
NO SEX UNTIL AFTER YOU GET YOUR BIRTH CONTROL   For Headaches:   Stay well hydrated, drink enough water so that your urine is clear, sometimes if you are dehydrated you can get headaches  Eat small frequent meals and snacks, sometimes if you are hungry you can get headaches  Sometimes you get headaches during pregnancy from the pregnancy hormones  You can try tylenol (1-2 regular strength 325mg  or 1-2 extra strength 500mg ) as directed on the box. The least amount of medication that works is best.   Cool compresses (cool wet washcloth or ice pack) to area of head that is hurting  You can also try drinking a caffeinated drink to see if this will help  If not helping, try below:  For Prevention of Headaches/Migraines:  CoQ10 100mg  three times daily  Vitamin B2 400mg  daily  Magnesium Oxide 400-600mg  daily  If You Get a Bad Headache/Migraine:  Benadryl 25mg    Magnesium Oxide  1 large Gatorade  2 extra strength Tylenol (1,000mg  total)  1 cup coffee or Coke  If this doesn't help please call us @ [phone] or send a mychart message

## 2018-06-23 NOTE — Progress Notes (Signed)
POSTPARTUM VISIT Patient name: Debbie Santana MRN [id number]  Date of birth: 09-Sep-1991 Chief Complaint:   postpartum visit (scheduled to get Nexplanon tomorrow; headaches and back pain)  History of Present Illness:   Debbie Santana is a 27 y.o. [id number] Caucasian female being seen today for a postpartum visit. She is 5 weeks postpartum following a spontaneous vaginal delivery at 37.1 gestational weeks after IOL for PROM, GHTN dx intrapartum. Anesthesia: epidural. I have fully reviewed the prenatal and intrapartum course. Pregnancy uncomplicated. Postpartum course has been complicated by pp HTN, rx'd norvasc at 1wk bp check, stopped 3d ago as directed. Home bp's have been 130s/80s. Postpartum headaches began right after delivery, has at least 2x/wk and lasts 24+ hours, frontal, slight sensitivity to light/sound, no visual changes. Fioricet didn't work, had to take 2 norco to make it go away, is out of them. Drinks sweet tea all day, no water, doesn't eat as well as she used to. Bleeding thin lochia. Bowel function is normal. Bladder function is normal.  Patient is not sexually active. Last sexual activity: prior to birth of baby.  Contraception method is scheduled for nexplanon tomorrow.  [place] Postpartum Depression Screening: negative. Score 6.  Some anxiety, hx of same, never on meds, states she coping well, good support. Denies SI/HI/II.   Last pap 04/23/17.  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  Patient's last menstrual period was 06/14/2018.  Baby's course has been uncomplicated. Baby is feeding by bottle.  Review of Systems:   Pertinent items are noted in HPI Denies Abnormal vaginal discharge w/ itching/odor/irritation, headaches, visual changes, shortness of breath, chest pain, abdominal pain, severe nausea/vomiting, or problems with urination or bowel movements. Pertinent History Reviewed:  Reviewed past medical,surgical,  obstetrical and family history.  Reviewed problem list, medications and allergies. OB History  Gravida Para Term Preterm AB Living  2 1 1   1 1   SAB TAB Ectopic Multiple Live Births  1     0 1    # Outcome Date GA Lbr Len/2nd Weight Sex Delivery Anes PTL Lv  2 Term 05/15/18 [redacted]w[redacted]d / 00:50 6 lb 13.7 oz (3.11 kg) M Vag-Spont EPI  LIV  1 SAB 2014           Physical Assessment:   Vitals:   06/23/18 0919  BP: 132/81  Pulse: 68  Weight: 238 lb 8 oz (108.2 kg)  Height: 5\' 9"  (1.753 m)  Body mass index is 35.22 kg/m.       Physical Examination:   General appearance: alert, well appearing, and in no distress  Mental status: alert, oriented to person, place, and time  Skin: warm & dry   Cardiovascular: normal heart rate noted   Respiratory: normal respiratory effort, no distress   Breasts: deferred, no complaints   Abdomen: soft, non-tender   Pelvic: VULVA: normal appearing vulva with no masses, tenderness or lesions, UTERUS: uterus is normal size, shape, consistency and nontender  Rectal: no hemorrhoids  Extremities: no edema       No results found for this or any previous visit (from the past 24 hour(s)).  Assessment & Plan:  1) Postpartum exam 2) 5 wks s/p SVB after IOL for PROM, GHTN dx intrapartum 3) Bottlefeeding 4) Depression screening 5) Contraception counseling, pt prefers Nexplanon, abstinence until insertion tomorrow 6) Resolved pp HTN 7) PP Headaches> gave printed list of prevention/relief measures, slowly decrease tea intake, increase water  intake, small snack q couple of hours. If not improved in 2wks let us know, will refer to headache clinic 8) Abnormal pap/colpo> schedule repeat colpo w/ MD in 3wks  Meds: No orders of the defined types were placed in this encounter.   Follow-up: Return for As scheduled tomorrow for nexplanon.   No orders of the defined types were placed in this encounter.   [place], [facility] 06/23/2018 10:10 AM

## 2018-06-24 ENCOUNTER — Encounter: Payer: Medicaid Other | Admitting: Women's Health

## 2018-09-07 DIAGNOSIS — Z113 Encounter for screening for infections with a predominantly sexual mode of transmission: Secondary | ICD-10-CM | POA: Diagnosis not present

## 2018-09-07 DIAGNOSIS — Z1388 Encounter for screening for disorder due to exposure to contaminants: Secondary | ICD-10-CM | POA: Diagnosis not present

## 2018-09-07 DIAGNOSIS — Z3009 Encounter for other general counseling and advice on contraception: Secondary | ICD-10-CM | POA: Diagnosis not present

## 2018-09-07 DIAGNOSIS — Z114 Encounter for screening for human immunodeficiency virus [HIV]: Secondary | ICD-10-CM | POA: Diagnosis not present

## 2018-09-07 DIAGNOSIS — R87611 Atypical squamous cells cannot exclude high grade squamous intraepithelial lesion on cytologic smear of cervix (ASC-H): Secondary | ICD-10-CM | POA: Diagnosis not present

## 2018-09-07 DIAGNOSIS — Z1151 Encounter for screening for human papillomavirus (HPV): Secondary | ICD-10-CM | POA: Diagnosis not present

## 2018-09-07 DIAGNOSIS — Z0389 Encounter for observation for other suspected diseases and conditions ruled out: Secondary | ICD-10-CM | POA: Diagnosis not present

## 2018-09-07 DIAGNOSIS — Z01419 Encounter for gynecological examination (general) (routine) without abnormal findings: Secondary | ICD-10-CM | POA: Diagnosis not present

## 2018-09-09 DIAGNOSIS — Z30017 Encounter for initial prescription of implantable subdermal contraceptive: Secondary | ICD-10-CM | POA: Diagnosis not present

## 2018-10-29 NOTE — Progress Notes (Signed)
error 

## 2018-11-03 ENCOUNTER — Other Ambulatory Visit (HOSPITAL_COMMUNITY)
Admission: RE | Admit: 2018-11-03 | Discharge: 2018-11-03 | Disposition: A | Discharge status: Home / Self Care | Payer: Medicaid Other | Source: Ambulatory Visit | Attending: Nurse Practitioner | Admitting: Nurse Practitioner

## 2018-11-03 DIAGNOSIS — R87611 Atypical squamous cells cannot exclude high grade squamous intraepithelial lesion on cytologic smear of cervix (ASC-H): Secondary | ICD-10-CM | POA: Diagnosis not present

## 2018-11-04 ENCOUNTER — Other Ambulatory Visit: Payer: Self-pay | Admitting: Nurse Practitioner

## 2018-11-04 DIAGNOSIS — D069 Carcinoma in situ of cervix, unspecified: Secondary | ICD-10-CM | POA: Diagnosis not present

## 2018-11-04 DIAGNOSIS — N871 Moderate cervical dysplasia: Secondary | ICD-10-CM | POA: Diagnosis not present

## 2018-11-19 ENCOUNTER — Other Ambulatory Visit: Payer: Self-pay

## 2018-11-19 ENCOUNTER — Ambulatory Visit: Payer: Medicaid Other | Admitting: Obstetrics & Gynecology

## 2018-11-19 ENCOUNTER — Encounter: Payer: Self-pay | Admitting: Obstetrics & Gynecology

## 2018-11-19 VITALS — BP 119/77 | HR 65 | Ht 69.0 in | Wt 235.0 lb

## 2018-11-19 DIAGNOSIS — R87613 High grade squamous intraepithelial lesion on cytologic smear of cervix (HGSIL): Secondary | ICD-10-CM | POA: Diagnosis not present

## 2018-11-19 DIAGNOSIS — A630 Anogenital (venereal) warts: Secondary | ICD-10-CM | POA: Diagnosis not present

## 2018-11-19 DIAGNOSIS — Z01818 Encounter for other preprocedural examination: Secondary | ICD-10-CM

## 2018-11-19 NOTE — Progress Notes (Signed)
Preoperative History and Physical  Debbie Santana is a 28 y.o. [id number] with No LMP recorded. Patient has had an implant. admitted for a laser conization of the cervix and laser ablation ofvulvar genital warts.  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 Bradycardia 04/17/2017  . Chronic back pain   . 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 Gastric ulcer   . Peptic ulcer disease 04/13/2017  . Sciatica   . Vaginal Pap smear, abnormal     PSH:     Past Surgical History:  Procedure Laterality Date  . BACK SURGERY    . BIOPSY  04/14/2017   Procedure: BIOPSY;  Surgeon: Rogene Houston, MD;  Location: [facility];  Service: Endoscopy;;  gastric  . CHOLECYSTECTOMY N/A 04/17/2017   Procedure: LAPAROSCOPIC CHOLECYSTECTOMY;  Surgeon: Aviva Signs, MD;  Location: [facility] ORS;  Service: General;  Laterality: N/A;  . COLPOSCOPY    . ESOPHAGOGASTRODUODENOSCOPY (EGD) WITH PROPOFOL N/A 04/14/2017   Procedure: ESOPHAGOGASTRODUODENOSCOPY (EGD) WITH PROPOFOL;  Surgeon: Rogene Houston, MD;  Location: [facility];  Service: Endoscopy;  Laterality: N/A;  . HERNIA REPAIR      POb/GynH:      OB History    Gravida  2   Para  1   Term  1   Preterm      AB  1   Living  1     SAB  1   TAB      Ectopic      Multiple  0   Live Births  1           SH:   Social History   Tobacco Use  . Smoking status: Current Every Day Smoker    Packs/day: 0.50    Types: Cigarettes  . Smokeless tobacco: Never Used  Substance Use Topics  . Alcohol use: No    Comment: occasionally  . Drug use: No    Types: Marijuana    Comment: 04/11/17    FH:    Family History  Problem Relation Age of Onset  . Anxiety disorder Mother   . Hyperlipidemia Mother   . Diabetes Maternal Grandfather   . Diabetes Cousin   . Learning disabilities Cousin      Allergies: No Known Allergies  Medications:      No current  outpatient medications on file.  Review of Systems:   Review of Systems  Constitutional: Negative for fever, chills, weight loss, malaise/fatigue and diaphoresis.  HENT: Negative for hearing loss, ear pain, nosebleeds, congestion, sore throat, neck pain, tinnitus and ear discharge.   Eyes: Negative for blurred vision, double vision, photophobia, pain, discharge and redness.  Respiratory: Negative for cough, hemoptysis, sputum production, shortness of breath, wheezing and stridor.   Cardiovascular: Negative for chest pain, palpitations, orthopnea, claudication, leg swelling and PND.  Gastrointestinal: Positive for abdominal pain. Negative for heartburn, nausea, vomiting, diarrhea, constipation, blood in stool and melena.  Genitourinary: Negative for dysuria, urgency, frequency, hematuria and flank pain.  Musculoskeletal: Negative for myalgias, back pain, joint pain and falls.  Skin: Negative for itching and rash.  Neurological: Negative for dizziness, tingling, tremors, sensory change, speech change, focal weakness, seizures, loss of consciousness, weakness and headaches.  Endo/Heme/Allergies: Negative for environmental allergies and polydipsia. Does not bruise/bleed easily.  Psychiatric/Behavioral: Negative for depression, suicidal ideas, hallucinations, memory loss and substance abuse. The patient is not nervous/anxious and does  not have insomnia.      PHYSICAL EXAM:  Blood pressure 119/77, pulse 65, height 5\' 9"  (1.753 m), weight 235 lb (106.6 kg), not currently breastfeeding.    Vitals reviewed. Constitutional: She is oriented to person, place, and time. She appears well-developed and well-nourished.  HENT:  Head: Normocephalic and atraumatic.  Right Ear: External ear normal.  Left Ear: External ear normal.  Nose: Nose normal.  Mouth/Throat: Oropharynx is clear and moist.  Eyes: Conjunctivae and EOM are normal. Pupils are equal, round, and reactive to light. Right eye exhibits no  discharge. Left eye exhibits no discharge. No scleral icterus.  Neck: Normal range of motion. Neck supple. No tracheal deviation present. No thyromegaly present.  Cardiovascular: Normal rate, regular rhythm, normal heart sounds and intact distal pulses.  Exam reveals no gallop and no friction rub.   No murmur heard. Respiratory: Effort normal and breath sounds normal. No respiratory distress. She has no wheezes. She has no rales. She exhibits no tenderness.  GI: Soft. Bowel sounds are normal. She exhibits no distension and no mass. There is tenderness. There is no rebound and no guarding.  Genitourinary:       Vulva is genital warts Vagina is pink moist without discharge Cervix bioprises HSIL Uterus is normal size, contour, position, consistency, mobility, non-tender Adnexa is negative with normal sized ovaries by sonogram  Musculoskeletal: Normal range of motion. She exhibits no edema and no tenderness.  Neurological: She is alert and oriented to person, place, and time. She has normal reflexes. She displays normal reflexes. No cranial nerve deficit. She exhibits normal muscle tone. Coordination normal.  Skin: Skin is warm and dry. No rash noted. No erythema. No pallor.  Psychiatric: She has a normal mood and affect. Her behavior is normal. Judgment and thought content normal.    Labs: No results found for this or any previous visit (from the past 336 hour(s)).  EKG: Orders placed or performed during the hospital encounter of 04/11/17  . ED EKG  . ED EKG  . EKG 12-Lead  . EKG 12-Lead  . EKG    Imaging Studies: No results found.    Assessment: Patient Active Problem List   Diagnosis Date Noted  . 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 History of gestational hypertension 05/21/2018  . Condyloma 04/15/2018  . History of abnormal cervical Pap smear 11/04/2017  . Cigarette smoker 04/26/2017  . Reflux esophagitis 04/17/2017  . Obesity 04/17/2017  . Tobacco abuse 04/17/2017  .  Peptic ulcer disease 04/13/2017  . 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 Intractable abdominal pain 04/11/2017  . Hyperglycemia 04/11/2017  . Anxiety 04/11/2017    Plan: Laser conization of the cervix Laser ablation of genital warts scheduled for 12/02/2018  Florian Buff 11/19/2018 10:02 AM     Face to face time:  15 minutes  Greater than 50% of the visit time was spent in counseling and coordination of care with the patient.  The summary and outline of the counseling and care coordination is summarized in the note above.   All questions were answered.

## 2018-11-24 NOTE — Patient Instructions (Signed)
Debbie Santana  11/24/2018     @PREFPERIOPPHARMACY @   Your procedure is scheduled on  12/02/2018.  Report to [facility] at  615   A.M.  Call this number if you have problems the morning of surgery:  [phone]   Remember:  Do not eat or drink after midnight.                         Take these medicines the morning of surgery with A SIP OF WATER  None    Do not wear jewelry, make-up or nail polish.  Do not wear lotions, powders, or perfumes, or deodorant.  Do not shave 48 hours prior to surgery.  Men may shave face and neck.  Do not bring valuables to the hospital.  [facility] is not responsible for any belongings or valuables.  Contacts, dentures or bridgework may not be worn into surgery.  Leave your suitcase in the car.  After surgery it may be brought to your room.  For patients admitted to the hospital, discharge time will be determined by your treatment team.  Patients discharged the day of surgery will not be allowed to drive home.   Name and phone number of your driver:   family Special instructions:  None  Please read over the following fact sheets that you were given. Anesthesia Post-op Instructions and Care and Recovery After Surgery       Genital Warts Genital warts are small growths in the area around the genitals or the anus. They are caused by a type of germ (HPV virus). This germ is spread from person to person during sex. It can be spread through vaginal, anal, and oral sex. Genital warts can lead to other problems if they are not treated. A person is more likely to have this condition if he or she:  Has sex without using a condom.  Has sex with many people.  Has sex before the age of 14.  Has a weak body defense (immune) system. This condition can be treated with medicines. Your doctor may also burn or freeze the warts. In some cases, surgery may be done to remove the warts. Follow these instructions at home: Medicines   Apply  over-the-counter and prescription medicines only as told by your doctor.  Do not use medicines that are meant for treating hand warts.  Talk with your doctor about using creams to treat itching. Instructions for women  Plan to have regular tests to check for cervical cancer. Your risk for this cancer increases when you have genital warts.  If you become pregnant, tell your doctor that you have had genital warts. The germ can be passed to the baby. General instructions  Do not touch or scratch the warts.  Do not have sex until your treatment is done.  Tell your current and past sexual partners about your condition. They may need treatment.  After treatment, use condoms during sex.  Keep all follow-up visits as told by your doctor. This is important. How is this prevented? Talk with your doctor about getting the HPV shot. The HPV shot:  Can help stop some HPV infections and cancers.  Is given to males and females who are 28-24 years old.  Will not work if you already have HPV.  Is not recommended for pregnant women. Contact a doctor if:  You have redness, swelling, or pain in the area of the treated  skin.  You have a fever.  You feel sick.  You feel lumps in the area around your genitals or anus.  You have bleeding in the area around your genitals or anus.  You have pain during sex. Summary  Genital warts are small growths in the areas around the genitals or the anus. They are caused by a type of germ (HPV virus).  The germ is spread by having vaginal, anal, or oral sex without using a condom.  This condition is treated using medicines. In some cases, freezing, burning, or surgery may be done to get rid of the warts.  This condition may be prevented by getting a HPV shot. This information is not intended to replace advice given to you by your health care provider. Make sure you discuss any questions you have with your health care provider. Document Released:  12/04/2009 Document Revised: 10/14/2017 Document Reviewed: 10/14/2017 Elsevier Interactive Patient Education  2019 [employer].  Cervical Laser Surgery, Care After  This sheet gives you information about how to care for yourself after your procedure. Your health care provider may also give you more specific instructions. If you have problems or questions, contact your health care provider. What can I expect after the procedure? After the procedure, it is common to have:  Pain or discomfort.  Mild cramping.  Bleeding, spotting, or brownish discharge from your vagina. Follow these instructions at home: Activity  Return to your normal activities as told by your health care provider. Ask your health care provider what activities are safe for you.  Do not lift anything that is heavier than 10 lb (4.5 kg), or the limit that your health care provider tells you, until he or she says that it is safe.  Do not have sex or put anything in your vagina until your health care provider says it is okay. General instructions  Take over-the-counter and prescription medicines only as told by your health care provider.  Do not drive or use heavy machinery while taking prescription pain medicine.  Wear sanitary pads to protect from bleeding, spotting, and discharge.  Do not use tampons or douche until your health care provider says it is okay.  It is up to you to get the results of your procedure. Ask your health care provider, or the department that is doing the procedure, when your results will be ready.  Keep all follow-up visits as told by your health care provider. This is important. Contact a health care provider if:  Your pain or cramping does not improve.  Your periods are more painful than usual.  You do not get your period as expected. Get help right away if:  You have any symptoms of infection, such as: ? A fever. ? Chills. ? Discharge that smells bad.  You have severe pain in  your abdomen.  You have heavy bleeding from your vagina (more than a normal period).  You have vaginal bleeding with clumps of blood (blood clots). Summary  After this procedure, it is common to have pain or discomfort and mild cramping. It is also common to have bleeding, spotting, or brownish discharge from your vagina.  You may need to wear sanitary pads to protect from bleeding, spotting, and discharge.  Do not have sex, use tampons, or douche until your health care provider says it is okay.  Return to your normal activities as told by your health care provider. Ask your health care provider what activities are safe for you.  Take over-the-counter  and prescription medicines only as told by your health care provider. These include medicines for pain. This information is not intended to replace advice given to you by your health care provider. Make sure you discuss any questions you have with your health care provider. Document Released: 07/29/2016 Document Revised: 07/29/2016 Document Reviewed: 07/29/2016 Elsevier Interactive Patient Education  2019 [employer] Anesthesia, Adult, Care After This sheet gives you information about how to care for yourself after your procedure. Your health care provider may also give you more specific instructions. If you have problems or questions, contact your health care provider. What can I expect after the procedure? After the procedure, the following side effects are common:  Pain or discomfort at the IV site.  Nausea.  Vomiting.  Sore throat.  Trouble concentrating.  Feeling cold or chills.  Weak or tired.  Sleepiness and fatigue.  Soreness and body aches. These side effects can affect parts of the body that were not involved in surgery. Follow these instructions at home:  For at least 24 hours after the procedure:  Have a responsible adult stay with you. It is important to have someone help care for you until you are  awake and alert.  Rest as needed.  Do not: ? Participate in activities in which you could fall or become injured. ? Drive. ? Use heavy machinery. ? Drink alcohol. ? Take sleeping pills or medicines that cause drowsiness. ? Make important decisions or sign legal documents. ? Take care of children on your own. Eating and drinking  Follow any instructions from your health care provider about eating or drinking restrictions.  When you feel hungry, start by eating small amounts of foods that are soft and easy to digest (bland), such as toast. Gradually return to your regular diet.  Drink enough fluid to keep your urine pale yellow.  If you vomit, rehydrate by drinking water, juice, or clear broth. General instructions  If you have sleep apnea, surgery and certain medicines can increase your risk for breathing problems. Follow instructions from your health care provider about wearing your sleep device: ? Anytime you are sleeping, including during daytime naps. ? While taking prescription pain medicines, sleeping medicines, or medicines that make you drowsy.  Return to your normal activities as told by your health care provider. Ask your health care provider what activities are safe for you.  Take over-the-counter and prescription medicines only as told by your health care provider.  If you smoke, do not smoke without supervision.  Keep all follow-up visits as told by your health care provider. This is important. Contact a health care provider if:  You have nausea or vomiting that does not get Santana with medicine.  You cannot eat or drink without vomiting.  You have pain that does not get Santana with medicine.  You are unable to pass urine.  You develop a skin rash.  You have a fever.  You have redness around your IV site that gets worse. Get help right away if:  You have difficulty breathing.  You have chest pain.  You have blood in your urine or stool, or you vomit  blood. Summary  After the procedure, it is common to have a sore throat or nausea. It is also common to feel tired.  Have a responsible adult stay with you for the first 24 hours after general anesthesia. It is important to have someone help care for you until you are awake and alert.  When you feel  hungry, start by eating small amounts of foods that are soft and easy to digest (bland), such as toast. Gradually return to your regular diet.  Drink enough fluid to keep your urine pale yellow.  Return to your normal activities as told by your health care provider. Ask your health care provider what activities are safe for you. This information is not intended to replace advice given to you by your health care provider. Make sure you discuss any questions you have with your health care provider. Document Released: 12/16/2000 Document Revised: 04/25/2017 Document Reviewed: 04/25/2017 Elsevier Interactive Patient Education  2019 [employer].

## 2018-11-27 ENCOUNTER — Other Ambulatory Visit: Payer: Self-pay

## 2018-11-27 ENCOUNTER — Encounter (HOSPITAL_COMMUNITY)
Admission: RE | Admit: 2018-11-27 | Discharge: 2018-11-27 | Disposition: A | Discharge status: Home / Self Care | Payer: Medicaid Other | Source: Ambulatory Visit | Attending: Obstetrics & Gynecology | Admitting: Obstetrics & Gynecology

## 2018-11-27 ENCOUNTER — Encounter (HOSPITAL_COMMUNITY): Payer: Self-pay

## 2018-11-27 DIAGNOSIS — Z01812 Encounter for preprocedural laboratory examination: Secondary | ICD-10-CM | POA: Insufficient documentation

## 2018-11-27 HISTORY — DX: Personal history of urinary calculi: Z87.442

## 2018-11-27 HISTORY — DX: Anxiety disorder, unspecified: F41.9

## 2018-11-27 LAB — CBC
HCT: 44.8 % (ref 36.0–46.0)
Hemoglobin: 14.6 g/dL (ref 12.0–15.0)
MCH: 29.9 pg (ref 26.0–34.0)
MCHC: 32.6 g/dL (ref 30.0–36.0)
MCV: 91.8 fL (ref 80.0–100.0)
Platelets: 230 10*3/uL (ref 150–400)
RBC: 4.88 MIL/uL (ref 3.87–5.11)
RDW: 12.8 % (ref 11.5–15.5)
WBC: 10 10*3/uL (ref 4.0–10.5)
nRBC: 0 % (ref 0.0–0.2)

## 2018-11-27 LAB — URINALYSIS, ROUTINE W REFLEX MICROSCOPIC
Bacteria, UA: NONE SEEN
Bilirubin Urine: NEGATIVE
Glucose, UA: NEGATIVE mg/dL
Ketones, ur: NEGATIVE mg/dL
Nitrite: NEGATIVE
Protein, ur: NEGATIVE mg/dL
Specific Gravity, Urine: 1.024 (ref 1.005–1.030)
pH: 6 (ref 5.0–8.0)

## 2018-11-27 LAB — COMPREHENSIVE METABOLIC PANEL
ALT: 27 U/L (ref 0–44)
AST: 19 U/L (ref 15–41)
Albumin: 4 g/dL (ref 3.5–5.0)
Alkaline Phosphatase: 79 U/L (ref 38–126)
Anion gap: 7 (ref 5–15)
BUN: 9 mg/dL (ref 6–20)
CO2: 22 mmol/L (ref 22–32)
Calcium: 9 mg/dL (ref 8.9–10.3)
Chloride: 108 mmol/L (ref 98–111)
Creatinine, Ser: 0.84 mg/dL (ref 0.44–1.00)
Glucose, Bld: 140 mg/dL — ABNORMAL HIGH (ref 70–99)
Potassium: 3.6 mmol/L (ref 3.5–5.1)
Sodium: 137 mmol/L (ref 135–145)
Total Bilirubin: 0.3 mg/dL (ref 0.3–1.2)
Total Protein: 7.1 g/dL (ref 6.5–8.1)

## 2018-11-27 LAB — RAPID HIV SCREEN (HIV 1/2 AB+AG)
HIV 1/2 ANTIBODIES: NONREACTIVE
HIV-1 P24 ANTIGEN - HIV24: NONREACTIVE

## 2018-11-27 LAB — HCG, QUANTITATIVE, PREGNANCY: hCG, Beta Chain, Quant, S: 1 m[IU]/mL (ref <5)

## 2018-12-02 ENCOUNTER — Other Ambulatory Visit: Payer: Self-pay

## 2018-12-02 ENCOUNTER — Encounter (HOSPITAL_COMMUNITY)
Admission: RE | Disposition: A | Discharge status: Home / Self Care | Payer: Self-pay | Source: Home / Self Care | Attending: Obstetrics & Gynecology

## 2018-12-02 ENCOUNTER — Encounter (HOSPITAL_COMMUNITY): Payer: Self-pay | Admitting: *Deleted

## 2018-12-02 ENCOUNTER — Ambulatory Visit (HOSPITAL_COMMUNITY): Payer: Medicaid Other | Admitting: Anesthesiology

## 2018-12-02 ENCOUNTER — Ambulatory Visit (HOSPITAL_COMMUNITY)
Admission: RE | Admit: 2018-12-02 | Discharge: 2018-12-02 | Disposition: A | Discharge status: Home / Self Care | Payer: Medicaid Other | Attending: Obstetrics & Gynecology | Admitting: Obstetrics & Gynecology

## 2018-12-02 DIAGNOSIS — A630 Anogenital (venereal) warts: Secondary | ICD-10-CM | POA: Diagnosis not present

## 2018-12-02 DIAGNOSIS — N871 Moderate cervical dysplasia: Secondary | ICD-10-CM | POA: Diagnosis present

## 2018-12-02 DIAGNOSIS — D069 Carcinoma in situ of cervix, unspecified: Secondary | ICD-10-CM | POA: Insufficient documentation

## 2018-12-02 DIAGNOSIS — F17210 Nicotine dependence, cigarettes, uncomplicated: Secondary | ICD-10-CM | POA: Insufficient documentation

## 2018-12-02 DIAGNOSIS — Z8711 Personal history of peptic ulcer disease: Secondary | ICD-10-CM | POA: Insufficient documentation

## 2018-12-02 HISTORY — PX: CERVICAL ABLATION: SHX5771

## 2018-12-02 HISTORY — PX: LASER ABLATION CONDOLAMATA: SHX5941

## 2018-12-02 SURGERY — ABLATION, CERVIX
Anesthesia: General

## 2018-12-02 MED ORDER — MIDAZOLAM HCL 2 MG/2ML IJ SOLN
INTRAMUSCULAR | Status: AC
  Filled 2018-12-02: qty 2

## 2018-12-02 MED ORDER — SILVER SULFADIAZINE 1 % EX CREA
TOPICAL_CREAM | CUTANEOUS | Status: AC
  Filled 2018-12-02: qty 50

## 2018-12-02 MED ORDER — SILVER SULFADIAZINE 1 % EX CREA: TOPICAL_CREAM | CUTANEOUS | 11 refills | Status: DC

## 2018-12-02 MED ORDER — NEOSTIGMINE METHYLSULFATE 10 MG/10ML IV SOLN
INTRAVENOUS | Status: DC | PRN
  Administered 2018-12-02: 2 mg via INTRAVENOUS

## 2018-12-02 MED ORDER — KETOROLAC TROMETHAMINE 30 MG/ML IJ SOLN
30.0000 mg | Freq: Once | INTRAMUSCULAR | Status: AC
  Administered 2018-12-02: 30 mg via INTRAVENOUS
  Filled 2018-12-02: qty 1

## 2018-12-02 MED ORDER — SUCCINYLCHOLINE CHLORIDE 200 MG/10ML IV SOSY
PREFILLED_SYRINGE | INTRAVENOUS | Status: AC
  Filled 2018-12-02: qty 10

## 2018-12-02 MED ORDER — HYDROMORPHONE HCL 1 MG/ML IJ SOLN
0.2500 mg | INTRAMUSCULAR | Status: DC | PRN
  Administered 2018-12-02: 0.5 mg via INTRAVENOUS
  Filled 2018-12-02: qty 0.5

## 2018-12-02 MED ORDER — BUPIVACAINE LIPOSOME 1.3 % IJ SUSP
20.0000 mL | Freq: Once | INTRAMUSCULAR | Status: DC
  Filled 2018-12-02: qty 20

## 2018-12-02 MED ORDER — FENTANYL CITRATE (PF) 100 MCG/2ML IJ SOLN
INTRAMUSCULAR | Status: DC | PRN
  Administered 2018-12-02 (×2): 50 ug via INTRAVENOUS

## 2018-12-02 MED ORDER — SILVER SULFADIAZINE 1 % EX CREA
TOPICAL_CREAM | CUTANEOUS | Status: DC | PRN
  Administered 2018-12-02: 1 via TOPICAL

## 2018-12-02 MED ORDER — GLYCOPYRROLATE 0.2 MG/ML IJ SOLN
INTRAMUSCULAR | Status: DC | PRN
  Administered 2018-12-02: .4 mg via INTRAVENOUS

## 2018-12-02 MED ORDER — ROCURONIUM 10MG/ML (10ML) SYRINGE FOR MEDFUSION PUMP - OPTIME
INTRAVENOUS | Status: DC | PRN
  Administered 2018-12-02: 30 mg via INTRAVENOUS

## 2018-12-02 MED ORDER — HYDROCODONE-ACETAMINOPHEN 7.5-325 MG PO TABS: 1.0000 | ORAL_TABLET | Freq: Once | ORAL | Status: DC | PRN

## 2018-12-02 MED ORDER — PROPOFOL 10 MG/ML IV BOLUS
INTRAVENOUS | Status: DC | PRN
  Administered 2018-12-02: 10 mg via INTRAVENOUS
  Administered 2018-12-02: 120 mg via INTRAVENOUS
  Administered 2018-12-02: 40 mg via INTRAVENOUS

## 2018-12-02 MED ORDER — WATER FOR IRRIGATION, STERILE IR SOLN
Status: DC | PRN
  Administered 2018-12-02: 1000 mL

## 2018-12-02 MED ORDER — CEFAZOLIN SODIUM-DEXTROSE 2-4 GM/100ML-% IV SOLN
2.0000 g | INTRAVENOUS | Status: AC
  Administered 2018-12-02: 2 g via INTRAVENOUS
  Filled 2018-12-02: qty 100

## 2018-12-02 MED ORDER — FERRIC SUBSULFATE 259 MG/GM EX SOLN
CUTANEOUS | Status: DC | PRN
  Administered 2018-12-02: 1 via TOPICAL

## 2018-12-02 MED ORDER — LACTATED RINGERS IV SOLN
INTRAVENOUS | Status: DC
  Administered 2018-12-02: 1000 mL via INTRAVENOUS

## 2018-12-02 MED ORDER — HYDROCODONE-ACETAMINOPHEN 5-325 MG PO TABS: 1.0000 | ORAL_TABLET | Freq: Four times a day (QID) | ORAL | 0 refills | Status: DC | PRN

## 2018-12-02 MED ORDER — PROMETHAZINE HCL 25 MG/ML IJ SOLN: 6.2500 mg | INTRAMUSCULAR | Status: DC | PRN

## 2018-12-02 MED ORDER — ACETIC ACID 5 % SOLN
Status: DC | PRN
  Administered 2018-12-02: 1 via TOPICAL

## 2018-12-02 MED ORDER — FENTANYL CITRATE (PF) 250 MCG/5ML IJ SOLN
INTRAMUSCULAR | Status: AC
  Filled 2018-12-02: qty 5

## 2018-12-02 MED ORDER — GLYCOPYRROLATE PF 0.2 MG/ML IJ SOSY
PREFILLED_SYRINGE | INTRAMUSCULAR | Status: AC
  Filled 2018-12-02: qty 2

## 2018-12-02 MED ORDER — PROPOFOL 10 MG/ML IV BOLUS
INTRAVENOUS | Status: AC
  Filled 2018-12-02: qty 20

## 2018-12-02 MED ORDER — ROCURONIUM BROMIDE 10 MG/ML (PF) SYRINGE
PREFILLED_SYRINGE | INTRAVENOUS | Status: AC
  Filled 2018-12-02: qty 10

## 2018-12-02 MED ORDER — NEOSTIGMINE METHYLSULFATE 3 MG/3ML IV SOSY
PREFILLED_SYRINGE | INTRAVENOUS | Status: AC
  Filled 2018-12-02: qty 3

## 2018-12-02 MED ORDER — ONDANSETRON HCL 4 MG/2ML IJ SOLN
INTRAMUSCULAR | Status: AC
  Filled 2018-12-02: qty 2

## 2018-12-02 MED ORDER — KETOROLAC TROMETHAMINE 10 MG PO TABS: 10.0000 mg | ORAL_TABLET | Freq: Three times a day (TID) | ORAL | 0 refills | Status: DC | PRN

## 2018-12-02 MED ORDER — MIDAZOLAM HCL 5 MG/5ML IJ SOLN
INTRAMUSCULAR | Status: DC | PRN
  Administered 2018-12-02: 2 mg via INTRAVENOUS

## 2018-12-02 MED ORDER — MIDAZOLAM HCL 2 MG/2ML IJ SOLN: 0.5000 mg | Freq: Once | INTRAMUSCULAR | Status: DC | PRN

## 2018-12-02 SURGICAL SUPPLY — 33 items
APPLICATOR COTTON TIP 6 STRL (MISCELLANEOUS) ×1 IMPLANT
APPLICATOR COTTON TIP 6IN STRL (MISCELLANEOUS) ×3
CLOTH BEACON ORANGE TIMEOUT ST (SAFETY) ×3 IMPLANT
COVER LIGHT HANDLE STERIS (MISCELLANEOUS) ×6 IMPLANT
COVER MAYO STAND XLG (DRAPE) ×3 IMPLANT
COVER WAND RF STERILE (DRAPES) ×2 IMPLANT
ELECT REM PT RETURN 9FT ADLT (ELECTROSURGICAL) ×3
ELECTRODE REM PT RTRN 9FT ADLT (ELECTROSURGICAL) ×1 IMPLANT
GAUZE 4X4 16PLY RFD (DISPOSABLE) ×3 IMPLANT
GLOVE BIO SURGEON STRL SZ7 (GLOVE) ×2 IMPLANT
GLOVE BIOGEL PI IND STRL 7.0 (GLOVE) ×1 IMPLANT
GLOVE BIOGEL PI IND STRL 8 (GLOVE) ×1 IMPLANT
GLOVE BIOGEL PI INDICATOR 7.0 (GLOVE) ×2
GLOVE BIOGEL PI INDICATOR 8 (GLOVE) ×2
GLOVE ECLIPSE 8.0 STRL XLNG CF (GLOVE) ×3 IMPLANT
GOWN STRL REUS W/TWL LRG LVL3 (GOWN DISPOSABLE) ×4 IMPLANT
GOWN STRL REUS W/TWL XL LVL3 (GOWN DISPOSABLE) ×3 IMPLANT
KIT TURNOVER KIT A (KITS) ×3 IMPLANT
LASER FIBER DISP 1000U (UROLOGICAL SUPPLIES) ×3 IMPLANT
MANIFOLD NEPTUNE II (INSTRUMENTS) ×3 IMPLANT
MARKER SKIN DUAL TIP RULER LAB (MISCELLANEOUS) ×3 IMPLANT
NS IRRIG 1000ML POUR BTL (IV SOLUTION) ×3 IMPLANT
PACK BASIC III (CUSTOM PROCEDURE TRAY) ×2
PACK SRG BSC III STRL LF ECLPS (CUSTOM PROCEDURE TRAY) ×1 IMPLANT
PAD ARMBOARD 7.5X6 YLW CONV (MISCELLANEOUS) ×3 IMPLANT
PREFILTER SMOKE EVAC (FILTER) ×3 IMPLANT
SCOPETTES 8  STERILE (MISCELLANEOUS) ×2
SCOPETTES 8 STERILE (MISCELLANEOUS) ×1 IMPLANT
SET BASIN LINEN APH (SET/KITS/TRAYS/PACK) ×3 IMPLANT
SHEET LAVH (DRAPES) ×3 IMPLANT
SWAB PROCTOSCOPIC (MISCELLANEOUS) ×3 IMPLANT
TUBING SMOKE EVAC CO2 (TUBING) ×3 IMPLANT
WATER STERILE IRR 1000ML POUR (IV SOLUTION) ×3 IMPLANT

## 2018-12-02 NOTE — Anesthesia Postprocedure Evaluation (Signed)
Anesthesia Post Note  Patient: Debbie Santana  Procedure(s) Performed: LASER ABLATION OF CERVIX (N/A ) LASER ABLATION CONDYLOMA ACCUMINATA LEFT AND RIGHT VULVA, PERINEUM AND PERIANAL (15 TOTAL) (N/A )  Patient location during evaluation: PACU Anesthesia Type: General Level of consciousness: awake and alert and oriented Pain management: pain level controlled Vital Signs Assessment: post-procedure vital signs reviewed and stable Respiratory status: spontaneous breathing Cardiovascular status: blood pressure returned to baseline and stable Postop Assessment: no apparent nausea or vomiting and adequate PO intake Anesthetic complications: no     Last Vitals:  Vitals:   12/02/18 0900 12/02/18 0907  BP: 110/70 131/70  Pulse:  92  Resp: 12 18  Temp:    SpO2:  97%    Last Pain:  Vitals:   12/02/18 0907  TempSrc:   PainSc: 3                  Ritaj Dullea

## 2018-12-02 NOTE — Transfer of Care (Signed)
Immediate Anesthesia Transfer of Care Note  Patient: Debbie Santana  Procedure(s) Performed: LASER ABLATION OF CERVIX (N/A ) LASER ABLATION CONDYLOMA ACCUMINATA LEFT AND RIGHT VULVA, PERINEUM AND PERIANAL (15 TOTAL) (N/A )  Patient Location: PACU  Anesthesia Type:General  Level of Consciousness: awake, alert  and oriented  Airway & Oxygen Therapy: Patient Spontanous Breathing  Post-op Assessment: Report given to RN  Post vital signs: Reviewed and stable  Last Vitals:  Vitals Value Taken Time  BP 136/74 12/02/2018  8:34 AM  Temp    Pulse 91 12/02/2018  8:37 AM  Resp 21 12/02/2018  8:37 AM  SpO2 97 % 12/02/2018  8:37 AM  Vitals shown include unvalidated device data.  Last Pain:  Vitals:   12/02/18 0636  TempSrc: Oral  PainSc: 0-No pain      Patients Stated Pain Goal: 5 (13/68/59 [id number])  Complications: No apparent anesthesia complications

## 2018-12-02 NOTE — Anesthesia Postprocedure Evaluation (Signed)
Anesthesia Post Note  Patient: Debbie Santana  Procedure(s) Performed: LASER ABLATION OF CERVIX (N/A ) LASER ABLATION CONDYLOMA ACCUMINATA LEFT AND RIGHT VULVA, PERINEUM AND PERIANAL (15 TOTAL) (N/A )  Patient location during evaluation: PACU     Last Vitals:  Vitals:   12/02/18 0636 12/02/18 0834  BP: 120/74   Pulse: 76   Resp: 18   Temp: 36.8 C (P) 36.7 C  SpO2: 95%     Last Pain:  Vitals:   12/02/18 0636  TempSrc: Oral  PainSc: 0-No pain                 Rechy Bost

## 2018-12-02 NOTE — Anesthesia Procedure Notes (Signed)
Procedure Name: Intubation Date/Time: 12/02/2018 7:39 AM Performed by: Ollen Bowl, CRNA Pre-anesthesia Checklist: Patient identified, Patient being monitored, Timeout performed, Emergency Drugs available and Suction available Patient Re-evaluated:Patient Re-evaluated prior to induction Oxygen Delivery Method: Circle system utilized Preoxygenation: Pre-oxygenation with 100% oxygen Induction Type: IV induction Ventilation: Mask ventilation without difficulty and Unable to mask ventilate Laryngoscope Size: Mac and 3 Grade View: Grade I Tube type: Oral Tube size: 7.0 mm Number of attempts: 1 Airway Equipment and Method: Stylet Placement Confirmation: ETT inserted through vocal cords under direct vision,  positive ETCO2 and breath sounds checked- equal and bilateral Secured at: 22 cm Tube secured with: Tape Dental Injury: Teeth and Oropharynx as per pre-operative assessment

## 2018-12-02 NOTE — Discharge Instructions (Signed)
Cervical Laser Surgery Cervical laser surgery is a procedure that uses a focused beam of light (laser) to shrink, destroy, or remove tissue from the opening of the uterus (cervix). You may have this surgery if:  You have abnormal cells (dysplasia) in your cervix that are an early sign of cancer.  Tissue from your cervix is to be tested for signs of disease. Tell a health care provider about:  Any allergies you have.  All medicines you are taking, including vitamins, herbs, eye drops, creams, and over-the-counter medicines.  Any problems you or family members have had with anesthetic medicines.  Any blood disorders you have.  Any surgeries you have had.  Any medical conditions you have.  Whether you are pregnant or may be pregnant. What are the risks? Generally, this is a safe procedure. However, problems may occur, including:  Bleeding.  Infection.  Allergic reactions to medicines or dyes.  Damage to other structures or organs.  Narrowing of the cervix (cervical stenosis). This can lead to infertility, cervical incompetency, and miscarriage or preterm labor in a future pregnancy. What happens before the procedure?  Ask your health care provider about changing or stopping your regular medicines. This is especially important if you are taking diabetes medicines or blood thinners.  For at least 24 hours before the procedure: ? Do not have sex. ? Do not use tampons. ? Do not douche. ? Do not use vaginal creams or medicines. What happens during the procedure?  To lower your risk of infection, your health care team will wash or sanitize their hands.  You will lie on your back with your feet raised in footrests (stirrups).  A device called a speculum will be put into your vagina to hold it open.  You will be given a medicine to numb the area (local anesthetic). You may feel cramping when the medicine is injected.  A magnifying instrument called a colposcope will be placed  into your vagina. It will shine a light and allow your health care provider to see your vagina and cervix.  A solution will be swabbed on your cervix to help your health care provider see the tissue.  A thin, flexible tube called an endoscope will be inserted into your vagina. The laser will be on the end of the endoscope.  The laser will be used to shrink, destroy, or remove the tissue.  A cotton swab soaked in solution may be used to remove any burned or destroyed tissue.  A paste may be applied to your cervix to stop bleeding. The procedure may vary among health care providers and hospitals. What happens after the procedure?  You may feel some pain or cramping for a few days.  You may have some bleeding and brownish discharge.  Wear sanitary pads for discharge.  Do not have sex or put anything in your vagina until your health care provider says it is okay.  Your health care provider may recommend that you limit your physical activity for a few days. Ask your health care provider what activities are safe for you. Summary  Cervical laser surgery is a procedure that uses a focused beam of light (laser) to shrink, destroy, or remove tissue from the cervix.  You may have this procedure if you have abnormal cells in your cervix that are an early sign of cancer, or if tissue from your cervix is to be tested for signs of disease.  Generally, this is a safe procedure. However, problems may occur.  Do  not have sex, use tampons, douche, or use vaginal creams or medicines for at least 24 hours before the procedure.  You may have pain, cramping, bleeding, and discharge for a few days after the procedure. This information is not intended to replace advice given to you by your health care provider. Make sure you discuss any questions you have with your health care provider. Document Released: 07/29/2016 Document Revised: 07/29/2016 Document Reviewed: 07/29/2016 Elsevier Interactive Patient  Education  2019 [employer].

## 2018-12-02 NOTE — H&P (Signed)
Preoperative History and Physical  Debbie Santana is a 28 y.o. [id number] with No LMP recorded. Patient has had an implant. admitted for a laser conization of the cervix and laser ablation ofvulvar genital warts.  Ectocervical and endocervical biopsies reveal HSIL Has scattered genital warts as well  PMH:        Past Medical History:  Diagnosis Date  . Bradycardia 04/17/2017  . Chronic back pain   . 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 Gastric ulcer   . Peptic ulcer disease 04/13/2017  . Sciatica   . Vaginal Pap smear, abnormal     PSH:          Past Surgical History:  Procedure Laterality Date  . BACK SURGERY    . BIOPSY  04/14/2017   Procedure: BIOPSY;  Surgeon: Rogene Houston, MD;  [facility]: [facility];  Service: Endoscopy;;  gastric  . CHOLECYSTECTOMY N/A 04/17/2017   Procedure: LAPAROSCOPIC CHOLECYSTECTOMY;  Surgeon: Aviva Signs, MD;  Location: [facility];  Service: General;  Laterality: N/A;  . COLPOSCOPY    . ESOPHAGOGASTRODUODENOSCOPY (EGD) WITH PROPOFOL N/A 04/14/2017   Procedure: ESOPHAGOGASTRODUODENOSCOPY (EGD) WITH PROPOFOL;  Surgeon: Rogene Houston, MD;  Location: [facility];  Service: Endoscopy;  Laterality: N/A;  . HERNIA REPAIR      POb/GynH:              OB History    Gravida  2   Para  1   Term  1   Preterm      AB  1   Living  1     SAB  1   TAB      Ectopic      Multiple  0   Live Births  1           SH:   Social History        Tobacco Use  . Smoking status: Current Every Day Smoker    Packs/day: 0.50    Types: Cigarettes  . Smokeless tobacco: Never Used  Substance Use Topics  . Alcohol use: No    Comment: occasionally  . Drug use: No    Types: Marijuana    Comment: 04/11/17    FH:         Family History  Problem Relation Age of Onset  . Anxiety disorder Mother   . Hyperlipidemia Mother   . Diabetes Maternal Grandfather   . Diabetes Cousin   . Learning disabilities  Cousin      Allergies: No Known Allergies  Medications:      No current outpatient medications on file.  Review of Systems:   Review of Systems  Constitutional: Negative for fever, chills, weight loss, malaise/fatigue and diaphoresis.  HENT: Negative for hearing loss, ear pain, nosebleeds, congestion, sore throat, neck pain, tinnitus and ear discharge.   Eyes: Negative for blurred vision, double vision, photophobia, pain, discharge and redness.  Respiratory: Negative for cough, hemoptysis, sputum production, shortness of breath, wheezing and stridor.   Cardiovascular: Negative for chest pain, palpitations, orthopnea, claudication, leg swelling and PND.  Gastrointestinal: Positive for abdominal pain. Negative for heartburn, nausea, vomiting, diarrhea, constipation, blood in stool and melena.  Genitourinary: Negative for dysuria, urgency, frequency, hematuria and flank pain.  Musculoskeletal: Negative for myalgias, back pain, joint pain and falls.  Skin: Negative for itching and rash.  Neurological: Negative for dizziness, tingling, tremors, sensory change, speech change, focal weakness, seizures, loss of consciousness, weakness and headaches.  Endo/Heme/Allergies: Negative for environmental  allergies and polydipsia. Does not bruise/bleed easily.  Psychiatric/Behavioral: Negative for depression, suicidal ideas, hallucinations, memory loss and substance abuse. The patient is not nervous/anxious and does not have insomnia.      PHYSICAL EXAM:  Blood pressure 119/77, pulse 65, height 5\' 9"  (1.753 m), weight 235 lb (106.6 kg), not currently breastfeeding.    Vitals reviewed. Constitutional: She is oriented to person, place, and time. She appears well-developed and well-nourished.  HENT:  Head: Normocephalic and atraumatic.  Right Ear: External ear normal.  Left Ear: External ear normal.  Nose: Nose normal.  Mouth/Throat: Oropharynx is clear and moist.  Eyes: Conjunctivae  and EOM are normal. Pupils are equal, round, and reactive to light. Right eye exhibits no discharge. Left eye exhibits no discharge. No scleral icterus.  Neck: Normal range of motion. Neck supple. No tracheal deviation present. No thyromegaly present.  Cardiovascular: Normal rate, regular rhythm, normal heart sounds and intact distal pulses.  Exam reveals no gallop and no friction rub.   No murmur heard. Respiratory: Effort normal and breath sounds normal. No respiratory distress. She has no wheezes. She has no rales. She exhibits no tenderness.  GI: Soft. Bowel sounds are normal. She exhibits no distension and no mass. There is tenderness. There is no rebound and no guarding.  Genitourinary:       Vulva is genital warts Vagina is pink moist without discharge Cervix bioprises HSIL Uterus is normal size, contour, position, consistency, mobility, non-tender Adnexa is negative with normal sized ovaries by sonogram  Musculoskeletal: Normal range of motion. She exhibits no edema and no tenderness.  Neurological: She is alert and oriented to person, place, and time. She has normal reflexes. She displays normal reflexes. No cranial nerve deficit. She exhibits normal muscle tone. Coordination normal.  Skin: Skin is warm and dry. No rash noted. No erythema. No pallor.  Psychiatric: She has a normal mood and affect. Her behavior is normal. Judgment and thought content normal.    Labs: Results for orders placed or performed during the hospital encounter of 11/27/18 (from the past 168 hour(s))  CBC   Collection Time: 11/27/18 10:59 AM  Result Value Ref Range   WBC 10.0 4.0 - 10.5 K/uL   RBC 4.88 3.87 - 5.11 MIL/uL   Hemoglobin 14.6 12.0 - 15.0 g/dL   HCT 44.8 36.0 - 46.0 %   MCV 91.8 80.0 - 100.0 fL   MCH 29.9 26.0 - 34.0 pg   MCHC 32.6 30.0 - 36.0 g/dL   RDW 12.8 11.5 - 15.5 %   Platelets 230 150 - 400 K/uL   nRBC 0.0 0.0 - 0.2 %  Comprehensive metabolic panel   Collection Time: 11/27/18  10:59 AM  Result Value Ref Range   Sodium 137 135 - 145 mmol/L   Potassium 3.6 3.5 - 5.1 mmol/L   Chloride 108 98 - 111 mmol/L   CO2 22 22 - 32 mmol/L   Glucose, Bld 140 (H) 70 - 99 mg/dL   BUN 9 6 - 20 mg/dL   Creatinine, Ser 0.84 0.44 - 1.00 mg/dL   Calcium 9.0 8.9 - 10.3 mg/dL   Total Protein 7.1 6.5 - 8.1 g/dL   Albumin 4.0 3.5 - 5.0 g/dL   AST 19 15 - 41 U/L   ALT 27 0 - 44 U/L   Alkaline Phosphatase 79 38 - 126 U/L   Total Bilirubin 0.3 0.3 - 1.2 mg/dL   GFR calc non Af Amer >60 >60 mL/min   GFR  calc Af Amer >60 >60 mL/min   Anion gap 7 5 - 15  hCG, quantitative, pregnancy   Collection Time: 11/27/18 10:59 AM  Result Value Ref Range   hCG, Beta Chain, Quant, S 1 <5 mIU/mL  Rapid HIV screen (HIV 1/2 Ab+Ag)   Collection Time: 11/27/18 10:59 AM  Result Value Ref Range   HIV-1 P24 Antigen - HIV24 NON REACTIVE NON REACTIVE   HIV 1/2 Antibodies NON REACTIVE NON REACTIVE   Interpretation (HIV Ag Ab)      A non reactive test result means that HIV 1 or HIV 2 antibodies and HIV 1 p24 antigen were not detected in the specimen.  Urinalysis, Routine w reflex microscopic   Collection Time: 11/27/18 10:59 AM  Result Value Ref Range   Color, Urine YELLOW YELLOW   APPearance HAZY (A) CLEAR   Specific Gravity, Urine 1.024 1.005 - 1.030   pH 6.0 5.0 - 8.0   Glucose, UA NEGATIVE NEGATIVE mg/dL   Hgb urine dipstick MODERATE (A) NEGATIVE   Bilirubin Urine NEGATIVE NEGATIVE   Ketones, ur NEGATIVE NEGATIVE mg/dL   Protein, ur NEGATIVE NEGATIVE mg/dL   Nitrite NEGATIVE NEGATIVE   Leukocytes,Ua TRACE (A) NEGATIVE   RBC / HPF 0-5 0 - 5 RBC/hpf   WBC, UA 0-5 0 - 5 WBC/hpf   Bacteria, UA NONE SEEN NONE SEEN   Squamous Epithelial / LPF 6-10 0 - 5   Mucus PRESENT    Hyaline Casts, UA PRESENT      EKG:    Orders placed or performed during the hospital encounter of 04/11/17  . ED EKG  . ED EKG  . EKG 12-Lead  . EKG 12-Lead  . EKG    Imaging Studies: Imaging Results  No  results found.      Assessment:\ HSIL of the cervix Condyloma accuminata                                                                  Plan: Laser conization of the cervix Laser ablation of genital warts scheduled for 12/02/2018  Florian Buff 11/19/2018 10:02 AM

## 2018-12-02 NOTE — Anesthesia Preprocedure Evaluation (Signed)
Anesthesia Evaluation  Patient identified by MRN, date of birth, ID band Patient awake    Reviewed: Allergy & Precautions, NPO status , Patient's Chart, lab work & pertinent test results  Airway Mallampati: II  TM Distance: >3 FB Neck ROM: Full    Dental no notable dental hx. (+) Missing   Pulmonary neg pulmonary ROS, Current Smoker,    Pulmonary exam normal breath sounds clear to auscultation       Cardiovascular Exercise Tolerance: Good negative cardio ROS Normal cardiovascular examI Rhythm:Regular Rate:Normal  Denies current HTN meds    Neuro/Psych Anxiety  Neuromuscular disease negative psych ROS   GI/Hepatic Neg liver ROS, PUD, Denies current meds    Endo/Other  negative endocrine ROS  Renal/GU negative Renal ROS  negative genitourinary   Musculoskeletal negative musculoskeletal ROS (+)   Abdominal   Peds negative pediatric ROS (+)  Hematology negative hematology ROS (+)   Anesthesia Other Findings   Reproductive/Obstetrics negative OB ROS                             Anesthesia Physical Anesthesia Plan  ASA: II  Anesthesia Plan: General   Post-op Pain Management:    Induction: Intravenous  PONV Risk Score and Plan:   Airway Management Planned: LMA and Oral ETT  Additional Equipment:   Intra-op Plan:   Post-operative Plan: Extubation in OR  Informed Consent: I have reviewed the patients History and Physical, chart, labs and discussed the procedure including the risks, benefits and alternatives for the proposed anesthesia with the patient or authorized representative who has indicated his/her understanding and acceptance.     Dental advisory given  Plan Discussed with: CRNA  Anesthesia Plan Comments: (LMA vs ETT as needed )        Anesthesia Quick Evaluation

## 2018-12-02 NOTE — Op Note (Signed)
2 surgeries  Preoperative Diagnosis:  High Grade Squamous Intraepithelial lesion, adequate colposcopy  Postoperative Diagnosis:  Same as above  Procedure:  Laser ablation of the cervix  Surgeon:  Florian Buff MD  Anaesthesia:  Laryngeal Mask Airway  Findings:  Patient had an abnormal pap smear which was evaluated in the office with colposcopy and directed biopsies.  Pathology report returned as high Grade SIL.  The colposcopy was adequate.  As a result, the patient is admitted for laser ablation of the cervix.  Description of Note:  Patient was taken to the OR and placed in the supine position where she underwent laryngeal mask airway anaesthesia.  She was placed in the dorsal lithotomy position.  She was draped for laser.  Graves speculum was placed and 3% acetic acid used and the laser microscope employed to perform colposcopy which confirmed the office findings.  Laser was used on typical cervical settings and used to vaporized the squamocolumnar junction to  depth of  5-7 mm peripherally and 7-9 mm centrally.  Surgical margin of several mm was employed beyond the acetowhite epithelium.  Hemostasis was achieved with the laser and Monsel's solution.   She received Ancef 2 gram and Toradol 30 mg IV preoperatively prophylactically.  Preoperative diagnosis: Multiple condyloma acuminata  Postoperative diagnosis: 15 condyloma acuminata of the right and left vulva perineal and perianal regions  Procedure: Laser ablation of condyloma acuminata  Anesthesia: Laryngeal mask airway  Surgeon:Mohamud Mrozek Chriss Driver, MD   Findings: After I completed laser ablation of the cervix turned my attention to the condyloma acuminata of the vulva perineum and perianal regions.  There were 15 in total.  Description of operation:  Holmium laser was used and placed on a power setting of 2.5 and a rate of 10 Laser was used to vaporize the condyloma acuminata getting a margin down into the dermis of the skin and then  going one layer deeper This was carried out at all 15 lesions Silvadene cream was placed in all the lesions at the end of the procedure and the patient will use that postoperatively as well There was no bleeding of any of these lesions  Patient was awakened from anesthesia taken covering good stable condition all counts correct x3 As stated above she received 2 g of Ancef and Toradol prophylactically  Will be seen in the office next week for follow-up  Florian Buff, MD  12/02/2018 8:45 AM

## 2018-12-03 ENCOUNTER — Encounter (HOSPITAL_COMMUNITY): Payer: Self-pay | Admitting: Obstetrics & Gynecology

## 2018-12-05 ENCOUNTER — Encounter (HOSPITAL_COMMUNITY): Payer: Self-pay | Admitting: Emergency Medicine

## 2018-12-05 ENCOUNTER — Emergency Department (HOSPITAL_COMMUNITY): Payer: Medicaid Other

## 2018-12-05 ENCOUNTER — Emergency Department (HOSPITAL_COMMUNITY)
Admission: EM | Admit: 2018-12-05 | Discharge: 2018-12-05 | Disposition: A | Discharge status: Home / Self Care | Payer: Medicaid Other | Attending: Emergency Medicine | Admitting: Emergency Medicine

## 2018-12-05 ENCOUNTER — Other Ambulatory Visit: Payer: Self-pay

## 2018-12-05 DIAGNOSIS — Z9889 Other specified postprocedural states: Secondary | ICD-10-CM | POA: Diagnosis not present

## 2018-12-05 DIAGNOSIS — Y939 Activity, unspecified: Secondary | ICD-10-CM | POA: Diagnosis not present

## 2018-12-05 DIAGNOSIS — F17210 Nicotine dependence, cigarettes, uncomplicated: Secondary | ICD-10-CM | POA: Insufficient documentation

## 2018-12-05 DIAGNOSIS — X58XXXA Exposure to other specified factors, initial encounter: Secondary | ICD-10-CM | POA: Diagnosis not present

## 2018-12-05 DIAGNOSIS — S39012A Strain of muscle, fascia and tendon of lower back, initial encounter: Secondary | ICD-10-CM | POA: Diagnosis not present

## 2018-12-05 DIAGNOSIS — Y929 Unspecified place or not applicable: Secondary | ICD-10-CM | POA: Diagnosis not present

## 2018-12-05 DIAGNOSIS — Y999 Unspecified external cause status: Secondary | ICD-10-CM | POA: Diagnosis not present

## 2018-12-05 DIAGNOSIS — S3992XA Unspecified injury of lower back, initial encounter: Secondary | ICD-10-CM | POA: Diagnosis present

## 2018-12-05 DIAGNOSIS — J40 Bronchitis, not specified as acute or chronic: Secondary | ICD-10-CM

## 2018-12-05 DIAGNOSIS — R05 Cough: Secondary | ICD-10-CM | POA: Diagnosis not present

## 2018-12-05 HISTORY — DX: Anogenital (venereal) warts: A63.0

## 2018-12-05 MED ORDER — TRAMADOL HCL 50 MG PO TABS
100.0000 mg | ORAL_TABLET | Freq: Once | ORAL | Status: DC
  Filled 2018-12-05: qty 2

## 2018-12-05 MED ORDER — ONDANSETRON HCL 4 MG PO TABS
4.0000 mg | ORAL_TABLET | Freq: Once | ORAL | Status: AC
  Administered 2018-12-05: 4 mg via ORAL
  Filled 2018-12-05: qty 1

## 2018-12-05 MED ORDER — DIAZEPAM 5 MG PO TABS
10.0000 mg | ORAL_TABLET | Freq: Once | ORAL | Status: AC
  Administered 2018-12-05: 10 mg via ORAL
  Filled 2018-12-05: qty 2

## 2018-12-05 MED ORDER — DEXAMETHASONE 4 MG PO TABS: 4.0000 mg | ORAL_TABLET | Freq: Two times a day (BID) | ORAL | 0 refills | Status: DC

## 2018-12-05 MED ORDER — CYCLOBENZAPRINE HCL 10 MG PO TABS: 10.0000 mg | ORAL_TABLET | Freq: Three times a day (TID) | ORAL | 0 refills | Status: DC

## 2018-12-05 MED ORDER — ALBUTEROL SULFATE HFA 108 (90 BASE) MCG/ACT IN AERS
2.0000 | INHALATION_SPRAY | Freq: Once | RESPIRATORY_TRACT | Status: AC
  Administered 2018-12-05: 2 via RESPIRATORY_TRACT
  Filled 2018-12-05 (×2): qty 6.7

## 2018-12-05 MED ORDER — PREDNISONE 20 MG PO TABS
40.0000 mg | ORAL_TABLET | Freq: Once | ORAL | Status: AC
  Administered 2018-12-05: 40 mg via ORAL
  Filled 2018-12-05: qty 2

## 2018-12-05 NOTE — ED Notes (Signed)
ED Provider at bedside. 

## 2018-12-05 NOTE — ED Notes (Signed)
Patient transported to X-ray 

## 2018-12-05 NOTE — ED Triage Notes (Signed)
Pt reports bad lower back pain since yesterday with no injury.

## 2018-12-05 NOTE — Discharge Instructions (Addendum)
Your examination favors lumbar strain.  Please use Flexeril 3 times daily.  Use Tylenol every 4 hours, or ibuprofen every 6 hours.  Your lung examination is consistent with a bronchitis.  Your chest x-ray is negative for pneumonia, collapsed lung, or other emergent changes.  Please use Decadron 2 times daily with food.  Please use albuterol 2 puffs every 4 hours if needed for wheezing or difficulty with your breathing.  Please see your primary physician or your back specialist for follow-up of your back pain and spasm.

## 2018-12-05 NOTE — ED Provider Notes (Signed)
[facility] EMERGENCY DEPARTMENT Provider Note   CSN: [id number] Arrival date & time: 12/05/18  1133    History   Chief Complaint Chief Complaint  Patient presents with  . Back Pain    HPI Debbie Santana is a 28 y.o. female.     Pt report ablasion surgery cervix 2 days ago  The history is provided by the patient.  Back Pain  Location:  Lumbar spine Quality:  Burning and shooting Pain severity:  Moderate Pain is:  Same all the time Duration:  1 day Timing:  Constant Progression:  Worsening Chronicity: acute on chronic. Context: twisting   Relieved by:  Nothing Worsened by:  Nothing Ineffective treatments:  Being still Associated symptoms: no abdominal pain, no bladder incontinence, no bowel incontinence, no chest pain, no dysuria, no paresthesias and no perianal numbness   Risk factors: recent surgery     Past Medical History:  Diagnosis Date  . Anxiety   . Bradycardia 04/17/2017  . Chronic back pain   . 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 Gastric ulcer   . Genital warts   . History of kidney stones   . Peptic ulcer disease 04/13/2017  . Sciatica   . Vaginal Pap smear, abnormal     Patient Active Problem List   Diagnosis Date Noted  . 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 History of gestational hypertension 05/21/2018  . Condyloma 04/15/2018  . History of abnormal cervical Pap smear 11/04/2017  . Cigarette smoker 04/26/2017  . Reflux esophagitis 04/17/2017  . Obesity 04/17/2017  . Tobacco abuse 04/17/2017  . Peptic ulcer disease 04/13/2017  . 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 Intractable abdominal pain 04/11/2017  . Hyperglycemia 04/11/2017  . Anxiety 04/11/2017    Past Surgical History:  Procedure Laterality Date  . BACK SURGERY     ruptured disc.  . BIOPSY  04/14/2017   Procedure: BIOPSY;  Surgeon: Rogene Houston, MD;  [facility];  Service: Endoscopy;;  gastric  . CERVICAL ABLATION N/A 12/02/2018   Procedure: LASER ABLATION OF CERVIX;   Surgeon: Florian Buff, MD;  Location: [facility];  Service: Gynecology;  Laterality: N/A;  . CHOLECYSTECTOMY N/A 04/17/2017   Procedure: LAPAROSCOPIC CHOLECYSTECTOMY;  Surgeon: Aviva Signs, MD;  Location: [facility];  Service: General;  Laterality: N/A;  . COLPOSCOPY    . ESOPHAGOGASTRODUODENOSCOPY (EGD) WITH PROPOFOL N/A 04/14/2017   Procedure: ESOPHAGOGASTRODUODENOSCOPY (EGD) WITH PROPOFOL;  Surgeon: Rogene Houston, MD;  [facility]: [facility];  Service: Endoscopy;  Laterality: N/A;  . HERNIA REPAIR Bilateral    inguinal  . LASER ABLATION CONDOLAMATA N/A 12/02/2018   Procedure: LASER ABLATION CONDYLOMA ACCUMINATA LEFT AND RIGHT VULVA, PERINEUM AND PERIANAL (15 TOTAL);  Surgeon: Florian Buff, MD;  Location: [facility];  Service: Gynecology;  Laterality: N/A;     OB History    Gravida  2   Para  1   Term  1   Preterm      AB  1   Living  1     SAB  1   TAB      Ectopic      Multiple  0   Live Births  1            Home Medications    Prior to Admission medications   Medication Sig Start Date End Date Taking? Authorizing Provider  Aspirin-Acetaminophen-Caffeine (GOODYS EXTRA STRENGTH) [phone] MG PACK Take 1 packet by mouth daily as needed (pain.).    [provider]  diphenhydrAMINE (BENADRYL) 25 MG tablet Take 25 mg by mouth 2 (two) times daily as needed (headaches/allergies.).    [provider]  HYDROcodone-acetaminophen (NORCO/VICODIN) 5-325 MG tablet Take 1 tablet by mouth every 6 (six) hours as needed. 12/02/18   Florian Buff, MD  ketorolac (TORADOL) 10 MG tablet Take 1 tablet (10 mg total) by mouth every 8 (eight) hours as needed. 12/02/18   Florian Buff, MD  silver sulfADIAZINE (SILVADENE) 1 % cream Apply 3 times a day 12/02/18   Florian Buff, MD    Family History Family History  Problem Relation Age of Onset  . Anxiety disorder Mother   . Hyperlipidemia Mother   . Diabetes Maternal Grandfather   . Diabetes Cousin   . Learning  disabilities Cousin     Social History Social History   Tobacco Use  . Smoking status: Current Every Day Smoker    Packs/day: 0.50    Years: 5.00    Pack years: 2.50    Types: Cigarettes  . Smokeless tobacco: Never Used  Substance Use Topics  . Alcohol use: No  . Drug use: No     Allergies   Patient has no known allergies.   Review of Systems Review of Systems  Constitutional: Negative for activity change.       All ROS Neg except as noted in HPI  HENT: Negative for nosebleeds.   Eyes: Negative for photophobia and discharge.  Respiratory: Negative for cough, shortness of breath and wheezing.   Cardiovascular: Negative for chest pain and palpitations.  Gastrointestinal: Negative for abdominal pain, blood in stool and bowel incontinence.  Genitourinary: Negative for bladder incontinence, dysuria, frequency and hematuria.  Musculoskeletal: Positive for back pain. Negative for arthralgias and neck pain.  Skin: Negative.   Neurological: Negative for dizziness, seizures, speech difficulty and paresthesias.  Psychiatric/Behavioral: Negative for confusion and hallucinations.     Physical Exam Updated Vital Signs BP 139/87 (BP Location: Right Arm)   Pulse 77   Temp 98.4 F (36.9 C) (Oral)   Resp 18   Ht 5\' 9"  (1.753 m)   Wt 107.5 kg   LMP 11/19/2018 (Approximate)   SpO2 100%   BMI 35.00 kg/m   Physical Exam Vitals signs and nursing note reviewed.  Constitutional:      Appearance: She is well-developed. She is not toxic-appearing.  HENT:     Head: Normocephalic.     Right Ear: Tympanic membrane and external ear normal.     Left Ear: Tympanic membrane and external ear normal.  Eyes:     General: Lids are normal.     Pupils: Pupils are equal, round, and reactive to light.  Neck:     Musculoskeletal: Normal range of motion and neck supple.     Vascular: No carotid bruit.  Cardiovascular:     Rate and Rhythm: Normal rate and regular rhythm.     Pulses: Normal  pulses.     Heart sounds: Normal heart sounds.  Pulmonary:     Effort: No respiratory distress.     Breath sounds: Rhonchi present.  Abdominal:     General: Bowel sounds are normal.     Palpations: Abdomen is soft.     Tenderness: There is no abdominal tenderness. There is no guarding.  Musculoskeletal: Normal range of motion.  Lymphadenopathy:     Head:     Right side of head: No submandibular adenopathy.     Left side of head: No submandibular adenopathy.  Cervical: No cervical adenopathy.  Skin:    General: Skin is warm and dry.  Neurological:     Mental Status: She is alert and oriented to person, place, and time.     Cranial Nerves: No cranial nerve deficit.     Sensory: No sensory deficit.  Psychiatric:        Speech: Speech normal.      ED Treatments / Results  Labs (all labs ordered are listed, but only abnormal results are displayed) Labs Reviewed - No data to display  EKG None  Radiology No results found.  Procedures Procedures (including critical care time)  Medications Ordered in ED Medications - No data to display   Initial Impression / Assessment and Plan / ED Course  I have reviewed the triage vital signs and the nursing notes.  Pertinent labs & imaging results that were available during my care of the patient were reviewed by me and considered in my medical decision making (see chart for details).          Final Clinical Impressions(s) / ED Diagnoses MDM  Vital signs reviewed.  Pulse oximetry is 100% on room air.  Within normal limits by my interpretation.  Patient presented initially with lower back pain.  The examination was consistent with spasm.  There were no gross neurologic deficits appreciated at this time.  I reviewed the previous MR of the patient's lumbar spine.  The patient has degenerative disc disease of multiple areas.  I have asked the patient to have this followed by her primary physician or her back specialist.  The  patient has bilateral rhonchi present.  Chest x-ray is negative for pneumonia, or other acute problems.  As noted above the pulse oximetry is 100% on room air.  There are no chest wall abnormalities.  The patient will be treated with albuterol and Decadron.  Have asked the patient to follow-up with her primary physician and to take her medications with her so primary physician will know the medications we treated her with today.  The patient is in agreement with this plan.  Patient is ambulatory at the time of discharge.   Final diagnoses:  Strain of lumbar region, initial encounter  Bronchitis    ED Discharge Orders         Ordered    cyclobenzaprine (FLEXERIL) 10 MG tablet  3 times daily     12/05/18 1405           Lily Kocher, PA-C 12/05/18 1411    Maudie Flakes, MD 12/05/18 1534

## 2018-12-07 ENCOUNTER — Emergency Department (HOSPITAL_COMMUNITY): Payer: Medicaid Other

## 2018-12-07 ENCOUNTER — Other Ambulatory Visit: Payer: Self-pay

## 2018-12-07 ENCOUNTER — Encounter (HOSPITAL_COMMUNITY): Payer: Self-pay | Admitting: *Deleted

## 2018-12-07 ENCOUNTER — Observation Stay (HOSPITAL_COMMUNITY)
Admission: EM | Admit: 2018-12-07 | Discharge: 2018-12-08 | Disposition: A | Discharge status: Home / Self Care | Payer: Medicaid Other | Attending: Internal Medicine | Admitting: Internal Medicine

## 2018-12-07 ENCOUNTER — Observation Stay (HOSPITAL_COMMUNITY): Payer: Medicaid Other

## 2018-12-07 DIAGNOSIS — R112 Nausea with vomiting, unspecified: Secondary | ICD-10-CM

## 2018-12-07 DIAGNOSIS — R197 Diarrhea, unspecified: Secondary | ICD-10-CM | POA: Diagnosis not present

## 2018-12-07 DIAGNOSIS — F17210 Nicotine dependence, cigarettes, uncomplicated: Secondary | ICD-10-CM | POA: Insufficient documentation

## 2018-12-07 DIAGNOSIS — N390 Urinary tract infection, site not specified: Secondary | ICD-10-CM | POA: Diagnosis present

## 2018-12-07 DIAGNOSIS — D72829 Elevated white blood cell count, unspecified: Secondary | ICD-10-CM | POA: Diagnosis present

## 2018-12-07 DIAGNOSIS — R109 Unspecified abdominal pain: Secondary | ICD-10-CM | POA: Diagnosis not present

## 2018-12-07 DIAGNOSIS — Z79899 Other long term (current) drug therapy: Secondary | ICD-10-CM | POA: Insufficient documentation

## 2018-12-07 DIAGNOSIS — E860 Dehydration: Secondary | ICD-10-CM | POA: Diagnosis present

## 2018-12-07 DIAGNOSIS — M545 Low back pain, unspecified: Secondary | ICD-10-CM | POA: Diagnosis present

## 2018-12-07 DIAGNOSIS — R1084 Generalized abdominal pain: Secondary | ICD-10-CM | POA: Diagnosis not present

## 2018-12-07 DIAGNOSIS — K529 Noninfective gastroenteritis and colitis, unspecified: Secondary | ICD-10-CM | POA: Diagnosis present

## 2018-12-07 DIAGNOSIS — R42 Dizziness and giddiness: Secondary | ICD-10-CM | POA: Diagnosis not present

## 2018-12-07 DIAGNOSIS — Z720 Tobacco use: Secondary | ICD-10-CM | POA: Diagnosis present

## 2018-12-07 HISTORY — DX: Nausea with vomiting, unspecified: R11.2

## 2018-12-07 LAB — CBC WITH DIFFERENTIAL/PLATELET
Abs Immature Granulocytes: 0.2 10*3/uL — ABNORMAL HIGH (ref 0.00–0.07)
Basophils Absolute: 0.1 10*3/uL (ref 0.0–0.1)
Basophils Relative: 0 %
Eosinophils Absolute: 0 10*3/uL (ref 0.0–0.5)
Eosinophils Relative: 0 %
HCT: 45.5 % (ref 36.0–46.0)
Hemoglobin: 15.7 g/dL — ABNORMAL HIGH (ref 12.0–15.0)
Immature Granulocytes: 1 %
Lymphocytes Relative: 8 %
Lymphs Abs: 2.1 10*3/uL (ref 0.7–4.0)
MCH: 30.8 pg (ref 26.0–34.0)
MCHC: 34.5 g/dL (ref 30.0–36.0)
MCV: 89.2 fL (ref 80.0–100.0)
Monocytes Absolute: 1.3 10*3/uL — ABNORMAL HIGH (ref 0.1–1.0)
Monocytes Relative: 5 %
Neutro Abs: 21.6 10*3/uL — ABNORMAL HIGH (ref 1.7–7.7)
Neutrophils Relative %: 86 %
Platelets: 309 10*3/uL (ref 150–400)
RBC: 5.1 MIL/uL (ref 3.87–5.11)
RDW: 12.4 % (ref 11.5–15.5)
WBC: 25.2 10*3/uL — ABNORMAL HIGH (ref 4.0–10.5)
nRBC: 0 % (ref 0.0–0.2)

## 2018-12-07 LAB — URINALYSIS, ROUTINE W REFLEX MICROSCOPIC
Bilirubin Urine: NEGATIVE
Glucose, UA: NEGATIVE mg/dL
Ketones, ur: NEGATIVE mg/dL
Nitrite: NEGATIVE
PH: 7 (ref 5.0–8.0)
Protein, ur: NEGATIVE mg/dL
Specific Gravity, Urine: 1.023 (ref 1.005–1.030)
WBC, UA: >50 WBC/hpf — ABNORMAL HIGH (ref 0–5)

## 2018-12-07 LAB — PREGNANCY, URINE: Preg Test, Ur: NEGATIVE

## 2018-12-07 LAB — COMPREHENSIVE METABOLIC PANEL WITH GFR
ALT: 25 U/L (ref 0–44)
AST: 20 U/L (ref 15–41)
Albumin: 4.1 g/dL (ref 3.5–5.0)
Alkaline Phosphatase: 87 U/L (ref 38–126)
Anion gap: 12 (ref 5–15)
BUN: 14 mg/dL (ref 6–20)
CO2: 23 mmol/L (ref 22–32)
Calcium: 9.7 mg/dL (ref 8.9–10.3)
Chloride: 103 mmol/L (ref 98–111)
Creatinine, Ser: 1.05 mg/dL — ABNORMAL HIGH (ref 0.44–1.00)
GFR calc Af Amer: >60 mL/min
GFR calc non Af Amer: >60 mL/min
Glucose, Bld: 145 mg/dL — ABNORMAL HIGH (ref 70–99)
Potassium: 3.7 mmol/L (ref 3.5–5.1)
Sodium: 138 mmol/L (ref 135–145)
Total Bilirubin: 0.6 mg/dL (ref 0.3–1.2)
Total Protein: 7.6 g/dL (ref 6.5–8.1)

## 2018-12-07 LAB — LIPASE, BLOOD: Lipase: 20 U/L (ref 11–51)

## 2018-12-07 MED ORDER — HYDROCODONE-ACETAMINOPHEN 5-325 MG PO TABS
1.0000 | ORAL_TABLET | ORAL | Status: DC | PRN
  Administered 2018-12-07 – 2018-12-08 (×2): 1 via ORAL
  Administered 2018-12-08: 2 via ORAL
  Filled 2018-12-07: qty 1
  Filled 2018-12-07: qty 2
  Filled 2018-12-07: qty 1

## 2018-12-07 MED ORDER — ONDANSETRON HCL 4 MG/2ML IJ SOLN
4.0000 mg | Freq: Once | INTRAMUSCULAR | Status: AC
  Administered 2018-12-07: 4 mg via INTRAVENOUS
  Filled 2018-12-07: qty 2

## 2018-12-07 MED ORDER — ALUM & MAG HYDROXIDE-SIMETH 200-200-20 MG/5ML PO SUSP
30.0000 mL | ORAL | Status: DC | PRN
  Administered 2018-12-07 – 2018-12-08 (×2): 30 mL via ORAL
  Filled 2018-12-07 (×2): qty 30

## 2018-12-07 MED ORDER — LORAZEPAM 2 MG/ML IJ SOLN
1.0000 mg | Freq: Once | INTRAMUSCULAR | Status: AC
  Administered 2018-12-07: 1 mg via INTRAVENOUS
  Filled 2018-12-07: qty 1

## 2018-12-07 MED ORDER — POTASSIUM CHLORIDE IN NACL 20-0.9 MEQ/L-% IV SOLN
INTRAVENOUS | Status: DC
  Administered 2018-12-07 – 2018-12-08 (×2): via INTRAVENOUS

## 2018-12-07 MED ORDER — ENOXAPARIN SODIUM 40 MG/0.4ML ~~LOC~~ SOLN: 40.0000 mg | SUBCUTANEOUS | Status: DC

## 2018-12-07 MED ORDER — IOHEXOL 300 MG/ML  SOLN
100.0000 mL | Freq: Once | INTRAMUSCULAR | Status: AC | PRN
  Administered 2018-12-07: 100 mL via INTRAVENOUS

## 2018-12-07 MED ORDER — KETOROLAC TROMETHAMINE 15 MG/ML IJ SOLN: 15.0000 mg | Freq: Once | INTRAMUSCULAR | Status: DC

## 2018-12-07 MED ORDER — SODIUM CHLORIDE 0.9 % IV BOLUS
1000.0000 mL | Freq: Once | INTRAVENOUS | Status: AC
  Administered 2018-12-07: 1000 mL via INTRAVENOUS

## 2018-12-07 MED ORDER — ACETAMINOPHEN 325 MG PO TABS: 650.0000 mg | ORAL_TABLET | Freq: Four times a day (QID) | ORAL | Status: DC | PRN

## 2018-12-07 MED ORDER — MORPHINE SULFATE (PF) 2 MG/ML IV SOLN
2.0000 mg | INTRAVENOUS | Status: DC | PRN
  Administered 2018-12-07 – 2018-12-08 (×8): 2 mg via INTRAVENOUS
  Filled 2018-12-07 (×8): qty 1

## 2018-12-07 MED ORDER — SODIUM CHLORIDE 0.9 % IV SOLN
1.0000 g | INTRAVENOUS | Status: DC
  Administered 2018-12-08: 1 g via INTRAVENOUS
  Filled 2018-12-07: qty 10

## 2018-12-07 MED ORDER — FENTANYL CITRATE (PF) 100 MCG/2ML IJ SOLN
INTRAMUSCULAR | Status: AC
  Filled 2018-12-07: qty 2

## 2018-12-07 MED ORDER — DICYCLOMINE HCL 10 MG/ML IM SOLN
20.0000 mg | Freq: Once | INTRAMUSCULAR | Status: AC
  Administered 2018-12-07: 20 mg via INTRAMUSCULAR
  Filled 2018-12-07: qty 2

## 2018-12-07 MED ORDER — METOCLOPRAMIDE HCL 5 MG/ML IJ SOLN
10.0000 mg | Freq: Once | INTRAMUSCULAR | Status: AC
  Administered 2018-12-07: 10 mg via INTRAVENOUS
  Filled 2018-12-07: qty 2

## 2018-12-07 MED ORDER — ONDANSETRON HCL 4 MG/2ML IJ SOLN
4.0000 mg | Freq: Four times a day (QID) | INTRAMUSCULAR | Status: DC | PRN
  Administered 2018-12-07 – 2018-12-08 (×5): 4 mg via INTRAVENOUS
  Filled 2018-12-07: qty 2

## 2018-12-07 MED ORDER — SODIUM CHLORIDE 0.9 % IV SOLN
1.0000 g | Freq: Once | INTRAVENOUS | Status: AC
  Administered 2018-12-07: 1 g via INTRAVENOUS
  Filled 2018-12-07: qty 10

## 2018-12-07 MED ORDER — ONDANSETRON HCL 4 MG PO TABS: 4.0000 mg | ORAL_TABLET | Freq: Four times a day (QID) | ORAL | Status: DC | PRN

## 2018-12-07 MED ORDER — PANTOPRAZOLE SODIUM 40 MG IV SOLR
40.0000 mg | Freq: Two times a day (BID) | INTRAVENOUS | Status: DC
  Administered 2018-12-07 – 2018-12-08 (×2): 40 mg via INTRAVENOUS
  Filled 2018-12-07 (×2): qty 40

## 2018-12-07 MED ORDER — DICYCLOMINE HCL 10 MG PO CAPS
20.0000 mg | ORAL_CAPSULE | Freq: Three times a day (TID) | ORAL | Status: DC
  Administered 2018-12-07 – 2018-12-08 (×4): 20 mg via ORAL
  Filled 2018-12-07 (×4): qty 2

## 2018-12-07 MED ORDER — ENOXAPARIN SODIUM 60 MG/0.6ML ~~LOC~~ SOLN
50.0000 mg | SUBCUTANEOUS | Status: DC
  Administered 2018-12-07 – 2018-12-08 (×2): 50 mg via SUBCUTANEOUS
  Filled 2018-12-07 (×2): qty 0.6

## 2018-12-07 MED ORDER — MORPHINE SULFATE (PF) 4 MG/ML IV SOLN
4.0000 mg | Freq: Once | INTRAVENOUS | Status: AC
  Administered 2018-12-07: 4 mg via INTRAVENOUS
  Filled 2018-12-07: qty 1

## 2018-12-07 MED ORDER — SODIUM CHLORIDE 0.9 % IV BOLUS
500.0000 mL | Freq: Once | INTRAVENOUS | Status: AC
  Administered 2018-12-07: 500 mL via INTRAVENOUS

## 2018-12-07 MED ORDER — ONDANSETRON HCL 4 MG/2ML IJ SOLN
4.0000 mg | Freq: Four times a day (QID) | INTRAMUSCULAR | Status: DC | PRN
  Filled 2018-12-07 (×4): qty 2

## 2018-12-07 MED ORDER — PROCHLORPERAZINE EDISYLATE 10 MG/2ML IJ SOLN
10.0000 mg | Freq: Once | INTRAMUSCULAR | Status: AC
  Administered 2018-12-07: 10 mg via INTRAVENOUS
  Filled 2018-12-07: qty 2

## 2018-12-07 MED ORDER — FENTANYL CITRATE (PF) 100 MCG/2ML IJ SOLN
50.0000 ug | Freq: Once | INTRAMUSCULAR | Status: AC
  Administered 2018-12-07: 50 ug via INTRAVENOUS

## 2018-12-07 MED ORDER — ACETAMINOPHEN 650 MG RE SUPP: 650.0000 mg | Freq: Four times a day (QID) | RECTAL | Status: DC | PRN

## 2018-12-07 NOTE — ED Provider Notes (Signed)
MSE was initiated and I personally evaluated the patient and placed orders (if any) at  6:24 AM on December 07, 2018.  On March 11 the patient had laser ablation of the cervix done by Dr. Elonda Husky.  Patient states she has not had a bowel movement since.  She states about 6 hours ago she started having nausea and vomiting "a lot", around 10.  She also describes 4-6 episodes of watery diarrhea.  She has diffuse abdominal pain.  She denies eating anything different or being around anyone else who is ill.  Patient is very dramatic and graphically heaving and crying.  When I listen to her abdomen her abdomen has diminished bowel sounds.   The patient appears stable so that the remainder of the MSE may be completed by another provider.   Rolland Porter, MD 12/07/18 [phone]

## 2018-12-07 NOTE — ED Notes (Signed)
Pt insisting she had to have ice chips explained not a good idea due to continued Nausea. Per dr Reather Converse. May have ice. Pt is currently wretching and vomiting

## 2018-12-07 NOTE — ED Provider Notes (Signed)
[facility] EMERGENCY DEPARTMENT Provider Note   CSN: [id number] Arrival date & time: 12/07/18  0536    History   Chief Complaint Chief Complaint  Patient presents with   Abdominal Pain    HPI Debbie Santana is a 28 y.o. female.     Patient with history of chronic back pain, gastric ulcer, erosive gastritis presents with recurrent abdominal pain and vomiting since early this morning.  No sick contacts, no travel, no current antibiotics and no blood in the stools.  Patient's had recurrent vomiting and diarrhea without improvement.  Patient had laser ablation of the cervix by Dr. Elonda Husky on the 11th of this month.     Past Medical History:  Diagnosis Date   Anxiety    Bradycardia 04/17/2017   Chronic back pain    Erosive gastritis 04/11/2017   Gastric ulcer    Genital warts    History of kidney stones    Peptic ulcer disease 04/13/2017   Sciatica    Vaginal Pap smear, abnormal     Patient Active Problem List   Diagnosis Date Noted   Nausea & vomiting 12/07/2018   Postpartum hypertension 06/23/2018   History of gestational hypertension 05/21/2018   Condyloma 04/15/2018   History of abnormal cervical Pap smear 11/04/2017   Cigarette smoker 04/26/2017   Reflux esophagitis 04/17/2017   Obesity 04/17/2017   Tobacco abuse 04/17/2017   Peptic ulcer disease 04/13/2017   Erosive gastritis 04/11/2017   Intractable abdominal pain 04/11/2017   Hyperglycemia 04/11/2017   Anxiety 04/11/2017    Past Surgical History:  Procedure Laterality Date   BACK SURGERY     ruptured disc.   BIOPSY  04/14/2017   Procedure: BIOPSY;  Surgeon: Rogene Houston, MD;  Location: [facility];  Service: Endoscopy;;  gastric   CERVICAL ABLATION N/A 12/02/2018   Procedure: LASER ABLATION OF CERVIX;  Surgeon: Florian Buff, MD;  Location: [facility] ORS;  Service: Gynecology;  Laterality: N/A;   CHOLECYSTECTOMY N/A 04/17/2017   Procedure: LAPAROSCOPIC CHOLECYSTECTOMY;   Surgeon: Aviva Signs, MD;  Location: [facility] ORS;  Service: General;  Laterality: N/A;   COLPOSCOPY     ESOPHAGOGASTRODUODENOSCOPY (EGD) WITH PROPOFOL N/A 04/14/2017   Procedure: ESOPHAGOGASTRODUODENOSCOPY (EGD) WITH PROPOFOL;  Surgeon: Rogene Houston, MD;  Location: [facility];  Service: Endoscopy;  Laterality: N/A;   HERNIA REPAIR Bilateral    inguinal   LASER ABLATION CONDOLAMATA N/A 12/02/2018   Procedure: LASER ABLATION CONDYLOMA ACCUMINATA LEFT AND RIGHT VULVA, PERINEUM AND PERIANAL (15 TOTAL);  Surgeon: Florian Buff, MD;  Location: [facility] ORS;  Service: Gynecology;  Laterality: N/A;     OB History    Gravida  2   Para  1   Term  1   Preterm      AB  1   Living  1     SAB  1   TAB      Ectopic      Multiple  0   Live Births  1            Home Medications    Prior to Admission medications   Medication Sig Start Date End Date Taking? Authorizing Provider  Aspirin-Acetaminophen-Caffeine (GOODYS EXTRA STRENGTH) [id number] MG PACK Take 1 packet by mouth daily as needed (pain.).    [provider]  cyclobenzaprine (FLEXERIL) 10 MG tablet Take 1 tablet (10 mg total) by mouth 3 (three) times daily. 12/05/18   Lily Kocher, PA-C  dexamethasone (DECADRON) 4 MG  tablet Take 1 tablet (4 mg total) by mouth 2 (two) times daily with a meal. 12/05/18   Lily Kocher, PA-C  diphenhydrAMINE (BENADRYL) 25 MG tablet Take 25 mg by mouth 2 (two) times daily as needed (headaches/allergies.).    [provider]  HYDROcodone-acetaminophen (NORCO/VICODIN) 5-325 MG tablet Take 1 tablet by mouth every 6 (six) hours as needed. 12/02/18   Florian Buff, MD  ketorolac (TORADOL) 10 MG tablet Take 1 tablet (10 mg total) by mouth every 8 (eight) hours as needed. 12/02/18   Florian Buff, MD  silver sulfADIAZINE (SILVADENE) 1 % cream Apply 3 times a day 12/02/18   Florian Buff, MD    Family History Family History  Problem Relation Age of Onset   Anxiety disorder  Mother    Hyperlipidemia Mother    Diabetes Maternal Grandfather    Diabetes Cousin    Learning disabilities Cousin     Social History Social History   Tobacco Use   Smoking status: Current Every Day Smoker    Packs/day: 0.50    Years: 5.00    Pack years: 2.50    Types: Cigarettes   Smokeless tobacco: Never Used  Substance Use Topics   Alcohol use: No   Drug use: No     Allergies   Patient has no known allergies.   Review of Systems Review of Systems  Constitutional: Negative for chills and fever.  HENT: Negative for congestion.   Eyes: Negative for visual disturbance.  Respiratory: Negative for shortness of breath.   Cardiovascular: Negative for chest pain.  Gastrointestinal: Positive for abdominal pain, nausea and vomiting.  Genitourinary: Negative for dysuria and flank pain.  Musculoskeletal: Negative for back pain, neck pain and neck stiffness.  Skin: Negative for rash.  Neurological: Positive for light-headedness. Negative for headaches.     Physical Exam Updated Vital Signs BP 130/80    Pulse (!) 53    Temp 97.7 F (36.5 C) (Oral)    Resp 18    Ht 5\' 9"  (1.753 m)    Wt 107.5 kg    LMP 11/19/2018 (Approximate)    SpO2 100%    BMI 35.00 kg/m   Physical Exam Vitals signs and nursing note reviewed.  Constitutional:      Appearance: She is well-developed.  HENT:     Head: Normocephalic and atraumatic.     Comments: Dry mm Eyes:     General:        Right eye: No discharge.        Left eye: No discharge.     Conjunctiva/sclera: Conjunctivae normal.  Neck:     Musculoskeletal: Normal range of motion and neck supple.     Trachea: No tracheal deviation.  Cardiovascular:     Rate and Rhythm: Normal rate and regular rhythm.  Pulmonary:     Effort: Pulmonary effort is normal.     Breath sounds: Normal breath sounds.  Abdominal:     General: There is no distension.     Palpations: Abdomen is soft.     Tenderness: There is generalized abdominal  tenderness. There is no guarding.  Skin:    General: Skin is warm.     Findings: No rash.  Neurological:     Mental Status: She is alert and oriented to person, place, and time.      ED Treatments / Results  Labs (all labs ordered are listed, but only abnormal results are displayed) Labs Reviewed  CBC WITH DIFFERENTIAL/PLATELET -  Abnormal; Notable for the following components:      Result Value   WBC 25.2 (*)    Hemoglobin 15.7 (*)    Neutro Abs 21.6 (*)    Monocytes Absolute 1.3 (*)    Abs Immature Granulocytes 0.20 (*)    All other components within normal limits  COMPREHENSIVE METABOLIC PANEL - Abnormal; Notable for the following components:   Glucose, Bld 145 (*)    Creatinine, Ser 1.05 (*)    All other components within normal limits  URINALYSIS, ROUTINE W REFLEX MICROSCOPIC - Abnormal; Notable for the following components:   APPearance HAZY (*)    Hgb urine dipstick LARGE (*)    Leukocytes,Ua LARGE (*)    WBC, UA >50 (*)    Bacteria, UA RARE (*)    All other components within normal limits  URINE CULTURE  LIPASE, BLOOD  PREGNANCY, URINE    EKG None  Radiology Ct Abdomen Pelvis W Contrast  Result Date: 12/07/2018 CLINICAL DATA:  Abdominal pain for several hours EXAM: CT ABDOMEN AND PELVIS WITH CONTRAST TECHNIQUE: Multidetector CT imaging of the abdomen and pelvis was performed using the standard protocol following bolus administration of intravenous contrast. CONTRAST:  175mL OMNIPAQUE IOHEXOL 300 MG/ML  SOLN COMPARISON:  Plain film from earlier in the same day. FINDINGS: Lower chest: No acute abnormality. Hepatobiliary: No focal liver abnormality is seen. Status post cholecystectomy. No biliary dilatation. Pancreas: Unremarkable. No pancreatic ductal dilatation or surrounding inflammatory changes. Spleen: Normal in size without focal abnormality. Adrenals/Urinary Tract: Adrenal glands are within normal limits bilaterally. Kidneys are well visualized bilaterally  without renal calculi. No obstructive changes are seen. Bladder is partially distended. Stomach/Bowel: The appendix is within normal limits. The colon is decompressed. No significant retained fecal material is noted. No obstructive or inflammatory changes are seen. Vascular/Lymphatic: No significant vascular findings are present. No enlarged abdominal or pelvic lymph nodes. Reproductive: Uterus and bilateral adnexa are unremarkable. Other: No abdominal wall hernia or abnormality. No abdominopelvic ascites. Musculoskeletal: No acute or significant osseous findings. IMPRESSION: No acute abnormality noted. Electronically Signed   By: Inez Catalina M.D.   On: 12/07/2018 11:11   Dg Abd 2 Views  Result Date: 12/07/2018 CLINICAL DATA:  Abdominal pain and vomiting for 5 hours EXAM: ABDOMEN - 2 VIEW COMPARISON:  04/26/2017 FINDINGS: Unremarkable bowel gas pattern. No concerning mass effect or calcification. Cholecystectomy clips. Lung bases are clear. No osseous findings. IMPRESSION: Negative. Electronically Signed   By: Monte Fantasia M.D.   On: 12/07/2018 07:27    Procedures Procedures (including critical care time)  Medications Ordered in ED Medications  ondansetron (ZOFRAN) injection 4 mg (has no administration in time range)  sodium chloride 0.9 % bolus 1,000 mL (0 mLs Intravenous Stopped 12/07/18 1137)  sodium chloride 0.9 % bolus 500 mL (0 mLs Intravenous Stopped 12/07/18 1137)  ondansetron (ZOFRAN) injection 4 mg (4 mg Intravenous Given 12/07/18 0645)  dicyclomine (BENTYL) injection 20 mg (20 mg Intramuscular Given 12/07/18 0654)  metoCLOPramide (REGLAN) injection 10 mg (10 mg Intravenous Given 12/07/18 0648)  LORazepam (ATIVAN) injection 1 mg (1 mg Intravenous Given 12/07/18 0646)  ondansetron (ZOFRAN) injection 4 mg (4 mg Intravenous Given 12/07/18 0815)  fentaNYL (SUBLIMAZE) injection 50 mcg (50 mcg Intravenous Given 12/07/18 0906)  iohexol (OMNIPAQUE) 300 MG/ML solution 100 mL (100 mLs Intravenous  Contrast Given 12/07/18 1046)  prochlorperazine (COMPAZINE) injection 10 mg (10 mg Intravenous Given 12/07/18 1112)  cefTRIAXone (ROCEPHIN) 1 g in sodium chloride 0.9 % 100 mL IVPB (  1 g Intravenous New Bag/Given 12/07/18 1136)  morphine 4 MG/ML injection 4 mg (4 mg Intravenous Given 12/07/18 1132)     Initial Impression / Assessment and Plan / ED Course  I have reviewed the triage vital signs and the nursing notes.  Pertinent labs & imaging results that were available during my care of the patient were reviewed by me and considered in my medical decision making (see chart for details).       Patient presents with recurrent vomiting nausea and diarrhea since this morning likely viral in origin.  Patient has significant leukocytosis 25,000 which is combination of infection/vomiting/being on prednisone.  With nonfocal abdominal pain, significant leukocytosis and difficulty controlling symptoms CT scan of the abdomen pelvis ordered for further delineation.  Patient required multiple dose of IV antiemetics, pain meds ordered.  IV fluids going.  Remainder of blood work showed elevated Cr3 1.05 likely secondary to dehydration. UA pending.  Patient unable to keep fluids down.  Concern for possible pyelonephritis with back pain, leukocytosis, concerning urine.  Culture sent.  Discussed with hospitalist for admission/observation.  Recurrent medications given.  Rocephin ordered.  Final Clinical Impressions(s) / ED Diagnoses   Final diagnoses:  Nausea vomiting and diarrhea  Abdominal pain, generalized    ED Discharge Orders    None       Elnora Morrison, MD 12/07/18 1344

## 2018-12-07 NOTE — ED Notes (Signed)
Pt to room and having diarrhea.

## 2018-12-07 NOTE — Plan of Care (Signed)

## 2018-12-07 NOTE — ED Notes (Signed)
ED TO INPATIENT HANDOFF REPORT  ED Nurse Name and Phone #: Shirlean Mylar [phone]  S Name/Age/Gender Debbie Santana 28 y.o. female Room/Bed: [facility]08/[facility]08  Code Status   Code Status: Prior  Home/SNF/Other Home Patient oriented to: self Is this baseline? Yes   Triage Complete: Triage complete  Chief Complaint Abdominal Pain  Triage Note Pt c/o abdominal pain and vomiting x 5 hours and states she has not had a BM in 5 days   Allergies No Known Allergies  Level of Care/Admitting Diagnosis ED Disposition    ED Disposition Condition [facility]: [facility] [[id number]]  Level of Care: Med-Surg [16]  Diagnosis: Nausea & vomiting [865784]  Admitting Physician: Kathie Dike [[id number]]  Attending Physician: Kathie Dike [[id number]]  PT Class (Do Not Modify): Observation [104]  PT Acc Code (Do Not Modify): Observation [10022]       B Medical/Surgery History Past Medical History:  Diagnosis Date  . Anxiety   . Bradycardia 04/17/2017  . Chronic back pain   . 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 Gastric ulcer   . Genital warts   . History of kidney stones   . Peptic ulcer disease 04/13/2017  . Sciatica   . Vaginal Pap smear, abnormal    Past Surgical History:  Procedure Laterality Date  . BACK SURGERY     ruptured disc.  . BIOPSY  04/14/2017   Procedure: BIOPSY;  Surgeon: Rogene Houston, MD;  [facility];  Service: Endoscopy;;  gastric  . CERVICAL ABLATION N/A 12/02/2018   Procedure: LASER ABLATION OF CERVIX;  Surgeon: Florian Buff, MD;  Location: [facility] OR[facility];  Service: Gynecology;  Laterality: N/A;  . CHOLECYSTECTOMY N/A 04/17/2017   Procedure: LAPAROSCOPIC CHOLECYSTECTOMY;  Surgeon: Aviva Signs, MD;  Location: [facility];  Service: General;  Laterality: N/A;  . COLPOSCOPY    . ESOPHAGOGASTRODUODENOSCOPY (EGD) WITH PROPOFOL N/A 04/14/2017   Procedure: ESOPHAGOGASTRODUODENOSCOPY (EGD) WITH PROPOFOL;  Surgeon: Rogene Houston, MD;  [facility];   Service: Endoscopy;  Laterality: N/A;  . HERNIA REPAIR Bilateral    inguinal  . LASER ABLATION CONDOLAMATA N/A 12/02/2018   Procedure: LASER ABLATION CONDYLOMA ACCUMINATA LEFT AND RIGHT VULVA, PERINEUM AND PERIANAL (15 TOTAL);  Surgeon: Florian Buff, MD;  Location: [facility];  Service: Gynecology;  Laterality: N/A;     A IV Location/Drains/Wounds Patient Lines/Drains/Airways Status   Active Line/Drains/Airways    Name:   Placement date:   Placement time:   Site:   Days:   Peripheral IV 12/07/18 Left Antecubital   12/07/18    0622    Antecubital   less than 1   Incision (Closed) 04/17/17 Abdomen   04/17/17    1320     599   Incision (Closed) 12/02/18 Other (Comment) Other (Comment)   12/02/18    0800     5   Incision - 4 Ports Abdomen Umbilicus Lateral;Mid;Left Lateral;Left;Upper Mid;Upper   04/17/17    1330     599          Intake/Output Last 24 hours No intake or output data in the 24 hours ending 12/07/18 1405  Labs/Imaging Results for orders placed or performed during the hospital encounter of 12/07/18 (from the past 48 hour(s))  CBC with Differential     Status: Abnormal   Collection Time: 12/07/18  6:07 AM  Result Value Ref Range   WBC 25.2 (H) 4.0 - 10.5 K/uL   RBC 5.10 3.87 -  5.11 MIL/uL   Hemoglobin 15.7 (H) 12.0 - 15.0 g/dL   HCT 45.5 36.0 - 46.0 %   MCV 89.2 80.0 - 100.0 fL   MCH 30.8 26.0 - 34.0 pg   MCHC 34.5 30.0 - 36.0 g/dL   RDW 12.4 11.5 - 15.5 %   Platelets 309 150 - 400 K/uL   nRBC 0.0 0.0 - 0.2 %   Neutrophils Relative % 86 %   Neutro Abs 21.6 (H) 1.7 - 7.7 K/uL   Lymphocytes Relative 8 %   Lymphs Abs 2.1 0.7 - 4.0 K/uL   Monocytes Relative 5 %   Monocytes Absolute 1.3 (H) 0.1 - 1.0 K/uL   Eosinophils Relative 0 %   Eosinophils Absolute 0.0 0.0 - 0.5 K/uL   Basophils Relative 0 %   Basophils Absolute 0.1 0.0 - 0.1 K/uL   Immature Granulocytes 1 %   Abs Immature Granulocytes 0.20 (H) 0.00 - 0.07 K/uL    Comment: Performed at [facility], [address] [place], [place] [address]  Comprehensive metabolic panel     Status: Abnormal   Collection Time: 12/07/18  6:07 AM  Result Value Ref Range   Sodium 138 135 - 145 mmol/L   Potassium 3.7 3.5 - 5.1 mmol/L   Chloride 103 98 - 111 mmol/L   CO2 23 22 - 32 mmol/L   Glucose, Bld 145 (H) 70 - 99 mg/dL   BUN 14 6 - 20 mg/dL   Creatinine, Ser 1.05 (H) 0.44 - 1.00 mg/dL   Calcium 9.7 8.9 - 10.3 mg/dL   Total Protein 7.6 6.5 - 8.1 g/dL   Albumin 4.1 3.5 - 5.0 g/dL   AST 20 15 - 41 U/L   ALT 25 0 - 44 U/L   Alkaline Phosphatase 87 38 - 126 U/L   Total Bilirubin 0.6 0.3 - 1.2 mg/dL   GFR calc non Af Amer >60 >60 mL/min   GFR calc Af Amer >60 >60 mL/min   Anion gap 12 5 - 15    Comment: Performed at [facility], [address], [place], [place] [address]  Urinalysis, Routine w reflex microscopic     Status: Abnormal   Collection Time: 12/07/18  6:07 AM  Result Value Ref Range   Color, Urine YELLOW YELLOW   APPearance HAZY (A) CLEAR   Specific Gravity, Urine 1.023 1.005 - 1.030   pH 7.0 5.0 - 8.0   Glucose, UA NEGATIVE NEGATIVE mg/dL   Hgb urine dipstick LARGE (A) NEGATIVE   Bilirubin Urine NEGATIVE NEGATIVE   Ketones, ur NEGATIVE NEGATIVE mg/dL   Protein, ur NEGATIVE NEGATIVE mg/dL   Nitrite NEGATIVE NEGATIVE   Leukocytes,Ua LARGE (A) NEGATIVE   RBC / HPF 11-20 0 - 5 RBC/hpf   WBC, UA >50 (H) 0 - 5 WBC/hpf   Bacteria, UA RARE (A) NONE SEEN   Squamous Epithelial / LPF 11-20 0 - 5   Mucus PRESENT    Uric Acid Crys, UA PRESENT     Comment: Performed at [facility], [address], [place], [place] [address]  Lipase, blood     Status: None   Collection Time: 12/07/18  6:07 AM  Result Value Ref Range   Lipase 20 11 - 51 U/L    Comment: Performed at [facility], [address] [place], [place] [address]  Pregnancy, urine     Status: None   Collection Time: 12/07/18  9:00 AM  Result Value Ref Range   Preg Test, Ur NEGATIVE  NEGATIVE    Comment:        THE SENSITIVITY OF  THIS METHODOLOGY IS >20 mIU/mL. Performed at [facility], [address], [place], [place] [address]    Ct Abdomen Pelvis W Contrast  Result Date: 12/07/2018 CLINICAL DATA:  Abdominal pain for several hours EXAM: CT ABDOMEN AND PELVIS WITH CONTRAST TECHNIQUE: Multidetector CT imaging of the abdomen and pelvis was performed using the standard protocol following bolus administration of intravenous contrast. CONTRAST:  170mL OMNIPAQUE IOHEXOL 300 MG/ML  SOLN COMPARISON:  Plain film from earlier in the same day. FINDINGS: Lower chest: No acute abnormality. Hepatobiliary: No focal liver abnormality is seen. Status post cholecystectomy. No biliary dilatation. Pancreas: Unremarkable. No pancreatic ductal dilatation or surrounding inflammatory changes. Spleen: Normal in size without focal abnormality. Adrenals/Urinary Tract: Adrenal glands are within normal limits bilaterally. Kidneys are well visualized bilaterally without renal calculi. No obstructive changes are seen. Bladder is partially distended. Stomach/Bowel: The appendix is within normal limits. The colon is decompressed. No significant retained fecal material is noted. No obstructive or inflammatory changes are seen. Vascular/Lymphatic: No significant vascular findings are present. No enlarged abdominal or pelvic lymph nodes. Reproductive: Uterus and bilateral adnexa are unremarkable. Other: No abdominal wall hernia or abnormality. No abdominopelvic ascites. Musculoskeletal: No acute or significant osseous findings. IMPRESSION: No acute abnormality noted. Electronically Signed   By: Inez Catalina M.D.   On: 12/07/2018 11:11   Dg Abd 2 Views  Result Date: 12/07/2018 CLINICAL DATA:  Abdominal pain and vomiting for 5 hours EXAM: ABDOMEN - 2 VIEW COMPARISON:  04/26/2017 FINDINGS: Unremarkable bowel gas pattern. No concerning mass effect or calcification. Cholecystectomy clips. Lung bases are clear. No osseous findings. IMPRESSION: Negative. Electronically  Signed   By: Monte Fantasia M.D.   On: 12/07/2018 07:27    Pending Labs Unresulted Labs (From admission, onward)    Start     Ordered   12/07/18 1129  Urine culture  ONCE - STAT,   STAT     12/07/18 1128          Vitals/Pain Today's Vitals   12/07/18 0805 12/07/18 0817 12/07/18 1030 12/07/18 1100  BP:  123/72 128/68 130/80  Pulse:  (!) 52 (!) 47 (!) 53  Resp:  18    Temp:      TempSrc:      SpO2:  100% 100% 100%  Weight:      Height:      PainSc: 7        Isolation Precautions No active isolations  Medications Medications  ondansetron (ZOFRAN) injection 4 mg (has no administration in time range)  sodium chloride 0.9 % bolus 1,000 mL (0 mLs Intravenous Stopped 12/07/18 1137)  sodium chloride 0.9 % bolus 500 mL (0 mLs Intravenous Stopped 12/07/18 1137)  ondansetron (ZOFRAN) injection 4 mg (4 mg Intravenous Given 12/07/18 0645)  dicyclomine (BENTYL) injection 20 mg (20 mg Intramuscular Given 12/07/18 0654)  metoCLOPramide (REGLAN) injection 10 mg (10 mg Intravenous Given 12/07/18 0648)  LORazepam (ATIVAN) injection 1 mg (1 mg Intravenous Given 12/07/18 0646)  ondansetron (ZOFRAN) injection 4 mg (4 mg Intravenous Given 12/07/18 0815)  fentaNYL (SUBLIMAZE) injection 50 mcg (50 mcg Intravenous Given 12/07/18 0906)  iohexol (OMNIPAQUE) 300 MG/ML solution 100 mL (100 mLs Intravenous Contrast Given 12/07/18 1046)  prochlorperazine (COMPAZINE) injection 10 mg (10 mg Intravenous Given 12/07/18 1112)  cefTRIAXone (ROCEPHIN) 1 g in sodium chloride 0.9 % 100 mL IVPB (0 g Intravenous Stopped 12/07/18 1206)  morphine 4 MG/ML  injection 4 mg (4 mg Intravenous Given 12/07/18 1132)    Mobility walks Low fall risk(Simultaneous filing. User may not have seen previous data.)   Focused Assessments    R Recommendations: See Admitting Provider Note  Report given to:   Additional Notes:

## 2018-12-07 NOTE — ED Notes (Signed)
Pt vomited approx 100 ml of bile like substance

## 2018-12-07 NOTE — ED Triage Notes (Signed)
Pt c/o abdominal pain and vomiting x 5 hours and states she has not had a BM in 5 days

## 2018-12-07 NOTE — Progress Notes (Deleted)
History and Physical    [name]:[id number] DOB: Mar 24, 1991 DOA: 12/07/2018  PCP: [facility] [place]  Patient coming from: Home  I have personally briefly reviewed patient's old medical records in [place]  Chief Complaint: Lower back pain, nausea and vomiting  HPI: Debbie Santana is a 28 y.o. female with medical history significant of previous back pain and back surgery, gastric ulcer, who recently had an laser ablation of the cervix by gynecology on 3/11, presents to the hospital with complaints of worsening back pain, nausea and vomiting.  She reports that her back pain has been present since her procedure.  She reports difficulty moving her legs due to pain.  She has not had any urinary or bowel incontinence.  She denies any numbness or tingling in her lower extremities.  She denies any trauma.  She reports that she has not had a bowel movement in over a week.  Yesterday, she began having loose stools and associated vomiting.  She describes a crampy pain in her lower abdomen.  Stools were loose in color, brown and nonbloody.  She describes vomit as yellow/green in color.  She had minimal p.o. intake.  She is unsure whether she is to have any fever, but does report chills.  She has not had any dysuria.  She was recently seen in the emergency room a few days ago and was diagnosed with bronchitis.  She also had back pain at that time.  This was felt to be a chronic issue.  She received steroids for bronchitis  ED Course: She is noted to have an elevated WBC count of 25,000.  She is afebrile.  She is clinically somewhat dehydrated.  The remainder of her labs are relatively unrevealing.  CT scan of the abdomen pelvis does not show any acute findings.  She had multiple episodes of retching and vomiting in the emergency room and was unable to keep anything down.  It was not felt that she would be able to manage at home in her current state.  Admission has been  requested.  Review of Systems:  GI: Positive for abdominal pain, nausea, vomiting, negative for hematemesis, hematochezia, melena GU.  Negative for dysuria Musculoskeletal.  Positive for lower back pain All other systems reviewed and found to be negative   Past Medical History:  Diagnosis Date   Anxiety    Bradycardia 04/17/2017   Chronic back pain    Erosive gastritis 04/11/2017   Gastric ulcer    Genital warts    History of kidney stones    Peptic ulcer disease 04/13/2017   Sciatica    Vaginal Pap smear, abnormal     Past Surgical History:  Procedure Laterality Date   BACK SURGERY     ruptured disc.   BIOPSY  04/14/2017   Procedure: BIOPSY;  Surgeon: Rogene Houston, MD;  [facility];  Service: Endoscopy;;  gastric   CERVICAL ABLATION N/A 12/02/2018   Procedure: LASER ABLATION OF CERVIX;  Surgeon: Florian Buff, MD;  Location: [facility];  Service: Gynecology;  Laterality: N/A;   CHOLECYSTECTOMY N/A 04/17/2017   Procedure: LAPAROSCOPIC CHOLECYSTECTOMY;  Surgeon: Aviva Signs, MD;  Location: [facility];  Service: General;  Laterality: N/A;   COLPOSCOPY     ESOPHAGOGASTRODUODENOSCOPY (EGD) WITH PROPOFOL N/A 04/14/2017   Procedure: ESOPHAGOGASTRODUODENOSCOPY (EGD) WITH PROPOFOL;  Surgeon: Rogene Houston, MD;  [facility]: [facility];  Service: Endoscopy;  Laterality: N/A;   HERNIA REPAIR Bilateral  inguinal   LASER ABLATION CONDOLAMATA N/A 12/02/2018   Procedure: LASER ABLATION CONDYLOMA ACCUMINATA LEFT AND RIGHT VULVA, PERINEUM AND PERIANAL (15 TOTAL);  Surgeon: Florian Buff, MD;  Location: [facility];  Service: Gynecology;  Laterality: N/A;    Social History:  reports that she has been smoking cigarettes. She has a 2.50 pack-year smoking history. She has never used smokeless tobacco. She reports that she does not drink alcohol or use drugs.  No Known Allergies  Family History  Problem Relation Age of Onset   Anxiety disorder Mother     Hyperlipidemia Mother    Diabetes Maternal Grandfather    Diabetes Cousin    Learning disabilities Cousin     Prior to Admission medications   Medication Sig Start Date End Date Taking? Authorizing Provider  cyclobenzaprine (FLEXERIL) 10 MG tablet Take 1 tablet (10 mg total) by mouth 3 (three) times daily. 12/05/18  Yes Lily Kocher, PA-C  dexamethasone (DECADRON) 4 MG tablet Take 1 tablet (4 mg total) by mouth 2 (two) times daily with a meal. 12/05/18  Yes Lily Kocher, PA-C  dimenhyDRINATE (DRAMAMINE) 50 MG tablet Take 50 mg by mouth as needed.   Yes [provider]  diphenhydrAMINE (BENADRYL) 25 MG tablet Take 25 mg by mouth 2 (two) times daily as needed (headaches/allergies.).   Yes [provider]  HYDROcodone-acetaminophen (NORCO/VICODIN) 5-325 MG tablet Take 1 tablet by mouth every 6 (six) hours as needed. 12/02/18  Yes Florian Buff, MD  ketorolac (TORADOL) 10 MG tablet Take 1 tablet (10 mg total) by mouth every 8 (eight) hours as needed. 12/02/18  Yes Florian Buff, MD  silver sulfADIAZINE (SILVADENE) 1 % cream Apply 3 times a day 12/02/18  Yes Florian Buff, MD    Physical Exam: Vitals:   12/07/18 1030 12/07/18 1100 12/07/18 1423 12/07/18 1449  BP: 128/68 130/80 108/62 122/72  Pulse: (!) 47 (!) 53 (!) 59 (!) 54  Resp:   16 16  Temp:   98.2 F (36.8 C) (!) 96.1 F (35.6 C)  TempSrc:   Oral Oral  SpO2: 100% 100% 100% 98%  Weight:    106.9 kg  Height:    5\' 9"  (1.753 m)    Constitutional: NAD, calm, comfortable Eyes: PERRL, lids and conjunctivae normal ENMT: Mucous membranes are moist. Posterior pharynx clear of any exudate or lesions.Normal dentition.  Neck: normal, supple, no masses, no thyromegaly Respiratory: clear to auscultation bilaterally, no wheezing, no crackles. Normal respiratory effort. No accessory muscle use.  Cardiovascular: Regular rate and rhythm, no murmurs / rubs / gallops. No extremity edema. 2+ pedal pulses. No carotid bruits.   Abdomen: no tenderness, no masses palpated. No hepatosplenomegaly. Bowel sounds positive.  Musculoskeletal: no clubbing / cyanosis. No joint deformity upper and lower extremities. Good ROM, no contractures. Normal muscle tone.  Skin: no rashes, lesions, ulcers. No induration Neurologic: CN 2-12 grossly intact. Sensation intact, DTR normal. Strength 5/5 in all 4.  Straight leg test positive on bilateral lower extremities Psychiatric: Normal judgment and insight. Alert and oriented x 3. Normal mood.    Labs on Admission: I have personally reviewed following labs and imaging studies  CBC: Recent Labs  Lab 12/07/18 0607  WBC 25.2*  NEUTROABS 21.6*  HGB 15.7*  HCT 45.5  MCV 89.2  PLT 734   Basic Metabolic Panel: Recent Labs  Lab 12/07/18 0607  NA 138  K 3.7  CL 103  CO2 23  GLUCOSE 145*  BUN 14  CREATININE  1.05*  CALCIUM 9.7   GFR: Estimated Creatinine Clearance: 103.9 mL/min (A) (by C-G formula based on SCr of 1.05 mg/dL (H)). Liver Function Tests: Recent Labs  Lab 12/07/18 0607  AST 20  ALT 25  ALKPHOS 87  BILITOT 0.6  PROT 7.6  ALBUMIN 4.1   Recent Labs  Lab 12/07/18 0607  LIPASE 20   No results for input(s): AMMONIA in the last 168 hours. Coagulation Profile: No results for input(s): INR, PROTIME in the last 168 hours. Cardiac Enzymes: No results for input(s): CKTOTAL, CKMB, CKMBINDEX, TROPONINI in the last 168 hours. BNP (last 3 results) No results for input(s): PROBNP in the last 8760 hours. HbA1C: No results for input(s): HGBA1C in the last 72 hours. CBG: No results for input(s): GLUCAP in the last 168 hours. Lipid Profile: No results for input(s): CHOL, HDL, LDLCALC, TRIG, CHOLHDL, LDLDIRECT in the last 72 hours. Thyroid Function Tests: No results for input(s): TSH, T4TOTAL, FREET4, T3FREE, THYROIDAB in the last 72 hours. Anemia Panel: No results for input(s): VITAMINB12, FOLATE, FERRITIN, TIBC, IRON, RETICCTPCT in the last 72 hours. Urine  analysis:    Component Value Date/Time   COLORURINE YELLOW 12/07/2018 0607   APPEARANCEUR HAZY (A) 12/07/2018 0607   APPEARANCEUR Clear 11/04/2017 1159   LABSPEC 1.023 12/07/2018 0607   PHURINE 7.0 12/07/2018 0607   GLUCOSEU NEGATIVE 12/07/2018 0607   HGBUR LARGE (A) 12/07/2018 0607   BILIRUBINUR NEGATIVE 12/07/2018 0607   BILIRUBINUR Negative 11/04/2017 1159   Garza 12/07/2018 0607   PROTEINUR NEGATIVE 12/07/2018 0607   UROBILINOGEN 0.2 05/14/2015 0028   NITRITE NEGATIVE 12/07/2018 0607   LEUKOCYTESUR LARGE (A) 12/07/2018 0607    Radiological Exams on Admission: Mr Lumbar Spine Wo Contrast  Result Date: 12/07/2018 CLINICAL DATA:  Low back pain radiating down both arms for 2 months EXAM: MRI LUMBAR SPINE WITHOUT CONTRAST TECHNIQUE: Multiplanar, multisequence MR imaging of the lumbar spine was performed. No intravenous contrast was administered. COMPARISON:  None. FINDINGS: Segmentation:  Standard. Alignment:  Physiologic. Vertebrae:  No fracture, evidence of discitis, or bone lesion. Conus medullaris and cauda equina: Conus extends to the 1 level. Conus and cauda equina appear normal. Paraspinal and other soft tissues: No acute paraspinal abnormality. Disc levels: Disc spaces: Degenerative disc disease with disc height loss at L4-5 and L5-S1. Disc desiccation at L2-3, L3-4. T12-L1: No significant disc bulge. No evidence of neural foraminal stenosis. No central canal stenosis. L1-L2: No significant disc bulge. No evidence of neural foraminal stenosis. No central canal stenosis. L2-L3: Broad-based disc bulge. No evidence of neural foraminal stenosis. No central canal stenosis. L3-L4: Broad central/right paracentral disc protrusion. Right lateral recess narrowing. No evidence of neural foraminal stenosis. No central canal stenosis. L4-L5: Broad central disc protrusion. Mild bilateral lateral recess narrowing. No evidence of neural foraminal stenosis. No central canal stenosis. L5-S1:  Broad-based disc bulge. Mild right facet arthropathy. No evidence of neural foraminal stenosis. No central canal stenosis. IMPRESSION: 1. At L3-4 there is a broad central/right paracentral disc protrusion. Right lateral recess narrowing. 2. At L4-5 there is a broad central disc protrusion. Mild bilateral lateral recess narrowing. Electronically Signed   By: Kathreen Devoid   On: 12/07/2018 18:05   Ct Abdomen Pelvis W Contrast  Result Date: 12/07/2018 CLINICAL DATA:  Abdominal pain for several hours EXAM: CT ABDOMEN AND PELVIS WITH CONTRAST TECHNIQUE: Multidetector CT imaging of the abdomen and pelvis was performed using the standard protocol following bolus administration of intravenous contrast. CONTRAST:  167mL OMNIPAQUE IOHEXOL  300 MG/ML  SOLN COMPARISON:  Plain film from earlier in the same day. FINDINGS: Lower chest: No acute abnormality. Hepatobiliary: No focal liver abnormality is seen. Status post cholecystectomy. No biliary dilatation. Pancreas: Unremarkable. No pancreatic ductal dilatation or surrounding inflammatory changes. Spleen: Normal in size without focal abnormality. Adrenals/Urinary Tract: Adrenal glands are within normal limits bilaterally. Kidneys are well visualized bilaterally without renal calculi. No obstructive changes are seen. Bladder is partially distended. Stomach/Bowel: The appendix is within normal limits. The colon is decompressed. No significant retained fecal material is noted. No obstructive or inflammatory changes are seen. Vascular/Lymphatic: No significant vascular findings are present. No enlarged abdominal or pelvic lymph nodes. Reproductive: Uterus and bilateral adnexa are unremarkable. Other: No abdominal wall hernia or abnormality. No abdominopelvic ascites. Musculoskeletal: No acute or significant osseous findings. IMPRESSION: No acute abnormality noted. Electronically Signed   By: Inez Catalina M.D.   On: 12/07/2018 11:11   Dg Abd 2 Views  Result Date:  12/07/2018 CLINICAL DATA:  Abdominal pain and vomiting for 5 hours EXAM: ABDOMEN - 2 VIEW COMPARISON:  04/26/2017 FINDINGS: Unremarkable bowel gas pattern. No concerning mass effect or calcification. Cholecystectomy clips. Lung bases are clear. No osseous findings. IMPRESSION: Negative. Electronically Signed   By: Monte Fantasia M.D.   On: 12/07/2018 07:27    Assessment/Plan Active Problems:   Leukocytosis   Tobacco abuse   Nausea & vomiting   Low back pain   Gastroenteritis   Acute lower UTI   Dehydration     1. Nausea and vomiting.  Unclear etiology.  Possibly gastroenteritis.  CT of the abdomen pelvis is unrevealing for any bowel pathology.  Treat supportively with antiemetics, PPI, antispasmodics, IV fluids.  We will keep the patient n.p.o. for now and advance diet as tolerated. 2. Leukocytosis.  Suspect this is related to steroids as well as stress response from vomiting.  She does not appear to be septic.  We will continue to monitor. 3. Low back pain.  Patient reports that this is limiting and has significant pain.  She has had prior surgery before, approximately 10 years ago.  Will request MRI of the lumbar spine to further evaluate her lumbar anatomy. 4. Possible urinary tract infection.  Urinalysis indicates pyuria, but this may be dehydration.  She is been started on antibiotics.  Follow-up culture. 5. Dehydration.  Treated with IV fluids.  DVT prophylaxis: Lovenox Code Status: Full code Family Communication: Discussed with sister at the bedside Disposition Plan: Discharge home once improved Consults called:   Admission status: Observation, MedSurg  Kathie Dike MD [facility]   If 7PM-7AM, please contact night-coverage [email or web address]   12/07/2018, 9:09 PM

## 2018-12-07 NOTE — H&P (Signed)
History and Physical    Debbie Santana GYI:[id number] DOB: May 03, 1991 DOA: 12/07/2018  PCP: [facility]  Patient coming from: Home  I have personally briefly reviewed patient's old medical records in [place]  Chief Complaint: Lower back pain, nausea and vomiting  HPI: Debbie Santana is a 28 y.o. female with medical history significant of previous back pain and back surgery, gastric ulcer, who recently had an laser ablation of the cervix by gynecology on 3/11, presents to the hospital with complaints of worsening back pain, nausea and vomiting.  She reports that her back pain has been present since her procedure.  She reports difficulty moving her legs due to pain.  She has not had any urinary or bowel incontinence.  She denies any numbness or tingling in her lower extremities.  She denies any trauma.  She reports that she has not had a bowel movement in over a week.  Yesterday, she began having loose stools and associated vomiting.  She describes a crampy pain in her lower abdomen.  Stools were loose in color, brown and nonbloody.  She describes vomit as yellow/green in color.  She had minimal p.o. intake.  She is unsure whether she is to have any fever, but does report chills.  She has not had any dysuria.  She was recently seen in the emergency room a few days ago and was diagnosed with bronchitis.  She also had back pain at that time.  This was felt to be a chronic issue.  She received steroids for bronchitis  ED Course: She is noted to have an elevated WBC count of 25,000.  She is afebrile.  She is clinically somewhat dehydrated.  The remainder of her labs are relatively unrevealing.  CT scan of the abdomen pelvis does not show any acute findings.  She had multiple episodes of retching and vomiting in the emergency room and was unable to keep anything down.  It was not felt that she would be able to manage at home in her current state.  Admission has been  requested.  Review of Systems:  GI: Positive for abdominal pain, nausea, vomiting, negative for hematemesis, hematochezia, melena GU.  Negative for dysuria Musculoskeletal.  Positive for lower back pain All other systems reviewed and found to be negative   Past Medical History:  Diagnosis Date   Anxiety    Bradycardia 04/17/2017   Chronic back pain    Erosive gastritis 04/11/2017   Gastric ulcer    Genital warts    History of kidney stones    Peptic ulcer disease 04/13/2017   Sciatica    Vaginal Pap smear, abnormal     Past Surgical History:  Procedure Laterality Date   BACK SURGERY     ruptured disc.   BIOPSY  04/14/2017   Procedure: BIOPSY;  Surgeon: Rogene Houston, MD;  [facility]: [facility];  Service: Endoscopy;;  gastric   CERVICAL ABLATION N/A 12/02/2018   Procedure: LASER ABLATION OF CERVIX;  Surgeon: Florian Buff, MD;  Location: [facility];  Service: Gynecology;  Laterality: N/A;   CHOLECYSTECTOMY N/A 04/17/2017   Procedure: LAPAROSCOPIC CHOLECYSTECTOMY;  Surgeon: Aviva Signs, MD;  Location: [facility];  Service: General;  Laterality: N/A;   COLPOSCOPY     ESOPHAGOGASTRODUODENOSCOPY (EGD) WITH PROPOFOL N/A 04/14/2017   Procedure: ESOPHAGOGASTRODUODENOSCOPY (EGD) WITH PROPOFOL;  Surgeon: Rogene Houston, MD;  [facility]: [facility];  Service: Endoscopy;  Laterality: N/A;   HERNIA REPAIR Bilateral  inguinal   LASER ABLATION CONDOLAMATA N/A 12/02/2018   Procedure: LASER ABLATION CONDYLOMA ACCUMINATA LEFT AND RIGHT VULVA, PERINEUM AND PERIANAL (15 TOTAL);  Surgeon: Florian Buff, MD;  Location: [facility];  Service: Gynecology;  Laterality: N/A;    Social History:  reports that she has been smoking cigarettes. She has a 2.50 pack-year smoking history. She has never used smokeless tobacco. She reports that she does not drink alcohol or use drugs.  No Known Allergies  Family History  Problem Relation Age of Onset   Anxiety disorder Mother     Hyperlipidemia Mother    Diabetes Maternal Grandfather    Diabetes Cousin    Learning disabilities Cousin     Prior to Admission medications   Medication Sig Start Date End Date Taking? Authorizing Provider  cyclobenzaprine (FLEXERIL) 10 MG tablet Take 1 tablet (10 mg total) by mouth 3 (three) times daily. 12/05/18  Yes Lily Kocher, PA-C  dexamethasone (DECADRON) 4 MG tablet Take 1 tablet (4 mg total) by mouth 2 (two) times daily with a meal. 12/05/18  Yes Lily Kocher, PA-C  dimenhyDRINATE (DRAMAMINE) 50 MG tablet Take 50 mg by mouth as needed.   Yes [provider]  diphenhydrAMINE (BENADRYL) 25 MG tablet Take 25 mg by mouth 2 (two) times daily as needed (headaches/allergies.).   Yes [provider]  HYDROcodone-acetaminophen (NORCO/VICODIN) 5-325 MG tablet Take 1 tablet by mouth every 6 (six) hours as needed. 12/02/18  Yes Florian Buff, MD  ketorolac (TORADOL) 10 MG tablet Take 1 tablet (10 mg total) by mouth every 8 (eight) hours as needed. 12/02/18  Yes Florian Buff, MD  silver sulfADIAZINE (SILVADENE) 1 % cream Apply 3 times a day 12/02/18  Yes Florian Buff, MD    Physical Exam: Vitals:   12/07/18 1030 12/07/18 1100 12/07/18 1423 12/07/18 1449  BP: 128/68 130/80 108/62 122/72  Pulse: (!) 47 (!) 53 (!) 59 (!) 54  Resp:   16 16  Temp:   98.2 F (36.8 C) (!) 96.1 F (35.6 C)  TempSrc:   Oral Oral  SpO2: 100% 100% 100% 98%  Weight:    106.9 kg  Height:    5\' 9"  (1.753 m)    Constitutional: NAD, calm, comfortable Eyes: PERRL, lids and conjunctivae normal ENMT: Mucous membranes are moist. Posterior pharynx clear of any exudate or lesions.Normal dentition.  Neck: normal, supple, no masses, no thyromegaly Respiratory: clear to auscultation bilaterally, no wheezing, no crackles. Normal respiratory effort. No accessory muscle use.  Cardiovascular: Regular rate and rhythm, no murmurs / rubs / gallops. No extremity edema. 2+ pedal pulses. No carotid bruits.   Abdomen: no tenderness, no masses palpated. No hepatosplenomegaly. Bowel sounds positive.  Musculoskeletal: no clubbing / cyanosis. No joint deformity upper and lower extremities. Good ROM, no contractures. Normal muscle tone.  Skin: no rashes, lesions, ulcers. No induration Neurologic: CN 2-12 grossly intact. Sensation intact, DTR normal. Strength 5/5 in all 4.  Straight leg test positive on bilateral lower extremities Psychiatric: Normal judgment and insight. Alert and oriented x 3. Normal mood.    Labs on Admission: I have personally reviewed following labs and imaging studies  CBC: Recent Labs  Lab 12/07/18 0607  WBC 25.2*  NEUTROABS 21.6*  HGB 15.7*  HCT 45.5  MCV 89.2  PLT 741   Basic Metabolic Panel: Recent Labs  Lab 12/07/18 0607  NA 138  K 3.7  CL 103  CO2 23  GLUCOSE 145*  BUN 14  CREATININE  1.05*  CALCIUM 9.7   GFR: Estimated Creatinine Clearance: 103.9 mL/min (A) (by C-G formula based on SCr of 1.05 mg/dL (H)). Liver Function Tests: Recent Labs  Lab 12/07/18 0607  AST 20  ALT 25  ALKPHOS 87  BILITOT 0.6  PROT 7.6  ALBUMIN 4.1   Recent Labs  Lab 12/07/18 0607  LIPASE 20   No results for input(s): AMMONIA in the last 168 hours. Coagulation Profile: No results for input(s): INR, PROTIME in the last 168 hours. Cardiac Enzymes: No results for input(s): CKTOTAL, CKMB, CKMBINDEX, TROPONINI in the last 168 hours. BNP (last 3 results) No results for input(s): PROBNP in the last 8760 hours. HbA1C: No results for input(s): HGBA1C in the last 72 hours. CBG: No results for input(s): GLUCAP in the last 168 hours. Lipid Profile: No results for input(s): CHOL, HDL, LDLCALC, TRIG, CHOLHDL, LDLDIRECT in the last 72 hours. Thyroid Function Tests: No results for input(s): TSH, T4TOTAL, FREET4, T3FREE, THYROIDAB in the last 72 hours. Anemia Panel: No results for input(s): VITAMINB12, FOLATE, FERRITIN, TIBC, IRON, RETICCTPCT in the last 72 hours. Urine  analysis:    Component Value Date/Time   COLORURINE YELLOW 12/07/2018 0607   APPEARANCEUR HAZY (A) 12/07/2018 0607   APPEARANCEUR Clear 11/04/2017 1159   LABSPEC 1.023 12/07/2018 0607   PHURINE 7.0 12/07/2018 0607   GLUCOSEU NEGATIVE 12/07/2018 0607   HGBUR LARGE (A) 12/07/2018 0607   BILIRUBINUR NEGATIVE 12/07/2018 0607   BILIRUBINUR Negative 11/04/2017 1159   Aneta 12/07/2018 0607   PROTEINUR NEGATIVE 12/07/2018 0607   UROBILINOGEN 0.2 05/14/2015 0028   NITRITE NEGATIVE 12/07/2018 0607   LEUKOCYTESUR LARGE (A) 12/07/2018 0607    Radiological Exams on Admission: Mr Lumbar Spine Wo Contrast  Result Date: 12/07/2018 CLINICAL DATA:  Low back pain radiating down both arms for 2 months EXAM: MRI LUMBAR SPINE WITHOUT CONTRAST TECHNIQUE: Multiplanar, multisequence MR imaging of the lumbar spine was performed. No intravenous contrast was administered. COMPARISON:  None. FINDINGS: Segmentation:  Standard. Alignment:  Physiologic. Vertebrae:  No fracture, evidence of discitis, or bone lesion. Conus medullaris and cauda equina: Conus extends to the 1 level. Conus and cauda equina appear normal. Paraspinal and other soft tissues: No acute paraspinal abnormality. Disc levels: Disc spaces: Degenerative disc disease with disc height loss at L4-5 and L5-S1. Disc desiccation at L2-3, L3-4. T12-L1: No significant disc bulge. No evidence of neural foraminal stenosis. No central canal stenosis. L1-L2: No significant disc bulge. No evidence of neural foraminal stenosis. No central canal stenosis. L2-L3: Broad-based disc bulge. No evidence of neural foraminal stenosis. No central canal stenosis. L3-L4: Broad central/right paracentral disc protrusion. Right lateral recess narrowing. No evidence of neural foraminal stenosis. No central canal stenosis. L4-L5: Broad central disc protrusion. Mild bilateral lateral recess narrowing. No evidence of neural foraminal stenosis. No central canal stenosis. L5-S1:  Broad-based disc bulge. Mild right facet arthropathy. No evidence of neural foraminal stenosis. No central canal stenosis. IMPRESSION: 1. At L3-4 there is a broad central/right paracentral disc protrusion. Right lateral recess narrowing. 2. At L4-5 there is a broad central disc protrusion. Mild bilateral lateral recess narrowing. Electronically Signed   By: Kathreen Devoid   On: 12/07/2018 18:05   Ct Abdomen Pelvis W Contrast  Result Date: 12/07/2018 CLINICAL DATA:  Abdominal pain for several hours EXAM: CT ABDOMEN AND PELVIS WITH CONTRAST TECHNIQUE: Multidetector CT imaging of the abdomen and pelvis was performed using the standard protocol following bolus administration of intravenous contrast. CONTRAST:  15mL OMNIPAQUE IOHEXOL  300 MG/ML  SOLN COMPARISON:  Plain film from earlier in the same day. FINDINGS: Lower chest: No acute abnormality. Hepatobiliary: No focal liver abnormality is seen. Status post cholecystectomy. No biliary dilatation. Pancreas: Unremarkable. No pancreatic ductal dilatation or surrounding inflammatory changes. Spleen: Normal in size without focal abnormality. Adrenals/Urinary Tract: Adrenal glands are within normal limits bilaterally. Kidneys are well visualized bilaterally without renal calculi. No obstructive changes are seen. Bladder is partially distended. Stomach/Bowel: The appendix is within normal limits. The colon is decompressed. No significant retained fecal material is noted. No obstructive or inflammatory changes are seen. Vascular/Lymphatic: No significant vascular findings are present. No enlarged abdominal or pelvic lymph nodes. Reproductive: Uterus and bilateral adnexa are unremarkable. Other: No abdominal wall hernia or abnormality. No abdominopelvic ascites. Musculoskeletal: No acute or significant osseous findings. IMPRESSION: No acute abnormality noted. Electronically Signed   By: Inez Catalina M.D.   On: 12/07/2018 11:11   Dg Abd 2 Views  Result Date:  12/07/2018 CLINICAL DATA:  Abdominal pain and vomiting for 5 hours EXAM: ABDOMEN - 2 VIEW COMPARISON:  04/26/2017 FINDINGS: Unremarkable bowel gas pattern. No concerning mass effect or calcification. Cholecystectomy clips. Lung bases are clear. No osseous findings. IMPRESSION: Negative. Electronically Signed   By: Monte Fantasia M.D.   On: 12/07/2018 07:27    Assessment/Plan Active Problems:   Leukocytosis   Tobacco abuse   Nausea & vomiting   Low back pain   Gastroenteritis   Acute lower UTI   Dehydration     1. Nausea and vomiting.  Unclear etiology.  Possibly gastroenteritis.  CT of the abdomen pelvis is unrevealing for any bowel pathology.  Treat supportively with antiemetics, PPI, antispasmodics, IV fluids.  We will keep the patient n.p.o. for now and advance diet as tolerated. 2. Leukocytosis.  Suspect this is related to steroids as well as stress response from vomiting.  She does not appear to be septic.  We will continue to monitor. 3. Low back pain.  Patient reports that this is limiting and has significant pain.  She has had prior surgery before, approximately 10 years ago.  Will request MRI of the lumbar spine to further evaluate her lumbar anatomy. 4. Possible urinary tract infection.  Urinalysis indicates pyuria, but this may be dehydration.  She is been started on antibiotics.  Follow-up culture. 5. Dehydration.  Treated with IV fluids.  DVT prophylaxis: Lovenox Code Status: Full code Family Communication: Discussed with sister at the bedside Disposition Plan: Discharge home once improved Consults called:   Admission status: Observation, MedSurg  Kathie Dike MD [facility]   If 7PM-7AM, please contact night-coverage [email or web address]   12/07/2018, 9:47 PM

## 2018-12-08 DIAGNOSIS — K529 Noninfective gastroenteritis and colitis, unspecified: Secondary | ICD-10-CM | POA: Diagnosis not present

## 2018-12-08 DIAGNOSIS — E860 Dehydration: Secondary | ICD-10-CM | POA: Diagnosis not present

## 2018-12-08 DIAGNOSIS — R1084 Generalized abdominal pain: Secondary | ICD-10-CM | POA: Diagnosis not present

## 2018-12-08 DIAGNOSIS — R112 Nausea with vomiting, unspecified: Secondary | ICD-10-CM | POA: Diagnosis not present

## 2018-12-08 LAB — COMPREHENSIVE METABOLIC PANEL
ALBUMIN: 3.5 g/dL (ref 3.5–5.0)
ALT: 21 U/L (ref 0–44)
AST: 15 U/L (ref 15–41)
Alkaline Phosphatase: 66 U/L (ref 38–126)
Anion gap: 6 (ref 5–15)
BUN: 13 mg/dL (ref 6–20)
CHLORIDE: 108 mmol/L (ref 98–111)
CO2: 26 mmol/L (ref 22–32)
Calcium: 8.9 mg/dL (ref 8.9–10.3)
Creatinine, Ser: 0.94 mg/dL (ref 0.44–1.00)
GFR calc Af Amer: >60 mL/min (ref >60)
GFR calc non Af Amer: >60 mL/min (ref >60)
Glucose, Bld: 84 mg/dL (ref 70–99)
Potassium: 3.7 mmol/L (ref 3.5–5.1)
Sodium: 140 mmol/L (ref 135–145)
Total Bilirubin: 0.5 mg/dL (ref 0.3–1.2)
Total Protein: 6.2 g/dL — ABNORMAL LOW (ref 6.5–8.1)

## 2018-12-08 LAB — CBC
HCT: 40.5 % (ref 36.0–46.0)
Hemoglobin: 12.9 g/dL (ref 12.0–15.0)
MCH: 30.3 pg (ref 26.0–34.0)
MCHC: 31.9 g/dL (ref 30.0–36.0)
MCV: 95.1 fL (ref 80.0–100.0)
Platelets: 222 10*3/uL (ref 150–400)
RBC: 4.26 MIL/uL (ref 3.87–5.11)
RDW: 12.8 % (ref 11.5–15.5)
WBC: 11.9 10*3/uL — ABNORMAL HIGH (ref 4.0–10.5)
nRBC: 0 % (ref 0.0–0.2)

## 2018-12-08 MED ORDER — PANTOPRAZOLE SODIUM 40 MG PO TBEC: 40.0000 mg | DELAYED_RELEASE_TABLET | Freq: Every day | ORAL | 1 refills | Status: DC

## 2018-12-08 MED ORDER — PREDNISONE 20 MG PO TABS: 40.0000 mg | ORAL_TABLET | Freq: Every day | ORAL | 0 refills | Status: DC

## 2018-12-08 MED ORDER — DICYCLOMINE HCL 10 MG PO CAPS: 20.0000 mg | ORAL_CAPSULE | Freq: Three times a day (TID) | ORAL | 0 refills | Status: DC

## 2018-12-08 MED ORDER — ONDANSETRON 4 MG PO TBDP: 4.0000 mg | ORAL_TABLET | Freq: Three times a day (TID) | ORAL | 0 refills | Status: DC | PRN

## 2018-12-08 MED ORDER — CEPHALEXIN 500 MG PO CAPS: 500.0000 mg | ORAL_CAPSULE | Freq: Three times a day (TID) | ORAL | 0 refills | Status: AC

## 2018-12-08 MED ORDER — PREDNISONE 20 MG PO TABS
40.0000 mg | ORAL_TABLET | Freq: Every day | ORAL | Status: DC
  Administered 2018-12-08: 40 mg via ORAL
  Filled 2018-12-08: qty 2

## 2018-12-08 NOTE — Discharge Summary (Signed)
Physician Discharge Summary  Debbie Santana NIO:[id number] DOB: 03-13-1991 DOA: 12/07/2018  PCP: Sandria Manly [place] date: 05/10/2992 Discharge date: 12/08/2018  Admitted From: Home Disposition: Home  Recommendations for Outpatient Follow-up:  1. Follow up with PCP in 1-2 weeks 2. Please obtain BMP/CBC in one week  Discharge Condition: Stable CODE STATUS: Full code Diet recommendation: Regular diet  Brief/Interim Summary: 28 year old female with a history of previous back pain and back surgery, gastritis, who recently underwent laser ablation of the cervix by gynecology on 3/11, presents to the hospital with worsening back pain, nausea and vomiting.  She underwent CT of the abdomen pelvis that did not show any acute process in her abdomen.  MRI of the lumbar spine was also performed that showed some disc bulging, but no acute processes.  She did not have any significant neurologic deficits, although range of motion was limited somewhat by pain.  Patient was started on a prednisone taper with improvement of her lower back pain.  She was treated supportively for nausea and vomiting with antiemetics, PPIs, IV fluids.  She also received dicyclomine for abdominal cramps.  Her abdominal symptoms have significantly improved.  Vomiting has resolved and she is tolerating solid diet.  Urinalysis indicated possible infection.  She is being treated empirically with Keflex.  Patient feels ready for discharge home today.  Discharge Diagnoses:  Active Problems:   Leukocytosis   Tobacco abuse   Nausea & vomiting   Low back pain   Gastroenteritis   Acute lower UTI   Dehydration    Discharge Instructions  Discharge Instructions    Diet - low sodium heart healthy   Complete by:  As directed    Increase activity slowly   Complete by:  As directed      Allergies as of 12/08/2018   No Known Allergies     Medication List    STOP taking these medications   dexamethasone 4 MG  tablet Commonly known as:  DECADRON     TAKE these medications   cephALEXin 500 MG capsule Commonly known as:  KEFLEX Take 1 capsule (500 mg total) by mouth 3 (three) times daily for 3 days.   cyclobenzaprine 10 MG tablet Commonly known as:  FLEXERIL Take 1 tablet (10 mg total) by mouth 3 (three) times daily.   dicyclomine 10 MG capsule Commonly known as:  BENTYL Take 2 capsules (20 mg total) by mouth 3 (three) times daily before meals. Start taking on:  December 09, 2018   dimenhyDRINATE 50 MG tablet Commonly known as:  DRAMAMINE Take 50 mg by mouth as needed.   diphenhydrAMINE 25 MG tablet Commonly known as:  BENADRYL Take 25 mg by mouth 2 (two) times daily as needed (headaches/allergies.).   HYDROcodone-acetaminophen 5-325 MG tablet Commonly known as:  NORCO/VICODIN Take 1 tablet by mouth every 6 (six) hours as needed.   ketorolac 10 MG tablet Commonly known as:  TORADOL Take 1 tablet (10 mg total) by mouth every 8 (eight) hours as needed.   ondansetron 4 MG disintegrating tablet Commonly known as:  Zofran ODT Take 1 tablet (4 mg total) by mouth every 8 (eight) hours as needed for nausea or vomiting.   pantoprazole 40 MG tablet Commonly known as:  Protonix Take 1 tablet (40 mg total) by mouth daily.   predniSONE 20 MG tablet Commonly known as:  DELTASONE Take 2 tablets (40 mg total) by mouth daily with breakfast. Start taking on:  December 09, 2018  silver sulfADIAZINE 1 % cream Commonly known as:  Silvadene Apply 3 times a day       No Known Allergies  Consultations:     Procedures/Studies: Dg Chest 2 View  Result Date: 12/05/2018 CLINICAL DATA:  Cough and back pain. EXAM: CHEST - 2 VIEW COMPARISON:  06/02/2014 and prior radiographs FINDINGS: The cardiomediastinal silhouette is unremarkable. Mild peribronchial thickening again noted. There is no evidence of focal airspace disease, pulmonary edema, suspicious pulmonary nodule/mass, pleural effusion, or  pneumothorax. No acute bony abnormalities are identified. IMPRESSION: No active cardiopulmonary disease. Electronically Signed   By: Margarette Canada M.D.   On: 12/05/2018 13:26   Mr Lumbar Spine Wo Contrast  Result Date: 12/07/2018 CLINICAL DATA:  Low back pain radiating down both arms for 2 months EXAM: MRI LUMBAR SPINE WITHOUT CONTRAST TECHNIQUE: Multiplanar, multisequence MR imaging of the lumbar spine was performed. No intravenous contrast was administered. COMPARISON:  None. FINDINGS: Segmentation:  Standard. Alignment:  Physiologic. Vertebrae:  No fracture, evidence of discitis, or bone lesion. Conus medullaris and cauda equina: Conus extends to the 1 level. Conus and cauda equina appear normal. Paraspinal and other soft tissues: No acute paraspinal abnormality. Disc levels: Disc spaces: Degenerative disc disease with disc height loss at L4-5 and L5-S1. Disc desiccation at L2-3, L3-4. T12-L1: No significant disc bulge. No evidence of neural foraminal stenosis. No central canal stenosis. L1-L2: No significant disc bulge. No evidence of neural foraminal stenosis. No central canal stenosis. L2-L3: Broad-based disc bulge. No evidence of neural foraminal stenosis. No central canal stenosis. L3-L4: Broad central/right paracentral disc protrusion. Right lateral recess narrowing. No evidence of neural foraminal stenosis. No central canal stenosis. L4-L5: Broad central disc protrusion. Mild bilateral lateral recess narrowing. No evidence of neural foraminal stenosis. No central canal stenosis. L5-S1: Broad-based disc bulge. Mild right facet arthropathy. No evidence of neural foraminal stenosis. No central canal stenosis. IMPRESSION: 1. At L3-4 there is a broad central/right paracentral disc protrusion. Right lateral recess narrowing. 2. At L4-5 there is a broad central disc protrusion. Mild bilateral lateral recess narrowing. Electronically Signed   By: Kathreen Devoid   On: 12/07/2018 18:05   Ct Abdomen Pelvis W  Contrast  Result Date: 12/07/2018 CLINICAL DATA:  Abdominal pain for several hours EXAM: CT ABDOMEN AND PELVIS WITH CONTRAST TECHNIQUE: Multidetector CT imaging of the abdomen and pelvis was performed using the standard protocol following bolus administration of intravenous contrast. CONTRAST:  168mL OMNIPAQUE IOHEXOL 300 MG/ML  SOLN COMPARISON:  Plain film from earlier in the same day. FINDINGS: Lower chest: No acute abnormality. Hepatobiliary: No focal liver abnormality is seen. Status post cholecystectomy. No biliary dilatation. Pancreas: Unremarkable. No pancreatic ductal dilatation or surrounding inflammatory changes. Spleen: Normal in size without focal abnormality. Adrenals/Urinary Tract: Adrenal glands are within normal limits bilaterally. Kidneys are well visualized bilaterally without renal calculi. No obstructive changes are seen. Bladder is partially distended. Stomach/Bowel: The appendix is within normal limits. The colon is decompressed. No significant retained fecal material is noted. No obstructive or inflammatory changes are seen. Vascular/Lymphatic: No significant vascular findings are present. No enlarged abdominal or pelvic lymph nodes. Reproductive: Uterus and bilateral adnexa are unremarkable. Other: No abdominal wall hernia or abnormality. No abdominopelvic ascites. Musculoskeletal: No acute or significant osseous findings. IMPRESSION: No acute abnormality noted. Electronically Signed   By: Inez Catalina M.D.   On: 12/07/2018 11:11   Dg Abd 2 Views  Result Date: 12/07/2018 CLINICAL DATA:  Abdominal pain and vomiting for 5 hours  EXAM: ABDOMEN - 2 VIEW COMPARISON:  04/26/2017 FINDINGS: Unremarkable bowel gas pattern. No concerning mass effect or calcification. Cholecystectomy clips. Lung bases are clear. No osseous findings. IMPRESSION: Negative. Electronically Signed   By: Monte Fantasia M.D.   On: 12/07/2018 07:27       Subjective: Patient is feeling better.  Nausea and vomiting  has resolved.  Tolerating solid diet.  Back pain is better than it was yesterday.  Discharge Exam: Vitals:   12/07/18 1449 12/07/18 2207 12/08/18 0639 12/08/18 1427  BP: 122/72 117/79 109/70 123/77  Pulse: (!) 54 (!) 51 (!) 52 (!) 52  Resp: 16 16 16 18   Temp: (!) 96.1 F (35.6 C) 98.7 F (37.1 C) 99 F (37.2 C) 98.3 F (36.8 C)  TempSrc: Oral Oral Oral   SpO2: 98% 98% 100% 100%  Weight: 106.9 kg     Height: 5\' 9"  (1.753 m)       General: Pt is alert, awake, not in acute distress Cardiovascular: RRR, S1/S2 +, no rubs, no gallops Respiratory: CTA bilaterally, no wheezing, no rhonchi Abdominal: Soft, NT, ND, bowel sounds + Extremities: no edema, no cyanosis    The results of significant diagnostics from this hospitalization (including imaging, microbiology, ancillary and laboratory) are listed below for reference.     Microbiology: Recent Results (from the past 240 hour(s))  Urine culture     Status: Abnormal (Preliminary result)   Collection Time: 12/07/18  6:07 AM  Result Value Ref Range Status   Specimen Description   Final    URINE, CLEAN CATCH Performed at [facility], [address], [place], [place] [address]    Special Requests   Final    NONE Performed at [facility], [address], [place], [place] [address]    Culture (A)  Final    40,000 COLONIES/mL GRAM NEGATIVE RODS CULTURE REINCUBATED FOR BETTER GROWTH Performed at [facility], [place] [address], [place], [place] [address]    Report Status PENDING  Incomplete     Labs: BNP (last 3 results) No results for input(s): BNP in the last 8760 hours. Basic Metabolic Panel: Recent Labs  Lab 12/07/18 0607 12/08/18 0436  NA 138 140  K 3.7 3.7  CL 103 108  CO2 23 26  GLUCOSE 145* 84  BUN 14 13  CREATININE 1.05* 0.94  CALCIUM 9.7 8.9   Liver Function Tests: Recent Labs  Lab 12/07/18 0607 12/08/18 0436  AST 20 15  ALT 25 21  ALKPHOS 87 66  BILITOT 0.6 0.5  PROT 7.6 6.2*  ALBUMIN 4.1 3.5    Recent Labs  Lab 12/07/18 0607  LIPASE 20   No results for input(s): AMMONIA in the last 168 hours. CBC: Recent Labs  Lab 12/07/18 0607 12/08/18 0436  WBC 25.2* 11.9*  NEUTROABS 21.6*  --   HGB 15.7* 12.9  HCT 45.5 40.5  MCV 89.2 95.1  PLT 309 222   Cardiac Enzymes: No results for input(s): CKTOTAL, CKMB, CKMBINDEX, TROPONINI in the last 168 hours. BNP: Invalid input(s): POCBNP CBG: No results for input(s): GLUCAP in the last 168 hours. D-Dimer No results for input(s): DDIMER in the last 72 hours. Hgb A1c No results for input(s): HGBA1C in the last 72 hours. Lipid Profile No results for input(s): CHOL, HDL, LDLCALC, TRIG, CHOLHDL, LDLDIRECT in the last 72 hours. Thyroid function studies No results for input(s): TSH, T4TOTAL, T3FREE, THYROIDAB in the last 72 hours.  Invalid input(s): FREET3 Anemia work up No results for  input(s): VITAMINB12, FOLATE, FERRITIN, TIBC, IRON, RETICCTPCT in the last 72 hours. Urinalysis    Component Value Date/Time   COLORURINE YELLOW 12/07/2018 0607   APPEARANCEUR HAZY (A) 12/07/2018 0607   APPEARANCEUR Clear 11/04/2017 1159   LABSPEC 1.023 12/07/2018 0607   PHURINE 7.0 12/07/2018 0607   GLUCOSEU NEGATIVE 12/07/2018 0607   HGBUR LARGE (A) 12/07/2018 0607   BILIRUBINUR NEGATIVE 12/07/2018 0607   BILIRUBINUR Negative 11/04/2017 1159   KETONESUR NEGATIVE 12/07/2018 0607   PROTEINUR NEGATIVE 12/07/2018 0607   UROBILINOGEN 0.2 05/14/2015 0028   NITRITE NEGATIVE 12/07/2018 0607   LEUKOCYTESUR LARGE (A) 12/07/2018 0607   Sepsis Labs Invalid input(s): PROCALCITONIN,  WBC,  LACTICIDVEN Microbiology Recent Results (from the past 240 hour(s))  Urine culture     Status: Abnormal (Preliminary result)   Collection Time: 12/07/18  6:07 AM  Result Value Ref Range Status   Specimen Description   Final    URINE, CLEAN CATCH Performed at [facility], [address] [place] [address] [place], [place] [address]    Special Requests   Final    NONE Performed  at [facility], [address] [place], [place] [address]    Culture (A)  Final    40,000 COLONIES/mL GRAM NEGATIVE RODS CULTURE REINCUBATED FOR BETTER GROWTH Performed at [facility], [place] [address] [place], [place] [address]    Report Status PENDING  Incomplete     Time coordinating discharge: 67mins  SIGNED:   Kathie Dike, MD  [facility] 12/08/2018, 9:39 PM   If 7PM-7AM, please contact night-coverage [email or web address]

## 2018-12-08 NOTE — Discharge Instructions (Signed)
Abdominal Pain, Adult  Abdominal pain can be caused by many things. Often, abdominal pain is not serious and it gets better with no treatment or by being treated at home. However, sometimes abdominal pain is serious. Your health care provider will do a medical history and a physical exam to try to determine the cause of your abdominal pain.  Follow these instructions at home:   Take over-the-counter and prescription medicines only as told by your health care provider. Do not take a laxative unless told by your health care provider.   Drink enough fluid to keep your urine clear or pale yellow.   Watch your condition for any changes.   Keep all follow-up visits as told by your health care provider. This is important.  Contact a health care provider if:   Your abdominal pain changes or gets worse.   You are not hungry or you lose weight without trying.   You are constipated or have diarrhea for more than 2-3 days.   You have pain when you urinate or have a bowel movement.   Your abdominal pain wakes you up at night.   Your pain gets worse with meals, after eating, or with certain foods.   You are throwing up and cannot keep anything down.   You have a fever.  Get help right away if:   Your pain does not go away as soon as your health care provider told you to expect.   You cannot stop throwing up.   Your pain is only in areas of the abdomen, such as the right side or the left lower portion of the abdomen.   You have bloody or black stools, or stools that look like tar.   You have severe pain, cramping, or bloating in your abdomen.   You have signs of dehydration, such as:  ? Dark urine, very little urine, or no urine.  ? Cracked lips.  ? Dry mouth.  ? Sunken eyes.  ? Sleepiness.  ? Weakness.  This information is not intended to replace advice given to you by your health care provider. Make sure you discuss any questions you have with your health care provider.  Document Released: 06/19/2005 Document  Revised: 03/29/2016 Document Reviewed: 02/21/2016  Elsevier Interactive Patient Education  2019 [employer]

## 2018-12-08 NOTE — Plan of Care (Signed)
  Problem: Education: Goal: Knowledge of General Education information will improve Description Including pain rating scale, medication(s)/side effects and non-pharmacologic comfort measures 12/08/2018 1832 by Rance Muir, RN Outcome: Adequate for Discharge 12/08/2018 0809 by Rance Muir, RN Outcome: Progressing   Problem: Health Behavior/Discharge Planning: Goal: Ability to manage health-related needs will improve 12/08/2018 1832 by Rance Muir, RN Outcome: Adequate for Discharge 12/08/2018 0809 by Rance Muir, RN Outcome: Progressing   Problem: Clinical Measurements: Goal: Ability to maintain clinical measurements within normal limits will improve 12/08/2018 1832 by Rance Muir, RN Outcome: Adequate for Discharge 12/08/2018 0809 by Rance Muir, RN Outcome: Progressing Goal: Will remain free from infection 12/08/2018 1832 by Rance Muir, RN Outcome: Adequate for Discharge 12/08/2018 0809 by Rance Muir, RN Outcome: Progressing Goal: Diagnostic test results will improve 12/08/2018 1832 by Rance Muir, RN Outcome: Adequate for Discharge 12/08/2018 0809 by Rance Muir, RN Outcome: Progressing Goal: Respiratory complications will improve 12/08/2018 1832 by Rance Muir, RN Outcome: Adequate for Discharge 12/08/2018 0809 by Rance Muir, RN Outcome: Progressing Goal: Cardiovascular complication will be avoided 12/08/2018 1832 by Rance Muir, RN Outcome: Adequate for Discharge 12/08/2018 0809 by Rance Muir, RN Outcome: Progressing   Problem: Activity: Goal: Risk for activity intolerance will decrease 12/08/2018 1832 by Rance Muir, RN Outcome: Adequate for Discharge 12/08/2018 0809 by Rance Muir, RN Outcome: Progressing   Problem: Nutrition: Goal: Adequate nutrition will be maintained 12/08/2018 1832 by Rance Muir, RN Outcome: Adequate for Discharge 12/08/2018 0809 by Rance Muir, RN Outcome: Progressing   Problem: Coping: Goal: Level of anxiety will decrease 12/08/2018 1832 by Rance Muir, RN Outcome: Adequate for  Discharge 12/08/2018 0809 by Rance Muir, RN Outcome: Progressing   Problem: Elimination: Goal: Will not experience complications related to bowel motility 12/08/2018 1832 by Rance Muir, RN Outcome: Adequate for Discharge 12/08/2018 0809 by Rance Muir, RN Outcome: Progressing Goal: Will not experience complications related to urinary retention 12/08/2018 1832 by Rance Muir, RN Outcome: Adequate for Discharge 12/08/2018 0809 by Rance Muir, RN Outcome: Progressing   Problem: Pain Managment: Goal: General experience of comfort will improve 12/08/2018 1832 by Rance Muir, RN Outcome: Adequate for Discharge 12/08/2018 0809 by Rance Muir, RN Outcome: Progressing   Problem: Safety: Goal: Ability to remain free from injury will improve 12/08/2018 1832 by Rance Muir, RN Outcome: Adequate for Discharge 12/08/2018 0809 by Rance Muir, RN Outcome: Progressing   Problem: Skin Integrity: Goal: Risk for impaired skin integrity will decrease 12/08/2018 1832 by Rance Muir, RN Outcome: Adequate for Discharge 12/08/2018 0809 by Rance Muir, RN Outcome: Progressing

## 2018-12-08 NOTE — Plan of Care (Signed)

## 2018-12-08 NOTE — Plan of Care (Signed)
  Problem: Education: Goal: Knowledge of General Education information will improve Description Including pain rating scale, medication(s)/side effects and non-pharmacologic comfort measures 12/08/2018 1832 by Rance Muir, RN Outcome: Adequate for Discharge 12/08/2018 1832 by Rance Muir, RN Outcome: Adequate for Discharge 12/08/2018 0809 by Rance Muir, RN Outcome: Progressing   Problem: Health Behavior/Discharge Planning: Goal: Ability to manage health-related needs will improve 12/08/2018 1832 by Rance Muir, RN Outcome: Adequate for Discharge 12/08/2018 1832 by Rance Muir, RN Outcome: Adequate for Discharge 12/08/2018 0809 by Rance Muir, RN Outcome: Progressing   Problem: Clinical Measurements: Goal: Ability to maintain clinical measurements within normal limits will improve 12/08/2018 1832 by Rance Muir, RN Outcome: Adequate for Discharge 12/08/2018 1832 by Rance Muir, RN Outcome: Adequate for Discharge 12/08/2018 0809 by Rance Muir, RN Outcome: Progressing Goal: Will remain free from infection 12/08/2018 1832 by Rance Muir, RN Outcome: Adequate for Discharge 12/08/2018 1832 by Rance Muir, RN Outcome: Adequate for Discharge 12/08/2018 0809 by Rance Muir, RN Outcome: Progressing Goal: Diagnostic test results will improve 12/08/2018 1832 by Rance Muir, RN Outcome: Adequate for Discharge 12/08/2018 1832 by Rance Muir, RN Outcome: Adequate for Discharge 12/08/2018 0809 by Rance Muir, RN Outcome: Progressing Goal: Respiratory complications will improve 12/08/2018 1832 by Rance Muir, RN Outcome: Adequate for Discharge 12/08/2018 1832 by Rance Muir, RN Outcome: Adequate for Discharge 12/08/2018 0809 by Rance Muir, RN Outcome: Progressing Goal: Cardiovascular complication will be avoided 12/08/2018 1832 by Rance Muir, RN Outcome: Adequate for Discharge 12/08/2018 1832 by Rance Muir, RN Outcome: Adequate for Discharge 12/08/2018 0809 by Rance Muir, RN Outcome: Progressing   Problem: Activity: Goal: Risk for  activity intolerance will decrease 12/08/2018 1832 by Rance Muir, RN Outcome: Adequate for Discharge 12/08/2018 1832 by Rance Muir, RN Outcome: Adequate for Discharge 12/08/2018 0809 by Rance Muir, RN Outcome: Progressing   Problem: Nutrition: Goal: Adequate nutrition will be maintained 12/08/2018 1832 by Rance Muir, RN Outcome: Adequate for Discharge 12/08/2018 1832 by Rance Muir, RN Outcome: Adequate for Discharge 12/08/2018 0809 by Rance Muir, RN Outcome: Progressing   Problem: Coping: Goal: Level of anxiety will decrease 12/08/2018 1832 by Rance Muir, RN Outcome: Adequate for Discharge 12/08/2018 1832 by Rance Muir, RN Outcome: Adequate for Discharge 12/08/2018 0809 by Rance Muir, RN Outcome: Progressing   Problem: Elimination: Goal: Will not experience complications related to bowel motility 12/08/2018 1832 by Rance Muir, RN Outcome: Adequate for Discharge 12/08/2018 1832 by Rance Muir, RN Outcome: Adequate for Discharge 12/08/2018 0809 by Rance Muir, RN Outcome: Progressing Goal: Will not experience complications related to urinary retention 12/08/2018 1832 by Rance Muir, RN Outcome: Adequate for Discharge 12/08/2018 1832 by Rance Muir, RN Outcome: Adequate for Discharge 12/08/2018 0809 by Rance Muir, RN Outcome: Progressing   Problem: Pain Managment: Goal: General experience of comfort will improve 12/08/2018 1832 by Rance Muir, RN Outcome: Adequate for Discharge 12/08/2018 1832 by Rance Muir, RN Outcome: Adequate for Discharge 12/08/2018 0809 by Rance Muir, RN Outcome: Progressing   Problem: Safety: Goal: Ability to remain free from injury will improve 12/08/2018 1832 by Rance Muir, RN Outcome: Adequate for Discharge 12/08/2018 1832 by Rance Muir, RN Outcome: Adequate for Discharge 12/08/2018 0809 by Rance Muir, RN Outcome: Progressing   Problem: Skin Integrity: Goal: Risk for impaired skin integrity will decrease 12/08/2018 1832 by Rance Muir, RN Outcome: Adequate for Discharge 12/08/2018  1832 by Rance Muir, RN Outcome: Adequate for Discharge 12/08/2018 0809 by Rance Muir, RN Outcome: Progressing

## 2018-12-09 ENCOUNTER — Telehealth: Payer: Self-pay | Admitting: *Deleted

## 2018-12-09 LAB — URINE CULTURE: Culture: 40000 — AB

## 2018-12-11 ENCOUNTER — Other Ambulatory Visit: Payer: Self-pay

## 2018-12-11 ENCOUNTER — Ambulatory Visit: Payer: Medicaid Other | Admitting: Obstetrics & Gynecology

## 2018-12-11 ENCOUNTER — Encounter: Payer: Self-pay | Admitting: Obstetrics & Gynecology

## 2018-12-11 VITALS — BP 142/80 | HR 79 | Ht 68.0 in | Wt 235.8 lb

## 2018-12-11 DIAGNOSIS — Z9889 Other specified postprocedural states: Secondary | ICD-10-CM

## 2018-12-11 MED ORDER — HYDROCODONE-ACETAMINOPHEN 5-325 MG PO TABS: 1.0000 | ORAL_TABLET | Freq: Four times a day (QID) | ORAL | 0 refills | Status: DC | PRN

## 2018-12-11 NOTE — Progress Notes (Signed)
  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  The patient's immediate postoperative recovery has been unremarkable. Since hospital discharge the patient reports pain, resolved gastritis.   Current Outpatient Medications: cephALEXin (KEFLEX) 500 MG capsule, Take 1 capsule (500 mg total) by mouth 3 (three) times daily for 3 days., Disp: 9 capsule, Rfl: 0 cyclobenzaprine (FLEXERIL) 10 MG tablet, Take 1 tablet (10 mg total) by mouth 3 (three) times daily., Disp: 20 tablet, Rfl: 0 dicyclomine (BENTYL) 10 MG capsule, Take 2 capsules (20 mg total) by mouth 3 (three) times daily before meals., Disp: 30 capsule, Rfl: 0 HYDROcodone-acetaminophen (NORCO/VICODIN) 5-325 MG tablet, Take 1 tablet by mouth every 6 (six) hours as needed., Disp: 15 tablet, Rfl: 0 ketorolac (TORADOL) 10 MG tablet, Take 1 tablet (10 mg total) by mouth every 8 (eight) hours as needed., Disp: 15 tablet, Rfl: 0 ondansetron (ZOFRAN ODT) 4 MG disintegrating tablet, Take 1 tablet (4 mg total) by mouth every 8 (eight) hours as needed for nausea or vomiting., Disp: 20 tablet, Rfl: 0 pantoprazole (PROTONIX) 40 MG tablet, Take 1 tablet (40 mg total) by mouth daily., Disp: 30 tablet, Rfl: 1 predniSONE (DELTASONE) 20 MG tablet, Take 2 tablets (40 mg total) by mouth daily with breakfast., Disp: 10 tablet, Rfl: 0 silver sulfADIAZINE (SILVADENE) 1 % cream, Apply 3 times a day, Disp: 50 g, Rfl: 11 dimenhyDRINATE (DRAMAMINE) 50 MG tablet, Take 50 mg by mouth as needed., Disp: , Rfl:  diphenhydrAMINE (BENADRYL) 25 MG tablet, Take 25 mg by mouth 2 (two) times daily as needed (headaches/allergies.)., Disp: , Rfl:   No current facility-administered medications for this visit.     Blood pressure (!) 142/80, pulse 79, height 5\' 8"  (1.727 m), weight 235 lb 12.8 oz (107 kg), last menstrual period 11/19/2018, not currently  breastfeeding.  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

## 2018-12-11 NOTE — Addendum Note (Signed)
Addended by: Florian Buff on: 12/11/2018 09:58 AM   Modules accepted: Orders

## 2018-12-16 IMAGING — CT CT ABD-PELV W/ CM
2 of 4 series · 16 of 46 positions shown, 18 images · IV contrast (iopamidol)
Comparison: Ultrasound from earlier in the same day.

CLINICAL DATA: Nausea and vomiting for several months

EXAM:
CT ABDOMEN AND PELVIS WITH CONTRAST
TECHNIQUE: Multidetector CT imaging of the abdomen and pelvis was performed
using the standard protocol following bolus administration of
intravenous contrast.
CONTRAST:  100mL [id number] IOPAMIDOL ([id number]) INJECTION 61%

[Series 3: a/p w/ 5mm · axial · 0.82mm/px · z∈[+996,+1420]mm · 13 of 93 slices shown, 15 images]
[im 4/93  soft-tissue]
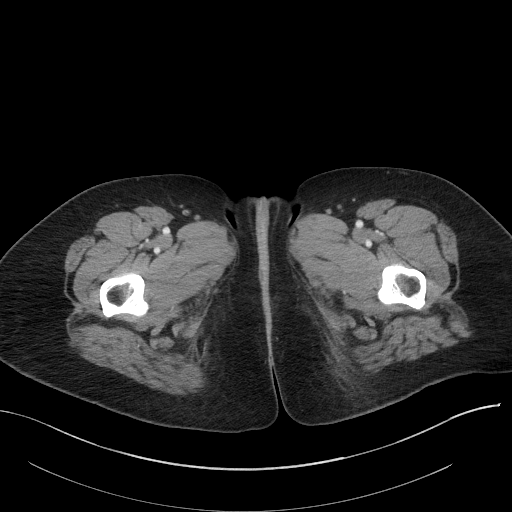
[im 4/93  bone]
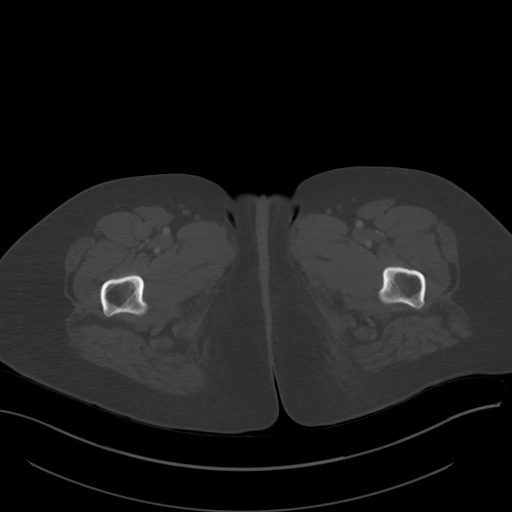
[im 12/93  soft-tissue]
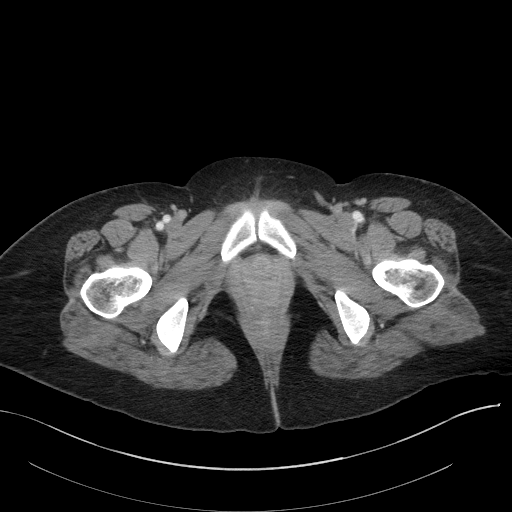
[im 19/93  soft-tissue]
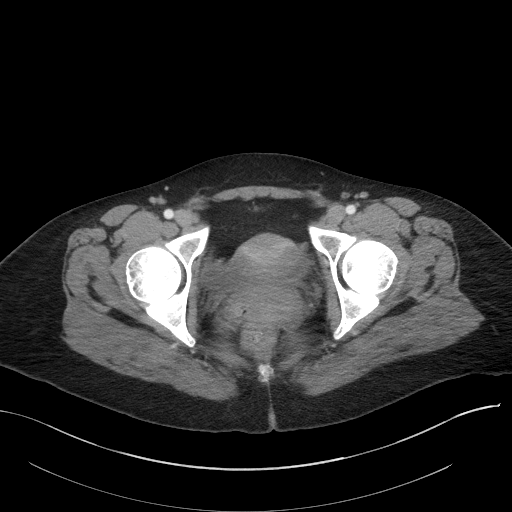
[im 26/93  soft-tissue]
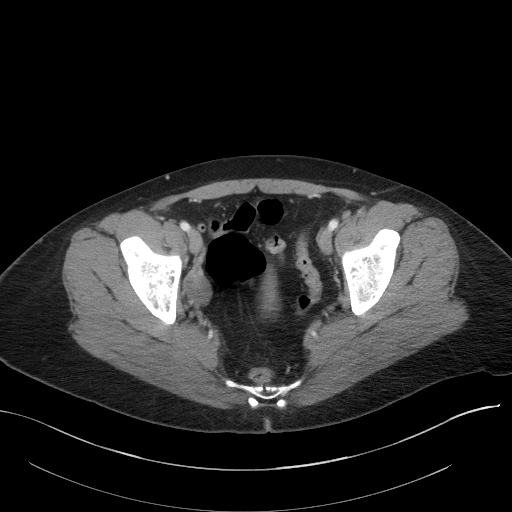
[im 34/93  soft-tissue]
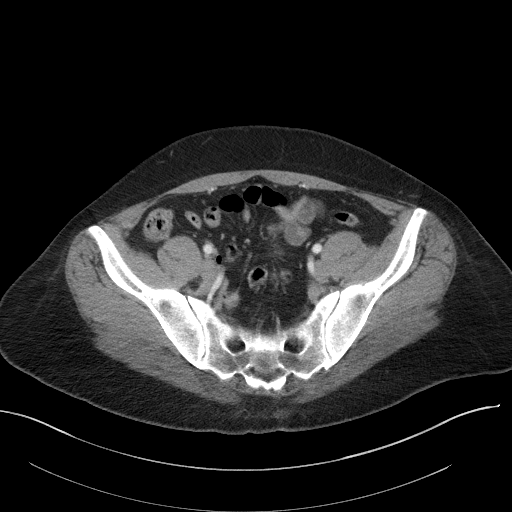
[im 41/93  soft-tissue]
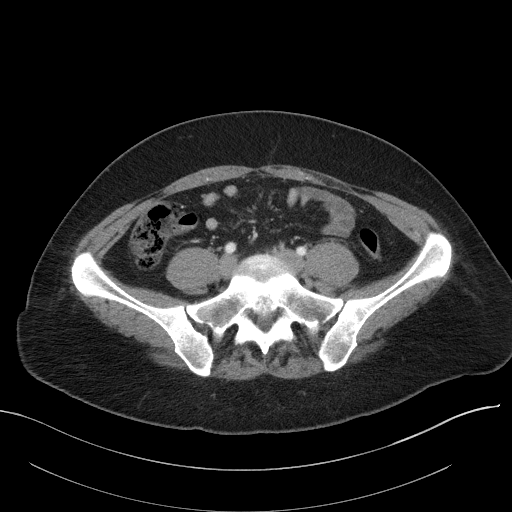
[im 48/93  soft-tissue]
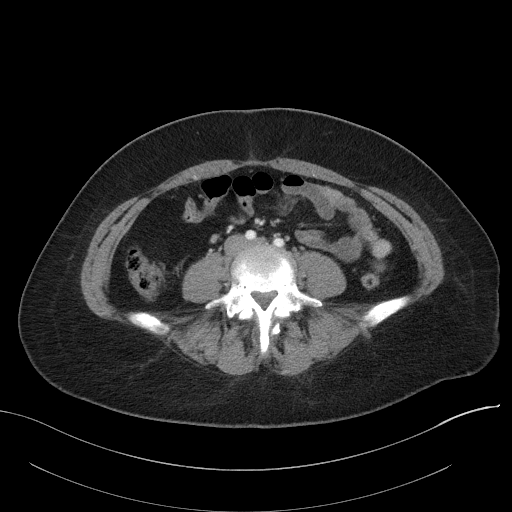
[im 52/93  soft-tissue]
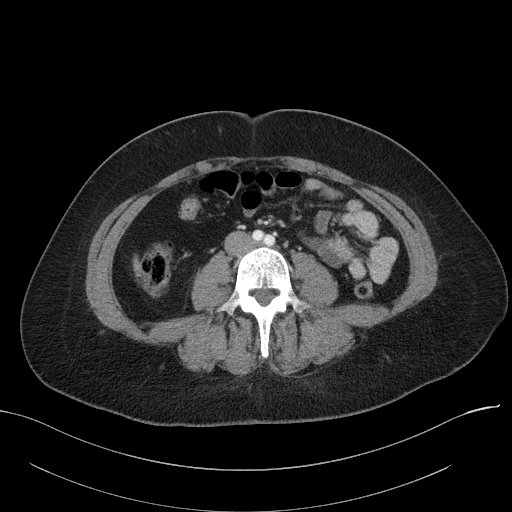
[im 59/93  soft-tissue]
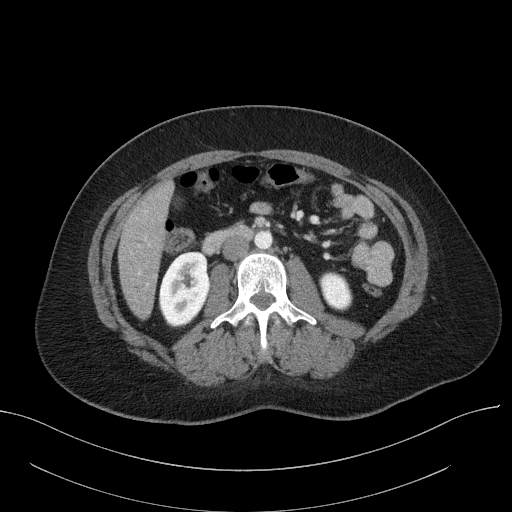
[im 59/93  bone]
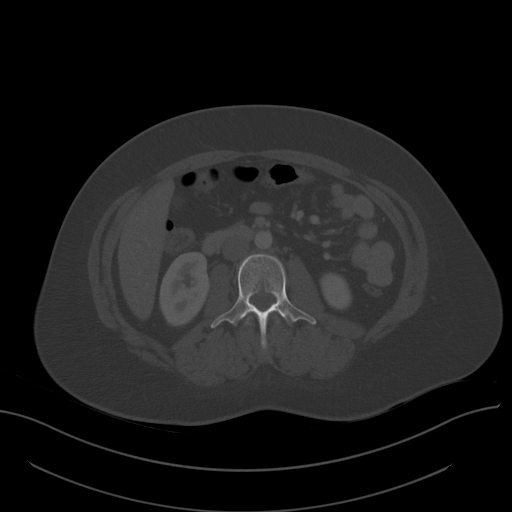
[im 67/93  soft-tissue]
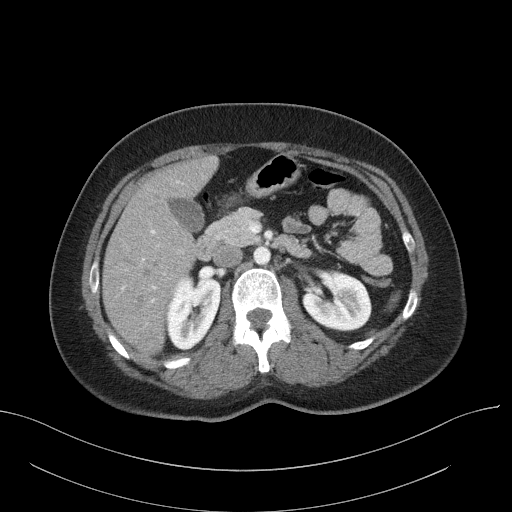
[im 74/93  soft-tissue]
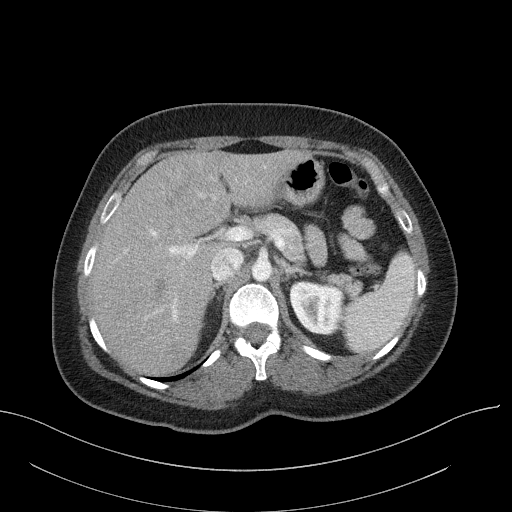
[im 81/93  soft-tissue]
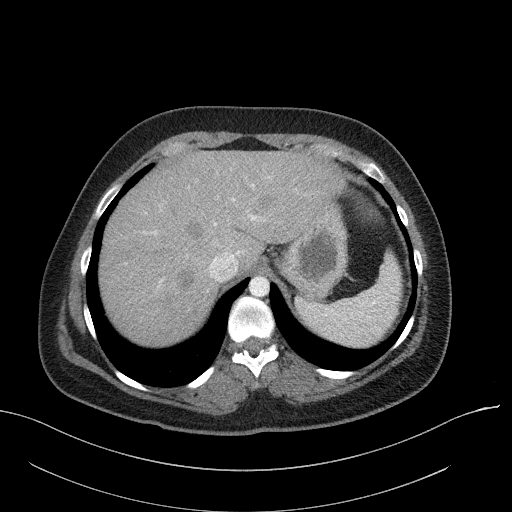
[im 89/93  soft-tissue]
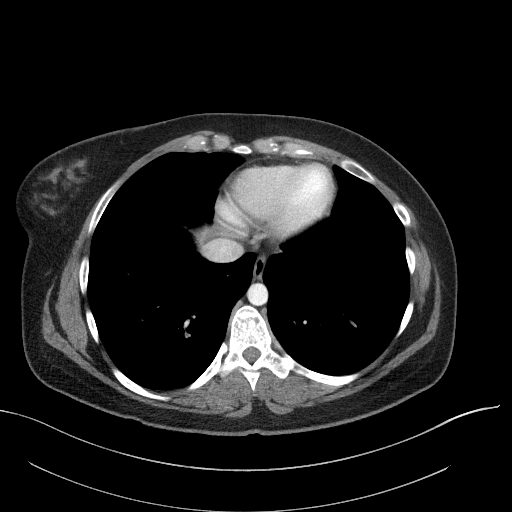

[Series 6: a/p w/ cor · coronal · 0.75mm/px · 3 of 151 slices shown]
[im 51/151  soft-tissue]
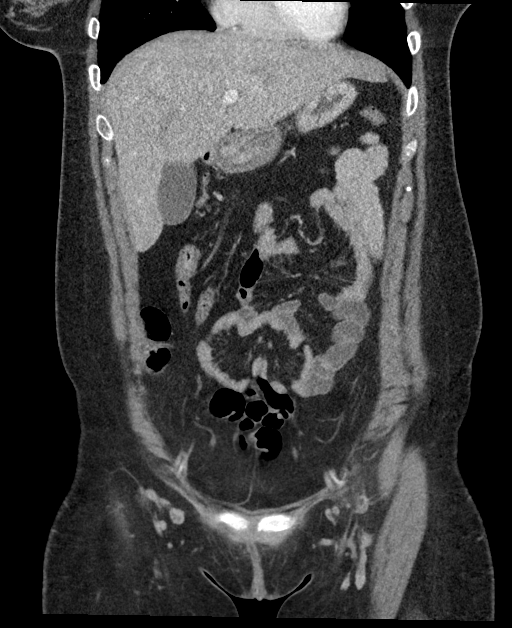
[im 67/151  soft-tissue]
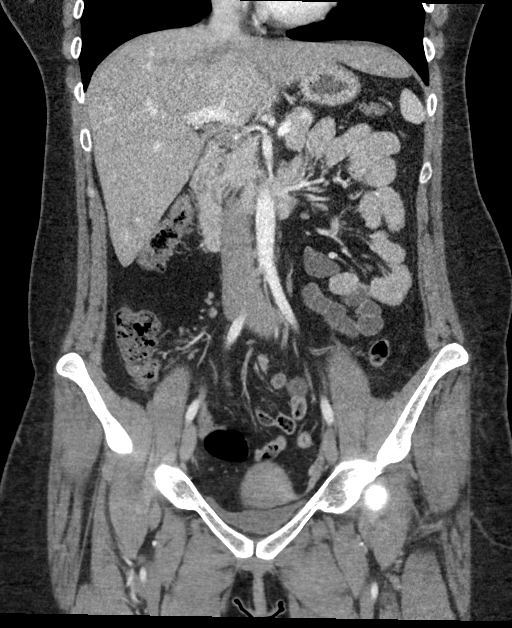
[im 84/151  soft-tissue]
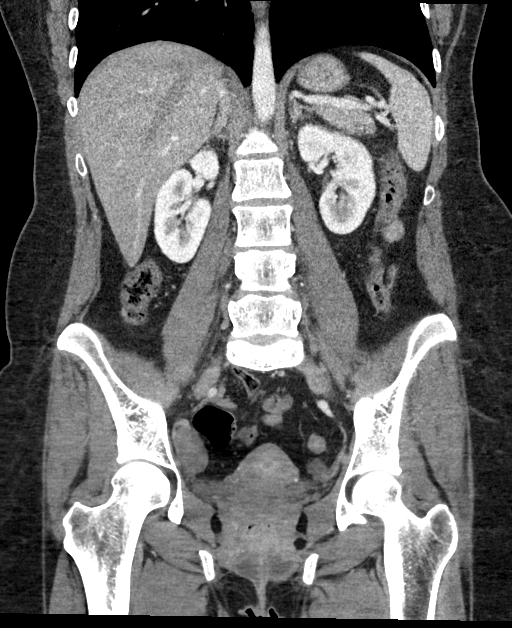

[16 of 46 positions shown; findings below may reference images not displayed]

FINDINGS: Lower chest: No acute abnormality.

Hepatobiliary: No focal liver abnormality is seen. No gallstones,
gallbladder wall thickening, or biliary dilatation.

Pancreas: Unremarkable. No pancreatic ductal dilatation or
surrounding inflammatory changes.

Spleen: Normal in size without focal abnormality.

Adrenals/Urinary Tract: Adrenal glands are unremarkable. Kidneys are
normal, without renal calculi, focal lesion, or hydronephrosis.
Bladder is decompressed.

Stomach/Bowel: Stomach is within normal limits. Appendix appears
normal. No evidence of bowel wall thickening, distention, or
inflammatory changes.

Vascular/Lymphatic: No significant vascular findings are present. No
enlarged abdominal or pelvic lymph nodes.

Reproductive: Uterus and bilateral adnexa are unremarkable.

Other: No abdominal wall hernia or abnormality. No abdominopelvic
ascites.

Musculoskeletal: No acute or significant osseous findings.
IMPRESSION: No acute abnormality noted.

## 2018-12-24 ENCOUNTER — Telehealth: Payer: Self-pay | Admitting: *Deleted

## 2018-12-24 NOTE — Telephone Encounter (Signed)
Patient informed that we are not allowing visitors or children to come to appointments at this time. Patient denies any contact with anyone suspected or confirmed of having COVID-19. Pt denies fever, cough, sob, muscle pain, diarrhea, rash, vomiting, abdominal pain, red eye, weakness, bruising or bleeding, joint pain or severe headache.  

## 2018-12-25 ENCOUNTER — Other Ambulatory Visit: Payer: Self-pay | Admitting: Obstetrics & Gynecology

## 2018-12-25 ENCOUNTER — Telehealth: Payer: Self-pay | Admitting: Obstetrics & Gynecology

## 2018-12-25 ENCOUNTER — Other Ambulatory Visit: Payer: Self-pay

## 2018-12-25 ENCOUNTER — Ambulatory Visit (INDEPENDENT_AMBULATORY_CARE_PROVIDER_SITE_OTHER): Payer: Medicaid Other | Admitting: Obstetrics & Gynecology

## 2018-12-25 ENCOUNTER — Encounter: Payer: Self-pay | Admitting: Obstetrics & Gynecology

## 2018-12-25 VITALS — BP 148/90 | HR 76 | Ht 68.0 in | Wt 234.0 lb

## 2018-12-25 DIAGNOSIS — R300 Dysuria: Secondary | ICD-10-CM | POA: Diagnosis not present

## 2018-12-25 DIAGNOSIS — Z9889 Other specified postprocedural states: Secondary | ICD-10-CM

## 2018-12-25 LAB — POCT URINALYSIS DIPSTICK
Blood, UA: NEGATIVE
Glucose, UA: NEGATIVE
Ketones, UA: NEGATIVE
Leukocytes, UA: NEGATIVE
Nitrite, UA: NEGATIVE
Protein, UA: NEGATIVE

## 2018-12-25 MED ORDER — HYDROCODONE-ACETAMINOPHEN 5-325 MG PO TABS: 1.0000 | ORAL_TABLET | Freq: Four times a day (QID) | ORAL | 0 refills | Status: DC | PRN

## 2018-12-25 NOTE — Telephone Encounter (Signed)
I did not need to look at the cervix again until the next visit in 4 weeks, it was to look at the healing of the treatment of the warts today.  If she had only had a cervical laser she would not have had an appointment today at all  I will give her a few pain tabs

## 2018-12-25 NOTE — Telephone Encounter (Signed)
Pt informed of Dr. Elonda Husky message.

## 2018-12-25 NOTE — Addendum Note (Signed)
Addended by: Christiana Pellant A on: 12/25/2018 10:47 AM   Modules accepted: Orders

## 2018-12-25 NOTE — Telephone Encounter (Signed)
Pt states that she failed to ask during her appointment today for a refill of her pain medication. She would like to see if that can be sent in. Patient also wanting to ask the nurse why Dr. Elonda Husky only did an outer exam and not a internal exam as well. Please advise.

## 2018-12-25 NOTE — Progress Notes (Signed)
  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  The patient's immediate postoperative recovery has been unremarkable. Since hospital discharge the patient reports pain, resolved gastritis.   Current Outpatient Medications: cephALEXin (KEFLEX) 500 MG capsule, Take 1 capsule (500 mg total) by mouth 3 (three) times daily for 3 days., Disp: 9 capsule, Rfl: 0 cyclobenzaprine (FLEXERIL) 10 MG tablet, Take 1 tablet (10 mg total) by mouth 3 (three) times daily., Disp: 20 tablet, Rfl: 0 dicyclomine (BENTYL) 10 MG capsule, Take 2 capsules (20 mg total) by mouth 3 (three) times daily before meals., Disp: 30 capsule, Rfl: 0 HYDROcodone-acetaminophen (NORCO/VICODIN) 5-325 MG tablet, Take 1 tablet by mouth every 6 (six) hours as needed., Disp: 15 tablet, Rfl: 0 ketorolac (TORADOL) 10 MG tablet, Take 1 tablet (10 mg total) by mouth every 8 (eight) hours as needed., Disp: 15 tablet, Rfl: 0 ondansetron (ZOFRAN ODT) 4 MG disintegrating tablet, Take 1 tablet (4 mg total) by mouth every 8 (eight) hours as needed for nausea or vomiting., Disp: 20 tablet, Rfl: 0 pantoprazole (PROTONIX) 40 MG tablet, Take 1 tablet (40 mg total) by mouth daily., Disp: 30 tablet, Rfl: 1 predniSONE (DELTASONE) 20 MG tablet, Take 2 tablets (40 mg total) by mouth daily with breakfast., Disp: 10 tablet, Rfl: 0 silver sulfADIAZINE (SILVADENE) 1 % cream, Apply 3 times a day, Disp: 50 g, Rfl: 11 dimenhyDRINATE (DRAMAMINE) 50 MG tablet, Take 50 mg by mouth as needed., Disp: , Rfl:  diphenhydrAMINE (BENADRYL) 25 MG tablet, Take 25 mg by mouth 2 (two) times daily as needed (headaches/allergies.)., Disp: , Rfl:   No current facility-administered medications for this visit.     Blood pressure (!) 142/80, pulse 79, height 5\' 8"  (1.727 m), weight 235 lb 12.8 oz (107 kg), last menstrual period 11/19/2018, not currently  breastfeeding.  Physical Exam: All of the laser "burns" are healing well no infection Using silvadene and looks good Cervical cone bed is healing well, additional Monsel's placed as a precaution  Diagnostic Tests:   Pathology: HSIL of the cervix  Impression: S/p laser ablation of large HSIL lesion and 15 condylomatous leions  Plan:   Follow up: 4  weeks  Florian Buff, MD

## 2018-12-27 LAB — URINE CULTURE

## 2019-01-08 ENCOUNTER — Telehealth: Payer: Self-pay | Admitting: Obstetrics & Gynecology

## 2019-01-08 NOTE — Telephone Encounter (Signed)
Pt wanting to be seen for a lump on her left nipple that is painful.

## 2019-01-11 NOTE — Telephone Encounter (Signed)
Called patient back and left message that I am returning her call.  

## 2019-01-21 ENCOUNTER — Encounter: Payer: Self-pay | Admitting: *Deleted

## 2019-01-22 ENCOUNTER — Other Ambulatory Visit: Payer: Self-pay

## 2019-01-22 ENCOUNTER — Ambulatory Visit (INDEPENDENT_AMBULATORY_CARE_PROVIDER_SITE_OTHER): Payer: Medicaid Other | Admitting: Obstetrics & Gynecology

## 2019-01-22 ENCOUNTER — Encounter: Payer: Self-pay | Admitting: Obstetrics & Gynecology

## 2019-01-22 VITALS — BP 134/89 | HR 93 | Ht 68.0 in | Wt 228.0 lb

## 2019-01-22 DIAGNOSIS — R87613 High grade squamous intraepithelial lesion on cytologic smear of cervix (HGSIL): Secondary | ICD-10-CM

## 2019-01-22 DIAGNOSIS — Z9889 Other specified postprocedural states: Secondary | ICD-10-CM

## 2019-01-22 NOTE — Progress Notes (Signed)
  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  The patient's immediate postoperative recovery has been unremarkable. Since hospital discharge the patient reports healing weel, premenstrual cramping and Montgomery gland draining.   Current Outpatient Medications: diphenhydrAMINE (BENADRYL) 25 MG tablet, Take 25 mg by mouth 2 (two) times daily as needed (headaches/allergies.)., Disp: , Rfl:  pantoprazole (PROTONIX) 40 MG tablet, Take 1 tablet (40 mg total) by mouth daily., Disp: 30 tablet, Rfl: 1  No current facility-administered medications for this visit.     Blood pressure 134/89, pulse 93, height 5\' 8"  (1.727 m), weight 228 lb (103.4 kg), not currently breastfeeding.  Physical Exam: Well healed vulva perineum and cervix  Diagnostic Tests:   Pathology: HSIL  Impression: S/p laser ablation of cervix and condyloma  Plan: Repeat Pap  Follow up: Return in about 6 months (around 07/25/2019) for Follow up Pap , with Dr Elonda Husky.     Florian Buff, MD

## 2019-02-15 IMAGING — US US ABDOMEN LIMITED
1 series · 14 of 25 positions shown · non-contrast
Comparison: CT chest, abdomen and pelvis 09/09/2016.

CLINICAL DATA: Right upper quadrant pain with nausea and vomiting
for 3 days.

EXAM:
ULTRASOUND ABDOMEN LIMITED RIGHT UPPER QUADRANT

[Series 1: us abdomen limited · 0.22mm/px · 14 of 38 slices shown]
[im 1/38]
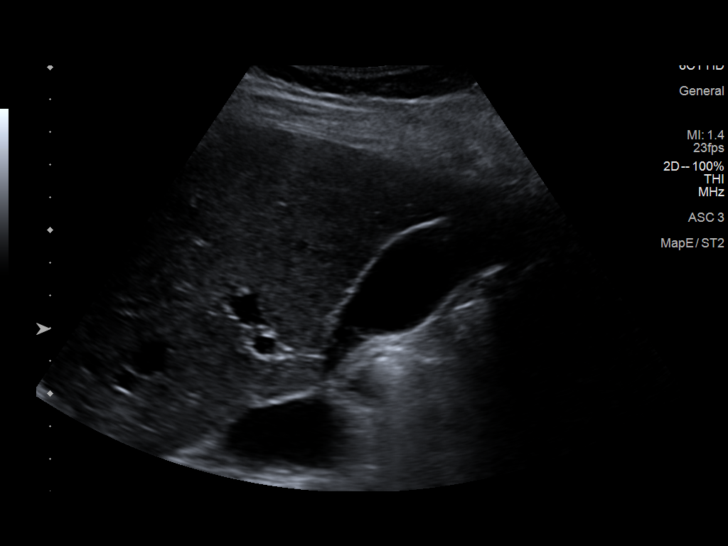
[im 4/38]
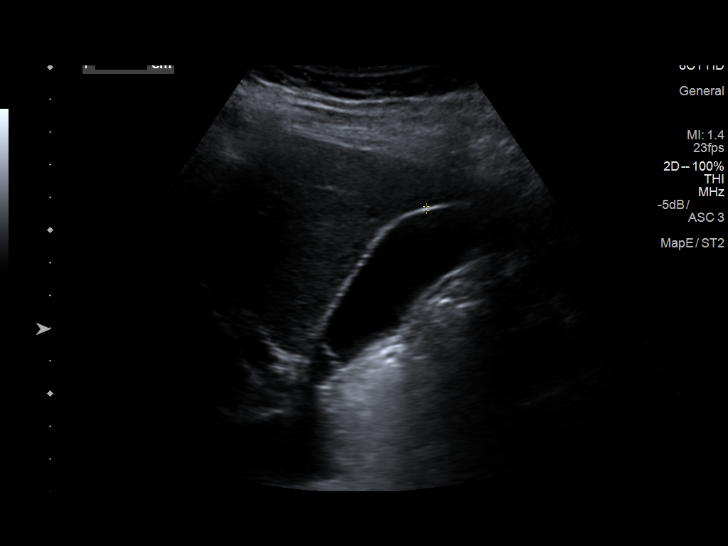
[im 7/38]
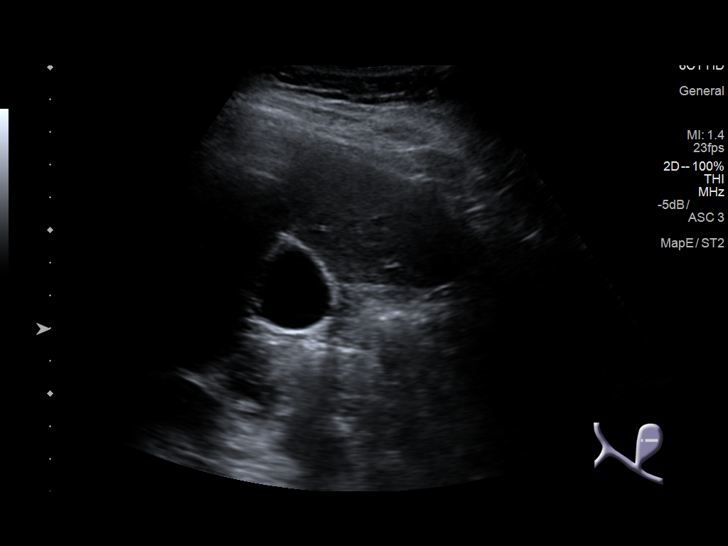
[im 10/38]
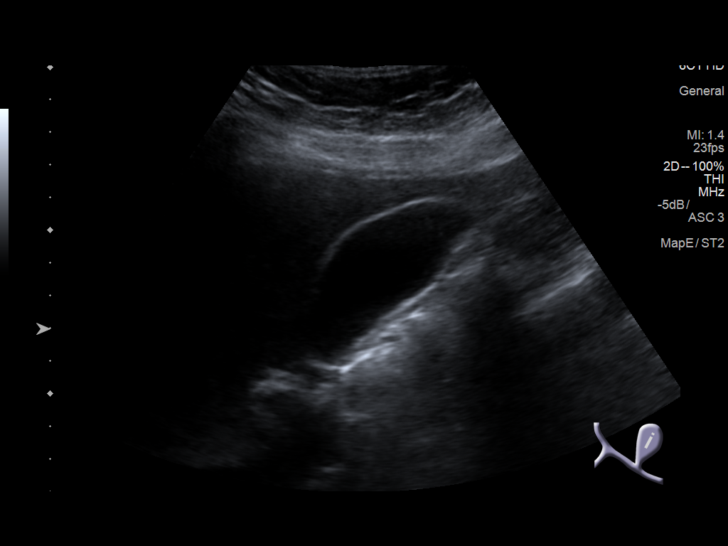
[im 13/38]
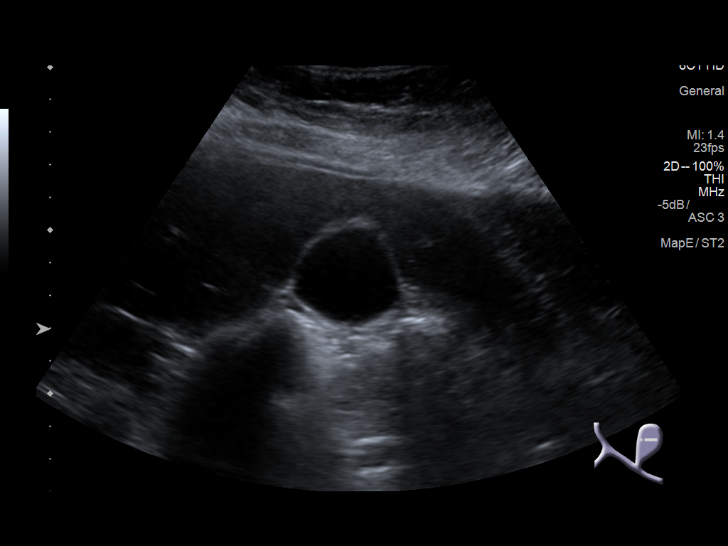
[im 14/38]
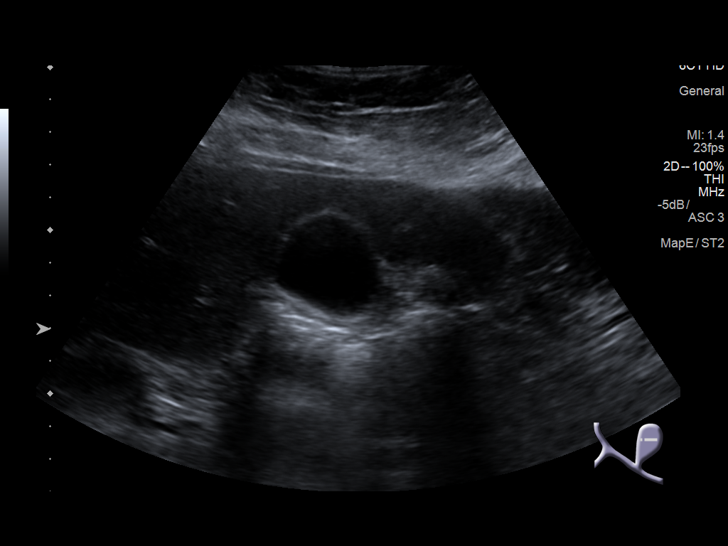
[im 17/38]
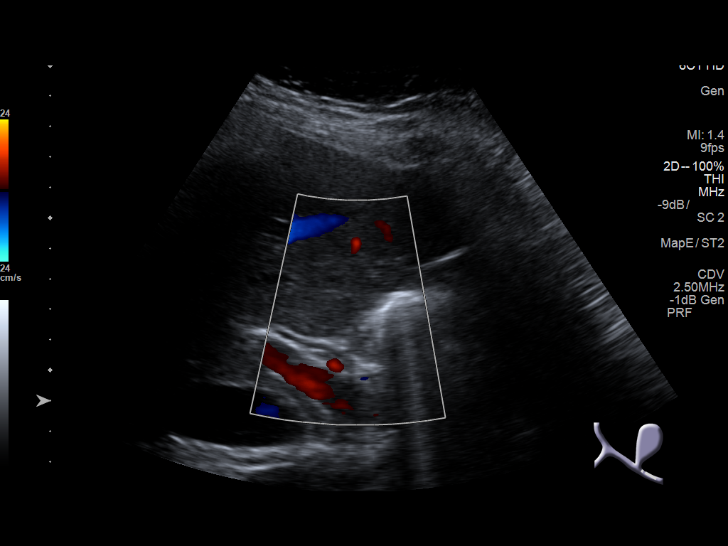
[im 21/38]
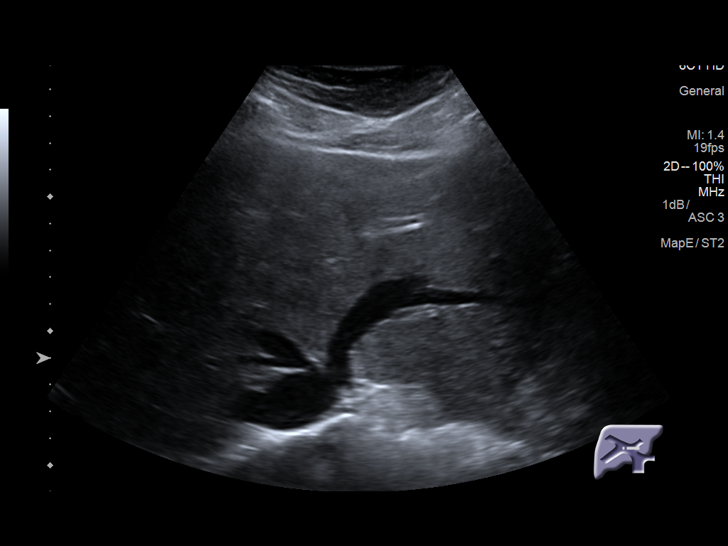
[im 24/38]
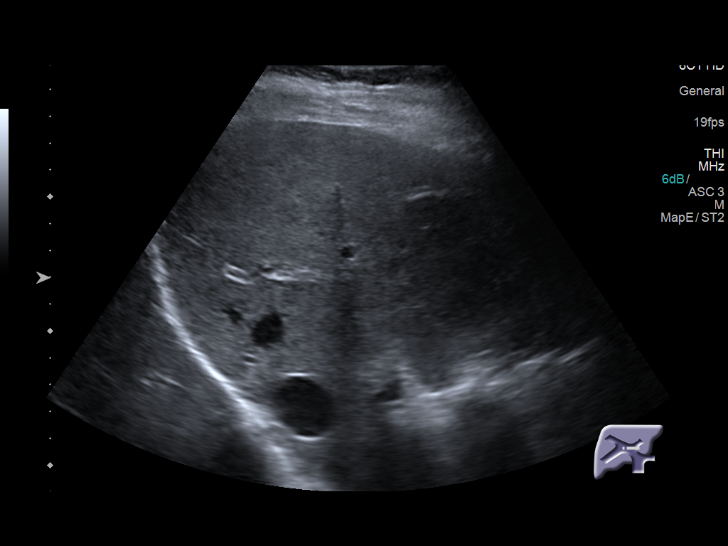
[im 25/38]
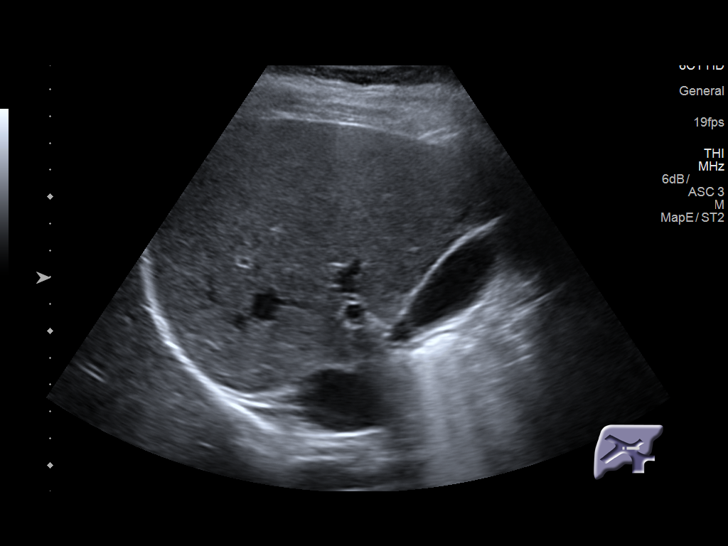
[im 28/38]
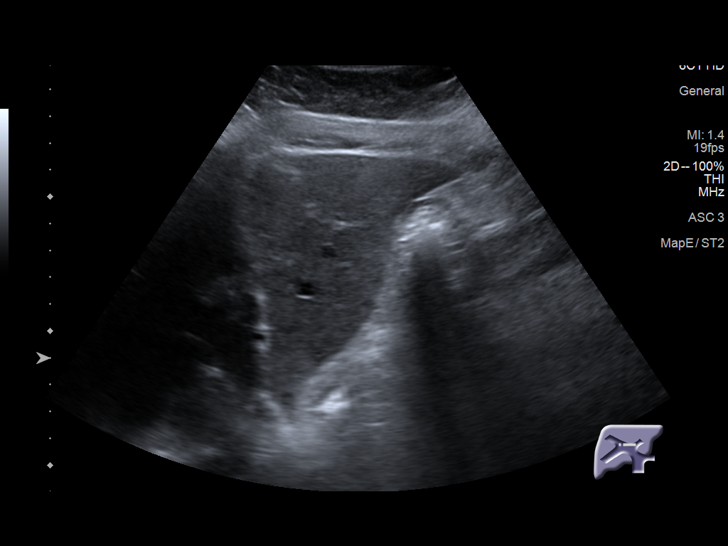
[im 31/38]
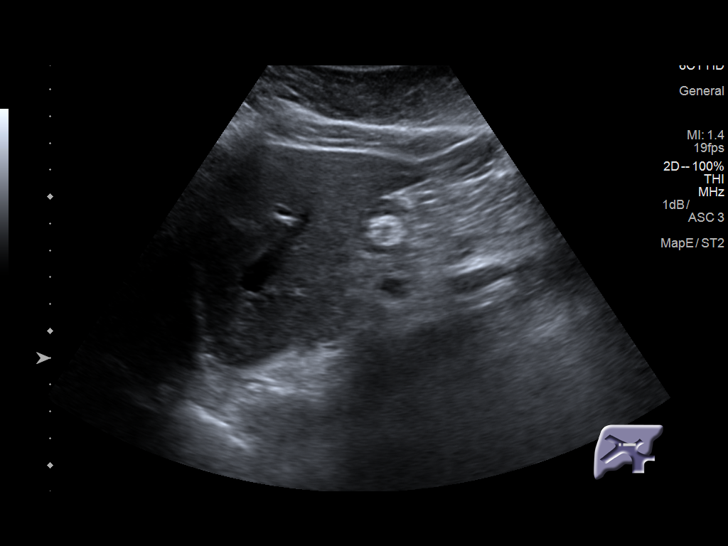
[im 34/38]
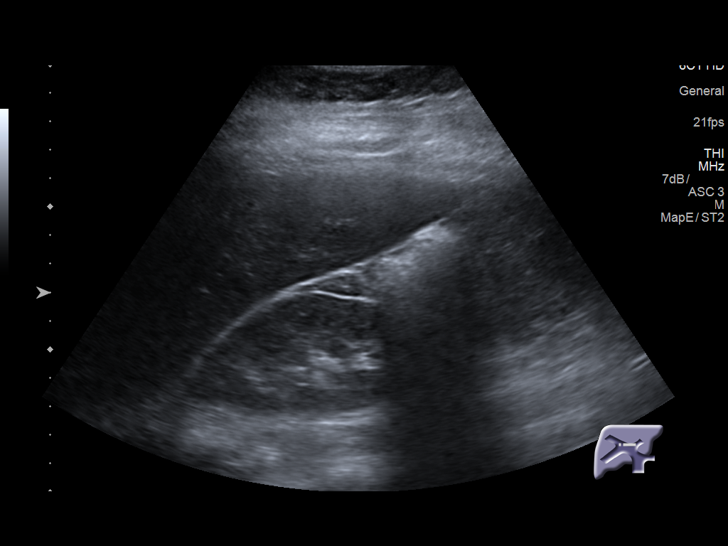
[im 38/38]
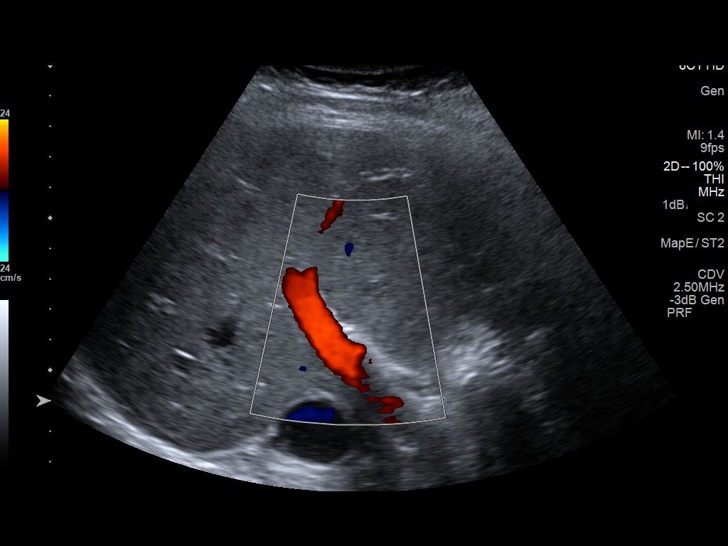

[14 of 25 positions shown; findings below may reference images not displayed]

FINDINGS: Gallbladder:

No gallstones or wall thickening visualized. No sonographic Murphy
sign noted by sonographer.

Common bile duct:

Diameter: 0.3 cm.

Liver:

No focal lesion identified. Within normal limits in parenchymal
echogenicity.
IMPRESSION: Negative exam.

## 2019-02-18 ENCOUNTER — Telehealth: Payer: Self-pay | Admitting: Obstetrics & Gynecology

## 2019-02-18 NOTE — Telephone Encounter (Signed)
Pt states since she had her nexplanon placed been bleeding a lot, having migraines, and having issues with her anxiety. Pt requesting an appt.

## 2019-02-18 NOTE — Telephone Encounter (Signed)
Pt scheduled with Anderson Malta. Aware that we may not be able to address all issues.

## 2019-02-18 NOTE — Telephone Encounter (Signed)
That is a lot to address and we may nt be able to take care of all of those issues, she needs to make an appointment to be seen in the office

## 2019-02-18 NOTE — Telephone Encounter (Signed)
Pt informed that bleeding is common side effect with Nexplanon if it bothers her the providers will sometimes prescribe something to help. Patient just states that she wanted to see if its normal.   She also requests that Dr. Elonda Husky prescribe something for migraines and for her anxiety. Advised that typically that would come from pcp but I would check and see. Patient does not have pcp or mental health provider.

## 2019-02-19 ENCOUNTER — Ambulatory Visit (INDEPENDENT_AMBULATORY_CARE_PROVIDER_SITE_OTHER): Payer: Medicaid Other | Admitting: Adult Health

## 2019-02-19 ENCOUNTER — Encounter: Payer: Self-pay | Admitting: Adult Health

## 2019-02-19 ENCOUNTER — Other Ambulatory Visit: Payer: Self-pay

## 2019-02-19 VITALS — BP 126/85 | HR 111 | Temp 98.7°F | Ht 68.0 in | Wt 219.0 lb

## 2019-02-19 DIAGNOSIS — G43009 Migraine without aura, not intractable, without status migrainosus: Secondary | ICD-10-CM | POA: Insufficient documentation

## 2019-02-19 DIAGNOSIS — Z975 Presence of (intrauterine) contraceptive device: Secondary | ICD-10-CM

## 2019-02-19 DIAGNOSIS — F329 Major depressive disorder, single episode, unspecified: Secondary | ICD-10-CM | POA: Diagnosis not present

## 2019-02-19 DIAGNOSIS — N921 Excessive and frequent menstruation with irregular cycle: Secondary | ICD-10-CM

## 2019-02-19 DIAGNOSIS — F419 Anxiety disorder, unspecified: Secondary | ICD-10-CM | POA: Diagnosis not present

## 2019-02-19 DIAGNOSIS — G43709 Chronic migraine without aura, not intractable, without status migrainosus: Secondary | ICD-10-CM | POA: Insufficient documentation

## 2019-02-19 HISTORY — DX: Migraine without aura, not intractable, without status migrainosus: G43.009

## 2019-02-19 HISTORY — DX: Presence of (intrauterine) contraceptive device: Z97.5

## 2019-02-19 HISTORY — DX: Excessive and frequent menstruation with irregular cycle: N92.1

## 2019-02-19 MED ORDER — PAROXETINE HCL 20 MG PO TABS: 20.0000 mg | ORAL_TABLET | Freq: Every day | ORAL | 2 refills | Status: DC

## 2019-02-19 MED ORDER — RIZATRIPTAN BENZOATE 10 MG PO TABS: 10.0000 mg | ORAL_TABLET | ORAL | 0 refills | Status: DC | PRN

## 2019-02-19 MED ORDER — TOPIRAMATE 25 MG PO CPSP: ORAL_CAPSULE | ORAL | 1 refills | Status: DC

## 2019-02-19 MED ORDER — PROMETHAZINE HCL 25 MG PO TABS: 25.0000 mg | ORAL_TABLET | Freq: Four times a day (QID) | ORAL | 1 refills | Status: DC | PRN

## 2019-02-19 NOTE — Patient Instructions (Signed)

## 2019-02-19 NOTE — Progress Notes (Addendum)
Patient ID: [name], female   DOB: 12-Aug-1991, 28 y.o.   MRN: [id number] History of Present Illness: Debbie Santana is a 28 year old white female, single, G2P1011, in complaining of anxiety and depression and migraine for 3 days.Has dots in vision field, decreased appetite and nausea at times. Has had depression in the past and was given celexa, did not like it.She is having some OCD tendencies, like excessive watching of hands and cleaning.Her partner cheated on her when she was pregnant and had some jail time too, but they are together and doing well.  PCP is [facility].  Current Medications, Allergies, Past Medical History, Past Surgical History, Family History and Social History were reviewed in [employer] record.     Review of Systems: +anxiety and depression +migraine headache for 3 days now, has had in the past and treated by Dr Debbie Santana with hydrocodone +decreased appetite and nausea +OCD actions  +irregular  bleeding with nexplanon at times     Physical Exam:BP 126/85 (BP Location: Left Arm, Patient Position: Sitting, Cuff Size: Large)   Pulse (!) 111   Temp 98.7 F (37.1 C)   Ht 5\' 8"  (1.727 m)   Wt 219 lb (99.3 kg)   LMP 01/30/2019   Breastfeeding No   BMI 33.30 kg/m   General:  Well developed, well nourished, no acute distress Skin:  Warm and dry Lungs; Clear to auscultation bilaterally Cardiovascular: Regular rate and rhythm CN 2-12 intact, has good strength upper extremities bilaterally Psych:  No mood changes, alert and cooperative,seems sad  PHQ 9 score is 17, denies being suicidal or homicidal and is open to meds, will try Paxil.  Impression and Plan; -Will Rx Paxil for anxiety and depression -will Rx Topamax 25 mg at bedtime Phenergan for nausea and maxalt as rescue med, and re assess in 3 weeks and if not better refer to neurologist -review handout on migraines   1. Anxiety and depression Meds ordered this encounter  Medications  .  promethazine (PHENERGAN) 25 MG tablet    Sig: Take 1 tablet (25 mg total) by mouth every 6 (six) hours as needed for nausea or vomiting.    Dispense:  30 tablet    Refill:  1    Order Specific Question:   Supervising Provider    Answer:   Debbie Santana, Debbie Santana [[id number]]  . topiramate (TOPAMAX) 25 MG capsule    Sig: Take 1 at bedtime    Dispense:  30 capsule    Refill:  1    Order Specific Question:   Supervising Provider    Answer:   Debbie Santana, Debbie Santana [[id number]]  . rizatriptan (MAXALT) 10 MG tablet    Sig: Take 1 tablet (10 mg total) by mouth as needed for migraine. May repeat in 2 hours if needed    Dispense:  10 tablet    Refill:  0    Order Specific Question:   Supervising Provider    Answer:   Debbie Santana, Debbie Santana [[id number]]  . PARoxetine (PAXIL) 20 MG tablet    Sig: Take 1 tablet (20 mg total) by mouth daily.    Dispense:  30 tablet    Refill:  2    Order Specific Question:   Supervising Provider    Answer:   Debbie Santana, Debbie Santana [[id number]]    2. Migraine without aura and without status migrainosus, not intractable   3. Irregular intermenstrual bleeding   4. Nexplanon in place

## 2019-03-12 ENCOUNTER — Ambulatory Visit (INDEPENDENT_AMBULATORY_CARE_PROVIDER_SITE_OTHER): Payer: Medicaid Other | Admitting: Adult Health

## 2019-03-12 ENCOUNTER — Encounter: Payer: Self-pay | Admitting: Adult Health

## 2019-03-12 ENCOUNTER — Other Ambulatory Visit: Payer: Self-pay

## 2019-03-12 VITALS — BP 128/87 | HR 96 | Ht 68.0 in | Wt 218.0 lb

## 2019-03-12 DIAGNOSIS — F329 Major depressive disorder, single episode, unspecified: Secondary | ICD-10-CM | POA: Diagnosis not present

## 2019-03-12 DIAGNOSIS — F419 Anxiety disorder, unspecified: Secondary | ICD-10-CM | POA: Diagnosis not present

## 2019-03-12 DIAGNOSIS — G43009 Migraine without aura, not intractable, without status migrainosus: Secondary | ICD-10-CM

## 2019-03-12 DIAGNOSIS — F32A Depression, unspecified: Secondary | ICD-10-CM

## 2019-03-12 MED ORDER — HYDROXYZINE PAMOATE 25 MG PO CAPS: 25.0000 mg | ORAL_CAPSULE | Freq: Three times a day (TID) | ORAL | 0 refills | Status: DC | PRN

## 2019-03-12 MED ORDER — BUSPIRONE HCL 7.5 MG PO TABS: 7.5000 mg | ORAL_TABLET | Freq: Three times a day (TID) | ORAL | 6 refills | Status: DC

## 2019-03-12 MED ORDER — RIZATRIPTAN BENZOATE 10 MG PO TABS: 10.0000 mg | ORAL_TABLET | ORAL | 3 refills | Status: DC | PRN

## 2019-03-12 NOTE — Progress Notes (Signed)
Patient ID: Debbie Santana, female   DOB: May 13, 1991, 28 y.o.   MRN: [id number] History of Present Illness: Debbie Santana is a 28 year old white female, G2P1011, back in follow up on starting paxil for anxiety and depression and is better. And topamax, kept her awake and she had bad dreams. PCP is [facility].   Current Medications, Allergies, Past Medical History, Past Surgical History, Family History and Social History were reviewed in [employer] [employer] record.     Review of Systems: Has had headaches but not migraines Bad dreams with Topamax and could not sleep Not as depressed, feels better but anxious at times, ?panic attack,(had one this morning in car with mom, felt tight in chest)    Physical Exam:BP 128/87 (BP Location: Left Arm, Patient Position: Sitting, Cuff Size: Normal)   Pulse 96   Ht 5\' 8"  (1.727 m)   Wt 218 lb (98.9 kg)   LMP 02/19/2019   BMI 33.15 kg/m  General:  Well developed, well nourished, no acute distress Skin:  Warm and dry Lungs; Clear to auscultation bilaterally Cardiovascular: Regular rate and rhythm Psych:  No mood changes, alert and cooperative,seems happy PHQ 9 score is 11, which is down from 29 on 02/19/19.   Impression: 1. Anxiety and depression   2. Migraine without aura and without status migrainosus, not intractable       Plan: Will stop Topamax, but use Maxalt as needed  Will continue Paxil and add Buspar to it and vistaril prn Make sure to stay hydrated  Meds ordered this encounter  Medications  . busPIRone (BUSPAR) 7.5 MG tablet    Sig: Take 1 tablet (7.5 mg total) by mouth 3 (three) times daily.    Dispense:  90 tablet    Refill:  6    Order Specific Question:   Supervising Provider    Answer:   Elonda Husky, LUTHER H [[id number]]  . hydrOXYzine (VISTARIL) 25 MG capsule    Sig: Take 1 capsule (25 mg total) by mouth 3 (three) times daily as needed.    Dispense:  30 capsule    Refill:  0    Order Specific Question:   Supervising  Provider    Answer:   Elonda Husky, LUTHER H [[id number]]  . rizatriptan (MAXALT) 10 MG tablet    Sig: Take 1 tablet (10 mg total) by mouth as needed for migraine. May repeat in 2 hours if needed    Dispense:  10 tablet    Refill:  3    Order Specific Question:   Supervising Provider    Answer:   Tania Ade H [[id number]]  Follow up in 4 weeks

## 2019-03-17 ENCOUNTER — Telehealth: Payer: Self-pay | Admitting: Adult Health

## 2019-03-17 NOTE — Telephone Encounter (Signed)
Spoke with pt. Pt don't feel like Hydroxyzine is working. Pt can't tell a difference when taking med. Pt don't feel suicidal but feels irritable.  Appetite has increased. Please advise. Thanks! [place]

## 2019-03-17 NOTE — Telephone Encounter (Signed)
Pt advised to give med a few more days per JAG. Advised needs to try med for 3-4 weeks before making a decision. Pt voiced understanding. [place]

## 2019-03-17 NOTE — Telephone Encounter (Signed)
Pt states that she does not think the hydrOXYzine (VISTARIL) 25 MG capsule Is working for her and would like another medication sent in.

## 2019-04-06 ENCOUNTER — Emergency Department (HOSPITAL_COMMUNITY)
Admission: EM | Admit: 2019-04-06 | Discharge: 2019-04-06 | Disposition: A | Discharge status: Home / Self Care | Payer: Medicaid Other | Attending: Emergency Medicine | Admitting: Emergency Medicine

## 2019-04-06 ENCOUNTER — Encounter (HOSPITAL_COMMUNITY): Payer: Self-pay

## 2019-04-06 ENCOUNTER — Other Ambulatory Visit: Payer: Self-pay

## 2019-04-06 DIAGNOSIS — H9202 Otalgia, left ear: Secondary | ICD-10-CM | POA: Diagnosis present

## 2019-04-06 DIAGNOSIS — M545 Low back pain, unspecified: Secondary | ICD-10-CM

## 2019-04-06 DIAGNOSIS — G8929 Other chronic pain: Secondary | ICD-10-CM | POA: Diagnosis not present

## 2019-04-06 DIAGNOSIS — H6692 Otitis media, unspecified, left ear: Secondary | ICD-10-CM | POA: Diagnosis not present

## 2019-04-06 DIAGNOSIS — H6690 Otitis media, unspecified, unspecified ear: Secondary | ICD-10-CM

## 2019-04-06 DIAGNOSIS — Z79899 Other long term (current) drug therapy: Secondary | ICD-10-CM | POA: Insufficient documentation

## 2019-04-06 DIAGNOSIS — F17210 Nicotine dependence, cigarettes, uncomplicated: Secondary | ICD-10-CM | POA: Insufficient documentation

## 2019-04-06 MED ORDER — CYCLOBENZAPRINE HCL 10 MG PO TABS: 10.0000 mg | ORAL_TABLET | Freq: Two times a day (BID) | ORAL | 0 refills | Status: DC | PRN

## 2019-04-06 MED ORDER — AMOXICILLIN-POT CLAVULANATE 875-125 MG PO TABS: 1.0000 | ORAL_TABLET | Freq: Two times a day (BID) | ORAL | 0 refills | Status: AC

## 2019-04-06 MED ORDER — HYDROCODONE-ACETAMINOPHEN 5-325 MG PO TABS
1.0000 | ORAL_TABLET | Freq: Once | ORAL | Status: AC
  Administered 2019-04-06: 13:00:00 1 via ORAL
  Filled 2019-04-06: qty 1

## 2019-04-06 MED ORDER — LIDOCAINE 5 % EX PTCH: 1.0000 | MEDICATED_PATCH | CUTANEOUS | 0 refills | Status: DC

## 2019-04-06 MED ORDER — PREDNISONE 10 MG (21) PO TBPK: ORAL_TABLET | Freq: Every day | ORAL | 0 refills | Status: DC

## 2019-04-06 NOTE — Discharge Instructions (Signed)
Please take all of your antibiotics until finished!   You may develop abdominal discomfort or diarrhea from the antibiotic.  You may help offset this with probiotics which you can buy or get in yogurt. Do not eat  or take the probiotics until 2 hours after your antibiotic.   I recommend that you quit smoking.  Do plenty fluids and get plenty of rest.  Also use warm water and salt gargles and warm teas for throat pain.  1. Medications: Take steroid taper as prescribed with food to avoid upset stomach issues.  Do not take ibuprofen, Advil, Aleve, or Motrin while taking this medicine.  You may take [phone] mg of Tylenol every 6 hours as needed for pain. Do not exceed 4000 mg of Tylenol daily.  You can take Flexeril as needed for muscle relaxation but this medication may make you drowsy so do not drive, drink alcohol, operate heavy machinery, or make important decisions while you are using this medicine.  I typically recommend only taking this medicine at night.  You can also cut these tablets in half if they are very strong. 2. Treatment: rest, ice, elevate and use brace, drink plenty of fluids, gentle stretching (see attached exercises, but you can also YouTube search physical therapy exercises).   3. Follow Up: Please followup with neurosurgery as directed or your PCP in 1 week if no improvement for discussion of your diagnoses and further evaluation after today's visit; if you do not have a primary care doctor use the resource guide provided to find one; Please return to the ER for worsening symptoms or other concerns such as difficulty breathing or swallowing, facial swelling, loss of control of bowel or bladder, weakness, or numbness.

## 2019-04-06 NOTE — ED Provider Notes (Signed)
[facility] EMERGENCY DEPARTMENT Provider Note   CSN: [id number] Arrival date & time: 04/06/19  1114    History   Chief Complaint Chief Complaint  Patient presents with  . Back Pain  . Sore Throat    HPI Debbie Santana is a 28 y.o. female with history of anxiety, chronic back pain, peptic ulcer disease presents for evaluation of multiple complaints.  She reports for the last week she has had left ear pain radiating to the left jaw and into the neck.  She feels as though she has some lymph nodes that are swollen.  She feels as though her throat is a little sore but denies any difficulty swallowing or drooling.  No fevers.  Symptoms worsen when she smokes a cigarette.  She has been taking Tylenol without relief of her symptoms.   Also complaining of acute onset, constant low back pain for the last 3 days.  She has a history of prior lumbar discectomy as a teenager and was told she has degenerative disc disease.  She had an MRI on 12/07/2018 that showed broad central/right paracentral disc protrusion at L3-4 with right lateral recess narrowing and broad central disc protrusion with mild bilateral lateral recess narrowing at L4-5.  She reports pain worsens with bending, standing.  Denies any radiation of pain down the extremities or numbness or weakness.  No fever, IV drug use, bowel or bladder incontinence, or saddle anesthesia.  She has been taking Tylenol and Excedrin without relief of her symptoms.  No known trauma recently but she does have a 22 pound child that she lifts frequently.     The history is provided by the patient.    Past Medical History:  Diagnosis Date  . Anxiety   . Bradycardia 04/17/2017  . Chronic back pain   . 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 Gastric ulcer   . Genital warts   . History of kidney stones   . Mental disorder   . Peptic ulcer disease 04/13/2017  . Sciatica   . Vaginal Pap smear, abnormal     Patient Active Problem List   Diagnosis Date Noted  .  Migraine without aura and without status migrainosus, not intractable 02/19/2019  . Anxiety and depression 02/19/2019  . Nexplanon in place 02/19/2019  . Irregular intermenstrual bleeding 02/19/2019  . Nausea & vomiting 12/07/2018  . Low back pain 12/07/2018  . Gastroenteritis 12/07/2018  . Acute lower UTI 12/07/2018  . Dehydration 12/07/2018  . 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 History of gestational hypertension 05/21/2018  . Condyloma 04/15/2018  . History of abnormal cervical Pap smear 11/04/2017  . Cigarette smoker 04/26/2017  . Reflux esophagitis 04/17/2017  . Obesity 04/17/2017  . Tobacco abuse 04/17/2017  . Peptic ulcer disease 04/13/2017  . 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 Intractable abdominal pain 04/11/2017  . Leukocytosis 04/11/2017  . Hyperglycemia 04/11/2017  . Anxiety 04/11/2017    Past Surgical History:  Procedure Laterality Date  . BACK SURGERY     ruptured disc.  . BIOPSY  04/14/2017   Procedure: BIOPSY;  Surgeon: Rogene Houston, MD;  Location: [facility];  Service: Endoscopy;;  gastric  . CERVICAL ABLATION N/A 12/02/2018   Procedure: LASER ABLATION OF CERVIX;  Surgeon: Florian Buff, MD;  Location: [facility];  Service: Gynecology;  Laterality: N/A;  . CHOLECYSTECTOMY N/A 04/17/2017   Procedure: LAPAROSCOPIC CHOLECYSTECTOMY;  Surgeon: Aviva Signs, MD;  Location: [facility];  Service: General;  Laterality: N/A;  . COLPOSCOPY    .  ESOPHAGOGASTRODUODENOSCOPY (EGD) WITH PROPOFOL N/A 04/14/2017   Procedure: ESOPHAGOGASTRODUODENOSCOPY (EGD) WITH PROPOFOL;  Surgeon: Rogene Houston, MD;  Location: [facility];  Service: Endoscopy;  Laterality: N/A;  . HERNIA REPAIR Bilateral    inguinal  . LASER ABLATION CONDOLAMATA N/A 12/02/2018   Procedure: LASER ABLATION CONDYLOMA ACCUMINATA LEFT AND RIGHT VULVA, PERINEUM AND PERIANAL (15 TOTAL);  Surgeon: Florian Buff, MD;  Location: [facility];  Service: Gynecology;  Laterality: N/A;     OB History    Gravida  2   Para   1   Term  1   Preterm      AB  1   Living  1     SAB  1   TAB      Ectopic      Multiple  0   Live Births  1            Home Medications    Prior to Admission medications   Medication Sig Start Date End Date Taking? Authorizing Provider  amoxicillin-clavulanate (AUGMENTIN) 875-125 MG tablet Take 1 tablet by mouth every 12 (twelve) hours for 7 days. 04/06/19 04/13/19  Rodell Perna A, PA-C  Aspirin-Acetaminophen-Caffeine (EXCEDRIN MIGRAINE PO) Take by mouth as needed.    [provider]  busPIRone (BUSPAR) 7.5 MG tablet Take 1 tablet (7.5 mg total) by mouth 3 (three) times daily. 03/12/19   Estill Dooms, NP  cyclobenzaprine (FLEXERIL) 10 MG tablet Take 1 tablet (10 mg total) by mouth 2 (two) times daily as needed for muscle spasms. 04/06/19   Leta Bucklin A, PA-C  diphenhydrAMINE (BENADRYL) 25 MG tablet Take 25 mg by mouth 2 (two) times daily as needed (headaches/allergies.).    [provider]  etonogestrel (NEXPLANON) 68 MG IMPL implant 1 each by Subdermal route once.    [provider]  hydrOXYzine (VISTARIL) 25 MG capsule Take 1 capsule (25 mg total) by mouth 3 (three) times daily as needed. 03/12/19   Estill Dooms, NP  lidocaine (LIDODERM) 5 % Place 1 patch onto the skin daily. Remove & Discard patch within 12 hours or as directed by MD 04/06/19   Rodell Perna A, PA-C  Omeprazole Magnesium (PRILOSEC OTC PO) Take by mouth daily.    [provider]  PARoxetine (PAXIL) 20 MG tablet Take 1 tablet (20 mg total) by mouth daily. 02/19/19   Estill Dooms, NP  predniSONE (STERAPRED UNI-PAK 21 TAB) 10 MG (21) TBPK tablet Take by mouth daily. Take 6 tabs by mouth on day 1, then 5 tabs on day 2, then 4 tabs on day 3, then 3 tabs on day 4, 2 tabs on day 5, then 1 tab on day 6 04/06/19   Nils Flack, Tenna Lacko A, PA-C  promethazine (PHENERGAN) 25 MG tablet Take 1 tablet (25 mg total) by mouth every 6 (six) hours as needed for nausea or vomiting.  02/19/19   Estill Dooms, NP  rizatriptan (MAXALT) 10 MG tablet Take 1 tablet (10 mg total) by mouth as needed for migraine. May repeat in 2 hours if needed 03/12/19   Estill Dooms, NP    Family History Family History  Problem Relation Age of Onset  . Anxiety disorder Mother   . Hyperlipidemia Mother   . Diabetes Maternal Grandfather   . Diabetes Cousin   . Learning disabilities Cousin     Social History Social History   Tobacco Use  . Smoking status: Current Every Day Smoker    Packs/day:  0.50    Years: 5.00    Pack years: 2.50    Types: Cigarettes  . Smokeless tobacco: Never Used  Substance Use Topics  . Alcohol use: No  . Drug use: No     Allergies   Patient has no known allergies.   Review of Systems Review of Systems  Constitutional: Negative for fever.  HENT: Positive for ear pain and sore throat. Negative for ear discharge.   Musculoskeletal: Positive for back pain. Negative for neck stiffness.  Neurological: Negative for weakness and numbness.  All other systems reviewed and are negative.    Physical Exam Updated Vital Signs BP 109/69   Pulse 79   Temp 98.3 F (36.8 C) (Oral)   Resp 18   Ht 5\' 8"  (1.727 m)   Wt 98.4 kg   LMP 03/31/2019   SpO2 99%   BMI 32.99 kg/m   Physical Exam Vitals signs and nursing note reviewed.  Constitutional:      General: She is not in acute distress.    Appearance: She is well-developed.  HENT:     Head: Normocephalic and atraumatic.     Right Ear: A middle ear effusion is present. Tympanic membrane is erythematous.     Left Ear: A middle ear effusion is present. Tympanic membrane is erythematous.     Ears:     Comments: Erythema and bulging of the TMs bilaterally.  No mastoid tenderness or erythema of the pinna.  No canal edema or drainage.    Nose: No congestion or rhinorrhea.     Comments: No frontal or maxillary sinus tenderness.    Mouth/Throat:     Pharynx: Uvula midline. No pharyngeal  swelling or posterior oropharyngeal erythema.     Tonsils: No tonsillar exudate or tonsillar abscesses. 1+ on the right. 1+ on the left.     Comments: Speaking in full sentences without difficulty, tolerating oral secretions.  Mouth opens to at least 3 finger widths and there is no trismus, sublingual abnormalities, uvular deviation, exudates, or erythema. Eyes:     General:        Right eye: No discharge.        Left eye: No discharge.     Conjunctiva/sclera: Conjunctivae normal.  Neck:     Musculoskeletal: Normal range of motion and neck supple.     Thyroid: No thyromegaly.     Vascular: No JVD.     Trachea: No tracheal deviation.     Comments: Left anterior cervical lymphadenopathy.  Normal range of motion Cardiovascular:     Rate and Rhythm: Normal rate.     Comments: 2+ radial and DP/PT pulses bilaterally, no lower extremity edema. Pulmonary:     Effort: Pulmonary effort is normal.  Abdominal:     General: There is no distension.     Palpations: Abdomen is soft.  Musculoskeletal:     Comments: No midline lumbar spine or paralumbar muscle tenderness.  No deformity, crepitus, or step-off noted.  No SI joint tenderness.  Pain elicited with flexion extension of lumbar spine and with passive flexion of the lower extremities.  5/5 strength of BLE major muscle groups.  Lymphadenopathy:     Cervical: Cervical adenopathy present.  Skin:    General: Skin is warm and dry.     Findings: No erythema.  Neurological:     Mental Status: She is alert.     Comments: Fluent speech, no facial droop, sensation intact to soft touch of bilateral lower extremities.  Ambulates  with a mildly antalgic a but able to heel walk and toe walk without difficulty and exhibits good balance.  Psychiatric:        Behavior: Behavior normal.      ED Treatments / Results  Labs (all labs ordered are listed, but only abnormal results are displayed) Labs Reviewed - No data to display  EKG None  Radiology No  results found.  Procedures Procedures (including critical care time)  Medications Ordered in ED Medications  HYDROcodone-acetaminophen (NORCO/VICODIN) 5-325 MG per tablet 1 tablet (1 tablet Oral Given 04/06/19 1315)     Initial Impression / Assessment and Plan / ED Course  I have reviewed the triage vital signs and the nursing notes.  Pertinent labs & imaging results that were available during my care of the patient were reviewed by me and considered in my medical decision making (see chart for details).        Patient with ear pain for 1 week and low back pain for 3 days.  She is afebrile, vital signs are at patient's baseline (heart rate appears to be intermittently tachycardic/on the upper limit of normal per chart review and improved on reevaluation).  She is nontoxic in appearance, resting comfortably in bed.  Ambulatory without difficulty and would like signs concerning for cauda equina or spinal abscess.  No recent history of trauma.  Discharge with course of steroids, Flexeril, discussed appropriate use of these medications.  She is a history of degenerative disc disease, prior lumbar discectomy and will require follow-up with a neurosurgeon or orthopedist for reevaluation.  We discussed physical therapy, gentle stretching, heat therapy.  With regards to her ear pain she has evidence of acute otitis media with associated lymphadenopathy.  No evidence of retropharyngeal abscess, strep pharyngitis, deep space neck infection, malignant otitis externa, or mastoiditis.  Will discharge with course of antibiotics.  Recommend follow-up with PCP for reevaluation of symptoms.  Discussed strict ED return precautions. Patient verbalized understanding of and agreement with plan and is safe for discharge home at this time.   Final Clinical Impressions(s) / ED Diagnoses   Final diagnoses:  Acute otitis media, unspecified otitis media type  Acute bilateral low back pain without sciatica    ED  Discharge Orders         Ordered    amoxicillin-clavulanate (AUGMENTIN) 875-125 MG tablet  Every 12 hours     04/06/19 1259    predniSONE (STERAPRED UNI-PAK 21 TAB) 10 MG (21) TBPK tablet  Daily     04/06/19 1259    cyclobenzaprine (FLEXERIL) 10 MG tablet  2 times daily PRN     04/06/19 1259    lidocaine (LIDODERM) 5 %  Every 24 hours     04/06/19 1259           Renita Papa, PA-C 04/06/19 1627    Margette Fast, MD 04/07/19 [phone]

## 2019-04-06 NOTE — ED Triage Notes (Signed)
Pt has been having lower back pain for the past 3 days. Pt has a history of a bulging disc. Pt is also having swelling in her lymph nodes. Hurts worse when she smokes a cigarette.

## 2019-04-09 ENCOUNTER — Ambulatory Visit (INDEPENDENT_AMBULATORY_CARE_PROVIDER_SITE_OTHER): Payer: Medicaid Other | Admitting: Adult Health

## 2019-04-09 ENCOUNTER — Encounter: Payer: Self-pay | Admitting: Adult Health

## 2019-04-09 ENCOUNTER — Other Ambulatory Visit: Payer: Self-pay

## 2019-04-09 VITALS — BP 134/90 | HR 103 | Ht 68.0 in | Wt 220.0 lb

## 2019-04-09 DIAGNOSIS — F329 Major depressive disorder, single episode, unspecified: Secondary | ICD-10-CM | POA: Diagnosis not present

## 2019-04-09 DIAGNOSIS — F419 Anxiety disorder, unspecified: Secondary | ICD-10-CM

## 2019-04-09 DIAGNOSIS — M5440 Lumbago with sciatica, unspecified side: Secondary | ICD-10-CM

## 2019-04-09 DIAGNOSIS — G8929 Other chronic pain: Secondary | ICD-10-CM

## 2019-04-09 MED ORDER — CYCLOBENZAPRINE HCL 10 MG PO TABS: 10.0000 mg | ORAL_TABLET | Freq: Two times a day (BID) | ORAL | 1 refills | Status: DC | PRN

## 2019-04-09 MED ORDER — PAROXETINE HCL 20 MG PO TABS: 20.0000 mg | ORAL_TABLET | Freq: Every day | ORAL | 6 refills | Status: DC

## 2019-04-09 MED ORDER — HYDROXYZINE PAMOATE 25 MG PO CAPS: 25.0000 mg | ORAL_CAPSULE | Freq: Three times a day (TID) | ORAL | 1 refills | Status: DC | PRN

## 2019-04-09 NOTE — Patient Instructions (Signed)
Acute Back Pain, Adult Acute back pain is sudden and usually short-lived. It is often caused by an injury to the muscles and tissues in the back. The injury may result from:  A muscle or ligament getting overstretched or torn (strained). Ligaments are tissues that connect bones to each other. Lifting something improperly can cause a back strain.  Wear and tear (degeneration) of the spinal disks. Spinal disks are circular tissue that provides cushioning between the bones of the spine (vertebrae).  Twisting motions, such as while playing sports or doing yard work.  A hit to the back.  Arthritis. You may have a physical exam, lab tests, and imaging tests to find the cause of your pain. Acute back pain usually goes away with rest and home care. Follow these instructions at home: Managing pain, stiffness, and swelling  Take over-the-counter and prescription medicines only as told by your health care provider.  Your health care provider may recommend applying ice during the first 24-48 hours after your pain starts. To do this: ? Put ice in a plastic bag. ? Place a towel between your skin and the bag. ? Leave the ice on for 20 minutes, 2-3 times a day.  If directed, apply heat to the affected area as often as told by your health care provider. Use the heat source that your health care provider recommends, such as a moist heat pack or a heating pad. ? Place a towel between your skin and the heat source. ? Leave the heat on for 20-30 minutes. ? Remove the heat if your skin turns bright red. This is especially important if you are unable to feel pain, heat, or cold. You have a greater risk of getting burned. Activity   Do not stay in bed. Staying in bed for more than 1-2 days can delay your recovery.  Sit up and stand up straight. Avoid leaning forward when you sit, or hunching over when you stand. ? If you work at a desk, sit close to it so you do not need to lean over. Keep your chin tucked  in. Keep your neck drawn back, and keep your elbows bent at a right angle. Your arms should look like the letter "L." ? Sit high and close to the steering wheel when you drive. Add lower back (lumbar) support to your car seat, if needed.  Take short walks on even surfaces as soon as you are able. Try to increase the length of time you walk each day.  Do not sit, drive, or stand in one place for more than 30 minutes at a time. Sitting or standing for long periods of time can put stress on your back.  Do not drive or use heavy machinery while taking prescription pain medicine.  Use proper lifting techniques. When you bend and lift, use positions that put less stress on your back: ? Bend your knees. ? Keep the load close to your body. ? Avoid twisting.  Exercise regularly as told by your health care provider. Exercising helps your back heal faster and helps prevent back injuries by keeping muscles strong and flexible.  Work with a physical therapist to make a safe exercise program, as recommended by your health care provider. Do any exercises as told by your physical therapist. Lifestyle  Maintain a healthy weight. Extra weight puts stress on your back and makes it difficult to have good posture.  Avoid activities or situations that make you feel anxious or stressed. Stress and anxiety increase muscle   tension and can make back pain worse. Learn ways to manage anxiety and stress, such as through exercise. General instructions  Sleep on a firm mattress in a comfortable position. Try lying on your side with your knees slightly bent. If you lie on your back, put a pillow under your knees.  Follow your treatment plan as told by your health care provider. This may include: ? Cognitive or behavioral therapy. ? Acupuncture or massage therapy. ? Meditation or yoga. Contact a health care provider if:  You have pain that is not relieved with rest or medicine.  You have increasing pain going down  into your legs or buttocks.  Your pain does not improve after 2 weeks.  You have pain at night.  You lose weight without trying.  You have a fever or chills. Get help right away if:  You develop new bowel or bladder control problems.  You have unusual weakness or numbness in your arms or legs.  You develop nausea or vomiting.  You develop abdominal pain.  You feel faint. Summary  Acute back pain is sudden and usually short-lived.  Use proper lifting techniques. When you bend and lift, use positions that put less stress on your back.  Take over-the-counter and prescription medicines and apply heat or ice as directed by your health care provider. This information is not intended to replace advice given to you by your health care provider. Make sure you discuss any questions you have with your health care provider. Document Released: 09/09/2005 Document Revised: 12/29/2018 Document Reviewed: 04/23/2017 Elsevier Patient Education  2020 [employer]  

## 2019-04-09 NOTE — Progress Notes (Signed)
Patient ID: Debbie Santana, female   DOB: 1991/08/04, 28 y.o.   MRN: [id number] History of Present Illness: Debbie Santana is a 28 year old white female, G2P1011, back in follow on Buspar, Paxil and vistaril and is feeling better, but has low back pain and it radiates to hips.She had back surgery at about age 3 she says by Dr Sherwood Gambler  PCP is [facility].   Current Medications, Allergies, Past Medical History, Past Surgical History, Family History and Social History were reviewed in [facility] record.     Review of Systems: She feels better mentally and is smiling +low back, has ben worse since ablation, had MR lumbar spine has protruding disc L3-4 and L4-5, she said she had back surgery at about age 28     Physical Exam:BP 134/90 (BP Location: Left Arm, Patient Position: Sitting, Cuff Size: Normal)   Pulse (!) 103   Ht 5\' 8"  (1.727 m)   Wt 220 lb (99.8 kg)   LMP 03/31/2019   BMI 33.45 kg/m  General:  Well developed, well nourished, no acute distress Skin:  Warm and dry Lungs; Clear to auscultation bilaterally Cardiovascular: Regular rate and rhythm Psych:  No mood changes, alert and cooperative,seems happy PHQ 9 score is 3, which is better thatn 11 6/19 and 17 on 5/29 Fall risk is low   Impression: 1. Anxiety and depression   2. Chronic midline low back pain with sciatica, sciatica laterality unspecified       Plan: Continue buspar, has refills and paxil and vistaril Meds ordered this encounter  Medications  . cyclobenzaprine (FLEXERIL) 10 MG tablet    Sig: Take 1 tablet (10 mg total) by mouth 2 (two) times daily as needed for muscle spasms.    Dispense:  30 tablet    Refill:  1    Order Specific Question:   Supervising Provider    Answer:   Elonda Husky, LUTHER H [[id number]]  . PARoxetine (PAXIL) 20 MG tablet    Sig: Take 1 tablet (20 mg total) by mouth daily.    Dispense:  30 tablet    Refill:  6    Order Specific Question:   Supervising Provider    Answer:   Elonda Husky,  LUTHER H [[id number]]  . hydrOXYzine (VISTARIL) 25 MG capsule    Sig: Take 1 capsule (25 mg total) by mouth 3 (three) times daily as needed.    Dispense:  90 capsule    Refill:  1    Order Specific Question:   Supervising Provider    Answer:   Tania Ade H [[id number]]  Will rx flexeril and use ice and call Dr Sherwood Gambler  Try to get lidocaine patch, rx from ER Follow up in 3 months or sooner if needed.

## 2019-05-17 ENCOUNTER — Other Ambulatory Visit: Payer: Self-pay

## 2019-05-17 ENCOUNTER — Other Ambulatory Visit: Payer: Medicaid Other | Admitting: Advanced Practice Midwife

## 2019-05-17 ENCOUNTER — Encounter: Payer: Self-pay | Admitting: Advanced Practice Midwife

## 2019-05-17 ENCOUNTER — Ambulatory Visit: Payer: Medicaid Other | Admitting: Advanced Practice Midwife

## 2019-05-17 VITALS — BP 130/82 | HR 93 | Ht 68.0 in | Wt 237.0 lb

## 2019-05-17 NOTE — Progress Notes (Signed)
ERROR--pt is 2 months early for 6 month f/u pap after laser surgery  Will reschedule for November per [facility] notes

## 2019-06-22 ENCOUNTER — Emergency Department (HOSPITAL_COMMUNITY)
Admission: EM | Admit: 2019-06-22 | Discharge: 2019-06-22 | Disposition: A | Discharge status: Home / Self Care | Payer: Medicaid Other | Attending: Emergency Medicine | Admitting: Emergency Medicine

## 2019-06-22 ENCOUNTER — Encounter (HOSPITAL_COMMUNITY): Payer: Self-pay

## 2019-06-22 ENCOUNTER — Other Ambulatory Visit: Payer: Self-pay

## 2019-06-22 DIAGNOSIS — F17210 Nicotine dependence, cigarettes, uncomplicated: Secondary | ICD-10-CM | POA: Diagnosis not present

## 2019-06-22 DIAGNOSIS — M62838 Other muscle spasm: Secondary | ICD-10-CM | POA: Diagnosis not present

## 2019-06-22 DIAGNOSIS — M542 Cervicalgia: Secondary | ICD-10-CM | POA: Diagnosis present

## 2019-06-22 MED ORDER — ACETAMINOPHEN 500 MG PO TABS
1000.0000 mg | ORAL_TABLET | Freq: Once | ORAL | Status: AC
  Administered 2019-06-22: 1000 mg via ORAL
  Filled 2019-06-22: qty 2

## 2019-06-22 MED ORDER — DIAZEPAM 2 MG PO TABS
2.0000 mg | ORAL_TABLET | Freq: Once | ORAL | Status: AC
  Administered 2019-06-22: 2 mg via ORAL
  Filled 2019-06-22: qty 1

## 2019-06-22 MED ORDER — LIDOCAINE 5 % EX PTCH
1.0000 | MEDICATED_PATCH | CUTANEOUS | Status: DC
  Administered 2019-06-22: 1 via TRANSDERMAL
  Filled 2019-06-22: qty 1

## 2019-06-22 MED ORDER — DICLOFENAC SODIUM 1 % TD GEL: 4.0000 g | Freq: Four times a day (QID) | TRANSDERMAL | 0 refills | Status: DC

## 2019-06-22 MED ORDER — KETOROLAC TROMETHAMINE 30 MG/ML IJ SOLN
30.0000 mg | Freq: Once | INTRAMUSCULAR | Status: AC
  Administered 2019-06-22: 30 mg via INTRAMUSCULAR
  Filled 2019-06-22: qty 1

## 2019-06-22 NOTE — ED Provider Notes (Signed)
[facility] EMERGENCY DEPARTMENT Provider Note   CSN: [id number] Arrival date & time: 06/22/19  1140     History   Chief Complaint Chief Complaint  Patient presents with  . Neck Pain    HPI Debbie Santana is a 28 y.o. female with history of chronic back pain s/p surgery presents to the ER for evaluation of left-sided neck pain.  This began this morning when she woke up.  Described as a catching sensation to the posterior left neck that radiates upwards into the base of her skull.  She has no pain if she is not moving her head but if she starts to move her neck she gets this catching, sharp pain in her neck.  She tried to muscle relaxers at home and heat and pain did not improve.  She denies any falls, recent head or neck trauma.  She denies any previous injuries or surgeries to the neck, recent injections.  No fever.  No associated headache.  No sore throat.  Denies distal tingling numbness or weakness to the extremity.  Aggravated with movement.  Alleviated by rest and keeping head and neck still.       HPI  Past Medical History:  Diagnosis Date  . Anxiety   . Bradycardia 04/17/2017  . Chronic back pain   . 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 Gastric ulcer   . Genital warts   . History of kidney stones   . Mental disorder   . Peptic ulcer disease 04/13/2017  . Sciatica   . Vaginal Pap smear, abnormal     Patient Active Problem List   Diagnosis Date Noted  . Migraine without aura and without status migrainosus, not intractable 02/19/2019  . Anxiety and depression 02/19/2019  . Nexplanon in place 02/19/2019  . Irregular intermenstrual bleeding 02/19/2019  . Nausea & vomiting 12/07/2018  . Low back pain 12/07/2018  . Gastroenteritis 12/07/2018  . Acute lower UTI 12/07/2018  . Dehydration 12/07/2018  . 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 History of gestational hypertension 05/21/2018  . Condyloma 04/15/2018  . History of abnormal cervical Pap smear 11/04/2017  . Cigarette  smoker 04/26/2017  . Reflux esophagitis 04/17/2017  . Obesity 04/17/2017  . Tobacco abuse 04/17/2017  . Peptic ulcer disease 04/13/2017  . 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 Intractable abdominal pain 04/11/2017  . Leukocytosis 04/11/2017  . Hyperglycemia 04/11/2017  . Anxiety 04/11/2017    Past Surgical History:  Procedure Laterality Date  . BACK SURGERY     ruptured disc.  . BIOPSY  04/14/2017   Procedure: BIOPSY;  Surgeon: Rogene Houston, MD;  Location: [facility];  Service: Endoscopy;;  gastric  . CERVICAL ABLATION N/A 12/02/2018   Procedure: LASER ABLATION OF CERVIX;  Surgeon: Florian Buff, MD;  Location: [facility] ORS;  Service: Gynecology;  Laterality: N/A;  . CHOLECYSTECTOMY N/A 04/17/2017   Procedure: LAPAROSCOPIC CHOLECYSTECTOMY;  Surgeon: Aviva Signs, MD;  Location: [facility] ORS;  Service: General;  Laterality: N/A;  . COLPOSCOPY    . ESOPHAGOGASTRODUODENOSCOPY (EGD) WITH PROPOFOL N/A 04/14/2017   Procedure: ESOPHAGOGASTRODUODENOSCOPY (EGD) WITH PROPOFOL;  Surgeon: Rogene Houston, MD;  Location: [facility];  Service: Endoscopy;  Laterality: N/A;  . HERNIA REPAIR Bilateral    inguinal  . LASER ABLATION CONDOLAMATA N/A 12/02/2018   Procedure: LASER ABLATION CONDYLOMA ACCUMINATA LEFT AND RIGHT VULVA, PERINEUM AND PERIANAL (15 TOTAL);  Surgeon: Florian Buff, MD;  Location: [facility] ORS;  Service: Gynecology;  Laterality: N/A;     OB  History    Gravida  2   Para  1   Term  1   Preterm      AB  1   Living  1     SAB  1   TAB      Ectopic      Multiple  0   Live Births  1            Home Medications    Prior to Admission medications   Medication Sig Start Date End Date Taking? Authorizing Provider  Aspirin-Acetaminophen-Caffeine (EXCEDRIN MIGRAINE PO) Take by mouth as needed.    [provider]  busPIRone (BUSPAR) 7.5 MG tablet Take 1 tablet (7.5 mg total) by mouth 3 (three) times daily. 03/12/19   Estill Dooms, NP  cyclobenzaprine (FLEXERIL)  10 MG tablet Take 1 tablet (10 mg total) by mouth 2 (two) times daily as needed for muscle spasms. 04/09/19   Estill Dooms, NP  diclofenac sodium (VOLTAREN) 1 % GEL Apply 4 g topically 4 (four) times daily. 06/22/19   Kinnie Feil, PA-C  etonogestrel (NEXPLANON) 68 MG IMPL implant 1 each by Subdermal route once.    [provider]  Omeprazole Magnesium (PRILOSEC OTC PO) Take by mouth daily.    [provider]  PARoxetine (PAXIL) 20 MG tablet Take 1 tablet (20 mg total) by mouth daily. 04/09/19   Estill Dooms, NP  promethazine (PHENERGAN) 25 MG tablet Take 1 tablet (25 mg total) by mouth every 6 (six) hours as needed for nausea or vomiting. Patient not taking: Reported on 05/17/2019 02/19/19   Derrek Monaco A, NP  rizatriptan (MAXALT) 10 MG tablet Take 1 tablet (10 mg total) by mouth as needed for migraine. May repeat in 2 hours if needed Patient not taking: Reported on 05/17/2019 03/12/19   Estill Dooms, NP    Family History Family History  Problem Relation Age of Onset  . Anxiety disorder Mother   . Hyperlipidemia Mother   . Diabetes Maternal Grandfather   . Diabetes Cousin   . Learning disabilities Cousin     Social History Social History   Tobacco Use  . Smoking status: Current Every Day Smoker    Packs/day: 0.50    Years: 5.00    Pack years: 2.50    Types: Cigarettes  . Smokeless tobacco: Never Used  Substance Use Topics  . Alcohol use: No  . Drug use: No     Allergies   Patient has no known allergies.   Review of Systems Review of Systems  Musculoskeletal: Positive for myalgias and neck pain.  All other systems reviewed and are negative.    Physical Exam Updated Vital Signs BP (!) 144/100 (BP Location: Right Arm)   Pulse 95   Temp 98.2 F (36.8 C) (Oral)   Resp 12   Ht 5\' 8"  (1.727 m)   Wt 104.3 kg   SpO2 98%   BMI 34.97 kg/m   Physical Exam Constitutional:      General: She is not in acute distress.     Appearance: She is well-developed.  HENT:     Head: Normocephalic and atraumatic.     Right Ear: External ear normal.     Left Ear: External ear normal.     Nose: Nose normal.  Eyes:     General: Lids are normal.  Neck:     Musculoskeletal: Neck supple.     Thyroid: No thyromegaly.     Trachea:  Trachea and phonation normal.     Comments: No reproducible muscular tenderness. Pain with lateral bend R/L, flexion/extension however ROM full despite pain.  Negative Spurling's. Negative Adson's. No rigidity or meningeal signs. No midline cervical spinous process or step offs. Trachea midline. Thyroid non palpable.  Cardiovascular:     Rate and Rhythm: Normal rate and regular rhythm.     Comments: 2+ radial pulses bilaterally. Good cap refill to fingers bilaterally.  Pulmonary:     Effort: Pulmonary effort is normal.     Breath sounds: Normal breath sounds.  Musculoskeletal: Normal range of motion.     Cervical back: She exhibits tenderness.  Lymphadenopathy:     Cervical: No cervical adenopathy.  Skin:    General: Skin is warm and dry.     Capillary Refill: Capillary refill takes less than 2 seconds.     Comments: No signs of trauma or rash to neck or upper extremities   Neurological:     Mental Status: She is alert and oriented to person, place, and time.     Comments: Sensation to light touch and strength intact in upper extremities=  Psychiatric:        Behavior: Behavior normal.        Thought Content: Thought content normal.      ED Treatments / Results  Labs (all labs ordered are listed, but only abnormal results are displayed) Labs Reviewed - No data to display  EKG None  Radiology No results found.  Procedures Procedures (including critical care time)  Medications Ordered in ED Medications  acetaminophen (TYLENOL) tablet 1,000 mg (has no administration in time range)  ketorolac (TORADOL) 30 MG/ML injection 30 mg (has no administration in time range)  diazepam  (VALIUM) tablet 2 mg (has no administration in time range)  lidocaine (LIDODERM) 5 % 1 patch (has no administration in time range)     Initial Impression / Assessment and Plan / ED Course  I have reviewed the triage vital signs and the nursing notes.  Pertinent labs & imaging results that were available during my care of the patient were reviewed by me and considered in my medical decision making (see chart for details).  28 year old is here with left-sided intermittent neck pain.  No recent trauma.  No antecedent event.  She woke up when she noticed the symptoms.  Exam is very reassuring.  I am not able to produce any tenderness with palpation but patient has pain with range of motion of the neck.  No frank meningismus.  Negative Adson's and Spurling's.  I have high suspicion for soft tissue injury such as spasm.  She has no radicular symptoms.  No known previous neck injuries, bulging disc.  No recent injections.  She has no other concerning features associated with the neck pain such as sensory loss, weakness, fevers, IV drug use.  No trauma.  No midline tenderness.  I considered dissection, meningitis, epidural abscess or fracture highly unlikely based on the history and exam.  I don't think emergent imaging is indicated at this time as patient has no red flag symptoms or signs.  Given first time symptoms, reassuring physical exam findings will treat for soft tissue injury/spasm with NSAID, muscle relaxer, lidocaine patch, volteren gel, massage, heat.  She was given medicines here.  Advised patient to avoid aggravating activities until symptoms start to improve.  Educated patient on red flag symptoms that would warrant return to ED, patient verbalized understanding.  Patient advised to f/u with PCP pr  her neurosurgeon for persistent symptoms.  Final Clinical Impressions(s) / ED Diagnoses   Final diagnoses:  Muscle spasms of neck    ED Discharge Orders         Ordered    diclofenac sodium  (VOLTAREN) 1 % GEL  4 times daily     06/22/19 1239           Kinnie Feil, PA-C 06/22/19 1247    Hayden Rasmussen, MD 06/22/19 1640

## 2019-06-22 NOTE — ED Triage Notes (Signed)
Pt is having bad pain in the left side of her neck. Woke up with pain. 2 days ago pain started and went away. Woke up this morning and it was worse. Took a muscle relaxer

## 2019-06-22 NOTE — Discharge Instructions (Addendum)
You were seen in the ER for left-sided neck pain.  I suspect your symptoms are from a soft tissue injury possibly muscle spasms.  Initial treatment will include anti-inflammatories, muscle relaxers, heat, massage and stretching.  For pain and inflammation you can use a combination of ibuprofen and acetaminophen.  Take 2100022998 mg acetaminophen (tylenol) every 6 hours or 600 mg ibuprofen (advil, motrin) every 6 hours.  You can take these separately or combine them every 6 hours for maximum pain control. Do not exceed 4,000 mg acetaminophen or 2,400 mg ibuprofen in a 24 hour period.  Do not take ibuprofen containing products if you have history of kidney disease, ulcers, GI bleeding, severe acid reflux, or take a blood thinner.  Do not take acetaminophen if you have liver disease.   Take your muscle relaxer every 6-8 hours.  Apply heat to the area.  You can also massage with Voltaren gel and stretch and heat afterwards to help with spasm and tightness.  You can apply over the counter lidocaine patches to the neck throughout the day to help with pain.   Return to the ER for worsening pain, severe associated headache, neurological symptoms like vision changes, difficulty with speech or walking, tingling numbness or weakness or extremities, fevers

## 2019-07-01 ENCOUNTER — Other Ambulatory Visit: Payer: Self-pay

## 2019-07-01 DIAGNOSIS — Z20822 Contact with and (suspected) exposure to covid-19: Secondary | ICD-10-CM

## 2019-07-01 DIAGNOSIS — Z20828 Contact with and (suspected) exposure to other viral communicable diseases: Secondary | ICD-10-CM | POA: Diagnosis not present

## 2019-07-03 LAB — NOVEL CORONAVIRUS, NAA: SARS-CoV-2, NAA: NOT DETECTED

## 2019-07-12 ENCOUNTER — Ambulatory Visit (INDEPENDENT_AMBULATORY_CARE_PROVIDER_SITE_OTHER): Payer: Medicaid Other | Admitting: Adult Health

## 2019-07-12 ENCOUNTER — Other Ambulatory Visit: Payer: Self-pay

## 2019-07-12 ENCOUNTER — Encounter: Payer: Self-pay | Admitting: Adult Health

## 2019-07-12 VITALS — BP 120/87 | HR 87 | Ht 68.0 in | Wt 248.0 lb

## 2019-07-12 DIAGNOSIS — F329 Major depressive disorder, single episode, unspecified: Secondary | ICD-10-CM

## 2019-07-12 DIAGNOSIS — F419 Anxiety disorder, unspecified: Secondary | ICD-10-CM | POA: Diagnosis not present

## 2019-07-12 DIAGNOSIS — F32A Depression, unspecified: Secondary | ICD-10-CM

## 2019-07-12 MED ORDER — PAROXETINE HCL 30 MG PO TABS: 30.0000 mg | ORAL_TABLET | Freq: Every day | ORAL | 6 refills | Status: DC

## 2019-07-12 MED ORDER — BUSPIRONE HCL 7.5 MG PO TABS: 15.0000 mg | ORAL_TABLET | Freq: Two times a day (BID) | ORAL | 6 refills | Status: DC

## 2019-07-12 NOTE — Progress Notes (Signed)
  Subjective:     Patient ID: Debbie Santana, female   DOB: [birth year], 28 y.o.   MRN: [id number]  HPI Debbie Santana is a 28 year old white female, single, G1P1011, back in follow up on Buspar and Paxil, and she misses Buspar at times and thinks may need increase in dose. PCP is [facility].   Review of Systems Feels better as far as depression, some anxiety at times   Reviewed past medical,surgical, social and family history. Reviewed medications and allergies.  Objective:   Physical Exam BP 120/87 (BP Location: Left Arm, Patient Position: Sitting, Cuff Size: Normal)   Pulse 87   Ht 5\' 8"  (1.727 m)   Wt 248 lb (112.5 kg)   LMP 07/02/2019   BMI 37.71 kg/m   Skin warm and dry.  Lungs: clear to ausculation bilaterally. Cardiovascular: regular rate and rhythm. Fall risk is low PHQ 2 score 0. Try to decrease smoking.    Assessment:     1. Anxiety and depression       Plan:     Will increase paxil to 30 mg and change buspar Meds ordered this encounter  Medications  . busPIRone (BUSPAR) 7.5 MG tablet    Sig: Take 2 tablets (15 mg total) by mouth 2 (two) times daily.    Dispense:  60 tablet    Refill:  6    Order Specific Question:   Supervising Provider    Answer:   Elonda Husky, LUTHER H [[id number]]  . PARoxetine (PAXIL) 30 MG tablet    Sig: Take 1 tablet (30 mg total) by mouth daily.    Dispense:  30 tablet    Refill:  6    Order Specific Question:   Supervising Provider    Answer:   Florian Buff [[id number]]  Follow up  In 3 months Has pap and physical November 5th with Manus Gunning.

## 2019-07-29 ENCOUNTER — Encounter: Payer: Self-pay | Admitting: Advanced Practice Midwife

## 2019-07-29 ENCOUNTER — Other Ambulatory Visit (HOSPITAL_COMMUNITY)
Admission: RE | Admit: 2019-07-29 | Discharge: 2019-07-29 | Disposition: A | Discharge status: Home / Self Care | Payer: Medicaid Other | Source: Ambulatory Visit | Attending: Advanced Practice Midwife | Admitting: Advanced Practice Midwife

## 2019-07-29 ENCOUNTER — Other Ambulatory Visit: Payer: Self-pay

## 2019-07-29 ENCOUNTER — Ambulatory Visit (INDEPENDENT_AMBULATORY_CARE_PROVIDER_SITE_OTHER): Payer: Medicaid Other | Admitting: Advanced Practice Midwife

## 2019-07-29 VITALS — BP 124/85 | HR 75 | Ht 68.0 in | Wt 248.0 lb

## 2019-07-29 DIAGNOSIS — Z01419 Encounter for gynecological examination (general) (routine) without abnormal findings: Secondary | ICD-10-CM | POA: Diagnosis not present

## 2019-07-29 DIAGNOSIS — Z0000 Encounter for general adult medical examination without abnormal findings: Secondary | ICD-10-CM | POA: Insufficient documentation

## 2019-07-29 NOTE — Progress Notes (Addendum)
GYNECOLOGY ANNUAL PREVENTATIVE CARE ENCOUNTER NOTE  History:     Debbie Santana is a 28 y.o. G90P1011 female here for a routine annual gynecologic exam with Pap. Current complaints: none.   Denies abnormal vaginal bleeding, discharge, pelvic pain, problems with intercourse or other gynecologic concerns.    Gynecologic History Patient's last menstrual period was 07/22/2019 (approximate). Contraception: Nexplanon Last Pap: 09/07/2018. 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    Obstetric History OB History  Gravida Para Term Preterm AB Living  2 1 1   1 1   SAB TAB Ectopic Multiple Live Births  1     0 1    # Outcome Date GA Lbr Len/2nd Weight Sex Delivery Anes PTL Lv  2 Term 05/15/18 [redacted]w[redacted]d / 00:50 3110 g M Vag-Spont EPI  LIV  1 SAB 2014            Past Medical History:  Diagnosis Date  . Anxiety   . Bradycardia 04/17/2017  . Chronic back pain   . 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 Gastric ulcer   . Genital warts   . History of kidney stones   . Mental disorder   . Peptic ulcer disease 04/13/2017  . Sciatica   . Vaginal Pap smear, abnormal     Past Surgical History:  Procedure Laterality Date  . BACK SURGERY     ruptured disc.  . BIOPSY  04/14/2017   Procedure: BIOPSY;  Surgeon: Rogene Houston, MD;  Location: [facility];  Service: Endoscopy;;  gastric  . CERVICAL ABLATION N/A 12/02/2018   Procedure: LASER ABLATION OF CERVIX;  Surgeon: Florian Buff, MD;  Location: [facility];  Service: Gynecology;  Laterality: N/A;  . CHOLECYSTECTOMY N/A 04/17/2017   Procedure: LAPAROSCOPIC CHOLECYSTECTOMY;  Surgeon: Aviva Signs, MD;  Location: [facility] ORS;  Service: General;  Laterality: N/A;  . COLPOSCOPY    . ESOPHAGOGASTRODUODENOSCOPY (EGD) WITH PROPOFOL N/A 04/14/2017   Procedure: ESOPHAGOGASTRODUODENOSCOPY (EGD) WITH PROPOFOL;  Surgeon: Rogene Houston, MD;  Location: [facility];  Service: Endoscopy;  Laterality: N/A;   . HERNIA REPAIR Bilateral    inguinal  . LASER ABLATION CONDOLAMATA N/A 12/02/2018   Procedure: LASER ABLATION CONDYLOMA ACCUMINATA LEFT AND RIGHT VULVA, PERINEUM AND PERIANAL (15 TOTAL);  Surgeon: Florian Buff, MD;  Location: [facility];  Service: Gynecology;  Laterality: N/A;    Current Outpatient Medications on File Prior to Visit  Medication Sig Dispense Refill  . Aspirin-Acetaminophen-Caffeine (EXCEDRIN MIGRAINE PO) Take by mouth as needed.    . busPIRone (BUSPAR) 7.5 MG tablet Take 2 tablets (15 mg total) by mouth 2 (two) times daily. 60 tablet 6  . cyclobenzaprine (FLEXERIL) 10 MG tablet Take 1 tablet (10 mg total) by mouth 2 (two) times daily as needed for muscle spasms. 30 tablet 1  . etonogestrel (NEXPLANON) 68 MG IMPL implant 1 each by Subdermal route once.    Marland Kitchen PARoxetine (PAXIL) 30 MG tablet Take 1 tablet (30 mg total) by mouth daily. 30 tablet 6  . promethazine (PHENERGAN) 25 MG tablet Take 1 tablet (25 mg total) by mouth every 6 (six) hours as needed for nausea or vomiting. 30 tablet 1  . rizatriptan (MAXALT) 10 MG tablet Take 1 tablet (10 mg total) by mouth as needed for migraine. May repeat in 2 hours if needed 10 tablet 3   No current facility-administered medications on file prior to visit.  No Known Allergies  Social History:  reports that she has been smoking cigarettes. She has a 2.50 pack-year smoking history. She has never used smokeless tobacco. She reports that she does not drink alcohol or use drugs.  Family History  Problem Relation Age of Onset  . Anxiety disorder Mother   . Hyperlipidemia Mother   . Diabetes Maternal Grandfather   . Diabetes Cousin   . Learning disabilities Cousin     The following portions of the patient's history were reviewed and updated as appropriate: allergies, current medications, past family history, past medical history, past social history, past surgical history and problem list.  Review of Systems Pertinent items noted in  HPI and remainder of comprehensive ROS otherwise negative.  Physical Exam:  BP 124/85 (BP Location: Left Arm, Patient Position: Sitting, Cuff Size: Large)   Pulse 75   Ht 5\' 8"  (1.727 m)   Wt 112.5 kg   LMP 07/22/2019 (Approximate)   Breastfeeding No   BMI 37.71 kg/m  CONSTITUTIONAL: Well-developed, well-nourished female in no acute distress.  HENT:  Normocephalic, atraumatic, External right and left ear normal. Oropharynx is clear and moist EYES: Conjunctivae and EOM are normal. Pupils are equal, round, and reactive to light. No scleral icterus.  NECK: Normal range of motion, supple, no masses.  Normal thyroid.  SKIN: Skin is warm and dry. No rash noted. Not diaphoretic. No erythema. No pallor. MUSCULOSKELETAL: Normal range of motion. No tenderness.  No cyanosis, clubbing, or edema.  2+ distal pulses. NEUROLOGIC: Alert and oriented to person, place, and time. Normal reflexes, muscle tone coordination. No cranial nerve deficit noted. PSYCHIATRIC: Normal mood and affect. Normal behavior. Normal judgment and thought content. CARDIOVASCULAR: Normal heart rate noted, regular rhythm RESPIRATORY: Clear to auscultation bilaterally. Effort and breath sounds normal, no problems with respiration noted. BREASTS: Symmetric in size. No masses, skin changes, nipple drainage, or lymphadenopathy. ABDOMEN: Soft, normal bowel sounds, no distention noted.  No tenderness, rebound or guarding.  PELVIC: Normal appearing external genitalia; normal appearing pink vaginal mucosa with rugae and smooth, pink, cervix. No abnormal discharge noted.  Pap smear obtained.  Normal uterine size, no other palpable masses, no uterine or adnexal tenderness. Exam limited by habitus   Assessment and Plan:    1. Routine medical exam - Routine well woman exam with Pap - Pt doing well - Follow up in 6 months for repeat pap with Dr. Elonda Husky  [facility]  Will follow up results of pap smear and manage  accordingly. Routine preventative health maintenance measures emphasized. Please refer to After Visit Summary for other counseling recommendations.      Maryagnes Amos, SNM  I personally saw and evaluated the patient, performing the key elements of the service. I developed and verified the management plan that is described in the resident's/student's note, and I agree with the content with my edits above. Nigel Berthold, CNM 07/29/2019 1:25 PM

## 2019-08-02 LAB — CYTOLOGY - PAP
Comment: NEGATIVE
Diagnosis: NEGATIVE
High risk HPV: NEGATIVE

## 2019-08-13 DIAGNOSIS — Z23 Encounter for immunization: Secondary | ICD-10-CM | POA: Diagnosis not present

## 2019-08-16 ENCOUNTER — Other Ambulatory Visit: Payer: Self-pay | Admitting: Adult Health

## 2019-08-17 ENCOUNTER — Encounter (HOSPITAL_COMMUNITY): Payer: Self-pay | Admitting: Emergency Medicine

## 2019-08-17 ENCOUNTER — Other Ambulatory Visit: Payer: Self-pay

## 2019-08-17 ENCOUNTER — Emergency Department (HOSPITAL_COMMUNITY)
Admission: EM | Admit: 2019-08-17 | Discharge: 2019-08-17 | Disposition: A | Discharge status: Home / Self Care | Payer: Medicaid Other | Attending: Emergency Medicine | Admitting: Emergency Medicine

## 2019-08-17 DIAGNOSIS — Z5321 Procedure and treatment not carried out due to patient leaving prior to being seen by health care provider: Secondary | ICD-10-CM | POA: Diagnosis not present

## 2019-08-17 DIAGNOSIS — M545 Low back pain: Secondary | ICD-10-CM | POA: Diagnosis present

## 2019-08-17 NOTE — ED Triage Notes (Signed)
PT states she was bending over cleaning and starting having lower back pain today. Pt denies any urinary symptoms and states had some lower back pain last week but it had gotten better until today.

## 2019-09-01 ENCOUNTER — Encounter: Payer: Self-pay | Admitting: Advanced Practice Midwife

## 2019-09-01 ENCOUNTER — Other Ambulatory Visit: Payer: Self-pay

## 2019-09-01 ENCOUNTER — Ambulatory Visit (INDEPENDENT_AMBULATORY_CARE_PROVIDER_SITE_OTHER): Payer: Medicaid Other | Admitting: Advanced Practice Midwife

## 2019-09-01 DIAGNOSIS — B372 Candidiasis of skin and nail: Secondary | ICD-10-CM | POA: Diagnosis not present

## 2019-09-01 MED ORDER — FLUCONAZOLE 100 MG PO TABS: 100.0000 mg | ORAL_TABLET | Freq: Every day | ORAL | 0 refills | Status: AC

## 2019-09-01 NOTE — Progress Notes (Signed)
   GYN VISIT Patient name: Debbie Santana MRN [id number]  Date of birth: [birth year] Chief Complaint:   rash in vaginal area (has an odor )  History of Present Illness:   Debbie Santana is a 28 y.o. G66P1011 Caucasian female being seen today for rash in bilat groin, starting earlier this week. Very tender with an odor. Hasn't been wearing underwear due to pain.     No LMP recorded. Patient has had an implant. The current method of family planning is Nexplanon.  Last pap 07/29/19. Results were:  normal Review of Systems:   Pertinent items are noted in HPI Denies fever/chills, dizziness, headaches, visual disturbances, fatigue, shortness of breath, chest pain, abdominal pain, vomiting, abnormal vaginal discharge/itching/odor/irritation, problems with periods, bowel movements, urination, or intercourse unless otherwise stated above.  Pertinent History Reviewed:  Reviewed past medical,surgical, social, obstetrical and family history.  Reviewed problem list, medications and allergies. Physical Assessment:   Vitals:   09/01/19 1155  BP: (!) 130/93  Pulse: 95  Weight: 243 lb (110.2 kg)  Height: 5\' 8"  (1.727 m)  Body mass index is 36.95 kg/m.       Physical Examination:   General appearance: alert, well appearing, and in no distress  Mental status: alert, oriented to person, place, and time  Skin: warm & dry   Cardiovascular: normal heart rate noted  Respiratory: normal respiratory effort, no distress  Abdomen: soft, non-tender   Pelvic: VULVA: pink/red-appearing vulva with no masses, tenderness or lesions; bilat groin R>L excoriated erythematous areas, very tender to touch  Extremities: no edema   Chaperone: Derrek Monaco    No results found for this or any previous visit (from the past 24 hour(s)).  Assessment & Plan:  1) Skin yeast in bilat groin> painted with gential violet and rx Diflucan 100mg  1 qd x 10d   Meds:  Meds ordered this encounter  Medications  . fluconazole  (DIFLUCAN) 100 MG tablet    Sig: Take 1 tablet (100 mg total) by mouth daily for 10 days.    Dispense:  10 tablet    Refill:  0    No orders of the defined types were placed in this encounter.   Return in about 1 week (around 09/08/2019) for 1-1.5wks with Anderson Malta.  Myrtis Ser CNM 09/01/2019 12:19 PM

## 2019-09-01 NOTE — Patient Instructions (Signed)
Skin Yeast Infection  A skin yeast infection is a condition in which there is an overgrowth of yeast (candida) that normally lives on the skin. This condition usually occurs in areas of the skin that are constantly warm and moist, such as the armpits or the groin. What are the causes? This condition is caused by a change in the normal balance of the yeast and bacteria that live on the skin. What increases the risk? You are more likely to develop this condition if you:  Are obese.  Are pregnant.  Take birth control pills.  Have diabetes.  Take antibiotic medicines.  Take steroid medicines.  Are malnourished.  Have a weak body defense system (immune system).  Are 52 years of age or older.  Wear tight clothing. What are the signs or symptoms? The most common symptom of this condition is itchiness in the affected area. Other symptoms include:  Red, swollen area of the skin.  Bumps on the skin. How is this diagnosed?  This condition is diagnosed with a medical history and physical exam.  Your health care provider may check for yeast by taking light scrapings of the skin to be viewed under a microscope. How is this treated? This condition is treated with medicine. Medicines may be prescribed or be available over the counter. The medicines may be:  Taken by mouth (orally).  Applied as a cream or powder to your skin. Follow these instructions at home:   Take or apply over-the-counter and prescription medicines only as told by your health care provider.  Maintain a healthy weight. If you need help losing weight, talk with your health care provider.  Keep your skin clean and dry.  If you have diabetes, keep your blood sugar under control.  Keep all follow-up visits as told by your health care provider. This is important. Contact a health care provider if:  Your symptoms go away and then return.  Your symptoms do not get better with treatment.  Your symptoms get  worse.  Your rash spreads.  You have a fever or chills.  You have new symptoms.  You have new warmth or redness of your skin. Summary  A skin yeast infection is a condition in which there is an overgrowth of yeast (candida) that normally lives on the skin. This condition is caused by a change in the normal balance of the yeast and bacteria that live on the skin.  Take or apply over-the-counter and prescription medicines only as told by your health care provider.  Keep your skin clean and dry.  Contact a health care provider if your symptoms do not get better with treatment. This information is not intended to replace advice given to you by your health care provider. Make sure you discuss any questions you have with your health care provider. Document Released: 05/28/2011 Document Revised: 01/27/2018 Document Reviewed: 01/27/2018 Elsevier Patient Education  2020 [employer].

## 2019-09-08 ENCOUNTER — Other Ambulatory Visit: Payer: Self-pay

## 2019-09-08 ENCOUNTER — Encounter: Payer: Self-pay | Admitting: Adult Health

## 2019-09-08 ENCOUNTER — Ambulatory Visit (INDEPENDENT_AMBULATORY_CARE_PROVIDER_SITE_OTHER): Payer: Medicaid Other | Admitting: Adult Health

## 2019-09-08 VITALS — BP 136/86 | HR 95 | Ht 68.0 in | Wt 246.2 lb

## 2019-09-08 DIAGNOSIS — Z131 Encounter for screening for diabetes mellitus: Secondary | ICD-10-CM

## 2019-09-08 DIAGNOSIS — B372 Candidiasis of skin and nail: Secondary | ICD-10-CM

## 2019-09-08 NOTE — Progress Notes (Signed)
  Subjective:     Patient ID: Debbie Santana, female   DOB: 19-May-1991, 28 y.o.   MRN: [id number]  HPI Harli is a 28 year old white female, single, G2P1011, back in follow up on being treated for skin fungus and is better on the right but left is irritated now.  PCP is [facility].  Review of Systems Right groin  is much better and now left groin is red and irritated Reviewed past medical,surgical, social and family history. Reviewed medications and allergies.     Objective:   Physical Exam BP 136/86 (BP Location: Left Arm, Patient Position: Sitting, Cuff Size: Large)   Pulse 95   Ht 5\' 8"  (1.727 m)   Wt 246 lb 3.2 oz (111.7 kg)   BMI 37.43 kg/m    Skin warm and dry.Pelvic: external genitalia is normal in appearance, has fading pink skin in right groin area, no excoriation or tenderness, on left there is redness and one area of excoriation, no lymph nodes felt.Painted with gentian violet. Examination chaperoned by Rolena Infante LPN.  Assessment:     1. Yeast infection of the skin Painted with gentian violet Finish taking diflucan Keep dry   2. Screening for diabetes mellitus Check CMP and A1c    Plan:     Follow up with me in 6 days

## 2019-09-09 LAB — COMPREHENSIVE METABOLIC PANEL
ALT: 17 IU/L (ref 0–32)
AST: 15 IU/L (ref 0–40)
Albumin/Globulin Ratio: 2 (ref 1.2–2.2)
Albumin: 4.1 g/dL (ref 3.9–5.0)
Alkaline Phosphatase: 90 IU/L (ref 39–117)
BUN/Creatinine Ratio: 10 (ref 9–23)
BUN: 7 mg/dL (ref 6–20)
Bilirubin Total: <0.2 mg/dL (ref 0.0–1.2)
CO2: 25 mmol/L (ref 20–29)
Calcium: 9.8 mg/dL (ref 8.7–10.2)
Chloride: 104 mmol/L (ref 96–106)
Creatinine, Ser: 0.72 mg/dL (ref 0.57–1.00)
GFR calc Af Amer: 131 mL/min/{1.73_m2} (ref >59)
GFR calc non Af Amer: 114 mL/min/{1.73_m2} (ref >59)
Globulin, Total: 2.1 g/dL (ref 1.5–4.5)
Glucose: 71 mg/dL (ref 65–99)
Potassium: 4.4 mmol/L (ref 3.5–5.2)
Sodium: 141 mmol/L (ref 134–144)
Total Protein: 6.2 g/dL (ref 6.0–8.5)

## 2019-09-09 LAB — HEMOGLOBIN A1C
Est. average glucose Bld gHb Est-mCnc: 108 mg/dL
Hgb A1c MFr Bld: 5.4 % (ref 4.8–5.6)

## 2019-09-24 DIAGNOSIS — G589 Mononeuropathy, unspecified: Secondary | ICD-10-CM

## 2019-09-24 HISTORY — DX: Mononeuropathy, unspecified: G58.9

## 2019-10-12 ENCOUNTER — Ambulatory Visit: Payer: Medicaid Other | Admitting: Adult Health

## 2019-10-13 ENCOUNTER — Other Ambulatory Visit: Payer: Self-pay | Admitting: Adult Health

## 2019-10-18 ENCOUNTER — Ambulatory Visit (INDEPENDENT_AMBULATORY_CARE_PROVIDER_SITE_OTHER): Payer: Medicaid Other | Admitting: Adult Health

## 2019-10-18 ENCOUNTER — Other Ambulatory Visit: Payer: Self-pay

## 2019-10-18 ENCOUNTER — Encounter: Payer: Self-pay | Admitting: Adult Health

## 2019-10-18 VITALS — BP 139/87 | HR 113 | Ht 68.0 in | Wt 250.2 lb

## 2019-10-18 DIAGNOSIS — F329 Major depressive disorder, single episode, unspecified: Secondary | ICD-10-CM | POA: Diagnosis not present

## 2019-10-18 DIAGNOSIS — B372 Candidiasis of skin and nail: Secondary | ICD-10-CM | POA: Diagnosis not present

## 2019-10-18 DIAGNOSIS — F419 Anxiety disorder, unspecified: Secondary | ICD-10-CM

## 2019-10-18 MED ORDER — PAROXETINE HCL 40 MG PO TABS: 40.0000 mg | ORAL_TABLET | ORAL | 6 refills | Status: DC

## 2019-10-18 NOTE — Progress Notes (Signed)
  Subjective:     Patient ID: Debbie Santana, female   DOB: 09/16/1991, 29 y.o.   MRN: [id number]  HPI [name] is a 29 year old white female, single, G2P1011, back in follow up and yeast has resolved. She feels down and thinks Paxil needs increasing, not working as well. PCP is [facility].   Review of Systems Skin infection has resolved  Feels down, thinks Paxil needs increasing  Reviewed past medical,surgical, social and family history. Reviewed medications and allergies.     Objective:   Physical Exam Skin warm and dry.Pelvic: external genitalia is normal in appearance no lesions,skin fungus has resolved, vagina:+period blood,urethra has no lesions or masses noted, cervix:smooth and bulbous, uterus: normal size, shape and contour, non tender, no masses felt, adnexa: no masses or tenderness noted. Bladder is non tender and no masses felt.  Examination chaperoned by Levy Pupa LPN PHQ 9 score is 10, denies being suicidal and is on Paxil, and Buspar and thinks Paxil needs increasing     Assessment:     1. Anxiety and depression Continue buspar has refills,  Will increase paxil to 40 mg daily Meds ordered this encounter  Medications  . PARoxetine (PAXIL) 40 MG tablet    Sig: Take 1 tablet (40 mg total) by mouth every morning.    Dispense:  30 tablet    Refill:  6    Order Specific Question:   Supervising Provider    Answer:   Elonda Husky, LUTHER H [[id number]]   2. Yeast infection of the skin Skin fungus has resolved     Plan:    Follow up in 6 weeks on increasing the Paxil

## 2019-11-15 ENCOUNTER — Telehealth: Payer: Self-pay | Admitting: *Deleted

## 2019-11-15 MED ORDER — FLUCONAZOLE 150 MG PO TABS: ORAL_TABLET | ORAL | 1 refills | Status: DC

## 2019-11-15 NOTE — Telephone Encounter (Addendum)
Pt states she has a rash in groin. Not itching but painful and burns. Pt states you advised to call when she had the rash and you would send something to her pharmacy. Thanks!! [place]

## 2019-11-15 NOTE — Addendum Note (Signed)
Addended by: Derrek Monaco A on: 11/15/2019 12:56 PM   Modules accepted: Orders

## 2019-11-15 NOTE — Telephone Encounter (Signed)
Will rx diflucan  

## 2019-11-15 NOTE — Telephone Encounter (Signed)
Line busy @ 11:46 am. [facility]

## 2019-11-15 NOTE — Telephone Encounter (Signed)
Patient left message that the rash she had previously is now back and worse.  Please call.

## 2019-11-16 ENCOUNTER — Ambulatory Visit (INDEPENDENT_AMBULATORY_CARE_PROVIDER_SITE_OTHER): Payer: Medicaid Other | Admitting: Adult Health

## 2019-11-16 ENCOUNTER — Encounter: Payer: Self-pay | Admitting: Adult Health

## 2019-11-16 ENCOUNTER — Other Ambulatory Visit: Payer: Self-pay

## 2019-11-16 VITALS — BP 131/88 | HR 110 | Ht 68.0 in | Wt 254.0 lb

## 2019-11-16 DIAGNOSIS — L304 Erythema intertrigo: Secondary | ICD-10-CM

## 2019-11-16 HISTORY — DX: Erythema intertrigo: L30.4

## 2019-11-16 MED ORDER — KETOCONAZOLE 2 % EX CREA: 1.0000 "application " | TOPICAL_CREAM | Freq: Two times a day (BID) | CUTANEOUS | 0 refills | Status: DC

## 2019-11-16 MED ORDER — IBUPROFEN 800 MG PO TABS: 800.0000 mg | ORAL_TABLET | Freq: Three times a day (TID) | ORAL | 3 refills | Status: DC | PRN

## 2019-11-16 NOTE — Progress Notes (Signed)
  Subjective:     Patient ID: Debbie Santana, female   DOB: [birth year], 29 y.o.   MRN: [id number]  HPI Debbie Santana is a 29 year old white female, single, G2P1011, in complaining of red painful rash in groin area.  [facility].  Review of Systems Has red painful rash in groin for about 3 days, R>L  Reviewed past medical,surgical, social and family history. Reviewed medications and allergies.     Objective:   Physical Exam BP 131/88 (BP Location: Left Arm, Patient Position: Sitting, Cuff Size: Large)   Pulse (!) 110   Ht 5\' 8"  (1.727 m)   Wt 254 lb (115.2 kg)   Breastfeeding No   BMI 38.62 kg/m  Skin warm and dry.Pelvic: external genitalia has red moist opposing surface in groin R>L, has odor,painted with gentian violet, vagina: scant discharge without odor,urethra has no lesions or masses noted, cervix:smooth and bulbous, uterus: normal size, shape and contour, non tender, no masses felt, adnexa: no masses or tenderness noted. Bladder is non tender and no masses felt.  Examination chaperoned by Terri Skains NP student.  She request pain meds, will rx motrin.     Assessment:     1. Intertrigo Painted with gentian violet Take diflucan,has rx Will rx ketoconazole cream Meds ordered this encounter  Medications  . ketoconazole (NIZORAL) 2 % cream    Sig: Apply 1 application topically 2 (two) times daily.    Dispense:  15 g    Refill:  0    Order Specific Question:   Supervising Provider    Answer:   EURE, LUTHER H [[id number]]  . ibuprofen (ADVIL) 800 MG tablet    Sig: Take 1 tablet (800 mg total) by mouth every 8 (eight) hours as needed.    Dispense:  30 tablet    Refill:  3    Order Specific Question:   Supervising Provider    Answer:   Florian Buff [[id number]]      Plan:     Keep clean and dry Review handout on intertrigo Follow up in 1 week  May Rx zeasorb or miconazole nitrate 2% powder at follow up

## 2019-11-16 NOTE — Patient Instructions (Signed)
Intertrigo Intertrigo is skin irritation (inflammation) that happens in warm, moist areas of the body. The irritation can cause a rash and make skin raw and itchy. The rash is usually pink or red. It happens mostly between folds of skin or where skin rubs together, such as:  Between the toes.  In the armpits.  In the groin area.  Under the belly.  Under the breasts.  Around the butt area. This condition is not passed from person to person (is not contagious). What are the causes?  Heat, moisture, rubbing, and not enough air movement.  The condition can be made worse by: ? Sweat. ? Bacteria. ? A fungus, such as yeast. What increases the risk?  Moisture in your skin folds.  You are more likely to develop this condition if you: ? Have diabetes. ? Are overweight. ? Are not able to move around. ? Live in a warm and moist climate. ? Wear splints, braces, or other medical devices. ? Are not able to control your pee (urine) or poop (stool). What are the signs or symptoms?  A pink or red skin rash in the skin fold or near the skin fold.  Raw or scaly skin.  Itching.  A burning feeling.  Bleeding.  Leaking fluid.  A bad smell. How is this treated?  Cleaning and drying your skin.  Taking an antibiotic medicine or using an antibiotic skin cream for a bacterial infection.  Using an antifungal cream on your skin or taking pills for an infection that was caused by a fungus, such as yeast.  Using a steroid ointment to stop the itching and irritation.  Separating the skin fold with a clean cotton cloth to absorb moisture and allow air to flow into the area. Follow these instructions at home:  Keep the affected area clean and dry.  Do not scratch your skin.  Stay cool as much as you can. Use an air conditioner or a fan, if you have one.  Apply over-the-counter and prescription medicines only as told by your doctor.  If you were prescribed an antibiotic medicine,  use it as told by your doctor. Do not stop using the antibiotic even if your condition starts to get better.  Keep all follow-up visits as told by your doctor. This is important. How is this prevented?   Stay at a healthy weight.  Take care of your feet. This is very important if you have diabetes. You should: ? Wear shoes that fit well. ? Keep your feet dry. ? Wear clean cotton or wool socks.  Protect the skin in your groin and butt area as told by your doctor. To do this: ? Follow a regular cleaning routine. ? Use creams, powders, or ointments that protect your skin. ? Change protection pads often.  Do not wear tight clothes. Wear clothes that: ? Are loose. ? Take moisture away from your body. ? Are made of cotton.  Wear a bra that gives good support, if needed.  Shower and dry yourself well after being active. Use a hair dryer on a cool setting to dry between skin folds.  Keep your blood sugar under control if you have diabetes. Contact a doctor if:  Your symptoms do not get better with treatment.  Your symptoms get worse or they spread.  You notice more redness and warmth.  You have a fever. Summary  Intertrigo is skin irritation that occurs when folds of skin rub together.  This condition is caused by heat, moisture,   and rubbing.  This condition may be treated by cleaning and drying your skin and with medicines.  Apply over-the-counter and prescription medicines only as told by your doctor.  Keep all follow-up visits as told by your doctor. This is important. This information is not intended to replace advice given to you by your health care provider. Make sure you discuss any questions you have with your health care provider. Document Revised: 06/18/2018 Document Reviewed: 06/18/2018 Elsevier Patient Education  2020 [employer]  

## 2019-11-29 ENCOUNTER — Ambulatory Visit (INDEPENDENT_AMBULATORY_CARE_PROVIDER_SITE_OTHER): Payer: Medicaid Other | Admitting: Adult Health

## 2019-11-29 ENCOUNTER — Other Ambulatory Visit: Payer: Self-pay

## 2019-11-29 ENCOUNTER — Encounter: Payer: Self-pay | Admitting: Adult Health

## 2019-11-29 VITALS — BP 132/86 | HR 95 | Ht 68.0 in | Wt 255.0 lb

## 2019-11-29 DIAGNOSIS — F419 Anxiety disorder, unspecified: Secondary | ICD-10-CM

## 2019-11-29 DIAGNOSIS — L304 Erythema intertrigo: Secondary | ICD-10-CM | POA: Diagnosis not present

## 2019-11-29 DIAGNOSIS — F329 Major depressive disorder, single episode, unspecified: Secondary | ICD-10-CM

## 2019-11-29 NOTE — Progress Notes (Signed)
  Subjective:     Patient ID: Debbie Santana, female   DOB: [birth year], 29 y.o.   MRN: [id number]  HPI Debbie Santana is a 29 year old white female,single,G2P1011, back in follow up on intertrigo and feels much better, no pain or redness. PCP is [facility].  Review of Systems Feels much better, no pain or redness in groin area Has slight tremor, had before paxil but she thinks is more often  Reviewed past medical,surgical, social and family history. Reviewed medications and allergies.     Objective:   Physical Exam BP 132/86 (BP Location: Left Arm, Patient Position: Sitting, Cuff Size: Normal)   Pulse 95   Ht 5\' 8"  (1.727 m)   Wt 255 lb (115.7 kg)   BMI 38.77 kg/m   Skin warm and dry and intertrigo in groin has resolved,no redness or swelling     Assessment:     1. Intertrigo,resolved Keep clean and dry  2. Anxiety and depression Continue paxil and buspar Discussed that tremor can be side effect of paxil, she does not want to stop will continue     Plan:     Follow up in 3 months or sooner if needed

## 2019-12-03 ENCOUNTER — Other Ambulatory Visit: Payer: Self-pay | Admitting: Adult Health

## 2020-01-18 ENCOUNTER — Other Ambulatory Visit: Payer: Self-pay

## 2020-01-18 ENCOUNTER — Emergency Department (HOSPITAL_COMMUNITY): Payer: Medicaid Other

## 2020-01-18 ENCOUNTER — Emergency Department (HOSPITAL_COMMUNITY)
Admission: EM | Admit: 2020-01-18 | Discharge: 2020-01-18 | Disposition: A | Discharge status: Home / Self Care | Payer: Medicaid Other | Attending: Emergency Medicine | Admitting: Emergency Medicine

## 2020-01-18 DIAGNOSIS — R1030 Lower abdominal pain, unspecified: Secondary | ICD-10-CM | POA: Diagnosis not present

## 2020-01-18 DIAGNOSIS — Z87442 Personal history of urinary calculi: Secondary | ICD-10-CM | POA: Diagnosis not present

## 2020-01-18 DIAGNOSIS — N3001 Acute cystitis with hematuria: Secondary | ICD-10-CM | POA: Diagnosis not present

## 2020-01-18 DIAGNOSIS — R109 Unspecified abdominal pain: Secondary | ICD-10-CM | POA: Diagnosis not present

## 2020-01-18 DIAGNOSIS — F17210 Nicotine dependence, cigarettes, uncomplicated: Secondary | ICD-10-CM | POA: Diagnosis not present

## 2020-01-18 LAB — URINALYSIS, ROUTINE W REFLEX MICROSCOPIC
Bilirubin Urine: NEGATIVE
Glucose, UA: NEGATIVE mg/dL
Ketones, ur: NEGATIVE mg/dL
Nitrite: NEGATIVE
Protein, ur: NEGATIVE mg/dL
Specific Gravity, Urine: 1.013 (ref 1.005–1.030)
pH: 8 (ref 5.0–8.0)

## 2020-01-18 LAB — CBC
HCT: 44.8 % (ref 36.0–46.0)
Hemoglobin: 14.7 g/dL (ref 12.0–15.0)
MCH: 31.2 pg (ref 26.0–34.0)
MCHC: 32.8 g/dL (ref 30.0–36.0)
MCV: 95.1 fL (ref 80.0–100.0)
Platelets: 255 10*3/uL (ref 150–400)
RBC: 4.71 MIL/uL (ref 3.87–5.11)
RDW: 12.1 % (ref 11.5–15.5)
WBC: 8.9 10*3/uL (ref 4.0–10.5)
nRBC: 0 % (ref 0.0–0.2)

## 2020-01-18 LAB — COMPREHENSIVE METABOLIC PANEL
ALT: 19 U/L (ref 0–44)
AST: 18 U/L (ref 15–41)
Albumin: 4 g/dL (ref 3.5–5.0)
Alkaline Phosphatase: 74 U/L (ref 38–126)
Anion gap: 6 (ref 5–15)
BUN: 8 mg/dL (ref 6–20)
CO2: 26 mmol/L (ref 22–32)
Calcium: 9.4 mg/dL (ref 8.9–10.3)
Chloride: 105 mmol/L (ref 98–111)
Creatinine, Ser: 0.88 mg/dL (ref 0.44–1.00)
GFR calc Af Amer: >60 mL/min (ref >60)
GFR calc non Af Amer: >60 mL/min (ref >60)
Glucose, Bld: 113 mg/dL — ABNORMAL HIGH (ref 70–99)
Potassium: 4 mmol/L (ref 3.5–5.1)
Sodium: 137 mmol/L (ref 135–145)
Total Bilirubin: 0.4 mg/dL (ref 0.3–1.2)
Total Protein: 7.1 g/dL (ref 6.5–8.1)

## 2020-01-18 LAB — LIPASE, BLOOD: Lipase: 24 U/L (ref 11–51)

## 2020-01-18 LAB — PREGNANCY, URINE: Preg Test, Ur: NEGATIVE

## 2020-01-18 MED ORDER — SODIUM CHLORIDE 0.9% FLUSH: 3.0000 mL | Freq: Once | INTRAVENOUS | Status: DC

## 2020-01-18 MED ORDER — CEPHALEXIN 500 MG PO CAPS
500.0000 mg | ORAL_CAPSULE | Freq: Four times a day (QID) | ORAL | 0 refills | Status: DC
Start: 2020-01-18 — End: 2020-01-27

## 2020-01-18 MED ORDER — ONDANSETRON 8 MG PO TBDP
8.0000 mg | ORAL_TABLET | Freq: Once | ORAL | Status: AC
  Administered 2020-01-18: 17:00:00 8 mg via ORAL
  Filled 2020-01-18: qty 1

## 2020-01-18 MED ORDER — KETOROLAC TROMETHAMINE 60 MG/2ML IM SOLN
60.0000 mg | Freq: Once | INTRAMUSCULAR | Status: AC
  Administered 2020-01-18: 60 mg via INTRAMUSCULAR
  Filled 2020-01-18: qty 2

## 2020-01-18 MED ORDER — PHENAZOPYRIDINE HCL 200 MG PO TABS
200.0000 mg | ORAL_TABLET | Freq: Three times a day (TID) | ORAL | 0 refills | Status: DC
Start: 2020-01-18 — End: 2020-01-27

## 2020-01-18 MED ORDER — OXYCODONE-ACETAMINOPHEN 5-325 MG PO TABS: 1.0000 | ORAL_TABLET | ORAL | 0 refills | Status: DC | PRN

## 2020-01-18 MED ORDER — OXYCODONE-ACETAMINOPHEN 5-325 MG PO TABS
1.0000 | ORAL_TABLET | Freq: Once | ORAL | Status: AC
  Administered 2020-01-18: 1 via ORAL
  Filled 2020-01-18: qty 1

## 2020-01-18 MED ORDER — CEPHALEXIN 500 MG PO CAPS
500.0000 mg | ORAL_CAPSULE | Freq: Once | ORAL | Status: AC
  Administered 2020-01-18: 500 mg via ORAL
  Filled 2020-01-18: qty 1

## 2020-01-18 NOTE — ED Provider Notes (Signed)
[facility] EMERGENCY DEPARTMENT Provider Note   CSN: [id number] Arrival date & time: 01/18/20  1141     History Chief Complaint  Patient presents with  . Abdominal Pain  . Back Pain    Debbie Santana is a 29 y.o. female.  HPI      Debbie Santana is a 29 y.o. female who presents to the Emergency Department complaining of mid lower back pain and lower abdominal pain.  Symptoms have been present for 2 days.  She also endorses increased urinary frequency and voiding small amounts.  She describes the pain in her lower back as nonradiating and dull.  Pain is constant.  Pain to her lower abdomen is sharp in quality.  She also endorses nausea and intermittent vomiting, but states she is able to tolerate fluids.  No fever or chills.  No cough, chest pain, or shortness of breath.  She states current symptoms feel like previous kidney stones.   Past Medical History:  Diagnosis Date  . Anxiety   . Bradycardia 04/17/2017  . Chronic back pain   . 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 Gastric ulcer   . Genital warts   . History of kidney stones   . Mental disorder   . Peptic ulcer disease 04/13/2017  . Sciatica   . Vaginal Pap smear, abnormal     Patient Active Problem List   Diagnosis Date Noted  . Intertrigo 11/16/2019  . Yeast infection of the skin 09/01/2019  . Migraine without aura and without status migrainosus, not intractable 02/19/2019  . Anxiety and depression 02/19/2019  . Nexplanon in place 02/19/2019  . Irregular intermenstrual bleeding 02/19/2019  . Nausea & vomiting 12/07/2018  . Low back pain 12/07/2018  . Gastroenteritis 12/07/2018  . Acute lower UTI 12/07/2018  . Dehydration 12/07/2018  . 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 History of gestational hypertension 05/21/2018  . Condyloma 04/15/2018  . History of abnormal cervical Pap smear 11/04/2017  . Cigarette smoker 04/26/2017  . Reflux esophagitis 04/17/2017  . Obesity 04/17/2017  . Tobacco abuse 04/17/2017    . Peptic ulcer disease 04/13/2017  . 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 Intractable abdominal pain 04/11/2017  . Leukocytosis 04/11/2017  . Hyperglycemia 04/11/2017  . Anxiety 04/11/2017    Past Surgical History:  Procedure Laterality Date  . BACK SURGERY     ruptured disc.  . BIOPSY  04/14/2017   Procedure: BIOPSY;  Surgeon: Rogene Houston, MD;  Location: [facility];  Service: Endoscopy;;  gastric  . CERVICAL ABLATION N/A 12/02/2018   Procedure: LASER ABLATION OF CERVIX;  Surgeon: Florian Buff, MD;  Location: [facility] ORS;  Service: Gynecology;  Laterality: N/A;  . CHOLECYSTECTOMY N/A 04/17/2017   Procedure: LAPAROSCOPIC CHOLECYSTECTOMY;  Surgeon: Aviva Signs, MD;  Location: [facility] ORS;  Service: General;  Laterality: N/A;  . COLPOSCOPY    . ESOPHAGOGASTRODUODENOSCOPY (EGD) WITH PROPOFOL N/A 04/14/2017   Procedure: ESOPHAGOGASTRODUODENOSCOPY (EGD) WITH PROPOFOL;  Surgeon: Rogene Houston, MD;  Location: [facility];  Service: Endoscopy;  Laterality: N/A;  . HERNIA REPAIR Bilateral    inguinal  . LASER ABLATION CONDOLAMATA N/A 12/02/2018   Procedure: LASER ABLATION CONDYLOMA ACCUMINATA LEFT AND RIGHT VULVA, PERINEUM AND PERIANAL (15 TOTAL);  Surgeon: Florian Buff, MD;  Location: [facility] ORS;  Service: Gynecology;  Laterality: N/A;     OB History    Gravida  2   Para  1   Term  1   Preterm      AB  1   Living  1     SAB  1   TAB      Ectopic      Multiple  0   Live Births  1           Family History  Problem Relation Age of Onset  . Anxiety disorder Mother   . Hyperlipidemia Mother   . Diabetes Maternal Grandfather   . Diabetes Cousin   . Learning disabilities Cousin     Social History   Tobacco Use  . Smoking status: Current Every Day Smoker    Packs/day: 0.50    Years: 5.00    Pack years: 2.50    Types: Cigarettes  . Smokeless tobacco: Never Used  Substance Use Topics  . Alcohol use: No  . Drug use: No    Home Medications Prior to Admission  medications   Medication Sig Start Date End Date Taking? Authorizing Provider  Aspirin-Acetaminophen-Caffeine (EXCEDRIN MIGRAINE PO) Take by mouth as needed.    [provider]  busPIRone (BUSPAR) 7.5 MG tablet Take 2 tablets (15 mg total) by mouth 2 (two) times daily. 07/12/19   Estill Dooms, NP  cyclobenzaprine (FLEXERIL) 10 MG tablet TAKE 1 TABLET BY MOUTH TWICE DAILY AS NEEDED FOR MUSCLE SPASM 12/03/19   Estill Dooms, NP  etonogestrel (NEXPLANON) 68 MG IMPL implant 1 each by Subdermal route once.    [provider]  fluconazole (DIFLUCAN) 150 MG tablet Take 1 now and repeat 1 in 3 days Patient not taking: Reported on 11/16/2019 11/15/19   Derrek Monaco A, NP  ibuprofen (ADVIL) 800 MG tablet Take 1 tablet (800 mg total) by mouth every 8 (eight) hours as needed. 11/16/19   Estill Dooms, NP  ketoconazole (NIZORAL) 2 % cream Apply 1 application topically 2 (two) times daily. Patient not taking: Reported on 11/29/2019 11/16/19   Estill Dooms, NP  PARoxetine (PAXIL) 40 MG tablet Take 1 tablet (40 mg total) by mouth every morning. 10/18/19   Estill Dooms, NP  promethazine (PHENERGAN) 25 MG tablet TAKE 1 TABLET BY MOUTH EVERY 6 HOURS AS NEEDED FOR NAUSEA AND VOMITING 12/03/19   Derrek Monaco A, NP  rizatriptan (MAXALT) 10 MG tablet Take 1 tablet (10 mg total) by mouth as needed for migraine. May repeat in 2 hours if needed 03/12/19   Estill Dooms, NP    Allergies    Patient has no known allergies.  Review of Systems   Review of Systems  Constitutional: Negative for activity change, appetite change, chills and fever.  HENT: Negative for sore throat and trouble swallowing.   Respiratory: Negative for chest tightness and shortness of breath.   Gastrointestinal: Positive for nausea and vomiting. Negative for abdominal pain and diarrhea.  Genitourinary: Positive for dysuria, flank pain, frequency and urgency. Negative for decreased urine  volume, difficulty urinating, hematuria, vaginal bleeding and vaginal discharge.  Musculoskeletal: Negative for back pain and myalgias.  Skin: Negative for rash.  Neurological: Negative for dizziness, weakness, numbness and headaches.  Hematological: Negative for adenopathy.  Psychiatric/Behavioral: Negative for confusion.    Physical Exam Updated Vital Signs BP 136/83 (BP Location: Right Arm)   Pulse 91   Temp 98.4 F (36.9 C) (Oral)   Resp 12   Ht 5\' 8"  (1.727 m)   Wt 113.4 kg   LMP 01/03/2020   SpO2 98%   BMI 38.01 kg/m   Physical Exam Vitals and nursing note reviewed.  Constitutional:  Appearance: She is well-developed. She is not ill-appearing or toxic-appearing.  HENT:     Head: Normocephalic.  Cardiovascular:     Rate and Rhythm: Normal rate and regular rhythm.     Pulses: Normal pulses.  Pulmonary:     Effort: Pulmonary effort is normal.     Breath sounds: Normal breath sounds.  Abdominal:     Palpations: Abdomen is soft.     Tenderness: There is no abdominal tenderness. There is no right CVA tenderness, left CVA tenderness, guarding or rebound.     Comments: Mild suprapubic tenderness on exam.  Remaining abdominal exam is soft and nontender.  No guarding or rebound tenderness.  No CVA tenderness.  Musculoskeletal:        General: Tenderness present. No signs of injury. Normal range of motion.     Cervical back: Normal range of motion and neck supple.     Comments: Mild tenderness palpation of the lower lumbar spine and bilateral paraspinal muscles.  No bony deformities.  Negative straight leg raise bilaterally.  Lymphadenopathy:     Cervical: No cervical adenopathy.  Skin:    General: Skin is warm.     Capillary Refill: Capillary refill takes less than 2 seconds.     Findings: No rash.  Neurological:     General: No focal deficit present.     Mental Status: She is alert.     Sensory: No sensory deficit.     Motor: No weakness.  Psychiatric:         Judgment: Judgment normal.     ED Results / Procedures / Treatments   Labs (all labs ordered are listed, but only abnormal results are displayed) Labs Reviewed  URINALYSIS, ROUTINE W REFLEX MICROSCOPIC - Abnormal; Notable for the following components:      Result Value   APPearance CLOUDY (*)    Hgb urine dipstick LARGE (*)    Leukocytes,Ua MODERATE (*)    Bacteria, UA RARE (*)    All other components within normal limits  COMPREHENSIVE METABOLIC PANEL - Abnormal; Notable for the following components:   Glucose, Bld 113 (*)    All other components within normal limits  PREGNANCY, URINE  CBC  LIPASE, BLOOD    EKG None  Radiology CT Renal Stone Study  Result Date: 01/18/2020 CLINICAL DATA:  Flank pain with dysuria EXAM: CT ABDOMEN AND PELVIS WITHOUT CONTRAST TECHNIQUE: Multidetector CT imaging of the abdomen and pelvis was performed following the standard protocol without oral or IV contrast. COMPARISON:  December 07, 2018 FINDINGS: Lower chest: Lung bases are clear. Hepatobiliary: No focal liver lesions are appreciable on this noncontrast enhanced study. Gallbladder is absent. There is no appreciable biliary duct dilatation. Pancreas: There is no pancreatic mass or inflammatory focus. Spleen: No splenic lesions are evident. Adrenals/Urinary Tract: Adrenals bilaterally appear normal. Kidneys bilaterally show no evident mass or hydronephrosis on either side. There is no evident renal or ureteral calculus on either side. Urinary bladder is midline with wall thickness within normal limits. Stomach/Bowel: There is no appreciable bowel wall or mesenteric thickening. No evident bowel obstruction. The terminal ileum appears normal. There is no evident free air or portal venous air. Vascular/Lymphatic: There is no abdominal aortic aneurysm. Vascular lesions are appreciable on this noncontrast enhanced study. There is no evident adenopathy in the abdomen or pelvis. Reproductive: There is a cystic  structure in the left vaginal wall measuring 2.0 x 1.9 cm. A similar cystic area in the right vaginal wall measures 1.7  x 1.4 cm. Uterus is anteverted. No evident pelvic mass apart from the cysts in the vaginal wall regions. Other: Appendix appears normal. No abscess or ascites in the abdomen or pelvis. Musculoskeletal: No blastic or lytic bone lesions. No abdominal wall or intramuscular lesions. IMPRESSION: 1. Cystic areas in left and right vaginal wall regions, likely Bartholin's gland cysts. 2. No renal or ureteral calculus. No hydronephrosis on either side. Urinary bladder wall thickness within normal limits. 3. No evident bowel obstruction. No abscess in the abdomen pelvis. Appendix appears normal. 4.  Gallbladder absent. Electronically Signed   By: Lowella Grip III M.D.   On: 01/18/2020 18:22    Procedures Procedures (including critical care time)  Medications Ordered in ED Medications  sodium chloride flush (NS) 0.9 % injection 3 mL (has no administration in time range)  ondansetron (ZOFRAN-ODT) disintegrating tablet 8 mg (has no administration in time range)  oxyCODONE-acetaminophen (PERCOCET/ROXICET) 5-325 MG per tablet 1 tablet (has no administration in time range)    ED Course  I have reviewed the triage vital signs and the nursing notes.  Pertinent labs & imaging results that were available during my care of the patient were reviewed by me and considered in my medical decision making (see chart for details).    MDM Rules/Calculators/A&P                      Patient here with lower back and lower abdominal pain with dysuria.  Symptoms are associated with nausea and vomiting.  No fever or chills.  Patient is well-appearing and nontoxic.  Mucous membranes are moist.  No peritoneal signs.  On review of laboratory results, patient is noted to have hematuria with moderate leukocytes in urine appears cloudy.  Urine culture pending.  Pain of lower back is felt to be musculoskeletal.   She does not have CVA tenderness on exam.  Since patient has history of prior kidney stones, I will obtain CT renal stone study to rule out possibility of infected stone.   CT renal stone study is negative for ureteral or renal stone. No stranding to suggest pyelonephritis. bartholin's cysts noted on CT, but pt denies vaginal pain, no pelvic pain on exam.   Urine culture is pending. Will start patient on antibiotics and she has an appointment upcoming with her gynecologist. Have advised to have her urine rechecked at that time.   Final Clinical Impression(s) / ED Diagnoses Final diagnoses:  Acute cystitis with hematuria    Rx / DC Orders ED Discharge Orders    None       Kem Parkinson, PA-C 01/20/20 1135    Fredia Sorrow, MD 01/22/20 [phone]

## 2020-01-18 NOTE — ED Triage Notes (Signed)
With lower abd pain and lower back pain for 2 days.  Pt c/o urine frequency and urgency.  Pt states she thinks she has a kidney stone and states with hx of kidney stones.

## 2020-01-18 NOTE — Discharge Instructions (Addendum)
Your CT scan today did not show evidence of a kidney stone. Your urine test does show an infection. It is important that you drink plenty of water and take the antibiotic as directed until its finished. The prescription for pyridium will temporarily cause your urine to be orange to brown in color this will resolve when she complete the medication course. Follow-up with your gynecologist as previously scheduled. You may have your urine rechecked at that time. Return to the ER for any worsening symptoms such as increasing pain, fever, vomiting

## 2020-01-18 NOTE — ED Notes (Signed)
Pt vaginal bleeding with small clots, started on 4/12.  Flow is heavy to light constantly.  Pt has implant still in place.

## 2020-01-19 MED FILL — Oxycodone w/ Acetaminophen Tab 5-325 MG: ORAL | Qty: 6 | Status: AC

## 2020-01-20 LAB — URINE CULTURE

## 2020-01-27 ENCOUNTER — Ambulatory Visit (INDEPENDENT_AMBULATORY_CARE_PROVIDER_SITE_OTHER): Payer: Medicaid Other | Admitting: Obstetrics & Gynecology

## 2020-01-27 ENCOUNTER — Other Ambulatory Visit (HOSPITAL_COMMUNITY)
Admission: RE | Admit: 2020-01-27 | Discharge: 2020-01-27 | Disposition: A | Discharge status: Home / Self Care | Payer: Medicaid Other | Source: Ambulatory Visit | Attending: Obstetrics & Gynecology | Admitting: Obstetrics & Gynecology

## 2020-01-27 ENCOUNTER — Encounter: Payer: Self-pay | Admitting: Obstetrics & Gynecology

## 2020-01-27 ENCOUNTER — Other Ambulatory Visit: Payer: Self-pay

## 2020-01-27 VITALS — BP 130/84 | HR 109 | Ht 68.0 in | Wt 253.0 lb

## 2020-01-27 DIAGNOSIS — R87610 Atypical squamous cells of undetermined significance on cytologic smear of cervix (ASC-US): Secondary | ICD-10-CM | POA: Diagnosis not present

## 2020-01-27 DIAGNOSIS — N762 Acute vulvitis: Secondary | ICD-10-CM | POA: Diagnosis not present

## 2020-01-27 DIAGNOSIS — R39198 Other difficulties with micturition: Secondary | ICD-10-CM | POA: Diagnosis not present

## 2020-01-27 DIAGNOSIS — Z1272 Encounter for screening for malignant neoplasm of vagina: Secondary | ICD-10-CM | POA: Diagnosis not present

## 2020-01-27 DIAGNOSIS — R87613 High grade squamous intraepithelial lesion on cytologic smear of cervix (HGSIL): Secondary | ICD-10-CM | POA: Insufficient documentation

## 2020-01-27 MED ORDER — FLUOCINONIDE-E 0.05 % EX CREA
1.0000 | TOPICAL_CREAM | Freq: Two times a day (BID) | CUTANEOUS | 1 refills | Status: DC
Start: 2020-01-27 — End: 2020-05-22

## 2020-01-27 NOTE — Progress Notes (Signed)
Chief Complaint  Patient presents with  . repeat pap    difficulty urinating; recently finished antibiotic;? yeast infection      29 y.o. G2P1011 Patient's last menstrual period was 01/03/2020. The current method of family planning is none.  Outpatient Encounter Medications as of 01/27/2020  Medication Sig  . Aspirin-Acetaminophen-Caffeine (EXCEDRIN MIGRAINE PO) Take by mouth as needed.  . busPIRone (BUSPAR) 7.5 MG tablet Take 2 tablets (15 mg total) by mouth 2 (two) times daily.  . cyclobenzaprine (FLEXERIL) 10 MG tablet TAKE 1 TABLET BY MOUTH TWICE DAILY AS NEEDED FOR MUSCLE SPASM  . etonogestrel (NEXPLANON) 68 MG IMPL implant 1 each by Subdermal route once.  Marland Kitchen ibuprofen (ADVIL) 800 MG tablet Take 1 tablet (800 mg total) by mouth every 8 (eight) hours as needed.  Marland Kitchen PARoxetine (PAXIL) 40 MG tablet Take 1 tablet (40 mg total) by mouth every morning.  . promethazine (PHENERGAN) 25 MG tablet TAKE 1 TABLET BY MOUTH EVERY 6 HOURS AS NEEDED FOR NAUSEA AND VOMITING  . rizatriptan (MAXALT) 10 MG tablet Take 1 tablet (10 mg total) by mouth as needed for migraine. May repeat in 2 hours if needed  . fluocinonide-emollient (LIDEX-E) 0.05 % cream Apply 1 application topically 2 (two) times daily.  . [DISCONTINUED] cephALEXin (KEFLEX) 500 MG capsule Take 1 capsule (500 mg total) by mouth 4 (four) times daily.  . [DISCONTINUED] fluconazole (DIFLUCAN) 150 MG tablet Take 1 now and repeat 1 in 3 days (Patient not taking: Reported on 11/16/2019)  . [DISCONTINUED] ketoconazole (NIZORAL) 2 % cream Apply 1 application topically 2 (two) times daily. (Patient not taking: Reported on 11/29/2019)  . [DISCONTINUED] oxyCODONE-acetaminophen (PERCOCET/ROXICET) 5-325 MG tablet Take 1 tablet by mouth every 4 (four) hours as needed for severe pain.  . [DISCONTINUED] phenazopyridine (PYRIDIUM) 200 MG tablet Take 1 tablet (200 mg total) by mouth 3 (three) times daily.   No facility-administered encounter medications  on file as of 01/27/2020.    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 Anxiety   . Bradycardia 04/17/2017  . Chronic back pain   . 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 Gastric ulcer   . Genital warts   . History of kidney stones   . Mental disorder   . Peptic ulcer disease 04/13/2017  . Sciatica   . Vaginal Pap smear, abnormal     Past Surgical History:  Procedure Laterality Date  . BACK SURGERY     ruptured disc.  . BIOPSY  04/14/2017   Procedure: BIOPSY;  Surgeon: Rogene Houston, MD;  [facility];  Service: Endoscopy;;  gastric  . CERVICAL ABLATION N/A 12/02/2018   Procedure: LASER ABLATION OF CERVIX;  Surgeon: Florian Buff, MD;  Location: [facility];  Service: Gynecology;  Laterality: N/A;  . CHOLECYSTECTOMY N/A 04/17/2017   Procedure: LAPAROSCOPIC CHOLECYSTECTOMY;  Surgeon: Aviva Signs, MD;  Location: [facility];  Service: General;  Laterality: N/A;  . COLPOSCOPY    . ESOPHAGOGASTRODUODENOSCOPY (EGD) WITH PROPOFOL N/A 04/14/2017   Procedure: ESOPHAGOGASTRODUODENOSCOPY (EGD) WITH PROPOFOL;  Surgeon: Rogene Houston, MD;  [facility];  Service: Endoscopy;  Laterality: N/A;  . HERNIA REPAIR Bilateral    inguinal  . LASER ABLATION CONDOLAMATA N/A 12/02/2018   Procedure: LASER ABLATION CONDYLOMA ACCUMINATA  LEFT AND RIGHT VULVA, PERINEUM AND PERIANAL (15 TOTAL);  Surgeon: Florian Buff, MD;  Location: [facility];  Service: Gynecology;  Laterality: N/A;    OB History    Gravida  2   Para  1   Term  1   Preterm      AB  1   Living  1     SAB  1   TAB      Ectopic      Multiple  0   Live Births  1           No Known Allergies  Social History   Socioeconomic History  . Marital status: Single    Spouse name: Not on file  . Number of children: 1  . Years  of education: Not on file  . Highest education level: Not on file  Occupational History  . Not on file  Tobacco Use  . Smoking status: Current Every Day Smoker    Packs/day: 0.50    Years: 5.00    Pack years: 2.50    Types: Cigarettes  . Smokeless tobacco: Never Used  Substance and Sexual Activity  . Alcohol use: No  . Drug use: No  . Sexual activity: Yes    Birth control/protection: Implant  Other Topics Concern  . Not on file  Social History Narrative  . Not on file   Social Determinants of Health   Financial Resource Strain:   . Difficulty of Paying Living Expenses:   Food Insecurity:   . Worried About Charity fundraiser in the Last Year:   . Arboriculturist in the Last Year:   Transportation Needs:   . Film/video editor (Medical):   Marland Kitchen Lack of Transportation (Non-Medical):   Physical Activity:   . Days of Exercise per Week:   . Minutes of Exercise per Session:   Stress:   . Feeling of Stress :   Social Connections:   . Frequency of Communication with Friends and Family:   . Frequency of Social Gatherings with Friends and Family:   . Attends Religious Services:   . Active Member of Clubs or Organizations:   . Attends Archivist Meetings:   Marland Kitchen Marital Status:     Family History  Problem Relation Age of Onset  . Anxiety disorder Mother   . Hyperlipidemia Mother   . Diabetes Maternal Grandfather   . Diabetes Cousin   . Learning disabilities Cousin     Medications:       Current Outpatient Medications:  .  Aspirin-Acetaminophen-Caffeine (EXCEDRIN MIGRAINE PO), Take by mouth as needed., Disp: , Rfl:  .  busPIRone (BUSPAR) 7.5 MG tablet, Take 2 tablets (15 mg total) by mouth 2 (two) times daily., Disp: 60 tablet, Rfl: 6 .  cyclobenzaprine (FLEXERIL) 10 MG tablet, TAKE 1 TABLET BY MOUTH TWICE DAILY AS NEEDED FOR MUSCLE SPASM, Disp: 30 tablet, Rfl: 0 .  etonogestrel (NEXPLANON) 68 MG IMPL implant, 1 each by Subdermal route once., Disp: , Rfl:  .   ibuprofen (ADVIL) 800 MG tablet, Take 1 tablet (800 mg total) by mouth every 8 (eight) hours as needed., Disp: 30 tablet, Rfl: 3 .  PARoxetine (PAXIL) 40 MG tablet, Take 1 tablet (40 mg total) by mouth every morning., Disp: 30 tablet, Rfl: 6 .  promethazine (PHENERGAN) 25 MG tablet, TAKE 1 TABLET BY MOUTH EVERY 6 HOURS AS NEEDED FOR NAUSEA AND VOMITING, Disp: 30 tablet, Rfl: 0 .  rizatriptan (MAXALT)  10 MG tablet, Take 1 tablet (10 mg total) by mouth as needed for migraine. May repeat in 2 hours if needed, Disp: 10 tablet, Rfl: 3 .  fluocinonide-emollient (LIDEX-E) 0.05 % cream, Apply 1 application topically 2 (two) times daily., Disp: 30 g, Rfl: 1  Objective Blood pressure 130/84, pulse (!) 109, height 5\' 8"  (1.727 m), weight 253 lb (114.8 kg), last menstrual period 01/03/2020, not currently breastfeeding.  General WDWN female NAD Vulva:  normal appearing vulva with no masses, tenderness or lesions Vagina:  normal mucosa, no discharge Cervix:  Normal no lesions Pap done    Pertinent ROS No burning with urination, frequency or urgency No nausea, vomiting or diarrhea Nor fever chills or other constitutional symptoms   Labs or studies Urine culture    Impression Diagnoses this Encounter::   ICD-10-CM   1. High grade squamous intraepithelial cervical dysplasia, S/P cervical conization  R87.613 Cytology - PAP( [place])  2. Difficulty urinating  R39.198 Urine Culture  3. 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 fluocinonide-emollient (LIDEX-E) 0.05 % cream    Sig: Apply 1 application topically 2 (two) times daily.    Dispense:  30 g    Refill:  1    Labs or Scans Ordered: Orders Placed This Encounter  Procedures  . Urine Culture    Management:: >Repeat Pap done, 2 years of q6 months Pap,  >Lidex E for what appears to be allergic vulvitis  Pt briefly discussed her on  going anxiety issues.  Her meds are managed by Derrek Monaco NP She is on Paxil/Buspar/Vistaril and states she is still having "nerve problems" If this regimen is inadequate I recommended she consider seeing a mental health professional as her management is becoming more complex and she is still unresponsive  Follow up Return in about 6 months (around 07/29/2020) for Follow up, with Dr Elonda Husky.       All questions were answered.

## 2020-01-29 LAB — URINE CULTURE: Organism ID, Bacteria: NO GROWTH

## 2020-01-31 ENCOUNTER — Other Ambulatory Visit: Payer: Self-pay | Admitting: Adult Health

## 2020-02-01 LAB — CYTOLOGY - PAP
Chlamydia: NEGATIVE
Comment: NEGATIVE
Comment: NEGATIVE
Comment: NORMAL
Diagnosis: UNDETERMINED — AB
High risk HPV: NEGATIVE
Neisseria Gonorrhea: NEGATIVE

## 2020-02-20 ENCOUNTER — Other Ambulatory Visit: Payer: Self-pay | Admitting: Adult Health

## 2020-02-22 ENCOUNTER — Encounter: Payer: Self-pay | Admitting: Adult Health

## 2020-02-22 ENCOUNTER — Ambulatory Visit (INDEPENDENT_AMBULATORY_CARE_PROVIDER_SITE_OTHER): Payer: Medicaid Other | Admitting: Adult Health

## 2020-02-22 VITALS — BP 124/82 | HR 94 | Ht 68.0 in | Wt 246.8 lb

## 2020-02-22 DIAGNOSIS — F329 Major depressive disorder, single episode, unspecified: Secondary | ICD-10-CM | POA: Diagnosis not present

## 2020-02-22 DIAGNOSIS — R071 Chest pain on breathing: Secondary | ICD-10-CM | POA: Diagnosis not present

## 2020-02-22 DIAGNOSIS — G479 Sleep disorder, unspecified: Secondary | ICD-10-CM

## 2020-02-22 DIAGNOSIS — R062 Wheezing: Secondary | ICD-10-CM

## 2020-02-22 DIAGNOSIS — F419 Anxiety disorder, unspecified: Secondary | ICD-10-CM | POA: Diagnosis not present

## 2020-02-22 HISTORY — DX: Chest pain on breathing: R07.1

## 2020-02-22 MED ORDER — TRAZODONE HCL 50 MG PO TABS
50.0000 mg | ORAL_TABLET | Freq: Every day | ORAL | 1 refills | Status: DC
Start: 2020-02-22 — End: 2020-03-14

## 2020-02-22 MED ORDER — CEPHALEXIN 500 MG PO CAPS: 500.0000 mg | ORAL_CAPSULE | Freq: Four times a day (QID) | ORAL | 0 refills | Status: DC

## 2020-02-22 NOTE — Progress Notes (Signed)
  Subjective:     Patient ID: Debbie Santana, female   DOB: May 16, 1991, 29 y.o.   MRN: [id number]  HPI Debbie Santana is a 29 year old white female, single, G2P1011, back in follow up on Paxil and Buspar.  PCP is [facility]  Review of Systems Not sleeping well +short of breath at times and pain in chest with breathing and other times too Feels shaky Stressed over break up with boyfriend,not eating as much Was taking vistaril tid prn seemed to make things worse, so stop Reviewed past medical,surgical, social and family history. Reviewed medications and allergies.     Objective:   Physical Exam BP 124/82 (BP Location: Left Arm, Patient Position: Sitting, Cuff Size: Normal)   Pulse 94   Ht 5\' 8"  (1.727 m)   Wt 246 lb 12.8 oz (111.9 kg)   BMI 37.53 kg/m Skin warm and dry. Neck: mid line trachea, normal thyroid, good ROM, no lymphadenopathy noted. Lungs:+inspiratory wheezing, bilaterally did not clear with cough. Cardiovascular: regular rate and rhythm.   PHQ 9 score was 16 no SI on paxil and buspar. Co exam with Terri Skains NP student.   Assessment:     1. Anxiety and depression Continue Paxil and buspar Eat small frequent meals  2. Wheezing on inspiration Get chest xray Decrease smoking Will rx keflex Meds ordered this encounter  Medications  . cephALEXin (KEFLEX) 500 MG capsule    Sig: Take 1 capsule (500 mg total) by mouth 4 (four) times daily.    Dispense:  28 capsule    Refill:  0    Order Specific Question:   Supervising Provider    Answer:   EURE, LUTHER H [[id number]]  . traZODone (DESYREL) 50 MG tablet    Sig: Take 1 tablet (50 mg total) by mouth at bedtime.    Dispense:  30 tablet    Refill:  1    Order Specific Question:   Supervising Provider    Answer:   Elonda Husky, LUTHER H [[id number]]    3. Chest pain on breathing Get chest xray   4. Sleep disturbance Will add trazodone     Plan:     Follow up in 2 weeks for ROS and to listen to lungs  If still stressed will try to get  in therapy, has medicaid.

## 2020-02-23 ENCOUNTER — Ambulatory Visit (HOSPITAL_COMMUNITY)
Admission: RE | Admit: 2020-02-23 | Discharge: 2020-02-23 | Disposition: A | Discharge status: Home / Self Care | Payer: Medicaid Other | Source: Ambulatory Visit | Attending: Adult Health | Admitting: Adult Health

## 2020-02-23 ENCOUNTER — Other Ambulatory Visit: Payer: Self-pay

## 2020-02-23 DIAGNOSIS — R05 Cough: Secondary | ICD-10-CM | POA: Diagnosis not present

## 2020-02-23 DIAGNOSIS — R071 Chest pain on breathing: Secondary | ICD-10-CM | POA: Insufficient documentation

## 2020-02-23 DIAGNOSIS — R062 Wheezing: Secondary | ICD-10-CM | POA: Diagnosis not present

## 2020-03-07 ENCOUNTER — Ambulatory Visit: Payer: Medicaid Other | Admitting: Adult Health

## 2020-03-08 ENCOUNTER — Other Ambulatory Visit: Payer: Self-pay | Admitting: Adult Health

## 2020-03-08 ENCOUNTER — Telehealth: Payer: Self-pay | Admitting: Adult Health
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
## 2020-03-08 NOTE — Telephone Encounter (Signed)
Telephoned patient at home number and left message with female.
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
## 2020-03-09 NOTE — Telephone Encounter (Signed)
Telephoned patient at home number and left message with female.

## 2020-03-14 ENCOUNTER — Encounter: Payer: Self-pay | Admitting: Adult Health

## 2020-03-14 ENCOUNTER — Ambulatory Visit (INDEPENDENT_AMBULATORY_CARE_PROVIDER_SITE_OTHER): Payer: Medicaid Other | Admitting: Adult Health

## 2020-03-14 VITALS — BP 130/86 | HR 89 | Ht 68.0 in | Wt 247.0 lb

## 2020-03-14 DIAGNOSIS — F418 Other specified anxiety disorders: Secondary | ICD-10-CM

## 2020-03-14 DIAGNOSIS — F32A Depression, unspecified: Secondary | ICD-10-CM

## 2020-03-14 MED ORDER — TRAZODONE HCL 50 MG PO TABS: 50.0000 mg | ORAL_TABLET | Freq: Every day | ORAL | 3 refills | Status: DC

## 2020-03-14 MED ORDER — PAROXETINE HCL 40 MG PO TABS: 40.0000 mg | ORAL_TABLET | ORAL | 6 refills | Status: DC

## 2020-03-14 MED ORDER — BUSPIRONE HCL 15 MG PO TABS: 15.0000 mg | ORAL_TABLET | Freq: Three times a day (TID) | ORAL | 6 refills | Status: DC

## 2020-03-14 NOTE — Progress Notes (Signed)
  Subjective:     Patient ID: Debbie Santana, female   DOB: [birth year], 29 y.o.   MRN: [id number]  HPI Debbie Santana is a 29 year old white female,single,G2P1011 back in follow up on meds and also to listen to lungs, had wheezing last visit, treated with keflex and had negative chest x ray. She is still smoking. PCP is [facility].  Review of Systems sleeping better Not as stressed feeling Reviewed past medical,surgical, social and family history. Reviewed medications and allergies.      Objective:   Physical Exam BP 130/86 (BP Location: Left Arm, Patient Position: Sitting, Cuff Size: Normal)   Pulse 89   Ht 5\' 8"  (1.727 m)   Wt 247 lb (112 kg)   BMI 37.56 kg/m  Skin warm and dry. Lungs: has some wheezing but clears with cough then clear to ausculation bilaterally. Cardiovascular: regular rate and rhythm. PHQ 9 score is 5, no SI is down from 16 on 02/22/20.    Assessment:     1. Anxiety and depression Will continue Paxil and increase Buspar Continue trazodone Meds ordered this encounter  Medications  . busPIRone (BUSPAR) 15 MG tablet    Sig: Take 1 tablet (15 mg total) by mouth 3 (three) times daily.    Dispense:  90 tablet    Refill:  6    Order Specific Question:   Supervising Provider    Answer:   Elonda Husky, LUTHER H [[id number]]  . PARoxetine (PAXIL) 40 MG tablet    Sig: Take 1 tablet (40 mg total) by mouth every morning.    Dispense:  30 tablet    Refill:  6    Order Specific Question:   Supervising Provider    Answer:   Elonda Husky, LUTHER H [[id number]]  . traZODone (DESYREL) 50 MG tablet    Sig: Take 1 tablet (50 mg total) by mouth at bedtime.    Dispense:  30 tablet    Refill:  3    Order Specific Question:   Supervising Provider    Answer:   Florian Buff [[id number]]      Plan:     Follow up in 3 months of sooner if needed

## 2020-04-10 ENCOUNTER — Other Ambulatory Visit: Payer: Self-pay | Admitting: Adult Health

## 2020-04-23 ENCOUNTER — Other Ambulatory Visit: Payer: Self-pay | Admitting: Adult Health

## 2020-04-25 ENCOUNTER — Emergency Department (HOSPITAL_COMMUNITY)
Admission: EM | Admit: 2020-04-25 | Discharge: 2020-04-25 | Disposition: A | Discharge status: Home / Self Care | Payer: Medicaid Other

## 2020-04-25 DIAGNOSIS — R109 Unspecified abdominal pain: Secondary | ICD-10-CM | POA: Diagnosis not present

## 2020-04-25 DIAGNOSIS — R1030 Lower abdominal pain, unspecified: Secondary | ICD-10-CM | POA: Diagnosis not present

## 2020-04-25 DIAGNOSIS — R748 Abnormal levels of other serum enzymes: Secondary | ICD-10-CM | POA: Diagnosis not present

## 2020-04-25 DIAGNOSIS — K5900 Constipation, unspecified: Secondary | ICD-10-CM | POA: Diagnosis not present

## 2020-04-25 DIAGNOSIS — R0602 Shortness of breath: Secondary | ICD-10-CM | POA: Diagnosis not present

## 2020-04-25 DIAGNOSIS — R112 Nausea with vomiting, unspecified: Secondary | ICD-10-CM | POA: Diagnosis not present

## 2020-04-25 DIAGNOSIS — K6389 Other specified diseases of intestine: Secondary | ICD-10-CM | POA: Diagnosis not present

## 2020-04-25 DIAGNOSIS — R6883 Chills (without fever): Secondary | ICD-10-CM | POA: Diagnosis not present

## 2020-04-25 DIAGNOSIS — M549 Dorsalgia, unspecified: Secondary | ICD-10-CM | POA: Diagnosis not present

## 2020-04-26 ENCOUNTER — Telehealth: Payer: Self-pay | Admitting: *Deleted

## 2020-04-26 DIAGNOSIS — R1084 Generalized abdominal pain: Secondary | ICD-10-CM | POA: Diagnosis not present

## 2020-04-26 DIAGNOSIS — K5900 Constipation, unspecified: Secondary | ICD-10-CM | POA: Diagnosis not present

## 2020-04-26 DIAGNOSIS — R52 Pain, unspecified: Secondary | ICD-10-CM | POA: Diagnosis not present

## 2020-04-26 DIAGNOSIS — R112 Nausea with vomiting, unspecified: Secondary | ICD-10-CM | POA: Diagnosis not present

## 2020-04-26 DIAGNOSIS — R1030 Lower abdominal pain, unspecified: Secondary | ICD-10-CM | POA: Diagnosis not present

## 2020-04-26 DIAGNOSIS — R748 Abnormal levels of other serum enzymes: Secondary | ICD-10-CM | POA: Diagnosis not present

## 2020-04-26 DIAGNOSIS — R109 Unspecified abdominal pain: Secondary | ICD-10-CM | POA: Diagnosis not present

## 2020-04-26 DIAGNOSIS — R000 Tachycardia, unspecified: Secondary | ICD-10-CM | POA: Diagnosis not present

## 2020-04-26 DIAGNOSIS — R0689 Other abnormalities of breathing: Secondary | ICD-10-CM | POA: Diagnosis not present

## 2020-04-26 NOTE — Telephone Encounter (Signed)
Debbie Santana presented to the ED and left before being seen by the provider on 04/25/20. The patient has been enrolled in an automated general discharge outreach program and 2 attempts to contact the patient will be made to follow up on their ED visit and subsequent needs. The care management team is available to provide assistance to this patient at any time.   Lenor Coffin, RN, BSN, [name] [phone]

## 2020-05-01 ENCOUNTER — Other Ambulatory Visit: Payer: Self-pay | Admitting: Adult Health

## 2020-05-10 ENCOUNTER — Ambulatory Visit (INDEPENDENT_AMBULATORY_CARE_PROVIDER_SITE_OTHER): Payer: Medicaid Other | Admitting: Adult Health

## 2020-05-10 ENCOUNTER — Other Ambulatory Visit: Payer: Self-pay | Admitting: Adult Health

## 2020-05-10 ENCOUNTER — Other Ambulatory Visit: Payer: Self-pay

## 2020-05-10 ENCOUNTER — Encounter: Payer: Self-pay | Admitting: Adult Health

## 2020-05-10 VITALS — BP 118/78 | HR 83 | Ht 68.0 in | Wt 245.0 lb

## 2020-05-10 DIAGNOSIS — F32A Depression, unspecified: Secondary | ICD-10-CM

## 2020-05-10 DIAGNOSIS — F329 Major depressive disorder, single episode, unspecified: Secondary | ICD-10-CM | POA: Diagnosis not present

## 2020-05-10 DIAGNOSIS — R110 Nausea: Secondary | ICD-10-CM

## 2020-05-10 DIAGNOSIS — K219 Gastro-esophageal reflux disease without esophagitis: Secondary | ICD-10-CM | POA: Diagnosis not present

## 2020-05-10 DIAGNOSIS — R198 Other specified symptoms and signs involving the digestive system and abdomen: Secondary | ICD-10-CM | POA: Insufficient documentation

## 2020-05-10 DIAGNOSIS — F419 Anxiety disorder, unspecified: Secondary | ICD-10-CM | POA: Diagnosis not present

## 2020-05-10 HISTORY — DX: Nausea: R11.0

## 2020-05-10 MED ORDER — PAROXETINE HCL 10 MG PO TABS
10.0000 mg | ORAL_TABLET | Freq: Every day | ORAL | 3 refills | Status: DC
Start: 2020-05-10 — End: 2020-05-18

## 2020-05-10 MED ORDER — PAROXETINE HCL 40 MG PO TABS: 40.0000 mg | ORAL_TABLET | ORAL | 6 refills | Status: DC

## 2020-05-10 MED ORDER — DICYCLOMINE HCL 10 MG PO CAPS: 10.0000 mg | ORAL_CAPSULE | Freq: Three times a day (TID) | ORAL | 3 refills | Status: DC

## 2020-05-10 NOTE — Progress Notes (Signed)
  Subjective:     Patient ID: Debbie Santana, female   DOB: 06-Jan-1991, 29 y.o.   MRN: [id number]  HPI Debbie Santana is a 29 year old white female,single, G2P1011 in requesting referral to GI. She says she has had stomach problems since middle school, has nausea, reflux and constipation and diarrhea and was told at 12 it was IBS,but has gotten worse.Her sister was just diagnosed with Crohn's. She has pain at times too, if can't have BM and has gone to ER several times.Last seen 8/4 at [facility] and had CT, showed no acute process and xray was normal.  She says anxiety is worse when having the GI issues and she will shake, like panic attack she says. PCP is [facility].  Review of Systems +nausea  +reflux +constipation and diarrhea +anxiety worse And does not seem to be associated with eating or not eating  Reviewed past medical,surgical, social and family history. Reviewed medications and allergies.     Objective:   Physical Exam BP 118/78 (BP Location: Right Arm, Patient Position: Sitting, Cuff Size: Normal)   Pulse 83   Ht 5\' 8"  (1.727 m)   Wt 245 lb (111.1 kg)   BMI 37.25 kg/m    .Skin warm and dry, abdomen: has bowel sounds in all 4 quadrants, is soft, no HSM noted, mild tenderness RLQ.    Upstream - 05/10/20 0916      Pregnancy Intention Screening   Does the patient want to become pregnant in the next year? No    Does the patient's partner want to become pregnant in the next year? No    Would the patient like to discuss contraceptive options today? No      Contraception Wrap Up   Current Method Hormonal Implant    End Method Hormonal Implant    Contraception Counseling Provided No          Assessment:     1. Nausea Refer to [place]  2. Alternating constipation and diarrhea Will try bentyl till seen at GI Refer to [place], Neil Crouch PA  3. Gastroesophageal reflux disease, unspecified whether esophagitis present Refer to [place]  4. Anxiety and depression Will increase paxil  to 50 mg Will refer to integrated behavorial health   Meds ordered this encounter  Medications  . dicyclomine (BENTYL) 10 MG capsule    Sig: Take 1 capsule (10 mg total) by mouth 4 (four) times daily -  before meals and at bedtime.    Dispense:  90 capsule    Refill:  3    Order Specific Question:   Supervising Provider    Answer:   Elonda Husky, LUTHER H [[id number]]  . PARoxetine (PAXIL) 10 MG tablet    Sig: Take 1 tablet (10 mg total) by mouth daily. Take a 40 mg and 10 mg t0 = 50 mg    Dispense:  30 tablet    Refill:  3    Order Specific Question:   Supervising Provider    Answer:   Elonda Husky, LUTHER H [[id number]]  . PARoxetine (PAXIL) 40 MG tablet    Sig: Take 1 tablet (40 mg total) by mouth every morning.    Dispense:  30 tablet    Refill:  6    Order Specific Question:   Supervising Provider    Answer:   Florian Buff [[id number]]      Plan:     Follow up in 3 months

## 2020-05-11 ENCOUNTER — Other Ambulatory Visit: Payer: Self-pay | Admitting: Adult Health

## 2020-05-11 DIAGNOSIS — R198 Other specified symptoms and signs involving the digestive system and abdomen: Secondary | ICD-10-CM

## 2020-05-11 DIAGNOSIS — K219 Gastro-esophageal reflux disease without esophagitis: Secondary | ICD-10-CM

## 2020-05-11 DIAGNOSIS — R110 Nausea: Secondary | ICD-10-CM

## 2020-05-11 MED ORDER — HYDROXYZINE HCL 25 MG PO TABS: 25.0000 mg | ORAL_TABLET | Freq: Four times a day (QID) | ORAL | 3 refills | Status: DC

## 2020-05-11 NOTE — Progress Notes (Signed)
Got note from [facility] pt seen at Dr Olevia Perches office in past, will refer back to him

## 2020-05-11 NOTE — Progress Notes (Signed)
Refill vistaril

## 2020-05-12 ENCOUNTER — Encounter (INDEPENDENT_AMBULATORY_CARE_PROVIDER_SITE_OTHER): Payer: Self-pay | Admitting: Gastroenterology

## 2020-05-15 ENCOUNTER — Other Ambulatory Visit: Payer: Self-pay

## 2020-05-15 ENCOUNTER — Encounter (HOSPITAL_COMMUNITY): Payer: Self-pay

## 2020-05-15 DIAGNOSIS — I959 Hypotension, unspecified: Secondary | ICD-10-CM | POA: Diagnosis not present

## 2020-05-15 DIAGNOSIS — R05 Cough: Secondary | ICD-10-CM | POA: Diagnosis not present

## 2020-05-15 DIAGNOSIS — G43909 Migraine, unspecified, not intractable, without status migrainosus: Secondary | ICD-10-CM | POA: Diagnosis not present

## 2020-05-15 DIAGNOSIS — F410 Panic disorder [episodic paroxysmal anxiety] without agoraphobia: Secondary | ICD-10-CM | POA: Insufficient documentation

## 2020-05-15 DIAGNOSIS — Z20822 Contact with and (suspected) exposure to covid-19: Secondary | ICD-10-CM | POA: Diagnosis not present

## 2020-05-15 DIAGNOSIS — G43009 Migraine without aura, not intractable, without status migrainosus: Secondary | ICD-10-CM | POA: Insufficient documentation

## 2020-05-15 DIAGNOSIS — R21 Rash and other nonspecific skin eruption: Secondary | ICD-10-CM | POA: Diagnosis not present

## 2020-05-15 DIAGNOSIS — E876 Hypokalemia: Secondary | ICD-10-CM | POA: Diagnosis not present

## 2020-05-15 DIAGNOSIS — K219 Gastro-esophageal reflux disease without esophagitis: Secondary | ICD-10-CM | POA: Insufficient documentation

## 2020-05-15 DIAGNOSIS — R0902 Hypoxemia: Secondary | ICD-10-CM | POA: Diagnosis not present

## 2020-05-15 DIAGNOSIS — F1210 Cannabis abuse, uncomplicated: Secondary | ICD-10-CM | POA: Insufficient documentation

## 2020-05-15 DIAGNOSIS — R5381 Other malaise: Secondary | ICD-10-CM | POA: Diagnosis not present

## 2020-05-15 DIAGNOSIS — R1111 Vomiting without nausea: Secondary | ICD-10-CM | POA: Diagnosis not present

## 2020-05-15 DIAGNOSIS — R112 Nausea with vomiting, unspecified: Secondary | ICD-10-CM | POA: Diagnosis not present

## 2020-05-15 DIAGNOSIS — R110 Nausea: Secondary | ICD-10-CM | POA: Diagnosis not present

## 2020-05-15 NOTE — ED Triage Notes (Signed)
Pt to er, pt states that she has been having a panic attack with vomiting and diarrhea since yesterday.  Pt states she has a hx of panic attacks and usually gets ativan when she goes to the hospital.

## 2020-05-15 NOTE — ED Notes (Signed)
Pt states that she wants her iv out, pt had 20 gauge iv placed by ems, iv removed from L ac, dressing place, cath intact.

## 2020-05-16 ENCOUNTER — Other Ambulatory Visit: Payer: Self-pay

## 2020-05-16 ENCOUNTER — Encounter (HOSPITAL_COMMUNITY): Payer: Self-pay | Admitting: Emergency Medicine

## 2020-05-16 ENCOUNTER — Emergency Department (HOSPITAL_COMMUNITY)
Admission: EM | Admit: 2020-05-16 | Discharge: 2020-05-16 | Disposition: A | Discharge status: Home / Self Care | Payer: Medicaid Other | Attending: Emergency Medicine | Admitting: Emergency Medicine

## 2020-05-16 DIAGNOSIS — G43009 Migraine without aura, not intractable, without status migrainosus: Secondary | ICD-10-CM | POA: Insufficient documentation

## 2020-05-16 DIAGNOSIS — E876 Hypokalemia: Secondary | ICD-10-CM | POA: Insufficient documentation

## 2020-05-16 DIAGNOSIS — Z20822 Contact with and (suspected) exposure to covid-19: Secondary | ICD-10-CM | POA: Insufficient documentation

## 2020-05-16 DIAGNOSIS — R000 Tachycardia, unspecified: Secondary | ICD-10-CM | POA: Insufficient documentation

## 2020-05-16 DIAGNOSIS — K219 Gastro-esophageal reflux disease without esophagitis: Secondary | ICD-10-CM | POA: Insufficient documentation

## 2020-05-16 DIAGNOSIS — R112 Nausea with vomiting, unspecified: Secondary | ICD-10-CM | POA: Diagnosis not present

## 2020-05-16 DIAGNOSIS — G43909 Migraine, unspecified, not intractable, without status migrainosus: Secondary | ICD-10-CM | POA: Diagnosis not present

## 2020-05-16 DIAGNOSIS — F17210 Nicotine dependence, cigarettes, uncomplicated: Secondary | ICD-10-CM | POA: Insufficient documentation

## 2020-05-16 NOTE — ED Triage Notes (Signed)
Pt c/o of abdominal pain with emesis and panic attacks. Pt was here yesterday for the same but did not wait to be seen.

## 2020-05-17 ENCOUNTER — Telehealth: Payer: Self-pay | Admitting: *Deleted

## 2020-05-17 ENCOUNTER — Emergency Department (HOSPITAL_COMMUNITY): Payer: Medicaid Other

## 2020-05-17 ENCOUNTER — Emergency Department (HOSPITAL_COMMUNITY)
Admission: EM | Admit: 2020-05-17 | Discharge: 2020-05-17 | Disposition: A | Discharge status: Home / Self Care | Payer: Medicaid Other | Source: Home / Self Care | Attending: Emergency Medicine | Admitting: Emergency Medicine

## 2020-05-17 DIAGNOSIS — R21 Rash and other nonspecific skin eruption: Secondary | ICD-10-CM | POA: Diagnosis not present

## 2020-05-17 DIAGNOSIS — E876 Hypokalemia: Secondary | ICD-10-CM

## 2020-05-17 DIAGNOSIS — R05 Cough: Secondary | ICD-10-CM | POA: Diagnosis not present

## 2020-05-17 DIAGNOSIS — K219 Gastro-esophageal reflux disease without esophagitis: Secondary | ICD-10-CM

## 2020-05-17 DIAGNOSIS — G43009 Migraine without aura, not intractable, without status migrainosus: Secondary | ICD-10-CM

## 2020-05-17 DIAGNOSIS — R112 Nausea with vomiting, unspecified: Secondary | ICD-10-CM

## 2020-05-17 LAB — CBC
HCT: 45.9 % (ref 36.0–46.0)
HCT: 42.6 % (ref 36.0–46.0)
Hemoglobin: 15.7 g/dL — ABNORMAL HIGH (ref 12.0–15.0)
Hemoglobin: 14.7 g/dL (ref 12.0–15.0)
MCH: 31.2 pg (ref 26.0–34.0)
MCH: 31.9 pg (ref 26.0–34.0)
MCHC: 34.2 g/dL (ref 30.0–36.0)
MCHC: 34.5 g/dL (ref 30.0–36.0)
MCV: 91.1 fL (ref 80.0–100.0)
MCV: 92.4 fL (ref 80.0–100.0)
Platelets: 314 10*3/uL (ref 150–400)
Platelets: 256 10*3/uL (ref 150–400)
RBC: 5.04 MIL/uL (ref 3.87–5.11)
RBC: 4.61 MIL/uL (ref 3.87–5.11)
RDW: 12.6 % (ref 11.5–15.5)
RDW: 12.4 % (ref 11.5–15.5)
WBC: 23.4 10*3/uL — ABNORMAL HIGH (ref 4.0–10.5)
WBC: 16.1 10*3/uL — ABNORMAL HIGH (ref 4.0–10.5)
nRBC: 0 % (ref 0.0–0.2)
nRBC: 0 % (ref 0.0–0.2)

## 2020-05-17 LAB — COMPREHENSIVE METABOLIC PANEL
ALT: 18 U/L (ref 0–44)
AST: 17 U/L (ref 15–41)
Albumin: 4.8 g/dL (ref 3.5–5.0)
Alkaline Phosphatase: 66 U/L (ref 38–126)
Anion gap: 15 (ref 5–15)
BUN: 16 mg/dL (ref 6–20)
CO2: 21 mmol/L — ABNORMAL LOW (ref 22–32)
Calcium: 9.9 mg/dL (ref 8.9–10.3)
Chloride: 102 mmol/L (ref 98–111)
Creatinine, Ser: 1.07 mg/dL — ABNORMAL HIGH (ref 0.44–1.00)
GFR calc Af Amer: >60 mL/min (ref >60)
GFR calc non Af Amer: >60 mL/min (ref >60)
Glucose, Bld: 130 mg/dL — ABNORMAL HIGH (ref 70–99)
Potassium: 2.9 mmol/L — ABNORMAL LOW (ref 3.5–5.1)
Sodium: 138 mmol/L (ref 135–145)
Total Bilirubin: 1.1 mg/dL (ref 0.3–1.2)
Total Protein: 8.2 g/dL — ABNORMAL HIGH (ref 6.5–8.1)

## 2020-05-17 LAB — URINALYSIS, ROUTINE W REFLEX MICROSCOPIC
Bacteria, UA: NONE SEEN
Bilirubin Urine: NEGATIVE
Glucose, UA: NEGATIVE mg/dL
Hgb urine dipstick: NEGATIVE
Ketones, ur: 80 mg/dL — AB
Leukocytes,Ua: NEGATIVE
Nitrite: NEGATIVE
Protein, ur: 30 mg/dL — AB
Specific Gravity, Urine: 1.024 (ref 1.005–1.030)
pH: 6 (ref 5.0–8.0)

## 2020-05-17 LAB — LIPASE, BLOOD: Lipase: 24 U/L (ref 11–51)

## 2020-05-17 LAB — PREGNANCY, URINE: Preg Test, Ur: NEGATIVE

## 2020-05-17 LAB — SARS CORONAVIRUS 2 BY RT PCR (HOSPITAL ORDER, PERFORMED IN ~~LOC~~ HOSPITAL LAB): SARS Coronavirus 2: NEGATIVE

## 2020-05-17 MED ORDER — SODIUM CHLORIDE 0.9 % IV BOLUS
1000.0000 mL | Freq: Once | INTRAVENOUS | Status: AC
  Administered 2020-05-17: 1000 mL via INTRAVENOUS

## 2020-05-17 MED ORDER — FAMOTIDINE IN NACL 20-0.9 MG/50ML-% IV SOLN
20.0000 mg | Freq: Once | INTRAVENOUS | Status: AC
  Administered 2020-05-17: 20 mg via INTRAVENOUS
  Filled 2020-05-17: qty 50

## 2020-05-17 MED ORDER — ONDANSETRON HCL 4 MG PO TABS: 4.0000 mg | ORAL_TABLET | Freq: Four times a day (QID) | ORAL | 0 refills | Status: DC

## 2020-05-17 MED ORDER — ONDANSETRON HCL 4 MG/2ML IJ SOLN
4.0000 mg | Freq: Once | INTRAMUSCULAR | Status: AC
  Administered 2020-05-17: 4 mg via INTRAVENOUS
  Filled 2020-05-17: qty 2

## 2020-05-17 MED ORDER — HALOPERIDOL LACTATE 5 MG/ML IJ SOLN
2.0000 mg | Freq: Once | INTRAMUSCULAR | Status: AC
  Administered 2020-05-17: 2 mg via INTRAVENOUS
  Filled 2020-05-17: qty 1

## 2020-05-17 MED ORDER — KETOROLAC TROMETHAMINE 30 MG/ML IJ SOLN
30.0000 mg | Freq: Once | INTRAMUSCULAR | Status: AC
  Administered 2020-05-17: 30 mg via INTRAVENOUS
  Filled 2020-05-17: qty 1

## 2020-05-17 MED ORDER — METOCLOPRAMIDE HCL 5 MG/ML IJ SOLN
10.0000 mg | Freq: Once | INTRAMUSCULAR | Status: AC
  Administered 2020-05-17: 10 mg via INTRAVENOUS
  Filled 2020-05-17: qty 2

## 2020-05-17 MED ORDER — POTASSIUM CHLORIDE CRYS ER 20 MEQ PO TBCR: 20.0000 meq | EXTENDED_RELEASE_TABLET | Freq: Two times a day (BID) | ORAL | 0 refills | Status: DC

## 2020-05-17 MED ORDER — OMEPRAZOLE 20 MG PO CPDR: 20.0000 mg | DELAYED_RELEASE_CAPSULE | Freq: Every day | ORAL | 0 refills | Status: DC

## 2020-05-17 MED ORDER — ACETAMINOPHEN 500 MG PO TABS: 1000.0000 mg | ORAL_TABLET | Freq: Once | ORAL | Status: DC

## 2020-05-17 MED ORDER — DIPHENHYDRAMINE HCL 50 MG/ML IJ SOLN
25.0000 mg | Freq: Once | INTRAMUSCULAR | Status: AC
  Administered 2020-05-17: 25 mg via INTRAVENOUS
  Filled 2020-05-17: qty 1

## 2020-05-17 MED ORDER — DEXAMETHASONE SODIUM PHOSPHATE 10 MG/ML IJ SOLN
10.0000 mg | Freq: Once | INTRAMUSCULAR | Status: AC
  Administered 2020-05-17: 10 mg via INTRAVENOUS
  Filled 2020-05-17: qty 1

## 2020-05-17 NOTE — ED Provider Notes (Signed)
Patient CARE signed out to discharge once patient more awake. Patient presented with recurrent vomiting, marijuana use history.  Patient received 2 L IV fluids, vital signs normal on exam.  Patient tolerating oral liquids.  Patient's white blood cell count significantly elevated greater than 20,000 likely from vomiting/stress.  Repeat white blood cell count decreased.  Urinalysis no signs of infection.  On exam patient has no significant abdominal tenderness, mild discomfort epigastrium, patient has had gallbladder removed in the past.  Patient stable for outpatient follow-up.  Golda Acre, MD 05/17/20 [phone]

## 2020-05-17 NOTE — Telephone Encounter (Signed)
Debbie Santana presented to the ED and left before being seen by the provider on 05/16/20. The patient has been enrolled in an automated general discharge outreach program and 2 attempts to contact the patient will be made to follow up on their ED visit and subsequent needs. The care management team is available to provide assistance to this patient at any time.   Lenor Coffin, RN, BSN, [place] Patient [place] [phone]

## 2020-05-17 NOTE — Telephone Encounter (Signed)
Emailed request to [facility] to schedule NP appointment.                                                                       Shawnese Magner                                                 P[facility]                                                 [phone]

## 2020-05-17 NOTE — Discharge Instructions (Addendum)
It appears you have another refill on your Phenergan you can use. Drink plenty of fluids (clear liquids) then start a bland diet later this morning such as toast, crackers, jello, [employer] chicken noodle soup. Use the zofran or phenergan you have been prescribed for nausea or vomiting.  Avoid marijuana because it can make abdominal pain and vomiting worse.  Look at the information about acid reflux.  Take the medication as prescribed.

## 2020-05-17 NOTE — ED Provider Notes (Signed)
[facility] EMERGENCY DEPARTMENT Provider Note   CSN: [id number] Arrival date & time: 05/16/20  2215   Time seen 2:33 AM  History Chief Complaint  Patient presents with  . Abdominal Pain    Debbie Santana is a 29 y.o. female.  HPI   Patient states she has had a migraine headache that is not being relieved by her Maxalt, nausea and vomiting for the past 3 days.  She states she has vomited about 10 times a day without hematemesis.  She states it did look sort of brown this morning.  She also complains of a burning epigastric pain and when she vomits she has burning fluid in her throat.  She is having diarrhea a few times a day without blood.  She denies any fever.  She denies eating anything that may have caused this.  She states she has frequent stomach flareups.  She has a mild cough but denies sore throat or rhinorrhea.  Patient states she took the [employer] vaccine.  PCP family tree   Past Medical History:  Diagnosis Date  . Anxiety   . Bradycardia 04/17/2017  . Chronic back pain   . 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 Gastric ulcer   . Genital warts   . History of kidney stones   . Mental disorder   . Peptic ulcer disease 04/13/2017  . Sciatica   . Vaginal Pap smear, abnormal     Patient Active Problem List   Diagnosis Date Noted  . Gastroesophageal reflux disease 05/10/2020  . Alternating constipation and diarrhea 05/10/2020  . Nausea 05/10/2020  . Wheezing on inspiration 02/22/2020  . Chest pain on breathing 02/22/2020  . Sleep disturbance 02/22/2020  . Intertrigo 11/16/2019  . Yeast infection of the skin 09/01/2019  . Migraine without aura and without status migrainosus, not intractable 02/19/2019  . Anxiety and depression 02/19/2019  . Nexplanon in place 02/19/2019  . Irregular intermenstrual bleeding 02/19/2019  . Nausea & vomiting 12/07/2018  . Low back pain 12/07/2018  . Gastroenteritis 12/07/2018  . Acute lower UTI 12/07/2018  . Dehydration 12/07/2018    . 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 History of gestational hypertension 05/21/2018  . Condyloma 04/15/2018  . History of abnormal cervical Pap smear 11/04/2017  . Cigarette smoker 04/26/2017  . Reflux esophagitis 04/17/2017  . Obesity 04/17/2017  . Tobacco abuse 04/17/2017  . Peptic ulcer disease 04/13/2017  . 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 Intractable abdominal pain 04/11/2017  . Leukocytosis 04/11/2017  . Hyperglycemia 04/11/2017  . Anxiety 04/11/2017    Past Surgical History:  Procedure Laterality Date  . BACK SURGERY     ruptured disc.  . BIOPSY  04/14/2017   Procedure: BIOPSY;  Surgeon: Rogene Houston, MD;  Location: [facility] END[facility] SUITE;  Service: Endoscopy;;  gastric  . CERVICAL ABLATION N/A 12/02/2018   Procedure: LASER ABLATION OF CERVIX;  Surgeon: Florian Buff, MD;  Location: [facility] ORS;  Service: Gynecology;  Laterality: N/A;  . CHOLECYSTECTOMY N/A 04/17/2017   Procedure: LAPAROSCOPIC CHOLECYSTECTOMY;  Surgeon: Aviva Signs, MD;  Location: [facility] ORS;  Service: General;  Laterality: N/A;  . COLPOSCOPY    . ESOPHAGOGASTRODUODENOSCOPY (EGD) WITH PROPOFOL N/A 04/14/2017   Procedure: ESOPHAGOGASTRODUODENOSCOPY (EGD) WITH PROPOFOL;  Surgeon: Rogene Houston, MD;  Location: [facility] END[facility] SU[facility];  Service: Endoscopy;  Laterality: N/A;  . HERNIA REPAIR Bilateral    inguinal  . LASER ABLATION CONDOLAMATA N/A 12/02/2018   Procedure: LASER ABLATION CONDYLOMA ACCUMINATA LEFT AND RIGHT VULVA, PERINEUM AND PERIANAL (  15 TOTAL);  Surgeon: Florian Buff, MD;  Location: [facility] ORS;  Service: Gynecology;  Laterality: N/A;     OB History    Gravida  2   Para  1   Term  1   Preterm      AB  1   Living  1     SAB  1   TAB      Ectopic      Multiple  0   Live Births  1           Family History  Problem Relation Age of Onset  . Anxiety disorder Mother   . Hyperlipidemia Mother   . Crohn's disease Sister   . Diabetes Maternal Grandfather   . Diabetes Cousin   . Learning  disabilities Cousin     Social History   Tobacco Use  . Smoking status: Current Every Day Smoker    Packs/day: 0.50    Years: 5.00    Pack years: 2.50    Types: Cigarettes  . Smokeless tobacco: Never Used  Vaping Use  . Vaping Use: Never used  Substance Use Topics  . Alcohol use: No  . Drug use: No  unemployed Has a 68 yo son  Home Medications Prior to Admission medications   Medication Sig Start Date End Date Taking? Authorizing Provider  Aspirin-Acetaminophen-Caffeine (EXCEDRIN MIGRAINE PO) Take by mouth as needed. Patient not taking: Reported on 05/10/2020    [provider]  busPIRone (BUSPAR) 15 MG tablet Take 1 tablet (15 mg total) by mouth 3 (three) times daily. 03/14/20   Estill Dooms, NP  busPIRone (BUSPAR) 7.5 MG tablet Take 2 tablets by mouth twice daily 05/02/20   Derrek Monaco A, NP  cephALEXin (KEFLEX) 500 MG capsule Take 1 capsule (500 mg total) by mouth 4 (four) times daily. Patient not taking: Reported on 03/14/2020 02/22/20   Estill Dooms, NP  cyclobenzaprine (FLEXERIL) 10 MG tablet TAKE 1 TABLET BY MOUTH TWICE DAILY AS NEEDED FOR MUSCLE SPASM Patient not taking: Reported on 05/10/2020 01/31/20   Estill Dooms, NP  dicyclomine (BENTYL) 10 MG capsule Take 1 capsule (10 mg total) by mouth 4 (four) times daily -  before meals and at bedtime. 05/10/20   Estill Dooms, NP  etonogestrel (NEXPLANON) 68 MG IMPL implant 1 each by Subdermal route once.    [provider]  fluocinonide-emollient (LIDEX-E) 0.05 % cream Apply 1 application topically 2 (two) times daily. 01/27/20   Florian Buff, MD  hydrOXYzine (ATARAX/VISTARIL) 25 MG tablet Take 1 tablet (25 mg total) by mouth every 6 (six) hours. 05/11/20   Estill Dooms, NP  omeprazole (PRILOSEC) 20 MG capsule Take 1 capsule (20 mg total) by mouth daily. 05/17/20   Rolland Porter, MD  ondansetron (ZOFRAN) 4 MG tablet Take 1 tablet (4 mg total) by mouth every 6 (six) hours. 05/17/20    Rolland Porter, MD  ondansetron (ZOFRAN-ODT) 4 MG disintegrating tablet Take 4 mg by mouth every 8 (eight) hours as needed. 04/29/20   [provider]  PARoxetine (PAXIL) 10 MG tablet Take 1 tablet (10 mg total) by mouth daily. Take a 40 mg and 10 mg t0 = 50 mg 05/10/20   Derrek Monaco A, NP  PARoxetine (PAXIL) 40 MG tablet Take 1 tablet (40 mg total) by mouth every morning. 05/10/20   Estill Dooms, NP  potassium chloride SA (KLOR-CON) 20 MEQ tablet Take 1 tablet (20 mEq total) by mouth 2 (  two) times daily. 05/17/20   Rolland Porter, MD  potassium chloride SA (KLOR-CON) 20 MEQ tablet Take 1 tablet (20 mEq total) by mouth 2 (two) times daily. 05/17/20   Rolland Porter, MD  promethazine (PHENERGAN) 25 MG tablet TAKE 1 TABLET BY MOUTH EVERY 6 HOURS AS NEEDED FOR NAUSEA AND VOMITING 04/24/20   Derrek Monaco A, NP  rizatriptan (MAXALT) 10 MG tablet TAKE 1 TABLET BY MOUTH ONCE DAILY AS NEEDED FOR  MIGRAINE,  MAY  REPEAT  IN  2  HOURS  IF  NEEDED. 01/31/20   Estill Dooms, NP  traZODone (DESYREL) 50 MG tablet TAKE 1 TABLET BY MOUTH AT BEDTIME 05/11/20   Estill Dooms, NP    Allergies    Patient has no known allergies.  Review of Systems   Review of Systems  All other systems reviewed and are negative.   Physical Exam Updated Vital Signs BP 132/84 (BP Location: Left Arm)   Pulse 86   Temp 98.5 F (36.9 C) (Oral)   Resp 19   Ht 5\' 8"  (1.727 m)   Wt 111.1 kg   SpO2 98%   BMI 37.25 kg/m   Physical Exam Vitals and nursing note reviewed.  Constitutional:      General: She is in acute distress.     Appearance: She is obese. She is not ill-appearing or toxic-appearing.     Comments: Traumatic, lots of deep breathing  HENT:     Head: Normocephalic and atraumatic.     Right Ear: External ear normal.     Left Ear: External ear normal.     Mouth/Throat:     Mouth: Mucous membranes are dry.  Eyes:     Extraocular Movements: Extraocular movements intact.     Conjunctiva/sclera:  Conjunctivae normal.     Pupils: Pupils are equal, round, and reactive to light.  Cardiovascular:     Rate and Rhythm: Regular rhythm. Tachycardia present.     Pulses: Normal pulses.     Heart sounds: Normal heart sounds.  Pulmonary:     Effort: Pulmonary effort is normal. No respiratory distress.     Breath sounds: Normal breath sounds.  Abdominal:     Tenderness: There is abdominal tenderness. There is no guarding or rebound.    Musculoskeletal:        General: Normal range of motion.     Cervical back: Normal range of motion.  Skin:    General: Skin is warm and dry.     Findings: No rash.  Neurological:     General: No focal deficit present.     Mental Status: She is alert and oriented to person, place, and time.     Cranial Nerves: No cranial nerve deficit.  Psychiatric:        Mood and Affect: Affect is labile.        Speech: Speech is rapid and pressured.        Behavior: Behavior is agitated.     ED Results / Procedures / Treatments   Labs (all labs ordered are listed, but only abnormal results are displayed) Results for orders placed or performed during the hospital encounter of 05/17/20  SARS Coronavirus 2 by RT PCR (hospital order, performed in [facility] hospital lab) Nasopharyngeal Nasopharyngeal Swab   Specimen: Nasopharyngeal Swab  Result Value Ref Range   SARS Coronavirus 2 NEGATIVE NEGATIVE  Lipase, blood  Result Value Ref Range   Lipase 24 11 - 51 U/L  Comprehensive metabolic panel  Result Value Ref Range   Sodium 138 135 - 145 mmol/L   Potassium 2.9 (L) 3.5 - 5.1 mmol/L   Chloride 102 98 - 111 mmol/L   CO2 21 (L) 22 - 32 mmol/L   Glucose, Bld 130 (H) 70 - 99 mg/dL   BUN 16 6 - 20 mg/dL   Creatinine, Ser 1.07 (H) 0.44 - 1.00 mg/dL   Calcium 9.9 8.9 - 10.3 mg/dL   Total Protein 8.2 (H) 6.5 - 8.1 g/dL   Albumin 4.8 3.5 - 5.0 g/dL   AST 17 15 - 41 U/L   ALT 18 0 - 44 U/L   Alkaline Phosphatase 66 38 - 126 U/L   Total Bilirubin 1.1 0.3 - 1.2 mg/dL    GFR calc non Af Amer >60 >60 mL/min   GFR calc Af Amer >60 >60 mL/min   Anion gap 15 5 - 15  CBC  Result Value Ref Range   WBC 23.4 (H) 4.0 - 10.5 K/uL   RBC 5.04 3.87 - 5.11 MIL/uL   Hemoglobin 15.7 (H) 12.0 - 15.0 g/dL   HCT 45.9 36 - 46 %   MCV 91.1 80.0 - 100.0 fL   MCH 31.2 26.0 - 34.0 pg   MCHC 34.2 30.0 - 36.0 g/dL   RDW 12.6 11.5 - 15.5 %   Platelets 314 150 - 400 K/uL   nRBC 0.0 0.0 - 0.2 %   Laboratory interpretation all normal except leukocytosis consistent with her history of frequent vomiting, hypokalemia consistent with vomiting, minor renal insufficiency consistent with dehydration    EKG EKG Interpretation  Date/Time:  Tuesday May 16 2020 22:37:05 EDT Ventricular Rate:  110 PR Interval:  130 QRS Duration: 74 QT Interval:  328 QTC Calculation: 443 R Axis:   61 Text Interpretation: Sinus tachycardia Otherwise normal ECG No STEMI Confirmed by Nanda Quinton [phone]) on 05/16/2020 10:57:20 PM   Radiology DG Chest Port 1 View  Result Date: 05/17/2020 CLINICAL DATA:  30 year old female with cough. EXAM: PORTABLE CHEST 1 VIEW COMPARISON:  Chest radiograph dated 02/23/2020. FINDINGS: The heart size and mediastinal contours are within normal limits. Both lungs are clear. The visualized skeletal structures are unremarkable. IMPRESSION: No active disease. Electronically Signed   By: Anner Crete M.D.   On: 05/17/2020 03:15    Procedures Procedures (including critical care time)  Medications Ordered in ED Medications  sodium chloride 0.9 % bolus 1,000 mL (0 mLs Intravenous Stopped 05/17/20 0607)  sodium chloride 0.9 % bolus 1,000 mL (0 mLs Intravenous Stopped 05/17/20 0607)  metoCLOPramide (REGLAN) injection 10 mg (10 mg Intravenous Given 05/17/20 0306)  diphenhydrAMINE (BENADRYL) injection 25 mg (25 mg Intravenous Given 05/17/20 0305)  dexamethasone (DECADRON) injection 10 mg (10 mg Intravenous Given 05/17/20 0305)  famotidine (PEPCID) IVPB 20 mg premix (0 mg  Intravenous Stopped 05/17/20 0607)  ondansetron (ZOFRAN) injection 4 mg (4 mg Intravenous Given 05/17/20 0646)  sodium chloride 0.9 % bolus 1,000 mL (1,000 mLs Intravenous New Bag/Given 05/17/20 0644)  ketorolac (TORADOL) 30 MG/ML injection 30 mg (30 mg Intravenous Given 05/17/20 0647)  haloperidol lactate (HALDOL) injection 2 mg (2 mg Intravenous Given 05/17/20 0645)    ED Course  I have reviewed the triage vital signs and the nursing notes.  Pertinent labs & imaging results that were available during my care of the patient were reviewed by me and considered in my medical decision making (see chart for details).    MDM Rules/Calculators/A&P  Patient was given IV fluids and IV migraine cocktail.  She was given IV Pepcid for her complaints of burning epigastric abdominal pain consistent with GERD.  Recheck at 6:15 AM patient had been quiet however she states she still having nausea and her headaches better but still present.  She was given IV Zofran.  She states she was smoking marijuana but she quit a week ago.  She was given IV Haldol and Toradol which should help with her headache and her abdominal discomfort.  Patient states she has not had any urinary output yet after the 2 L of normal saline and she was given 1/3 L of normal saline.  Recheck at 7:15 AM patient is laying on her right side sleeping.  She does not awaken when I say her name or touch her leg.  She has gotten about 500 cc of her last bolus.  I spoke to Dr. Reather Converse at change of shift, he can discharge when she is awakened enough to be discharged.  I am assuming that she is doing better now.  9  Final Clinical Impression(s) / ED Diagnoses Final diagnoses:  Migraine without aura and without status migrainosus, not intractable  Non-intractable vomiting with nausea, unspecified vomiting type  Hypokalemia  Gastroesophageal reflux disease without esophagitis    Rx / DC Orders ED Discharge Orders          Ordered    potassium chloride SA (KLOR-CON) 20 MEQ tablet  2 times daily        05/17/20 0638    potassium chloride SA (KLOR-CON) 20 MEQ tablet  2 times daily        05/17/20 0730    ondansetron (ZOFRAN) 4 MG tablet  Every 6 hours        05/17/20 0730    omeprazole (PRILOSEC) 20 MG capsule  Daily        05/17/20 0736          Plan discharge  Rolland Porter, MD, Barbette Or, MD 05/17/20 [phone]

## 2020-05-18 ENCOUNTER — Ambulatory Visit (INDEPENDENT_AMBULATORY_CARE_PROVIDER_SITE_OTHER): Payer: Medicaid Other | Admitting: Adult Health

## 2020-05-18 ENCOUNTER — Emergency Department (HOSPITAL_COMMUNITY): Payer: Medicaid Other

## 2020-05-18 ENCOUNTER — Other Ambulatory Visit: Payer: Self-pay

## 2020-05-18 ENCOUNTER — Telehealth: Payer: Self-pay

## 2020-05-18 ENCOUNTER — Emergency Department (HOSPITAL_COMMUNITY)
Admission: EM | Admit: 2020-05-18 | Discharge: 2020-05-18 | Disposition: A | Discharge status: Home / Self Care | Payer: Medicaid Other | Attending: Emergency Medicine | Admitting: Emergency Medicine

## 2020-05-18 ENCOUNTER — Encounter (HOSPITAL_COMMUNITY): Payer: Self-pay

## 2020-05-18 ENCOUNTER — Encounter: Payer: Self-pay | Admitting: Adult Health

## 2020-05-18 VITALS — BP 151/101 | HR 89 | Ht 68.0 in | Wt 233.0 lb

## 2020-05-18 DIAGNOSIS — Z79899 Other long term (current) drug therapy: Secondary | ICD-10-CM | POA: Diagnosis not present

## 2020-05-18 DIAGNOSIS — G8929 Other chronic pain: Secondary | ICD-10-CM | POA: Insufficient documentation

## 2020-05-18 DIAGNOSIS — R110 Nausea: Secondary | ICD-10-CM

## 2020-05-18 DIAGNOSIS — R1013 Epigastric pain: Secondary | ICD-10-CM

## 2020-05-18 DIAGNOSIS — K859 Acute pancreatitis without necrosis or infection, unspecified: Secondary | ICD-10-CM | POA: Insufficient documentation

## 2020-05-18 DIAGNOSIS — N854 Malposition of uterus: Secondary | ICD-10-CM | POA: Diagnosis not present

## 2020-05-18 DIAGNOSIS — F17210 Nicotine dependence, cigarettes, uncomplicated: Secondary | ICD-10-CM | POA: Insufficient documentation

## 2020-05-18 DIAGNOSIS — F410 Panic disorder [episodic paroxysmal anxiety] without agoraphobia: Secondary | ICD-10-CM | POA: Diagnosis not present

## 2020-05-18 DIAGNOSIS — E860 Dehydration: Secondary | ICD-10-CM | POA: Diagnosis not present

## 2020-05-18 DIAGNOSIS — R1012 Left upper quadrant pain: Secondary | ICD-10-CM

## 2020-05-18 DIAGNOSIS — N898 Other specified noninflammatory disorders of vagina: Secondary | ICD-10-CM | POA: Diagnosis not present

## 2020-05-18 DIAGNOSIS — R451 Restlessness and agitation: Secondary | ICD-10-CM | POA: Diagnosis not present

## 2020-05-18 DIAGNOSIS — N907 Vulvar cyst: Secondary | ICD-10-CM | POA: Diagnosis not present

## 2020-05-18 DIAGNOSIS — K760 Fatty (change of) liver, not elsewhere classified: Secondary | ICD-10-CM | POA: Diagnosis not present

## 2020-05-18 LAB — URINE CULTURE

## 2020-05-18 LAB — URINALYSIS, ROUTINE W REFLEX MICROSCOPIC
Bilirubin Urine: NEGATIVE
Glucose, UA: NEGATIVE mg/dL
Hgb urine dipstick: NEGATIVE
Ketones, ur: 5 mg/dL — AB
Leukocytes,Ua: NEGATIVE
Nitrite: NEGATIVE
Protein, ur: 30 mg/dL — AB
Specific Gravity, Urine: 1.015 (ref 1.005–1.030)
pH: 6 (ref 5.0–8.0)

## 2020-05-18 LAB — COMPREHENSIVE METABOLIC PANEL
ALT: 26 U/L (ref 0–44)
AST: 22 U/L (ref 15–41)
Albumin: 4.4 g/dL (ref 3.5–5.0)
Alkaline Phosphatase: 62 U/L (ref 38–126)
Anion gap: 10 (ref 5–15)
BUN: 13 mg/dL (ref 6–20)
CO2: 22 mmol/L (ref 22–32)
Calcium: 9.3 mg/dL (ref 8.9–10.3)
Chloride: 102 mmol/L (ref 98–111)
Creatinine, Ser: 0.91 mg/dL (ref 0.44–1.00)
GFR calc Af Amer: >60 mL/min (ref >60)
GFR calc non Af Amer: >60 mL/min (ref >60)
Glucose, Bld: 107 mg/dL — ABNORMAL HIGH (ref 70–99)
Potassium: 3.1 mmol/L — ABNORMAL LOW (ref 3.5–5.1)
Sodium: 134 mmol/L — ABNORMAL LOW (ref 135–145)
Total Bilirubin: 1.2 mg/dL (ref 0.3–1.2)
Total Protein: 7.3 g/dL (ref 6.5–8.1)

## 2020-05-18 LAB — CBC
HCT: 42.6 % (ref 36.0–46.0)
Hemoglobin: 14.8 g/dL (ref 12.0–15.0)
MCH: 31.9 pg (ref 26.0–34.0)
MCHC: 34.7 g/dL (ref 30.0–36.0)
MCV: 91.8 fL (ref 80.0–100.0)
Platelets: 277 10*3/uL (ref 150–400)
RBC: 4.64 MIL/uL (ref 3.87–5.11)
RDW: 12.4 % (ref 11.5–15.5)
WBC: 16.2 10*3/uL — ABNORMAL HIGH (ref 4.0–10.5)
nRBC: 0 % (ref 0.0–0.2)

## 2020-05-18 LAB — LIPASE, BLOOD: Lipase: 88 U/L — ABNORMAL HIGH (ref 11–51)

## 2020-05-18 LAB — POC URINE PREG, ED: Preg Test, Ur: NEGATIVE

## 2020-05-18 MED ORDER — ONDANSETRON 4 MG PO TBDP
4.0000 mg | ORAL_TABLET | Freq: Once | ORAL | Status: AC
  Administered 2020-05-18: 4 mg via ORAL
  Filled 2020-05-18: qty 1

## 2020-05-18 MED ORDER — ALUM & MAG HYDROXIDE-SIMETH 200-200-20 MG/5ML PO SUSP: 30.0000 mL | Freq: Once | ORAL | Status: DC

## 2020-05-18 MED ORDER — FENTANYL CITRATE (PF) 100 MCG/2ML IJ SOLN
50.0000 ug | Freq: Once | INTRAMUSCULAR | Status: AC
  Administered 2020-05-18: 50 ug via INTRAVENOUS
  Filled 2020-05-18: qty 2

## 2020-05-18 MED ORDER — ONDANSETRON 4 MG PO TBDP: 4.0000 mg | ORAL_TABLET | Freq: Three times a day (TID) | ORAL | 0 refills | Status: DC | PRN

## 2020-05-18 MED ORDER — IOHEXOL 300 MG/ML  SOLN
100.0000 mL | Freq: Once | INTRAMUSCULAR | Status: AC | PRN
  Administered 2020-05-18: 100 mL via INTRAVENOUS

## 2020-05-18 MED ORDER — MORPHINE SULFATE (PF) 4 MG/ML IV SOLN
4.0000 mg | Freq: Once | INTRAVENOUS | Status: AC
  Administered 2020-05-18: 4 mg via INTRAVENOUS
  Filled 2020-05-18: qty 1

## 2020-05-18 MED ORDER — ONDANSETRON HCL 4 MG/2ML IJ SOLN
4.0000 mg | Freq: Once | INTRAMUSCULAR | Status: AC
  Administered 2020-05-18: 4 mg via INTRAVENOUS
  Filled 2020-05-18: qty 2

## 2020-05-18 NOTE — Discharge Instructions (Addendum)
Keep your appointment with the gastroenterologist as you have arranged.  In the interim I recommend maintaining a clear liquid diet only for the next 2 days as it appears you may have a very mild pancreatitis and resting this digestive organ helps to resolve this problem.  If your symptoms are improved after 2 days, you may slowly bump up your diet as tolerated.  You have been prescribed dissolvable Zofran to help you better with your nausea symptoms and so that you can tolerate your other medications.  If you develop uncontrolled vomiting or fevers or chills or worsening abdominal pain, return here for recheck.

## 2020-05-18 NOTE — ED Provider Notes (Signed)
[facility] EMERGENCY DEPARTMENT Provider Note   CSN: [id number] Arrival date & time: 05/18/20  2505     History Chief Complaint  Patient presents with  . Abdominal Pain  . Anxiety    Debbie Santana is a 29 y.o. female with a history as outlined below, most significant for erosive gastritis with history of PUD, history of alternating constipation and diarrhea and anxiety presenting for evaluation of abdominal pain. She describes left upper abdominal pain which is been waxing and waning since it started several days ago, has become more intense and now with radiation into her left mid back. She denies EtOH use. She does have a history of marijuana use, but last used 1 week ago. She also reports increase in her chronic anxiety secondary to this abdominal pain. She has been seen by her primary GYN provider who has arranged for GI consultation but this appointment is not until September 23. She was prescribed Bentyl which has given her equivocal improvement in her symptoms. Of note, she was seen here 2 days ago for treatment of a migraine headache and the same abdominal pain, at which time her work-up was reassuring. Denies fevers or chills. Nausea with frequent episodes of vomiting. Denies hematemesis. She has been unable to tolerate her medications for the past 2 days, stating she was prescribed regular Zofran which does not work nearly as well as the Zofran ODT. She is requesting this in place of her other Zofran.  The history is provided by the patient.       Past Medical History:  Diagnosis Date  . Anxiety   . Bradycardia 04/17/2017  . Chronic back pain   . 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 Gastric ulcer   . Genital warts   . History of kidney stones   . Mental disorder   . Peptic ulcer disease 04/13/2017  . Sciatica   . Vaginal Pap smear, abnormal     Patient Active Problem List   Diagnosis Date Noted  . Panic attack 05/18/2020  . Epigastric pain 05/18/2020  . Agitated  05/18/2020  . Gastroesophageal reflux disease 05/10/2020  . Alternating constipation and diarrhea 05/10/2020  . Nausea 05/10/2020  . Wheezing on inspiration 02/22/2020  . Chest pain on breathing 02/22/2020  . Sleep disturbance 02/22/2020  . Intertrigo 11/16/2019  . Yeast infection of the skin 09/01/2019  . Migraine without aura and without status migrainosus, not intractable 02/19/2019  . Anxiety and depression 02/19/2019  . Nexplanon in place 02/19/2019  . Irregular intermenstrual bleeding 02/19/2019  . Nausea & vomiting 12/07/2018  . Low back pain 12/07/2018  . Gastroenteritis 12/07/2018  . Acute lower UTI 12/07/2018  . Dehydration 12/07/2018  . 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 History of gestational hypertension 05/21/2018  . Condyloma 04/15/2018  . History of abnormal cervical Pap smear 11/04/2017  . Cigarette smoker 04/26/2017  . Reflux esophagitis 04/17/2017  . Obesity 04/17/2017  . Tobacco abuse 04/17/2017  . Peptic ulcer disease 04/13/2017  . 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 Intractable abdominal pain 04/11/2017  . Leukocytosis 04/11/2017  . Hyperglycemia 04/11/2017  . Anxiety 04/11/2017    Past Surgical History:  Procedure Laterality Date  . BACK SURGERY     ruptured disc.  . BIOPSY  04/14/2017   Procedure: BIOPSY;  Surgeon: Rogene Houston, MD;  Location: [facility] SU[facility];  Service: Endoscopy;;  gastric  . CERVICAL ABLATION N/A 12/02/2018   Procedure: LASER ABLATION OF CERVIX;  Surgeon: Florian Buff, MD;  Location: [facility]  ORS;  Service: Gynecology;  Laterality: N/A;  . CHOLECYSTECTOMY N/A 04/17/2017   Procedure: LAPAROSCOPIC CHOLECYSTECTOMY;  Surgeon: Aviva Signs, MD;  Location: [facility] ORS;  Service: General;  Laterality: N/A;  . COLPOSCOPY    . ESOPHAGOGASTRODUODENOSCOPY (EGD) WITH PROPOFOL N/A 04/14/2017   Procedure: ESOPHAGOGASTRODUODENOSCOPY (EGD) WITH PROPOFOL;  Surgeon: Rogene Houston, MD;  Location: [facility];  Service: Endoscopy;  Laterality: N/A;  .  HERNIA REPAIR Bilateral    inguinal  . LASER ABLATION CONDOLAMATA N/A 12/02/2018   Procedure: LASER ABLATION CONDYLOMA ACCUMINATA LEFT AND RIGHT VULVA, PERINEUM AND PERIANAL (15 TOTAL);  Surgeon: Florian Buff, MD;  Location: [facility] ORS;  Service: Gynecology;  Laterality: N/A;     OB History    Gravida  2   Para  1   Term  1   Preterm      AB  1   Living  1     SAB  1   TAB      Ectopic      Multiple  0   Live Births  1           Family History  Problem Relation Age of Onset  . Anxiety disorder Mother   . Hyperlipidemia Mother   . Crohn's disease Sister   . Diabetes Maternal Grandfather   . Diabetes Cousin   . Learning disabilities Cousin     Social History   Tobacco Use  . Smoking status: Current Every Day Smoker    Packs/day: 0.50    Years: 5.00    Pack years: 2.50    Types: Cigarettes  . Smokeless tobacco: Never Used  Vaping Use  . Vaping Use: Never used  Substance Use Topics  . Alcohol use: No  . Drug use: No    Home Medications Prior to Admission medications   Medication Sig Start Date End Date Taking? Authorizing Provider  busPIRone (BUSPAR) 15 MG tablet Take 1 tablet (15 mg total) by mouth 3 (three) times daily. Patient not taking: Reported on 05/17/2020 03/14/20   Estill Dooms, NP  busPIRone (BUSPAR) 7.5 MG tablet Take 2 tablets by mouth twice daily Patient not taking: Reported on 05/18/2020 05/02/20   Estill Dooms, NP  cyclobenzaprine (FLEXERIL) 10 MG tablet TAKE 1 TABLET BY MOUTH TWICE DAILY AS NEEDED FOR MUSCLE SPASM Patient not taking: Reported on 05/10/2020 01/31/20   Estill Dooms, NP  dicyclomine (BENTYL) 10 MG capsule Take 1 capsule (10 mg total) by mouth 4 (four) times daily -  before meals and at bedtime. 05/10/20   Estill Dooms, NP  etonogestrel (NEXPLANON) 68 MG IMPL implant 1 each by Subdermal route once.    [provider]  fluocinonide-emollient (LIDEX-E) 0.05 % cream Apply 1 application topically  2 (two) times daily. Patient not taking: Reported on 05/18/2020 01/27/20   Florian Buff, MD  hydrOXYzine (ATARAX/VISTARIL) 25 MG tablet Take 1 tablet (25 mg total) by mouth every 6 (six) hours. 05/11/20   Estill Dooms, NP  omeprazole (PRILOSEC) 20 MG capsule Take 1 capsule (20 mg total) by mouth daily. 05/17/20   Rolland Porter, MD  ondansetron (ZOFRAN ODT) 4 MG disintegrating tablet Take 1 tablet (4 mg total) by mouth every 8 (eight) hours as needed for nausea or vomiting. 05/18/20   Tore Carreker, Almyra Free, PA-C  ondansetron (ZOFRAN) 4 MG tablet Take 1 tablet (4 mg total) by mouth every 6 (six) hours. 05/17/20   Rolland Porter, MD  PARoxetine (PAXIL) 40 MG tablet  Take 1 tablet (40 mg total) by mouth every morning. 05/10/20   Estill Dooms, NP  potassium chloride SA (KLOR-CON) 20 MEQ tablet Take 1 tablet (20 mEq total) by mouth 2 (two) times daily. 05/17/20   Rolland Porter, MD  promethazine (PHENERGAN) 25 MG tablet TAKE 1 TABLET BY MOUTH EVERY 6 HOURS AS NEEDED FOR NAUSEA AND VOMITING 04/24/20   Derrek Monaco A, NP  rizatriptan (MAXALT) 10 MG tablet TAKE 1 TABLET BY MOUTH ONCE DAILY AS NEEDED FOR  MIGRAINE,  MAY  REPEAT  IN  2  HOURS  IF  NEEDED. 01/31/20   Estill Dooms, NP  traZODone (DESYREL) 50 MG tablet TAKE 1 TABLET BY MOUTH AT BEDTIME 05/11/20   Estill Dooms, NP    Allergies    Patient has no known allergies.  Review of Systems   Review of Systems  Constitutional: Negative for chills and fever.  HENT: Negative for congestion and sore throat.   Eyes: Negative.   Respiratory: Negative for chest tightness and shortness of breath.   Cardiovascular: Negative for chest pain.  Gastrointestinal: Positive for abdominal pain, nausea and vomiting.  Genitourinary: Negative.   Musculoskeletal: Negative for arthralgias, joint swelling and neck pain.  Skin: Negative.  Negative for rash and wound.  Neurological: Negative for dizziness, weakness, light-headedness, numbness and headaches.    Psychiatric/Behavioral: The patient is nervous/anxious.     Physical Exam Updated Vital Signs BP 112/71 (BP Location: Right Arm)   Pulse 75   Temp 98.7 F (37.1 C) (Oral)   Resp 18   Ht 5\' 8"  (1.727 m)   Wt 105.7 kg   SpO2 97%   BMI 35.43 kg/m   Physical Exam Vitals and nursing note reviewed.  Constitutional:      Appearance: She is well-developed. She is obese.  HENT:     Head: Normocephalic and atraumatic.  Eyes:     Conjunctiva/sclera: Conjunctivae normal.  Cardiovascular:     Rate and Rhythm: Normal rate and regular rhythm.     Heart sounds: Normal heart sounds.  Pulmonary:     Effort: Pulmonary effort is normal.     Breath sounds: Normal breath sounds. No wheezing.  Abdominal:     General: Abdomen is protuberant. Bowel sounds are normal. There is no distension.     Palpations: Abdomen is soft.     Tenderness: There is abdominal tenderness in the left upper quadrant. There is no guarding or rebound.     Hernia: No hernia is present.  Musculoskeletal:        General: Normal range of motion.     Cervical back: Normal range of motion.  Skin:    General: Skin is warm and dry.  Neurological:     General: No focal deficit present.     Mental Status: She is alert.  Psychiatric:        Mood and Affect: Mood is anxious.        Behavior: Behavior normal.     ED Results / Procedures / Treatments   Labs (all labs ordered are listed, but only abnormal results are displayed) Labs Reviewed  LIPASE, BLOOD - Abnormal; Notable for the following components:      Result Value   Lipase 88 (*)    All other components within normal limits  COMPREHENSIVE METABOLIC PANEL - Abnormal; Notable for the following components:   Sodium 134 (*)    Potassium 3.1 (*)    Glucose, Bld 107 (*)  All other components within normal limits  CBC - Abnormal; Notable for the following components:   WBC 16.2 (*)    All other components within normal limits  URINALYSIS, ROUTINE W REFLEX  MICROSCOPIC - Abnormal; Notable for the following components:   APPearance HAZY (*)    Ketones, ur 5 (*)    Protein, ur 30 (*)    Bacteria, UA RARE (*)    All other components within normal limits  POC URINE PREG, ED    EKG None  Radiology CT ABDOMEN PELVIS W CONTRAST  Result Date: 05/18/2020 CLINICAL DATA:  Left-sided abdominal pain with nausea and vomiting EXAM: CT ABDOMEN AND PELVIS WITH CONTRAST TECHNIQUE: Multidetector CT imaging of the abdomen and pelvis was performed using the standard protocol following bolus administration of intravenous contrast. CONTRAST:  16mL OMNIPAQUE IOHEXOL 300 MG/ML  SOLN COMPARISON:  April 26, 2020 FINDINGS: Lower chest: There is mild atelectatic change in the right base. Lung bases otherwise are clear. Hepatobiliary: There is mild fatty infiltration near the fissure for the ligamentum teres. Beyond mild fatty infiltration, no focal liver lesions are evident. The gallbladder wall is not appreciably thickened. There is no intrahepatic biliary duct dilatation. The common hepatic duct measures 10 mm in diameter, upper normal for post cholecystectomy state. No obstructing lesion is seen by CT in the biliary ductal system. Pancreas: There is no appreciable pancreatic mass or inflammatory focus. Spleen: No splenic lesions are evident. Adrenals/Urinary Tract: Adrenals bilaterally appear normal. Kidneys bilaterally show no evident mass or hydronephrosis on either side. There is no appreciable renal or ureteral calculus on either side. Urinary bladder is midline with wall thickness within normal limits. Stomach/Bowel: There is no appreciable bowel wall or mesenteric thickening. The terminal ileum appears normal. There is a degree of fatty infiltration in the ileocecal valve. There is no evident bowel obstruction. There is no appreciable free air or portal venous air. Vascular/Lymphatic: There is no abdominal aortic aneurysm. No arterial vascular lesions are appreciable.  Major venous structures appear patent. No evident adenopathy in the abdomen or pelvis. Reproductive: The uterus is anteverted. There is an apparent dominant follicle in the right ovary measuring 2.2 x 1.9 cm. There is apparent cystic change in each vaginal labium. An apparent cystic area arising in the right vaginal labium measures 2.3 x 1.3 cm. A similar appearing area on the left measures 1.7 x 1.3 cm. The cystic areas presumably represent Bartholin's gland cysts. Other: The appendix appears normal. There is no evident abscess or ascites in the abdomen or pelvis. Musculoskeletal: No blastic or lytic bone lesions. There is no intramuscular or abdominal wall lesion evident. IMPRESSION: 1. A cause for patient's symptoms has not been established with this study. 2. No bowel wall thickening or bowel obstruction. No abscess in the abdomen or pelvis. Appendix appears normal. 3. Stable apparent bilateral Bartholin's gland cysts in the vaginal labia. 4. No renal or ureteral calculi. No hydronephrosis. Urinary bladder wall thickness normal. 5. Gallbladder absent. Common hepatic duct upper normal in size. No biliary ductal obstruction evident by CT. Electronically Signed   By: Lowella Grip III M.D.   On: 05/18/2020 16:49   DG Chest Port 1 View  Result Date: 05/17/2020 CLINICAL DATA:  29 year old female with cough. EXAM: PORTABLE CHEST 1 VIEW COMPARISON:  Chest radiograph dated 02/23/2020. FINDINGS: The heart size and mediastinal contours are within normal limits. Both lungs are clear. The visualized skeletal structures are unremarkable. IMPRESSION: No active disease. Electronically Signed   By: Milas Hock  Radparvar M.D.   On: 05/17/2020 03:15    Procedures Procedures (including critical care time)  Medications Ordered in ED Medications  morphine 4 MG/ML injection 4 mg (4 mg Intravenous Given 05/18/20 1616)  ondansetron (ZOFRAN) injection 4 mg (4 mg Intravenous Given 05/18/20 1615)  iohexol (OMNIPAQUE) 300 MG/ML  solution 100 mL (100 mLs Intravenous Contrast Given 05/18/20 1634)  ondansetron (ZOFRAN-ODT) disintegrating tablet 4 mg (4 mg Oral Given 05/18/20 1749)  fentaNYL (SUBLIMAZE) injection 50 mcg (50 mcg Intravenous Given 05/18/20 1750)    ED Course  I have reviewed the triage vital signs and the nursing notes.  Pertinent labs & imaging results that were available during my care of the patient were reviewed by me and considered in my medical decision making (see chart for details).    MDM Rules/Calculators/A&P                          Labs and imaging reviewed and discussed with patient. She does have an elevated lipase which is new in comparison to prior labs. She denies EtOH use. She does endorse worse pain after meals however has been constant today despite no p.o. intake. Clinically she is not dehydrated. Her abdominal exam is reassuring, there is no guarding on her exam. Her CT scan does not suggest acute pancreatitis, although her symptoms certainly along with this elevation in her lipase. She is status post cholecystectomy. She was given instructions for acute pancreatitis including clear liquid diet for the next 2 days, then slowly advancing as tolerated. Encouraged to continue using her Bentyl. She was given ODT Zofran to better control her nausea and allowing her to get her other psych medicines. Of note, patient denies SI/HI. She was encouraged to follow-up with the GI specialist as arranged previously. Return precautions were also outlined. Final Clinical Impression(s) / ED Diagnoses Final diagnoses:  Left upper quadrant abdominal pain  Acute pancreatitis without infection or necrosis, unspecified pancreatitis type    Rx / DC Orders ED Discharge Orders         Ordered    ondansetron (ZOFRAN ODT) 4 MG disintegrating tablet  Every 8 hours PRN        05/18/20 1738           Landis Martins 05/18/20 2009    Noemi Chapel, MD 05/19/20 [phone]

## 2020-05-18 NOTE — Telephone Encounter (Signed)
VM to call to sch a New Patient appt    Called and left a msg for Pamala Hurry to call us back

## 2020-05-18 NOTE — ED Triage Notes (Signed)
Pt presents to ED with complaints of recurrent mid abdominal pain with emesis. Pt was seen on 08/24. Pt states it hasn't gotten any better. Pt states she is also having panic attacks and increased anxiety from the abdominal pain. Pt denies SI/HI

## 2020-05-18 NOTE — Progress Notes (Signed)
  Subjective:     Patient ID: Debbie Santana, female   DOB: 08-Mar-1991, 29 y.o.   MRN: [id number]  HPI Vinaya is a 29 year old white female, single, G2P1011, in complaining of panic attack, nausea, abdominal pain and back pain and is trying to vomit.She was seen in ER yesterday for migraine and had IV Fluids, her mom says she has been like this for 4 days. She is agitated and pacing.  She has appt with GI in September and is worried she has Crohn's like her sister.   Review of Systems +panic feelings +agitation +abdominal pain +nausea and she tries to vomit, she says it has blood in it  +back pain  She denies being suicidal but says she can't take it  Reviewed past medical,surgical, social and family history. Reviewed medications and allergies.     Objective:   Physical Exam BP (!) 151/101 (BP Location: Left Arm, Patient Position: Standing, Cuff Size: Normal)   Pulse 89   Ht 5\' 8"  (1.727 m)   Wt 233 lb (105.7 kg)   BMI 35.43 kg/m   Skin warm and dry. Lungs: clear to ausculation bilaterally. Cardiovascular: regular rate and rhythm. Abdomen is soft and tender in epigastric area and mid low pelvic area, as best I can palpate she is moving around     Assessment:     1. Panic attack  2. Epigastric pain  3. Nausea  4. Agitated    Plan:     Go to ER now I called and spoke with Shirlean Mylar

## 2020-05-19 ENCOUNTER — Other Ambulatory Visit: Payer: Self-pay

## 2020-05-19 ENCOUNTER — Inpatient Hospital Stay (HOSPITAL_COMMUNITY)
Admission: EM | Admit: 2020-05-19 | Discharge: 2020-05-22 | DRG: 392 | Disposition: A | Discharge status: Home / Self Care | Payer: Medicaid Other | Attending: Internal Medicine | Admitting: Internal Medicine

## 2020-05-19 ENCOUNTER — Encounter (HOSPITAL_COMMUNITY): Payer: Self-pay

## 2020-05-19 ENCOUNTER — Emergency Department (HOSPITAL_COMMUNITY): Payer: Medicaid Other

## 2020-05-19 DIAGNOSIS — E876 Hypokalemia: Secondary | ICD-10-CM | POA: Diagnosis present

## 2020-05-19 DIAGNOSIS — G8929 Other chronic pain: Secondary | ICD-10-CM | POA: Diagnosis present

## 2020-05-19 DIAGNOSIS — Z6838 Body mass index (BMI) 38.0-38.9, adult: Secondary | ICD-10-CM

## 2020-05-19 DIAGNOSIS — R0789 Other chest pain: Secondary | ICD-10-CM | POA: Diagnosis not present

## 2020-05-19 DIAGNOSIS — M5440 Lumbago with sciatica, unspecified side: Secondary | ICD-10-CM

## 2020-05-19 DIAGNOSIS — D72829 Elevated white blood cell count, unspecified: Secondary | ICD-10-CM | POA: Diagnosis present

## 2020-05-19 DIAGNOSIS — M545 Low back pain, unspecified: Secondary | ICD-10-CM | POA: Diagnosis present

## 2020-05-19 DIAGNOSIS — Z83438 Family history of other disorder of lipoprotein metabolism and other lipidemia: Secondary | ICD-10-CM

## 2020-05-19 DIAGNOSIS — Z79899 Other long term (current) drug therapy: Secondary | ICD-10-CM

## 2020-05-19 DIAGNOSIS — K259 Gastric ulcer, unspecified as acute or chronic, without hemorrhage or perforation: Secondary | ICD-10-CM | POA: Diagnosis present

## 2020-05-19 DIAGNOSIS — E669 Obesity, unspecified: Secondary | ICD-10-CM

## 2020-05-19 DIAGNOSIS — K529 Noninfective gastroenteritis and colitis, unspecified: Principal | ICD-10-CM | POA: Diagnosis present

## 2020-05-19 DIAGNOSIS — R1110 Vomiting, unspecified: Secondary | ICD-10-CM | POA: Diagnosis not present

## 2020-05-19 DIAGNOSIS — R0689 Other abnormalities of breathing: Secondary | ICD-10-CM | POA: Diagnosis not present

## 2020-05-19 DIAGNOSIS — F419 Anxiety disorder, unspecified: Secondary | ICD-10-CM | POA: Diagnosis present

## 2020-05-19 DIAGNOSIS — R112 Nausea with vomiting, unspecified: Secondary | ICD-10-CM | POA: Diagnosis not present

## 2020-05-19 DIAGNOSIS — R079 Chest pain, unspecified: Secondary | ICD-10-CM | POA: Diagnosis not present

## 2020-05-19 DIAGNOSIS — Z818 Family history of other mental and behavioral disorders: Secondary | ICD-10-CM

## 2020-05-19 DIAGNOSIS — R1012 Left upper quadrant pain: Secondary | ICD-10-CM | POA: Diagnosis not present

## 2020-05-19 DIAGNOSIS — F1211 Cannabis abuse, in remission: Secondary | ICD-10-CM | POA: Diagnosis present

## 2020-05-19 DIAGNOSIS — Z8711 Personal history of peptic ulcer disease: Secondary | ICD-10-CM

## 2020-05-19 DIAGNOSIS — R1010 Upper abdominal pain, unspecified: Secondary | ICD-10-CM

## 2020-05-19 DIAGNOSIS — R1084 Generalized abdominal pain: Secondary | ICD-10-CM | POA: Diagnosis not present

## 2020-05-19 DIAGNOSIS — R1013 Epigastric pain: Secondary | ICD-10-CM | POA: Diagnosis present

## 2020-05-19 DIAGNOSIS — R109 Unspecified abdominal pain: Secondary | ICD-10-CM | POA: Diagnosis not present

## 2020-05-19 DIAGNOSIS — K2100 Gastro-esophageal reflux disease with esophagitis, without bleeding: Secondary | ICD-10-CM | POA: Diagnosis present

## 2020-05-19 DIAGNOSIS — F329 Major depressive disorder, single episode, unspecified: Secondary | ICD-10-CM | POA: Diagnosis present

## 2020-05-19 DIAGNOSIS — Z20822 Contact with and (suspected) exposure to covid-19: Secondary | ICD-10-CM | POA: Diagnosis present

## 2020-05-19 DIAGNOSIS — F17210 Nicotine dependence, cigarettes, uncomplicated: Secondary | ICD-10-CM | POA: Diagnosis present

## 2020-05-19 DIAGNOSIS — K219 Gastro-esophageal reflux disease without esophagitis: Secondary | ICD-10-CM | POA: Diagnosis not present

## 2020-05-19 DIAGNOSIS — E860 Dehydration: Secondary | ICD-10-CM | POA: Diagnosis present

## 2020-05-19 DIAGNOSIS — K760 Fatty (change of) liver, not elsewhere classified: Secondary | ICD-10-CM | POA: Diagnosis present

## 2020-05-19 DIAGNOSIS — K2960 Other gastritis without bleeding: Secondary | ICD-10-CM | POA: Diagnosis present

## 2020-05-19 DIAGNOSIS — R000 Tachycardia, unspecified: Secondary | ICD-10-CM | POA: Diagnosis not present

## 2020-05-19 DIAGNOSIS — Z720 Tobacco use: Secondary | ICD-10-CM | POA: Diagnosis present

## 2020-05-19 DIAGNOSIS — Z833 Family history of diabetes mellitus: Secondary | ICD-10-CM

## 2020-05-19 DIAGNOSIS — D72823 Leukemoid reaction: Secondary | ICD-10-CM | POA: Diagnosis present

## 2020-05-19 LAB — CBC WITH DIFFERENTIAL/PLATELET
Abs Immature Granulocytes: 0.31 10*3/uL — ABNORMAL HIGH (ref 0.00–0.07)
Basophils Absolute: 0.2 10*3/uL — ABNORMAL HIGH (ref 0.0–0.1)
Basophils Relative: 1 %
Eosinophils Absolute: 0.1 10*3/uL (ref 0.0–0.5)
Eosinophils Relative: 1 %
HCT: 46.2 % — ABNORMAL HIGH (ref 36.0–46.0)
Hemoglobin: 15.9 g/dL — ABNORMAL HIGH (ref 12.0–15.0)
Immature Granulocytes: 2 %
Lymphocytes Relative: 20 %
Lymphs Abs: 4 10*3/uL (ref 0.7–4.0)
MCH: 31.2 pg (ref 26.0–34.0)
MCHC: 34.4 g/dL (ref 30.0–36.0)
MCV: 90.8 fL (ref 80.0–100.0)
Monocytes Absolute: 1.1 10*3/uL — ABNORMAL HIGH (ref 0.1–1.0)
Monocytes Relative: 6 %
Neutro Abs: 14.2 10*3/uL — ABNORMAL HIGH (ref 1.7–7.7)
Neutrophils Relative %: 70 %
Platelets: 347 10*3/uL (ref 150–400)
RBC: 5.09 MIL/uL (ref 3.87–5.11)
RDW: 12.3 % (ref 11.5–15.5)
WBC: 19.9 10*3/uL — ABNORMAL HIGH (ref 4.0–10.5)
nRBC: 0 % (ref 0.0–0.2)

## 2020-05-19 LAB — COMPREHENSIVE METABOLIC PANEL
ALT: 34 U/L (ref 0–44)
AST: 22 U/L (ref 15–41)
Albumin: 4.4 g/dL (ref 3.5–5.0)
Alkaline Phosphatase: 67 U/L (ref 38–126)
Anion gap: 14 (ref 5–15)
BUN: 14 mg/dL (ref 6–20)
CO2: 20 mmol/L — ABNORMAL LOW (ref 22–32)
Calcium: 10 mg/dL (ref 8.9–10.3)
Chloride: 103 mmol/L (ref 98–111)
Creatinine, Ser: 1.2 mg/dL — ABNORMAL HIGH (ref 0.44–1.00)
GFR calc Af Amer: >60 mL/min (ref >60)
GFR calc non Af Amer: >60 mL/min (ref >60)
Glucose, Bld: 132 mg/dL — ABNORMAL HIGH (ref 70–99)
Potassium: 2.9 mmol/L — ABNORMAL LOW (ref 3.5–5.1)
Sodium: 137 mmol/L (ref 135–145)
Total Bilirubin: 1 mg/dL (ref 0.3–1.2)
Total Protein: 7.4 g/dL (ref 6.5–8.1)

## 2020-05-19 LAB — LIPASE, BLOOD: Lipase: 21 U/L (ref 11–51)

## 2020-05-19 LAB — SARS CORONAVIRUS 2 BY RT PCR (HOSPITAL ORDER, PERFORMED IN ~~LOC~~ HOSPITAL LAB): SARS Coronavirus 2: NEGATIVE

## 2020-05-19 LAB — MAGNESIUM: Magnesium: 1.8 mg/dL (ref 1.7–2.4)

## 2020-05-19 MED ORDER — POTASSIUM CHLORIDE 10 MEQ/100ML IV SOLN
10.0000 meq | INTRAVENOUS | Status: AC
  Administered 2020-05-19 (×3): 10 meq via INTRAVENOUS
  Filled 2020-05-19 (×3): qty 100

## 2020-05-19 MED ORDER — HALOPERIDOL LACTATE 5 MG/ML IJ SOLN
2.0000 mg | Freq: Once | INTRAMUSCULAR | Status: AC
  Administered 2020-05-19: 2 mg via INTRAVENOUS
  Filled 2020-05-19: qty 1

## 2020-05-19 MED ORDER — METOCLOPRAMIDE HCL 5 MG/ML IJ SOLN
10.0000 mg | Freq: Four times a day (QID) | INTRAMUSCULAR | Status: DC
  Administered 2020-05-19 – 2020-05-20 (×2): 10 mg via INTRAVENOUS
  Filled 2020-05-19 (×3): qty 2

## 2020-05-19 MED ORDER — ONDANSETRON HCL 4 MG/2ML IJ SOLN
4.0000 mg | Freq: Four times a day (QID) | INTRAMUSCULAR | Status: DC | PRN
  Administered 2020-05-19 – 2020-05-22 (×4): 4 mg via INTRAVENOUS
  Filled 2020-05-19 (×4): qty 2

## 2020-05-19 MED ORDER — HYDROXYZINE HCL 25 MG PO TABS
25.0000 mg | ORAL_TABLET | Freq: Four times a day (QID) | ORAL | Status: DC
  Administered 2020-05-19 – 2020-05-21 (×9): 25 mg via ORAL
  Filled 2020-05-19 (×11): qty 1

## 2020-05-19 MED ORDER — SODIUM CHLORIDE 0.9 % IV SOLN: INTRAVENOUS | Status: DC

## 2020-05-19 MED ORDER — BUSPIRONE HCL 5 MG PO TABS
15.0000 mg | ORAL_TABLET | Freq: Two times a day (BID) | ORAL | Status: DC
  Administered 2020-05-19 – 2020-05-22 (×6): 15 mg via ORAL
  Filled 2020-05-19 (×6): qty 3

## 2020-05-19 MED ORDER — ACETAMINOPHEN 500 MG PO TABS
1000.0000 mg | ORAL_TABLET | Freq: Once | ORAL | Status: DC
  Filled 2020-05-19: qty 2

## 2020-05-19 MED ORDER — PANTOPRAZOLE SODIUM 40 MG IV SOLR
40.0000 mg | Freq: Two times a day (BID) | INTRAVENOUS | Status: DC
  Administered 2020-05-19 – 2020-05-22 (×6): 40 mg via INTRAVENOUS
  Filled 2020-05-19 (×6): qty 40

## 2020-05-19 MED ORDER — SODIUM CHLORIDE 0.9 % IV BOLUS
1000.0000 mL | Freq: Once | INTRAVENOUS | Status: AC
  Administered 2020-05-19: 1000 mL via INTRAVENOUS

## 2020-05-19 MED ORDER — TRAZODONE HCL 50 MG PO TABS
50.0000 mg | ORAL_TABLET | Freq: Every day | ORAL | Status: DC
  Administered 2020-05-19 – 2020-05-21 (×3): 50 mg via ORAL
  Filled 2020-05-19 (×3): qty 1

## 2020-05-19 MED ORDER — POTASSIUM CHLORIDE CRYS ER 20 MEQ PO TBCR
40.0000 meq | EXTENDED_RELEASE_TABLET | Freq: Once | ORAL | Status: DC
  Filled 2020-05-19: qty 2

## 2020-05-19 MED ORDER — PAROXETINE HCL 20 MG PO TABS
40.0000 mg | ORAL_TABLET | Freq: Every day | ORAL | Status: DC
  Administered 2020-05-20 – 2020-05-22 (×3): 40 mg via ORAL
  Filled 2020-05-19 (×3): qty 2

## 2020-05-19 MED ORDER — POTASSIUM CHLORIDE 10 MEQ/100ML IV SOLN
10.0000 meq | Freq: Once | INTRAVENOUS | Status: AC
  Administered 2020-05-19: 10 meq via INTRAVENOUS
  Filled 2020-05-19: qty 100

## 2020-05-19 MED ORDER — ONDANSETRON HCL 4 MG/2ML IJ SOLN
4.0000 mg | Freq: Once | INTRAMUSCULAR | Status: AC
  Administered 2020-05-19: 4 mg via INTRAVENOUS
  Filled 2020-05-19: qty 2

## 2020-05-19 MED ORDER — LORAZEPAM 2 MG/ML IJ SOLN
1.0000 mg | Freq: Once | INTRAMUSCULAR | Status: AC
  Administered 2020-05-19: 1 mg via INTRAVENOUS
  Filled 2020-05-19: qty 1

## 2020-05-19 MED ORDER — HYDROMORPHONE HCL 1 MG/ML IJ SOLN
0.5000 mg | INTRAMUSCULAR | Status: DC | PRN
  Administered 2020-05-19 – 2020-05-22 (×18): 0.5 mg via INTRAVENOUS
  Filled 2020-05-19 (×19): qty 0.5

## 2020-05-19 MED ORDER — DICYCLOMINE HCL 10 MG PO CAPS
10.0000 mg | ORAL_CAPSULE | Freq: Three times a day (TID) | ORAL | Status: DC
  Administered 2020-05-19 – 2020-05-22 (×10): 10 mg via ORAL
  Filled 2020-05-19 (×11): qty 1

## 2020-05-19 MED ORDER — ETONOGESTREL 68 MG ~~LOC~~ IMPL: 1.0000 | DRUG_IMPLANT | Freq: Once | SUBCUTANEOUS | Status: DC

## 2020-05-19 NOTE — ED Notes (Signed)
Family requesting a specialist to come to ED to evaluate pt since her appt isn't until Sept 23rd with Dr. Maylon Peppers. Family feels as if they have been coming here everyday without someone taking them seriously. Dr. Darlyn Chamber of concerns. Pt is laying comfortably without any grimacing , moaning, or tensioning any body muscles due to pain . Pt continues to request pain meds other than tylenol.

## 2020-05-19 NOTE — ED Triage Notes (Signed)
Pt presents to ED with complaint of mid recurrent abdominal pain which continues. Pt brought by [facility] and received zofran 4 mg PTA.

## 2020-05-19 NOTE — ED Provider Notes (Signed)
Shared service with APP.  I have personally seen and examined the patient, providing direct face to face care.  Physical exam findings and plan include patient presents with worsening abdominal pain and vomiting.  Patient has a history of ulcers however is unsure if she is ever had EGD, gastritis history as well.  Patient had CT scan done yesterday results reviewed no acute abnormalities.  Patient had minimally elevated lipase.  Patient has epigastric discomfort on exam no guarding.  No fever.  Plan for repeat blood work, nausea meds, pain meds and likely outpatient follow-up with gastroenterology.  Patient does not have a gallbladder.  Patient does have chronic marijuana use history.  No diagnosis found.     Elnora Morrison, MD 05/23/20 [phone]

## 2020-05-19 NOTE — H&P (Signed)
History and Physical  Debbie Santana ZYY:[id number] DOB: 1991-07-31 DOA: 05/19/2020  Referring physician: Landis Martins, EDP PCP: Patient, No Pcp Per  Outpatient Specialists:   Patient Coming From: home  Chief Complaint: epigastric pain  HPI: Debbie Santana is a 29 y.o. female with a history of GERD, peptic ulcer disease, chronic back pain, anxiety.  Patient seen for acute onset of epigastric and left upper quadrant pain that started on Monday.  Patient has been seen 3 times in the emergency department since that time.  She has had a fairly extensive work-up including a CT of her abdomen pelvis, chest x-ray, blood work.  Everything has been fairly conclusive.  Pain improved with pain medicine but worsened by food, liquid.  She does have nausea and vomiting.  Emesis described as food contents, bile.  She does report fairly heavy marijuana use and quit last week.  Emergency Department Course: Labs show hypokalemia.  Chest x-ray normal.  CT from yesterday fairly normal.  She did have a elevated lipase yesterday which was improved today.  GI was consulted, recommended hospital admission and possible EGD   Review of Systems:   Pt denies any fevers, chills, diarrhea, constipation, shortness of breath, dyspnea on exertion, orthopnea, cough, wheezing, palpitations, headache, vision changes, lightheadedness, dizziness, melena, rectal bleeding.  Review of systems are otherwise negative  Past Medical History:  Diagnosis Date   Anxiety    Bradycardia 04/17/2017   Chronic back pain    Erosive gastritis 04/11/2017   Gastric ulcer    Genital warts    History of kidney stones    Mental disorder    Peptic ulcer disease 04/13/2017   Sciatica    Vaginal Pap smear, abnormal    Past Surgical History:  Procedure Laterality Date   BACK SURGERY     ruptured disc.   BIOPSY  04/14/2017   Procedure: BIOPSY;  Surgeon: Rogene Houston, MD;  Location: [facility];  Service: Endoscopy;;   gastric   CERVICAL ABLATION N/A 12/02/2018   Procedure: LASER ABLATION OF CERVIX;  Surgeon: Florian Buff, MD;  Location: [facility];  Service: Gynecology;  Laterality: N/A;   CHOLECYSTECTOMY N/A 04/17/2017   Procedure: LAPAROSCOPIC CHOLECYSTECTOMY;  Surgeon: Aviva Signs, MD;  Location: [facility];  Service: General;  Laterality: N/A;   COLPOSCOPY     ESOPHAGOGASTRODUODENOSCOPY (EGD) WITH PROPOFOL N/A 04/14/2017   Procedure: ESOPHAGOGASTRODUODENOSCOPY (EGD) WITH PROPOFOL;  Surgeon: Rogene Houston, MD;  Location: [facility];  Service: Endoscopy;  Laterality: N/A;   HERNIA REPAIR Bilateral    inguinal   LASER ABLATION CONDOLAMATA N/A 12/02/2018   Procedure: LASER ABLATION CONDYLOMA ACCUMINATA LEFT AND RIGHT VULVA, PERINEUM AND PERIANAL (15 TOTAL);  Surgeon: Florian Buff, MD;  Location: [facility];  Service: Gynecology;  Laterality: N/A;   Social History:  reports that she has been smoking cigarettes. She has a 2.50 pack-year smoking history. She has never used smokeless tobacco. She reports that she does not drink alcohol and does not use drugs. Patient lives at home  No Known Allergies  Family History  Problem Relation Age of Onset   Anxiety disorder Mother    Hyperlipidemia Mother    Crohn's disease Sister    Diabetes Maternal Grandfather    Diabetes Cousin    Learning disabilities Cousin       Prior to Admission medications   Medication Sig Start Date End Date Taking? Authorizing Provider  busPIRone (BUSPAR) 7.5 MG tablet Take 2 tablets by mouth  twice daily 05/02/20  Yes Derrek Monaco A, NP  dicyclomine (BENTYL) 10 MG capsule Take 1 capsule (10 mg total) by mouth 4 (four) times daily -  before meals and at bedtime. 05/10/20  Yes Estill Dooms, NP  etonogestrel (NEXPLANON) 68 MG IMPL implant 1 each by Subdermal route once.   Yes [provider]  hydrOXYzine (ATARAX/VISTARIL) 25 MG tablet Take 1 tablet (25 mg total) by mouth every 6 (six) hours. 05/11/20  Yes  Estill Dooms, NP  omeprazole (PRILOSEC) 20 MG capsule Take 1 capsule (20 mg total) by mouth daily. 05/17/20  Yes Rolland Porter, MD  ondansetron (ZOFRAN ODT) 4 MG disintegrating tablet Take 1 tablet (4 mg total) by mouth every 8 (eight) hours as needed for nausea or vomiting. 05/18/20  Yes Idol, Almyra Free, PA-C  PARoxetine (PAXIL) 40 MG tablet Take 1 tablet (40 mg total) by mouth every morning. 05/10/20  Yes Derrek Monaco A, NP  potassium chloride SA (KLOR-CON) 20 MEQ tablet Take 1 tablet (20 mEq total) by mouth 2 (two) times daily. 05/17/20  Yes Rolland Porter, MD  promethazine (PHENERGAN) 25 MG tablet TAKE 1 TABLET BY MOUTH EVERY 6 HOURS AS NEEDED FOR NAUSEA AND VOMITING Patient taking differently: Take 25 mg by mouth every 6 (six) hours as needed for nausea or vomiting.  04/24/20  Yes Derrek Monaco A, NP  rizatriptan (MAXALT) 10 MG tablet TAKE 1 TABLET BY MOUTH ONCE DAILY AS NEEDED FOR  MIGRAINE,  MAY  REPEAT  IN  2  HOURS  IF  NEEDED. 01/31/20  Yes Estill Dooms, NP  traZODone (DESYREL) 50 MG tablet TAKE 1 TABLET BY MOUTH AT BEDTIME 05/11/20  Yes Derrek Monaco A, NP  fluocinonide-emollient (LIDEX-E) 0.05 % cream Apply 1 application topically 2 (two) times daily. Patient not taking: Reported on 05/18/2020 01/27/20   Florian Buff, MD  ondansetron (ZOFRAN) 4 MG tablet Take 1 tablet (4 mg total) by mouth every 6 (six) hours. 05/17/20   Rolland Porter, MD    Physical Exam: BP 133/77    Pulse (!) 54    Temp (!) 97.4 F (36.3 C) (Oral)    Resp 20    Ht 5\' 8"  (1.727 m)    Wt 113.4 kg    SpO2 98%    BMI 38.01 kg/m    General: Young female. Awake and alert and oriented x3. No acute cardiopulmonary distress.   HEENT: Normocephalic atraumatic.  Right and left ears normal in appearance.  Pupils equal, round, reactive to light. Extraocular muscles are intact. Sclerae anicteric and noninjected.  Moist mucosal membranes. No mucosal lesions.   Neck: Neck supple without lymphadenopathy. No carotid bruits.  No masses palpated.   Cardiovascular: Regular rate with normal S1-S2 sounds. No murmurs, rubs, gallops auscultated. No JVD.   Respiratory: Good respiratory effort with no wheezes, rales, rhonchi. Lungs clear to auscultation bilaterally.  No accessory muscle use.  Abdomen: Soft, tender in the epigastric and left upper quadrant regions.  No rebound or guarding. Nondistended. Active bowel sounds. No masses or hepatosplenomegaly   Skin: No rashes, lesions, or ulcerations.  Dry, warm to touch. 2+ dorsalis pedis and radial pulses.  Musculoskeletal: No calf or leg pain. All major joints not erythematous nontender.  No upper or lower joint deformation.  Good ROM.  No contractures   Psychiatric: Intact judgment and insight. Pleasant and cooperative.  Neurologic: No focal neurological deficits. Strength is 5/5 and symmetric in upper and lower extremities.  Cranial nerves II through  XII are grossly intact.           Labs on Admission: I have personally reviewed following labs and imaging studies  CBC: Recent Labs  Lab 05/17/20 0128 05/17/20 0854 05/18/20 1012 05/19/20 1406  WBC 23.4* 16.1* 16.2* 19.9*  NEUTROABS  --   --   --  14.2*  HGB 15.7* 14.7 14.8 15.9*  HCT 45.9 42.6 42.6 46.2*  MCV 91.1 92.4 91.8 90.8  PLT 314 256 277 295   Basic Metabolic Panel: Recent Labs  Lab 05/17/20 0128 05/18/20 1012 05/19/20 1406  NA 138 134* 137  K 2.9* 3.1* 2.9*  CL 102 102 103  CO2 21* 22 20*  GLUCOSE 130* 107* 132*  BUN 16 13 14   CREATININE 1.07* 0.91 1.20*  CALCIUM 9.9 9.3 10.0   GFR: Estimated Creatinine Clearance: 91.4 mL/min (A) (by C-G formula based on SCr of 1.2 mg/dL (H)). Liver Function Tests: Recent Labs  Lab 05/17/20 0128 05/18/20 1012 05/19/20 1406  AST 17 22 22   ALT 18 26 34  ALKPHOS 66 62 67  BILITOT 1.1 1.2 1.0  PROT 8.2* 7.3 7.4  ALBUMIN 4.8 4.4 4.4   Recent Labs  Lab 05/17/20 0128 05/18/20 1012 05/19/20 1406  LIPASE 24 88* 21   No results for input(s):  AMMONIA in the last 168 hours. Coagulation Profile: No results for input(s): INR, PROTIME in the last 168 hours. Cardiac Enzymes: No results for input(s): CKTOTAL, CKMB, CKMBINDEX, TROPONINI in the last 168 hours. BNP (last 3 results) No results for input(s): PROBNP in the last 8760 hours. HbA1C: No results for input(s): HGBA1C in the last 72 hours. CBG: No results for input(s): GLUCAP in the last 168 hours. Lipid Profile: No results for input(s): CHOL, HDL, LDLCALC, TRIG, CHOLHDL, LDLDIRECT in the last 72 hours. Thyroid Function Tests: No results for input(s): TSH, T4TOTAL, FREET4, T3FREE, THYROIDAB in the last 72 hours. Anemia Panel: No results for input(s): VITAMINB12, FOLATE, FERRITIN, TIBC, IRON, RETICCTPCT in the last 72 hours. Urine analysis:    Component Value Date/Time   COLORURINE YELLOW 05/18/2020 0934   APPEARANCEUR HAZY (A) 05/18/2020 0934   APPEARANCEUR Clear 11/04/2017 1159   LABSPEC 1.015 05/18/2020 0934   PHURINE 6.0 05/18/2020 0934   GLUCOSEU NEGATIVE 05/18/2020 0934   HGBUR NEGATIVE 05/18/2020 0934   BILIRUBINUR NEGATIVE 05/18/2020 0934   BILIRUBINUR Negative 11/04/2017 1159   KETONESUR 5 (A) 05/18/2020 0934   PROTEINUR 30 (A) 05/18/2020 0934   UROBILINOGEN 0.2 05/14/2015 0028   NITRITE NEGATIVE 05/18/2020 0934   LEUKOCYTESUR NEGATIVE 05/18/2020 0934   Sepsis Labs: @LABRCNTIP (procalcitonin:4,lacticidven:4) ) Recent Results (from the past 240 hour(s))  SARS Coronavirus 2 by RT PCR (hospital order, performed in [facility] hospital lab) Nasopharyngeal Nasopharyngeal Swab     Status: None   Collection Time: 05/17/20  3:23 AM   Specimen: Nasopharyngeal Swab  Result Value Ref Range Status   SARS Coronavirus 2 NEGATIVE NEGATIVE Final    Comment: (NOTE) SARS-CoV-2 target nucleic acids are NOT DETECTED.  The SARS-CoV-2 RNA is generally detectable in upper and lower respiratory specimens during the acute phase of infection. The lowest concentration of  SARS-CoV-2 viral copies this assay can detect is 250 copies / mL. A negative result does not preclude SARS-CoV-2 infection and should not be used as the sole basis for treatment or other patient management decisions.  A negative result may occur with improper specimen collection / handling, submission of specimen other than nasopharyngeal swab, presence of viral mutation(s) within the  areas targeted by this assay, and inadequate number of viral copies (<250 copies / mL). A negative result must be combined with clinical observations, patient history, and epidemiological information.  Fact Sheet for Patients:   StrictlyIdeas.no  Fact Sheet for Healthcare Providers: BankingDealers.co.za  This test is not yet approved or  cleared by the [place] FDA and has been authorized for detection and/or diagnosis of SARS-CoV-2 by FDA under an Emergency Use Authorization (EUA).  This EUA will remain in effect (meaning this test can be used) for the duration of the COVID-19 declaration under Section 564(b)(1) of the Act, 21 U.S.C. section 360bbb-3(b)(1), unless the authorization is terminated or revoked sooner.  Performed at [facility], [address], [place], [place] [address]   Urine culture     Status: Abnormal   Collection Time: 05/17/20  8:38 AM   Specimen: Urine, Random  Result Value Ref Range Status   Specimen Description   Final    URINE, RANDOM Performed at [facility], [address], [place], [place] [address]    Special Requests   Final    NONE Performed at [facility], [address], [place], [place] [address]    Culture MULTIPLE SPECIES PRESENT, SUGGEST RECOLLECTION (A)  Final   Report Status 05/18/2020 FINAL  Final     Radiological Exams on Admission: CT ABDOMEN PELVIS W CONTRAST  Result Date: 05/18/2020 CLINICAL DATA:  Left-sided abdominal pain with nausea and vomiting EXAM: CT ABDOMEN AND PELVIS WITH CONTRAST  TECHNIQUE: Multidetector CT imaging of the abdomen and pelvis was performed using the standard protocol following bolus administration of intravenous contrast. CONTRAST:  171mL OMNIPAQUE IOHEXOL 300 MG/ML  SOLN COMPARISON:  April 26, 2020 FINDINGS: Lower chest: There is mild atelectatic change in the right base. Lung bases otherwise are clear. Hepatobiliary: There is mild fatty infiltration near the fissure for the ligamentum teres. Beyond mild fatty infiltration, no focal liver lesions are evident. The gallbladder wall is not appreciably thickened. There is no intrahepatic biliary duct dilatation. The common hepatic duct measures 10 mm in diameter, upper normal for post cholecystectomy state. No obstructing lesion is seen by CT in the biliary ductal system. Pancreas: There is no appreciable pancreatic mass or inflammatory focus. Spleen: No splenic lesions are evident. Adrenals/Urinary Tract: Adrenals bilaterally appear normal. Kidneys bilaterally show no evident mass or hydronephrosis on either side. There is no appreciable renal or ureteral calculus on either side. Urinary bladder is midline with wall thickness within normal limits. Stomach/Bowel: There is no appreciable bowel wall or mesenteric thickening. The terminal ileum appears normal. There is a degree of fatty infiltration in the ileocecal valve. There is no evident bowel obstruction. There is no appreciable free air or portal venous air. Vascular/Lymphatic: There is no abdominal aortic aneurysm. No arterial vascular lesions are appreciable. Major venous structures appear patent. No evident adenopathy in the abdomen or pelvis. Reproductive: The uterus is anteverted. There is an apparent dominant follicle in the right ovary measuring 2.2 x 1.9 cm. There is apparent cystic change in each vaginal labium. An apparent cystic area arising in the right vaginal labium measures 2.3 x 1.3 cm. A similar appearing area on the left measures 1.7 x 1.3 cm. The cystic  areas presumably represent Bartholin's gland cysts. Other: The appendix appears normal. There is no evident abscess or ascites in the abdomen or pelvis. Musculoskeletal: No blastic or lytic bone lesions. There is no intramuscular or abdominal wall lesion evident. IMPRESSION: 1. A cause for patient's symptoms  has not been established with this study. 2. No bowel wall thickening or bowel obstruction. No abscess in the abdomen or pelvis. Appendix appears normal. 3. Stable apparent bilateral Bartholin's gland cysts in the vaginal labia. 4. No renal or ureteral calculi. No hydronephrosis. Urinary bladder wall thickness normal. 5. Gallbladder absent. Common hepatic duct upper normal in size. No biliary ductal obstruction evident by CT. Electronically Signed   By: Lowella Grip III M.D.   On: 05/18/2020 16:49   DG Chest Portable 1 View  Result Date: 05/19/2020 CLINICAL DATA:  Upper abdominal pain and vomiting. EXAM: PORTABLE CHEST 1 VIEW COMPARISON:  May 17, 2020 FINDINGS: There is no evidence of acute infiltrate, pleural effusion or pneumothorax. The heart size and mediastinal contours are within normal limits. The visualized skeletal structures are unremarkable. IMPRESSION: No active disease. Electronically Signed   By: Virgina Norfolk M.D.   On: 05/19/2020 15:14    EKG: Independently reviewed.  Sinus rhythm with no ST changes  Assessment/Plan: Principal Problem:   Epigastric pain Active Problems:   Leukocytosis   Tobacco abuse   Nausea & vomiting   Low back pain   Gastroesophageal reflux disease   Marijuana abuse in remission   Hypokalemia    This patient was discussed with the ED physician, including pertinent vitals, physical exam findings, labs, and imaging.  We also discussed care given by the ED provider.  1. Epigastric pain with nausea and vomiting a. Clear liquid Needs with n.p.o. after midnight b. GI consulted c. Gastric protection with Protonix d. Reglan e. We will check  hemoglobin A1c as her serum glucose was slightly elevated f. IV fluids 2. Leukocytosis a. Uncertain of etiology.  Patient not on steroids b. Recheck in the morning 3. Hypokalemia a. Replace potassium and recheck in the morning 4. Marijuana abuse a. Possibly marijuana withdrawal 5. GERD a. On PPI 6. Chronic back pain  DVT prophylaxis: SCDs as patient getting EGD tomorrow Consultants: GI Code Status: Full code Family Communication: Mother present Disposition Plan: Patient should be able to return home following   Truett Mainland, DO

## 2020-05-19 NOTE — ED Provider Notes (Signed)
[facility] EMERGENCY DEPARTMENT Provider Note   CSN: [id number] Arrival date & time: 05/19/20  1349     History Chief Complaint  Patient presents with  . Abdominal Pain    Debbie Santana is a 29 y.o. female history of anxiety with panic, erosive gastritis with peptic ulcer disease, and prior history of intractable abdominal pain presenting for reevaluation after being seen here yesterday for sharp, nearly constant upper abdominal pain which radiates into her back and is associated with nausea and vomiting.  She was seen here yesterday by me at which time lab tests were revealing for an elevated WBC count which has been chronic and also an elevation of her lipase.  CT imaging was negative for any acute source of her symptoms, specifically no obvious acute pancreatitis.  She has had a cholecystectomy.  She is scheduled to see gastroenterology on September 23 for evaluation of her complaints, was referred by her gynecologist.  She states she slept well last night, her symptoms began when she woke today.  She endorses increased anxiety which she feels is triggered by the pain, not vice versa.  She denies fevers or chills.  She was prescribed Zofran ODT last night which was helpful until this morning.  Pt has a history of marijuana use, last smoked 1 week ago. No diagnosis of cannabis hyperemesis.   HPI     Past Medical History:  Diagnosis Date  . Anxiety   . Bradycardia 04/17/2017  . Chronic back pain   . 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 Gastric ulcer   . Genital warts   . History of kidney stones   . Mental disorder   . Peptic ulcer disease 04/13/2017  . Sciatica   . Vaginal Pap smear, abnormal     Patient Active Problem List   Diagnosis Date Noted  . Panic attack 05/18/2020  . Epigastric pain 05/18/2020  . Agitated 05/18/2020  . Gastroesophageal reflux disease 05/10/2020  . Alternating constipation and diarrhea 05/10/2020  . Nausea 05/10/2020  . Wheezing on inspiration  02/22/2020  . Chest pain on breathing 02/22/2020  . Sleep disturbance 02/22/2020  . Intertrigo 11/16/2019  . Yeast infection of the skin 09/01/2019  . Migraine without aura and without status migrainosus, not intractable 02/19/2019  . Anxiety and depression 02/19/2019  . Nexplanon in place 02/19/2019  . Irregular intermenstrual bleeding 02/19/2019  . Nausea & vomiting 12/07/2018  . Low back pain 12/07/2018  . Gastroenteritis 12/07/2018  . Acute lower UTI 12/07/2018  . Dehydration 12/07/2018  . 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 History of gestational hypertension 05/21/2018  . Condyloma 04/15/2018  . History of abnormal cervical Pap smear 11/04/2017  . Cigarette smoker 04/26/2017  . Reflux esophagitis 04/17/2017  . Obesity 04/17/2017  . Tobacco abuse 04/17/2017  . Peptic ulcer disease 04/13/2017  . 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 Intractable abdominal pain 04/11/2017  . Leukocytosis 04/11/2017  . Hyperglycemia 04/11/2017  . Anxiety 04/11/2017    Past Surgical History:  Procedure Laterality Date  . BACK SURGERY     ruptured disc.  . BIOPSY  04/14/2017   Procedure: BIOPSY;  Surgeon: Rogene Houston, MD;  Location: [facility];  Service: Endoscopy;;  gastric  . CERVICAL ABLATION N/A 12/02/2018   Procedure: LASER ABLATION OF CERVIX;  Surgeon: Florian Buff, MD;  Location: [facility] ORS;  Service: Gynecology;  Laterality: N/A;  . CHOLECYSTECTOMY N/A 04/17/2017   Procedure: LAPAROSCOPIC CHOLECYSTECTOMY;  Surgeon: Aviva Signs, MD;  Location: [facility] OR[facility];  Service:  General;  Laterality: N/A;  . COLPOSCOPY    . ESOPHAGOGASTRODUODENOSCOPY (EGD) WITH PROPOFOL N/A 04/14/2017   Procedure: ESOPHAGOGASTRODUODENOSCOPY (EGD) WITH PROPOFOL;  Surgeon: Rogene Houston, MD;  Location: [facility];  Service: Endoscopy;  Laterality: N/A;  . HERNIA REPAIR Bilateral    inguinal  . LASER ABLATION CONDOLAMATA N/A 12/02/2018   Procedure: LASER ABLATION CONDYLOMA ACCUMINATA LEFT AND RIGHT VULVA, PERINEUM  AND PERIANAL (15 TOTAL);  Surgeon: Florian Buff, MD;  Location: [facility];  Service: Gynecology;  Laterality: N/A;     OB History    Gravida  2   Para  1   Term  1   Preterm      AB  1   Living  1     SAB  1   TAB      Ectopic      Multiple  0   Live Births  1           Family History  Problem Relation Age of Onset  . Anxiety disorder Mother   . Hyperlipidemia Mother   . Crohn's disease Sister   . Diabetes Maternal Grandfather   . Diabetes Cousin   . Learning disabilities Cousin     Social History   Tobacco Use  . Smoking status: Current Every Day Smoker    Packs/day: 0.50    Years: 5.00    Pack years: 2.50    Types: Cigarettes  . Smokeless tobacco: Never Used  Vaping Use  . Vaping Use: Never used  Substance Use Topics  . Alcohol use: No  . Drug use: No    Home Medications Prior to Admission medications   Medication Sig Start Date End Date Taking? Authorizing Provider  busPIRone (BUSPAR) 15 MG tablet Take 1 tablet (15 mg total) by mouth 3 (three) times daily. Patient not taking: Reported on 05/17/2020 03/14/20   Estill Dooms, NP  busPIRone (BUSPAR) 7.5 MG tablet Take 2 tablets by mouth twice daily Patient not taking: Reported on 05/18/2020 05/02/20   Estill Dooms, NP  cyclobenzaprine (FLEXERIL) 10 MG tablet TAKE 1 TABLET BY MOUTH TWICE DAILY AS NEEDED FOR MUSCLE SPASM Patient not taking: Reported on 05/10/2020 01/31/20   Estill Dooms, NP  dicyclomine (BENTYL) 10 MG capsule Take 1 capsule (10 mg total) by mouth 4 (four) times daily -  before meals and at bedtime. 05/10/20   Estill Dooms, NP  etonogestrel (NEXPLANON) 68 MG IMPL implant 1 each by Subdermal route once.    [provider]  fluocinonide-emollient (LIDEX-E) 0.05 % cream Apply 1 application topically 2 (two) times daily. Patient not taking: Reported on 05/18/2020 01/27/20   Florian Buff, MD  hydrOXYzine (ATARAX/VISTARIL) 25 MG tablet Take 1 tablet (25 mg  total) by mouth every 6 (six) hours. 05/11/20   Estill Dooms, NP  omeprazole (PRILOSEC) 20 MG capsule Take 1 capsule (20 mg total) by mouth daily. 05/17/20   Rolland Porter, MD  ondansetron (ZOFRAN ODT) 4 MG disintegrating tablet Take 1 tablet (4 mg total) by mouth every 8 (eight) hours as needed for nausea or vomiting. 05/18/20   Serrina Minogue, Almyra Free, PA-C  ondansetron (ZOFRAN) 4 MG tablet Take 1 tablet (4 mg total) by mouth every 6 (six) hours. 05/17/20   Rolland Porter, MD  PARoxetine (PAXIL) 40 MG tablet Take 1 tablet (40 mg total) by mouth every morning. 05/10/20   Estill Dooms, NP  potassium chloride SA (KLOR-CON) 20 MEQ tablet Take 1 tablet (  20 mEq total) by mouth 2 (two) times daily. 05/17/20   Rolland Porter, MD  promethazine (PHENERGAN) 25 MG tablet TAKE 1 TABLET BY MOUTH EVERY 6 HOURS AS NEEDED FOR NAUSEA AND VOMITING 04/24/20   Derrek Monaco A, NP  rizatriptan (MAXALT) 10 MG tablet TAKE 1 TABLET BY MOUTH ONCE DAILY AS NEEDED FOR  MIGRAINE,  MAY  REPEAT  IN  2  HOURS  IF  NEEDED. 01/31/20   Estill Dooms, NP  traZODone (DESYREL) 50 MG tablet TAKE 1 TABLET BY MOUTH AT BEDTIME 05/11/20   Estill Dooms, NP    Allergies    Patient has no known allergies.  Review of Systems   Review of Systems  Constitutional: Negative for fever.  HENT: Negative for congestion and sore throat.   Eyes: Negative.   Respiratory: Negative for chest tightness and shortness of breath.   Cardiovascular: Negative for chest pain.  Gastrointestinal: Positive for abdominal pain, nausea and vomiting.  Genitourinary: Negative.   Musculoskeletal: Negative for arthralgias, joint swelling and neck pain.  Skin: Negative.  Negative for rash and wound.  Neurological: Negative for dizziness, weakness, light-headedness, numbness and headaches.  Psychiatric/Behavioral: The patient is nervous/anxious.     Physical Exam Updated Vital Signs BP 133/77   Pulse (!) 54   Temp (!) 97.4 F (36.3 C) (Oral)   Resp 20   Ht 5'  8" (1.727 m)   Wt 113.4 kg   SpO2 98%   BMI 38.01 kg/m   Physical Exam Vitals and nursing note reviewed.  Constitutional:      Appearance: She is well-developed.  HENT:     Head: Normocephalic and atraumatic.  Eyes:     Conjunctiva/sclera: Conjunctivae normal.  Cardiovascular:     Rate and Rhythm: Normal rate and regular rhythm.     Heart sounds: Normal heart sounds.  Pulmonary:     Effort: Pulmonary effort is normal.     Breath sounds: Normal breath sounds. No wheezing.  Abdominal:     General: Abdomen is protuberant. Bowel sounds are normal.     Palpations: Abdomen is soft.     Tenderness: There is abdominal tenderness in the epigastric area and left upper quadrant.  Musculoskeletal:        General: Normal range of motion.     Cervical back: Normal range of motion.  Skin:    General: Skin is warm and dry.  Neurological:     Mental Status: She is alert.     ED Results / Procedures / Treatments   Labs (all labs ordered are listed, but only abnormal results are displayed) Labs Reviewed  CBC WITH DIFFERENTIAL/PLATELET - Abnormal; Notable for the following components:      Result Value   WBC 19.9 (*)    Hemoglobin 15.9 (*)    HCT 46.2 (*)    Neutro Abs 14.2 (*)    Monocytes Absolute 1.1 (*)    Basophils Absolute 0.2 (*)    Abs Immature Granulocytes 0.31 (*)    All other components within normal limits  COMPREHENSIVE METABOLIC PANEL - Abnormal; Notable for the following components:   Potassium 2.9 (*)    CO2 20 (*)    Glucose, Bld 132 (*)    Creatinine, Ser 1.20 (*)    All other components within normal limits  SARS CORONAVIRUS 2 BY RT PCR ([facility])  LIPASE, BLOOD    EKG None  Radiology CT ABDOMEN PELVIS W CONTRAST  Result Date: 05/18/2020 CLINICAL DATA:  Left-sided abdominal pain with nausea and vomiting EXAM: CT ABDOMEN AND PELVIS WITH CONTRAST TECHNIQUE: Multidetector CT imaging of the abdomen and pelvis was  performed using the standard protocol following bolus administration of intravenous contrast. CONTRAST:  156mL OMNIPAQUE IOHEXOL 300 MG/ML  SOLN COMPARISON:  April 26, 2020 FINDINGS: Lower chest: There is mild atelectatic change in the right base. Lung bases otherwise are clear. Hepatobiliary: There is mild fatty infiltration near the fissure for the ligamentum teres. Beyond mild fatty infiltration, no focal liver lesions are evident. The gallbladder wall is not appreciably thickened. There is no intrahepatic biliary duct dilatation. The common hepatic duct measures 10 mm in diameter, upper normal for post cholecystectomy state. No obstructing lesion is seen by CT in the biliary ductal system. Pancreas: There is no appreciable pancreatic mass or inflammatory focus. Spleen: No splenic lesions are evident. Adrenals/Urinary Tract: Adrenals bilaterally appear normal. Kidneys bilaterally show no evident mass or hydronephrosis on either side. There is no appreciable renal or ureteral calculus on either side. Urinary bladder is midline with wall thickness within normal limits. Stomach/Bowel: There is no appreciable bowel wall or mesenteric thickening. The terminal ileum appears normal. There is a degree of fatty infiltration in the ileocecal valve. There is no evident bowel obstruction. There is no appreciable free air or portal venous air. Vascular/Lymphatic: There is no abdominal aortic aneurysm. No arterial vascular lesions are appreciable. Major venous structures appear patent. No evident adenopathy in the abdomen or pelvis. Reproductive: The uterus is anteverted. There is an apparent dominant follicle in the right ovary measuring 2.2 x 1.9 cm. There is apparent cystic change in each vaginal labium. An apparent cystic area arising in the right vaginal labium measures 2.3 x 1.3 cm. A similar appearing area on the left measures 1.7 x 1.3 cm. The cystic areas presumably represent Bartholin's gland cysts. Other: The  appendix appears normal. There is no evident abscess or ascites in the abdomen or pelvis. Musculoskeletal: No blastic or lytic bone lesions. There is no intramuscular or abdominal wall lesion evident. IMPRESSION: 1. A cause for patient's symptoms has not been established with this study. 2. No bowel wall thickening or bowel obstruction. No abscess in the abdomen or pelvis. Appendix appears normal. 3. Stable apparent bilateral Bartholin's gland cysts in the vaginal labia. 4. No renal or ureteral calculi. No hydronephrosis. Urinary bladder wall thickness normal. 5. Gallbladder absent. Common hepatic duct upper normal in size. No biliary ductal obstruction evident by CT. Electronically Signed   By: Lowella Grip III M.D.   On: 05/18/2020 16:49   DG Chest Portable 1 View  Result Date: 05/19/2020 CLINICAL DATA:  Upper abdominal pain and vomiting. EXAM: PORTABLE CHEST 1 VIEW COMPARISON:  May 17, 2020 FINDINGS: There is no evidence of acute infiltrate, pleural effusion or pneumothorax. The heart size and mediastinal contours are within normal limits. The visualized skeletal structures are unremarkable. IMPRESSION: No active disease. Electronically Signed   By: Virgina Norfolk M.D.   On: 05/19/2020 15:14    Procedures Procedures (including critical care time)  Medications Ordered in ED Medications  acetaminophen (TYLENOL) tablet 1,000 mg (1,000 mg Oral Refused 05/19/20 1432)  haloperidol lactate (HALDOL) injection 2 mg (2 mg Intravenous Given 05/19/20 1431)  sodium chloride 0.9 % bolus 1,000 mL (0 mLs Intravenous Stopped 05/19/20 1544)  sodium chloride 0.9 % bolus 1,000 mL (0 mLs Intravenous Stopped 05/19/20 1639)  potassium chloride 10 mEq in 100 mL IVPB (0 mEq Intravenous  Stopped 05/19/20 1639)  ondansetron (ZOFRAN) injection 4 mg (4 mg Intravenous Given 05/19/20 1547)  LORazepam (ATIVAN) injection 1 mg (1 mg Intravenous Given 05/19/20 1640)    ED Course  I have reviewed the triage vital signs and  the nursing notes.  Pertinent labs & imaging results that were available during my care of the patient were reviewed by me and considered in my medical decision making (see chart for details).    MDM Rules/Calculators/A&P                          Pt's POC pregnancy test last night was negative, not repeated today.   Patient's labs reviewed, she has an increasing leukocytosis from yesterday, also some mild renal insufficiency with a creatinine of 1.2 and hypokalemia.  Discussed patient and persistent symptoms with Dr. Laural Golden of GI, who technically is not on call for [place] today, his partner is, but recommends admission and GI consult with consideration for EGD given unexplained pain, intractable vomiting and leukocytosis.  Discussed with Dr. Nehemiah Settle who will see pt in ed.    covid screening ordered.    Final Clinical Impression(s) / ED Diagnoses Final diagnoses:  Pain of upper abdomen  Intractable vomiting with nausea, unspecified vomiting type  Leukocytosis, unspecified type  Hypokalemia    Rx / DC Orders ED Discharge Orders    None       Landis Martins 05/19/20 1703    Elnora Morrison, MD 05/23/20 [phone]

## 2020-05-20 DIAGNOSIS — R1110 Vomiting, unspecified: Secondary | ICD-10-CM

## 2020-05-20 DIAGNOSIS — F17210 Nicotine dependence, cigarettes, uncomplicated: Secondary | ICD-10-CM | POA: Diagnosis present

## 2020-05-20 DIAGNOSIS — F419 Anxiety disorder, unspecified: Secondary | ICD-10-CM | POA: Diagnosis present

## 2020-05-20 DIAGNOSIS — Z818 Family history of other mental and behavioral disorders: Secondary | ICD-10-CM | POA: Diagnosis not present

## 2020-05-20 DIAGNOSIS — D72829 Elevated white blood cell count, unspecified: Secondary | ICD-10-CM | POA: Diagnosis not present

## 2020-05-20 DIAGNOSIS — R1010 Upper abdominal pain, unspecified: Secondary | ICD-10-CM | POA: Diagnosis not present

## 2020-05-20 DIAGNOSIS — E860 Dehydration: Secondary | ICD-10-CM | POA: Diagnosis present

## 2020-05-20 DIAGNOSIS — K219 Gastro-esophageal reflux disease without esophagitis: Secondary | ICD-10-CM

## 2020-05-20 DIAGNOSIS — F329 Major depressive disorder, single episode, unspecified: Secondary | ICD-10-CM | POA: Diagnosis present

## 2020-05-20 DIAGNOSIS — M545 Low back pain: Secondary | ICD-10-CM | POA: Diagnosis present

## 2020-05-20 DIAGNOSIS — R112 Nausea with vomiting, unspecified: Secondary | ICD-10-CM | POA: Diagnosis not present

## 2020-05-20 DIAGNOSIS — E669 Obesity, unspecified: Secondary | ICD-10-CM

## 2020-05-20 DIAGNOSIS — D72823 Leukemoid reaction: Secondary | ICD-10-CM | POA: Diagnosis present

## 2020-05-20 DIAGNOSIS — Z20822 Contact with and (suspected) exposure to covid-19: Secondary | ICD-10-CM | POA: Diagnosis present

## 2020-05-20 DIAGNOSIS — R1012 Left upper quadrant pain: Secondary | ICD-10-CM | POA: Diagnosis not present

## 2020-05-20 DIAGNOSIS — Z6838 Body mass index (BMI) 38.0-38.9, adult: Secondary | ICD-10-CM | POA: Diagnosis not present

## 2020-05-20 DIAGNOSIS — F1211 Cannabis abuse, in remission: Secondary | ICD-10-CM | POA: Diagnosis present

## 2020-05-20 DIAGNOSIS — R1013 Epigastric pain: Secondary | ICD-10-CM | POA: Diagnosis not present

## 2020-05-20 DIAGNOSIS — K2960 Other gastritis without bleeding: Secondary | ICD-10-CM | POA: Diagnosis present

## 2020-05-20 DIAGNOSIS — Z83438 Family history of other disorder of lipoprotein metabolism and other lipidemia: Secondary | ICD-10-CM | POA: Diagnosis not present

## 2020-05-20 DIAGNOSIS — Z79899 Other long term (current) drug therapy: Secondary | ICD-10-CM | POA: Diagnosis not present

## 2020-05-20 DIAGNOSIS — K920 Hematemesis: Secondary | ICD-10-CM | POA: Diagnosis not present

## 2020-05-20 DIAGNOSIS — K259 Gastric ulcer, unspecified as acute or chronic, without hemorrhage or perforation: Secondary | ICD-10-CM | POA: Diagnosis present

## 2020-05-20 DIAGNOSIS — K760 Fatty (change of) liver, not elsewhere classified: Secondary | ICD-10-CM | POA: Diagnosis present

## 2020-05-20 DIAGNOSIS — E876 Hypokalemia: Secondary | ICD-10-CM | POA: Diagnosis present

## 2020-05-20 DIAGNOSIS — Z8711 Personal history of peptic ulcer disease: Secondary | ICD-10-CM | POA: Diagnosis not present

## 2020-05-20 DIAGNOSIS — K529 Noninfective gastroenteritis and colitis, unspecified: Secondary | ICD-10-CM | POA: Diagnosis present

## 2020-05-20 DIAGNOSIS — Z833 Family history of diabetes mellitus: Secondary | ICD-10-CM | POA: Diagnosis not present

## 2020-05-20 DIAGNOSIS — Z720 Tobacco use: Secondary | ICD-10-CM

## 2020-05-20 DIAGNOSIS — K2100 Gastro-esophageal reflux disease with esophagitis, without bleeding: Secondary | ICD-10-CM | POA: Diagnosis not present

## 2020-05-20 DIAGNOSIS — G8929 Other chronic pain: Secondary | ICD-10-CM | POA: Diagnosis present

## 2020-05-20 DIAGNOSIS — R109 Unspecified abdominal pain: Secondary | ICD-10-CM | POA: Diagnosis not present

## 2020-05-20 HISTORY — DX: Vomiting, unspecified: R11.10

## 2020-05-20 LAB — RAPID URINE DRUG SCREEN, HOSP PERFORMED
Amphetamines: NOT DETECTED
Barbiturates: NOT DETECTED
Benzodiazepines: POSITIVE — AB
Cocaine: NOT DETECTED
Opiates: POSITIVE — AB
Tetrahydrocannabinol: POSITIVE — AB

## 2020-05-20 LAB — CBC
HCT: 44.6 % (ref 36.0–46.0)
Hemoglobin: 15.1 g/dL — ABNORMAL HIGH (ref 12.0–15.0)
MCH: 31.5 pg (ref 26.0–34.0)
MCHC: 33.9 g/dL (ref 30.0–36.0)
MCV: 93.1 fL (ref 80.0–100.0)
Platelets: 253 10*3/uL (ref 150–400)
RBC: 4.79 MIL/uL (ref 3.87–5.11)
RDW: 12.2 % (ref 11.5–15.5)
WBC: 14.4 10*3/uL — ABNORMAL HIGH (ref 4.0–10.5)
nRBC: 0 % (ref 0.0–0.2)

## 2020-05-20 LAB — HIV ANTIBODY (ROUTINE TESTING W REFLEX): HIV Screen 4th Generation wRfx: NONREACTIVE

## 2020-05-20 LAB — HEPATIC FUNCTION PANEL
ALT: 25 U/L (ref 0–44)
AST: 14 U/L — ABNORMAL LOW (ref 15–41)
Albumin: 3.7 g/dL (ref 3.5–5.0)
Alkaline Phosphatase: 59 U/L (ref 38–126)
Bilirubin, Direct: 0.2 mg/dL (ref 0.0–0.2)
Indirect Bilirubin: 0.6 mg/dL (ref 0.3–0.9)
Total Bilirubin: 0.8 mg/dL (ref 0.3–1.2)
Total Protein: 6.4 g/dL — ABNORMAL LOW (ref 6.5–8.1)

## 2020-05-20 LAB — BASIC METABOLIC PANEL
Anion gap: 8 (ref 5–15)
BUN: 8 mg/dL (ref 6–20)
CO2: 28 mmol/L (ref 22–32)
Calcium: 8.8 mg/dL — ABNORMAL LOW (ref 8.9–10.3)
Chloride: 102 mmol/L (ref 98–111)
Creatinine, Ser: 0.82 mg/dL (ref 0.44–1.00)
GFR calc Af Amer: >60 mL/min (ref >60)
GFR calc non Af Amer: >60 mL/min (ref >60)
Glucose, Bld: 107 mg/dL — ABNORMAL HIGH (ref 70–99)
Potassium: 3.3 mmol/L — ABNORMAL LOW (ref 3.5–5.1)
Sodium: 138 mmol/L (ref 135–145)

## 2020-05-20 LAB — HEMOGLOBIN A1C
Hgb A1c MFr Bld: 5.2 % (ref 4.8–5.6)
Mean Plasma Glucose: 102.54 mg/dL

## 2020-05-20 MED ORDER — HYDROMORPHONE HCL 1 MG/ML IJ SOLN
0.5000 mg | Freq: Once | INTRAMUSCULAR | Status: AC
  Administered 2020-05-20: 0.5 mg via INTRAVENOUS
  Filled 2020-05-20: qty 0.5

## 2020-05-20 MED ORDER — BOOST / RESOURCE BREEZE PO LIQD CUSTOM
1.0000 | Freq: Three times a day (TID) | ORAL | Status: DC
  Administered 2020-05-20 – 2020-05-21 (×4): 1 via ORAL

## 2020-05-20 MED ORDER — NICOTINE 21 MG/24HR TD PT24
21.0000 mg | MEDICATED_PATCH | Freq: Every day | TRANSDERMAL | Status: DC
  Administered 2020-05-20: 21 mg via TRANSDERMAL
  Filled 2020-05-20: qty 1

## 2020-05-20 MED ORDER — ONDANSETRON HCL 4 MG/2ML IJ SOLN
4.0000 mg | Freq: Four times a day (QID) | INTRAMUSCULAR | Status: DC
  Administered 2020-05-20 – 2020-05-22 (×8): 4 mg via INTRAVENOUS
  Filled 2020-05-20 (×9): qty 2

## 2020-05-20 MED ORDER — POTASSIUM CHLORIDE IN NACL 20-0.9 MEQ/L-% IV SOLN: INTRAVENOUS | Status: DC

## 2020-05-20 NOTE — Progress Notes (Signed)
Pt's IV noted to be partially unscrewed at cath and fluids were leaking out. States that she was still in pain after IV pain medication was administered. Notified on call MD Randol Kern. New order for one time repeat dose of dilaudid.

## 2020-05-20 NOTE — Progress Notes (Addendum)
PROGRESS NOTE  Debbie Santana YBW:[id number] DOB: 04-09-1991 DOA: 05/19/2020 PCP: Patient, No Pcp Per  Brief History:  29 year old female with a history of anxiety/depression, reflux esophagitis/erosive gastritis, tobacco abuse, marijuana use presenting with 2-week history of upper abdominal pain isolated to the epigastric region.  The patient describes the pain as moderate and going straight through into her back.  She denies any alcohol or illegal drug use, but states that she had used marijuana on a daily basis, 2-3 times per day.  She stated that she quit using the marijuana approximately 6 to 7 days prior to this admission.  She denies any over-the-counter medications including NSAIDs.  Over the last 2 to 3 days, the patient has had worsening symptoms to the point where her abdominal pain has been constant where it was previously intermittent.  She was having difficulty tolerating her pills.  She had numerous episodes of emesis on 05/19/2020.  She stated that she began seeing some blood in her emesis during the latter episodes.  She had 1 bowel movement on 05/19/2020 without any hematochezia or melena.  She denies any new medications.  She denies any sick contacts, travels, or exotic or unusual foods.  She has had some dysuria without any hematuria.  She smokes 1/2 pack/day for the last 10 years.  She has had numerous ED visits because of abdominal pain most notably on 05/16/2020 as well as 05/18/2020.  She has an appointment to see [facility] on 06/15/2020.  05/18/2020 CT abdomen/pelvis showed fatty liver, right basilar atelectasis, but was otherwise unremarkable without any bowel wall thickening or pancreatic inflammation. In the emergency department, the patient was afebrile hemodynamically stable with oxygen saturation 100% room air.  BMP showed a serum creatinine of 1.20 and potassium 2.9.  LFTs were unremarkable with lipase 21.  WBC 19.9, hemoglobin 15.9, platelets 347,000.  Urine drug  screen was positive for THC, benzodiazepines, and opiates.  Urinalysis was negative for pyuria.  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 -Concerned about underlying erosive gastropathy/reflux esophagitis -continue clear liquid diet; still unable to tolerate solid food -Continue PPI twice daily  -Continue IV fluids -Zofran around-the-clock -GI consult -Lipase 21 -05/18/2020 CT abdomen--essentially negative -UA negative for pyuria -UDS--positive for THC, benzo, opiate  Dehydration -Continue IV fluids  Leukemoid reaction -Suspect stress demargination -Do not plan to start antibiotics presently -Monitor patient clinically -personally reviewed CXR--no infiltrates or edema -UA--neg for pyuria  Anxiety/depression -Continue BuSpar, Atarax, Paxil, trazodone  Tobacco abuse -NicoDerm patch deferred -Cessation discussed  Morbid obesity -BMI 38.01 -Lifestyle modification  Hypokalemia -Replete -Magnesium 1.8 -Add potassium to IV fluids      Status is: Observation  The patient will require care spanning > 2 midnights and should be moved to inpatient because: IV treatments appropriate due to intensity of illness or inability to take PO  Dispo: The patient is from: Home              Anticipated d/c is to: Home              Anticipated d/c date is: 2 days              Patient currently is not medically stable to d/c.        Family Communication:   Family at bedside  Consultants:  GI  Code Status:  FULL   DVT Prophylaxis:  [place] Lovenox   Procedures: As  Listed in Progress Note Above  Antibiotics: None       Subjective: Patient had 1 episode of emesis overnight.  She has 9 more bowel movements.  She denies any fevers, chills, chest pain, cough, hemoptysis, hematemesis, diarrhea, dysuria.  She has had some abdominal pain.  This is epigastric region moderate to  severe.  Objective: Vitals:   05/19/20 1845 05/19/20 2247 05/20/20 0320 05/20/20 0649  BP: (!) 155/96 (!) 160/95 121/75 116/84  Pulse: (!) 57 (!) 55 (!) 58 (!) 54  Resp: 18 20 18 18   Temp: 98.7 F (37.1 C) 98.1 F (36.7 C) 98.2 F (36.8 C) 98.5 F (36.9 C)  TempSrc: Oral Oral Oral Oral  SpO2: 100% 96% 98% 99%  Weight:      Height:        Intake/Output Summary (Last 24 hours) at 05/20/2020 0731 Last data filed at 05/20/2020 0300 Gross per 24 hour  Intake 2929.13 ml  Output --  Net 2929.13 ml   Weight change:  Exam:   General:  Pt is alert, follows commands appropriately, not in acute distress  HEENT: No icterus, No thrush, No neck mass, [place]/AT  Cardiovascular: RRR, S1/S2, no rubs, no gallops  Respiratory: CTA bilaterally, no wheezing, no crackles, no rhonchi  Abdomen: Soft/+BS, epigastric tender, non distended, no guarding  Extremities: No edema, No lymphangitis, No petechiae, No rashes, no synovitis   Data Reviewed: I have personally reviewed following labs and imaging studies Basic Metabolic Panel: Recent Labs  Lab 05/17/20 0128 05/18/20 1012 05/19/20 1406  NA 138 134* 137  K 2.9* 3.1* 2.9*  CL 102 102 103  CO2 21* 22 20*  GLUCOSE 130* 107* 132*  BUN 16 13 14   CREATININE 1.07* 0.91 1.20*  CALCIUM 9.9 9.3 10.0  MG  --   --  1.8   Liver Function Tests: Recent Labs  Lab 05/17/20 0128 05/18/20 1012 05/19/20 1406  AST 17 22 22   ALT 18 26 34  ALKPHOS 66 62 67  BILITOT 1.1 1.2 1.0  PROT 8.2* 7.3 7.4  ALBUMIN 4.8 4.4 4.4   Recent Labs  Lab 05/17/20 0128 05/18/20 1012 05/19/20 1406  LIPASE 24 88* 21   No results for input(s): AMMONIA in the last 168 hours. Coagulation Profile: No results for input(s): INR, PROTIME in the last 168 hours. CBC: Recent Labs  Lab 05/17/20 0128 05/17/20 0854 05/18/20 1012 05/19/20 1406  WBC 23.4* 16.1* 16.2* 19.9*  NEUTROABS  --   --   --  14.2*  HGB 15.7* 14.7 14.8 15.9*  HCT 45.9 42.6 42.6 46.2*  MCV  91.1 92.4 91.8 90.8  PLT 314 256 277 347   Cardiac Enzymes: No results for input(s): CKTOTAL, CKMB, CKMBINDEX, TROPONINI in the last 168 hours. BNP: Invalid input(s): POCBNP CBG: No results for input(s): GLUCAP in the last 168 hours. HbA1C: No results for input(s): HGBA1C in the last 72 hours. Urine analysis:    Component Value Date/Time   COLORURINE YELLOW 05/18/2020 0934   APPEARANCEUR HAZY (A) 05/18/2020 0934   APPEARANCEUR Clear 11/04/2017 1159   LABSPEC 1.015 05/18/2020 0934   PHURINE 6.0 05/18/2020 0934   GLUCOSEU NEGATIVE 05/18/2020 0934   HGBUR NEGATIVE 05/18/2020 0934   BILIRUBINUR NEGATIVE 05/18/2020 0934   BILIRUBINUR Negative 11/04/2017 1159   KETONESUR 5 (A) 05/18/2020 0934   PROTEINUR 30 (A) 05/18/2020 0934   UROBILINOGEN 0.2 05/14/2015 0028   NITRITE NEGATIVE 05/18/2020 0934   LEUKOCYTESUR NEGATIVE 05/18/2020 0934   Sepsis Labs: @LABRCNTIP (procalcitonin:4,lacticidven:4) )  Recent Results (from the past 240 hour(s))  SARS Coronavirus 2 by RT PCR (hospital order, performed in [facility] hospital lab) Nasopharyngeal Nasopharyngeal Swab     Status: None   Collection Time: 05/17/20  3:23 AM   Specimen: Nasopharyngeal Swab  Result Value Ref Range Status   SARS Coronavirus 2 NEGATIVE NEGATIVE Final    Comment: (NOTE) SARS-CoV-2 target nucleic acids are NOT DETECTED.  The SARS-CoV-2 RNA is generally detectable in upper and lower respiratory specimens during the acute phase of infection. The lowest concentration of SARS-CoV-2 viral copies this assay can detect is 250 copies / mL. A negative result does not preclude SARS-CoV-2 infection and should not be used as the sole basis for treatment or other patient management decisions.  A negative result may occur with improper specimen collection / handling, submission of specimen other than nasopharyngeal swab, presence of viral mutation(s) within the areas targeted by this assay, and inadequate number of viral  copies (<250 copies / mL). A negative result must be combined with clinical observations, patient history, and epidemiological information.  Fact Sheet for Patients:   StrictlyIdeas.no  Fact Sheet for Healthcare Providers: BankingDealers.co.za  This test is not yet approved or  cleared by the [place] FDA and has been authorized for detection and/or diagnosis of SARS-CoV-2 by FDA under an Emergency Use Authorization (EUA).  This EUA will remain in effect (meaning this test can be used) for the duration of the COVID-19 declaration under Section 564(b)(1) of the Act, 21 U.S.C. section 360bbb-3(b)(1), unless the authorization is terminated or revoked sooner.  Performed at [facility], [address] [place], [place] [address]   Urine culture     Status: Abnormal   Collection Time: 05/17/20  8:38 AM   Specimen: Urine, Random  Result Value Ref Range Status   Specimen Description   Final    URINE, RANDOM Performed at [facility], [address], [place], [place] [address]    Special Requests   Final    NONE Performed at [facility], [address] [place], [place] [address]    Culture MULTIPLE SPECIES PRESENT, SUGGEST RECOLLECTION (A)  Final   Report Status 05/18/2020 FINAL  Final  SARS Coronavirus 2 by RT PCR (hospital order, performed in [facility] hospital lab) Nasopharyngeal Nasopharyngeal Swab     Status: None   Collection Time: 05/19/20  5:01 PM   Specimen: Nasopharyngeal Swab  Result Value Ref Range Status   SARS Coronavirus 2 NEGATIVE NEGATIVE Final    Comment: (NOTE) SARS-CoV-2 target nucleic acids are NOT DETECTED.  The SARS-CoV-2 RNA is generally detectable in upper and lower respiratory specimens during the acute phase of infection. The lowest concentration of SARS-CoV-2 viral copies this assay can detect is 250 copies / mL. A negative result does not preclude SARS-CoV-2 infection and should not be used as the  sole basis for treatment or other patient management decisions.  A negative result may occur with improper specimen collection / handling, submission of specimen other than nasopharyngeal swab, presence of viral mutation(s) within the areas targeted by this assay, and inadequate number of viral copies (<250 copies / mL). A negative result must be combined with clinical observations, patient history, and epidemiological information.  Fact Sheet for Patients:   StrictlyIdeas.no  Fact Sheet for Healthcare Providers: BankingDealers.co.za  This test is not yet approved or  cleared by the [place] FDA and has been authorized for detection and/or diagnosis of SARS-CoV-2 by FDA under an Emergency Use  Authorization (EUA).  This EUA will remain in effect (meaning this test can be used) for the duration of the COVID-19 declaration under Section 564(b)(1) of the Act, 21 U.S.C. section 360bbb-3(b)(1), unless the authorization is terminated or revoked sooner.  Performed at [facility], [address] [place], [place] [address]      Scheduled Meds: . acetaminophen  1,000 mg Oral Once  . busPIRone  15 mg Oral BID  . dicyclomine  10 mg Oral TID AC & HS  . feeding supplement  1 Container Oral TID BM  . hydrOXYzine  25 mg Oral Q6H  . metoCLOPramide (REGLAN) injection  10 mg Intravenous Q6H  . pantoprazole (PROTONIX) IV  40 mg Intravenous Q12H  . PARoxetine  40 mg Oral Daily  . traZODone  50 mg Oral QHS   Continuous Infusions: . sodium chloride 100 mL/hr at 05/19/20 1936    Procedures/Studies: CT ABDOMEN PELVIS W CONTRAST  Result Date: 05/18/2020 CLINICAL DATA:  Left-sided abdominal pain with nausea and vomiting EXAM: CT ABDOMEN AND PELVIS WITH CONTRAST TECHNIQUE: Multidetector CT imaging of the abdomen and pelvis was performed using the standard protocol following bolus administration of intravenous contrast. CONTRAST:  137mL OMNIPAQUE  IOHEXOL 300 MG/ML  SOLN COMPARISON:  April 26, 2020 FINDINGS: Lower chest: There is mild atelectatic change in the right base. Lung bases otherwise are clear. Hepatobiliary: There is mild fatty infiltration near the fissure for the ligamentum teres. Beyond mild fatty infiltration, no focal liver lesions are evident. The gallbladder wall is not appreciably thickened. There is no intrahepatic biliary duct dilatation. The common hepatic duct measures 10 mm in diameter, upper normal for post cholecystectomy state. No obstructing lesion is seen by CT in the biliary ductal system. Pancreas: There is no appreciable pancreatic mass or inflammatory focus. Spleen: No splenic lesions are evident. Adrenals/Urinary Tract: Adrenals bilaterally appear normal. Kidneys bilaterally show no evident mass or hydronephrosis on either side. There is no appreciable renal or ureteral calculus on either side. Urinary bladder is midline with wall thickness within normal limits. Stomach/Bowel: There is no appreciable bowel wall or mesenteric thickening. The terminal ileum appears normal. There is a degree of fatty infiltration in the ileocecal valve. There is no evident bowel obstruction. There is no appreciable free air or portal venous air. Vascular/Lymphatic: There is no abdominal aortic aneurysm. No arterial vascular lesions are appreciable. Major venous structures appear patent. No evident adenopathy in the abdomen or pelvis. Reproductive: The uterus is anteverted. There is an apparent dominant follicle in the right ovary measuring 2.2 x 1.9 cm. There is apparent cystic change in each vaginal labium. An apparent cystic area arising in the right vaginal labium measures 2.3 x 1.3 cm. A similar appearing area on the left measures 1.7 x 1.3 cm. The cystic areas presumably represent Bartholin's gland cysts. Other: The appendix appears normal. There is no evident abscess or ascites in the abdomen or pelvis. Musculoskeletal: No blastic or lytic  bone lesions. There is no intramuscular or abdominal wall lesion evident. IMPRESSION: 1. A cause for patient's symptoms has not been established with this study. 2. No bowel wall thickening or bowel obstruction. No abscess in the abdomen or pelvis. Appendix appears normal. 3. Stable apparent bilateral Bartholin's gland cysts in the vaginal labia. 4. No renal or ureteral calculi. No hydronephrosis. Urinary bladder wall thickness normal. 5. Gallbladder absent. Common hepatic duct upper normal in size. No biliary ductal obstruction evident by CT. Electronically Signed   By: Lowella Grip III  M.D.   On: 05/18/2020 16:49   DG Chest Portable 1 View  Result Date: 05/19/2020 CLINICAL DATA:  Upper abdominal pain and vomiting. EXAM: PORTABLE CHEST 1 VIEW COMPARISON:  May 17, 2020 FINDINGS: There is no evidence of acute infiltrate, pleural effusion or pneumothorax. The heart size and mediastinal contours are within normal limits. The visualized skeletal structures are unremarkable. IMPRESSION: No active disease. Electronically Signed   By: Virgina Norfolk M.D.   On: 05/19/2020 15:14   DG Chest Port 1 View  Result Date: 05/17/2020 CLINICAL DATA:  29 year old female with cough. EXAM: PORTABLE CHEST 1 VIEW COMPARISON:  Chest radiograph dated 02/23/2020. FINDINGS: The heart size and mediastinal contours are within normal limits. Both lungs are clear. The visualized skeletal structures are unremarkable. IMPRESSION: No active disease. Electronically Signed   By: Anner Crete M.D.   On: 05/17/2020 03:15    Orson Eva, DO  [facility]  If 7PM-7AM, please contact night-coverage [email or web address] Password [facility] 05/20/2020, 7:31 AM   LOS: 0 days

## 2020-05-20 NOTE — Consult Note (Signed)
Referring Provider: No ref. provider found Primary Care Physician:  Patient, No Pcp Per Primary Gastroenterologist:  Dr.  Laural Golden (patient scheduled to see Dr. Jenetta Downer next month for an office consultation)  Reason for Consultation: Abdominal pain, nausea and vomiting.  HPI: 29 year old lady with a history of chronic mild bandlike upper abdominal pain, much worse over the past week with associated nausea vomiting with blood-tinged emesis admitted 2 days ago (8/27) for being seen in the ED with epigastric and left upper quadrant abdominal pain.  Apparently 3 ED visits recently (here and at [facility]).  On 8/25 she had a white count of 23,400.  On admission 16.2; yesterday 19.9; 14.4 today..  Contrast CT of the abdomen demonstrated no acute abnormalities.  Gallbladder absent.  No dilation of the biliary tree.  No inflammatory changes about the pancreas.  LFTs repeatedly normal. Lipase elevated minimally 2 days ago at 88 1 day ago;  21 today. Patient endorses no melena or hematochezia.  She has had chronic diarrhea since cholecystectomy. States she has reflux symptoms frequently on a chronic basis.  Takes Prilosec.  No dysphagia. Somewhat similar presentation 2018 for which she underwent an EGD by Dr. Laural Golden (non-H. pylori gastritis).   Normal gallbladder on ultrasound previously but low EF on HIDA.  Cholecystectomy by Dr. Arnoldo Morale in 2018.  Patient states some improvement in chronic abdominal pain but then she started having loose stools chronically.. Was taking ibuprofen a few weeks ago for abdominal pain but was told to stop.  At one point recently, she was advised to take ibuprofen by a provider elsewhere. History of regular marijuana use (To combat abdominal pain and nausea).  States she stopped recently as it was not helping. No recent fever.  No sick contacts.  No travel. Treatment thus far in the hospital included IV Zofran, PPI and dicyclomine along with Dilaudid to control her  symptoms.  Past Medical History:  Diagnosis Date  . Anxiety   . Bradycardia 04/17/2017  . Chronic back pain   . 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 Gastric ulcer   . Genital warts   . History of kidney stones   . Mental disorder   . Peptic ulcer disease 04/13/2017  . Sciatica   . Vaginal Pap smear, abnormal     Past Surgical History:  Procedure Laterality Date  . BACK SURGERY     ruptured disc.  . BIOPSY  04/14/2017   Procedure: BIOPSY;  Surgeon: Rogene Houston, MD;  Location: [facility];  Service: Endoscopy;;  gastric  . CERVICAL ABLATION N/A 12/02/2018   Procedure: LASER ABLATION OF CERVIX;  Surgeon: Florian Buff, MD;  Location: [facility];  Service: Gynecology;  Laterality: N/A;  . CHOLECYSTECTOMY N/A 04/17/2017   Procedure: LAPAROSCOPIC CHOLECYSTECTOMY;  Surgeon: Aviva Signs, MD;  Location: [facility];  Service: General;  Laterality: N/A;  . COLPOSCOPY    . ESOPHAGOGASTRODUODENOSCOPY (EGD) WITH PROPOFOL N/A 04/14/2017   Procedure: ESOPHAGOGASTRODUODENOSCOPY (EGD) WITH PROPOFOL;  Surgeon: Rogene Houston, MD;  Location: [facility];  Service: Endoscopy;  Laterality: N/A;  . HERNIA REPAIR Bilateral    inguinal  . LASER ABLATION CONDOLAMATA N/A 12/02/2018   Procedure: LASER ABLATION CONDYLOMA ACCUMINATA LEFT AND RIGHT VULVA, PERINEUM AND PERIANAL (15 TOTAL);  Surgeon: Florian Buff, MD;  Location: [facility];  Service: Gynecology;  Laterality: N/A;    Prior to Admission medications   Medication Sig Start Date End Date Taking? Authorizing Provider  busPIRone (BUSPAR) 7.5 MG tablet Take  2 tablets by mouth twice daily 05/02/20  Yes Derrek Monaco A, NP  dicyclomine (BENTYL) 10 MG capsule Take 1 capsule (10 mg total) by mouth 4 (four) times daily -  before meals and at bedtime. 05/10/20  Yes Estill Dooms, NP  etonogestrel (NEXPLANON) 68 MG IMPL implant 1 each by Subdermal route once.   Yes [provider]  hydrOXYzine (ATARAX/VISTARIL) 25 MG tablet Take 1 tablet (25 mg  total) by mouth every 6 (six) hours. 05/11/20  Yes Estill Dooms, NP  omeprazole (PRILOSEC) 20 MG capsule Take 1 capsule (20 mg total) by mouth daily. 05/17/20  Yes Rolland Porter, MD  ondansetron (ZOFRAN ODT) 4 MG disintegrating tablet Take 1 tablet (4 mg total) by mouth every 8 (eight) hours as needed for nausea or vomiting. 05/18/20  Yes Idol, Almyra Free, PA-C  PARoxetine (PAXIL) 40 MG tablet Take 1 tablet (40 mg total) by mouth every morning. 05/10/20  Yes Derrek Monaco A, NP  potassium chloride SA (KLOR-CON) 20 MEQ tablet Take 1 tablet (20 mEq total) by mouth 2 (two) times daily. 05/17/20  Yes Rolland Porter, MD  promethazine (PHENERGAN) 25 MG tablet TAKE 1 TABLET BY MOUTH EVERY 6 HOURS AS NEEDED FOR NAUSEA AND VOMITING Patient taking differently: Take 25 mg by mouth every 6 (six) hours as needed for nausea or vomiting.  04/24/20  Yes Derrek Monaco A, NP  rizatriptan (MAXALT) 10 MG tablet TAKE 1 TABLET BY MOUTH ONCE DAILY AS NEEDED FOR  MIGRAINE,  MAY  REPEAT  IN  2  HOURS  IF  NEEDED. 01/31/20  Yes Estill Dooms, NP  traZODone (DESYREL) 50 MG tablet TAKE 1 TABLET BY MOUTH AT BEDTIME 05/11/20  Yes Derrek Monaco A, NP  fluocinonide-emollient (LIDEX-E) 0.05 % cream Apply 1 application topically 2 (two) times daily. Patient not taking: Reported on 05/18/2020 01/27/20   Florian Buff, MD  ondansetron (ZOFRAN) 4 MG tablet Take 1 tablet (4 mg total) by mouth every 6 (six) hours. 05/17/20   Rolland Porter, MD    Current Facility-Administered Medications  Medication Dose Route Frequency Provider Last Rate Last Admin  . 0.9 % NaCl with KCl 20 mEq/ L  infusion   Intravenous Continuous Tat, Shanon Brow, MD 100 mL/hr at 05/20/20 0841 New Bag at 05/20/20 0841  . acetaminophen (TYLENOL) tablet 1,000 mg  1,000 mg Oral Once Stinson, Jacob J, DO      . busPIRone (BUSPAR) tablet 15 mg  15 mg Oral BID Truett Mainland, DO   15 mg at 05/20/20 6045  . dicyclomine (BENTYL) capsule 10 mg  10 mg Oral TID AC & HS Truett Mainland, DO   10 mg at 05/20/20 4098  . feeding supplement (BOOST / RESOURCE BREEZE) liquid 1 Container  1 Container Oral TID BM Truett Mainland, DO   1 Container at 05/20/20 0830  . HYDROmorphone (DILAUDID) injection 0.5 mg  0.5 mg Intravenous Q3H PRN Truett Mainland, DO   0.5 mg at 05/20/20 1191  . hydrOXYzine (ATARAX/VISTARIL) tablet 25 mg  25 mg Oral Q6H Truett Mainland, DO   25 mg at 05/20/20 4782  . ondansetron (ZOFRAN) injection 4 mg  4 mg Intravenous Q6H PRN Truett Mainland, DO   4 mg at 05/20/20 0411  . ondansetron (ZOFRAN) injection 4 mg  4 mg Intravenous Q6H Tat, David, MD      . pantoprazole (PROTONIX) injection 40 mg  40 mg Intravenous Q12H Truett Mainland, DO   40  mg at 05/20/20 0828  . PARoxetine (PAXIL) tablet 40 mg  40 mg Oral Daily Truett Mainland, DO   40 mg at 05/20/20 4259  . traZODone (DESYREL) tablet 50 mg  50 mg Oral QHS Truett Mainland, DO   50 mg at 05/19/20 2246    Allergies as of 05/19/2020  . (No Known Allergies)    Family History  Problem Relation Age of Onset  . Anxiety disorder Mother   . Hyperlipidemia Mother   . Crohn's disease Sister   . Diabetes Maternal Grandfather   . Diabetes Cousin   . Learning disabilities Cousin     Social History   Socioeconomic History  . Marital status: Single    Spouse name: Not on file  . Number of children: 1  . Years of education: Not on file  . Highest education level: Not on file  Occupational History  . Not on file  Tobacco Use  . Smoking status: Current Every Day Smoker    Packs/day: 0.50    Years: 5.00    Pack years: 2.50    Types: Cigarettes  . Smokeless tobacco: Never Used  Vaping Use  . Vaping Use: Never used  Substance and Sexual Activity  . Alcohol use: No  . Drug use: No  . Sexual activity: Yes    Birth control/protection: Implant  Other Topics Concern  . Not on file  Social History Narrative  . Not on file   Social Determinants of Health   Financial Resource Strain:   . Difficulty  of Paying Living Expenses: Not on file  Food Insecurity:   . Worried About Charity fundraiser in the Last Year: Not on file  . Ran Out of Food in the Last Year: Not on file  Transportation Needs:   . Lack of Transportation (Medical): Not on file  . Lack of Transportation (Non-Medical): Not on file  Physical Activity:   . Days of Exercise per Week: Not on file  . Minutes of Exercise per Session: Not on file  Stress:   . Feeling of Stress : Not on file  Social Connections:   . Frequency of Communication with Friends and Family: Not on file  . Frequency of Social Gatherings with Friends and Family: Not on file  . Attends Religious Services: Not on file  . Active Member of Clubs or Organizations: Not on file  . Attends Archivist Meetings: Not on file  . Marital Status: Not on file  Intimate Partner Violence:   . Fear of Current or Ex-Partner: Not on file  . Emotionally Abused: Not on file  . Physically Abused: Not on file  . Sexually Abused: Not on file    Review of Systems:  As in history of present illness.   Physical Exam: Vital signs in last 24 hours: Temp:  [97.4 F (36.3 C)-98.7 F (37.1 C)] 98.5 F (36.9 C) (08/28 0649) Pulse Rate:  [52-66] 54 (08/28 0649) Resp:  [15-24] 18 (08/28 0649) BP: (116-160)/(71-101) 116/84 (08/28 0649) SpO2:  [94 %-100 %] 99 % (08/28 0649) Weight:  [113.4 kg] 113.4 kg (08/27 1356) Last BM Date: 05/19/20 General:   Alert,  , pleasant and cooperative in NAD; accompanied by her mother.  Does not at all look acutely ill or toxic. Eyes:  Sclera clear, no icterus.   Conjunctiva pink. Lungs:  Clear throughout to auscultation.   No wheezes, crackles, or rhonchi. No acute distress. Heart:  Regular rate and rhythm; no murmurs,  clicks, rubs,  or gallops. Abdomen: Nondistended.  Positive bowel sounds.  Mild to moderate epigastric and bilateral upper quadrant tenderness.  No obvious mass organomegaly no rebound.    Intake/Output from  previous day: 08/27 0701 - 08/28 0700 In: 2929.1 [I.V.:438.3; IV Piggyback:2490.9] Out: -  Intake/Output this shift: No intake/output data recorded.  Lab Results: Recent Labs    05/18/20 1012 05/19/20 1406 05/20/20 0628  WBC 16.2* 19.9* 14.4*  HGB 14.8 15.9* 15.1*  HCT 42.6 46.2* 44.6  PLT 277 347 253   BMET Recent Labs    05/18/20 1012 05/19/20 1406 05/20/20 0628  NA 134* 137 138  K 3.1* 2.9* 3.3*  CL 102 103 102  CO2 22 20* 28  GLUCOSE 107* 132* 107*  BUN 13 14 8   CREATININE 0.91 1.20* 0.82  CALCIUM 9.3 10.0 8.8*   LFT Recent Labs    05/20/20 0628  PROT 6.4*  ALBUMIN 3.7  AST 14*  ALT 25  ALKPHOS 59  BILITOT 0.8  BILIDIR 0.2  IBILI 0.6   PT/INR No results for input(s): LABPROT, INR in the last 72 hours. Hepatitis Panel No results for input(s): HEPBSAG, HCVAB, HEPAIGM, HEPBIGM in the last 72 hours. C-Diff No results for input(s): CDIFFTOX in the last 72 hours.  Studies/Results: CT ABDOMEN PELVIS W CONTRAST  Result Date: 05/18/2020 CLINICAL DATA:  Left-sided abdominal pain with nausea and vomiting EXAM: CT ABDOMEN AND PELVIS WITH CONTRAST TECHNIQUE: Multidetector CT imaging of the abdomen and pelvis was performed using the standard protocol following bolus administration of intravenous contrast. CONTRAST:  138mL OMNIPAQUE IOHEXOL 300 MG/ML  SOLN COMPARISON:  April 26, 2020 FINDINGS: Lower chest: There is mild atelectatic change in the right base. Lung bases otherwise are clear. Hepatobiliary: There is mild fatty infiltration near the fissure for the ligamentum teres. Beyond mild fatty infiltration, no focal liver lesions are evident. The gallbladder wall is not appreciably thickened. There is no intrahepatic biliary duct dilatation. The common hepatic duct measures 10 mm in diameter, upper normal for post cholecystectomy state. No obstructing lesion is seen by CT in the biliary ductal system. Pancreas: There is no appreciable pancreatic mass or inflammatory  focus. Spleen: No splenic lesions are evident. Adrenals/Urinary Tract: Adrenals bilaterally appear normal. Kidneys bilaterally show no evident mass or hydronephrosis on either side. There is no appreciable renal or ureteral calculus on either side. Urinary bladder is midline with wall thickness within normal limits. Stomach/Bowel: There is no appreciable bowel wall or mesenteric thickening. The terminal ileum appears normal. There is a degree of fatty infiltration in the ileocecal valve. There is no evident bowel obstruction. There is no appreciable free air or portal venous air. Vascular/Lymphatic: There is no abdominal aortic aneurysm. No arterial vascular lesions are appreciable. Major venous structures appear patent. No evident adenopathy in the abdomen or pelvis. Reproductive: The uterus is anteverted. There is an apparent dominant follicle in the right ovary measuring 2.2 x 1.9 cm. There is apparent cystic change in each vaginal labium. An apparent cystic area arising in the right vaginal labium measures 2.3 x 1.3 cm. A similar appearing area on the left measures 1.7 x 1.3 cm. The cystic areas presumably represent Bartholin's gland cysts. Other: The appendix appears normal. There is no evident abscess or ascites in the abdomen or pelvis. Musculoskeletal: No blastic or lytic bone lesions. There is no intramuscular or abdominal wall lesion evident. IMPRESSION: 1. A cause for patient's symptoms has not been established with this study. 2. No bowel wall  thickening or bowel obstruction. No abscess in the abdomen or pelvis. Appendix appears normal. 3. Stable apparent bilateral Bartholin's gland cysts in the vaginal labia. 4. No renal or ureteral calculi. No hydronephrosis. Urinary bladder wall thickness normal. 5. Gallbladder absent. Common hepatic duct upper normal in size. No biliary ductal obstruction evident by CT. Electronically Signed   By: Lowella Grip III M.D.   On: 05/18/2020 16:49   DG Chest  Portable 1 View  Result Date: 05/19/2020 CLINICAL DATA:  Upper abdominal pain and vomiting. EXAM: PORTABLE CHEST 1 VIEW COMPARISON:  May 17, 2020 FINDINGS: There is no evidence of acute infiltrate, pleural effusion or pneumothorax. The heart size and mediastinal contours are within normal limits. The visualized skeletal structures are unremarkable. IMPRESSION: No active disease. Electronically Signed   By: Virgina Norfolk M.D.   On: 05/19/2020 15:14    Impression:   29 year old lady admitted to the hospital with acute on chronic upper abdominal pain with vomiting of blood-tinged emesis and leukocytosis.  History of chronic cholecystitis status post cholecystectomy 3 years ago. Poorly controlled GERD and chronically loose stools since cholecystectomy but not worse recently. Recent NSAID and marijuana use.  She appears to be presenting with a gastroenteritis type picture.  Abdominal pain is a little out of proportion to typical gastroenteritis.  Fairly extensive evaluation to this point as outlined above. Mildly elevated lipase is likely nonspecific and not indicative of pancreatitis.  I doubt a significant GI bleed at this point but do agree with the twice daily PPI therapy empirically for the time being.  I do not see a need for antibiotics or stool studies at this time.  Recommendations: Agree with symptomatic treatment of nausea along with judicious use of analgesics, antispasmodics. If she is not significantly improved in the next 24 to 48 hours, would consider an EGD the first of the week.  She would require propofol.  Further recommendations to follow.        Notice:  This dictation was prepared with Dragon dictation along with smaller phrase technology. Any transcriptional errors that result from this process are unintentional and may not be corrected upon review.

## 2020-05-21 LAB — BASIC METABOLIC PANEL
Anion gap: 8 (ref 5–15)
BUN: 5 mg/dL — ABNORMAL LOW (ref 6–20)
CO2: 27 mmol/L (ref 22–32)
Calcium: 8.9 mg/dL (ref 8.9–10.3)
Chloride: 105 mmol/L (ref 98–111)
Creatinine, Ser: 0.93 mg/dL (ref 0.44–1.00)
GFR calc Af Amer: >60 mL/min (ref >60)
GFR calc non Af Amer: >60 mL/min (ref >60)
Glucose, Bld: 93 mg/dL (ref 70–99)
Potassium: 3.7 mmol/L (ref 3.5–5.1)
Sodium: 140 mmol/L (ref 135–145)

## 2020-05-21 LAB — URINALYSIS, COMPLETE (UACMP) WITH MICROSCOPIC
Bilirubin Urine: NEGATIVE
Glucose, UA: NEGATIVE mg/dL
Ketones, ur: NEGATIVE mg/dL
Leukocytes,Ua: NEGATIVE
Nitrite: NEGATIVE
Protein, ur: NEGATIVE mg/dL
Specific Gravity, Urine: 1.002 — ABNORMAL LOW (ref 1.005–1.030)
pH: 6 (ref 5.0–8.0)

## 2020-05-21 LAB — CBC
HCT: 41.7 % (ref 36.0–46.0)
Hemoglobin: 13.6 g/dL (ref 12.0–15.0)
MCH: 31.3 pg (ref 26.0–34.0)
MCHC: 32.6 g/dL (ref 30.0–36.0)
MCV: 96.1 fL (ref 80.0–100.0)
Platelets: 221 10*3/uL (ref 150–400)
RBC: 4.34 MIL/uL (ref 3.87–5.11)
RDW: 12.3 % (ref 11.5–15.5)
WBC: 8.7 10*3/uL (ref 4.0–10.5)
nRBC: 0 % (ref 0.0–0.2)

## 2020-05-21 LAB — MAGNESIUM: Magnesium: 1.9 mg/dL (ref 1.7–2.4)

## 2020-05-21 MED ORDER — NICOTINE 14 MG/24HR TD PT24
14.0000 mg | MEDICATED_PATCH | Freq: Every day | TRANSDERMAL | Status: DC
  Administered 2020-05-21 – 2020-05-22 (×2): 14 mg via TRANSDERMAL
  Filled 2020-05-21 (×2): qty 1

## 2020-05-21 NOTE — Progress Notes (Signed)
States nausea some better.  Still with upper abdominal pain.  Tolerating clear liquids. No diarrhea.  Requesting Dilaudid pretty much around-the-clock.  Vital signs in last 24 hours: Temp:  [97.7 F (36.5 C)-98 F (36.7 C)] 97.7 F (36.5 C) (08/29 0618) Pulse Rate:  [57] 57 (08/29 0618) Resp:  [18] 18 (08/29 0618) BP: (112-120)/(77-82) 112/82 (08/29 0618) SpO2:  [98 %-99 %] 98 % (08/29 0618) Last BM Date: 05/18/20 General:   Alert,   pleasant and cooperative in NAD Abdomen: Nondistended.  Positive bowel sounds.  Minimal epigastric tenderness. Extremities:  Without clubbing or edema.    Intake/Output from previous day: 08/28 0701 - 08/29 0700 In: 1684.4 [I.V.:1684.4] Out: -  Intake/Output this shift: No intake/output data recorded.  Lab Results: Recent Labs    05/19/20 1406 05/20/20 0628 05/21/20 0722  WBC 19.9* 14.4* 8.7  HGB 15.9* 15.1* 13.6  HCT 46.2* 44.6 41.7  PLT 347 253 221   BMET Recent Labs    05/19/20 1406 05/20/20 0628 05/21/20 0722  NA 137 138 140  K 2.9* 3.3* 3.7  CL 103 102 105  CO2 20* 28 27  GLUCOSE 132* 107* 93  BUN 14 8 5*  CREATININE 1.20* 0.82 0.93  CALCIUM 10.0 8.8* 8.9   LFT Recent Labs    05/20/20 0628  PROT 6.4*  ALBUMIN 3.7  AST 14*  ALT 25  ALKPHOS 59  BILITOT 0.8  BILIDIR 0.2  IBILI 0.6   PT/INR No results for input(s): LABPROT, INR in the last 72 hours. Hepatitis Panel No results for input(s): HEPBSAG, HCVAB, HEPAIGM, HEPBIGM in the last 72 hours. C-Diff No results for input(s): CDIFFTOX in the last 72 hours.  Studies/Results: DG Chest Portable 1 View  Result Date: 05/19/2020 CLINICAL DATA:  Upper abdominal pain and vomiting. EXAM: PORTABLE CHEST 1 VIEW COMPARISON:  May 17, 2020 FINDINGS: There is no evidence of acute infiltrate, pleural effusion or pneumothorax. The heart size and mediastinal contours are within normal limits. The visualized skeletal structures are unremarkable. IMPRESSION: No active disease.  Electronically Signed   By: Virgina Norfolk M.D.   On: 05/19/2020 15:14    Impression: 29 year old lady with acute on chronic upper abdominal pain and blood tinged emesis.  Leukocytosis has resolved.  She has had a fairly extensive work-up to date.  She continues to take narcotics around-the-clock. She does not look acutely ill or toxic.  Hypokalemia improved with supplementation. At this point, I feel we need to proceed with an EGD to complete her inpatient evaluation.  Recommendations: I have offered the patient a diagnostic EGD tomorrow utilizing propofol for sedation.  The risks, benefits, limitations, alternatives and imponderables have been reviewed with the patient. Potential for esophageal dilation, biopsy, etc. have also been reviewed.  Questions have been answered.  Patient is agreeable.  Further recommendations to follow tomorrow pending findings of endoscopic evaluation.

## 2020-05-21 NOTE — Progress Notes (Signed)
Pt removed nicotine patch during night and states that she had a sharp pain at the site and began to itch. No rash noted, redness from scratching. Notified Dr. Olevia Bowens to make aware, new order to d/c nicotine patch. Marked as allergy.

## 2020-05-21 NOTE — Progress Notes (Signed)
PROGRESS NOTE  Debbie Santana GQQ:[id number] DOB: May 18, 1991 DOA: 05/19/2020 PCP: Patient, No Pcp Per  Brief History:  29 year old female with a history of anxiety/depression, reflux esophagitis/erosive gastritis, tobacco abuse, marijuana use presenting with 2-week history of upper abdominal pain isolated to the epigastric region.  The patient describes the pain as moderate and going straight through into her back.  She denies any alcohol or illegal drug use, but states that she had used marijuana on a daily basis, 2-3 times per day.  She stated that she quit using the marijuana approximately 6 to 7 days prior to this admission.  She denies any over-the-counter medications including NSAIDs.  Over the last 2 to 3 days, the patient has had worsening symptoms to the point where her abdominal pain has been constant where it was previously intermittent.  She was having difficulty tolerating her pills.  She had numerous episodes of emesis on 05/19/2020.  She stated that she began seeing some blood in her emesis during the latter episodes.  She had 1 bowel movement on 05/19/2020 without any hematochezia or melena.  She denies any new medications.  She denies any sick contacts, travels, or exotic or unusual foods.  She has had some dysuria without any hematuria.  She smokes 1/2 pack/day for the last 10 years.  She has had numerous ED visits because of abdominal pain most notably on 05/16/2020 as well as 05/18/2020.  She has an appointment to see [facility] on 06/15/2020.  05/18/2020 CT abdomen/pelvis showed fatty liver, right basilar atelectasis, but was otherwise unremarkable without any bowel wall thickening or pancreatic inflammation. In the emergency department, the patient was afebrile hemodynamically stable with oxygen saturation 100% room air.  BMP showed a serum creatinine of 1.20 and potassium 2.9.  LFTs were unremarkable with lipase 21.  WBC 19.9, hemoglobin 15.9, platelets 347,000.  Urine drug  screen was positive for THC, benzodiazepines, and opiates.  Urinalysis was negative for pyuria.  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 Meredeth Ide EGD in am -Lipase 21 -05/18/2020 CT abdomen--essentially negative -UA negative for pyuria -UDS--positive for THC, benzo, opiate -patient getting dilaudid IV around the clock, now asking for escalating dose  Dehydration -Continue IV fluids  Leukemoid reaction -Suspect stress demargination -Do not plan to start antibiotics presently -overall improved -Monitor patient clinically -personally reviewed CXR--no infiltrates or edema -UA--neg for pyuria  Anxiety/depression -Continue BuSpar, Atarax, Paxil, trazodone  Tobacco abuse -NicoDerm patch deferred -Cessation discussed  Morbid obesity -BMI 38.01 -Lifestyle modification  Hypokalemia -Replete -Magnesium 1.8 -Added potassium to IV fluids      Status is: Observation  The patient will require care spanning > 2 midnights and should be moved to inpatient because: IV treatments appropriate due to intensity of illness or inability to take PO  Dispo: The patient is from: Home  Anticipated d/c is to: Home  Anticipated d/c date is: 2 days  Patient currently is not medically stable to d/c.        Family Communication:   mother updated at bedside 8/29  Consultants:  GI  Code Status:  FULL   DVT Prophylaxis:  [place] Lovenox   Procedures: As Listed in Progress Note Above  Antibiotics:  None  Total time spent 35 minutes.  Greater than 50%  spent face to face counseling and coordinating care.     Subjective: Patient complains of dysuria and low back pain.  Denies f/c, cp, sob, vomiting.  She has nausea.  2 loose BM without hematochezia or melena.  Abd pain is a little better with IV opioid  Objective: Vitals:   05/20/20 0320 05/20/20 0649 05/20/20 2142 05/21/20 0618  BP: 121/75 116/84 120/77 112/82  Pulse: (!) 58 (!) 54 (!) 57 (!) 57  Resp: 18 18 18 18   Temp: 98.2 F (36.8 C) 98.5 F (36.9 C) 98 F (36.7 C) 97.7 F (36.5 C)  TempSrc: Oral Oral    SpO2: 98% 99% 99% 98%  Weight:      Height:        Intake/Output Summary (Last 24 hours) at 05/21/2020 1440 Last data filed at 05/21/2020 0300 Gross per 24 hour  Intake 1684.39 ml  Output --  Net 1684.39 ml   Weight change:  Exam:   General:  Pt is alert, follows commands appropriately, not in acute distress  HEENT: No icterus, No thrush, No neck mass, [place]/AT  Cardiovascular: RRR, S1/S2, no rubs, no gallops  Respiratory: CTA bilaterally, no wheezing, no crackles, no rhonchi  Abdomen: Soft/+BS, epigastric tender, non distended, no guarding  Extremities: No edema, No lymphangitis, No petechiae, No rashes, no synovitis   Data Reviewed: I have personally reviewed following labs and imaging studies Basic Metabolic Panel: Recent Labs  Lab 05/17/20 0128 05/18/20 1012 05/19/20 1406 05/20/20 0628 05/21/20 0722  NA 138 134* 137 138 140  K 2.9* 3.1* 2.9* 3.3* 3.7  CL 102 102 103 102 105  CO2 21* 22 20* 28 27  GLUCOSE 130* 107* 132* 107* 93  BUN 16 13 14 8  5*  CREATININE 1.07* 0.91 1.20* 0.82 0.93  CALCIUM 9.9 9.3 10.0 8.8* 8.9  MG  --   --  1.8  --  1.9   Liver Function Tests: Recent Labs  Lab 05/17/20 0128 05/18/20 1012 05/19/20 1406 05/20/20 0628  AST 17 22 22  14*  ALT 18 26 34 25  ALKPHOS 66 62 67 59  BILITOT 1.1 1.2 1.0 0.8  PROT 8.2* 7.3 7.4 6.4*  ALBUMIN 4.8 4.4 4.4 3.7   Recent Labs  Lab 05/17/20 0128 05/18/20 1012 05/19/20 1406   LIPASE 24 88* 21   No results for input(s): AMMONIA in the last 168 hours. Coagulation Profile: No results for input(s): INR, PROTIME in the last 168 hours. CBC: Recent Labs  Lab 05/17/20 0854 05/18/20 1012 05/19/20 1406 05/20/20 0628 05/21/20 0722  WBC 16.1* 16.2* 19.9* 14.4* 8.7  NEUTROABS  --   --  14.2*  --   --   HGB 14.7 14.8 15.9* 15.1* 13.6  HCT 42.6 42.6 46.2* 44.6 41.7  MCV 92.4 91.8 90.8 93.1 96.1  PLT 256 277 347 253 221   Cardiac Enzymes: No results for input(s): CKTOTAL, CKMB, CKMBINDEX, TROPONINI in the last 168 hours. BNP: Invalid input(s): POCBNP CBG: No results for input(s): GLUCAP in the last 168 hours. HbA1C: Recent Labs    05/19/20 1406  HGBA1C 5.2   Urine analysis:    Component Value Date/Time   COLORURINE YELLOW 05/18/2020 0934   APPEARANCEUR HAZY (A) 05/18/2020 0934   APPEARANCEUR Clear 11/04/2017 1159   LABSPEC 1.015 05/18/2020 0934   PHURINE 6.0 05/18/2020 [place] 05/18/2020 0934   HGBUR NEGATIVE 05/18/2020 0934   BILIRUBINUR NEGATIVE 05/18/2020  [place] Negative 11/04/2017 1159   KETONESUR 5 (A) 05/18/2020 0934   PROTEINUR 30 (A) 05/18/2020 0934   UROBILINOGEN 0.2 05/14/2015 0028   NITRITE NEGATIVE 05/18/2020 0934   LEUKOCYTESUR NEGATIVE 05/18/2020 0934   Sepsis Labs: @LABRCNTIP (procalcitonin:4,lacticidven:4) ) Recent Results (from the past 240 hour(s))  SARS Coronavirus 2 by RT PCR (hospital order, performed in [facility] hospital lab) Nasopharyngeal Nasopharyngeal Swab     Status: None   Collection Time: 05/17/20  3:23 AM   Specimen: Nasopharyngeal Swab  Result Value Ref Range Status   SARS Coronavirus 2 NEGATIVE NEGATIVE Final    Comment: (NOTE) SARS-CoV-2 target nucleic acids are NOT DETECTED.  The SARS-CoV-2 RNA is generally detectable in upper and lower respiratory specimens during the acute phase of infection. The lowest concentration of SARS-CoV-2 viral copies this assay can detect is  250 copies / mL. A negative result does not preclude SARS-CoV-2 infection and should not be used as the sole basis for treatment or other patient management decisions.  A negative result may occur with improper specimen collection / handling, submission of specimen other than nasopharyngeal swab, presence of viral mutation(s) within the areas targeted by this assay, and inadequate number of viral copies (<250 copies / mL). A negative result must be combined with clinical observations, patient history, and epidemiological information.  Fact Sheet for Patients:   StrictlyIdeas.no  Fact Sheet for Healthcare Providers: BankingDealers.co.za  This test is not yet approved or  cleared by the [place] FDA and has been authorized for detection and/or diagnosis of SARS-CoV-2 by FDA under an Emergency Use Authorization (EUA).  This EUA will remain in effect (meaning this test can be used) for the duration of the COVID-19 declaration under Section 564(b)(1) of the Act, 21 U.S.C. section 360bbb-3(b)(1), unless the authorization is terminated or revoked sooner.  Performed at [facility], [address], [place], [place] [address]   Urine culture     Status: Abnormal   Collection Time: 05/17/20  8:38 AM   Specimen: Urine, Random  Result Value Ref Range Status   Specimen Description   Final    URINE, RANDOM Performed at [facility], [address] [place], [place] [address]    Special Requests   Final    NONE Performed at [facility], [address] [place],  [address]    Culture MULTIPLE SPECIES PRESENT, SUGGEST RECOLLECTION (A)  Final   Report Status 05/18/2020 FINAL  Final  SARS Coronavirus 2 by RT PCR (hospital order, performed in [facility] hospital lab) Nasopharyngeal Nasopharyngeal Swab     Status: None   Collection Time: 05/19/20  5:01 PM   Specimen: Nasopharyngeal Swab  Result Value Ref Range Status   SARS Coronavirus 2  NEGATIVE NEGATIVE Final    Comment: (NOTE) SARS-CoV-2 target nucleic acids are NOT DETECTED.  The SARS-CoV-2 RNA is generally detectable in upper and lower respiratory specimens during the acute phase of infection. The lowest concentration of SARS-CoV-2 viral copies this assay can detect is 250 copies / mL. A negative result does not preclude SARS-CoV-2 infection and should not be used as the sole basis for treatment or other patient management decisions.  A negative result may occur with improper specimen collection / handling, submission of specimen other than nasopharyngeal swab, presence of viral mutation(s) within the areas targeted by this assay, and inadequate number of viral copies (<250 copies / mL). A negative result must be combined with clinical observations, patient history, and epidemiological information.  Fact Sheet for Patients:   StrictlyIdeas.no  Fact Sheet for Healthcare Providers: BankingDealers.co.za  This test is not yet approved or  cleared by the [place] FDA and has been authorized for detection and/or diagnosis of SARS-CoV-2 by FDA under an Emergency Use Authorization (EUA).  This EUA will remain in effect (meaning this test can be used) for the duration of the COVID-19 declaration under Section 564(b)(1) of the Act, 21 U.S.C. section 360bbb-3(b)(1), unless the authorization is terminated or revoked sooner.  Performed at [facility], [address] [place], [place] [address]      Scheduled Meds: . acetaminophen  1,000 mg Oral Once  . busPIRone  15 mg Oral BID  . dicyclomine  10 mg Oral TID AC & HS  . feeding supplement  1 Container Oral TID BM  . hydrOXYzine  25 mg Oral Q6H  . nicotine  14 mg Transdermal Daily  . ondansetron (ZOFRAN) IV  4 mg Intravenous Q6H  . pantoprazole (PROTONIX) IV  40 mg Intravenous Q12H  . PARoxetine  40 mg Oral Daily  . traZODone  50 mg Oral QHS   Continuous  Infusions: . 0.9 % NaCl with KCl 20 mEq / L 100 mL/hr at 05/21/20 8588    Procedures/Studies: CT ABDOMEN PELVIS W CONTRAST  Result Date: 05/18/2020 CLINICAL DATA:  Left-sided abdominal pain with nausea and vomiting EXAM: CT ABDOMEN AND PELVIS WITH CONTRAST TECHNIQUE: Multidetector CT imaging of the abdomen and pelvis was performed using the standard protocol following bolus administration of intravenous contrast. CONTRAST:  141mL OMNIPAQUE IOHEXOL 300 MG/ML  SOLN COMPARISON:  April 26, 2020 FINDINGS: Lower chest: There is mild atelectatic change in the right base. Lung bases otherwise are clear. Hepatobiliary: There is mild fatty infiltration near the fissure for the ligamentum teres. Beyond mild fatty infiltration, no focal liver lesions are evident. The gallbladder wall is not appreciably thickened. There is no intrahepatic biliary duct dilatation. The common hepatic duct measures 10 mm in diameter, upper normal for post cholecystectomy state. No obstructing lesion is seen by CT in the biliary ductal system. Pancreas: There is no appreciable pancreatic mass or inflammatory focus. Spleen: No splenic lesions are evident. Adrenals/Urinary Tract: Adrenals bilaterally appear normal. Kidneys bilaterally show no evident mass or hydronephrosis on either side. There is no appreciable renal or ureteral calculus on either side. Urinary bladder is midline with wall thickness within normal limits. Stomach/Bowel: There is no appreciable bowel wall or mesenteric thickening. The terminal ileum appears normal. There is a degree of fatty infiltration in the ileocecal valve. There is no evident bowel obstruction. There is no appreciable free air or portal venous air. Vascular/Lymphatic: There is no abdominal aortic aneurysm. No arterial vascular lesions are appreciable. Major venous structures appear patent. No evident adenopathy in the abdomen or pelvis. Reproductive: The uterus is anteverted. There is an apparent dominant  follicle in the right ovary measuring 2.2 x 1.9 cm. There is apparent cystic change in each vaginal labium. An apparent cystic area arising in the right vaginal labium measures 2.3 x 1.3 cm. A similar appearing area on the left measures 1.7 x 1.3 cm. The cystic areas presumably represent Bartholin's gland cysts. Other: The appendix appears normal. There is no evident abscess or ascites in the abdomen or pelvis. Musculoskeletal: No blastic or lytic bone lesions. There is no intramuscular or abdominal wall lesion evident. IMPRESSION: 1. A cause for patient's symptoms has not been established with this study. 2. No bowel wall thickening or bowel obstruction.  No abscess in the abdomen or pelvis. Appendix appears normal. 3. Stable apparent bilateral Bartholin's gland cysts in the vaginal labia. 4. No renal or ureteral calculi. No hydronephrosis. Urinary bladder wall thickness normal. 5. Gallbladder absent. Common hepatic duct upper normal in size. No biliary ductal obstruction evident by CT. Electronically Signed   By: Lowella Grip III M.D.   On: 05/18/2020 16:49   DG Chest Portable 1 View  Result Date: 05/19/2020 CLINICAL DATA:  Upper abdominal pain and vomiting. EXAM: PORTABLE CHEST 1 VIEW COMPARISON:  May 17, 2020 FINDINGS: There is no evidence of acute infiltrate, pleural effusion or pneumothorax. The heart size and mediastinal contours are within normal limits. The visualized skeletal structures are unremarkable. IMPRESSION: No active disease. Electronically Signed   By: Virgina Norfolk M.D.   On: 05/19/2020 15:14   DG Chest Port 1 View  Result Date: 05/17/2020 CLINICAL DATA:  29 year old female with cough. EXAM: PORTABLE CHEST 1 VIEW COMPARISON:  Chest radiograph dated 02/23/2020. FINDINGS: The heart size and mediastinal contours are within normal limits. Both lungs are clear. The visualized skeletal structures are unremarkable. IMPRESSION: No active disease. Electronically Signed   By: Anner Crete M.D.   On: 05/17/2020 03:15    Orson Eva, DO  [facility]  If 7PM-7AM, please contact night-coverage [email or web address] Password [email or web address] 05/21/2020, 2:40 PM   LOS: 1 day

## 2020-05-21 NOTE — Progress Notes (Signed)
Initial Nutrition Assessment  DOCUMENTATION CODES:   Obesity unspecified  INTERVENTION:    Boost Breeze po TID, each supplement provides 250 kcal and 9 grams of protein  MVI daily   NUTRITION DIAGNOSIS:   Inadequate oral intake related to nausea, vomiting as evidenced by per patient/family report.  GOAL:   Patient will meet greater than or equal to 90% of their needs  MONITOR:   PO intake, Supplement acceptance, Weight trends, Diet advancement, Labs, I & O's  REASON FOR ASSESSMENT:   Malnutrition Screening Tool    ASSESSMENT:   Patient with PMH significant for GERD, PUD, and chronic back pain. Presents this admission with gastroenteritis.   Plan EGD tomorrow.   Py endorses loss in appetite over the last week due to nausea/vomiting. States she could not tolerate any food or liquids. Prior to this she consumed three meals daily with good protein sources. Pt tolerating liquids. Having small amount of abdominal pain. Wanting to have regular textured foods. Discussed the importance of protein intake for preservation of lean body mass. Pt willing to try Boost Breeze.   Pt reports a UBW of 250 lb and denies wt loss. Records show stated weight of 250 lb this admission. Will need to obtain actual weight to assess for weight loss.   Drips: NS with 20 mEq @ 100 ml/hr  Medications: zofran Labs: reviewed   Diet Order:   Diet Order            Diet clear liquid Room service appropriate? Yes; Fluid consistency: Thin  Diet effective now                 EDUCATION NEEDS:   Not appropriate for education at this time  Skin:  Skin Assessment: Reviewed RN Assessment  Last BM:  8/26  Height:   Ht Readings from Last 1 Encounters:  05/19/20 5\' 8"  (1.727 m)    Weight:   Wt Readings from Last 1 Encounters:  05/19/20 113.4 kg    BMI:  Body mass index is 38.01 kg/m.  Estimated Nutritional Needs:   Kcal:  1800-2000 kcal  Protein:  90-105 grams  Fluid:  >/= 1.8  L/day   Mariana Single RD, LDN Clinical Nutrition Pager listed in [place]

## 2020-05-22 ENCOUNTER — Encounter (HOSPITAL_COMMUNITY): Payer: Self-pay | Admitting: Internal Medicine

## 2020-05-22 ENCOUNTER — Telehealth: Payer: Self-pay | Admitting: *Deleted

## 2020-05-22 ENCOUNTER — Inpatient Hospital Stay (HOSPITAL_COMMUNITY): Payer: Medicaid Other | Admitting: Anesthesiology

## 2020-05-22 ENCOUNTER — Telehealth: Payer: Self-pay

## 2020-05-22 ENCOUNTER — Other Ambulatory Visit: Payer: Self-pay

## 2020-05-22 ENCOUNTER — Encounter (HOSPITAL_COMMUNITY)
Admission: EM | Disposition: A | Discharge status: Home / Self Care | Payer: Self-pay | Source: Home / Self Care | Attending: Internal Medicine

## 2020-05-22 DIAGNOSIS — K920 Hematemesis: Secondary | ICD-10-CM

## 2020-05-22 DIAGNOSIS — R1013 Epigastric pain: Secondary | ICD-10-CM

## 2020-05-22 DIAGNOSIS — E860 Dehydration: Secondary | ICD-10-CM

## 2020-05-22 HISTORY — PX: ESOPHAGOGASTRODUODENOSCOPY (EGD) WITH PROPOFOL: SHX5813

## 2020-05-22 HISTORY — PX: BIOPSY: SHX5522

## 2020-05-22 LAB — BASIC METABOLIC PANEL
Anion gap: 7 (ref 5–15)
BUN: <5 mg/dL — ABNORMAL LOW (ref 6–20)
CO2: 28 mmol/L (ref 22–32)
Calcium: 8.8 mg/dL — ABNORMAL LOW (ref 8.9–10.3)
Chloride: 105 mmol/L (ref 98–111)
Creatinine, Ser: 0.93 mg/dL (ref 0.44–1.00)
GFR calc Af Amer: >60 mL/min (ref >60)
GFR calc non Af Amer: >60 mL/min (ref >60)
Glucose, Bld: 77 mg/dL (ref 70–99)
Potassium: 3.4 mmol/L — ABNORMAL LOW (ref 3.5–5.1)
Sodium: 140 mmol/L (ref 135–145)

## 2020-05-22 LAB — CBC
HCT: 40.9 % (ref 36.0–46.0)
Hemoglobin: 13.2 g/dL (ref 12.0–15.0)
MCH: 31.3 pg (ref 26.0–34.0)
MCHC: 32.3 g/dL (ref 30.0–36.0)
MCV: 96.9 fL (ref 80.0–100.0)
Platelets: 236 10*3/uL (ref 150–400)
RBC: 4.22 MIL/uL (ref 3.87–5.11)
RDW: 12.5 % (ref 11.5–15.5)
WBC: 10.4 10*3/uL (ref 4.0–10.5)
nRBC: 0 % (ref 0.0–0.2)

## 2020-05-22 LAB — MAGNESIUM: Magnesium: 1.9 mg/dL (ref 1.7–2.4)

## 2020-05-22 SURGERY — ESOPHAGOGASTRODUODENOSCOPY (EGD) WITH PROPOFOL
Anesthesia: General

## 2020-05-22 MED ORDER — STERILE WATER FOR IRRIGATION IR SOLN
Status: DC | PRN
  Administered 2020-05-22: 1.5 mL

## 2020-05-22 MED ORDER — PROPOFOL 500 MG/50ML IV EMUL
INTRAVENOUS | Status: DC | PRN
  Administered 2020-05-22: 150 ug/kg/min via INTRAVENOUS

## 2020-05-22 MED ORDER — OXYCODONE HCL 5 MG PO TABS: 5.0000 mg | ORAL_TABLET | Freq: Four times a day (QID) | ORAL | Status: DC | PRN

## 2020-05-22 MED ORDER — PANTOPRAZOLE SODIUM 40 MG PO TBEC: 40.0000 mg | DELAYED_RELEASE_TABLET | Freq: Two times a day (BID) | ORAL | Status: DC

## 2020-05-22 MED ORDER — LACTATED RINGERS IV SOLN: INTRAVENOUS | Status: DC | PRN

## 2020-05-22 MED ORDER — LIDOCAINE VISCOUS HCL 2 % MT SOLN
15.0000 mL | Freq: Once | OROMUCOSAL | Status: AC
  Administered 2020-05-22: 15 mL via OROMUCOSAL

## 2020-05-22 MED ORDER — SODIUM CHLORIDE 0.9 % IV SOLN: INTRAVENOUS | Status: DC

## 2020-05-22 MED ORDER — LIDOCAINE VISCOUS HCL 2 % MT SOLN
OROMUCOSAL | Status: AC
  Filled 2020-05-22: qty 15

## 2020-05-22 MED ORDER — PROPOFOL 10 MG/ML IV BOLUS
INTRAVENOUS | Status: DC | PRN
  Administered 2020-05-22 (×3): 20 mg via INTRAVENOUS

## 2020-05-22 MED ORDER — LACTATED RINGERS IV SOLN: Freq: Once | INTRAVENOUS | Status: AC

## 2020-05-22 MED ORDER — OXYCODONE HCL 5 MG PO TABS
5.0000 mg | ORAL_TABLET | Freq: Four times a day (QID) | ORAL | 0 refills | Status: DC | PRN
Start: 2020-05-22 — End: 2020-06-15

## 2020-05-22 MED ORDER — PANTOPRAZOLE SODIUM 40 MG PO TBEC
40.0000 mg | DELAYED_RELEASE_TABLET | Freq: Two times a day (BID) | ORAL | 1 refills | Status: DC
Start: 2020-05-22 — End: 2020-08-10

## 2020-05-22 NOTE — Telephone Encounter (Signed)
Patient seen in ED 05/17/20 and 05/18/20. Patient admitted to [facility] on 05/19/20. Order placed for [place] for completion of transition of care assessment.  Lenor Coffin, RN, BSN, [place] Patient [place] [phone]

## 2020-05-22 NOTE — Anesthesia Preprocedure Evaluation (Signed)
Anesthesia Evaluation  Patient identified by MRN, date of birth, ID band Patient awake    Reviewed: Allergy & Precautions, NPO status , Patient's Chart, lab work & pertinent test results  Airway Mallampati: II  TM Distance: >3 FB Neck ROM: Full    Dental  (+) Missing, Dental Advisory Given, Chipped   Pulmonary Current Smoker and Patient abstained from smoking.,    Pulmonary exam normal breath sounds clear to auscultation       Cardiovascular hypertension, Normal cardiovascular exam Rhythm:Regular Rate:Normal     Neuro/Psych  Headaches, PSYCHIATRIC DISORDERS Anxiety Depression  Neuromuscular disease    GI/Hepatic PUD, GERD  Medicated and Controlled,  Endo/Other    Renal/GU      Musculoskeletal   Abdominal   Peds  Hematology   Anesthesia Other Findings   Reproductive/Obstetrics                             Anesthesia Physical Anesthesia Plan  ASA: II  Anesthesia Plan: General   Post-op Pain Management:    Induction:   PONV Risk Score and Plan: TIVA  Airway Management Planned: Nasal Cannula and Natural Airway  Additional Equipment:   Intra-op Plan:   Post-operative Plan:   Informed Consent: I have reviewed the patients History and Physical, chart, labs and discussed the procedure including the risks, benefits and alternatives for the proposed anesthesia with the patient or authorized representative who has indicated his/her understanding and acceptance.     Dental advisory given  Plan Discussed with: CRNA and Surgeon  Anesthesia Plan Comments:         Anesthesia Quick Evaluation

## 2020-05-22 NOTE — Op Note (Signed)
[facility] Patient Name: Debbie Santana Procedure Date: 05/22/2020 11:03 AM MRN: [id number] Date of Birth: [birth year] Attending MD: Norvel Richards , MD CSN: [id number] Age: 29 Admit Type: Inpatient Procedure:                Upper GI endoscopy Indications:              Hematemesis, Nausea with vomiting Providers:                Norvel Richards, MD, Janeece Riggers, RN, Lambert Mody, Randa Spike, Technician Referring MD:              Medicines:                Propofol per Anesthesia Complications:            No immediate complications. Estimated Blood Loss:     Estimated blood loss was minimal. Procedure:                Pre-Anesthesia Assessment:                           - Prior to the procedure, a History and Physical                            was performed, and patient medications and                            allergies were reviewed. The patient's tolerance of                            previous anesthesia was also reviewed. The risks                            and benefits of the procedure and the sedation                            options and risks were discussed with the patient.                            All questions were answered, and informed consent                            was obtained. Prior Anticoagulants: The patient has                            taken no previous anticoagulant or antiplatelet                            agents. ASA Grade Assessment: II - A patient with                            mild systemic disease. After reviewing the risks  and benefits, the patient was deemed in                            satisfactory condition to undergo the procedure.                           After obtaining informed consent, the endoscope was                            passed under direct vision. Throughout the                            procedure, the patient's blood pressure, pulse, and                             oxygen saturations were monitored continuously. The                            GIF-H190 ([id number]) scope was introduced through the                            mouth, and advanced to the second part of duodenum.                            The upper GI endoscopy was accomplished without                            difficulty. The patient tolerated the procedure                            well. Scope In: 11:28:10 AM Scope Out: 11:33:23 AM Total Procedure Duration: 0 hours 5 minutes 13 seconds  Findings:      1.5 cm linear erosion straddling the GE junction consistent with reflux       related injury. No Barrett's epithelium or other abnormality seen.       Stomach empty: Antral and body erosions. No ulcer or infiltrating       process. Patent pylorus.      Biopsies of the antrum and body were taken for histologic study.      The duodenal bulb and second portion of the duodenum were normal. Impression:               -Erosive reflux esophagitis. Gastric                            erosions?"status post biopsy; normal duodenal bulb                            and second portion of the duodenum.                           -Clinically, she is much improved. Poorly                            controlled reflux lately. Cannot rule out recent  bout of viral gastroenteritis. NSAID use may have                            exacerbated. Moderate Sedation:      Moderate (conscious) sedation was personally administered by an       anesthesia professional. The following parameters were monitored: oxygen       saturation, heart rate, blood pressure, respiratory rate, EKG, adequacy       of pulmonary ventilation, and response to care. Recommendation:           - - Return patient to hospital ward for possible                            discharge same day.                           - Advance diet as tolerated. Discontinue IV                            Protonix; Protonix 40  mg twice daily x2 weeks; then                            40 mg once daily thereafter. Minimize use of                            nonsteroidal agents moving forward. Follow-up on                            pathology. If she tolerates her diet today, from a                            GI standpoint, could be discharged. I recommend she                            follow up with Dr. Jenetta Downer next month as                            scheduled. I called Lauraine Rinne, mother, patient                            request?"[phone]. I reviewed my findings and                            recommendations. Procedure Code(s):        --- Professional ---                           [phone], Esophagogastroduodenoscopy, flexible,                            transoral; diagnostic, including collection of                            specimen(s) by brushing or washing, when performed                            (  separate procedure) Diagnosis Code(s):        --- Professional ---                           K92.0, Hematemesis                           R11.2, Nausea with vomiting, unspecified CPT copyright 2019 [employer]. All rights reserved. The codes documented in this report are preliminary and upon coder review may  be revised to meet current compliance requirements. Cristopher Estimable. Jecenia Leamer, MD Norvel Richards, MD 05/22/2020 11:47:32 AM This report has been signed electronically. Number of Addenda: 0

## 2020-05-22 NOTE — Transfer of Care (Signed)
Immediate Anesthesia Transfer of Care Note  Patient: Debbie Santana  Procedure(s) Performed: ESOPHAGOGASTRODUODENOSCOPY (EGD) WITH PROPOFOL (N/A ) BIOPSY  Patient Location: PACU  Anesthesia Type:General  Level of Consciousness: awake  Airway & Oxygen Therapy: Patient Spontanous Breathing  Post-op Assessment: Report given to RN  Post vital signs: Reviewed  Last Vitals:  Vitals Value Taken Time  BP 101/49 05/22/20 1138  Temp    Pulse 56 05/22/20 1139  Resp 13 05/22/20 1139  SpO2 98 % 05/22/20 1139  Vitals shown include unvalidated device data.  Last Pain:  Vitals:   05/22/20 1123  TempSrc:   PainSc: 0-No pain      Patients Stated Pain Goal: 1 (29/19/16 6060)  Complications: No complications documented.

## 2020-05-22 NOTE — Discharge Summary (Signed)
Physician Discharge Summary  Debbie Santana GTX:[id number] DOB: [birth year] DOA: 05/19/2020  PCP: Patient, No Pcp Per  Admit date: 05/19/2020 Discharge date: 05/22/2020  Admitted From: Home Disposition:  Home   Recommendations for Outpatient Follow-up:  1. Follow up with PCP in 1-2 weeks 2. Please obtain BMP/CBC in one week    Discharge Condition: Stable CODE STATUS: FULL Diet recommendation: soft   Brief/Interim Summary: 29 year old female with a history of anxiety/depression, reflux esophagitis/erosive gastritis, tobacco abuse, marijuana use presenting with 2-week history of upper abdominal pain isolated to the epigastric region. The patient describes the pain as moderate and going straight through into her back. She denies any alcohol or illegal drug use, but states that she had used marijuana on a daily basis, 2-3 times per day. She stated that she quit using the marijuana approximately 6 to 7 days prior to this admission. She denies any over-the-counter medications including NSAIDs. Over the last 2 to 3 days, the patient has had worsening symptoms to the point where her abdominal pain has been constant where it was previously intermittent. She was having difficulty tolerating her pills. She had numerous episodes of emesis on 05/19/2020. She stated that she began seeing some blood in her emesis during the latter episodes. She had 1 bowel movement on 05/19/2020 without any hematochezia or melena. She denies any new medications. She denies any sick contacts, travels, or exotic or unusual foods. She has had some dysuria without any hematuria. She smokes 1/2 pack/day for the last 10 years. She has had numerous ED visits because of abdominal pain most notably on 05/16/2020 as well as 05/18/2020. She has an appointment to see [facility] on 06/15/2020. 05/18/2020 CT abdomen/pelvis showed fatty liver, right basilar atelectasis, but was otherwise unremarkable without any bowel wall  thickening or pancreatic inflammation. In the emergency department, the patient was afebrile hemodynamically stable with oxygen saturation 100% room air. BMP showed a serum creatinine of 1.20 and potassium 2.9. LFTs were unremarkable with lipase 21. WBC 19.9, hemoglobin 15.9, platelets 347,000. Urine drug screen was positive for THC, benzodiazepines, and opiates. Urinalysis was negative for pyuria.  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 Meredeth Ide EGD 8/30 -Lipase 21 -05/18/2020 CT abdomen--essentially negative -UA negative for pyuria -UDS--positive for THC, benzo, opiate -8/30 EGD--Erosive reflux esophagitis. Gastric erosions?status post biopsy; normal duodenal bulb and second portion of the duodenum; symptoms exacerbated by recent NSAID use -patient's diet was advanced after EGD and she tolerated although she continued to have some epigastric pain  Dehydration -Continued IV fluids-->improved  Leukemoid reaction -Suspect stress demargination -Do not plan to start antibiotics presently -overall improved -Monitor patient clinically--afebrile and hemodynamically stable -personally reviewed CXR--no infiltrates or edema -UA--neg for pyuria  Anxiety/depression -Continue BuSpar, Atarax, Paxil, trazodone  Tobacco abuse -NicoDerm patch deferred -Cessation discussed  Morbid obesity -BMI 38.01 -Lifestyle modification  Hypokalemia -Repleted -Magnesium 1.8 -Added potassium to IV fluids   Discharge Instructions   Allergies as of 05/22/2020      Reactions   Nicoderm [nicotine] Itching   Sharp pain at site      Medication List    STOP taking these  medications   fluocinonide-emollient 0.05 % cream Commonly known as: LIDEX-E   omeprazole 20 MG capsule Commonly known as: PRILOSEC     TAKE these  medications   busPIRone 7.5 MG tablet Commonly known as: BUSPAR Take 2 tablets by mouth twice daily   dicyclomine 10 MG capsule Commonly known as: Bentyl Take 1 capsule (10 mg total) by mouth 4 (four) times daily -  before meals and at bedtime.   hydrOXYzine 25 MG tablet Commonly known as: ATARAX/VISTARIL Take 1 tablet (25 mg total) by mouth every 6 (six) hours.   Nexplanon 68 MG Impl implant Generic drug: etonogestrel 1 each by Subdermal route once.   ondansetron 4 MG disintegrating tablet Commonly known as: Zofran ODT Take 1 tablet (4 mg total) by mouth every 8 (eight) hours as needed for nausea or vomiting.   ondansetron 4 MG tablet Commonly known as: ZOFRAN Take 1 tablet (4 mg total) by mouth every 6 (six) hours.   oxyCODONE 5 MG immediate release tablet Commonly known as: Oxy IR/ROXICODONE Take 1 tablet (5 mg total) by mouth every 6 (six) hours as needed for moderate pain.   pantoprazole 40 MG tablet Commonly known as: PROTONIX Take 1 tablet (40 mg total) by mouth 2 (two) times daily before a meal.   PARoxetine 40 MG tablet Commonly known as: PAXIL Take 1 tablet (40 mg total) by mouth every morning.   potassium chloride SA 20 MEQ tablet Commonly known as: KLOR-CON Take 1 tablet (20 mEq total) by mouth 2 (two) times daily.   promethazine 25 MG tablet Commonly known as: PHENERGAN TAKE 1 TABLET BY MOUTH EVERY 6 HOURS AS NEEDED FOR NAUSEA AND VOMITING What changed: See the new instructions.   rizatriptan 10 MG tablet Commonly known as: MAXALT TAKE 1 TABLET BY MOUTH ONCE DAILY AS NEEDED FOR  MIGRAINE,  MAY  REPEAT  IN  2  HOURS  IF  NEEDED.   traZODone 50 MG tablet Commonly known as: DESYREL TAKE 1 TABLET BY MOUTH AT BEDTIME       Allergies  Allergen Reactions  . Nicoderm [Nicotine] Itching    Sharp pain  at site    Consultations:  GI   Procedures/Studies: CT ABDOMEN PELVIS W CONTRAST  Result Date: 05/18/2020 CLINICAL DATA:  Left-sided abdominal pain with nausea and vomiting EXAM: CT ABDOMEN AND PELVIS WITH CONTRAST TECHNIQUE: Multidetector CT imaging of the abdomen and pelvis was performed using the standard protocol following bolus administration of intravenous contrast. CONTRAST:  158mL OMNIPAQUE IOHEXOL 300 MG/ML  SOLN COMPARISON:  April 26, 2020 FINDINGS: Lower chest: There is mild atelectatic change in the right base. Lung bases otherwise are clear. Hepatobiliary: There is mild fatty infiltration near the fissure for the ligamentum teres. Beyond mild fatty infiltration, no focal liver lesions are evident. The gallbladder wall is not appreciably thickened. There is no intrahepatic biliary duct dilatation. The common hepatic duct measures 10 mm in diameter, upper normal for post cholecystectomy state. No obstructing lesion is seen by CT in the biliary ductal system. Pancreas: There is no appreciable pancreatic mass or inflammatory focus. Spleen: No splenic lesions are evident. Adrenals/Urinary Tract: Adrenals bilaterally appear normal. Kidneys bilaterally show no evident mass or hydronephrosis on either side. There is no appreciable renal or ureteral calculus on either side. Urinary bladder is midline with wall thickness within normal limits. Stomach/Bowel: There is no appreciable bowel wall or mesenteric thickening. The terminal ileum appears normal. There is a degree of fatty infiltration in the ileocecal valve. There is no evident bowel obstruction. There is no appreciable free air or portal venous air. Vascular/Lymphatic: There is no abdominal aortic aneurysm. No arterial vascular lesions  are appreciable. Major venous structures appear patent. No evident adenopathy in the abdomen or pelvis. Reproductive: The uterus is anteverted. There is an apparent dominant follicle in the right ovary measuring  2.2 x 1.9 cm. There is apparent cystic change in each vaginal labium. An apparent cystic area arising in the right vaginal labium measures 2.3 x 1.3 cm. A similar appearing area on the left measures 1.7 x 1.3 cm. The cystic areas presumably represent Bartholin's gland cysts. Other: The appendix appears normal. There is no evident abscess or ascites in the abdomen or pelvis. Musculoskeletal: No blastic or lytic bone lesions. There is no intramuscular or abdominal wall lesion evident. IMPRESSION: 1. A cause for patient's symptoms has not been established with this study. 2. No bowel wall thickening or bowel obstruction. No abscess in the abdomen or pelvis. Appendix appears normal. 3. Stable apparent bilateral Bartholin's gland cysts in the vaginal labia. 4. No renal or ureteral calculi. No hydronephrosis. Urinary bladder wall thickness normal. 5. Gallbladder absent. Common hepatic duct upper normal in size. No biliary ductal obstruction evident by CT. Electronically Signed   By: Lowella Grip III M.D.   On: 05/18/2020 16:49   DG Chest Portable 1 View  Result Date: 05/19/2020 CLINICAL DATA:  Upper abdominal pain and vomiting. EXAM: PORTABLE CHEST 1 VIEW COMPARISON:  May 17, 2020 FINDINGS: There is no evidence of acute infiltrate, pleural effusion or pneumothorax. The heart size and mediastinal contours are within normal limits. The visualized skeletal structures are unremarkable. IMPRESSION: No active disease. Electronically Signed   By: Virgina Norfolk M.D.   On: 05/19/2020 15:14   DG Chest Port 1 View  Result Date: 05/17/2020 CLINICAL DATA:  29 year old female with cough. EXAM: PORTABLE CHEST 1 VIEW COMPARISON:  Chest radiograph dated 02/23/2020. FINDINGS: The heart size and mediastinal contours are within normal limits. Both lungs are clear. The visualized skeletal structures are unremarkable. IMPRESSION: No active disease. Electronically Signed   By: Anner Crete M.D.   On: 05/17/2020 03:15          Discharge Exam: Vitals:   05/22/20 1140 05/22/20 1145  BP: (!) 101/49 (!) 97/48  Pulse: (!) 58 (!) 51  Resp: 15 20  Temp: 98.4 F (36.9 C)   SpO2: 97% 97%   Vitals:   05/22/20 1038 05/22/20 1041 05/22/20 1140 05/22/20 1145  BP:  109/76 (!) 101/49 (!) 97/48  Pulse:  (!) 59 (!) 58 (!) 51  Resp:  15 15 20   Temp: 98.8 F (37.1 C)  98.4 F (36.9 C)   TempSrc: Oral     SpO2:  96% 97% 97%  Weight:      Height:        General: Pt is alert, awake, not in acute distress Cardiovascular: RRR, S1/S2 +, no rubs, no gallops Respiratory: CTA bilaterally, no wheezing, no rhonchi Abdominal: Soft, epigastric tender, ND, bowel sounds + Extremities: no edema, no cyanosis   The results of significant diagnostics from this hospitalization (including imaging, microbiology, ancillary and laboratory) are listed below for reference.    Significant Diagnostic Studies: CT ABDOMEN PELVIS W CONTRAST  Result Date: 05/18/2020 CLINICAL DATA:  Left-sided abdominal pain with nausea and vomiting EXAM: CT ABDOMEN AND PELVIS WITH CONTRAST TECHNIQUE: Multidetector CT imaging of the abdomen and pelvis was performed using the standard protocol following bolus administration of intravenous contrast. CONTRAST:  132mL OMNIPAQUE IOHEXOL 300 MG/ML  SOLN COMPARISON:  April 26, 2020 FINDINGS: Lower chest: There is mild atelectatic change in the right  base. Lung bases otherwise are clear. Hepatobiliary: There is mild fatty infiltration near the fissure for the ligamentum teres. Beyond mild fatty infiltration, no focal liver lesions are evident. The gallbladder wall is not appreciably thickened. There is no intrahepatic biliary duct dilatation. The common hepatic duct measures 10 mm in diameter, upper normal for post cholecystectomy state. No obstructing lesion is seen by CT in the biliary ductal system. Pancreas: There is no appreciable pancreatic mass or inflammatory focus. Spleen: No splenic lesions are evident.  Adrenals/Urinary Tract: Adrenals bilaterally appear normal. Kidneys bilaterally show no evident mass or hydronephrosis on either side. There is no appreciable renal or ureteral calculus on either side. Urinary bladder is midline with wall thickness within normal limits. Stomach/Bowel: There is no appreciable bowel wall or mesenteric thickening. The terminal ileum appears normal. There is a degree of fatty infiltration in the ileocecal valve. There is no evident bowel obstruction. There is no appreciable free air or portal venous air. Vascular/Lymphatic: There is no abdominal aortic aneurysm. No arterial vascular lesions are appreciable. Major venous structures appear patent. No evident adenopathy in the abdomen or pelvis. Reproductive: The uterus is anteverted. There is an apparent dominant follicle in the right ovary measuring 2.2 x 1.9 cm. There is apparent cystic change in each vaginal labium. An apparent cystic area arising in the right vaginal labium measures 2.3 x 1.3 cm. A similar appearing area on the left measures 1.7 x 1.3 cm. The cystic areas presumably represent Bartholin's gland cysts. Other: The appendix appears normal. There is no evident abscess or ascites in the abdomen or pelvis. Musculoskeletal: No blastic or lytic bone lesions. There is no intramuscular or abdominal wall lesion evident. IMPRESSION: 1. A cause for patient's symptoms has not been established with this study. 2. No bowel wall thickening or bowel obstruction. No abscess in the abdomen or pelvis. Appendix appears normal. 3. Stable apparent bilateral Bartholin's gland cysts in the vaginal labia. 4. No renal or ureteral calculi. No hydronephrosis. Urinary bladder wall thickness normal. 5. Gallbladder absent. Common hepatic duct upper normal in size. No biliary ductal obstruction evident by CT. Electronically Signed   By: Lowella Grip III M.D.   On: 05/18/2020 16:49   DG Chest Portable 1 View  Result Date: 05/19/2020 CLINICAL  DATA:  Upper abdominal pain and vomiting. EXAM: PORTABLE CHEST 1 VIEW COMPARISON:  May 17, 2020 FINDINGS: There is no evidence of acute infiltrate, pleural effusion or pneumothorax. The heart size and mediastinal contours are within normal limits. The visualized skeletal structures are unremarkable. IMPRESSION: No active disease. Electronically Signed   By: Virgina Norfolk M.D.   On: 05/19/2020 15:14   DG Chest Port 1 View  Result Date: 05/17/2020 CLINICAL DATA:  29 year old female with cough. EXAM: PORTABLE CHEST 1 VIEW COMPARISON:  Chest radiograph dated 02/23/2020. FINDINGS: The heart size and mediastinal contours are within normal limits. Both lungs are clear. The visualized skeletal structures are unremarkable. IMPRESSION: No active disease. Electronically Signed   By: Anner Crete M.D.   On: 05/17/2020 03:15     Microbiology: Recent Results (from the past 240 hour(s))  SARS Coronavirus 2 by RT PCR (hospital order, performed in [facility] hospital lab) Nasopharyngeal Nasopharyngeal Swab     Status: None   Collection Time: 05/17/20  3:23 AM   Specimen: Nasopharyngeal Swab  Result Value Ref Range Status   SARS Coronavirus 2 NEGATIVE NEGATIVE Final    Comment: (NOTE) SARS-CoV-2 target nucleic acids are NOT DETECTED.  The SARS-CoV-2  RNA is generally detectable in upper and lower respiratory specimens during the acute phase of infection. The lowest concentration of SARS-CoV-2 viral copies this assay can detect is 250 copies / mL. A negative result does not preclude SARS-CoV-2 infection and should not be used as the sole basis for treatment or other patient management decisions.  A negative result may occur with improper specimen collection / handling, submission of specimen other than nasopharyngeal swab, presence of viral mutation(s) within the areas targeted by this assay, and inadequate number of viral copies (<250 copies / mL). A negative result must be combined with  clinical observations, patient history, and epidemiological information.  Fact Sheet for Patients:   StrictlyIdeas.no  Fact Sheet for Healthcare Providers: BankingDealers.co.za  This test is not yet approved or  cleared by the [place] FDA and has been authorized for detection and/or diagnosis of SARS-CoV-2 by FDA under an Emergency Use Authorization (EUA).  This EUA will remain in effect (meaning this test can be used) for the duration of the COVID-19 declaration under Section 564(b)(1) of the Act, 21 U.S.C. section 360bbb-3(b)(1), unless the authorization is terminated or revoked sooner.  Performed at [facility], [address], [place], [place] [address]   Urine culture     Status: Abnormal   Collection Time: 05/17/20  8:38 AM   Specimen: Urine, Random  Result Value Ref Range Status   Specimen Description   Final    URINE, RANDOM Performed at [facility], [address] [place], [place] [address]    Special Requests   Final    NONE Performed at [facility], [address], [place], [place] [address]    Culture MULTIPLE SPECIES PRESENT, SUGGEST RECOLLECTION (A)  Final   Report Status 05/18/2020 FINAL  Final  SARS Coronavirus 2 by RT PCR (hospital order, performed in [facility] hospital lab) Nasopharyngeal Nasopharyngeal Swab     Status: None   Collection Time: 05/19/20  5:01 PM   Specimen: Nasopharyngeal Swab  Result Value Ref Range Status   SARS Coronavirus 2 NEGATIVE NEGATIVE Final    Comment: (NOTE) SARS-CoV-2 target nucleic acids are NOT DETECTED.  The SARS-CoV-2 RNA is generally detectable in upper and lower respiratory specimens during the acute phase of infection. The lowest concentration of SARS-CoV-2 viral copies this assay can detect is 250 copies / mL. A negative result does not preclude SARS-CoV-2 infection and should not be used as the sole basis for treatment or other patient management decisions.  A  negative result may occur with improper specimen collection / handling, submission of specimen other than nasopharyngeal swab, presence of viral mutation(s) within the areas targeted by this assay, and inadequate number of viral copies (<250 copies / mL). A negative result must be combined with clinical observations, patient history, and epidemiological information.  Fact Sheet for Patients:   StrictlyIdeas.no  Fact Sheet for Healthcare Providers: BankingDealers.co.za  This test is not yet approved or  cleared by the [place] FDA and has been authorized for detection and/or diagnosis of SARS-CoV-2 by FDA under an Emergency Use Authorization (EUA).  This EUA will remain in effect (meaning this test can be used) for the duration of the COVID-19 declaration under Section 564(b)(1) of the Act, 21 U.S.C. section 360bbb-3(b)(1), unless the authorization is terminated or revoked sooner.  Performed at [facility], [address] [place],  [address]      Labs: Basic Metabolic Panel: Recent Labs  Lab 05/18/20 1012 05/18/20 1012 05/19/20 1406 05/19/20 1406  05/20/20 9179 05/20/20 1505 05/21/20 0722 05/22/20 0610  NA 134*  --  137  --  138  --  140 140  K 3.1*   < > 2.9*   < > 3.3*   < > 3.7 3.4*  CL 102  --  103  --  102  --  105 105  CO2 22  --  20*  --  28  --  27 28  GLUCOSE 107*  --  132*  --  107*  --  93 77  BUN 13  --  14  --  8  --  5* <5*  CREATININE 0.91  --  1.20*  --  0.82  --  0.93 0.93  CALCIUM 9.3  --  10.0  --  8.8*  --  8.9 8.8*  MG  --   --  1.8  --   --   --  1.9 1.9   < > = values in this interval not displayed.   Liver Function Tests: Recent Labs  Lab 05/17/20 0128 05/18/20 1012 05/19/20 1406 05/20/20 0628  AST 17 22 22  14*  ALT 18 26 34 25  ALKPHOS 66 62 67 59  BILITOT 1.1 1.2 1.0 0.8  PROT 8.2* 7.3 7.4 6.4*  ALBUMIN 4.8 4.4 4.4 3.7   Recent Labs  Lab 05/17/20 0128 05/18/20 1012  05/19/20 1406  LIPASE 24 88* 21   No results for input(s): AMMONIA in the last 168 hours. CBC: Recent Labs  Lab 05/18/20 1012 05/19/20 1406 05/20/20 0628 05/21/20 0722 05/22/20 0610  WBC 16.2* 19.9* 14.4* 8.7 10.4  NEUTROABS  --  14.2*  --   --   --   HGB 14.8 15.9* 15.1* 13.6 13.2  HCT 42.6 46.2* 44.6 41.7 40.9  MCV 91.8 90.8 93.1 96.1 96.9  PLT 277 347 253 221 236   Cardiac Enzymes: No results for input(s): CKTOTAL, CKMB, CKMBINDEX, TROPONINI in the last 168 hours. BNP: Invalid input(s): POCBNP CBG: No results for input(s): GLUCAP in the last 168 hours.  Time coordinating discharge:  36 minutes  Signed:  Orson Eva, DO [facility] Pager: [phone] 05/22/2020, 2:22 PM

## 2020-05-22 NOTE — Progress Notes (Signed)
Nsg Discharge Note  Admit Date:  05/19/2020 Discharge date: 05/22/2020   [name] to be D/C'd home per MD order.  AVS completed.  Copy for chart, and copy for patient signed, and dated. Patient/caregiver able to verbalize understanding.  Discharge Medication: Allergies as of 05/22/2020      Reactions   Nicoderm [nicotine] Itching   Sharp pain at site      Medication List    STOP taking these medications   fluocinonide-emollient 0.05 % cream Commonly known as: LIDEX-E   omeprazole 20 MG capsule Commonly known as: PRILOSEC     TAKE these medications   busPIRone 7.5 MG tablet Commonly known as: BUSPAR Take 2 tablets by mouth twice daily   dicyclomine 10 MG capsule Commonly known as: Bentyl Take 1 capsule (10 mg total) by mouth 4 (four) times daily -  before meals and at bedtime.   hydrOXYzine 25 MG tablet Commonly known as: ATARAX/VISTARIL Take 1 tablet (25 mg total) by mouth every 6 (six) hours.   Nexplanon 68 MG Impl implant Generic drug: etonogestrel 1 each by Subdermal route once.   ondansetron 4 MG disintegrating tablet Commonly known as: Zofran ODT Take 1 tablet (4 mg total) by mouth every 8 (eight) hours as needed for nausea or vomiting.   ondansetron 4 MG tablet Commonly known as: ZOFRAN Take 1 tablet (4 mg total) by mouth every 6 (six) hours.   oxyCODONE 5 MG immediate release tablet Commonly known as: Oxy IR/ROXICODONE Take 1 tablet (5 mg total) by mouth every 6 (six) hours as needed for moderate pain.   pantoprazole 40 MG tablet Commonly known as: PROTONIX Take 1 tablet (40 mg total) by mouth 2 (two) times daily before a meal.   PARoxetine 40 MG tablet Commonly known as: PAXIL Take 1 tablet (40 mg total) by mouth every morning.   potassium chloride SA 20 MEQ tablet Commonly known as: KLOR-CON Take 1 tablet (20 mEq total) by mouth 2 (two) times daily.   promethazine 25 MG tablet Commonly known as: PHENERGAN TAKE 1 TABLET BY MOUTH EVERY 6  HOURS AS NEEDED FOR NAUSEA AND VOMITING What changed: See the new instructions.   rizatriptan 10 MG tablet Commonly known as: MAXALT TAKE 1 TABLET BY MOUTH ONCE DAILY AS NEEDED FOR  MIGRAINE,  MAY  REPEAT  IN  2  HOURS  IF  NEEDED.   traZODone 50 MG tablet Commonly known as: DESYREL TAKE 1 TABLET BY MOUTH AT BEDTIME       Discharge Assessment: Vitals:   05/22/20 1140 05/22/20 1145  BP: (!) 101/49 (!) 97/48  Pulse: (!) 58 (!) 51  Resp: 15 20  Temp: 98.4 F (36.9 C)   SpO2: 97% 97%   Skin clean, dry and intact without evidence of skin break down, no evidence of skin tears noted. IV catheter discontinued intact. Site without signs and symptoms of complications - no redness or edema noted at insertion site, patient denies c/o pain - only slight tenderness at site.  Dressing with slight pressure applied.  D/c Instructions-Education: Discharge instructions given to patient/family with verbalized understanding. D/c education completed with patient/family including follow up instructions, medication list, d/c activities limitations if indicated, with other d/c instructions as indicated by MD - patient able to verbalize understanding, all questions fully answered. Patient instructed to return to ED, call 911, or call MD for any changes in condition.  Patient escorted via [place], and D/C home via private auto.  Venita Sheffield, RN  05/22/2020 1:49 PM

## 2020-05-22 NOTE — Anesthesia Postprocedure Evaluation (Signed)
Anesthesia Post Note  Patient: Debbie Santana  Procedure(s) Performed: ESOPHAGOGASTRODUODENOSCOPY (EGD) WITH PROPOFOL (N/A ) BIOPSY  Patient location during evaluation: PACU Anesthesia Type: General Level of consciousness: awake and alert Pain management: pain level controlled Vital Signs Assessment: post-procedure vital signs reviewed and stable Respiratory status: spontaneous breathing Postop Assessment: no apparent nausea or vomiting Anesthetic complications: no   No complications documented.   Last Vitals:  Vitals:   05/22/20 1038 05/22/20 1041  BP:  109/76  Pulse:  (!) 59  Resp:  15  Temp: 37.1 C   SpO2:  96%    Last Pain:  Vitals:   05/22/20 1123  TempSrc:   PainSc: 0-No pain                 Allon Costlow

## 2020-05-22 NOTE — Plan of Care (Signed)

## 2020-05-22 NOTE — Progress Notes (Addendum)
NPO except meds. EGD for today. Discussed again with patient risks and benefits. She is requesting pain medication frequently. Wants to eat. Discussed she is NPO except sips with meds. EGD as planned today.   Attending note: Seen in short stay.  Abdominal pain improved.  No dysphagia.  She wants to eat.  Plan for diagnostic EGD today per plan.  The risks, benefits, limitations, alternatives and imponderables have been reviewed with the patient. Potential for esophageal dilation, biopsy, etc. have also been reviewed.  Questions have been answered. All parties agreeable.

## 2020-05-23 ENCOUNTER — Encounter (HOSPITAL_COMMUNITY): Payer: Self-pay | Admitting: Internal Medicine

## 2020-05-23 LAB — URINE CULTURE: Culture: NO GROWTH

## 2020-05-24 ENCOUNTER — Telehealth: Payer: Self-pay | Admitting: Adult Health

## 2020-05-24 ENCOUNTER — Ambulatory Visit: Payer: Self-pay

## 2020-05-24 ENCOUNTER — Telehealth: Payer: Self-pay

## 2020-05-24 ENCOUNTER — Other Ambulatory Visit: Payer: Self-pay

## 2020-05-24 LAB — SURGICAL PATHOLOGY

## 2020-05-24 NOTE — Telephone Encounter (Signed)
Left message letting pt know Jenn can't give her pain med but she can call her GI doc. [place]

## 2020-05-24 NOTE — Patient Outreach (Signed)
Care Coordination  05/24/2020  TOBIE PERDUE [birth year] [id number]  LCSW called patient on home number; no answer. The phone rang but no option was given to allow a voice message to be left for patient. LCSW will make another attempt to engage patient later this week.   Netta Neat, MSW, LCSW Medicaid Managed Care [facility] Social Work Case Counselling psychologist D[place]: [phone]

## 2020-05-24 NOTE — Telephone Encounter (Signed)
Patient called in to see if she can get a prescription for pain meds, she said when she went to the ER they only gave her a 3 day supply.

## 2020-05-25 ENCOUNTER — Encounter: Payer: Self-pay | Admitting: Family Medicine

## 2020-05-25 ENCOUNTER — Other Ambulatory Visit: Payer: Self-pay

## 2020-05-25 ENCOUNTER — Ambulatory Visit (INDEPENDENT_AMBULATORY_CARE_PROVIDER_SITE_OTHER): Payer: Medicaid Other | Admitting: Family Medicine

## 2020-05-25 VITALS — BP 116/80 | HR 68 | Resp 16 | Ht 68.0 in | Wt 240.0 lb

## 2020-05-25 DIAGNOSIS — G8929 Other chronic pain: Secondary | ICD-10-CM

## 2020-05-25 DIAGNOSIS — M545 Low back pain: Secondary | ICD-10-CM

## 2020-05-25 DIAGNOSIS — M5441 Lumbago with sciatica, right side: Secondary | ICD-10-CM

## 2020-05-25 DIAGNOSIS — F419 Anxiety disorder, unspecified: Secondary | ICD-10-CM

## 2020-05-25 DIAGNOSIS — F418 Other specified anxiety disorders: Secondary | ICD-10-CM

## 2020-05-25 DIAGNOSIS — K279 Peptic ulcer, site unspecified, unspecified as acute or chronic, without hemorrhage or perforation: Secondary | ICD-10-CM

## 2020-05-25 DIAGNOSIS — K2900 Acute gastritis without bleeding: Secondary | ICD-10-CM

## 2020-05-25 DIAGNOSIS — M5442 Lumbago with sciatica, left side: Secondary | ICD-10-CM | POA: Diagnosis not present

## 2020-05-25 DIAGNOSIS — E669 Obesity, unspecified: Secondary | ICD-10-CM

## 2020-05-25 DIAGNOSIS — R062 Wheezing: Secondary | ICD-10-CM

## 2020-05-25 DIAGNOSIS — K2960 Other gastritis without bleeding: Secondary | ICD-10-CM

## 2020-05-25 DIAGNOSIS — Z720 Tobacco use: Secondary | ICD-10-CM

## 2020-05-25 DIAGNOSIS — F17210 Nicotine dependence, cigarettes, uncomplicated: Secondary | ICD-10-CM

## 2020-05-25 MED ORDER — BUSPIRONE HCL 7.5 MG PO TABS: 7.5000 mg | ORAL_TABLET | Freq: Three times a day (TID) | ORAL | 1 refills | Status: DC

## 2020-05-25 MED ORDER — METHYLPREDNISOLONE ACETATE 80 MG/ML IJ SUSP
80.0000 mg | Freq: Once | INTRAMUSCULAR | Status: AC
  Administered 2020-05-25: 80 mg via INTRAMUSCULAR

## 2020-05-25 MED ORDER — KETOROLAC TROMETHAMINE 60 MG/2ML IM SOLN
60.0000 mg | Freq: Once | INTRAMUSCULAR | Status: AC
  Administered 2020-05-25: 60 mg via INTRAMUSCULAR

## 2020-05-25 MED ORDER — PREDNISONE 10 MG (21) PO TBPK: ORAL_TABLET | ORAL | 0 refills | Status: DC

## 2020-05-25 MED ORDER — SUCRALFATE 1 GM/10ML PO SUSP: 1.0000 g | Freq: Three times a day (TID) | ORAL | 1 refills | Status: DC

## 2020-05-25 MED ORDER — ALBUTEROL SULFATE HFA 108 (90 BASE) MCG/ACT IN AERS: 1.0000 | INHALATION_SPRAY | Freq: Four times a day (QID) | RESPIRATORY_TRACT | 1 refills | Status: DC | PRN

## 2020-05-25 NOTE — Assessment & Plan Note (Signed)
She is open to going to pain management clinic. I think this would benefit her the most given her history.  Possible use of Cymbalta might be better an adjustment of her anxiety plus her pain medications.  Did give injections today in the office and a prednisone Dosepak to help get pain under control

## 2020-05-25 NOTE — Assessment & Plan Note (Signed)
Reported therapy as she reports she still has discomfort.  At least she will have this until she gets seen by GI.  Open for medication adjustment pending their treatment plans, appreciate collaboration in her care.

## 2020-05-25 NOTE — Assessment & Plan Note (Signed)
Obesity is linked to depression.  Debbie Santana is educated about the importance of exercise daily to help with weight management. A minumum of 30 minutes daily is recommended. Additionally, importance of healthy food choices  with portion control discussed.   Wt Readings from Last 3 Encounters:  05/25/20 240 lb (108.9 kg)  05/19/20 250 lb (113.4 kg)  05/18/20 233 lb (105.7 kg)
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
## 2020-05-25 NOTE — Assessment & Plan Note (Signed)
Asked about quitting: confirms they are currently smokes cigarettes Advise to quit smoking: Educated about QUITTING to reduce the risk of cancer, cardio and cerebrovascular disease. Assess willingness: Unwilling to quit at this time, but is working on cutting back. Assist with counseling and pharmacotherapy: Counseled for 5 minutes and literature provided. Arrange for follow up:  not quitting follow up in 3 months and continue to offer help.   

## 2020-05-25 NOTE — Assessment & Plan Note (Signed)
Has appointment with GI coming up.  I did start her on Carafate to see if that would help some of her symptoms.  Strongly encouraged her to refrain from caffeine, other acidic foods that cause her to have problems.  Increase her BuSpar in case anxiety is also component of increased acid production.

## 2020-05-25 NOTE — Patient Instructions (Signed)
I appreciate the opportunity to provide you with care for your health and wellness. Today we discussed: established care   Follow up: 8-12 weeks   No labs  Referrals today- pain clinic  Injections for pain today in office, prednisone dose pack for low back pain.  New medications-please review this packet for them.  Hope you feel better.  Please continue to practice social distancing to keep you, your family, and our community safe.  If you must go out, please wear a mask and practice good handwashing.  It was a pleasure to see you and I look forward to continuing to work together on your health and well-being. Please do not hesitate to call the office if you need care or have questions about your care.  Have a wonderful day and week. With Gratitude, Cherly Beach, DNP, AGNP-BC

## 2020-05-25 NOTE — Assessment & Plan Note (Signed)
She is currently on several medications.  I do not want to do much of an adjustment I have increased her BuSpar to 3 times daily.  Denies having any SI or HI.  In near future probably benefit from being with psychiatry.Marland Kitchen

## 2020-05-25 NOTE — Assessment & Plan Note (Signed)
I prescribed an albuterol inhaler for her.  Encouraged her to stop smoking.  Educated on how to use the medication appropriately.  If wheezing continues will consider a LABA and possible referral to pulmonary to make sure nothing is missed.

## 2020-05-26 ENCOUNTER — Telehealth: Payer: Self-pay

## 2020-05-26 ENCOUNTER — Other Ambulatory Visit: Payer: Self-pay

## 2020-05-26 NOTE — Patient Instructions (Signed)
Visit Information  Ms. Stinson  - as a part of your Medicaid benefit, you are eligible for care management and care coordination services. I was unable to reach you by phone today but would be happy to help you with your health related needs. Please feel free to call me at [phone]  The Managed Medicaid care management team will reach out to the patient again over the next 7 days.   Netta Neat, MSW, LCSW Social Work Case Freight forwarder - [place]  Direct [place]: [phone]

## 2020-05-26 NOTE — Patient Outreach (Signed)
Care Coordination  05/26/2020  AZARYA OCONNELL [birth year] [id number]  LCSW made 2nd attempt to engage patient in telephone outreach. No answer. The phone rang until it hung up; no option to leave voice message.  LCSW will consult with MM team and will make another attempt.  Netta Neat, MSW, LCSW Social Work Case Freight forwarder - [place]  Direct [place]: [phone]

## 2020-05-28 ENCOUNTER — Encounter: Payer: Self-pay | Admitting: Internal Medicine

## 2020-05-30 ENCOUNTER — Telehealth: Payer: Self-pay

## 2020-05-30 ENCOUNTER — Encounter (INDEPENDENT_AMBULATORY_CARE_PROVIDER_SITE_OTHER): Payer: Self-pay

## 2020-05-30 NOTE — Telephone Encounter (Signed)
States she has not heard anything from the pain clinic referral and wanted some more information about it.

## 2020-05-31 ENCOUNTER — Other Ambulatory Visit: Payer: Self-pay | Admitting: Adult Health

## 2020-05-31 ENCOUNTER — Encounter: Payer: Self-pay | Admitting: Family Medicine

## 2020-05-31 ENCOUNTER — Telehealth: Payer: Self-pay | Admitting: Internal Medicine

## 2020-05-31 DIAGNOSIS — F419 Anxiety disorder, unspecified: Secondary | ICD-10-CM

## 2020-05-31 NOTE — Telephone Encounter (Signed)
Pt called saying that she was out of her pain medication and her PCP wouldn't fill it for her. Looks like she is a patient of Dr Olevia Perches office, but Dr Gala Romney did an EGD on her while in the hospital. I told her we don't prescribe pain medications. Please advise. [phone]

## 2020-05-31 NOTE — Telephone Encounter (Signed)
Agree with recommendations.  

## 2020-05-31 NOTE — Progress Notes (Signed)
Refer to Lorin Picket at [facility]

## 2020-05-31 NOTE — Telephone Encounter (Signed)
FYI Spoke with pt. Pain med was given at the ED. Pt called our office to see if he would refill medication since Dr. Gala Romney did pts EGD. Pt was advised to call the doctors that she is established with to discuss pain medication.

## 2020-06-01 ENCOUNTER — Encounter: Payer: Self-pay | Admitting: Family Medicine

## 2020-06-01 ENCOUNTER — Other Ambulatory Visit: Payer: Self-pay

## 2020-06-01 NOTE — Patient Outreach (Addendum)
[name] was referred to the [facility] Managed Care High Risk team for assistance with care coordination and care management services. Care coordination/care management services as part of the Medicaid benefit was offered to the patient today. The patient declined assistance offered today.   Plan: The Medicaid Managed Care High Risk team is available at any time in the future to assist with care coordination/care management services upon referral.    Netta Neat, MSW, [place]: [phone]

## 2020-06-01 NOTE — Patient Instructions (Signed)
Thank you for taking time to speak with me today about care coordination and care management services available to you at no cost as part of your Medicaid benefit. These services are voluntary. Our team is available to provide assistance regarding your health care needs at any time. Please do not hesitate to reach out to me if we can be of service to you at any time in the future.    Netta Neat, MSW, LCSW Social Work Case Freight forwarder - [place]  Direct [place]: [phone]

## 2020-06-01 NOTE — Telephone Encounter (Signed)
Called pt and provided the number to [facility] pain clinic

## 2020-06-03 ENCOUNTER — Other Ambulatory Visit: Payer: Self-pay | Admitting: Adult Health

## 2020-06-05 NOTE — BH Specialist Note (Signed)
Integrated Behavioral Health via Telemedicine Video (Caregility) Visit  06/05/2020 [name] [id number]  Number of [place] visits: 1 Session Start time: 8:47 Session End time: 9:33 Total time: 46 minutes  Referring Provider: Derrek Monaco, NP Type of Service: Individual, Family Patient/Family location: Home [facility] [address] Provider location: Center for [employer] at [facility]  All persons participating in visit: Patient Debbie Santana and [place]    I connected with [name] and/or [place] by a video enabled telemedicine application ([place]) and verified that I am speaking with the correct person using two identifiers.   Discussed confidentiality: Yes   Confirmed demographics & insurance:  Yes   I discussed that engaging in this virtual visit, they consent to the provision of behavioral healthcare and the services will be billed under their insurance.   Patient and/or legal guardian expressed understanding and consented to virtual visit: Yes   PRESENTING CONCERNS: Patient and/or family reports the following symptoms/concerns: Pt states her primary concern today is panic attacks (3 in past week), along with irritability and restlessness with "constant cleaning", difficulty falling asleep. Pt cannot feel a difference in symptoms on buspar or vistaril, paxil increase "helps a little", and begins to feel mild sleepiness 1.5 hours after taking Trazadone. Pt has experienced fear of going into specific public places due to panic, and has developed stomach ulcers; hand and body shaking for years, even prior to beginning medication.  Pt agrees to psychiatry referral.  Duration of problem: Ongoing; Severity of problem: moderately severe  STRENGTHS (Protective Factors/Coping Skills): Self-awareness and open to treatment  ASSESSMENT: Patient currently experiencing Panic disorder with agoraphobia.    GOALS  ADDRESSED: Patient will: 1.  Reduce symptoms of: anxiety, depression, insomnia and stress  2.  Increase knowledge and/or ability of: self-management skills and stress reduction  3.  Demonstrate ability to: Increase healthy adjustment to current life circumstances  Progress of Goals: Ongoing  INTERVENTIONS: Interventions utilized:  Optician, dispensing and Psychoeducation and/or Health Education Standardized Assessments completed & reviewed: GAD-7 and PHQ 9   OUTCOME: Patient Response: Pt agrees to psychiatry referral for [facility] medication management   PLAN: 1. Follow up with behavioral health clinician on : Two weeks 2. Behavioral recommendations:  -Continue taking BH medication as prescribed -Accept referral to psychiatry -Read educational material regarding coping with panic attacks (on AVS) -Consider apps as additional self-care -Continue using self-coping strategies to manage symptoms; consider using additional self-coping strategies (daily breathing exercise and worry time) 3. Referral(s): [place] (In Clinic) and Psychiatrist  I discussed the assessment and treatment plan with the patient and/or parent/guardian. They were provided an opportunity to ask questions and all were answered. They agreed with the plan and demonstrated an understanding of the instructions.   They were advised to call back or seek an in-person evaluation as appropriate.  I discussed that the purpose of this visit is to provide behavioral health care while limiting exposure to the novel coronavirus.  Discussed there is a possibility of technology failure and discussed alternative modes of communication if that failure occurs.  Caroleen Hamman [facility]  Depression screen [facility] 2/9 06/06/2020 05/25/2020 03/14/2020 02/22/2020 10/18/2019  Decreased Interest 1 0 1 1 1   Down, Depressed, Hopeless 3 1 1 1 1   PHQ - 2 Score 4 1 2 2 2   Altered sleeping 3 3 0 3 2  Tired, decreased energy  3 1 1 1 3   Change in  appetite 1 1 1 3  0  Feeling bad or failure about yourself  1 1 0 1 0  Trouble concentrating 3 3 1 3 3   Moving slowly or fidgety/restless 3 1 0 3 0  Suicidal thoughts 0 0 0 0 0  PHQ-9 Score 18 11 5 16 10   Difficult doing work/chores - - - - -  Some recent data might be hidden   GAD 7 : Generalized Anxiety Score 06/06/2020 05/25/2020  Nervous, Anxious, on Edge 1 3  Control/stop worrying 1 3  Worry too much - different things 1 3  Trouble relaxing 1 3  Restless 3 1  Easily annoyed or irritable 1 3  Afraid - awful might happen 1 3  Total GAD 7 Score 9 19

## 2020-06-06 ENCOUNTER — Ambulatory Visit (INDEPENDENT_AMBULATORY_CARE_PROVIDER_SITE_OTHER): Payer: Medicaid Other | Admitting: Clinical

## 2020-06-06 DIAGNOSIS — F4001 Agoraphobia with panic disorder: Secondary | ICD-10-CM | POA: Diagnosis not present

## 2020-06-06 NOTE — Patient Instructions (Addendum)
Center for [facility] [place], [place] [address] [phone] (main office) [phone] [facility] office)   [place]:   [facility] [address], [place], [place] [address] [place] 8a-5pm [phone] [email or web address]   Coping with Panic Attacks   What is a panic attack?  You may have had a panic attack if you experienced four or more of the symptoms listed below coming on abruptly and peaking in about 10 minutes.  Panic Symptoms   . Pounding heart  . Sweating  . Trembling or shaking  . Shortness of breath  . Feeling of choking  . Chest pain  . Nausea or abdominal distress    . Feeling dizzy, unsteady, lightheaded, or faint  . Feelings of unreality or being detached from yourself  . Fear of losing control or going crazy  . Fear of dying  . Numbness or tingling  . Chills or hot flashes      Panic attacks are sometimes accompanied by avoidance of certain places or situations. These are often situations that would be difficult to escape from or in which help might not be available. Examples might include crowded shopping malls, public transportation, restaurants, or driving.   Why do panic attacks occur?   Panic attacks are the body's alarm system gone awry. All of [place] have a built-in alarm system, powered by adrenaline, which increases our heart rate, breathing, and blood flow in response to danger. Ordinarily, this 'danger response system' works well. In some people, however, the response is either out of proportion to whatever stress is going on, or may come out of the blue without any stress at all.   For example, if you are walking in the woods and see a bear coming your way, a variety of changes occur in your body to prepare you to either fight the danger or flee from the situation. Your heart rate will increase to get more blood flow around your body, your breathing rate will  quicken so that more oxygen is available, and your muscles will tighten in order to be ready to fight or run. You may feel nauseated as blood flow leaves your stomach area and moves into your limbs. These bodily changes are all essential to helping you survive the dangerous situation. After the danger has passed, your body functions will begin to go back to normal. This is because your body also has a system for "recovering" by bringing your body back down to a normal state when the danger is over.   As you can see, the emergency response system is adaptive when there is, in fact, a "true" or "real" danger (e.g., bear). However, sometimes people find that their emergency response system is triggered in "everyday" situations where there really is no true physical danger (e.g., in a meeting, in the grocery store, while driving in normal traffic, etc.).   What triggers a panic attack?  Sometimes particularly stressful situations can trigger a panic attack. For example, an argument with your spouse or stressors at work can cause a stress response (activating the emergency response system) because you perceive it as threatening or overwhelming, even if there is no direct risk to your survival.  Sometimes panic attacks don't seem to be triggered by anything in particular- they may "come out of the blue". Somehow, the natural "fight or flight" emergency response system has gotten activated when there is no real danger. Why does the body go into "emergency mode" when  there is no real danger?   Often, people with panic attacks are frightened or alarmed by the physical sensations of the emergency response system. First, unexpected physical sensations are experienced (tightness in your chest or some shortness of breath). This then leads to feeling fearful or alarmed by these symptoms ("Something's wrong!", "Am I having a heart attack?", "Am I going to faint?") The mind perceives that there is a danger even though no  real danger exists. This, in turn, activates the emergency response system ("fight or flight"), leading to a "full blown" panic attack. In summary, panic attacks occur when we misinterpret physical symptoms as signs of impending death, craziness, loss of control, embarrassment, or fear of fear. Sometimes you may be aware of thoughts of danger that activate the emergency response system (for example, thinking "I'm having a heart attack" when you feel chest pressure or increased heart rate). At other times, however, you may not be aware of such thoughts. After several incidences of being afraid of physical sensations, anxiety and panic can occur in response to the initial sensations without conscious thoughts of danger. Instead, you just feel afraid or alarmed. In other words, the panic or fear may seem to occur "automatically" without you consciously telling yourself anything.   After having had one or more panic attacks, you may also become more focused on what is going on inside your body. You may scan your body and be more vigilant about noticing any symptoms that might signal the start of a panic attack. This makes it easier for panic attacks to happen again because you pick up on sensations you might otherwise not have noticed, and misinterpret them as something dangerous. A panic attack may then result.      How do I cope with panic attacks?  An important part of overcoming panic attacks involves re-interpreting your body's physical reactions and teaching yourself ways to decrease the physical arousal. This can be done through practicing the cognitive and behavioral interventions below.   Research has found that over half of people who have panic attacks show some signs of hyperventilation or overbreathing. This can produce initial sensations that alarm you and lead to a panic attack. Overbreathing can also develop as part of the panic attack and make the symptoms worse. When people hyperventilate,  certain blood vessels in the body become narrower. In particular, the brain may get slightly less oxygen. This can lead to the symptoms of dizziness, confusion, and lightheadedness that often occur during panic attacks. Other parts of the body may also get a bit less oxygen, which may lead to numbness or tingling in the hands or feet or the sensation of cold, clammy hands. It also may lead the heart to pump harder. Although these symptoms may be frightening and feel unpleasant, it is important to remember that hyperventilating is not dangerous. However, you can help overcome the unpleasantness of overbreathing by practicing Breathing Retraining.   Practice this basic technique three times a day, every day:  . Inhale. With your shoulders relaxed, inhale as slowly and deeply as you can while you count to six. If you can, use your diaphragm to fill your lungs with air.  . Hold. Keep the air in your lungs as you slowly count to four.  . Exhale. Slowly breath out as you count to six.  . Repeat. Do the inhale-hold-exhale cycle several times. Each time you do it, exhale for longer counts.  Like any new skill, Breathing Retraining requires practice. Try  practicing this skill twice a day for several minutes. Initially, do not try this technique in specific situations or when you become frightened or have a panic attack. Begin by practicing in a quiet environment to build up your skill level so that you can later use it in time of "emergency."   2. Decreasing Avoidance  Regardless of whether you can identify why you began having panic attacks or whether they seemed to come out of the blue, the places where you began having panic attacks often can become triggers themselves. It is not uncommon for individuals to begin to avoid the places where they have had panic attacks. Over time, the individual may begin to avoid more and more places, thereby decreasing their activities and often negatively impacting their  quality of life. To break the cycle of avoidance, it is important to first identify the places or situations that are being avoided, and then to do some "relearning."  To begin this intervention, first create a list of locations or situations that you tend to avoid. Then choose an avoided location or situation that you would like to target first. Now develop an "exposure hierarchy" for this situation or location. An "exposure hierarchy" is a list of actions that make you feel anxious in this situation. Order these actions from least to most anxiety-producing. It is often helpful to have the first item on your hierarchy involve thinking or imagining part of the feared/avoided situation.   Here is an example of an exposure hierarchy for decreasing avoidance of the grocery store. Note how it is ordered from the least amount of anxiety (at the top) to the most anxiety (at the bottom):  Marland Kitchen Think about going to the grocery store alone.  . Go to the grocery store with a friend or family member.  . Go to the grocery store alone to pick up a few small items (5-10 minutes in the store).  . Shopping for 10-20 minutes in the store alone.  . Doing the shopping for the week by myself (20-30 minutes in the store).   Your homework is to "expose" yourself to the lowest item on your hierarchy and use your breathing relaxation and coping statements (see below) to help you remain in the situation. Practice this several times during the upcoming week. Once you have mastered each item with minimal anxiety, move on to the next higher action on your list.   Cognitive Interventions  1. Identify your negative self-talk Anxious thoughts can increase anxiety symptoms and panic. The first step in changing anxious thinking is to identify your own negative, alarming self-talk. Some common alarming thoughts:  . I'm having a heart attack.            . I must be going crazy. . I think I'm dying. Marland Kitchen People will think I'm  crazy. . I'm going to pass our.  . Oh no- here it comes.  . I can't stand this.  Rene Paci got to get out of here!  2. Use positive coping statements Changing or disrupting a pattern of anxious thoughts by replacing them with more calming or supportive statements can help to divert a panic attack. Some common helpful coping statements:  . This is not an emergency.  . I don't like feeling this way, but I can accept it.  . I can feel like this and still be okay.  . This has happened before, and I was okay. I'll be okay this time, too.  Marland Kitchen I  can be anxious and still deal with this situation.       /Emotional [employer] and Websites Here are a few free apps meant to help you to help yourself.  To find, try searching on the internet to see if the app is offered on Apple/Android devices. If your first choice doesn't come up on your device, the good news is that there are many choices! Play around with different apps to see which ones are helpful to you.    Calm This is an app meant to help increase calm feelings. Includes info, strategies, and tools for tracking your feelings.      Calm Harm  This app is meant to help with self-harm. Provides many 5-minute or 15-min coping strategies for doing instead of hurting yourself.       [place] is a problem-solving tool to help deal with emotions and cope with stress you encounter wherever you are.      MindShift This app can help people cope with anxiety. Rather than trying to avoid anxiety, you can make an important shift and face it.      MY3  MY3 features a support system, safety plan and resources with the goal of offering a tool to use in a time of need.       My Life My Voice  This mood journal offers a simple solution for tracking your thoughts, feelings and moods. Animated emoticons can help identify your mood.       Relax Melodies Designed to help with sleep, on this app you can mix sounds and  meditations for relaxation.      Smiling Mind Smiling Mind is meditation made easy: it's a simple tool that helps put a smile on your mind.        Stop, Breathe & Think  A friendly, simple guide for people through meditations for mindfulness and compassion.  Stop, Breathe and Think Kids Enter your current feelings and choose a "mission" to help you cope. Offers videos for certain moods instead of just sound recordings.       Team Orange The goal of this tool is to help teens change how they think, act, and react. This app helps you focus on your own good feelings and experiences.      The [place] Box The [place] Box (VHB) contains simple tools to help patients with coping, relaxation, distraction, and positive thinking.

## 2020-06-08 ENCOUNTER — Other Ambulatory Visit: Payer: Self-pay | Admitting: Family Medicine

## 2020-06-08 ENCOUNTER — Other Ambulatory Visit: Payer: Self-pay | Admitting: Adult Health

## 2020-06-08 DIAGNOSIS — G43809 Other migraine, not intractable, without status migrainosus: Secondary | ICD-10-CM

## 2020-06-08 MED ORDER — SUMATRIPTAN SUCCINATE 25 MG PO TABS: 25.0000 mg | ORAL_TABLET | ORAL | 0 refills | Status: DC | PRN

## 2020-06-08 MED ORDER — BUTALBITAL-APAP-CAFFEINE 50-325-40 MG PO TABS: 1.0000 | ORAL_TABLET | Freq: Four times a day (QID) | ORAL | 0 refills | Status: DC | PRN

## 2020-06-08 NOTE — Telephone Encounter (Signed)
She needs to ask the provider that sent in the zofran to refill it. It has not been quite a month from previously one.

## 2020-06-08 NOTE — Telephone Encounter (Signed)
I do not prescribed controlled substances. I will try another medication for migraines. If that does not work, I think Neurology would be a good idea.

## 2020-06-08 NOTE — Progress Notes (Signed)
Will rx Fioricet for migraine

## 2020-06-09 NOTE — Telephone Encounter (Signed)
Pt advised of recommendation with verbal understanding  

## 2020-06-12 ENCOUNTER — Other Ambulatory Visit: Payer: Self-pay

## 2020-06-12 MED ORDER — TRAZODONE HCL 50 MG PO TABS: 50.0000 mg | ORAL_TABLET | Freq: Every day | ORAL | 0 refills | Status: DC

## 2020-06-13 ENCOUNTER — Other Ambulatory Visit: Payer: Self-pay | Admitting: Adult Health

## 2020-06-15 ENCOUNTER — Ambulatory Visit (INDEPENDENT_AMBULATORY_CARE_PROVIDER_SITE_OTHER): Payer: Medicaid Other | Admitting: Gastroenterology

## 2020-06-15 ENCOUNTER — Other Ambulatory Visit: Payer: Self-pay

## 2020-06-15 ENCOUNTER — Ambulatory Visit
Admission: EM | Admit: 2020-06-15 | Discharge: 2020-06-15 | Disposition: A | Discharge status: Home / Self Care | Payer: Medicaid Other | Attending: Emergency Medicine | Admitting: Emergency Medicine

## 2020-06-15 ENCOUNTER — Encounter (INDEPENDENT_AMBULATORY_CARE_PROVIDER_SITE_OTHER): Payer: Self-pay

## 2020-06-15 ENCOUNTER — Encounter (INDEPENDENT_AMBULATORY_CARE_PROVIDER_SITE_OTHER): Payer: Self-pay | Admitting: Gastroenterology

## 2020-06-15 VITALS — BP 129/73 | HR 73 | Temp 96.9°F | Ht 68.0 in | Wt 241.6 lb

## 2020-06-15 DIAGNOSIS — Z1152 Encounter for screening for COVID-19: Secondary | ICD-10-CM | POA: Diagnosis not present

## 2020-06-15 DIAGNOSIS — G8929 Other chronic pain: Secondary | ICD-10-CM

## 2020-06-15 DIAGNOSIS — K2100 Gastro-esophageal reflux disease with esophagitis, without bleeding: Secondary | ICD-10-CM | POA: Diagnosis not present

## 2020-06-15 DIAGNOSIS — J069 Acute upper respiratory infection, unspecified: Secondary | ICD-10-CM | POA: Diagnosis not present

## 2020-06-15 DIAGNOSIS — R1013 Epigastric pain: Secondary | ICD-10-CM

## 2020-06-15 DIAGNOSIS — Z20822 Contact with and (suspected) exposure to covid-19: Secondary | ICD-10-CM

## 2020-06-15 DIAGNOSIS — R062 Wheezing: Secondary | ICD-10-CM | POA: Diagnosis not present

## 2020-06-15 LAB — CBC WITH DIFFERENTIAL/PLATELET
Absolute Monocytes: 464 cells/uL (ref 200–950)
Basophils Absolute: 18 cells/uL (ref 0–200)
Basophils Relative: 0.2 %
Eosinophils Absolute: 127 cells/uL (ref 15–500)
Eosinophils Relative: 1.4 %
HCT: 40.3 % (ref 35.0–45.0)
Hemoglobin: 13.8 g/dL (ref 11.7–15.5)
Lymphs Abs: 2311 cells/uL (ref 850–3900)
MCH: 31.7 pg (ref 27.0–33.0)
MCHC: 34.2 g/dL (ref 32.0–36.0)
MCV: 92.6 fL (ref 80.0–100.0)
MPV: 11.4 fL (ref 7.5–12.5)
Monocytes Relative: 5.1 %
Neutro Abs: 6179 cells/uL (ref 1500–7800)
Neutrophils Relative %: 67.9 %
Platelets: 234 10*3/uL (ref 140–400)
RBC: 4.35 10*6/uL (ref 3.80–5.10)
RDW: 12.3 % (ref 11.0–15.0)
Total Lymphocyte: 25.4 %
WBC: 9.1 10*3/uL (ref 3.8–10.8)

## 2020-06-15 LAB — COMPLETE METABOLIC PANEL WITH GFR
AG Ratio: 1.7 (calc) (ref 1.0–2.5)
ALT: 16 U/L (ref 6–29)
AST: 12 U/L (ref 10–30)
Albumin: 3.9 g/dL (ref 3.6–5.1)
Alkaline phosphatase (APISO): 65 U/L (ref 31–125)
BUN: 9 mg/dL (ref 7–25)
CO2: 26 mmol/L (ref 20–32)
Calcium: 9.6 mg/dL (ref 8.6–10.2)
Chloride: 106 mmol/L (ref 98–110)
Creat: 0.86 mg/dL (ref 0.50–1.10)
GFR, Est African American: 105 mL/min/{1.73_m2} (ref >=60)
GFR, Est Non African American: 91 mL/min/{1.73_m2} (ref >=60)
Globulin: 2.3 g/dL (calc) (ref 1.9–3.7)
Glucose, Bld: 95 mg/dL (ref 65–139)
Potassium: 4.5 mmol/L (ref 3.5–5.3)
Sodium: 138 mmol/L (ref 135–146)
Total Bilirubin: 0.2 mg/dL (ref 0.2–1.2)
Total Protein: 6.2 g/dL (ref 6.1–8.1)

## 2020-06-15 MED ORDER — BENZONATATE 100 MG PO CAPS: 100.0000 mg | ORAL_CAPSULE | Freq: Three times a day (TID) | ORAL | 0 refills | Status: DC

## 2020-06-15 MED ORDER — PREDNISONE 10 MG (21) PO TBPK: ORAL_TABLET | Freq: Every day | ORAL | 0 refills | Status: DC

## 2020-06-15 MED ORDER — ONDANSETRON 8 MG PO TBDP: 8.0000 mg | ORAL_TABLET | Freq: Three times a day (TID) | ORAL | 0 refills | Status: DC | PRN

## 2020-06-15 MED ORDER — DEXAMETHASONE SODIUM PHOSPHATE 10 MG/ML IJ SOLN
10.0000 mg | Freq: Once | INTRAMUSCULAR | Status: AC
  Administered 2020-06-15: 10 mg via INTRAMUSCULAR

## 2020-06-15 MED ORDER — SUCRALFATE 1 G PO TABS: 1.0000 g | ORAL_TABLET | Freq: Three times a day (TID) | ORAL | 3 refills | Status: DC

## 2020-06-15 NOTE — Progress Notes (Signed)
Patient profile: Debbie Santana is a 29 y.o. female seen for follow-up. She was seen in the emergency room 05/20/20 for nausea vomiting with exacerbation of upper abdominal pain with blood-tinged emesis.  She had an endoscopy as below while she was admitted.  Symptoms felt related to possible gastroenteritis exacerbated by NSAIDs.   History of Present Illness: Debbie Santana is seen today for follow-up.  She reports improvement in vomiting since her discharge from the ER but she still continues to have stomach pain daily.  This seems worse in the morning when she first gets up and she reports vomiting most mornings.  She also has nausea with the symptoms throughout the day.  Describes a burning in her epigastric as well as esophageal burning.  She endorses some early satiety as well.  She reports having chronic issues with diarrhea.  She has 3 bowel movements a day with some urgency after stooling.  This is been ongoing since her gallbladder was removed 2018.  Stools are loose but denies any blood in stool or lower abdominal pain.  No severe changes in bowel habits with flare of epigastric symptoms.  She smokes 1 pack a day.  She does not drink alcohol.  She denies any NSAIDs use since hospital discharge.  States she takes dicyclomine 3 times daily and Protonix twice daily.  Wt Readings from Last 3 Encounters:  06/15/20 241 lb 9.6 oz (109.6 kg)  05/25/20 240 lb (108.9 kg)  05/19/20 250 lb (113.4 kg)      Last Endoscopy: 04/2020--Erosive reflux esophagitis. Gastric erosions?status post biopsy; normal duodenal bulb and second portion of the duodenum. -Clinically, she is much improved. Poorly controlled reflux lately. Cannot rule out recent bout of viral gastroenteritis. NSAID use may have exacerbated A. STOMACH, BIOPSY:  - Mild chronic gastritis. Mild reactive gastropathy.  - Warthin-Starry stain is negative for Helicobacter pylori.      Past Medical History:  Past Medical History:    Diagnosis Date  . Anxiety   . Bradycardia 04/17/2017  . Chest pain on breathing 02/22/2020  . Chronic back pain   . Depression   . 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 Gastric ulcer   . Genital warts   . History of gestational hypertension 05/21/2018   Dx intrapartum  . History of kidney stones   . Intertrigo 11/16/2019  . Intractable abdominal pain 04/11/2017  . Intractable vomiting 05/20/2020  . Irregular intermenstrual bleeding 02/19/2019  . Mental disorder   . Migraine without aura and without status migrainosus, not intractable 02/19/2019  . Nausea 05/10/2020  . Nausea & vomiting 12/07/2018  . Nexplanon in place 02/19/2019  . Obesity 04/17/2017  . Peptic ulcer disease 04/13/2017  . Postpartum hypertension 06/23/2018   requiring norvasc, resolved at pp visit  . Sciatica   . Vaginal Pap smear, abnormal     Problem List: Patient Active Problem List   Diagnosis Date Noted  . Obesity (BMI 35.0-39.9 without comorbidity) 05/20/2020  . 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 Hypokalemia 05/19/2020  . Panic attack 05/18/2020  . Epigastric pain 05/18/2020  . Agitated 05/18/2020  . Gastroesophageal reflux disease 05/10/2020  . Alternating constipation and diarrhea 05/10/2020  . Wheezing on inspiration 02/22/2020  . Sleep disturbance 02/22/2020  . Anxiety and depression 02/19/2019  . Low back pain 12/07/2018  . Condyloma 04/15/2018  . History of abnormal cervical Pap smear 11/04/2017  . Cigarette smoker 04/26/2017  . Reflux esophagitis 04/17/2017  . Tobacco abuse 04/17/2017  . Peptic  ulcer disease 04/13/2017  . 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 Anxiety 04/11/2017    Past Surgical History: Past Surgical History:  Procedure Laterality Date  . BACK SURGERY     ruptured disc.  . BIOPSY  04/14/2017   Procedure: BIOPSY;  Surgeon: Rogene Houston, MD;  Location: [facility];  Service: Endoscopy;;  gastric  . BIOPSY  05/22/2020   Procedure: BIOPSY;  Surgeon: Daneil Dolin, MD;   Location: [facility];  Service: Endoscopy;;  . CERVICAL ABLATION N/A 12/02/2018   Procedure: LASER ABLATION OF CERVIX;  Surgeon: Florian Buff, MD;  Location: [facility];  Service: Gynecology;  Laterality: N/A;  . CHOLECYSTECTOMY N/A 04/17/2017   Procedure: LAPAROSCOPIC CHOLECYSTECTOMY;  Surgeon: Aviva Signs, MD;  Location: [facility];  Service: General;  Laterality: N/A;  . COLPOSCOPY    . ESOPHAGOGASTRODUODENOSCOPY (EGD) WITH PROPOFOL N/A 04/14/2017   Procedure: ESOPHAGOGASTRODUODENOSCOPY (EGD) WITH PROPOFOL;  Surgeon: Rogene Houston, MD;  [facility];  Service: Endoscopy;  Laterality: N/A;  . ESOPHAGOGASTRODUODENOSCOPY (EGD) WITH PROPOFOL N/A 05/22/2020   Procedure: ESOPHAGOGASTRODUODENOSCOPY (EGD) WITH PROPOFOL;  Surgeon: Daneil Dolin, MD;  [facility];  Service: Endoscopy;  Laterality: N/A;  . HERNIA REPAIR Bilateral    inguinal  . LASER ABLATION CONDOLAMATA N/A 12/02/2018   Procedure: LASER ABLATION CONDYLOMA ACCUMINATA LEFT AND RIGHT VULVA, PERINEUM AND PERIANAL (15 TOTAL);  Surgeon: Florian Buff, MD;  Location: [facility];  Service: Gynecology;  Laterality: N/A;    Allergies: Allergies  Allergen Reactions  . Nicoderm [Nicotine] Itching    Sharp pain at site      Home Medications:  Current Outpatient Medications:  .  albuterol (VENTOLIN HFA) 108 (90 Base) MCG/ACT inhaler, Inhale 1-2 puffs into the lungs every 6 (six) hours as needed for wheezing or shortness of breath., Disp: 8 g, Rfl: 1 .  busPIRone (BUSPAR) 7.5 MG tablet, Take 1 tablet (7.5 mg total) by mouth 3 (three) times daily., Disp: 90 tablet, Rfl: 1 .  butalbital-acetaminophen-caffeine (FIORICET) 50-325-40 MG tablet, TAKE 1 TABLET BY MOUTH EVERY 6 HOURS AS NEEDED FOR HEADACHE, Disp: 20 tablet, Rfl: 0 .  dicyclomine (BENTYL) 10 MG capsule, Take 1 capsule (10 mg total) by mouth 4 (four) times daily -  before meals and at bedtime., Disp: 90 capsule, Rfl: 3 .  etonogestrel (NEXPLANON) 68 MG IMPL implant,  1 each by Subdermal route once., Disp: , Rfl:  .  hydrOXYzine (ATARAX/VISTARIL) 25 MG tablet, Take 1 tablet (25 mg total) by mouth every 6 (six) hours., Disp: 30 tablet, Rfl: 3 .  pantoprazole (PROTONIX) 40 MG tablet, Take 1 tablet (40 mg total) by mouth 2 (two) times daily before a meal., Disp: 60 tablet, Rfl: 1 .  PARoxetine (PAXIL) 40 MG tablet, Take 1 tablet (40 mg total) by mouth every morning., Disp: 30 tablet, Rfl: 6 .  promethazine (PHENERGAN) 25 MG tablet, TAKE 1 TABLET BY MOUTH EVERY 6 HOURS AS NEEDED FOR NAUSEA AND VOMITING, Disp: 30 tablet, Rfl: 0 .  SUMAtriptan (IMITREX) 25 MG tablet, Take 1 tablet (25 mg total) by mouth every 2 (two) hours as needed for migraine. May repeat in 2 hours if headache persists or recurs., Disp: 10 tablet, Rfl: 0 .  traZODone (DESYREL) 50 MG tablet, TAKE 1 TABLET BY MOUTH AT BEDTIME, Disp: 30 tablet, Rfl: 0 .  benzonatate (TESSALON) 100 MG capsule, Take 1 capsule (100 mg total) by mouth every 8 (eight) hours., Disp: 21 capsule, Rfl: 0 .  ondansetron (ZOFRAN ODT) 8 MG disintegrating tablet, Take 1 tablet (8 mg total) by mouth every 8 (eight) hours as needed for nausea or vomiting., Disp: 30 tablet, Rfl: 0 .  predniSONE (STERAPRED UNI-PAK 21 TAB) 10 MG (21) TBPK tablet, Take by mouth daily. Take 6 tabs by mouth daily  for 2 days, then 5 tabs for 2 days, then 4 tabs for 2 days, then 3 tabs for 2 days, 2 tabs for 2 days, then 1 tab by mouth daily for 2 days, Disp: 42 tablet, Rfl: 0 .  sucralfate (CARAFATE) 1 g tablet, Take 1 tablet (1 g total) by mouth 3 (three) times daily before meals., Disp: 90 tablet, Rfl: 3   Family History: family history includes Anxiety disorder in her mother; Crohn's disease in her sister; Diabetes in her cousin and maternal grandfather; Hyperlipidemia in her mother; Learning disabilities in her cousin.    Social History:   reports that she has been smoking cigarettes. She has a 2.50 pack-year smoking history. She has never used  smokeless tobacco. She reports that she does not drink alcohol and does not use drugs.   Review of Systems: Constitutional: Denies weight loss/weight gain  Eyes: No changes in vision. ENT: No oral lesions, sore throat.  GI: see HPI.  Heme/Lymph: No easy bruising.  CV: No chest pain.  GU: No hematuria.  Integumentary: No rashes.  Neuro: No headaches.  Psych: No depression/anxiety.  Endocrine: No heat/cold intolerance.  Allergic/Immunologic: No urticaria.  Resp: No cough, SOB.  Musculoskeletal: No joint swelling.    Physical Examination: BP 129/73 (BP Location: Right Arm, Patient Position: Sitting, Cuff Size: Normal)   Pulse 73   Temp (!) 96.9 F (36.1 C) (Temporal)   Ht 5\' 8"  (1.727 m)   Wt 241 lb 9.6 oz (109.6 kg)   LMP 05/22/2020   BMI 36.74 kg/m  Gen: NAD, alert and oriented x 4 HEENT: PEERLA, EOMI, Neck: supple, no JVD Chest: Wheezing upper and lower lobes bilaterally. +cough in clinic CV: RRR, no m/g/c/r Abd: soft, minimal tenderness palpation epigastric area, ND, +BS in all four quadrants; no HSM, guarding, ridigity, or rebound tenderness Ext: no edema, well perfused with 2+ pulses, Skin: no rash or lesions noted on observed skin Lymph: no noted LAD  Data Reviewed:  05/17/20-admission labs revealed white count of 23 with hemoglobin 15.7.  LFTs normal.  Potassium was 2.9.   Discharge labs 05/22/20-white count 10.4, potassium 3.4.  Calcium 8.8.  Hemoglobin 13.2   Assessment/Plan: Ms. Louth is a 29 y.o. female seen for follow-up of hematemesis, epigastric pain   1.  Epigastric pain-EGD while inpatient as above.  She is on PPI twice daily and reports severe epigastric burning, will add on Carafate to her regimen.  We discussed decreasing tobacco.  She denies alcohol or NSAIDs.  She also feels Zofran helps more for nausea Phenergan I will send a refill this to her pharmacy.  We will repeat labs today.  2.  Asthma-she has been out of her albuterol inhaler, she has  significant wheezing on respiratory exam and reports a history of coughing over the past few days.  Reviewed importance of picking this inhaler refill at pharmacy.  She will notify PCP if symptoms continue  Follow-up 4 to 6 weeks to assess response to Carafate and hopefully wean doses if improved.  She reports using oxycodone in the past for back and abdominal pain.  She is awaiting an appointment in the pain clinic in October.   Lovena Le  was seen today for abdominal pain.  Diagnoses and all orders for this visit:  Gastroesophageal reflux disease with esophagitis without hemorrhage -     CBC with Differential -     COMPLETE METABOLIC PANEL WITH GFR  Abdominal pain, chronic, epigastric -     CBC with Differential -     COMPLETE METABOLIC PANEL WITH GFR  Wheezing on inspiration  Other orders -     ondansetron (ZOFRAN ODT) 8 MG disintegrating tablet; Take 1 tablet (8 mg total) by mouth every 8 (eight) hours as needed for nausea or vomiting. -     sucralfate (CARAFATE) 1 g tablet; Take 1 tablet (1 g total) by mouth 3 (three) times daily before meals.     I personally performed the service, non-incident to. (WP)  Laurine Blazer, [facility]

## 2020-06-15 NOTE — Discharge Instructions (Signed)
COVID testing ordered.  It will take between 5-7 days for test results.  Someone will contact you regarding abnormal results.    In the meantime: You should remain isolated in your home for 10 days from symptom onset AND greater than 72 hours after symptoms resolution (absence of fever without the use of fever-reducing medication and improvement in respiratory symptoms), whichever is longer Get plenty of rest and push fluids Tessalon Perles prescribed for cough Prednisone prescribed.  Take as directed Use OTC zyrtec for nasal congestion, runny nose, and/or sore throat Use OTC flonase for nasal congestion and runny nose Use medications daily for symptom relief Use OTC medications like ibuprofen or tylenol as needed fever or pain Call or go to the ED if you have any new or worsening symptoms such as fever, worsening cough, shortness of breath, chest tightness, chest pain, turning blue, changes in mental status, etc..Marland Kitchen

## 2020-06-15 NOTE — Patient Instructions (Signed)
We are starting Carafate which should help with your abdominal pain-this is 3 times a day before meals.  I sent a refill of Zofran to use as needed for nausea.  Please notify PCP if respiratory symptoms continue.
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
## 2020-06-15 NOTE — ED Triage Notes (Signed)
Pt presents with cough and congestion that began yesterday, has h/o bronchitis

## 2020-06-17 LAB — SARS-COV-2, NAA 2 DAY TAT

## 2020-06-17 LAB — NOVEL CORONAVIRUS, NAA: SARS-CoV-2, NAA: NOT DETECTED

## 2020-06-19 NOTE — BH Specialist Note (Signed)
Integrated Behavioral Health via Telemedicine Video (Caregility) Visit  06/19/2020 [name] [id number]  Number of [place] visits: 2 Session Start time: 10:51  Session End time: 11:05 Total time: 14 minutes  Referring Provider: Derrek Monaco, NP Type of Service: Individual Patient/Family location: Home [facility] Provider location: [facility] for [place] [facility]  All persons participating in visit: Patient Debbie Santana and [place]     I connected with [name] by a video enabled telemedicine application (Caregility) and verified that I am speaking with the correct person using two identifiers.   Discussed confidentiality: at previous visit  Confirmed demographics & insurance:  Yes   I discussed that engaging in this virtual visit, they consent to the provision of behavioral healthcare and the services will be billed under their insurance.   Patient and/or legal guardian expressed understanding and consented to virtual visit: Yes   PRESENTING CONCERNS: Patient and/or family reports the following symptoms/concerns: Pt states her primary concern today is that she spilled her entire bottle of Buspar, and has been out for a week.  Duration of problem: One week; Severity of problem: moderate  STRENGTHS (Protective Factors/Coping Skills): Self-awareness and adhering to treatment  ASSESSMENT: Patient currently experiencing Panic disorder without agoraphobia  .    GOALS ADDRESSED: Patient will: 1.  Reduce symptoms of: anxiety and stress  2.  Increase knowledge and/or ability of: stress reduction  3.  Demonstrate ability to: Increase motivation to adhere to plan of care   Progress of Goals: Ongoing  INTERVENTIONS: Interventions utilized:  Solution-Focused Strategies Standardized Assessments completed & reviewed: Not Needed   OUTCOME: Patient Response: Pt agrees to following treatment  plan   PLAN: 1. Follow up with behavioral health clinician on : As needed 2. Behavioral recommendations:  -Establish care with [facility] via walk-in hours, as discussed, for ongoing [place] medication management and ongoing therapy -Contact PCP to inquire about obtaining 4 days worth of medication, prior to obtaining next full bottle -Consider using self-coping strategies to manage symptoms of anxiety with panic  3. Referral(s): [place] (In Clinic)  I discussed the assessment and treatment plan with the patient and/or parent/guardian. They were provided an opportunity to ask questions and all were answered. They agreed with the plan and demonstrated an understanding of the instructions.   They were advised to call back or seek an in-person evaluation as appropriate.  I discussed that the purpose of this visit is to provide behavioral health care while limiting exposure to the novel coronavirus.  Discussed there is a possibility of technology failure and discussed alternative modes of communication if that failure occurs.  Caroleen Hamman Alanni Vader

## 2020-06-20 ENCOUNTER — Ambulatory Visit (INDEPENDENT_AMBULATORY_CARE_PROVIDER_SITE_OTHER): Payer: Medicaid Other | Admitting: Clinical

## 2020-06-20 ENCOUNTER — Other Ambulatory Visit: Payer: Self-pay

## 2020-06-20 ENCOUNTER — Other Ambulatory Visit: Payer: Self-pay | Admitting: Family Medicine

## 2020-06-20 ENCOUNTER — Encounter: Payer: Self-pay | Admitting: Family Medicine

## 2020-06-20 DIAGNOSIS — F4001 Agoraphobia with panic disorder: Secondary | ICD-10-CM

## 2020-06-20 DIAGNOSIS — G43809 Other migraine, not intractable, without status migrainosus: Secondary | ICD-10-CM

## 2020-06-20 NOTE — Telephone Encounter (Signed)
Pt advised nothing would be prescribed for sleep as she has been sent to pain management advised they had called her to schedule and was unable to contact gave her the number to reschedule with verbal understanding

## 2020-06-20 NOTE — Patient Instructions (Addendum)
Center for [facility] [place], [place] [address] [phone] (main office) [phone] [facility] office)  [facility] [address], [place], [place] [address] [place] 8am-5pm [phone] [email or web address] *Walk-in recommended for initial visit

## 2020-06-22 ENCOUNTER — Encounter: Payer: Self-pay | Admitting: Family Medicine

## 2020-06-22 ENCOUNTER — Other Ambulatory Visit: Payer: Self-pay

## 2020-06-22 ENCOUNTER — Other Ambulatory Visit: Payer: Self-pay | Admitting: Family Medicine

## 2020-06-22 DIAGNOSIS — G43809 Other migraine, not intractable, without status migrainosus: Secondary | ICD-10-CM

## 2020-06-22 MED ORDER — BUTALBITAL-APAP-CAFFEINE 50-325-40 MG PO TABS: 1.0000 | ORAL_TABLET | ORAL | 0 refills | Status: DC | PRN

## 2020-06-22 MED ORDER — PROMETHAZINE HCL 25 MG PO TABS: 25.0000 mg | ORAL_TABLET | ORAL | 1 refills | Status: DC | PRN

## 2020-06-23 NOTE — Telephone Encounter (Signed)
noted 

## 2020-06-23 NOTE — Telephone Encounter (Signed)
Looks like OBGYN addressed this.

## 2020-06-26 DIAGNOSIS — F4323 Adjustment disorder with mixed anxiety and depressed mood: Secondary | ICD-10-CM | POA: Diagnosis not present

## 2020-06-29 ENCOUNTER — Other Ambulatory Visit: Payer: Self-pay

## 2020-06-29 MED ORDER — BUTALBITAL-APAP-CAFFEINE 50-325-40 MG PO TABS: 1.0000 | ORAL_TABLET | ORAL | 0 refills | Status: DC | PRN

## 2020-07-04 DIAGNOSIS — F4323 Adjustment disorder with mixed anxiety and depressed mood: Secondary | ICD-10-CM | POA: Diagnosis not present

## 2020-07-05 ENCOUNTER — Other Ambulatory Visit: Payer: Self-pay | Admitting: Advanced Practice Midwife

## 2020-07-05 ENCOUNTER — Other Ambulatory Visit: Payer: Self-pay

## 2020-07-05 ENCOUNTER — Other Ambulatory Visit: Payer: Self-pay | Admitting: Adult Health

## 2020-07-05 MED ORDER — PROMETHAZINE HCL 25 MG PO TABS: 25.0000 mg | ORAL_TABLET | ORAL | 1 refills | Status: DC | PRN

## 2020-07-05 NOTE — Progress Notes (Signed)
Refill phenergan

## 2020-07-06 ENCOUNTER — Other Ambulatory Visit: Payer: Self-pay

## 2020-07-06 MED ORDER — BUTALBITAL-APAP-CAFFEINE 50-325-40 MG PO TABS: 1.0000 | ORAL_TABLET | ORAL | 0 refills | Status: DC | PRN

## 2020-07-07 ENCOUNTER — Other Ambulatory Visit: Payer: Self-pay | Admitting: Advanced Practice Midwife

## 2020-07-14 ENCOUNTER — Other Ambulatory Visit: Payer: Self-pay | Admitting: Adult Health

## 2020-07-18 ENCOUNTER — Other Ambulatory Visit: Payer: Medicaid Other | Admitting: *Deleted

## 2020-07-18 ENCOUNTER — Other Ambulatory Visit: Payer: Self-pay | Admitting: Family Medicine

## 2020-07-18 ENCOUNTER — Other Ambulatory Visit (HOSPITAL_COMMUNITY)
Admission: RE | Admit: 2020-07-18 | Discharge: 2020-07-18 | Disposition: A | Discharge status: Home / Self Care | Payer: Medicaid Other | Source: Ambulatory Visit | Attending: Obstetrics & Gynecology | Admitting: Obstetrics & Gynecology

## 2020-07-18 ENCOUNTER — Other Ambulatory Visit: Payer: Self-pay | Admitting: Adult Health

## 2020-07-18 DIAGNOSIS — R300 Dysuria: Secondary | ICD-10-CM

## 2020-07-18 DIAGNOSIS — N898 Other specified noninflammatory disorders of vagina: Secondary | ICD-10-CM | POA: Diagnosis not present

## 2020-07-18 DIAGNOSIS — G43809 Other migraine, not intractable, without status migrainosus: Secondary | ICD-10-CM

## 2020-07-18 DIAGNOSIS — R109 Unspecified abdominal pain: Secondary | ICD-10-CM | POA: Diagnosis not present

## 2020-07-18 MED ORDER — HYDROXYZINE HCL 25 MG PO TABS: 25.0000 mg | ORAL_TABLET | Freq: Four times a day (QID) | ORAL | 3 refills | Status: DC

## 2020-07-18 MED ORDER — SUMATRIPTAN SUCCINATE 25 MG PO TABS: 25.0000 mg | ORAL_TABLET | ORAL | 0 refills | Status: DC | PRN

## 2020-07-18 NOTE — Progress Notes (Signed)
Chart reviewed for nurse visit. Agree with plan of care.  Estill Dooms, NP 07/18/2020 4:46 PM

## 2020-07-18 NOTE — Progress Notes (Signed)
Refilled vistaril  

## 2020-07-18 NOTE — Progress Notes (Signed)
   NURSE VISIT- UTI SYMPTOMS   SUBJECTIVE:  Debbie Santana is a 29 y.o. [id number] female here for UTI symptoms. She is a GYN patient. She reports dysuria, flank pain bilaterally, lower abdominal pain and white vaginal discharge..  OBJECTIVE:  There were no vitals taken for this visit.  Appears well, in no apparent distress  No results found for this or any previous visit (from the past 24 hour(s)).  ASSESSMENT: GYN patient with UTI symptoms and negative nitrites  PLAN: Note routed to Derrek Monaco, AGNP   Rx sent by provider today: NO Urine culture  sent Call or return to clinic prn if these symptoms worsen or fail to improve as anticipated. Follow-up: as needed   Debbie Santana  07/18/2020 3:46 PM

## 2020-07-19 ENCOUNTER — Ambulatory Visit
Payer: Medicaid Other | Attending: Student in an Organized Health Care Education/Training Program | Admitting: Student in an Organized Health Care Education/Training Program

## 2020-07-19 ENCOUNTER — Other Ambulatory Visit: Payer: Self-pay

## 2020-07-19 ENCOUNTER — Encounter: Payer: Self-pay | Admitting: Student in an Organized Health Care Education/Training Program

## 2020-07-19 VITALS — BP 109/70 | HR 74 | Temp 98.2°F | Resp 18 | Ht 68.0 in | Wt 240.0 lb

## 2020-07-19 DIAGNOSIS — G894 Chronic pain syndrome: Secondary | ICD-10-CM

## 2020-07-19 DIAGNOSIS — M5116 Intervertebral disc disorders with radiculopathy, lumbar region: Secondary | ICD-10-CM

## 2020-07-19 DIAGNOSIS — M5416 Radiculopathy, lumbar region: Secondary | ICD-10-CM

## 2020-07-19 DIAGNOSIS — M961 Postlaminectomy syndrome, not elsewhere classified: Secondary | ICD-10-CM

## 2020-07-19 DIAGNOSIS — G8929 Other chronic pain: Secondary | ICD-10-CM

## 2020-07-19 LAB — URINALYSIS, ROUTINE W REFLEX MICROSCOPIC
Bilirubin, UA: NEGATIVE
Glucose, UA: NEGATIVE
Ketones, UA: NEGATIVE
Leukocytes,UA: NEGATIVE
Nitrite, UA: NEGATIVE
Protein,UA: NEGATIVE
RBC, UA: NEGATIVE
Specific Gravity, UA: 1.014 (ref 1.005–1.030)
Urobilinogen, Ur: 0.2 mg/dL (ref 0.2–1.0)
pH, UA: 6 (ref 5.0–7.5)

## 2020-07-19 MED ORDER — TIZANIDINE HCL 4 MG PO TABS
4.0000 mg | ORAL_TABLET | Freq: Every day | ORAL | 1 refills | Status: AC | PRN
Start: 2020-07-19 — End: 2020-09-17

## 2020-07-19 MED ORDER — GABAPENTIN 300 MG PO CAPS: ORAL_CAPSULE | ORAL | 0 refills | Status: DC

## 2020-07-19 NOTE — Progress Notes (Signed)
Patient: Debbie Santana  Service Category: E/M  Provider: Gillis Santa, MD  DOB: [birth year]  DOS: 07/19/2020  Referring Provider: Perlie Mayo, NP  MRN: [id number]  Setting: Ambulatory outpatient  PCP: Perlie Mayo, NP  Type: New Patient  Specialty: Interventional Pain Management    Location: Office  Delivery: Face-to-face     Primary Reason(s) for Visit: Encounter for initial evaluation of one or more chronic problems (new to examiner) potentially causing chronic pain, and posing a threat to normal musculoskeletal function. (Level of risk: High) CC: Back Pain (low) and Abdominal Pain (upper hx ulcers)  HPI  Debbie Santana is a 29 y.o. year old, female patient, who comes for the first time to our practice referred by Perlie Mayo, NP for our initial evaluation of her chronic pain. She has Erosive gastritis; Anxiety; Peptic ulcer disease; Reflux esophagitis; Tobacco abuse; Cigarette smoker; History of abnormal cervical Pap smear; Condyloma; Low back pain; Anxiety and depression; Wheezing on inspiration; Sleep disturbance; Gastroesophageal reflux disease; Alternating constipation and diarrhea; Panic attack; Epigastric pain; Agitated; Marijuana abuse in remission; Hypokalemia; and Obesity (BMI 35.0-39.9 without comorbidity) on their problem list. Today she comes in for evaluation of her Back Pain (low) and Abdominal Pain (upper hx ulcers)  Pain Assessment: Location: Lower Back Radiating: upper abdomen Onset: More than a month ago Duration: Chronic pain Quality: Aching, Burning, Constant, Shooting, Stabbing (excrutiating) Severity: 10-Worst pain ever/10 (subjective, self-reported pain score)  Effect on ADL:  Limits ability to do household chores, work. Timing: Constant Modifying factors: hot baths BP: 109/70  HR: 74  Onset and Duration: Gradual and Present longer than 3 months Cause of pain: Surgery Severity: Getting worse, NAS-11 at its worse: 10/10, NAS-11 at its best: 8/10, NAS-11 now:  10/10 and NAS-11 on the average: 8/10 Timing: Not influenced by the time of the day, During activity or exercise, After activity or exercise and After a period of immobility Aggravating Factors: Bending, Bowel movements, Eating, Lifiting, Motion, Prolonged sitting, Prolonged standing, Surgery made it worse, Walking uphill and Working Alleviating Factors: Hot packs, Lying down, Medications and Warm showers or baths Associated Problems: Day-time cramps, Night-time cramps, Depression, Fatigue, Nausea, Sadness, Spasms, Sweating, Swelling, Pain that wakes patient up and Pain that does not allow patient to sleep Quality of Pain: Agonizing, Annoying, Burning, Deep, Disabling, Distressing, Dreadful, Exhausting, Fearful, Feeling of constriction, Feeling of weight, Getting longer, Heavy, Horrible, Nagging, Pressure-like, Sharp, Shooting, Toothache-like and Uncomfortable Previous Examinations or Tests: Bone scan, CT scan, Endoscopy, X-rays, Neurological evaluation and Psychiatric evaluation Previous Treatments: Narcotic medications   Debbie Santana is a pleasant 29 year old female who is accompanied today by her mother.  She endorses a chief complaint of low back pain with radiation into bilateral lower extremity, right greater than left.  This is been going on for many years.  Of note she had lumbar spine surgery when she was 29 years old for severe low back pain.  It is unclear what was done but most likely was a foraminotomy and discectomy.  She also endorses abdominal pain.  She has a history of gastric ulcers.  She has been told to avoid NSAIDs.  She has not done physical therapy.  She denies having tried gabapentin, Lyrica.  She does have a history of depression and anxiety for which she is on psychotropic medications.  She states that she has difficulty walking at times and on certain days her back locks up.  She states that oxycodone helps her out and that she has  not been able to get a longstanding prescription as  the ED will only prescribe her a few tablets.  She denies having done physical therapy for this condition.  She denies any bowel or bladder weakness.  She has not discussed her current symptoms with her previous neurosurgeon in [place].  She had a lumbar MRI done recently, results of which are below.  Historic Controlled Substance Pharmacotherapy Review  Patient is not on chronic opioid therapy  Historical Monitoring: The patient  reports no history of drug use. List of all UDS Test(s): Lab Results  Component Value Date   COCAINSCRNUR NONE DETECTED 05/19/2020   THCU POSITIVE (A) 05/19/2020   List of other Serum/Urine Drug Screening Test(s):  Lab Results  Component Value Date   COCAINSCRNUR NONE DETECTED 05/19/2020   THCU POSITIVE (A) 05/19/2020   Historical Background Evaluation: [place] PMP: PDMP reviewed during this encounter. Online review of the past 49-monthperiod conducted.              Department of public safety, offender search: (Editor, commissioningInformation) Non-contributory Risk Assessment Profile: Aberrant behavior: None observed or detected today Risk factors for fatal opioid overdose: Age, comorbid psychiatric disease including depression, history of THC use Fatal overdose hazard ratio (HR): Calculation deferred Non-fatal overdose hazard ratio (HR): Calculation deferred Risk of opioid abuse or dependence: 0.7-3.0% with doses ? 36 MME/day and 6.1-26% with doses ? 120 MME/day. Substance use disorder (SUD) risk level: See below Personal History of Substance Abuse (SUD-Substance use disorder):  Alcohol:    Illegal Drugs:    Rx Drugs:    ORT Risk Level calculation:    ORT Scoring interpretation table:  Score <3 = Low Risk for SUD  Score between 4-7 = Moderate Risk for SUD  Score >8 = High Risk for Opioid Abuse   PHQ-2 Depression Scale:  Total score: 0  PHQ-2 Scoring interpretation table: (Score and probability of major depressive disorder)  Score 0 = No depression  Score 1  = 15.4% Probability  Score 2 = 21.1% Probability  Score 3 = 38.4% Probability  Score 4 = 45.5% Probability  Score 5 = 56.4% Probability  Score 6 = 78.6% Probability   PHQ-9 Depression Scale:  Total score: 0  PHQ-9 Scoring interpretation table:  Score 0-4 = No depression  Score 5-9 = Mild depression  Score 10-14 = Moderate depression  Score 15-19 = Moderately severe depression  Score 20-27 = Severe depression (2.4 times higher risk of SUD and 2.89 times higher risk of overuse)   Pharmacologic Plan: Non-opioid analgesic therapy offered.            Initial impression: Poor candidate for opioid analgesics.  Meds   Current Outpatient Medications:  .  albuterol (VENTOLIN HFA) 108 (90 Base) MCG/ACT inhaler, Inhale 1-2 puffs into the lungs every 6 (six) hours as needed for wheezing or shortness of breath., Disp: 8 g, Rfl: 1 .  busPIRone (BUSPAR) 7.5 MG tablet, Take 1 tablet (7.5 mg total) by mouth 3 (three) times daily., Disp: 90 tablet, Rfl: 1 .  butalbital-acetaminophen-caffeine (FIORICET) 50-325-40 MG tablet, TAKE 1 TABLET BY MOUTH EVERY 4 HOURS AS NEEDED FOR HEADACHE, Disp: 20 tablet, Rfl: 0 .  dicyclomine (BENTYL) 10 MG capsule, Take 1 capsule (10 mg total) by mouth 4 (four) times daily -  before meals and at bedtime., Disp: 90 capsule, Rfl: 3 .  etonogestrel (NEXPLANON) 68 MG IMPL implant, 1 each by Subdermal route once., Disp: , Rfl:  .  hydrOXYzine (ATARAX/VISTARIL)  25 MG tablet, Take 1 tablet (25 mg total) by mouth every 6 (six) hours., Disp: 30 tablet, Rfl: 3 .  pantoprazole (PROTONIX) 40 MG tablet, Take 1 tablet (40 mg total) by mouth 2 (two) times daily before a meal., Disp: 60 tablet, Rfl: 1 .  PARoxetine (PAXIL) 40 MG tablet, Take 1 tablet (40 mg total) by mouth every morning., Disp: 30 tablet, Rfl: 6 .  promethazine (PHENERGAN) 25 MG tablet, Take 1 tablet (25 mg total) by mouth every 4 (four) hours as needed for nausea or vomiting., Disp: 30 tablet, Rfl: 1 .  sucralfate  (CARAFATE) 1 g tablet, Take 1 tablet (1 g total) by mouth 3 (three) times daily before meals., Disp: 90 tablet, Rfl: 3 .  SUMAtriptan (IMITREX) 25 MG tablet, Take 1 tablet (25 mg total) by mouth every 2 (two) hours as needed for migraine. May repeat in 2 hours if headache persists or recurs., Disp: 10 tablet, Rfl: 0 .  traZODone (DESYREL) 50 MG tablet, TAKE 1 TABLET BY MOUTH AT BEDTIME, Disp: 30 tablet, Rfl: 0 .  benzonatate (TESSALON) 100 MG capsule, Take 1 capsule (100 mg total) by mouth every 8 (eight) hours. (Patient not taking: Reported on 07/19/2020), Disp: 21 capsule, Rfl: 0 .  gabapentin (NEURONTIN) 300 MG capsule, Take 1 capsule (300 mg total) by mouth at bedtime for 15 days, THEN 2 capsules (600 mg total) at bedtime for 15 days, THEN 3 capsules (900 mg total) at bedtime for 15 days., Disp: 90 capsule, Rfl: 0 .  ondansetron (ZOFRAN ODT) 8 MG disintegrating tablet, Take 1 tablet (8 mg total) by mouth every 8 (eight) hours as needed for nausea or vomiting. (Patient not taking: Reported on 07/19/2020), Disp: 30 tablet, Rfl: 0 .  predniSONE (STERAPRED UNI-PAK 21 TAB) 10 MG (21) TBPK tablet, Take by mouth daily. Take 6 tabs by mouth daily  for 2 days, then 5 tabs for 2 days, then 4 tabs for 2 days, then 3 tabs for 2 days, 2 tabs for 2 days, then 1 tab by mouth daily for 2 days (Patient not taking: Reported on 07/19/2020), Disp: 42 tablet, Rfl: 0 .  tiZANidine (ZANAFLEX) 4 MG tablet, Take 1 tablet (4 mg total) by mouth daily as needed for muscle spasms., Disp: 30 tablet, Rfl: 1  Imaging Review  DG Cervical Spine Complete  Narrative CLINICAL DATA:  Right-sided neck pain for 1 month radiating to right arm.  EXAM: CERVICAL SPINE - COMPLETE 4+ VIEW  COMPARISON:  None.  FINDINGS: There is no evidence of cervical spine fracture or prevertebral soft tissue swelling. Alignment is normal. No other significant bone abnormalities are identified.  IMPRESSION: Negative cervical spine  radiographs.   Electronically Signed By: Earle Gell M.D. On: 01/12/2016 19:05 Narrative CLINICAL DATA:  Chronic thoracic back pain and sciatica. No known injury.  EXAM: THORACIC SPINE 2 VIEWS  COMPARISON:  None.  FINDINGS: There is no evidence of thoracic spine fracture. Alignment is normal. No other significant bone abnormalities are identified.  IMPRESSION: Negative.   Electronically Signed By: Earle Gell M.D. On: 01/12/2016 19:08   Lumbosacral Imaging: Lumbar MR wo contrast: Results for orders placed during the hospital encounter of 12/07/18  MR LUMBAR SPINE WO CONTRAST  Narrative CLINICAL DATA:  Low back pain radiating down both arms for 2 months  EXAM: MRI LUMBAR SPINE WITHOUT CONTRAST  TECHNIQUE: Multiplanar, multisequence MR imaging of the lumbar spine was performed. No intravenous contrast was administered.  COMPARISON:  None.  FINDINGS: Segmentation:  Standard.  Alignment:  Physiologic.  Vertebrae:  No fracture, evidence of discitis, or bone lesion.  Conus medullaris and cauda equina: Conus extends to the 1 level. Conus and cauda equina appear normal.  Paraspinal and other soft tissues: No acute paraspinal abnormality.  Disc levels:  Disc spaces: Degenerative disc disease with disc height loss at L4-5 and L5-S1. Disc desiccation at L2-3, L3-4.  T12-L1: No significant disc bulge. No evidence of neural foraminal stenosis. No central canal stenosis.  L1-L2: No significant disc bulge. No evidence of neural foraminal stenosis. No central canal stenosis.  L2-L3: Broad-based disc bulge. No evidence of neural foraminal stenosis. No central canal stenosis.  L3-L4: Broad central/right paracentral disc protrusion. Right lateral recess narrowing. No evidence of neural foraminal stenosis. No central canal stenosis.  L4-L5: Broad central disc protrusion. Mild bilateral lateral recess narrowing. No evidence of neural foraminal stenosis. No  central canal stenosis.  L5-S1: Broad-based disc bulge. Mild right facet arthropathy. No evidence of neural foraminal stenosis. No central canal stenosis.  IMPRESSION: 1. At L3-4 there is a broad central/right paracentral disc protrusion. Right lateral recess narrowing. 2. At L4-5 there is a broad central disc protrusion. Mild bilateral lateral recess narrowing.   Electronically Signed By: Kathreen Devoid On: 12/07/2018 18:05 DG Lumbar Spine 2-3 Views  Narrative Clinical Data: Lumbar disc herniation  LUMBAR SPINE - 2-3 VIEW  Comparison: MRI 12/25/2007  Findings: The first lateral lumbar spine film demonstrates a spinal needle projecting at the S1 level.  The second film demonstrates surgical retractors and an instrument marking the L4-L5 interspace.  IMPRESSION:  1.  S1 and then, L4-L5 marked intraoperatively.  Provider: Mila Palmer    Narrative *RADIOLOGY REPORT*  Clinical Data: Low back pain.  LUMBAR SPINE - COMPLETE 4+ VIEW  Comparison:  None.  Findings:  There is no evidence of lumbar spine fracture. Alignment is normal.  Intervertebral disc spaces are maintained.  IMPRESSION: Negative.   Ankle-L DG Complete: Results for orders placed during the hospital encounter of 08/20/04  DG Ankle Complete Left  Narrative Clinical Data: Lateral ankle and foot pain status post fall. LEFT ANKLE - 3 VIEW: There is mild lateral soft tissue swelling with evidence of a small ankle joint effusion. No acute fracture or dislocation is demonstrated. There is os trigonum without evidence of acute complicated feature.  Impression Mild soft tissue swelling and small joint effusion. No acute fracture. LEFT FOOT - 3 VIEW: There is no evidence of fracture or dislocation. No other significant bone or soft tissue abnormalities are identified. The joint spaces are within normal limits.  IMPRESSION: Normal study.    Foot-L DG Complete: Results for orders placed during the  hospital encounter of 12/02/11  DG Foot Complete Left  Narrative *RADIOLOGY REPORT*  Clinical Data: Laceration to the plantar surface of the foot. Evaluate for foreign body.  LEFT FOOT - COMPLETE 3+ VIEW  Comparison: 08/20/2004  Findings: No acute fracture or dislocation.  No aggressive osseous lesion.  No radiopaque foreign body identified.  IMPRESSION: No acute osseous abnormality or radiopaque foreign body identified.  Original Report Authenticated By: Lenore Cordia  Hand Imaging: Hand-R DG Complete: Results for orders placed in visit on 01/04/14  DG Hand Complete Right  Narrative DG HAND COMPLETE RIGHT Followup x-rays to review the progress of a fifth metacarpal fracture the right hand  The fracture is showing callus formation despite a persistent fracture line there is mild angulation of the fracture as well.  Impression healing fifth  metacarpal fracture. Mild apex anterior angulation.    Complexity Note: Imaging results reviewed. Results shared with Debbie Santana, using Layman's terms.                         ROS  Cardiovascular: High blood pressure and Needs antibiotics prior to dental procedures Pulmonary or Respiratory: Shortness of breath, Smoking and Coughing up mucus (Bronchitis) Neurological: No reported neurological signs or symptoms such as seizures, abnormal skin sensations, urinary and/or fecal incontinence, being born with an abnormal open spine and/or a tethered spinal cord Psychological-Psychiatric: Anxiousness, Depressed, Prone to panicking and Difficulty sleeping and or falling asleep Gastrointestinal: Vomiting blood (Ulcers), Reflux or heatburn, Alternating episodes iof diarrhea and constipation (IBS-Irritable bowe syndrome), Inflamed pancreas (Pancreatitis) and Irregular, infrequent bowel movements (Constipation) Genitourinary: Passing kidney stones Hematological: No reported hematological signs or symptoms such as prolonged bleeding, low or poor  functioning platelets, bruising or bleeding easily, hereditary bleeding problems, low energy levels due to low hemoglobin or being anemic Endocrine: No reported endocrine signs or symptoms such as high or low blood sugar, rapid heart rate due to high thyroid levels, obesity or weight gain due to slow thyroid or thyroid disease Rheumatologic: No reported rheumatological signs and symptoms such as fatigue, joint pain, tenderness, swelling, redness, heat, stiffness, decreased range of motion, with or without associated rash Musculoskeletal: Negative for myasthenia gravis, muscular dystrophy, multiple sclerosis or malignant hyperthermia Work History: Out of work due to pain  Allergies  Debbie Santana is allergic to nicoderm [nicotine].  Laboratory Chemistry Profile   Renal Lab Results  Component Value Date   BUN 9 06/15/2020   CREATININE 0.86 06/15/2020   LABCREA 69.00 96/22/2979   BCR NOT APPLICABLE 89/[birth year]/1941   GFRAA 105 06/15/2020   GFRNONAA 91 06/15/2020   SPECGRAV 1.014 07/18/2020   PHUR 6.0 07/18/2020   PROTEINUR Negative 07/18/2020     Electrolytes Lab Results  Component Value Date   NA 138 06/15/2020   K 4.5 06/15/2020   CL 106 06/15/2020   CALCIUM 9.6 06/15/2020   MG 1.9 05/22/2020   PHOS 2.9 04/11/2017     Hepatic Lab Results  Component Value Date   AST 12 06/15/2020   ALT 16 06/15/2020   ALBUMIN 3.7 05/20/2020   ALKPHOS 59 05/20/2020   LIPASE 21 05/19/2020     ID Lab Results  Component Value Date   HIV Non Reactive 05/20/2020   SARSCOV2NAA Not Detected 06/15/2020   HCVAB <0.1 04/14/2017   PREGTESTUR NEGATIVE 05/18/2020     Bone No results found for: VD25OH, DE081KG8JEH, UD1497WY6, VZ8588FO2, 25OHVITD1, 25OHVITD2, 25OHVITD3, TESTOFREE, TESTOSTERONE   Endocrine Lab Results  Component Value Date   GLUCOSE 95 06/15/2020   GLUCOSEU Negative 07/18/2020   HGBA1C 5.2 05/19/2020   TSH 0.663 04/18/2017   FREET4 1.36 (H) 04/18/2017     Neuropathy Lab Results   Component Value Date   HGBA1C 5.2 05/19/2020   HIV Non Reactive 05/20/2020     CNS No results found for: COLORCSF, APPEARCSF, RBCCOUNTCSF, WBCCSF, POLYSCSF, LYMPHSCSF, EOSCSF, PROTEINCSF, GLUCCSF, JCVIRUS, CSFOLI, IGGCSF, LABACHR, ACETBL, LABACHR, ACETBL   Inflammation (CRP: Acute  ESR: Chronic) Lab Results  Component Value Date   LATICACIDVEN 1.4 04/11/2017     Rheumatology No results found for: RF, ANA, LABURIC, URICUR, LYMEIGGIGMAB, LYMEABIGMQN, HLAB27   Coagulation Lab Results  Component Value Date   PLT 234 06/15/2020   DDIMER 0.36 10/25/2013     Cardiovascular Lab Results  Component Value Date  HGB 13.8 06/15/2020   HCT 40.3 06/15/2020     Screening Lab Results  Component Value Date   SARSCOV2NAA Not Detected 06/15/2020   HCVAB <0.1 04/14/2017   HIV Non Reactive 05/20/2020   PREGTESTUR NEGATIVE 05/18/2020     Cancer No results found for: CEA, CA125, LABCA2   Allergens No results found for: ALMOND, APPLE, ASPARAGUS, AVOCADO, BANANA, BARLEY, BASIL, BAYLEAF, GREENBEAN, LIMABEAN, WHITEBEAN, BEEFIGE, REDBEET, BLUEBERRY, BROCCOLI, CABBAGE, MELON, CARROT, CASEIN, CASHEWNUT, CAULIFLOWER, CELERY     Note: Lab results reviewed.  [place]  Drug: Debbie Santana  reports no history of drug use. Alcohol:  reports no history of alcohol use. Tobacco:  reports that she has been smoking cigarettes. She has a 2.50 pack-year smoking history. She has never used smokeless tobacco. Medical:  has a past medical history of Anxiety, Bradycardia (04/17/2017), Chest pain on breathing (02/22/2020), Chronic back pain, Depression, Erosive gastritis (04/11/2017), Gastric ulcer, Genital warts, History of gestational hypertension (05/21/2018), History of kidney stones, Intertrigo (11/16/2019), Intractable abdominal pain (04/11/2017), Intractable vomiting (05/20/2020), Irregular intermenstrual bleeding (02/19/2019), Mental disorder, Migraine without aura and without status migrainosus, not intractable  (02/19/2019), Nausea (05/10/2020), Nausea & vomiting (12/07/2018), Nexplanon in place (02/19/2019), Obesity (04/17/2017), Peptic ulcer disease (04/13/2017), Postpartum hypertension (06/23/2018), Sciatica, and Vaginal Pap smear, abnormal. Family: family history includes Anxiety disorder in her mother; Crohn's disease in her sister; Diabetes in her cousin and maternal grandfather; Hyperlipidemia in her mother; Learning disabilities in her cousin.  Past Surgical History:  Procedure Laterality Date  . BACK SURGERY     ruptured disc.  . BIOPSY  04/14/2017   Procedure: BIOPSY;  Surgeon: Rogene Houston, MD;  Location: [facility];  Service: Endoscopy;;  gastric  . BIOPSY  05/22/2020   Procedure: BIOPSY;  Surgeon: Daneil Dolin, MD;  Location: [facility];  Service: Endoscopy;;  . CERVICAL ABLATION N/A 12/02/2018   Procedure: LASER ABLATION OF CERVIX;  Surgeon: Florian Buff, MD;  Location: [facility];  Service: Gynecology;  Laterality: N/A;  . CHOLECYSTECTOMY N/A 04/17/2017   Procedure: LAPAROSCOPIC CHOLECYSTECTOMY;  Surgeon: Aviva Signs, MD;  Location: [facility];  Service: General;  Laterality: N/A;  . COLPOSCOPY    . ESOPHAGOGASTRODUODENOSCOPY (EGD) WITH PROPOFOL N/A 04/14/2017   Procedure: ESOPHAGOGASTRODUODENOSCOPY (EGD) WITH PROPOFOL;  Surgeon: Rogene Houston, MD;  [facility]: [facility];  Service: Endoscopy;  Laterality: N/A;  . ESOPHAGOGASTRODUODENOSCOPY (EGD) WITH PROPOFOL N/A 05/22/2020   Procedure: ESOPHAGOGASTRODUODENOSCOPY (EGD) WITH PROPOFOL;  Surgeon: Daneil Dolin, MD;  Location: [facility];  Service: Endoscopy;  Laterality: N/A;  . HERNIA REPAIR Bilateral    inguinal  . LASER ABLATION CONDOLAMATA N/A 12/02/2018   Procedure: LASER ABLATION CONDYLOMA ACCUMINATA LEFT AND RIGHT VULVA, PERINEUM AND PERIANAL (15 TOTAL);  Surgeon: Florian Buff, MD;  Location: [facility];  Service: Gynecology;  Laterality: N/A;   Active Ambulatory Problems    Diagnosis Date Noted  . Erosive gastritis  04/11/2017  . Anxiety 04/11/2017  . Peptic ulcer disease 04/13/2017  . Reflux esophagitis 04/17/2017  . Tobacco abuse 04/17/2017  . Cigarette smoker 04/26/2017  . History of abnormal cervical Pap smear 11/04/2017  . Condyloma 04/15/2018  . Low back pain 12/07/2018  . Anxiety and depression 02/19/2019  . Wheezing on inspiration 02/22/2020  . Sleep disturbance 02/22/2020  . Gastroesophageal reflux disease 05/10/2020  . Alternating constipation and diarrhea 05/10/2020  . Panic attack 05/18/2020  . Epigastric pain 05/18/2020  . Agitated 05/18/2020  . 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  Hypokalemia 05/19/2020  . Obesity (BMI 35.0-39.9 without comorbidity) 05/20/2020   Resolved Ambulatory Problems    Diagnosis Date Noted  . Spontaneous abortion in first trimester 04/21/2013  . Right hand fracture 10/06/2013  . Fracture of metacarpal shaft of right hand, closed 10/06/2013  . Intractable abdominal pain 04/11/2017  . Leukocytosis 04/11/2017  . Hyperglycemia 04/11/2017  . Nausea and vomiting   . Chronic cholecystitis   . Obesity 04/17/2017  . Bradycardia 04/17/2017  . Supervision of normal pregnancy 11/04/2017  . Admitted to labor and delivery 05/15/2018  . History of gestational hypertension 05/21/2018  . 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 Nausea & vomiting 12/07/2018  . Gastroenteritis 12/07/2018  . Acute lower UTI 12/07/2018  . Dehydration 12/07/2018  . Migraine without aura and without status migrainosus, not intractable 02/19/2019  . Nexplanon in place 02/19/2019  . Irregular intermenstrual bleeding 02/19/2019  . Yeast infection of the skin 09/01/2019  . Intertrigo 11/16/2019  . Chest pain on breathing 02/22/2020  . Nausea 05/10/2020  . Intractable vomiting 05/20/2020   Past Medical History:  Diagnosis Date  . Chronic back pain   . Depression   . Gastric ulcer   . Genital warts   . History of kidney stones   . Mental disorder   . Sciatica   . Vaginal  Pap smear, abnormal    Constitutional Exam  General appearance: alert, cooperative and in mild distress Vitals:   07/19/20 1059  BP: 109/70  Pulse: 74  Resp: 18  Temp: 98.2 F (36.8 C)  TempSrc: Oral  SpO2: 100%  Weight: 240 lb (108.9 kg)  Height: 5' 8"  (1.727 m)   BMI Assessment: Estimated body mass index is 36.49 kg/m as calculated from the following:   Height as of this encounter: 5' 8"  (1.727 m).   Weight as of this encounter: 240 lb (108.9 kg).  BMI interpretation table: BMI level Category Range association with higher incidence of chronic pain  <18 kg/m2 Underweight   18.5-24.9 kg/m2 Ideal body weight   25-29.9 kg/m2 Overweight Increased incidence by 20%  30-34.9 kg/m2 Obese (Class I) Increased incidence by 68%  35-39.9 kg/m2 Severe obesity (Class II) Increased incidence by 136%  >40 kg/m2 Extreme obesity (Class III) Increased incidence by 254%   Patient's current BMI Ideal Body weight  Body mass index is 36.49 kg/m. Ideal body weight: 63.9 kg (140 lb 14 oz) Adjusted ideal body weight: 81.9 kg (180 lb 8.4 oz)   BMI Readings from Last 4 Encounters:  07/19/20 36.49 kg/m  06/15/20 36.74 kg/m  05/25/20 36.49 kg/m  05/19/20 38.01 kg/m   Wt Readings from Last 4 Encounters:  07/19/20 240 lb (108.9 kg)  06/15/20 241 lb 9.6 oz (109.6 kg)  05/25/20 240 lb (108.9 kg)  05/19/20 250 lb (113.4 kg)    Psych/Mental status: Alert, oriented x 3 (person, place, & time)       Eyes: PERLA Respiratory: No evidence of acute respiratory distress   Thoracic Spine Area Exam  Skin & Axial Inspection: No masses, redness, or swelling Alignment: Symmetrical Functional ROM: Unrestricted ROM Stability: No instability detected Muscle Tone/Strength: Functionally intact. No obvious neuro-muscular anomalies detected. Sensory (Neurological): Unimpaired Muscle strength & Tone: No palpable anomalies  Lumbar Exam  Skin & Axial Inspection: Well healed scar from previous spine surgery  detected Alignment: Symmetrical Functional ROM: Pain restricted ROM affecting both sides Stability: No instability detected Muscle Tone/Strength: Functionally intact. No obvious neuro-muscular anomalies detected. Sensory (Neurological): Dermatomal pain pattern Palpation: No palpable anomalies  Provocative Tests: Hyperextension/rotation test: (+) bilaterally for facet joint pain. Lumbar quadrant test (Kemp's test): deferred today       Lateral bending test: (+) ipsilateral radicular pain, bilaterally. Positive for bilateral foraminal stenosis.  Right greater than left Patrick's Maneuver: deferred today                   FABER* test: deferred today                   S-I anterior distraction/compression test: deferred today         S-I lateral compression test: deferred today         S-I Thigh-thrust test: deferred today         S-I Gaenslen's test: deferred today         *(Flexion, ABduction and External Rotation)  Gait & Posture Assessment  Ambulation: Unassisted Gait: Relatively normal for age and body habitus Posture: WNL   Lower Extremity Exam    Side: Right lower extremity  Side: Left lower extremity  Stability: No instability observed          Stability: No instability observed          Skin & Extremity Inspection: Skin color, temperature, and hair growth are WNL. No peripheral edema or cyanosis. No masses, redness, swelling, asymmetry, or associated skin lesions. No contractures.  Skin & Extremity Inspection: Skin color, temperature, and hair growth are WNL. No peripheral edema or cyanosis. No masses, redness, swelling, asymmetry, or associated skin lesions. No contractures.  Functional ROM: Pain restricted ROM for hip and knee joints          Functional ROM: Pain restricted ROM for hip and knee joints          Muscle Tone/Strength: Functionally intact. No obvious neuro-muscular anomalies detected.  Muscle Tone/Strength: Functionally intact. No obvious neuro-muscular  anomalies detected.  Sensory (Neurological): Unimpaired        Sensory (Neurological): Unimpaired        DTR: Patellar: deferred today Achilles: deferred today Plantar: deferred today  DTR: Patellar: deferred today Achilles: deferred today Plantar: deferred today  Palpation: No palpable anomalies  Palpation: No palpable anomalies   Assessment  Primary Diagnosis & Pertinent Problem List: The primary encounter diagnosis was Failed back surgical syndrome. 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  Visit Diagnosis (New problems to examiner): 1. Failed back surgical syndrome   2. Post laminectomy syndrome   3. Chronic radicular lumbar pain   4. 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 Chronic pain syndrome    Plan of Care (Initial workup plan)   General Recommendations: The pain condition that the patient suffers from is best treated with a multidisciplinary approach that involves an increase in physical activity to prevent de-conditioning and worsening of the pain cycle, as well as psychological counseling (formal and/or informal) to address the co-morbid psychological affects of pain. Treatment will often involve judicious use of pain medications and interventional procedures to decrease the pain, allowing the patient to participate in the physical activity that will ultimately produce long-lasting pain reductions. The goal of the multidisciplinary approach is to return the patient to a higher level of overall function and to restore their ability to perform activities of daily living.  I had extensive discussion with the patient regarding her therapeutic options.  I was very clear with the patient and  informed her that chronic opioid therapy is not recommended for her condition given her age, history of depression and anxiety, prior THC use.   Furthermore I spent a great deal of time reviewing her lumbar MRI which shows right L3-L4 and L4-L5 broad central right paracentral disc protrusion which is causing some right lateral recess stress narrowing however no foraminal stenosis.  I recommend physical therapy for this condition.  If that is not effective, we can consider a lumbar epidural steroid injection.  Also recommend gabapentin titration as below.  Tizanidine as needed for muscle spasms.   Referral Orders     Ambulatory referral to Physical Therapy Pharmacotherapy (current): Medications ordered:  Meds ordered this encounter  Medications  . gabapentin (NEURONTIN) 300 MG capsule    Sig: Take 1 capsule (300 mg total) by mouth at bedtime for 15 days, THEN 2 capsules (600 mg total) at bedtime for 15 days, THEN 3 capsules (900 mg total) at bedtime for 15 days.    Dispense:  90 capsule    Refill:  0    Fill one day early if pharmacy is closed on scheduled refill date. May substitute for generic if available.  Marland Kitchen tiZANidine (ZANAFLEX) 4 MG tablet    Sig: Take 1 tablet (4 mg total) by mouth daily as needed for muscle spasms.    Dispense:  30 tablet    Refill:  1    Do not place this medication, or any other prescription from our practice, on "Automatic Refill". Patient may have prescription filled one day early if pharmacy is closed on scheduled refill date.   Medications administered during this visit: Nakiya L. Cubillos had no medications administered during this visit.   Pharmacological management options:  Opioid Analgesics: Not a candidate, will avoid.  Comorbid depression, anxiety, previous THC use.  Membrane stabilizer: Trial of gabapentin as above.  Future considerations include Lyrica  Muscle relaxant: Trial of tizanidine as above  NSAID: To be determined at a later time  Other analgesic(s): To be determined at a later time   Interventional management options: Debbie Santana was informed that there is no guarantee that she would  be a candidate for interventional therapies. The decision will be based on the results of diagnostic studies, as well as Debbie Santana's risk profile.  Procedure(s) under consideration:  Lumbar epidural steroid injection   Provider-requested follow-up: Return in about 5 weeks (around 08/23/2020) for Medication Management, in person.  Future Appointments  Date Time Provider [place]  07/27/2020  1:15 PM Minus Liberty, PA-C [facility] None  08/10/2020 10:30 AM Estill Dooms, NP [facility]  08/15/2020  9:40 AM Perlie Mayo, NP [facility]  08/23/2020  1:20 PM Gillis Santa, MD [facility] None    Note by: Gillis Santa, MD Date: 07/19/2020; Time: 3:04 PM

## 2020-07-19 NOTE — Progress Notes (Signed)
Safety precautions to be maintained throughout the outpatient stay will include: orient to surroundings, keep bed in low position, maintain call bell within reach at all times, provide assistance with transfer out of bed and ambulation.  

## 2020-07-20 LAB — CERVICOVAGINAL ANCILLARY ONLY
Bacterial Vaginitis (gardnerella): POSITIVE — AB
Candida Glabrata: NEGATIVE
Candida Vaginitis: NEGATIVE
Chlamydia: NEGATIVE
Comment: NEGATIVE
Comment: NEGATIVE
Comment: NEGATIVE
Comment: NEGATIVE
Comment: NEGATIVE
Comment: NORMAL
Neisseria Gonorrhea: NEGATIVE
Trichomonas: NEGATIVE

## 2020-07-21 ENCOUNTER — Other Ambulatory Visit: Payer: Self-pay | Admitting: Adult Health

## 2020-07-21 LAB — URINE CULTURE

## 2020-07-21 MED ORDER — METRONIDAZOLE 500 MG PO TABS: 500.0000 mg | ORAL_TABLET | Freq: Two times a day (BID) | ORAL | 0 refills | Status: DC

## 2020-07-21 NOTE — Progress Notes (Signed)
CV swab +BV rx flagyl

## 2020-07-24 ENCOUNTER — Other Ambulatory Visit: Payer: Self-pay | Admitting: Adult Health

## 2020-07-25 ENCOUNTER — Other Ambulatory Visit: Payer: Self-pay

## 2020-07-26 ENCOUNTER — Telehealth: Payer: Self-pay | Admitting: *Deleted

## 2020-07-26 NOTE — Telephone Encounter (Signed)
Pt is requesting a refill on fioricet. Is upset that is has been denied. States that this is an ongoing prescription that Anderson Malta gives her. Aware Anderson Malta is out of the office.

## 2020-07-27 ENCOUNTER — Ambulatory Visit (INDEPENDENT_AMBULATORY_CARE_PROVIDER_SITE_OTHER): Payer: Medicaid Other | Admitting: Gastroenterology

## 2020-07-27 ENCOUNTER — Encounter (INDEPENDENT_AMBULATORY_CARE_PROVIDER_SITE_OTHER): Payer: Self-pay | Admitting: Gastroenterology

## 2020-07-27 ENCOUNTER — Other Ambulatory Visit: Payer: Self-pay

## 2020-07-27 VITALS — BP 109/74 | HR 103 | Temp 99.4°F | Ht 68.0 in | Wt 235.7 lb

## 2020-07-27 DIAGNOSIS — R197 Diarrhea, unspecified: Secondary | ICD-10-CM

## 2020-07-27 MED ORDER — HYOSCYAMINE SULFATE SL 0.125 MG SL SUBL: 0.1250 mg | SUBLINGUAL_TABLET | Freq: Three times a day (TID) | SUBLINGUAL | 1 refills | Status: DC | PRN

## 2020-07-27 NOTE — Patient Instructions (Signed)
We are checking labs and stool studies for evaluation. We will contact you with results.

## 2020-07-27 NOTE — Progress Notes (Signed)
Patient profile: Debbie Santana is a 29 y.o. female seen for follow up of GERD.  She was last seen September 2021 for GERD.  She had previously been seen in August 2021 in the emergency room for nausea vomiting abdominal pain.  She had an endoscopy while she was admitted.  She continued to have epigastric pain with PPI twice daily and was started on Carafate at last office visit.  History of Present Illness: Debbie Santana is seen today for follow-up.  She reports since her last visit taking Carafate twice a day which has significantly helped her epigastric pain. She is no longer having epigastric pain but does seem to have some GERD symptoms with acid in her throat and occasional vomiting -notes this to be worse if she eats late.  Reports past vomiting during the middle of the night few times a week.  She tries not to eat late.  She has had significant stress related to her recent separation.  She feels that she is generally not eating well and has lost some weight as below.  She relates the weight loss more to stress than postprandial abdominal pain or diarrhea.  She notes since her last visit having worsening diarrhea.  Reports averages 6 bowel movements a day that are loose liquid consistency. She typically has 3-4 bowel movements in the morning then a few additional loose stools throughout the day.  She denies any blood in stool. Occasional abdominal cramping but diarrhea definitely more significant. Reports she has tried dicyclomine in the past and did not significantly help symptoms but also diarrhea seems worse at this time than it was.  Smokes 1 pack a day, denies alcohol and NSAIDs.  Currently on Flagyl for BV.  Wt Readings from Last 3 Encounters:  07/27/20 235 lb 11.2 oz (106.9 kg)  07/19/20 240 lb (108.9 kg)  06/15/20 241 lb 9.6 oz (109.6 kg)      Last Endoscopy: 04/2020--Erosive reflux esophagitis. Gastric erosions?status post biopsy; normal duodenal bulb and second portion of the  duodenum. -Clinically, she is much improved. Poorly controlled reflux lately. Cannot rule out recent bout of viral gastroenteritis. NSAID use may have exacerbated A. STOMACH, BIOPSY:  - Mild chronic gastritis. Mild reactive gastropathy.  - Warthin-Starry stain is negative for Helicobacter pylori.   Past Medical History:  Past Medical History:  Diagnosis Date  . Anxiety   . Bradycardia 04/17/2017  . Chest pain on breathing 02/22/2020  . Chronic back pain   . Depression   . 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 Gastric ulcer   . Genital warts   . History of gestational hypertension 05/21/2018   Dx intrapartum  . History of kidney stones   . Intertrigo 11/16/2019  . Intractable abdominal pain 04/11/2017  . Intractable vomiting 05/20/2020  . Irregular intermenstrual bleeding 02/19/2019  . Mental disorder   . Migraine without aura and without status migrainosus, not intractable 02/19/2019  . Nausea 05/10/2020  . Nausea & vomiting 12/07/2018  . Nexplanon in place 02/19/2019  . Obesity 04/17/2017  . Peptic ulcer disease 04/13/2017  . Postpartum hypertension 06/23/2018   requiring norvasc, resolved at pp visit  . Sciatica   . Vaginal Pap smear, abnormal     Problem List: Patient Active Problem List   Diagnosis Date Noted  . Obesity (BMI 35.0-39.9 without comorbidity) 05/20/2020  . 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 Hypokalemia 05/19/2020  . Panic attack 05/18/2020  . Epigastric pain 05/18/2020  . Agitated 05/18/2020  .  Gastroesophageal reflux disease 05/10/2020  . Alternating constipation and diarrhea 05/10/2020  . Wheezing on inspiration 02/22/2020  . Sleep disturbance 02/22/2020  . Anxiety and depression 02/19/2019  . Low back pain 12/07/2018  . Condyloma 04/15/2018  . History of abnormal cervical Pap smear 11/04/2017  . Cigarette smoker 04/26/2017  . Reflux esophagitis 04/17/2017  . Tobacco abuse 04/17/2017  . Peptic ulcer disease 04/13/2017  . Erosive gastritis 04/11/2017    . Anxiety 04/11/2017    Past Surgical History: Past Surgical History:  Procedure Laterality Date  . BACK SURGERY     ruptured disc.  . BIOPSY  04/14/2017   Procedure: BIOPSY;  Surgeon: Rogene Houston, MD;  [facility]: [facility];  Service: Endoscopy;;  gastric  . BIOPSY  05/22/2020   Procedure: BIOPSY;  Surgeon: Daneil Dolin, MD;  Location: [facility];  Service: Endoscopy;;  . CERVICAL ABLATION N/A 12/02/2018   Procedure: LASER ABLATION OF CERVIX;  Surgeon: Florian Buff, MD;  Location: [facility];  Service: Gynecology;  Laterality: N/A;  . CHOLECYSTECTOMY N/A 04/17/2017   Procedure: LAPAROSCOPIC CHOLECYSTECTOMY;  Surgeon: Aviva Signs, MD;  Location: [facility];  Service: General;  Laterality: N/A;  . COLPOSCOPY    . ESOPHAGOGASTRODUODENOSCOPY (EGD) WITH PROPOFOL N/A 04/14/2017   Procedure: ESOPHAGOGASTRODUODENOSCOPY (EGD) WITH PROPOFOL;  Surgeon: Rogene Houston, MD;  [facility]: [facility];  Service: Endoscopy;  Laterality: N/A;  . ESOPHAGOGASTRODUODENOSCOPY (EGD) WITH PROPOFOL N/A 05/22/2020   Procedure: ESOPHAGOGASTRODUODENOSCOPY (EGD) WITH PROPOFOL;  Surgeon: Daneil Dolin, MD;  [facility]: [facility];  Service: Endoscopy;  Laterality: N/A;  . HERNIA REPAIR Bilateral    inguinal  . LASER ABLATION CONDOLAMATA N/A 12/02/2018   Procedure: LASER ABLATION CONDYLOMA ACCUMINATA LEFT AND RIGHT VULVA, PERINEUM AND PERIANAL (15 TOTAL);  Surgeon: Florian Buff, MD;  Location: [facility];  Service: Gynecology;  Laterality: N/A;    Allergies: Allergies  Allergen Reactions  . Nicoderm [Nicotine] Itching    Sharp pain at site      Home Medications:  Current Outpatient Medications:  .  albuterol (VENTOLIN HFA) 108 (90 Base) MCG/ACT inhaler, Inhale 1-2 puffs into the lungs every 6 (six) hours as needed for wheezing or shortness of breath., Disp: 8 g, Rfl: 1 .  busPIRone (BUSPAR) 7.5 MG tablet, Take 1 tablet (7.5 mg total) by mouth 3 (three) times daily., Disp: 90 tablet, Rfl: 1 .   butalbital-acetaminophen-caffeine (FIORICET) 50-325-40 MG tablet, TAKE 1 TABLET BY MOUTH EVERY 4 HOURS AS NEEDED FOR HEADACHE, Disp: 20 tablet, Rfl: 0 .  etonogestrel (NEXPLANON) 68 MG IMPL implant, 1 each by Subdermal route once., Disp: , Rfl:  .  gabapentin (NEURONTIN) 300 MG capsule, Take 1 capsule (300 mg total) by mouth at bedtime for 15 days, THEN 2 capsules (600 mg total) at bedtime for 15 days, THEN 3 capsules (900 mg total) at bedtime for 15 days., Disp: 90 capsule, Rfl: 0 .  hydrOXYzine (ATARAX/VISTARIL) 25 MG tablet, Take 1 tablet (25 mg total) by mouth every 6 (six) hours., Disp: 30 tablet, Rfl: 3 .  metroNIDAZOLE (FLAGYL) 500 MG tablet, Take 1 tablet (500 mg total) by mouth 2 (two) times daily., Disp: 14 tablet, Rfl: 0 .  pantoprazole (PROTONIX) 40 MG tablet, Take 1 tablet (40 mg total) by mouth 2 (two) times daily before a meal., Disp: 60 tablet, Rfl: 1 .  PARoxetine (PAXIL) 40 MG tablet, Take 1 tablet (40 mg total) by mouth every morning., Disp: 30 tablet, Rfl: 6 .  promethazine (  PHENERGAN) 25 MG tablet, Take 1 tablet (25 mg total) by mouth every 4 (four) hours as needed for nausea or vomiting., Disp: 30 tablet, Rfl: 1 .  sucralfate (CARAFATE) 1 g tablet, Take 1 tablet (1 g total) by mouth 3 (three) times daily before meals., Disp: 90 tablet, Rfl: 3 .  tiZANidine (ZANAFLEX) 4 MG tablet, Take 1 tablet (4 mg total) by mouth daily as needed for muscle spasms., Disp: 30 tablet, Rfl: 1 .  traZODone (DESYREL) 50 MG tablet, TAKE 1 TABLET BY MOUTH AT BEDTIME, Disp: 30 tablet, Rfl: 0 .  dicyclomine (BENTYL) 10 MG capsule, Take 1 capsule (10 mg total) by mouth 4 (four) times daily -  before meals and at bedtime. (Patient not taking: Reported on 07/27/2020), Disp: 90 capsule, Rfl: 3 .  Hyoscyamine Sulfate SL (LEVSIN/SL) 0.125 MG SUBL, Place 0.125 mg under the tongue 3 (three) times daily as needed (diarrhea)., Disp: 90 tablet, Rfl: 1   Family History: family history includes Anxiety disorder in her  mother; Crohn's disease in her sister; Diabetes in her cousin and maternal grandfather; Hyperlipidemia in her mother; Learning disabilities in her cousin.    Social History:   reports that she has been smoking cigarettes. She has a 2.50 pack-year smoking history. She has never used smokeless tobacco. She reports that she does not drink alcohol and does not use drugs.   Review of Systems: Constitutional: Denies weight loss/weight gain  Eyes: No changes in vision. ENT: No oral lesions, sore throat.  GI: see HPI.  Heme/Lymph: No easy bruising.  CV: No chest pain.  GU: No hematuria.  Integumentary: No rashes.  Neuro: No headaches.  Psych: No depression/anxiety.  Endocrine: No heat/cold intolerance.  Allergic/Immunologic: No urticaria.  Resp: No cough, SOB.  Musculoskeletal: No joint swelling.    Physical Examination: BP 109/74 (BP Location: Right Arm, Patient Position: Sitting, Cuff Size: Normal)   Pulse (!) 103   Temp 99.4 F (37.4 C) (Oral)   Ht 5' 8"  (1.727 m)   Wt 235 lb 11.2 oz (106.9 kg)   BMI 35.84 kg/m  Gen: NAD, alert and oriented x 4 HEENT: PEERLA, EOMI, Neck: supple, no JVD Chest: Wheezing present bilateral upper and lower lobes CV: RRR, no m/g/c/r Abd: soft, NT, ND, +BS in all four quadrants; no HSM, guarding, ridigity, or rebound tenderness Ext: no edema, well perfused with 2+ pulses, Skin: no rash or lesions noted on observed skin Lymph: no noted LAD  Data Reviewed:   06/15/20-CMP normal, CBC normal  Assessment/Plan: Ms. Stlouis is a 29 y.o. female seen for follow-up  1.  Epigastric pain-improved with PPI and Carafate twice daily.  We will continue current regimen.  She has had an upper endoscopy.  Reports currently under a lot of stress which worsens symptoms and may have degree of dyspepsia.  She does not use NSAIDs frequently anymore.  2.  Diarrhea-new complaint over the past 1 month with up to 6 bowel movements a day.  Does note her sister was recently  diagnosed with Crohn's disease. Will check inflammatory markers and stool studies.  If no evidence of infection well recommend a colonoscopy for evaluation.  We will give her Levsin to try in the interim.  In the event of colonoscopy she denies any prior issues with sedation. Patient denies CP, SOB, and use of blood thinners. I discussed the risks and benefits of procedure including bleeding, perforation, infection, missed lesions, medication reactions and possible hospitalization or surgery if complications. All questions answered.  Lahna was seen today for follow-up.  Diagnoses and all orders for this visit:  Diarrhea, unspecified type -     Gastrointestinal Pathogen Panel PCR -     C. difficile GDH and Toxin A/B -     CBC with Differential -     COMPLETE METABOLIC PANEL WITH GFR -     C-reactive protein -     Sed Rate (ESR)  Other orders -     Hyoscyamine Sulfate SL (LEVSIN/SL) 0.125 MG SUBL; Place 0.125 mg under the tongue 3 (three) times daily as needed (diarrhea).      I personally performed the service, non-incident to. (WP)  Laurine Blazer, [facility]

## 2020-07-28 LAB — CBC WITH DIFFERENTIAL/PLATELET
Absolute Monocytes: 550 cells/uL (ref 200–950)
Basophils Absolute: 22 cells/uL (ref 0–200)
Basophils Relative: 0.2 %
Eosinophils Absolute: 77 cells/uL (ref 15–500)
Eosinophils Relative: 0.7 %
HCT: 44 % (ref 35.0–45.0)
Hemoglobin: 15 g/dL (ref 11.7–15.5)
Lymphs Abs: 2871 cells/uL (ref 850–3900)
MCH: 31.6 pg (ref 27.0–33.0)
MCHC: 34.1 g/dL (ref 32.0–36.0)
MCV: 92.8 fL (ref 80.0–100.0)
MPV: 11.8 fL (ref 7.5–12.5)
Monocytes Relative: 5 %
Neutro Abs: 7480 cells/uL (ref 1500–7800)
Neutrophils Relative %: 68 %
Platelets: 260 10*3/uL (ref 140–400)
RBC: 4.74 10*6/uL (ref 3.80–5.10)
RDW: 12.4 % (ref 11.0–15.0)
Total Lymphocyte: 26.1 %
WBC: 11 10*3/uL — ABNORMAL HIGH (ref 3.8–10.8)

## 2020-07-28 LAB — COMPLETE METABOLIC PANEL WITH GFR
AG Ratio: 1.8 (calc) (ref 1.0–2.5)
ALT: 14 U/L (ref 6–29)
AST: 15 U/L (ref 10–30)
Albumin: 4.2 g/dL (ref 3.6–5.1)
Alkaline phosphatase (APISO): 68 U/L (ref 31–125)
BUN: 7 mg/dL (ref 7–25)
CO2: 25 mmol/L (ref 20–32)
Calcium: 9.7 mg/dL (ref 8.6–10.2)
Chloride: 106 mmol/L (ref 98–110)
Creat: 0.71 mg/dL (ref 0.50–1.10)
GFR, Est African American: 132 mL/min/{1.73_m2} (ref >=60)
GFR, Est Non African American: 114 mL/min/{1.73_m2} (ref >=60)
Globulin: 2.3 g/dL (calc) (ref 1.9–3.7)
Glucose, Bld: 79 mg/dL (ref 65–99)
Potassium: 4.1 mmol/L (ref 3.5–5.3)
Sodium: 139 mmol/L (ref 135–146)
Total Bilirubin: 0.4 mg/dL (ref 0.2–1.2)
Total Protein: 6.5 g/dL (ref 6.1–8.1)

## 2020-07-28 LAB — C-REACTIVE PROTEIN: CRP: 6.3 mg/L (ref <8.0)

## 2020-07-28 LAB — SEDIMENTATION RATE: Sed Rate: 2 mm/h (ref 0–20)

## 2020-07-31 ENCOUNTER — Other Ambulatory Visit: Payer: Self-pay | Admitting: Women's Health

## 2020-07-31 ENCOUNTER — Other Ambulatory Visit: Payer: Self-pay | Admitting: Family Medicine

## 2020-07-31 DIAGNOSIS — F419 Anxiety disorder, unspecified: Secondary | ICD-10-CM

## 2020-07-31 DIAGNOSIS — R197 Diarrhea, unspecified: Secondary | ICD-10-CM | POA: Diagnosis not present

## 2020-07-31 MED ORDER — BUTALBITAL-APAP-CAFFEINE 50-325-40 MG PO TABS: 1.0000 | ORAL_TABLET | Freq: Four times a day (QID) | ORAL | 0 refills | Status: DC | PRN

## 2020-08-03 ENCOUNTER — Encounter: Payer: Self-pay | Admitting: *Deleted

## 2020-08-03 LAB — C. DIFFICILE GDH AND TOXIN A/B
GDH ANTIGEN: NOT DETECTED
MICRO NUMBER:: 11174951
SPECIMEN QUALITY:: ADEQUATE
TOXIN A AND B: NOT DETECTED

## 2020-08-03 LAB — GASTROINTESTINAL PATHOGEN PANEL PCR

## 2020-08-07 ENCOUNTER — Telehealth (INDEPENDENT_AMBULATORY_CARE_PROVIDER_SITE_OTHER): Payer: Self-pay

## 2020-08-07 ENCOUNTER — Encounter (INDEPENDENT_AMBULATORY_CARE_PROVIDER_SITE_OTHER): Payer: Self-pay

## 2020-08-07 ENCOUNTER — Other Ambulatory Visit (INDEPENDENT_AMBULATORY_CARE_PROVIDER_SITE_OTHER): Payer: Self-pay | Admitting: Gastroenterology

## 2020-08-07 ENCOUNTER — Other Ambulatory Visit (INDEPENDENT_AMBULATORY_CARE_PROVIDER_SITE_OTHER): Payer: Self-pay

## 2020-08-07 NOTE — Progress Notes (Signed)
I will put in orders for colonoscopy with Dr Lucianne Muss.  Can call to schedule Thanks!

## 2020-08-08 ENCOUNTER — Other Ambulatory Visit: Payer: Self-pay

## 2020-08-08 DIAGNOSIS — F419 Anxiety disorder, unspecified: Secondary | ICD-10-CM

## 2020-08-08 MED ORDER — BUSPIRONE HCL 7.5 MG PO TABS: 7.5000 mg | ORAL_TABLET | Freq: Three times a day (TID) | ORAL | 1 refills | Status: DC

## 2020-08-08 MED ORDER — SUPREP BOWEL PREP KIT 17.5-3.13-1.6 GM/177ML PO SOLN: 1.0000 | ORAL | 0 refills | Status: DC

## 2020-08-08 NOTE — Telephone Encounter (Signed)
Bowel prep

## 2020-08-10 ENCOUNTER — Encounter: Payer: Self-pay | Admitting: Adult Health

## 2020-08-10 ENCOUNTER — Other Ambulatory Visit: Payer: Self-pay

## 2020-08-10 ENCOUNTER — Ambulatory Visit (INDEPENDENT_AMBULATORY_CARE_PROVIDER_SITE_OTHER): Payer: Medicaid Other | Admitting: Adult Health

## 2020-08-10 VITALS — BP 118/79 | HR 94 | Ht 68.0 in | Wt 238.0 lb

## 2020-08-10 DIAGNOSIS — F419 Anxiety disorder, unspecified: Secondary | ICD-10-CM | POA: Diagnosis not present

## 2020-08-10 DIAGNOSIS — R519 Headache, unspecified: Secondary | ICD-10-CM | POA: Insufficient documentation

## 2020-08-10 DIAGNOSIS — F32A Depression, unspecified: Secondary | ICD-10-CM

## 2020-08-10 DIAGNOSIS — G43909 Migraine, unspecified, not intractable, without status migrainosus: Secondary | ICD-10-CM | POA: Insufficient documentation

## 2020-08-10 DIAGNOSIS — F410 Panic disorder [episodic paroxysmal anxiety] without agoraphobia: Secondary | ICD-10-CM | POA: Diagnosis not present

## 2020-08-10 DIAGNOSIS — Z975 Presence of (intrauterine) contraceptive device: Secondary | ICD-10-CM | POA: Diagnosis not present

## 2020-08-10 DIAGNOSIS — N921 Excessive and frequent menstruation with irregular cycle: Secondary | ICD-10-CM

## 2020-08-10 MED ORDER — BUSPIRONE HCL 15 MG PO TABS: 15.0000 mg | ORAL_TABLET | Freq: Three times a day (TID) | ORAL | 6 refills | Status: DC

## 2020-08-10 MED ORDER — BUTALBITAL-APAP-CAFFEINE 50-325-40 MG PO TABS: 1.0000 | ORAL_TABLET | Freq: Four times a day (QID) | ORAL | 0 refills | Status: DC | PRN

## 2020-08-10 MED ORDER — MEGESTROL ACETATE 40 MG PO TABS: ORAL_TABLET | ORAL | 3 refills | Status: DC

## 2020-08-10 MED ORDER — PAROXETINE HCL 40 MG PO TABS: 40.0000 mg | ORAL_TABLET | ORAL | 6 refills | Status: DC

## 2020-08-10 MED ORDER — HYDROXYZINE HCL 25 MG PO TABS: 25.0000 mg | ORAL_TABLET | Freq: Four times a day (QID) | ORAL | 3 refills | Status: DC

## 2020-08-10 NOTE — Progress Notes (Signed)
  Subjective:     Patient ID: Debbie Santana, female   DOB: Jul 14, 1991, 29 y.o.   MRN: [id number]  HPI Debbie Santana is a 29 year old white female,single, [id number] in for panic attacks is out of Buspar and having irregular bleeding, with nexplanon. Feels stressed and having more headaches.  PCP is Cherly Beach NP  Review of Systems  Have irregular bleeding with nexplanon She says she is stressed and has increase in headaches +panic attacks   Reviewed past medical,surgical, social and family history. Reviewed medications and allergies.     Objective:   Physical Exam BP 118/79 (BP Location: Left Arm, Patient Position: Sitting, Cuff Size: Large)   Pulse 94   Ht 5\' 8"  (1.727 m)   Wt 238 lb (108 kg)   LMP 08/07/2020   BMI 36.19 kg/m  Skin warm and dry.  Lungs: clear to ausculation bilaterally. Cardiovascular: regular rate and rhythm.   Fall risk is moderate PHQ 9 score is 22, no SI, is on meds and needs follow up with [facility]  Assessment:     1. Anxiety and depression Will increase buspar for anxiety Refill paxil, she has seen [facility] once, but she needs to call them for follow up   Meds ordered this encounter  Medications  . busPIRone (BUSPAR) 15 MG tablet    Sig: Take 1 tablet (15 mg total) by mouth 3 (three) times daily.    Dispense:  90 tablet    Refill:  6    Order Specific Question:   Supervising Provider    Answer:   Elonda Husky, LUTHER H [[id number]]  . PARoxetine (PAXIL) 40 MG tablet    Sig: Take 1 tablet (40 mg total) by mouth every morning.    Dispense:  30 tablet    Refill:  6    Order Specific Question:   Supervising Provider    Answer:   Elonda Husky, LUTHER H [[id number]]  . hydrOXYzine (ATARAX/VISTARIL) 25 MG tablet    Sig: Take 1 tablet (25 mg total) by mouth every 6 (six) hours.    Dispense:  30 tablet    Refill:  3    Order Specific Question:   Supervising Provider    Answer:   Elonda Husky, LUTHER H [[id number]]  . butalbital-acetaminophen-caffeine (FIORICET) 50-325-40 MG tablet    Sig: Take 1  tablet by mouth every 6 (six) hours as needed for headache.    Dispense:  20 tablet    Refill:  0    Order Specific Question:   Supervising Provider    Answer:   Elonda Husky, LUTHER H [[id number]]  . megestrol (MEGACE) 40 MG tablet    Sig: Take 2 daily    Dispense:  60 tablet    Refill:  3    Order Specific Question:   Supervising Provider    Answer:   Elonda Husky, LUTHER H [[id number]]    2. Panic attack Refill vistaril   3. Irregular intermenstrual bleeding Will try megace to stop bleeding   4. Nexplanon in place   5. Frequent headaches Will refill Fioricet     Plan:    Call [facility] in follow up  Return in 4 weeks for pap and physical

## 2020-08-14 ENCOUNTER — Encounter: Payer: Self-pay | Admitting: Student in an Organized Health Care Education/Training Program

## 2020-08-15 ENCOUNTER — Other Ambulatory Visit: Payer: Self-pay

## 2020-08-15 ENCOUNTER — Encounter: Payer: Self-pay | Admitting: Family Medicine

## 2020-08-15 ENCOUNTER — Telehealth (INDEPENDENT_AMBULATORY_CARE_PROVIDER_SITE_OTHER): Payer: Medicaid Other | Admitting: Family Medicine

## 2020-08-15 VITALS — Ht 68.0 in | Wt 235.0 lb

## 2020-08-15 DIAGNOSIS — M5440 Lumbago with sciatica, unspecified side: Secondary | ICD-10-CM

## 2020-08-15 DIAGNOSIS — G8929 Other chronic pain: Secondary | ICD-10-CM | POA: Diagnosis not present

## 2020-08-15 DIAGNOSIS — F419 Anxiety disorder, unspecified: Secondary | ICD-10-CM

## 2020-08-15 NOTE — Patient Instructions (Addendum)
  HAPPY FALL!  I appreciate the opportunity to provide you with care for your health and wellness. Today we discussed: back pain and anxiety   Follow up: 6 months - w/ Legrand Como   No labs  Referral back to Dr Lollie Sails office today Continue with pain clinic  Please call [facility] to get set up for appt.  Have a good Holiday Season!!  Please continue to practice social distancing to keep you, your family, and our community safe.  If you must go out, please wear a mask and practice good handwashing.  It was a pleasure to see you and I look forward to continuing to work together on your health and well-being. Please do not hesitate to call the office if you need care or have questions about your care.  Have a wonderful day and week. With Gratitude, Cherly Beach, DNP, AGNP-BC

## 2020-08-15 NOTE — Assessment & Plan Note (Signed)
Referral to pain management clinic she reports that they are not doing much for her.  Would like to go back to see her neurosurgeon as per pain management clinic so that that might be a good idea for her to be seen by them again to make sure there is no changes.  Might benefit from having injection into the back.  Encouraged her to continue all medications as directed at this time.

## 2020-08-15 NOTE — Assessment & Plan Note (Signed)
Currently on several medications.  I have advised her to reach out to [facility] to help get psychiatry involved denies having any SI or HI.

## 2020-08-15 NOTE — Progress Notes (Signed)
Virtual Visit via Telephone Note   This visit type was conducted due to national recommendations for restrictions regarding the COVID-19 Pandemic (e.g. social distancing) in an effort to limit this patient's exposure and mitigate transmission in our community.  Due to her co-morbid illnesses, this patient is at least at moderate risk for complications without adequate follow up.  This format is felt to be most appropriate for this patient at this time.  The patient did not have access to video technology/had technical difficulties with video requiring transitioning to audio format only (telephone).  All issues noted in this document were discussed and addressed.  No physical exam could be performed with this format.    Evaluation Performed:  Follow-up visit  Date:  08/15/2020   ID:  Debbie Santana, DOB 1991-07-17, MRN [id number]  Patient Location: Home Provider Location: Office/Clinic  Location of Patient: Home Location of Provider: Telehealth Consent was obtain for visit to be over via telehealth. I verified that I am speaking with the correct person using two identifiers.  PCP:  Perlie Mayo, NP   Chief Complaint: on going back pain  History of Present Illness:    Debbie Santana is a 29 y.o. female with history as stated below.  Reports today that she is having ongoing back pain without much relief.  She would like to have a referral back to her doctor that did her spine surgery.  She reports that her pain clinic was going to send her back there but she is not heard anything from them.  Ask if I be willing to send her to the clinic.  Dr. Sherwood Gambler was her neurosurgeon.  Referral will be made today.  Advised for her to continue all treatments that the pain clinic is offering her.  Additionally she has not reached out to [facility] quite yet.  But reports that she will be in contact with them as well.  The patient does not have symptoms concerning for COVID-19 infection (fever,  chills, cough, or new shortness of breath).   Past Medical, Surgical, Social History, Allergies, and Medications have been Reviewed.  Past Medical History:  Diagnosis Date  . Anxiety   . Bradycardia 04/17/2017  . Chest pain on breathing 02/22/2020  . Chronic back pain   . Depression   . 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 Gastric ulcer   . Genital warts   . History of gestational hypertension 05/21/2018   Dx intrapartum  . History of kidney stones   . Intertrigo 11/16/2019  . Intractable abdominal pain 04/11/2017  . Intractable vomiting 05/20/2020  . Irregular intermenstrual bleeding 02/19/2019  . Mental disorder   . Migraine without aura and without status migrainosus, not intractable 02/19/2019  . Nausea 05/10/2020  . Nausea & vomiting 12/07/2018  . Nexplanon in place 02/19/2019  . Obesity 04/17/2017  . Peptic ulcer disease 04/13/2017  . Postpartum hypertension 06/23/2018   requiring norvasc, resolved at pp visit  . Sciatica   . Vaginal Pap smear, abnormal    Past Surgical History:  Procedure Laterality Date  . BACK SURGERY     ruptured disc.  . BIOPSY  04/14/2017   Procedure: BIOPSY;  Surgeon: Rogene Houston, MD;  [facility]: [facility];  Service: Endoscopy;;  gastric  . BIOPSY  05/22/2020   Procedure: BIOPSY;  Surgeon: Daneil Dolin, MD;  Location: [facility];  Service: Endoscopy;;  . CERVICAL ABLATION N/A 12/02/2018   Procedure: LASER ABLATION OF CERVIX;  Surgeon:  Florian Buff, MD;  Location: [facility];  Service: Gynecology;  Laterality: N/A;  . CHOLECYSTECTOMY N/A 04/17/2017   Procedure: LAPAROSCOPIC CHOLECYSTECTOMY;  Surgeon: Aviva Signs, MD;  Location: [facility];  Service: General;  Laterality: N/A;  . COLPOSCOPY    . ESOPHAGOGASTRODUODENOSCOPY (EGD) WITH PROPOFOL N/A 04/14/2017   Procedure: ESOPHAGOGASTRODUODENOSCOPY (EGD) WITH PROPOFOL;  Surgeon: Rogene Houston, MD;  [facility];  Service: Endoscopy;  Laterality: N/A;  . ESOPHAGOGASTRODUODENOSCOPY (EGD) WITH  PROPOFOL N/A 05/22/2020   Procedure: ESOPHAGOGASTRODUODENOSCOPY (EGD) WITH PROPOFOL;  Surgeon: Daneil Dolin, MD;  [facility];  Service: Endoscopy;  Laterality: N/A;  . HERNIA REPAIR Bilateral    inguinal  . LASER ABLATION CONDOLAMATA N/A 12/02/2018   Procedure: LASER ABLATION CONDYLOMA ACCUMINATA LEFT AND RIGHT VULVA, PERINEUM AND PERIANAL (15 TOTAL);  Surgeon: Florian Buff, MD;  Location: [facility];  Service: Gynecology;  Laterality: N/A;     Current Meds  Medication Sig  . albuterol (VENTOLIN HFA) 108 (90 Base) MCG/ACT inhaler Inhale 1-2 puffs into the lungs every 6 (six) hours as needed for wheezing or shortness of breath.  . busPIRone (BUSPAR) 15 MG tablet Take 1 tablet (15 mg total) by mouth 3 (three) times daily.  . butalbital-acetaminophen-caffeine (FIORICET) 50-325-40 MG tablet Take 1 tablet by mouth every 6 (six) hours as needed for headache.  . etonogestrel (NEXPLANON) 68 MG IMPL implant 1 each by Subdermal route once.  . gabapentin (NEURONTIN) 300 MG capsule Take 1 capsule (300 mg total) by mouth at bedtime for 15 days, THEN 2 capsules (600 mg total) at bedtime for 15 days, THEN 3 capsules (900 mg total) at bedtime for 15 days.  . hydrOXYzine (ATARAX/VISTARIL) 25 MG tablet Take 1 tablet (25 mg total) by mouth every 6 (six) hours.  . megestrol (MEGACE) 40 MG tablet Take 2 daily  . Na Sulfate-K Sulfate-Mg Sulf (SUPREP BOWEL PREP KIT) 17.5-3.13-1.6 GM/177ML SOLN Take 1 kit by mouth as directed.  Marland Kitchen PARoxetine (PAXIL) 40 MG tablet Take 1 tablet (40 mg total) by mouth every morning.  . promethazine (PHENERGAN) 25 MG tablet Take 1 tablet (25 mg total) by mouth every 4 (four) hours as needed for nausea or vomiting.  . sucralfate (CARAFATE) 1 g tablet Take 1 tablet (1 g total) by mouth 3 (three) times daily before meals.  Marland Kitchen tiZANidine (ZANAFLEX) 4 MG tablet Take 1 tablet (4 mg total) by mouth daily as needed for muscle spasms.  . traZODone (DESYREL) 50 MG tablet TAKE 1 TABLET BY  MOUTH AT BEDTIME     Allergies:   Nicoderm [nicotine]   ROS:   Please see the history of present illness.    All other systems reviewed and are negative.   Labs/Other Tests and Data Reviewed:    Recent Labs: 05/22/2020: Magnesium 1.9 07/27/2020: ALT 14; BUN 7; Creat 0.71; Hemoglobin 15.0; Platelets 260; Potassium 4.1; Sodium 139   Recent Lipid Panel No results found for: CHOL, TRIG, HDL, CHOLHDL, LDLCALC, LDLDIRECT  Wt Readings from Last 3 Encounters:  08/15/20 235 lb (106.6 kg)  08/10/20 238 lb (108 kg)  07/27/20 235 lb 11.2 oz (106.9 kg)     Objective:    Vital Signs:  Ht 5' 8"  (1.727 m)   Wt 235 lb (106.6 kg)   LMP 08/07/2020   BMI 35.73 kg/m    VITAL SIGNS:  reviewed GEN:  no acute distress RESPIRATORY:  No shortness of breath conversation PSYCH:  Normal affect  ASSESSMENT & PLAN:  1. Chronic midline low back pain with sciatica, sciatica laterality unspecified  2. Anxiety   Time:   Today, I have spent 6 minutes with the patient with telehealth technology discussing the above problems.     Medication Adjustments/Labs and Tests Ordered: Current medicines are reviewed at length with the patient today.  Concerns regarding medicines are outlined above.   Tests Ordered: No orders of the defined types were placed in this encounter.   Medication Changes: No orders of the defined types were placed in this encounter.    Note: This dictation was prepared with Dragon dictation along with smaller phrase technology. Similar sounding words can be transcribed inadequately or may not be corrected upon review. Any transcriptional errors that result from this process are unintentional.      Disposition:  Follow up 6 months Signed, Perlie Mayo, NP  08/15/2020 9:49 AM     [facility]

## 2020-08-19 ENCOUNTER — Other Ambulatory Visit: Payer: Self-pay | Admitting: Adult Health

## 2020-08-23 ENCOUNTER — Other Ambulatory Visit: Payer: Self-pay

## 2020-08-23 ENCOUNTER — Encounter: Payer: Self-pay | Admitting: Student in an Organized Health Care Education/Training Program

## 2020-08-23 ENCOUNTER — Ambulatory Visit
Payer: Medicaid Other | Attending: Student in an Organized Health Care Education/Training Program | Admitting: Student in an Organized Health Care Education/Training Program

## 2020-08-23 VITALS — BP 113/67 | HR 77 | Temp 97.7°F | Resp 16 | Ht 68.0 in | Wt 235.0 lb

## 2020-08-23 DIAGNOSIS — M961 Postlaminectomy syndrome, not elsewhere classified: Secondary | ICD-10-CM | POA: Diagnosis not present

## 2020-08-23 DIAGNOSIS — G894 Chronic pain syndrome: Secondary | ICD-10-CM | POA: Diagnosis not present

## 2020-08-23 DIAGNOSIS — M5416 Radiculopathy, lumbar region: Secondary | ICD-10-CM | POA: Diagnosis not present

## 2020-08-23 DIAGNOSIS — G8929 Other chronic pain: Secondary | ICD-10-CM

## 2020-08-23 DIAGNOSIS — M5116 Intervertebral disc disorders with radiculopathy, lumbar region: Secondary | ICD-10-CM

## 2020-08-23 MED ORDER — ORPHENADRINE CITRATE 30 MG/ML IJ SOLN
30.0000 mg | Freq: Once | INTRAMUSCULAR | Status: AC
  Administered 2020-08-23: 30 mg via INTRAMUSCULAR
  Filled 2020-08-23: qty 2

## 2020-08-23 MED ORDER — PREDNISONE 20 MG PO TABS: ORAL_TABLET | ORAL | 0 refills | Status: AC

## 2020-08-23 MED ORDER — PREGABALIN 50 MG PO CAPS: ORAL_CAPSULE | ORAL | 0 refills | Status: DC

## 2020-08-23 NOTE — Progress Notes (Signed)
Safety precautions to be maintained throughout the outpatient stay will include: orient to surroundings, keep bed in low position, maintain call bell within reach at all times, provide assistance with transfer out of bed and ambulation.  

## 2020-08-23 NOTE — Progress Notes (Signed)
PROVIDER NOTE: Information contained herein reflects review and annotations entered in association with encounter. Interpretation of such information and data should be left to medically-trained personnel. Information provided to patient can be located elsewhere in the medical record under "Patient Instructions". Document created using STT-dictation technology, any transcriptional errors that may result from process are unintentional.    Patient: Debbie Santana  Service Category: E/M  Provider: Gillis Santa, MD  DOB: September 07, 1991  DOS: 08/23/2020  Specialty: Interventional Pain Management  MRN: [id number]  Setting: Ambulatory outpatient  PCP: Perlie Mayo, NP  Type: Established Patient    Referring Provider: Perlie Mayo, NP  Location: Office  Delivery: Face-to-face     HPI  Ms. Debbie Santana, a 29 y.o. year old female, is here today because of her Chronic radicular lumbar pain [M54.16, G89.29]. Ms. Debbie Santana primary complain today is Back Pain (lumbar midline) and Abdominal Pain (stomach ulcers) Last encounter: My last encounter with her was on 07/19/2020. Pertinent problems: Ms. 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 Pain Assessment: Severity of Chronic pain is reported as a 9 /10. Location: Back (abdominal pain from stomach ulcers) Lower, Left, Right/back pain into buttocks and into legs. Onset: More than a month ago. Quality: Discomfort, Constant, Sharp, Pressure. Timing: Constant. Modifying factor(s): nothing currently. Vitals:  height is 5' 8"  (1.727 m) and weight is 235 lb (106.6 kg). Her temporal temperature is 97.7 F (36.5 C). Her blood pressure is 113/67 and her pulse is 77. Her respiration is 16 and oxygen saturation is 98%.   Reason for  encounter: follow-up evaluation   Patient presents for her second patient visit.  At her initial visit, she was started on gabapentin and titrated up to 900 mg nightly but states that the medication is not working.  She is complaining of increased pain in her lower back that is radiating down both legs, left greater than right.  She has an upcoming appointment with neurosurgeon to discuss her MRI results and surgical intervention.  Patient is requesting a stronger pain medication.  I was very direct with her informed her that I would like to avoid opioid therapy for her case given her age and the fact that she has a disc herniation that is causing nerve compression and subsequent pain.  We discussed transitioning her to Lyrica and trying a lumbar epidural steroid injection at L3-L4 to help with her symptoms.  I will also administer a intramuscular Norflex injection today 30 mg to help out with her lower lumbar paraspinal spasms.  I will also write a prescription for prednisone taper if the patient is not seeing any benefit with Lyrica in the next 1 to 2 weeks.   ROS  Constitutional: Denies any fever or chills Gastrointestinal: No reported hemesis, hematochezia, vomiting, or acute GI distress Musculoskeletal: Low back, bilateral leg pain, left greater than right Neurological: No reported episodes of acute onset apraxia, aphasia, dysarthria, agnosia, amnesia, paralysis, loss of coordination, or loss of consciousness  Medication Review  Na Sulfate-K Sulfate-Mg Sulf, PARoxetine, SUMAtriptan, albuterol, busPIRone, butalbital-acetaminophen-caffeine, diphenhydrAMINE, etonogestrel, hydrOXYzine, megestrol, predniSONE, pregabalin, promethazine, sucralfate, tiZANidine, and traZODone  History Review  Allergy: Ms. Debbie Santana is allergic to nicoderm [nicotine] and nsaids. Drug: Ms. Debbie Santana  reports no history of drug use. Alcohol:  reports no history of alcohol use. Tobacco:  reports  that she has been smoking  cigarettes. She has a 2.50 pack-year smoking history. She has never used smokeless tobacco. Social: Ms. Debbie Santana  reports that she has been smoking cigarettes. She has a 2.50 pack-year smoking history. She has never used smokeless tobacco. She reports that she does not drink alcohol and does not use drugs. Medical:  has a past medical history of Anxiety, Bradycardia (04/17/2017), Chest pain on breathing (02/22/2020), Chronic back pain, Depression, Erosive gastritis (04/11/2017), Gastric ulcer, Genital warts, History of gestational hypertension (05/21/2018), History of kidney stones, Intertrigo (11/16/2019), Intractable abdominal pain (04/11/2017), Intractable vomiting (05/20/2020), Irregular intermenstrual bleeding (02/19/2019), Mental disorder, Migraine without aura and without status migrainosus, not intractable (02/19/2019), Nausea (05/10/2020), Nausea & vomiting (12/07/2018), Nexplanon in place (02/19/2019), Obesity (04/17/2017), Peptic ulcer disease (04/13/2017), Postpartum hypertension (06/23/2018), Sciatica, and Vaginal Pap smear, abnormal. Surgical: Ms. Debbie Santana  has a past surgical history that includes Esophagogastroduodenoscopy (egd) with propofol (N/A, 04/14/2017); biopsy (04/14/2017); Cholecystectomy (N/A, 04/17/2017); Colposcopy; Back surgery; Hernia repair (Bilateral); Cervical ablation (N/A, 12/02/2018); Laser ablation condolamata (N/A, 12/02/2018); Esophagogastroduodenoscopy (egd) with propofol (N/A, 05/22/2020); and biopsy (05/22/2020). Family: family history includes Anxiety disorder in her mother; Crohn's disease in her sister; Diabetes in her cousin and maternal grandfather; Hyperlipidemia in her mother; Learning disabilities in her cousin.  Laboratory Chemistry Profile   Renal Lab Results  Component Value Date   BUN 7 07/27/2020   CREATININE 0.71 07/27/2020   LABCREA 69.00 83/38/2505   BCR NOT APPLICABLE 39/76/7341   GFRAA 132 07/27/2020   GFRNONAA 114 07/27/2020     Hepatic Lab Results  Component Value  Date   AST 15 07/27/2020   ALT 14 07/27/2020   ALBUMIN 3.7 05/20/2020   ALKPHOS 59 05/20/2020   HCVAB <0.1 04/14/2017   LIPASE 21 05/19/2020     Electrolytes Lab Results  Component Value Date   NA 139 07/27/2020   K 4.1 07/27/2020   CL 106 07/27/2020   CALCIUM 9.7 07/27/2020   MG 1.9 05/22/2020   PHOS 2.9 04/11/2017     Bone No results found for: VD25OH, VD125OH2TOT, PF7902IO9, BD5329JM4, 25OHVITD1, 25OHVITD2, 25OHVITD3, TESTOFREE, TESTOSTERONE   Inflammation (CRP: Acute Phase) (ESR: Chronic Phase) Lab Results  Component Value Date   CRP 6.3 07/27/2020   ESRSEDRATE 2 07/27/2020   LATICACIDVEN 1.4 04/11/2017       Note: Above Lab results reviewed.  Recent Imaging Review  DG Chest Portable 1 View CLINICAL DATA:  Upper abdominal pain and vomiting.  EXAM: PORTABLE CHEST 1 VIEW  COMPARISON:  May 17, 2020  FINDINGS: There is no evidence of acute infiltrate, pleural effusion or pneumothorax. The heart size and mediastinal contours are within normal limits. The visualized skeletal structures are unremarkable.  IMPRESSION: No active disease.  Electronically Signed   By: Virgina Norfolk M.D.   On: 05/19/2020 15:14 Note: Reviewed        Physical Exam  General appearance: Well nourished, well developed, and well hydrated. In no apparent acute distress Mental status: Alert, oriented x 3 (person, place, & time)       Respiratory: No evidence of acute respiratory distress Eyes: PERLA Vitals: BP 113/67 (BP Location: Left Arm, Patient Position: Sitting, Cuff Size: Large)   Pulse 77   Temp 97.7 F (36.5 C) (Temporal)   Resp 16   Ht 5' 8"  (1.727 m)   Wt 235 lb (106.6 kg)   LMP 08/07/2020   SpO2 98%   BMI 35.73 kg/m  BMI: Estimated body mass index is 35.73 kg/m  as calculated from the following:   Height as of this encounter: 5' 8"  (1.727 m).   Weight as of this encounter: 235 lb (106.6 kg). Ideal: Ideal body weight: 63.9 kg (140 lb 14 oz) Adjusted ideal  body weight: 81 kg (178 lb 8.4 oz)  Lumbar Exam  Skin & Axial Inspection: Well healed scar from previous spine surgery detected Alignment: Symmetrical Functional ROM: Pain restricted ROM affecting both sides Stability: No instability detected Muscle Tone/Strength: Functionally intact. No obvious neuro-muscular anomalies detected. Sensory (Neurological): Dermatomal pain pattern Palpation: No palpable anomalies       Provocative Tests: Hyperextension/rotation test: (+) bilaterally for facet joint pain. Lumbar quadrant test (Kemp's test): deferred today       Lateral bending test: (+) ipsilateral radicular pain, bilaterally. Positive for bilateral foraminal stenosis.  L>R Patrick's Maneuver: deferred today                   FABER* test: deferred today                   S-I anterior distraction/compression test: deferred today         S-I lateral compression test: deferred today         S-I Thigh-thrust test: deferred today         S-I Gaenslen's test: deferred today         *(Flexion, ABduction and External Rotation)  Gait & Posture Assessment  Ambulation: Unassisted Gait: Relatively normal for age and body habitus Posture: WNL   Lower Extremity Exam    Side: Right lower extremity  Side: Left lower extremity  Stability: No instability observed          Stability: No instability observed          Skin & Extremity Inspection: Skin color, temperature, and hair growth are WNL. No peripheral edema or cyanosis. No masses, redness, swelling, asymmetry, or associated skin lesions. No contractures.  Skin & Extremity Inspection: Skin color, temperature, and hair growth are WNL. No peripheral edema or cyanosis. No masses, redness, swelling, asymmetry, or associated skin lesions. No contractures.  Functional ROM: Pain restricted ROM for hip and knee joints          Functional ROM: Pain restricted ROM for hip and knee joints          Muscle Tone/Strength: Functionally intact. No obvious  neuro-muscular anomalies detected.  Muscle Tone/Strength: Functionally intact. No obvious neuro-muscular anomalies detected.  Sensory (Neurological): Unimpaired        Sensory (Neurological): Unimpaired        DTR: Patellar: deferred today Achilles: deferred today Plantar: deferred today  DTR: Patellar: deferred today Achilles: deferred today Plantar: deferred today  Palpation: No palpable anomalies  Palpation: No palpable anomalies     Assessment   Status Diagnosis  Worsening Worsening Worsening 1. Chronic radicular lumbar pain   2. Chronic pain syndrome   3. 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 Failed back surgical syndrome   5. 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 Debbie Santana has a current medication list which includes the following long-term medication(s): albuterol, etonogestrel, suprep bowel prep kit, paroxetine, promethazine, sucralfate, trazodone, and pregabalin.  Pharmacotherapy (Medications Ordered): Meds ordered this encounter  Medications  . pregabalin (LYRICA) 50 MG capsule    Sig: Take 1 capsule (50 mg total) by mouth at bedtime for 10 days, THEN 1 capsule (50 mg total) 2 (two) times daily.    Dispense:  110 capsule    Refill:  0  . predniSONE (DELTASONE) 20 MG tablet    Sig: Take 3 tablets (60 mg total) by mouth daily with breakfast for 3 days, THEN 2 tablets (40 mg total) daily with breakfast for 3 days, THEN 1 tablet (20 mg total) daily with breakfast for 3 days.    Dispense:  18 tablet    Refill:  0  . orphenadrine (NORFLEX) injection 30 mg   Orders:  Orders Placed This Encounter  Procedures  . Lumbar Epidural Injection    Standing Status:    Future    Standing Expiration Date:   09/23/2020    Scheduling Instructions:     Procedure: Interlaminar Lumbar Epidural Steroid injection (LESI)             Laterality: Midline     Sedation: with  PO valium     Timeframe: ASAA    Order Specific Question:   Where will this procedure be performed?    Answer:   [facility]   Follow-up plan:   Return in about 2 weeks (around 09/06/2020) for Left L4/5 ESI #1 with PO Valium.   Recent Visits Date Type Provider Dept  07/19/20 Office Visit Gillis Santa, MD [facility]  Showing recent visits within past 90 days and meeting all other requirements Today's Visits Date Type Provider Dept  08/23/20 Office Visit Gillis Santa, MD [facility]  Showing today's visits and meeting all other requirements Future Appointments Date Type Provider Dept  09/11/20 Appointment Gillis Santa, MD [facility]  Showing future appointments within next 90 days and meeting all other requirements  I discussed the assessment and treatment plan with the patient. The patient was provided an opportunity to ask questions and all were answered. The patient agreed with the plan and demonstrated an understanding of the instructions.  Patient advised to call back or seek an in-person evaluation if the symptoms or condition worsens.  Duration of encounter: 30 minutes.  Note by: Gillis Santa, MD Date: 08/23/2020; Time: 2:42 PM

## 2020-08-23 NOTE — Patient Instructions (Signed)
____________________________________________________________________________________________  General Risks and Possible Complications  Patient Responsibilities: It is important that you read this as it is part of your informed consent. It is our duty to inform you of the risks and possible complications associated with treatments offered to you. It is your responsibility as a patient to read this and to ask questions about anything that is not clear or that you believe was not covered in this document.  Patient's Rights: You have the right to refuse treatment. You also have the right to change your mind, even after initially having agreed to have the treatment done. However, under this last option, if you wait until the last second to change your mind, you may be charged for the materials used up to that point.  Introduction: Medicine is not an exact science. Everything in Medicine, including the lack of treatment(s), carries the potential for danger, harm, or loss (which is by definition: Risk). In Medicine, a complication is a secondary problem, condition, or disease that can aggravate an already existing one. All treatments carry the risk of possible complications. The fact that a side effects or complications occurs, does not imply that the treatment was conducted incorrectly. It must be clearly understood that these can happen even when everything is done following the highest safety standards.  No treatment: You can choose not to proceed with the proposed treatment alternative. The "PRO(s)" would include: avoiding the risk of complications associated with the therapy. The "CON(s)" would include: not getting any of the treatment benefits. These benefits fall under one of three categories: diagnostic; therapeutic; and/or palliative. Diagnostic benefits include: getting information which can ultimately lead to improvement of the disease or symptom(s). Therapeutic benefits are those associated with the  successful treatment of the disease. Finally, palliative benefits are those related to the decrease of the primary symptoms, without necessarily curing the condition (example: decreasing the pain from a flare-up of a chronic condition, such as incurable terminal cancer).  General Risks and Complications: These are associated to most interventional treatments. They can occur alone, or in combination. They fall under one of the following six (6) categories: no benefit or worsening of symptoms; bleeding; infection; nerve damage; allergic reactions; and/or death. 1. No benefits or worsening of symptoms: In Medicine there are no guarantees, only probabilities. No healthcare provider can ever guarantee that a medical treatment will work, they can only state the probability that it may. Furthermore, there is always the possibility that the condition may worsen, either directly, or indirectly, as a consequence of the treatment. 2. Bleeding: This is more common if the patient is taking a blood thinner, either prescription or over the counter (example: Goody Powders, Fish oil, Aspirin, Garlic, etc.), or if suffering a condition associated with impaired coagulation (example: Hemophilia, cirrhosis of the liver, low platelet counts, etc.). However, even if you do not have one on these, it can still happen. If you have any of these conditions, or take one of these drugs, make sure to notify your treating physician. 3. Infection: This is more common in patients with a compromised immune system, either due to disease (example: diabetes, cancer, human immunodeficiency virus [HIV], etc.), or due to medications or treatments (example: therapies used to treat cancer and rheumatological diseases). However, even if you do not have one on these, it can still happen. If you have any of these conditions, or take one of these drugs, make sure to notify your treating physician. 4. Nerve Damage: This is more common when the   treatment is  an invasive one, but it can also happen with the use of medications, such as those used in the treatment of cancer. The damage can occur to small secondary nerves, or to large primary ones, such as those in the spinal cord and brain. This damage may be temporary or permanent and it may lead to impairments that can range from temporary numbness to permanent paralysis and/or brain death. 5. Allergic Reactions: Any time a substance or material comes in contact with our body, there is the possibility of an allergic reaction. These can range from a mild skin rash (contact dermatitis) to a severe systemic reaction (anaphylactic reaction), which can result in death. 6. Death: In general, any medical intervention can result in death, most of the time due to an unforeseen complication. ____________________________________________________________________________________________  ____________________________________________________________________________________________  Preparing for Procedure with Sedation  Procedure appointments are limited to planned procedures: . No Prescription Refills. . No disability issues will be discussed. . No medication changes will be discussed.  Instructions: . Oral Intake: Do not eat or drink anything for at least 8 hours prior to your procedure. (Exception: Blood Pressure Medication. See below.) . Transportation: Unless otherwise stated by your physician, you may drive yourself after the procedure. . Blood Pressure Medicine: Do not forget to take your blood pressure medicine with a sip of water the morning of the procedure. If your Diastolic (lower reading)is above 100 mmHg, elective cases will be cancelled/rescheduled. . Blood thinners: These will need to be stopped for procedures. Notify our staff if you are taking any blood thinners. Depending on which one you take, there will be specific instructions on how and when to stop it. . Diabetics on insulin: Notify the staff  so that you can be scheduled 1st case in the morning. If your diabetes requires high dose insulin, take only  of your normal insulin dose the morning of the procedure and notify the staff that you have done so. . Preventing infections: Shower with an antibacterial soap the morning of your procedure. . Build-up your immune system: Take 1000 mg of Vitamin C with every meal (3 times a day) the day prior to your procedure. . Antibiotics: Inform the staff if you have a condition or reason that requires you to take antibiotics before dental procedures. . Pregnancy: If you are pregnant, call and cancel the procedure. . Sickness: If you have a cold, fever, or any active infections, call and cancel the procedure. . Arrival: You must be in the facility at least 30 minutes prior to your scheduled procedure. . Children: Do not bring children with you. . Dress appropriately: Bring dark clothing that you would not mind if they get stained. . Valuables: Do not bring any jewelry or valuables.  Reasons to call and reschedule or cancel your procedure: (Following these recommendations will minimize the risk of a serious complication.) . Surgeries: Avoid having procedures within 2 weeks of any surgery. (Avoid for 2 weeks before or after any surgery). . Flu Shots: Avoid having procedures within 2 weeks of a flu shots or . (Avoid for 2 weeks before or after immunizations). . Barium: Avoid having a procedure within 7-10 days after having had a radiological study involving the use of radiological contrast. (Myelograms, Barium swallow or enema study). . Heart attacks: Avoid any elective procedures or surgeries for the initial 6 months after a "Myocardial Infarction" (Heart Attack). . Blood thinners: It is imperative that you stop these medications before procedures. Let us know if you   if you take any blood thinner.  . Infection: Avoid procedures during or within two weeks of an infection (including chest colds or  gastrointestinal problems). Symptoms associated with infections include: Localized redness, fever, chills, night sweats or profuse sweating, burning sensation when voiding, cough, congestion, stuffiness, runny nose, sore throat, diarrhea, nausea, vomiting, cold or Flu symptoms, recent or current infections. It is specially important if the infection is over the area that we intend to treat. . Heart and lung problems: Symptoms that may suggest an active cardiopulmonary problem include: cough, chest pain, breathing difficulties or shortness of breath, dizziness, ankle swelling, uncontrolled high or unusually low blood pressure, and/or palpitations. If you are experiencing any of these symptoms, cancel your procedure and contact your primary care physician for an evaluation.  Remember:  Regular Business hours are:  Monday to Thursday 8:00 AM to 4:00 PM  Provider's Schedule: Francisco Naveira, MD:  Procedure days: Tuesday and Thursday 7:30 AM to 4:00 PM  Bilal Lateef, MD:  Procedure days: Monday and Wednesday 7:30 AM to 4:00 PM ____________________________________________________________________________________________  Epidural Steroid Injection Patient Information  Description: The epidural space surrounds the nerves as they exit the spinal cord.  In some patients, the nerves can be compressed and inflamed by a bulging disc or a tight spinal canal (spinal stenosis).  By injecting steroids into the epidural space, we can bring irritated nerves into direct contact with a potentially helpful medication.  These steroids act directly on the irritated nerves and can reduce swelling and inflammation which often leads to decreased pain.  Epidural steroids may be injected anywhere along the spine and from the neck to the low back depending upon the location of your pain.   After numbing the skin with local anesthetic (like Novocaine), a small needle is passed into the epidural space slowly.  You may  experience a sensation of pressure while this is being done.  The entire block usually last less than 10 minutes.  Conditions which may be treated by epidural steroids:   Low back and leg pain  Neck and arm pain  Spinal stenosis  Post-laminectomy syndrome  Herpes zoster (shingles) pain  Pain from compression fractures  Preparation for the injection:  1. Do not eat any solid food or dairy products within 8 hours of your appointment.  2. You may drink clear liquids up to 3 hours before appointment.  Clear liquids include water, black coffee, juice or soda.  No milk or cream please. 3. You may take your regular medication, including pain medications, with a sip of water before your appointment  Diabetics should hold regular insulin (if taken separately) and take 1/2 normal NPH dos the morning of the procedure.  Carry some sugar containing items with you to your appointment. 4. A driver must accompany you and be prepared to drive you home after your procedure.  5. Bring all your current medications with your. 6. An IV may be inserted and sedation may be given at the discretion of the physician.   7. A blood pressure cuff, EKG and other monitors will often be applied during the procedure.  Some patients may need to have extra oxygen administered for a short period. 8. You will be asked to provide medical information, including your allergies, prior to the procedure.  We must know immediately if you are taking blood thinners (like Coumadin/Warfarin)  Or if you are allergic to IV iodine contrast (dye). We must know if you could possible be pregnant.  Possible side-effects:    Bleeding from needle site  Infection (rare, may require surgery)  Nerve injury (rare)  Numbness & tingling (temporary)  Difficulty urinating (rare, temporary)  Spinal headache ( a headache worse with upright posture)  Light -headedness (temporary)  Pain at injection site (several days)  Decreased blood  pressure (temporary)  Weakness in arm/leg (temporary)  Pressure sensation in back/neck (temporary)  Call if you experience:  Fever/chills associated with headache or increased back/neck pain.  Headache worsened by an upright position.  New onset weakness or numbness of an extremity below the injection site  Hives or difficulty breathing (go to the emergency room)  Inflammation or drainage at the infection site  Severe back/neck pain  Any new symptoms which are concerning to you  Please note:  Although the local anesthetic injected can often make your back or neck feel good for several hours after the injection, the pain will likely return.  It takes 3-7 days for steroids to work in the epidural space.  You may not notice any pain relief for at least that one week.  If effective, we will often do a series of three injections spaced 3-6 weeks apart to maximally decrease your pain.  After the initial series, we generally will wait several months before considering a repeat injection of the same type.  If you have any questions, please call [phone] [facility] 

## 2020-08-25 DIAGNOSIS — M5441 Lumbago with sciatica, right side: Secondary | ICD-10-CM | POA: Diagnosis not present

## 2020-08-25 DIAGNOSIS — M5442 Lumbago with sciatica, left side: Secondary | ICD-10-CM | POA: Diagnosis not present

## 2020-08-25 DIAGNOSIS — G8929 Other chronic pain: Secondary | ICD-10-CM | POA: Diagnosis not present

## 2020-08-28 ENCOUNTER — Other Ambulatory Visit (HOSPITAL_COMMUNITY)
Admission: RE | Admit: 2020-08-28 | Discharge: 2020-08-28 | Disposition: A | Discharge status: Home / Self Care | Payer: Medicaid Other | Source: Ambulatory Visit | Attending: Gastroenterology | Admitting: Gastroenterology

## 2020-08-28 ENCOUNTER — Other Ambulatory Visit: Payer: Self-pay

## 2020-08-28 DIAGNOSIS — Z01812 Encounter for preprocedural laboratory examination: Secondary | ICD-10-CM | POA: Insufficient documentation

## 2020-08-28 DIAGNOSIS — Z20822 Contact with and (suspected) exposure to covid-19: Secondary | ICD-10-CM | POA: Insufficient documentation

## 2020-08-28 LAB — SARS CORONAVIRUS 2 (TAT 6-24 HRS): SARS Coronavirus 2: NEGATIVE

## 2020-08-28 LAB — PREGNANCY, URINE: Preg Test, Ur: NEGATIVE

## 2020-08-29 ENCOUNTER — Ambulatory Visit (HOSPITAL_COMMUNITY): Payer: Medicaid Other | Admitting: Anesthesiology

## 2020-08-29 ENCOUNTER — Encounter (HOSPITAL_COMMUNITY): Payer: Self-pay | Admitting: Gastroenterology

## 2020-08-29 ENCOUNTER — Ambulatory Visit (HOSPITAL_COMMUNITY)
Admission: RE | Admit: 2020-08-29 | Discharge: 2020-08-29 | Disposition: A | Discharge status: Home / Self Care | Payer: Medicaid Other | Attending: Gastroenterology | Admitting: Gastroenterology

## 2020-08-29 ENCOUNTER — Encounter (HOSPITAL_COMMUNITY)
Admission: RE | Disposition: A | Discharge status: Home / Self Care | Payer: Self-pay | Source: Home / Self Care | Attending: Gastroenterology

## 2020-08-29 ENCOUNTER — Other Ambulatory Visit: Payer: Self-pay

## 2020-08-29 DIAGNOSIS — Z8379 Family history of other diseases of the digestive system: Secondary | ICD-10-CM | POA: Insufficient documentation

## 2020-08-29 DIAGNOSIS — Z888 Allergy status to other drugs, medicaments and biological substances status: Secondary | ICD-10-CM | POA: Diagnosis not present

## 2020-08-29 DIAGNOSIS — K529 Noninfective gastroenteritis and colitis, unspecified: Secondary | ICD-10-CM | POA: Insufficient documentation

## 2020-08-29 DIAGNOSIS — Z79899 Other long term (current) drug therapy: Secondary | ICD-10-CM | POA: Insufficient documentation

## 2020-08-29 DIAGNOSIS — D125 Benign neoplasm of sigmoid colon: Secondary | ICD-10-CM | POA: Diagnosis not present

## 2020-08-29 DIAGNOSIS — K6289 Other specified diseases of anus and rectum: Secondary | ICD-10-CM | POA: Diagnosis not present

## 2020-08-29 DIAGNOSIS — R197 Diarrhea, unspecified: Secondary | ICD-10-CM | POA: Diagnosis not present

## 2020-08-29 DIAGNOSIS — K621 Rectal polyp: Secondary | ICD-10-CM | POA: Diagnosis not present

## 2020-08-29 DIAGNOSIS — K635 Polyp of colon: Secondary | ICD-10-CM | POA: Diagnosis not present

## 2020-08-29 DIAGNOSIS — F17200 Nicotine dependence, unspecified, uncomplicated: Secondary | ICD-10-CM | POA: Insufficient documentation

## 2020-08-29 DIAGNOSIS — A630 Anogenital (venereal) warts: Secondary | ICD-10-CM | POA: Diagnosis not present

## 2020-08-29 DIAGNOSIS — Z886 Allergy status to analgesic agent status: Secondary | ICD-10-CM | POA: Diagnosis not present

## 2020-08-29 HISTORY — PX: COLONOSCOPY WITH PROPOFOL: SHX5780

## 2020-08-29 HISTORY — PX: POLYPECTOMY: SHX5525

## 2020-08-29 HISTORY — PX: BIOPSY: SHX5522

## 2020-08-29 SURGERY — COLONOSCOPY WITH PROPOFOL
Anesthesia: General

## 2020-08-29 MED ORDER — SODIUM CHLORIDE 0.9 % IV SOLN: INTRAVENOUS | Status: DC

## 2020-08-29 MED ORDER — LIDOCAINE HCL (PF) 2 % IJ SOLN
INTRAMUSCULAR | Status: AC
  Filled 2020-08-29: qty 20

## 2020-08-29 MED ORDER — PROPOFOL 10 MG/ML IV BOLUS
INTRAVENOUS | Status: AC
  Filled 2020-08-29: qty 200

## 2020-08-29 MED ORDER — PROPOFOL 10 MG/ML IV BOLUS
INTRAVENOUS | Status: DC | PRN
  Administered 2020-08-29: 100 mg via INTRAVENOUS
  Administered 2020-08-29: 175 ug/kg/min via INTRAVENOUS

## 2020-08-29 MED ORDER — LACTATED RINGERS IV SOLN: Freq: Once | INTRAVENOUS | Status: AC

## 2020-08-29 MED ORDER — PROMETHAZINE HCL 25 MG PO TABS: 25.0000 mg | ORAL_TABLET | Freq: Three times a day (TID) | ORAL | 1 refills | Status: DC | PRN

## 2020-08-29 MED ORDER — LACTATED RINGERS IV SOLN: INTRAVENOUS | Status: DC | PRN

## 2020-08-29 MED ORDER — LIDOCAINE HCL (CARDIAC) PF 100 MG/5ML IV SOSY
PREFILLED_SYRINGE | INTRAVENOUS | Status: DC | PRN
  Administered 2020-08-29: 100 mg via INTRAVENOUS

## 2020-08-29 NOTE — Discharge Instructions (Signed)
Colonoscopy, Adult, Care After This sheet gives you information about how to care for yourself after your procedure. Your doctor may also give you more specific instructions. If you have problems or questions, call your doctor. What can I expect after the procedure? After the procedure, it is common to have:  A small amount of blood in your poop (stool) for 24 hours.  Some gas.  Mild cramping or bloating in your belly (abdomen). Follow these instructions at home: Eating and drinking   Drink enough fluid to keep your pee (urine) pale yellow.  Follow instructions from your doctor about what you cannot eat or drink.  Return to your normal diet as told by your doctor. Avoid heavy or fried foods that are hard to digest. Activity  Rest as told by your doctor.  Do not sit for a long time without moving. Get up to take short walks every 1-2 hours. This is important. Ask for help if you feel weak or unsteady.  Return to your normal activities as told by your doctor. Ask your doctor what activities are safe for you. To help cramping and bloating:   Try walking around.  Put heat on your belly as told by your doctor. Use the heat source that your doctor recommends, such as a moist heat pack or a heating pad. ? Put a towel between your skin and the heat source. ? Leave the heat on for 20-30 minutes. ? Remove the heat if your skin turns bright red. This is very important if you are unable to feel pain, heat, or cold. You may have a greater risk of getting burned. General instructions  For the first 24 hours after the procedure: ? Do not drive or use machinery. ? Do not sign important documents. ? Do not drink alcohol. ? Do your daily activities more slowly than normal. ? Eat foods that are soft and easy to digest.  Take over-the-counter or prescription medicines only as told by your doctor.  Keep all follow-up visits as told by your doctor. This is important. Contact a doctor  if:  You have blood in your poop 2-3 days after the procedure. Get help right away if:  You have more than a small amount of blood in your poop.  You see large clumps of tissue (blood clots) in your poop.  Your belly is swollen.  You feel like you may vomit (nauseous).  You vomit.  You have a fever.  You have belly pain that gets worse, and medicine does not help your pain. Summary  After the procedure, it is common to have a small amount of blood in your poop. You may also have mild cramping and bloating in your belly.  For the first 24 hours after the procedure, do not drive or use machinery, do not sign important documents, and do not drink alcohol.  Get help right away if you have a lot of blood in your poop, feel like you may vomit, have a fever, or have more belly pain. This information is not intended to replace advice given to you by your health care provider. Make sure you discuss any questions you have with your health care provider. Document Revised: 04/05/2019 Document Reviewed: 04/05/2019 Elsevier Patient Education  [place]. Colon Polyps  Polyps are tissue growths inside the body. Polyps can grow in many places, including the large intestine (colon). A polyp may be a round bump or a mushroom-shaped growth. You could have one polyp or several. Most  colon polyps are noncancerous (benign). However, some colon polyps can become cancerous over time. Finding and removing the polyps early can help prevent this. What are the causes? The exact cause of colon polyps is not known. What increases the risk? You are more likely to develop this condition if you:  Have a family history of colon cancer or colon polyps.  Are older than 75 or older than 45 if you are African American.  Have inflammatory bowel disease, such as ulcerative colitis or Crohn's disease.  Have certain hereditary conditions, such as: ? Familial adenomatous polyposis. ? Lynch  syndrome. ? Turcot syndrome. ? Peutz-Jeghers syndrome.  Are overweight.  Smoke cigarettes.  Do not get enough exercise.  Drink too much alcohol.  Eat a diet that is high in fat and red meat and low in fiber.  Had childhood cancer that was treated with abdominal radiation. What are the signs or symptoms? Most polyps do not cause symptoms. If you have symptoms, they may include:  Blood coming from your rectum when having a bowel movement.  Blood in your stool. The stool may look dark red or black.  Abdominal pain.  A change in bowel habits, such as constipation or diarrhea. How is this diagnosed? This condition is diagnosed with a colonoscopy. This is a procedure in which a lighted, flexible scope is inserted into the anus and then passed into the colon to examine the area. Polyps are sometimes found when a colonoscopy is done as part of routine cancer screening tests. How is this treated? Treatment for this condition involves removing any polyps that are found. Most polyps can be removed during a colonoscopy. Those polyps will then be tested for cancer. Additional treatment may be needed depending on the results of testing. Follow these instructions at home: Lifestyle  Maintain a healthy weight, or lose weight if recommended by your health care provider.  Exercise every day or as told by your health care provider.  Do not use any products that contain nicotine or tobacco, such as cigarettes and e-cigarettes. If you need help quitting, ask your health care provider.  If you drink alcohol, limit how much you have: ? 0-1 drink a day for women. ? 0-2 drinks a day for men.  Be aware of how much alcohol is in your drink. In the [place] one drink equals one 12 oz bottle of beer (355 mL), one 5 oz glass of wine (148 mL), or one 1 oz shot of hard liquor (44 mL). Eating and drinking   Eat foods that are high in fiber, such as fruits, vegetables, and whole grains.  Eat foods that  are high in calcium and vitamin D, such as milk, cheese, yogurt, eggs, liver, fish, and broccoli.  Limit foods that are high in fat, such as fried foods and desserts.  Limit the amount of red meat and processed meat you eat, such as hot dogs, sausage, bacon, and lunch meats. General instructions  Keep all follow-up visits as told by your health care provider. This is important. ? This includes having regularly scheduled colonoscopies. ? Talk to your health care provider about when you need a colonoscopy. Contact a health care provider if:  You have new or worsening bleeding during a bowel movement.  You have new or increased blood in your stool.  You have a change in bowel habits.  You lose weight for no known reason. Summary  Polyps are tissue growths inside the body. Polyps can grow in many places,  including the colon.  Most colon polyps are noncancerous (benign), but some can become cancerous over time.  This condition is diagnosed with a colonoscopy.  Treatment for this condition involves removing any polyps that are found. Most polyps can be removed during a colonoscopy. This information is not intended to replace advice given to you by your health care provider. Make sure you discuss any questions you have with your health care provider. Document Revised: 12/25/2017 Document Reviewed: 12/25/2017 Elsevier Patient Education  2020 [place] are being discharged to home.  Resume your previous diet.  We are waiting for your pathology results.  Your physician has recommended a repeat colonoscopy (date to be determined after pending pathology results are reviewed) for screening purposes. Can start taking Imodium as needed.

## 2020-08-29 NOTE — Anesthesia Preprocedure Evaluation (Addendum)
Anesthesia Evaluation  Patient identified by MRN, date of birth, ID band Patient awake    Reviewed: Allergy & Precautions, NPO status , Patient's Chart, lab work & pertinent test results  History of Anesthesia Complications Negative for: history of anesthetic complications  Airway Mallampati: II  TM Distance: >3 FB Neck ROM: Full    Dental  (+) Dental Advisory Given, Partial Upper   Pulmonary Current Smoker and Patient abstained from smoking.,    Pulmonary exam normal breath sounds clear to auscultation       Cardiovascular hypertension, Pt. on medications Normal cardiovascular exam Rhythm:Regular Rate:Normal     Neuro/Psych  Headaches, PSYCHIATRIC DISORDERS Anxiety Depression  Neuromuscular disease    GI/Hepatic Neg liver ROS, PUD, GERD  Medicated and Controlled,  Endo/Other  negative endocrine ROS  Renal/GU negative Renal ROS     Musculoskeletal Back pain   Abdominal   Peds  Hematology negative hematology ROS (+)   Anesthesia Other Findings   Reproductive/Obstetrics                           Anesthesia Physical  Anesthesia Plan  ASA: II  Anesthesia Plan: General   Post-op Pain Management:    Induction:   PONV Risk Score and Plan: TIVA  Airway Management Planned: Nasal Cannula and Natural Airway  Additional Equipment:   Intra-op Plan:   Post-operative Plan:   Informed Consent: I have reviewed the patients History and Physical, chart, labs and discussed the procedure including the risks, benefits and alternatives for the proposed anesthesia with the patient or authorized representative who has indicated his/her understanding and acceptance.     Dental advisory given  Plan Discussed with: CRNA and Surgeon  Anesthesia Plan Comments:         Anesthesia Quick Evaluation

## 2020-08-29 NOTE — H&P (Signed)
Debbie Santana is an 29 y.o. female.   Chief Complaint: Chronic diarrhea HPI: 29 year old female with past medical history of chronic back pain, depression, gastric ulcers, anxiety, obesity, and GERD who came to the hospital for evaluation of chronic diarrhea.  Patient states that since September she has presented watery diarrhea, usually liquid in consistency without any blood in the stool or melena.  Has not had any weight loss.  She has not presented any vomiting recently but she has presented recurrent episodes of epigastric pain for multiple months.  Patient has never had a colonoscopy in the past, her most recent EGD was performed in August 2021 found to have erosive esophagitis with some gastric erosions, biopsies were negative for H. pylori.  Has not been taking any antidiarrheals.  Patient had CT testing and GI pathogen panel that were negative.  No family history of colorectal cancer but had siblings with Crohn's disease.  Does not take any blood thinners.   Past Medical History:  Diagnosis Date  . Anxiety   . Bradycardia 04/17/2017  . Chest pain on breathing 02/22/2020  . Chronic back pain   . Depression   . 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 Gastric ulcer   . Genital warts   . History of gestational hypertension 05/21/2018   Dx intrapartum  . History of kidney stones   . Intertrigo 11/16/2019  . Intractable abdominal pain 04/11/2017  . Intractable vomiting 05/20/2020  . Irregular intermenstrual bleeding 02/19/2019  . Mental disorder   . Migraine without aura and without status migrainosus, not intractable 02/19/2019  . Nausea 05/10/2020  . Nausea & vomiting 12/07/2018  . Nexplanon in place 02/19/2019  . Obesity 04/17/2017  . Peptic ulcer disease 04/13/2017  . Postpartum hypertension 06/23/2018   requiring norvasc, resolved at pp visit  . Sciatica   . Vaginal Pap smear, abnormal     Past Surgical History:  Procedure Laterality Date  . BACK SURGERY     ruptured disc.  . BIOPSY   04/14/2017   Procedure: BIOPSY;  Surgeon: Rogene Houston, MD;  Location: [facility];  Service: Endoscopy;;  gastric  . BIOPSY  05/22/2020   Procedure: BIOPSY;  Surgeon: Daneil Dolin, MD;  Location: [facility];  Service: Endoscopy;;  . CERVICAL ABLATION N/A 12/02/2018   Procedure: LASER ABLATION OF CERVIX;  Surgeon: Florian Buff, MD;  Location: [facility];  Service: Gynecology;  Laterality: N/A;  . CHOLECYSTECTOMY N/A 04/17/2017   Procedure: LAPAROSCOPIC CHOLECYSTECTOMY;  Surgeon: Aviva Signs, MD;  Location: [facility];  Service: General;  Laterality: N/A;  . COLPOSCOPY    . ESOPHAGOGASTRODUODENOSCOPY (EGD) WITH PROPOFOL N/A 04/14/2017   Procedure: ESOPHAGOGASTRODUODENOSCOPY (EGD) WITH PROPOFOL;  Surgeon: Rogene Houston, MD;  [facility]: [facility];  Service: Endoscopy;  Laterality: N/A;  . ESOPHAGOGASTRODUODENOSCOPY (EGD) WITH PROPOFOL N/A 05/22/2020   Procedure: ESOPHAGOGASTRODUODENOSCOPY (EGD) WITH PROPOFOL;  Surgeon: Daneil Dolin, MD;  [facility]: [facility];  Service: Endoscopy;  Laterality: N/A;  . HERNIA REPAIR Bilateral    inguinal  . LASER ABLATION CONDOLAMATA N/A 12/02/2018   Procedure: LASER ABLATION CONDYLOMA ACCUMINATA LEFT AND RIGHT VULVA, PERINEUM AND PERIANAL (15 TOTAL);  Surgeon: Florian Buff, MD;  Location: [facility];  Service: Gynecology;  Laterality: N/A;    Family History  Problem Relation Age of Onset  . Anxiety disorder Mother   . Hyperlipidemia Mother   . Crohn's disease Sister   . Diabetes Maternal Grandfather   . Diabetes Cousin   .  Learning disabilities Cousin    Social History:  reports that she has been smoking cigarettes. She has a 2.50 pack-year smoking history. She has never used smokeless tobacco. She reports that she does not drink alcohol and does not use drugs.  Allergies:  Allergies  Allergen Reactions  . Nicoderm [Nicotine] Itching    Sharp pain at site  . Nsaids Other (See Comments)    History of ulcers    Medications Prior to  Admission  Medication Sig Dispense Refill  . albuterol (VENTOLIN HFA) 108 (90 Base) MCG/ACT inhaler Inhale 1-2 puffs into the lungs every 6 (six) hours as needed for wheezing or shortness of breath. 8 g 1  . busPIRone (BUSPAR) 15 MG tablet Take 1 tablet (15 mg total) by mouth 3 (three) times daily. 90 tablet 6  . butalbital-acetaminophen-caffeine (FIORICET) 50-325-40 MG tablet TAKE 1 TABLET BY MOUTH EVERY 6 HOURS AS NEEDED FOR HEADACHE (Patient taking differently: Take 1 tablet by mouth every 6 (six) hours as needed (migraine headaches). ) 20 tablet 0  . diphenhydrAMINE (BENADRYL) 25 MG tablet Take 25 mg by mouth every 6 (six) hours as needed (allergies.).    Marland Kitchen hydrOXYzine (ATARAX/VISTARIL) 25 MG tablet Take 1 tablet (25 mg total) by mouth every 6 (six) hours. 30 tablet 3  . megestrol (MEGACE) 40 MG tablet Take 2 daily (Patient taking differently: Take 80 mg by mouth daily. Take 2 daily) 60 tablet 3  . Na Sulfate-K Sulfate-Mg Sulf (SUPREP BOWEL PREP KIT) 17.5-3.13-1.6 GM/177ML SOLN Take 1 kit by mouth as directed. 177 mL 0  . PARoxetine (PAXIL) 40 MG tablet Take 1 tablet (40 mg total) by mouth every morning. 30 tablet 6  . predniSONE (DELTASONE) 20 MG tablet Take 3 tablets (60 mg total) by mouth daily with breakfast for 3 days, THEN 2 tablets (40 mg total) daily with breakfast for 3 days, THEN 1 tablet (20 mg total) daily with breakfast for 3 days. 18 tablet 0  . pregabalin (LYRICA) 50 MG capsule Take 1 capsule (50 mg total) by mouth at bedtime for 10 days, THEN 1 capsule (50 mg total) 2 (two) times daily. 110 capsule 0  . promethazine (PHENERGAN) 25 MG tablet Take 1 tablet (25 mg total) by mouth every 4 (four) hours as needed for nausea or vomiting. 30 tablet 1  . sucralfate (CARAFATE) 1 g tablet Take 1 tablet (1 g total) by mouth 3 (three) times daily before meals. 90 tablet 3  . SUMAtriptan (IMITREX) 25 MG tablet Take 25 mg by mouth every 2 (two) hours as needed for migraine. May repeat in 2 hours  if headache persists or recurs.    . traZODone (DESYREL) 50 MG tablet TAKE 1 TABLET BY MOUTH AT BEDTIME (Patient taking differently: Take 50 mg by mouth at bedtime. ) 30 tablet 0  . etonogestrel (NEXPLANON) 68 MG IMPL implant 1 each by Subdermal route once.    Marland Kitchen tiZANidine (ZANAFLEX) 4 MG tablet Take 1 tablet (4 mg total) by mouth daily as needed for muscle spasms. 30 tablet 1    Results for orders placed or performed during the hospital encounter of 08/28/20 (from the past 48 hour(s))  Pregnancy, urine     Status: None   Collection Time: 08/28/20  9:04 AM  Result Value Ref Range   Preg Test, Ur NEGATIVE NEGATIVE    Comment:        THE SENSITIVITY OF THIS METHODOLOGY IS >20 mIU/mL. Performed at [facility], [address], [place],  [place] [address]   SARS CORONAVIRUS 2 (TAT 6-24 HRS) Nasopharyngeal Nasopharyngeal Swab     Status: None   Collection Time: 08/28/20  9:07 AM   Specimen: Nasopharyngeal Swab  Result Value Ref Range   SARS Coronavirus 2 NEGATIVE NEGATIVE    Comment: (NOTE) SARS-CoV-2 target nucleic acids are NOT DETECTED.  The SARS-CoV-2 RNA is generally detectable in upper and lower respiratory specimens during the acute phase of infection. Negative results do not preclude SARS-CoV-2 infection, do not rule out co-infections with other pathogens, and should not be used as the sole basis for treatment or other patient management decisions. Negative results must be combined with clinical observations, patient history, and epidemiological information. The expected result is Negative.  Fact Sheet for Patients: SugarRoll.be  Fact Sheet for Healthcare Providers: https://www.woods-mathews.com/  This test is not yet approved or cleared by the [place] FDA and  has been authorized for detection and/or diagnosis of SARS-CoV-2 by FDA under an Emergency Use Authorization (EUA). This EUA will remain  in effect (meaning this test can  be used) for the duration of the COVID-19 declaration under Se ction 564(b)(1) of the Act, 21 U.S.C. section 360bbb-3(b)(1), unless the authorization is terminated or revoked sooner.  Performed at [facility], [place] [address], [place], [place] [address]    No results found.  Review of Systems  Constitutional: Negative.   HENT: Negative.   Eyes: Negative.   Respiratory: Negative.   Cardiovascular: Negative.   Gastrointestinal: Positive for abdominal pain and diarrhea.  Endocrine: Negative.   Genitourinary: Negative.   Musculoskeletal: Negative.   Skin: Negative.   Allergic/Immunologic: Negative.   Neurological: Negative.   Hematological: Negative.   Psychiatric/Behavioral: Negative.     Blood pressure 122/71, pulse (!) 56, temperature 98.2 F (36.8 C), temperature source Oral, resp. rate 10, last menstrual period 08/07/2020, SpO2 94 %. Physical Exam  GENERAL: The patient is AO x3, in no acute distress. HEENT: Head is normocephalic and atraumatic. EOMI are intact. Mouth is well hydrated and without lesions. NECK: Supple. No masses LUNGS: Clear to auscultation. No presence of rhonchi/wheezing/rales. Adequate chest expansion HEART: RRR, normal s1 and s2. ABDOMEN: Soft, nontender, no guarding, no peritoneal signs, and nondistended. BS +. No masses. EXTREMITIES: Without any cyanosis, clubbing, rash, lesions or edema. NEUROLOGIC: AOx3, no focal motor deficit. SKIN: no jaundice, no rashes  Assessment/Plan 29 year old female with past medical history of chronic back pain, depression, gastric ulcers, anxiety, obesity, and GERD who came to the hospital for evaluation of chronic diarrhea. Will proceed with colonoscopy with random colon bx.  Harvel Quale, MD 08/29/2020, 7:28 AM

## 2020-08-29 NOTE — Anesthesia Postprocedure Evaluation (Signed)
Anesthesia Post Note  Patient: Debbie Santana  Procedure(s) Performed: COLONOSCOPY WITH PROPOFOL (N/A ) BIOPSY POLYPECTOMY  Patient location during evaluation: Endoscopy Anesthesia Type: General Level of consciousness: awake, oriented, awake and alert and patient cooperative Pain management: pain level controlled Vital Signs Assessment: post-procedure vital signs reviewed and stable Respiratory status: spontaneous breathing, respiratory function stable and nonlabored ventilation Cardiovascular status: blood pressure returned to baseline and stable Postop Assessment: no headache and no backache Anesthetic complications: no   No complications documented.   Last Vitals:  Vitals:   08/29/20 0654  BP: 122/71  Pulse: (!) 56  Resp: 10  Temp: 36.8 C  SpO2: 94%    Last Pain:  Vitals:   08/29/20 0654  TempSrc: Oral  PainSc: 8                  Tacy Learn

## 2020-08-29 NOTE — Transfer of Care (Signed)
Immediate Anesthesia Transfer of Care Note  Patient: Debbie Santana  Procedure(s) Performed: COLONOSCOPY WITH PROPOFOL (N/A ) BIOPSY POLYPECTOMY  Patient Location: Endoscopy Unit  Anesthesia Type:General  Level of Consciousness: awake, alert , oriented and patient cooperative  Airway & Oxygen Therapy: Patient Spontanous Breathing  Post-op Assessment: Report given to RN, Post -op Vital signs reviewed and stable and Patient moving all extremities  Post vital signs: Reviewed and stable  Last Vitals:  Vitals Value Taken Time  BP    Temp    Pulse    Resp    SpO2      Last Pain:  Vitals:   08/29/20 0654  TempSrc: Oral  PainSc: 8       Patients Stated Pain Goal: 6 (31/59/45 8592)  Complications: No complications documented.

## 2020-08-29 NOTE — Op Note (Signed)
[facility] Patient Name: Debbie Santana Procedure Date: 08/29/2020 7:27 AM MRN: [id number] Date of Birth: June 16, 1991 Attending MD: Maylon Peppers ,  CSN: [id number] Age: 29 Admit Type: Outpatient Procedure:                Colonoscopy Indications:              Chronic diarrhea Providers:                Maylon Peppers, Janeece Riggers, RN, Nelma Rothman,                            Technician Referring MD:              Medicines:                Monitored Anesthesia Care Complications:            No immediate complications. Estimated Blood Loss:     Estimated blood loss: none. Procedure:                Pre-Anesthesia Assessment:                           - Prior to the procedure, a History and Physical                            was performed, and patient medications, allergies                            and sensitivities were reviewed. The patient's                            tolerance of previous anesthesia was reviewed.                           - The risks and benefits of the procedure and the                            sedation options and risks were discussed with the                            patient. All questions were answered and informed                            consent was obtained.                           - ASA Grade Assessment: I - A normal, healthy                            patient.                           After obtaining informed consent, the colonoscope                            was passed under direct vision. Throughout the  procedure, the patient's blood pressure, pulse, and                            oxygen saturations were monitored continuously. The                            [id number]-[id number] ([id number]) scope was introduced through                            the anus and advanced to the the terminal ileum.                            The colonoscopy was performed without difficulty.                            The patient tolerated the  procedure well. The                            quality of the bowel preparation was excellent.                            Scope withdrawal time was 12 minutes. Scope In: 7:35:34 AM Scope Out: 8:04:10 AM Scope Withdrawal Time: 0 hours 25 minutes 1 second  Total Procedure Duration: 0 hours 28 minutes 36 seconds  Findings:      The perianal and digital rectal examinations were normal.      The terminal ileum appeared normal up to 15 cm from IC valve.      The colon (entire examined portion) did not show any signs of       inflammation. Biopsies for histology were taken with a cold forceps from       the right colon and left colon for evaluation of microscopic colitis.      A 2 mm polyp was found in the sigmoid colon. The polyp was sessile. The       polyp was removed with a cold biopsy forceps. Resection and retrieval       were complete.      A 8 mm polyp was found in the rectum. The polyp was sessile. The polyp       was removed with a hot snare, as removal with cold snare could not be       achieved since the base could not be transected without cautery.       Resection and retrieval were complete.      Anal papilla(e) were hypertrophied, seen in retroflexion. Impression:               - The examined portion of the ileum was normal.                           - The entire examined colon did not show                            inflammation. Biopsied.                           - One 2 mm polyp in the sigmoid colon, removed  with                            a cold biopsy forceps. Resected and retrieved.                           - One 8 mm polyp in the rectum, removed with a hot                            snare. Resected and retrieved.                           - Anal papilla(e) were hypertrophied. Moderate Sedation:      Per Anesthesia Care Recommendation:           - Discharge patient to home (ambulatory).                           - Resume previous diet.                           -  Await pathology results.                           - Repeat colonoscopy date to be determined after                            pending pathology results are reviewed for                            screening purposes.                           - Can start taking Imodium as needed. Procedure Code(s):        --- Professional ---                           [phone], GC, Colonoscopy, flexible; with removal of                            tumor(s), polyp(s), or other lesion(s) by snare                            technique                           [id number], 73, Colonoscopy, flexible; with biopsy,                            single or multiple Diagnosis Code(s):        --- Professional ---                           K63.5, Polyp of colon                           K62.1, Rectal polyp  K62.89, Other specified diseases of anus and rectum                           K52.9, Noninfective gastroenteritis and colitis,                            unspecified CPT copyright 2019 American Medical Association. All rights reserved. The codes documented in this report are preliminary and upon coder review may  be revised to meet current compliance requirements. Maylon Peppers, MD Maylon Peppers,  08/29/2020 8:17:12 AM This report has been signed electronically. Number of Addenda: 0

## 2020-08-31 LAB — SURGICAL PATHOLOGY

## 2020-09-04 ENCOUNTER — Encounter (HOSPITAL_COMMUNITY): Payer: Self-pay | Admitting: Gastroenterology

## 2020-09-04 ENCOUNTER — Other Ambulatory Visit: Payer: Self-pay | Admitting: Adult Health

## 2020-09-08 ENCOUNTER — Other Ambulatory Visit: Payer: Self-pay

## 2020-09-08 ENCOUNTER — Other Ambulatory Visit: Payer: Medicaid Other | Admitting: Adult Health

## 2020-09-08 ENCOUNTER — Encounter (HOSPITAL_COMMUNITY): Payer: Self-pay

## 2020-09-08 ENCOUNTER — Inpatient Hospital Stay (HOSPITAL_COMMUNITY)
Admission: EM | Admit: 2020-09-08 | Discharge: 2020-09-11 | DRG: 392 | Disposition: A | Discharge status: Home / Self Care | Payer: Medicaid Other | Attending: Internal Medicine | Admitting: Internal Medicine

## 2020-09-08 ENCOUNTER — Emergency Department (HOSPITAL_COMMUNITY): Payer: Medicaid Other

## 2020-09-08 DIAGNOSIS — D72829 Elevated white blood cell count, unspecified: Secondary | ICD-10-CM

## 2020-09-08 DIAGNOSIS — D72823 Leukemoid reaction: Secondary | ICD-10-CM | POA: Diagnosis present

## 2020-09-08 DIAGNOSIS — Z6834 Body mass index (BMI) 34.0-34.9, adult: Secondary | ICD-10-CM | POA: Diagnosis not present

## 2020-09-08 DIAGNOSIS — B9710 Unspecified enterovirus as the cause of diseases classified elsewhere: Secondary | ICD-10-CM | POA: Diagnosis present

## 2020-09-08 DIAGNOSIS — F1211 Cannabis abuse, in remission: Secondary | ICD-10-CM | POA: Diagnosis not present

## 2020-09-08 DIAGNOSIS — B9789 Other viral agents as the cause of diseases classified elsewhere: Secondary | ICD-10-CM | POA: Diagnosis present

## 2020-09-08 DIAGNOSIS — R739 Hyperglycemia, unspecified: Secondary | ICD-10-CM | POA: Diagnosis present

## 2020-09-08 DIAGNOSIS — Z8711 Personal history of peptic ulcer disease: Secondary | ICD-10-CM | POA: Diagnosis not present

## 2020-09-08 DIAGNOSIS — K2960 Other gastritis without bleeding: Secondary | ICD-10-CM | POA: Diagnosis present

## 2020-09-08 DIAGNOSIS — Z20822 Contact with and (suspected) exposure to covid-19: Secondary | ICD-10-CM | POA: Diagnosis present

## 2020-09-08 DIAGNOSIS — G8929 Other chronic pain: Secondary | ICD-10-CM | POA: Diagnosis not present

## 2020-09-08 DIAGNOSIS — E876 Hypokalemia: Secondary | ICD-10-CM | POA: Diagnosis present

## 2020-09-08 DIAGNOSIS — Z79899 Other long term (current) drug therapy: Secondary | ICD-10-CM

## 2020-09-08 DIAGNOSIS — F17210 Nicotine dependence, cigarettes, uncomplicated: Secondary | ICD-10-CM | POA: Diagnosis present

## 2020-09-08 DIAGNOSIS — G43909 Migraine, unspecified, not intractable, without status migrainosus: Secondary | ICD-10-CM | POA: Diagnosis present

## 2020-09-08 DIAGNOSIS — K2100 Gastro-esophageal reflux disease with esophagitis, without bleeding: Secondary | ICD-10-CM | POA: Diagnosis present

## 2020-09-08 DIAGNOSIS — F32A Depression, unspecified: Secondary | ICD-10-CM | POA: Diagnosis present

## 2020-09-08 DIAGNOSIS — R1013 Epigastric pain: Secondary | ICD-10-CM | POA: Diagnosis not present

## 2020-09-08 DIAGNOSIS — R109 Unspecified abdominal pain: Secondary | ICD-10-CM | POA: Diagnosis present

## 2020-09-08 DIAGNOSIS — R110 Nausea: Secondary | ICD-10-CM | POA: Diagnosis not present

## 2020-09-08 DIAGNOSIS — R112 Nausea with vomiting, unspecified: Secondary | ICD-10-CM

## 2020-09-08 DIAGNOSIS — K529 Noninfective gastroenteritis and colitis, unspecified: Secondary | ICD-10-CM | POA: Diagnosis present

## 2020-09-08 DIAGNOSIS — Z720 Tobacco use: Secondary | ICD-10-CM | POA: Diagnosis not present

## 2020-09-08 DIAGNOSIS — G894 Chronic pain syndrome: Secondary | ICD-10-CM | POA: Diagnosis present

## 2020-09-08 DIAGNOSIS — F419 Anxiety disorder, unspecified: Secondary | ICD-10-CM | POA: Diagnosis present

## 2020-09-08 DIAGNOSIS — Z87442 Personal history of urinary calculi: Secondary | ICD-10-CM

## 2020-09-08 DIAGNOSIS — F1290 Cannabis use, unspecified, uncomplicated: Secondary | ICD-10-CM | POA: Diagnosis present

## 2020-09-08 DIAGNOSIS — R0902 Hypoxemia: Secondary | ICD-10-CM | POA: Diagnosis not present

## 2020-09-08 DIAGNOSIS — F12188 Cannabis abuse with other cannabis-induced disorder: Secondary | ICD-10-CM

## 2020-09-08 DIAGNOSIS — J209 Acute bronchitis, unspecified: Secondary | ICD-10-CM | POA: Diagnosis present

## 2020-09-08 DIAGNOSIS — R1084 Generalized abdominal pain: Secondary | ICD-10-CM | POA: Diagnosis not present

## 2020-09-08 DIAGNOSIS — Z888 Allergy status to other drugs, medicaments and biological substances status: Secondary | ICD-10-CM | POA: Diagnosis not present

## 2020-09-08 DIAGNOSIS — E860 Dehydration: Secondary | ICD-10-CM | POA: Diagnosis present

## 2020-09-08 DIAGNOSIS — E669 Obesity, unspecified: Secondary | ICD-10-CM | POA: Diagnosis not present

## 2020-09-08 DIAGNOSIS — K2970 Gastritis, unspecified, without bleeding: Secondary | ICD-10-CM | POA: Diagnosis not present

## 2020-09-08 LAB — COMPREHENSIVE METABOLIC PANEL
ALT: 16 U/L (ref 0–44)
AST: 17 U/L (ref 15–41)
Albumin: 3.8 g/dL (ref 3.5–5.0)
Alkaline Phosphatase: 67 U/L (ref 38–126)
Anion gap: 11 (ref 5–15)
BUN: 6 mg/dL (ref 6–20)
CO2: 21 mmol/L — ABNORMAL LOW (ref 22–32)
Calcium: 9.3 mg/dL (ref 8.9–10.3)
Chloride: 106 mmol/L (ref 98–111)
Creatinine, Ser: 0.74 mg/dL (ref 0.44–1.00)
GFR, Estimated: >60 mL/min (ref >60)
Glucose, Bld: 149 mg/dL — ABNORMAL HIGH (ref 70–99)
Potassium: 3.7 mmol/L (ref 3.5–5.1)
Sodium: 138 mmol/L (ref 135–145)
Total Bilirubin: 0.7 mg/dL (ref 0.3–1.2)
Total Protein: 6.9 g/dL (ref 6.5–8.1)

## 2020-09-08 LAB — CBC
HCT: 43 % (ref 36.0–46.0)
Hemoglobin: 14.8 g/dL (ref 12.0–15.0)
MCH: 31.9 pg (ref 26.0–34.0)
MCHC: 34.4 g/dL (ref 30.0–36.0)
MCV: 92.7 fL (ref 80.0–100.0)
Platelets: 247 10*3/uL (ref 150–400)
RBC: 4.64 MIL/uL (ref 3.87–5.11)
RDW: 12.6 % (ref 11.5–15.5)
WBC: 17.2 10*3/uL — ABNORMAL HIGH (ref 4.0–10.5)
nRBC: 0 % (ref 0.0–0.2)

## 2020-09-08 LAB — LIPASE, BLOOD: Lipase: 24 U/L (ref 11–51)

## 2020-09-08 LAB — CBG MONITORING, ED: Glucose-Capillary: 171 mg/dL — ABNORMAL HIGH (ref 70–99)

## 2020-09-08 LAB — URINALYSIS, ROUTINE W REFLEX MICROSCOPIC
Bilirubin Urine: NEGATIVE
Glucose, UA: NEGATIVE mg/dL
Hgb urine dipstick: NEGATIVE
Ketones, ur: 20 mg/dL — AB
Leukocytes,Ua: NEGATIVE
Nitrite: NEGATIVE
Protein, ur: NEGATIVE mg/dL
Specific Gravity, Urine: 1.014 (ref 1.005–1.030)
pH: 9 — ABNORMAL HIGH (ref 5.0–8.0)

## 2020-09-08 LAB — HCG, QUANTITATIVE, PREGNANCY: hCG, Beta Chain, Quant, S: <1 m[IU]/mL (ref <5)

## 2020-09-08 MED ORDER — ONDANSETRON HCL 4 MG/2ML IJ SOLN
4.0000 mg | Freq: Once | INTRAMUSCULAR | Status: AC
  Administered 2020-09-08: 20:00:00 4 mg via INTRAVENOUS
  Filled 2020-09-08: qty 2

## 2020-09-08 MED ORDER — PROCHLORPERAZINE EDISYLATE 10 MG/2ML IJ SOLN
10.0000 mg | Freq: Once | INTRAMUSCULAR | Status: AC
  Administered 2020-09-08: 23:00:00 10 mg via INTRAVENOUS
  Filled 2020-09-08: qty 2

## 2020-09-08 MED ORDER — PROMETHAZINE HCL 25 MG/ML IJ SOLN
12.5000 mg | Freq: Once | INTRAMUSCULAR | Status: AC
  Administered 2020-09-08: 17:00:00 12.5 mg via INTRAVENOUS
  Filled 2020-09-08: qty 1

## 2020-09-08 MED ORDER — FAMOTIDINE IN NACL 20-0.9 MG/50ML-% IV SOLN
20.0000 mg | Freq: Once | INTRAVENOUS | Status: AC
  Administered 2020-09-08: 23:00:00 20 mg via INTRAVENOUS
  Filled 2020-09-08: qty 50

## 2020-09-08 MED ORDER — ONDANSETRON HCL 4 MG/2ML IJ SOLN
4.0000 mg | Freq: Once | INTRAMUSCULAR | Status: AC
  Administered 2020-09-08: 19:00:00 4 mg via INTRAVENOUS
  Filled 2020-09-08: qty 2

## 2020-09-08 MED ORDER — PANTOPRAZOLE SODIUM 20 MG PO TBEC: 20.0000 mg | DELAYED_RELEASE_TABLET | Freq: Every day | ORAL | 0 refills | Status: DC

## 2020-09-08 MED ORDER — DIPHENHYDRAMINE HCL 50 MG/ML IJ SOLN
12.5000 mg | Freq: Once | INTRAMUSCULAR | Status: AC
  Administered 2020-09-08: 17:00:00 12.5 mg via INTRAVENOUS
  Filled 2020-09-08: qty 1

## 2020-09-08 MED ORDER — LORAZEPAM 2 MG/ML IJ SOLN
1.0000 mg | Freq: Once | INTRAMUSCULAR | Status: AC
  Administered 2020-09-08: 18:00:00 1 mg via INTRAVENOUS
  Filled 2020-09-08: qty 1

## 2020-09-08 MED ORDER — MORPHINE SULFATE (PF) 4 MG/ML IV SOLN
4.0000 mg | Freq: Once | INTRAVENOUS | Status: AC
  Administered 2020-09-08: 15:00:00 4 mg via INTRAVENOUS
  Filled 2020-09-08: qty 1

## 2020-09-08 MED ORDER — MORPHINE SULFATE (PF) 4 MG/ML IV SOLN
4.0000 mg | Freq: Once | INTRAVENOUS | Status: AC
Start: 2020-09-08 — End: 2020-09-08
  Administered 2020-09-08: 23:00:00 4 mg via INTRAVENOUS
  Filled 2020-09-08: qty 1

## 2020-09-08 MED ORDER — PROMETHAZINE HCL 25 MG PO TABS: 25.0000 mg | ORAL_TABLET | Freq: Four times a day (QID) | ORAL | 0 refills | Status: DC | PRN

## 2020-09-08 MED ORDER — ONDANSETRON HCL 4 MG/2ML IJ SOLN
INTRAMUSCULAR | Status: AC
  Administered 2020-09-08: 14:00:00 4 mg
  Filled 2020-09-08: qty 2

## 2020-09-08 MED ORDER — PROMETHAZINE HCL 25 MG/ML IJ SOLN
12.5000 mg | Freq: Once | INTRAMUSCULAR | Status: AC
  Administered 2020-09-08: 15:00:00 12.5 mg via INTRAVENOUS
  Filled 2020-09-08: qty 1

## 2020-09-08 MED ORDER — ONDANSETRON HCL 4 MG/2ML IJ SOLN: 4.0000 mg | Freq: Once | INTRAMUSCULAR | Status: AC

## 2020-09-08 MED ORDER — HYDROMORPHONE HCL 1 MG/ML IJ SOLN
1.0000 mg | Freq: Once | INTRAMUSCULAR | Status: AC
  Administered 2020-09-08: 17:00:00 1 mg via INTRAVENOUS
  Filled 2020-09-08: qty 1

## 2020-09-08 MED ORDER — CAPSAICIN 0.025 % EX CREA
TOPICAL_CREAM | Freq: Two times a day (BID) | CUTANEOUS | Status: DC
  Filled 2020-09-08 (×2): qty 60

## 2020-09-08 MED ORDER — IOHEXOL 300 MG/ML  SOLN
100.0000 mL | Freq: Once | INTRAMUSCULAR | Status: AC | PRN
  Administered 2020-09-08: 21:00:00 100 mL via INTRAVENOUS

## 2020-09-08 NOTE — ED Triage Notes (Signed)
Pt brought in EMS due to abdominal pain and vomiting since this morning. Pt reported diarrhea for a few days and colonoscopy last week which was normal. Pt received 4 mg of zofran and 500 cc fluid prior to arrival

## 2020-09-08 NOTE — ED Provider Notes (Addendum)
[facility] EMERGENCY DEPARTMENT Provider Note   CSN: [id number] Arrival date & time: 09/08/20  1306     History Chief Complaint  Patient presents with  . Abdominal Pain    Debbie Santana is a 29 y.o. female with a history as outlined below, most significant for erosive gastritis, chronic back pain under the care of chronic pain management, history of kidney stones, also history of chronic abdominal pain, nausea and vomiting who underwent a colonoscopy last week under the care of Dr. Jenetta Downer to screen her for Crohn's disease which runs in her family, patient states her testing was negative for this condition.  She also has a history of chronic marijuana use and there was concern raised at her previous admission regarding marijuana hyperemesis syndrome.  She is maintained on PPI therapy, is not currently on any medications for nausea.  She endorses a 24-hour history of worsening generalized sharp abdominal and back pain and inability to tolerate any p.o. intake.  She has had no fevers or chills.  Her emesis has been nonbloody.  She also reports multiple episodes of diarrhea over the past several days.  She arrived via EMS and received 500 cc of fluid prior to arrival in addition to an IV dose of Zofran.  She continues to dry heave here.  She states when she has these episodes she needs pain medication and something for her nerves.  She has found no alleviators for her symptoms.     HPI     Past Medical History:  Diagnosis Date  . Anxiety   . Bradycardia 04/17/2017  . Chest pain on breathing 02/22/2020  . Chronic back pain   . Depression   . 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 Gastric ulcer   . Genital warts   . History of gestational hypertension 05/21/2018   Dx intrapartum  . History of kidney stones   . Intertrigo 11/16/2019  . Intractable abdominal pain 04/11/2017  . Intractable vomiting 05/20/2020  . Irregular intermenstrual bleeding 02/19/2019  . Mental disorder   . Migraine without  aura and without status migrainosus, not intractable 02/19/2019  . Nausea 05/10/2020  . Nausea & vomiting 12/07/2018  . Nexplanon in place 02/19/2019  . Obesity 04/17/2017  . Peptic ulcer disease 04/13/2017  . Postpartum hypertension 06/23/2018   requiring norvasc, resolved at pp visit  . Sciatica   . Vaginal Pap smear, abnormal     Patient Active Problem List   Diagnosis Date Noted  . Chronic radicular lumbar pain 08/23/2020  . Chronic pain syndrome 08/23/2020  . 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 Post laminectomy syndrome 08/23/2020  . Frequent headaches 08/10/2020  . Nexplanon in place 08/10/2020  . Irregular intermenstrual bleeding 08/10/2020  . Obesity (BMI 35.0-39.9 without comorbidity) 05/20/2020  . 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 Hypokalemia 05/19/2020  . Panic attack 05/18/2020  . Epigastric pain 05/18/2020  . Agitated 05/18/2020  . Gastroesophageal reflux disease 05/10/2020  . Alternating constipation and diarrhea 05/10/2020  . Wheezing on inspiration 02/22/2020  . Sleep disturbance 02/22/2020  . Anxiety and depression 02/19/2019  . Low back pain 12/07/2018  . Condyloma 04/15/2018  . History of abnormal cervical Pap smear 11/04/2017  . Cigarette smoker 04/26/2017  . Reflux esophagitis 04/17/2017  . Tobacco abuse 04/17/2017  . Peptic ulcer disease 04/13/2017  . 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 Anxiety 04/11/2017    Past Surgical History:  Procedure Laterality Date  . BACK SURGERY     ruptured  disc.  Marland Kitchen BIOPSY  04/14/2017   Procedure: BIOPSY;  Surgeon: Rogene Houston, MD;  Location: [facility];  Service: Endoscopy;;  gastric  . BIOPSY  05/22/2020   Procedure: BIOPSY;  Surgeon: Daneil Dolin, MD;  Location: [facility];  Service: Endoscopy;;  . BIOPSY  08/29/2020   Procedure: BIOPSY;  Surgeon: Harvel Quale, MD;  Location: [facility];  Service: Gastroenterology;;  . CERVICAL ABLATION N/A 12/02/2018    Procedure: LASER ABLATION OF CERVIX;  Surgeon: Florian Buff, MD;  Location: [facility];  Service: Gynecology;  Laterality: N/A;  . CHOLECYSTECTOMY N/A 04/17/2017   Procedure: LAPAROSCOPIC CHOLECYSTECTOMY;  Surgeon: Aviva Signs, MD;  Location: [facility];  Service: General;  Laterality: N/A;  . COLONOSCOPY WITH PROPOFOL N/A 08/29/2020   Procedure: COLONOSCOPY WITH PROPOFOL;  Surgeon: Harvel Quale, MD;  [facility]: [facility];  Service: Gastroenterology;  Laterality: N/A;  11:15  . COLPOSCOPY    . ESOPHAGOGASTRODUODENOSCOPY (EGD) WITH PROPOFOL N/A 04/14/2017   Procedure: ESOPHAGOGASTRODUODENOSCOPY (EGD) WITH PROPOFOL;  Surgeon: Rogene Houston, MD;  [facility]: [facility];  Service: Endoscopy;  Laterality: N/A;  . ESOPHAGOGASTRODUODENOSCOPY (EGD) WITH PROPOFOL N/A 05/22/2020   Procedure: ESOPHAGOGASTRODUODENOSCOPY (EGD) WITH PROPOFOL;  Surgeon: Daneil Dolin, MD;  [facility]: [facility];  Service: Endoscopy;  Laterality: N/A;  . HERNIA REPAIR Bilateral    inguinal  . LASER ABLATION CONDOLAMATA N/A 12/02/2018   Procedure: LASER ABLATION CONDYLOMA ACCUMINATA LEFT AND RIGHT VULVA, PERINEUM AND PERIANAL (15 TOTAL);  Surgeon: Florian Buff, MD;  Location: [facility];  Service: Gynecology;  Laterality: N/A;  . POLYPECTOMY  08/29/2020   Procedure: POLYPECTOMY;  Surgeon: Montez Morita, Quillian Quince, MD;  Location: [facility];  Service: Gastroenterology;;     OB History    Gravida  2   Para  1   Term  1   Preterm      AB  1   Living  1     SAB  1   IAB      Ectopic      Multiple  0   Live Births  1           Family History  Problem Relation Age of Onset  . Anxiety disorder Mother   . Hyperlipidemia Mother   . Crohn's disease Sister   . Diabetes Maternal Grandfather   . Diabetes Cousin   . Learning disabilities Cousin     Social History   Tobacco Use  . Smoking status: Current Every Day Smoker    Packs/day: 0.50    Years: 5.00    Pack years: 2.50    Types:  Cigarettes  . Smokeless tobacco: Never Used  Vaping Use  . Vaping Use: Never used  Substance Use Topics  . Alcohol use: No  . Drug use: No    Home Medications Prior to Admission medications   Medication Sig Start Date End Date Taking? Authorizing Provider  albuterol (VENTOLIN HFA) 108 (90 Base) MCG/ACT inhaler Inhale 1-2 puffs into the lungs every 6 (six) hours as needed for wheezing or shortness of breath. 05/25/20   Perlie Mayo, NP  busPIRone (BUSPAR) 15 MG tablet Take 1 tablet (15 mg total) by mouth 3 (three) times daily. 08/10/20   Estill Dooms, NP  butalbital-acetaminophen-caffeine (FIORICET) [phone] MG tablet Take 1 tablet by mouth every 6 (six) hours as needed (migraine headaches). 09/05/20   Estill Dooms, NP  diphenhydrAMINE (BENADRYL) 25 MG tablet Take 25  mg by mouth every 6 (six) hours as needed (allergies.).    [provider]  etonogestrel (NEXPLANON) 68 MG IMPL implant 1 each by Subdermal route once.    [provider]  hydrOXYzine (ATARAX/VISTARIL) 25 MG tablet Take 1 tablet (25 mg total) by mouth every 6 (six) hours. 08/10/20   Estill Dooms, NP  megestrol (MEGACE) 40 MG tablet Take 2 daily Patient taking differently: Take 80 mg by mouth daily. Take 2 daily 08/10/20   Derrek Monaco A, NP  Na Sulfate-K Sulfate-Mg Sulf (SUPREP BOWEL PREP KIT) 17.5-3.13-1.6 GM/177ML SOLN Take 1 kit by mouth as directed. 08/08/20   Harvel Quale, MD  pantoprazole (PROTONIX) 20 MG tablet Take 1 tablet (20 mg total) by mouth daily. 09/08/20   Evalee Jefferson, PA-C  PARoxetine (PAXIL) 40 MG tablet Take 1 tablet (40 mg total) by mouth every morning. 08/10/20   Estill Dooms, NP  pregabalin (LYRICA) 50 MG capsule Take 1 capsule (50 mg total) by mouth at bedtime for 10 days, THEN 1 capsule (50 mg total) 2 (two) times daily. 08/23/20 10/22/20  Gillis Santa, MD  promethazine (PHENERGAN) 25 MG tablet Take 1 tablet (25 mg total) by mouth every 6  (six) hours as needed for nausea or vomiting. 09/08/20   Zoeya Gramajo, Almyra Free, PA-C  sucralfate (CARAFATE) 1 g tablet Take 1 tablet (1 g total) by mouth 3 (three) times daily before meals. 06/15/20   Minus Liberty, PA-C  SUMAtriptan (IMITREX) 25 MG tablet Take 25 mg by mouth every 2 (two) hours as needed for migraine. May repeat in 2 hours if headache persists or recurs.    [provider]  tiZANidine (ZANAFLEX) 4 MG tablet Take 1 tablet (4 mg total) by mouth daily as needed for muscle spasms. 07/19/20 09/17/20  Gillis Santa, MD  traZODone (DESYREL) 50 MG tablet TAKE 1 TABLET BY MOUTH AT BEDTIME Patient taking differently: Take 50 mg by mouth at bedtime.  07/14/20   Estill Dooms, NP    Allergies    Nicoderm [nicotine] and Nsaids  Review of Systems   Review of Systems  Constitutional: Negative for chills and fever.  HENT: Negative for congestion and sore throat.   Eyes: Negative.   Respiratory: Negative for chest tightness and shortness of breath.   Cardiovascular: Negative for chest pain.  Gastrointestinal: Positive for abdominal pain, diarrhea, nausea and vomiting.  Genitourinary: Negative.   Musculoskeletal: Negative for arthralgias, joint swelling and neck pain.  Skin: Negative.  Negative for rash and wound.  Neurological: Negative for dizziness, weakness, light-headedness, numbness and headaches.  Psychiatric/Behavioral: Negative.     Physical Exam Updated Vital Signs BP (!) 155/108 (BP Location: Left Arm)   Pulse 80   Temp (!) 97.5 F (36.4 C) (Oral)   Resp 20   Ht 5' 8"  (1.727 m)   Wt 104.3 kg   SpO2 100%   BMI 34.97 kg/m   Physical Exam Vitals and nursing note reviewed.  Constitutional:      General: She is in acute distress.     Appearance: She is well-developed and well-nourished. She is obese.     Comments: Actively vomiting, writhing during initial exam.  HENT:     Head: Normocephalic and atraumatic.  Eyes:     Conjunctiva/sclera: Conjunctivae  normal.  Cardiovascular:     Rate and Rhythm: Normal rate and regular rhythm.     Pulses: Intact distal pulses.     Heart sounds: Normal heart sounds.  Pulmonary:  Effort: Pulmonary effort is normal.     Breath sounds: Normal breath sounds. No wheezing.  Abdominal:     General: Abdomen is protuberant. Bowel sounds are normal.     Palpations: Abdomen is soft.     Tenderness: There is abdominal tenderness in the epigastric area and periumbilical area. There is no guarding or rebound. Negative signs include Murphy's sign.  Musculoskeletal:        General: Normal range of motion.     Cervical back: Normal range of motion.  Skin:    General: Skin is warm and dry.  Neurological:     Mental Status: She is alert.  Psychiatric:        Mood and Affect: Mood and affect normal.     ED Results / Procedures / Treatments   Labs (all labs ordered are listed, but only abnormal results are displayed) Labs Reviewed  COMPREHENSIVE METABOLIC PANEL - Abnormal; Notable for the following components:      Result Value   CO2 21 (*)    Glucose, Bld 149 (*)    All other components within normal limits  CBC - Abnormal; Notable for the following components:   WBC 17.2 (*)    All other components within normal limits  URINALYSIS, ROUTINE W REFLEX MICROSCOPIC - Abnormal; Notable for the following components:   APPearance HAZY (*)    pH 9.0 (*)    Ketones, ur 20 (*)    All other components within normal limits  CBG MONITORING, ED - Abnormal; Notable for the following components:   Glucose-Capillary 171 (*)    All other components within normal limits  RESP PANEL BY RT-PCR (FLU A&B, COVID) ARPGX2  LIPASE, BLOOD  HCG, QUANTITATIVE, PREGNANCY    EKG None  Radiology CT ABDOMEN PELVIS W CONTRAST  Result Date: 09/08/2020 CLINICAL DATA:  Acute abdominal pain EXAM: CT ABDOMEN AND PELVIS WITH CONTRAST TECHNIQUE: Multidetector CT imaging of the abdomen and pelvis was performed using the standard  protocol following bolus administration of intravenous contrast. CONTRAST:  164m OMNIPAQUE IOHEXOL 300 MG/ML  SOLN COMPARISON:  CT 05/18/2020, 01/18/2020 FINDINGS: Lower chest: Lung bases demonstrate no acute consolidation or effusion. Hepatobiliary: No focal liver abnormality is seen. Status post cholecystectomy. No biliary dilatation. Pancreas: Unremarkable. No pancreatic ductal dilatation or surrounding inflammatory changes. Spleen: Normal in size without focal abnormality. Adrenals/Urinary Tract: Adrenal glands are unremarkable. Kidneys are normal, without renal calculi, focal lesion, or hydronephrosis. Bladder is unremarkable. Stomach/Bowel: Stomach is within normal limits. Appendix appears normal. No evidence of bowel wall thickening, distention, or inflammatory changes. Collapsed appearance of the transverse, descending and rectosigmoid colon. Vascular/Lymphatic: No significant vascular findings are present. No enlarged abdominal or pelvic lymph nodes. Reproductive: Uterus and bilateral adnexa are unremarkable. Other: Trace free fluid in the pelvis.  No free air. Musculoskeletal: Degenerative changes. No acute or suspicious osseous abnormality IMPRESSION: 1. No CT evidence for acute intra-abdominal or pelvic abnormality. 2. Trace free fluid in the pelvis. Electronically Signed   By: KDonavan FoilM.D.   On: 09/08/2020 21:06    Procedures Procedures (including critical care time)  Medications Ordered in ED Medications  famotidine (PEPCID) IVPB 20 mg premix (20 mg Intravenous New Bag/Given 09/08/20 2243)  capsaicin (ZOSTRIX) 0.025 % cream ( Topical Given 09/08/20 2258)  ondansetron (ZOFRAN) injection 4 mg (4 mg Intravenous Given 09/08/20 1359)  morphine 4 MG/ML injection 4 mg (4 mg Intravenous Given 09/08/20 1438)  promethazine (PHENERGAN) injection 12.5 mg (12.5 mg Intravenous Given 09/08/20 1449)  HYDROmorphone (DILAUDID) injection 1 mg (1 mg Intravenous Given 09/08/20 1646)  diphenhydrAMINE  (BENADRYL) injection 12.5 mg (12.5 mg Intravenous Given 09/08/20 1645)  promethazine (PHENERGAN) injection 12.5 mg (12.5 mg Intravenous Given 09/08/20 1646)  LORazepam (ATIVAN) injection 1 mg (1 mg Intravenous Given 09/08/20 1829)  ondansetron (ZOFRAN) injection 4 mg (4 mg Intravenous Given 09/08/20 1830)  ondansetron (ZOFRAN) injection 4 mg (4 mg Intravenous Given 09/08/20 1947)  iohexol (OMNIPAQUE) 300 MG/ML solution 100 mL (100 mLs Intravenous Contrast Given 09/08/20 2050)  prochlorperazine (COMPAZINE) injection 10 mg (10 mg Intravenous Given 09/08/20 2245)  morphine 4 MG/ML injection 4 mg (4 mg Intravenous Given 09/08/20 2245)    ED Course  I have reviewed the triage vital signs and the nursing notes.  Pertinent labs & imaging results that were available during my care of the patient were reviewed by me and considered in my medical decision making (see chart for details).    MDM Rules/Calculators/A&P                          Patient presenting with acute on chronic upper abdominal pain along with nausea and uncontrolled vomiting.  She did have multiple episodes of dry heaving here without emesis production.  Chart was reviewed, she underwent colonoscopy on December 7 with biopsy, no significant biopsy findings, specifically no Crohn's or UC.  No diarrhea with today's complaint.  She did have an elevated WBC count, all other labs were essentially normal except for ketones in her urine suggesting dehydration.  She underwent a CT of her abdomen given her leukocytosis, no acute findings.  She was also given IV fluids for rehydration purposes.  She was given multiple doses of antiemetics with equivocal symptom relief.  She was also given narcotic pain medicine, she was last given a dose of Ativan which seemed to improve her symptoms more than the narcotics.  She does have a strong marijuana habit, smoking daily, usually 2-3 times per day.  We discussed the possibility of her symptoms being related  to this marijuana use, patient does not believe that this could be the source.  She was given information about this condition however.  She was prescribed additional Phenergan for as needed use.  Also started PPI.  Advised follow-up with her PCP for follow-up care if her symptoms persist or worsen.   At time of dc, pt still had complaints of severe abdominal pain and started to dry heave, producing small amount of clear emesis.  Pt will need admission for control of sx and further hydration.    Discussed with Dr. Josephine Cables who accepts pt for admission.  Final Clinical Impression(s) / ED Diagnoses Final diagnoses:  Chronic abdominal pain  Intractable nausea and vomiting    Rx / DC Orders ED Discharge Orders         Ordered    promethazine (PHENERGAN) 25 MG tablet  Every 6 hours PRN        09/08/20 2209    pantoprazole (PROTONIX) 20 MG tablet  Daily        09/08/20 2209           Evalee Jefferson, PA-C 09/08/20 2209    Evalee Jefferson, PA-C 09/08/20 2306    Fredia Sorrow, MD 09/30/20 [phone]

## 2020-09-08 NOTE — ED Notes (Signed)
PO challenge completed. Pt did not vomit. C/o of nausea that has been the same since coming to ED.

## 2020-09-08 NOTE — ED Notes (Signed)
Pt placed on bedpan to void.

## 2020-09-08 NOTE — ED Notes (Signed)
Pt standing and trying to walk around while being triaged. Stating she is going to lay in floor. Unable to hold thermometer in mouth.

## 2020-09-08 NOTE — ED Notes (Signed)
Pts back from CT.

## 2020-09-08 NOTE — ED Notes (Signed)
Pt assisted to the bedside commode

## 2020-09-08 NOTE — H&P (Signed)
History and Physical  Debbie Santana:[id number] DOB: [birth year] DOA: 09/08/2020  Referring physician: Evalee Jefferson, PA-C PCP: Perlie Mayo, NP  Patient coming from: Home  Chief Complaint: Abdominal pain  HPI: Debbie Santana is a 29 y.o. female with medical history significant for anxiety/depression, reflux esophagitis/erosive gastritis, tobacco abuse, marijuana use, chronic upper abdominal pain who presents to the emergency department via EMS and accompanied by boyfriend due to 1 day of recurrent upper abdominal pain, nausea and vomiting.  Abdominal pain was epigastric and was severe in nature, pain radiates to the back.  She endorsed several episodes of nonbloody vomiting with subsequent dry heaves, patient states that she has not been able to tolerate any oral intake due to vomiting.  She reports multiple episodes of diarrhea over the past several days (of note, this was chronic, intermittent and have started since September of this year).  She was admitted for similar presentation from 8/27-8/30.  ED Course:  In the emergency department, she was hemodynamically stable, work-up in the ED showed normal CBC and BMP except for leukocytosis and hyperglycemia.  Urinalysis was unimpressive for UTI, lipase was 24. CT abdomen and pelvis with contrast showed no evidence for acute intra-abdominal or pelvic abnormality. IV Dilaudid and morphine were given.  Patient was treated with several antiemetic IV medication, Pepcid and Ativan were given.  Capsaicin was applied to epigastric region.  Hospitalist was asked to admit patient for further evaluation and management.  Review of Systems: Constitutional: Negative for chills and fever.  HENT: Negative for ear pain and sore throat.   Eyes: Negative for pain and visual disturbance.  Respiratory: Negative for cough, chest tightness and shortness of breath.   Cardiovascular: Negative for chest pain and palpitations.  Gastrointestinal: Positive for  nausea, vomiting, diarrhea and abdominal pain.  Endocrine: Negative for polyphagia and polyuria.  Genitourinary: Negative for decreased urine volume, dysuria, enuresis, hematuria Musculoskeletal: Negative for arthralgias and back pain.  Skin: Negative for color change and rash.  Allergic/Immunologic: Negative for immunocompromised state.  Neurological: Negative for tremors, syncope, speech difficulty, weakness, light-headedness and headaches.  Hematological: Does not bruise/bleed easily.  All other systems reviewed and are negative  Past Medical History:  Diagnosis Date  . Anxiety   . Bradycardia 04/17/2017  . Chest pain on breathing 02/22/2020  . Chronic back pain   . Depression   . 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 Gastric ulcer   . Genital warts   . History of gestational hypertension 05/21/2018   Dx intrapartum  . History of kidney stones   . Intertrigo 11/16/2019  . Intractable abdominal pain 04/11/2017  . Intractable vomiting 05/20/2020  . Irregular intermenstrual bleeding 02/19/2019  . Mental disorder   . Migraine without aura and without status migrainosus, not intractable 02/19/2019  . Nausea 05/10/2020  . Nausea & vomiting 12/07/2018  . Nexplanon in place 02/19/2019  . Obesity 04/17/2017  . Peptic ulcer disease 04/13/2017  . Postpartum hypertension 06/23/2018   requiring norvasc, resolved at pp visit  . Sciatica   . Vaginal Pap smear, abnormal    Past Surgical History:  Procedure Laterality Date  . BACK SURGERY     ruptured disc.  . BIOPSY  04/14/2017   Procedure: BIOPSY;  Surgeon: Rogene Houston, MD;  Location: [facility];  Service: Endoscopy;;  gastric  . BIOPSY  05/22/2020   Procedure: BIOPSY;  Surgeon: Daneil Dolin, MD;  Location: [facility];  Service: Endoscopy;;  . BIOPSY  08/29/2020  Procedure: BIOPSY;  Surgeon: Montez Morita, Quillian Quince, MD;  Location: [facility];  Service: Gastroenterology;;  . CERVICAL ABLATION N/A 12/02/2018   Procedure: LASER ABLATION  OF CERVIX;  Surgeon: Florian Buff, MD;  Location: [facility];  Service: Gynecology;  Laterality: N/A;  . CHOLECYSTECTOMY N/A 04/17/2017   Procedure: LAPAROSCOPIC CHOLECYSTECTOMY;  Surgeon: Aviva Signs, MD;  Location: [facility];  Service: General;  Laterality: N/A;  . COLONOSCOPY WITH PROPOFOL N/A 08/29/2020   Procedure: COLONOSCOPY WITH PROPOFOL;  Surgeon: Harvel Quale, MD;  Location: [facility];  Service: Gastroenterology;  Laterality: N/A;  11:15  . COLPOSCOPY    . ESOPHAGOGASTRODUODENOSCOPY (EGD) WITH PROPOFOL N/A 04/14/2017   Procedure: ESOPHAGOGASTRODUODENOSCOPY (EGD) WITH PROPOFOL;  Surgeon: Rogene Houston, MD;  Location: [facility];  Service: Endoscopy;  Laterality: N/A;  . ESOPHAGOGASTRODUODENOSCOPY (EGD) WITH PROPOFOL N/A 05/22/2020   Procedure: ESOPHAGOGASTRODUODENOSCOPY (EGD) WITH PROPOFOL;  Surgeon: Daneil Dolin, MD;  Location: [facility];  Service: Endoscopy;  Laterality: N/A;  . HERNIA REPAIR Bilateral    inguinal  . LASER ABLATION CONDOLAMATA N/A 12/02/2018   Procedure: LASER ABLATION CONDYLOMA ACCUMINATA LEFT AND RIGHT VULVA, PERINEUM AND PERIANAL (15 TOTAL);  Surgeon: Florian Buff, MD;  Location: [facility];  Service: Gynecology;  Laterality: N/A;  . POLYPECTOMY  08/29/2020   Procedure: POLYPECTOMY;  Surgeon: Harvel Quale, MD;  Location: [facility];  Service: Gastroenterology;;    Social History:  reports that she has been smoking cigarettes. She has a 2.50 pack-year smoking history. She has never used smokeless tobacco. She reports that she does not drink alcohol and does not use drugs.   Allergies  Allergen Reactions  . Nicoderm [Nicotine] Itching    Sharp pain at site  . Nsaids Other (See Comments)    History of ulcers    Family History  Problem Relation Age of Onset  . Anxiety disorder Mother   . Hyperlipidemia Mother   . Crohn's disease Sister   . Diabetes Maternal Grandfather   . Diabetes Cousin   . Learning disabilities  Cousin      Prior to Admission medications   Medication Sig Start Date End Date Taking? Authorizing Provider  albuterol (VENTOLIN HFA) 108 (90 Base) MCG/ACT inhaler Inhale 1-2 puffs into the lungs every 6 (six) hours as needed for wheezing or shortness of breath. 05/25/20   Perlie Mayo, NP  busPIRone (BUSPAR) 15 MG tablet Take 1 tablet (15 mg total) by mouth 3 (three) times daily. 08/10/20   Estill Dooms, NP  butalbital-acetaminophen-caffeine (FIORICET) [phone] MG tablet Take 1 tablet by mouth every 6 (six) hours as needed (migraine headaches). 09/05/20   Estill Dooms, NP  diphenhydrAMINE (BENADRYL) 25 MG tablet Take 25 mg by mouth every 6 (six) hours as needed (allergies.).    [provider]  etonogestrel (NEXPLANON) 68 MG IMPL implant 1 each by Subdermal route once.    [provider]  hydrOXYzine (ATARAX/VISTARIL) 25 MG tablet Take 1 tablet (25 mg total) by mouth every 6 (six) hours. 08/10/20   Estill Dooms, NP  megestrol (MEGACE) 40 MG tablet Take 2 daily Patient taking differently: Take 80 mg by mouth daily. Take 2 daily 08/10/20   Derrek Monaco A, NP  Na Sulfate-K Sulfate-Mg Sulf (SUPREP BOWEL PREP KIT) 17.5-3.13-1.6 GM/177ML SOLN Take 1 kit by mouth as directed. 08/08/20   Harvel Quale, MD  pantoprazole (PROTONIX) 20 MG tablet Take 1 tablet (20 mg total) by mouth daily. 09/08/20  Evalee Jefferson, PA-C  PARoxetine (PAXIL) 40 MG tablet Take 1 tablet (40 mg total) by mouth every morning. 08/10/20   Estill Dooms, NP  pregabalin (LYRICA) 50 MG capsule Take 1 capsule (50 mg total) by mouth at bedtime for 10 days, THEN 1 capsule (50 mg total) 2 (two) times daily. 08/23/20 10/22/20  Gillis Santa, MD  promethazine (PHENERGAN) 25 MG tablet Take 1 tablet (25 mg total) by mouth every 6 (six) hours as needed for nausea or vomiting. 09/08/20   Idol, Almyra Free, PA-C  sucralfate (CARAFATE) 1 g tablet Take 1 tablet (1 g total) by mouth 3 (three)  times daily before meals. 06/15/20   Minus Liberty, PA-C  SUMAtriptan (IMITREX) 25 MG tablet Take 25 mg by mouth every 2 (two) hours as needed for migraine. May repeat in 2 hours if headache persists or recurs.    [provider]  tiZANidine (ZANAFLEX) 4 MG tablet Take 1 tablet (4 mg total) by mouth daily as needed for muscle spasms. 07/19/20 09/17/20  Gillis Santa, MD  traZODone (DESYREL) 50 MG tablet TAKE 1 TABLET BY MOUTH AT BEDTIME Patient taking differently: Take 50 mg by mouth at bedtime.  07/14/20   Estill Dooms, NP    Physical Exam: BP (!) 139/93   Pulse 85   Temp (!) 97.5 F (36.4 C) (Oral)   Resp (!) 30   Ht 5' 8"  (1.727 m)   Wt 104.3 kg   SpO2 97%   BMI 34.97 kg/m   . General: 29 y.o. year-old female well developed well nourished in no acute distress.  Alert and oriented x3. Marland Kitchen HEENT: Dry mucous membrane.  NCAT, EOMI . Neck: Supple, trachea medial . Cardiovascular: Regular rate and rhythm with no rubs or gallops.  No thyromegaly or JVD noted.  No lower extremity edema. 2/4 pulses in all 4 extremities. Marland Kitchen Respiratory: Clear to auscultation with no wheezes or rales. Good inspiratory effort. . Abdomen: Soft, tender to palpation in the epigastric area. Normal bowel sounds x4 quadrants. . Muskuloskeletal: No cyanosis, clubbing or edema noted bilaterally . Neuro: CN II-XII intact, strength, sensation, reflexes . Skin: No ulcerative lesions noted or rashes . Psychiatry: Judgement and insight appear normal. Mood is appropriate for condition and setting          Labs on Admission:  Basic Metabolic Panel: Recent Labs  Lab 09/08/20 1357  NA 138  K 3.7  CL 106  CO2 21*  GLUCOSE 149*  BUN 6  CREATININE 0.74  CALCIUM 9.3   Liver Function Tests: Recent Labs  Lab 09/08/20 1357  AST 17  ALT 16  ALKPHOS 67  BILITOT 0.7  PROT 6.9  ALBUMIN 3.8   Recent Labs  Lab 09/08/20 1357  LIPASE 24   No results for input(s): AMMONIA in the last 168  hours. CBC: Recent Labs  Lab 09/08/20 1357  WBC 17.2*  HGB 14.8  HCT 43.0  MCV 92.7  PLT 247   Cardiac Enzymes: No results for input(s): CKTOTAL, CKMB, CKMBINDEX, TROPONINI in the last 168 hours.  BNP (last 3 results) No results for input(s): BNP in the last 8760 hours.  ProBNP (last 3 results) No results for input(s): PROBNP in the last 8760 hours.  CBG: Recent Labs  Lab 09/08/20 1403  GLUCAP 171*    Radiological Exams on Admission: CT ABDOMEN PELVIS W CONTRAST  Result Date: 09/08/2020 CLINICAL DATA:  Acute abdominal pain EXAM: CT ABDOMEN AND PELVIS WITH CONTRAST TECHNIQUE: Multidetector CT imaging of  the abdomen and pelvis was performed using the standard protocol following bolus administration of intravenous contrast. CONTRAST:  165m OMNIPAQUE IOHEXOL 300 MG/ML  SOLN COMPARISON:  CT 05/18/2020, 01/18/2020 FINDINGS: Lower chest: Lung bases demonstrate no acute consolidation or effusion. Hepatobiliary: No focal liver abnormality is seen. Status post cholecystectomy. No biliary dilatation. Pancreas: Unremarkable. No pancreatic ductal dilatation or surrounding inflammatory changes. Spleen: Normal in size without focal abnormality. Adrenals/Urinary Tract: Adrenal glands are unremarkable. Kidneys are normal, without renal calculi, focal lesion, or hydronephrosis. Bladder is unremarkable. Stomach/Bowel: Stomach is within normal limits. Appendix appears normal. No evidence of bowel wall thickening, distention, or inflammatory changes. Collapsed appearance of the transverse, descending and rectosigmoid colon. Vascular/Lymphatic: No significant vascular findings are present. No enlarged abdominal or pelvic lymph nodes. Reproductive: Uterus and bilateral adnexa are unremarkable. Other: Trace free fluid in the pelvis.  No free air. Musculoskeletal: Degenerative changes. No acute or suspicious osseous abnormality IMPRESSION: 1. No CT evidence for acute intra-abdominal or pelvic abnormality. 2.  Trace free fluid in the pelvis. Electronically Signed   By: KDonavan FoilM.D.   On: 09/08/2020 21:06    EKG: I independently viewed the EKG done and my findings are as followed: No EKG was done in the ED  Assessment/Plan Present on Admission: . Abdominal pain . Reflux esophagitis . Tobacco abuse . Erosive gastritis . Anxiety and depression . Marijuana abuse in remission  Principal Problem:   Abdominal pain Active Problems:   Erosive gastritis   Leukocytosis   Hyperglycemia   Reflux esophagitis   Tobacco abuse   Intractable nausea and vomiting   Dehydration   Anxiety and depression   Marijuana abuse in remission   Obesity (BMI 30-39.9)  Intractable nausea, vomiting and abdominal pain R/O cyclic vomiting syndrome (cannabis hyperemesis syndrome) CT abdomen and pelvis with contrast showed no evidence for acute intra-abdominal or pelvic abnormality Lipase 24 Urinalysis was unimpressive for UTI Continue IV hydration  Continue IV Zofran 4 mg every 6 hours as needed Continue IV Protonix 40 mg twice daily Continue IV morphine 2 mg every 4 hours as needed UDS pending  GI will be consulted  Dehydration Continue IV hydration  Leukocytosis possibly reactive WBC 17.2, patient afebrile with no suspicion for any acute infectious process at this time Continue to monitor WBC with morning labs  Hyperglycemia with no known history of type 2 diabetes mellitus Blood glucose= 149; this is possibly due to a reactive process Continue to monitor blood glucose level with morning labs  Reflux esophagitis/erosive gastritis Continue IV Protonix 40 mg twice daily Consider Carafate once patient is able to tolerate oral intake  Chronic diarrhea Patient has not had any bowel movement since arrival at the ED Consider CT for and GI panel if patient continues to have diarrhea  Anxiety and depression BuSpar be resumed once patient is able to tolerate oral intake Paxil and trazodone will be  held due to side effect profile (considering that patient already has N/V)  Tobacco abuse/marijuana abuse Patient counseled on tobacco/THC cessation  Obesity (BMI 34.97) Diet and lifestyle modification   DVT prophylaxis: Lovenox  Code Status: Full code  Family Communication: Boyfriend at bedside (all questions answered to satisfaction)  Disposition Plan:  Patient is from:                        home Anticipated DC to:  home Anticipated DC date:               2-3 days Anticipated DC barriers:           Patient is unstable to be discharged home at this time due to intractable abdominal pain nausea and vomiting requiring inpatient treatment    Consults called: Gastroenterology  Admission status: Inpatient    Bernadette Hoit MD [facility]  09/09/2020, 12:41 AM

## 2020-09-09 ENCOUNTER — Inpatient Hospital Stay (HOSPITAL_COMMUNITY): Payer: Medicaid Other | Admitting: Anesthesiology

## 2020-09-09 ENCOUNTER — Encounter (HOSPITAL_COMMUNITY): Payer: Self-pay | Admitting: Internal Medicine

## 2020-09-09 ENCOUNTER — Encounter (HOSPITAL_COMMUNITY)
Admission: EM | Disposition: A | Discharge status: Home / Self Care | Payer: Self-pay | Source: Home / Self Care | Attending: Internal Medicine

## 2020-09-09 DIAGNOSIS — D72823 Leukemoid reaction: Secondary | ICD-10-CM

## 2020-09-09 DIAGNOSIS — F12188 Cannabis abuse with other cannabis-induced disorder: Secondary | ICD-10-CM

## 2020-09-09 DIAGNOSIS — G8929 Other chronic pain: Secondary | ICD-10-CM

## 2020-09-09 DIAGNOSIS — R1013 Epigastric pain: Secondary | ICD-10-CM

## 2020-09-09 DIAGNOSIS — K2100 Gastro-esophageal reflux disease with esophagitis, without bleeding: Principal | ICD-10-CM

## 2020-09-09 DIAGNOSIS — K2970 Gastritis, unspecified, without bleeding: Secondary | ICD-10-CM

## 2020-09-09 DIAGNOSIS — R112 Nausea with vomiting, unspecified: Secondary | ICD-10-CM

## 2020-09-09 HISTORY — PX: ESOPHAGOGASTRODUODENOSCOPY (EGD) WITH PROPOFOL: SHX5813

## 2020-09-09 HISTORY — PX: BIOPSY: SHX5522

## 2020-09-09 LAB — CBC
HCT: 42.1 % (ref 36.0–46.0)
Hemoglobin: 14.1 g/dL (ref 12.0–15.0)
MCH: 31.4 pg (ref 26.0–34.0)
MCHC: 33.5 g/dL (ref 30.0–36.0)
MCV: 93.8 fL (ref 80.0–100.0)
Platelets: 234 10*3/uL (ref 150–400)
RBC: 4.49 MIL/uL (ref 3.87–5.11)
RDW: 12.8 % (ref 11.5–15.5)
WBC: 17.7 10*3/uL — ABNORMAL HIGH (ref 4.0–10.5)
nRBC: 0 % (ref 0.0–0.2)

## 2020-09-09 LAB — PROTIME-INR
INR: 1 (ref 0.8–1.2)
Prothrombin Time: 13.2 seconds (ref 11.4–15.2)

## 2020-09-09 LAB — RESP PANEL BY RT-PCR (FLU A&B, COVID) ARPGX2
Influenza A by PCR: NEGATIVE
Influenza B by PCR: NEGATIVE
SARS Coronavirus 2 by RT PCR: NEGATIVE

## 2020-09-09 LAB — COMPREHENSIVE METABOLIC PANEL
ALT: 14 U/L (ref 0–44)
AST: 12 U/L — ABNORMAL LOW (ref 15–41)
Albumin: 3.6 g/dL (ref 3.5–5.0)
Alkaline Phosphatase: 64 U/L (ref 38–126)
Anion gap: 9 (ref 5–15)
BUN: 6 mg/dL (ref 6–20)
CO2: 21 mmol/L — ABNORMAL LOW (ref 22–32)
Calcium: 8.9 mg/dL (ref 8.9–10.3)
Chloride: 105 mmol/L (ref 98–111)
Creatinine, Ser: 0.57 mg/dL (ref 0.44–1.00)
GFR, Estimated: >60 mL/min (ref >60)
Glucose, Bld: 116 mg/dL — ABNORMAL HIGH (ref 70–99)
Potassium: 3.3 mmol/L — ABNORMAL LOW (ref 3.5–5.1)
Sodium: 135 mmol/L (ref 135–145)
Total Bilirubin: 0.4 mg/dL (ref 0.3–1.2)
Total Protein: 6.6 g/dL (ref 6.5–8.1)

## 2020-09-09 LAB — RESPIRATORY PANEL BY PCR

## 2020-09-09 LAB — PHOSPHORUS: Phosphorus: 3.5 mg/dL (ref 2.5–4.6)

## 2020-09-09 LAB — RAPID URINE DRUG SCREEN, HOSP PERFORMED
Amphetamines: NOT DETECTED
Barbiturates: POSITIVE — AB
Benzodiazepines: POSITIVE — AB
Cocaine: NOT DETECTED
Opiates: POSITIVE — AB
Tetrahydrocannabinol: POSITIVE — AB

## 2020-09-09 LAB — APTT: aPTT: 29 seconds (ref 24–36)

## 2020-09-09 LAB — MAGNESIUM: Magnesium: 1.9 mg/dL (ref 1.7–2.4)

## 2020-09-09 SURGERY — ESOPHAGOGASTRODUODENOSCOPY (EGD) WITH PROPOFOL
Anesthesia: General

## 2020-09-09 MED ORDER — BUSPIRONE HCL 5 MG PO TABS
15.0000 mg | ORAL_TABLET | Freq: Three times a day (TID) | ORAL | Status: DC
  Administered 2020-09-09 – 2020-09-11 (×7): 15 mg via ORAL
  Filled 2020-09-09 (×7): qty 3

## 2020-09-09 MED ORDER — PROPOFOL 10 MG/ML IV BOLUS
INTRAVENOUS | Status: AC
  Filled 2020-09-09: qty 20

## 2020-09-09 MED ORDER — PAROXETINE HCL 20 MG PO TABS
40.0000 mg | ORAL_TABLET | Freq: Every day | ORAL | Status: DC
  Administered 2020-09-09 – 2020-09-11 (×3): 40 mg via ORAL
  Filled 2020-09-09 (×3): qty 2

## 2020-09-09 MED ORDER — ONDANSETRON HCL 4 MG/2ML IJ SOLN
4.0000 mg | Freq: Four times a day (QID) | INTRAMUSCULAR | Status: DC
  Administered 2020-09-09 – 2020-09-11 (×9): 4 mg via INTRAVENOUS
  Filled 2020-09-09 (×9): qty 2

## 2020-09-09 MED ORDER — LACTATED RINGERS IV SOLN: INTRAVENOUS | Status: DC | PRN

## 2020-09-09 MED ORDER — SODIUM CHLORIDE 0.9 % IV SOLN: Freq: Once | INTRAVENOUS | Status: AC

## 2020-09-09 MED ORDER — LACTATED RINGERS IV SOLN: INTRAVENOUS | Status: DC

## 2020-09-09 MED ORDER — HYDROXYZINE HCL 25 MG PO TABS
25.0000 mg | ORAL_TABLET | Freq: Three times a day (TID) | ORAL | Status: DC
  Administered 2020-09-09 – 2020-09-11 (×7): 25 mg via ORAL
  Filled 2020-09-09 (×7): qty 1

## 2020-09-09 MED ORDER — DIPHENHYDRAMINE HCL 50 MG/ML IJ SOLN
25.0000 mg | Freq: Once | INTRAMUSCULAR | Status: AC
  Administered 2020-09-09: 06:00:00 25 mg via INTRAVENOUS
  Filled 2020-09-09: qty 1

## 2020-09-09 MED ORDER — MORPHINE SULFATE (PF) 2 MG/ML IV SOLN
2.0000 mg | INTRAVENOUS | Status: DC | PRN
  Administered 2020-09-09 – 2020-09-11 (×15): 2 mg via INTRAVENOUS
  Filled 2020-09-09 (×15): qty 1

## 2020-09-09 MED ORDER — IPRATROPIUM-ALBUTEROL 0.5-2.5 (3) MG/3ML IN SOLN
3.0000 mL | Freq: Three times a day (TID) | RESPIRATORY_TRACT | Status: DC
  Administered 2020-09-09 – 2020-09-10 (×5): 3 mL via RESPIRATORY_TRACT
  Filled 2020-09-09 (×6): qty 3

## 2020-09-09 MED ORDER — POTASSIUM CHLORIDE IN NACL 20-0.9 MEQ/L-% IV SOLN
INTRAVENOUS | Status: DC
  Administered 2020-09-09: 23:00:00 100 mL/h via INTRAVENOUS

## 2020-09-09 MED ORDER — METOCLOPRAMIDE HCL 5 MG/ML IJ SOLN
10.0000 mg | Freq: Once | INTRAMUSCULAR | Status: AC
  Administered 2020-09-09: 06:00:00 10 mg via INTRAVENOUS
  Filled 2020-09-09: qty 2

## 2020-09-09 MED ORDER — ONDANSETRON HCL 4 MG/2ML IJ SOLN
4.0000 mg | Freq: Four times a day (QID) | INTRAMUSCULAR | Status: DC | PRN
  Administered 2020-09-09 (×2): 4 mg via INTRAVENOUS
  Filled 2020-09-09 (×2): qty 2

## 2020-09-09 MED ORDER — SODIUM CHLORIDE 0.9 % IV SOLN: INTRAVENOUS | Status: DC

## 2020-09-09 MED ORDER — PREGABALIN 50 MG PO CAPS
50.0000 mg | ORAL_CAPSULE | Freq: Two times a day (BID) | ORAL | Status: DC
  Administered 2020-09-09 – 2020-09-11 (×5): 50 mg via ORAL
  Filled 2020-09-09 (×5): qty 1

## 2020-09-09 MED ORDER — PROPOFOL 10 MG/ML IV BOLUS
INTRAVENOUS | Status: DC | PRN
  Administered 2020-09-09: 200 mg via INTRAVENOUS

## 2020-09-09 MED ORDER — ENOXAPARIN SODIUM 40 MG/0.4ML ~~LOC~~ SOLN
40.0000 mg | SUBCUTANEOUS | Status: DC
  Administered 2020-09-09 – 2020-09-11 (×3): 40 mg via SUBCUTANEOUS
  Filled 2020-09-09 (×4): qty 0.4

## 2020-09-09 MED ORDER — HYDROMORPHONE HCL 1 MG/ML IJ SOLN
0.2500 mg | INTRAMUSCULAR | Status: DC | PRN
  Administered 2020-09-09: 0.5 mg via INTRAVENOUS

## 2020-09-09 MED ORDER — PROMETHAZINE HCL 25 MG/ML IJ SOLN
12.5000 mg | Freq: Once | INTRAMUSCULAR | Status: AC | PRN
  Administered 2020-09-09: 25 mg via INTRAMUSCULAR

## 2020-09-09 MED ORDER — BUDESONIDE 0.5 MG/2ML IN SUSP
0.5000 mg | Freq: Two times a day (BID) | RESPIRATORY_TRACT | Status: DC
  Administered 2020-09-09 – 2020-09-11 (×5): 0.5 mg via RESPIRATORY_TRACT
  Filled 2020-09-09 (×5): qty 2

## 2020-09-09 MED ORDER — LORAZEPAM 2 MG/ML IJ SOLN
0.5000 mg | Freq: Once | INTRAMUSCULAR | Status: AC
  Administered 2020-09-09: 09:00:00 0.5 mg via INTRAVENOUS
  Filled 2020-09-09: qty 1

## 2020-09-09 MED ORDER — PANTOPRAZOLE SODIUM 40 MG IV SOLR
40.0000 mg | Freq: Two times a day (BID) | INTRAVENOUS | Status: DC
  Administered 2020-09-09 – 2020-09-11 (×6): 40 mg via INTRAVENOUS
  Filled 2020-09-09 (×6): qty 40

## 2020-09-09 MED ORDER — HYDROMORPHONE HCL 1 MG/ML IJ SOLN
INTRAMUSCULAR | Status: AC
  Filled 2020-09-09: qty 0.5

## 2020-09-09 MED ORDER — NICOTINE 21 MG/24HR TD PT24
21.0000 mg | MEDICATED_PATCH | Freq: Every day | TRANSDERMAL | Status: DC
  Administered 2020-09-09 – 2020-09-11 (×3): 21 mg via TRANSDERMAL
  Filled 2020-09-09 (×3): qty 1

## 2020-09-09 MED ORDER — PROMETHAZINE HCL 25 MG/ML IJ SOLN
12.5000 mg | Freq: Four times a day (QID) | INTRAMUSCULAR | Status: DC | PRN
  Administered 2020-09-09 (×2): 12.5 mg via INTRAVENOUS
  Filled 2020-09-09 (×6): qty 1

## 2020-09-09 MED ORDER — DIPHENHYDRAMINE HCL 50 MG/ML IJ SOLN
25.0000 mg | Freq: Once | INTRAMUSCULAR | Status: AC
  Administered 2020-09-09: 13:00:00 25 mg via INTRAVENOUS
  Filled 2020-09-09: qty 1

## 2020-09-09 NOTE — Anesthesia Postprocedure Evaluation (Signed)
Anesthesia Post Note  Patient: Debbie Santana  Procedure(s) Performed: ESOPHAGOGASTRODUODENOSCOPY (EGD) WITH PROPOFOL (N/A ) BIOPSY  Patient location during evaluation: PACU Anesthesia Type: General Level of consciousness: awake Pain management: pain level not controlled Vital Signs Assessment: post-procedure vital signs reviewed and stable Respiratory status: spontaneous breathing Cardiovascular status: blood pressure returned to baseline Postop Assessment: no headache Anesthetic complications: no   No complications documented.   Last Vitals:  Vitals:   09/09/20 0530 09/09/20 1009  BP: 129/69 118/73  Pulse: 66 76  Resp: 18 18  Temp: 36.7 C   SpO2: 97% 96%    Last Pain:  Vitals:   09/09/20 1009  TempSrc:   PainSc: [place]

## 2020-09-09 NOTE — Transfer of Care (Signed)
Immediate Anesthesia Transfer of Care Note  Patient: Debbie Santana  Procedure(s) Performed: ESOPHAGOGASTRODUODENOSCOPY (EGD) WITH PROPOFOL (N/A ) BIOPSY  Patient Location: PACU  Anesthesia Type:General  Level of Consciousness: awake  Airway & Oxygen Therapy: Patient Spontanous Breathing  Post-op Assessment: Report given to RN and Post -op Vital signs reviewed and stable  Post vital signs: Reviewed and stable  Last Vitals:  Vitals Value Taken Time  BP    Temp    Pulse    Resp    SpO2      Last Pain:  Vitals:   09/09/20 1009  TempSrc:   PainSc: 6       Patients Stated Pain Goal: 7 (87/19/59 7471)  Complications: No complications documented.

## 2020-09-09 NOTE — Progress Notes (Signed)
PROGRESS NOTE  Debbie Santana:[id number] DOB: [birth year] DOA: 09/08/2020 PCP: Perlie Mayo, NP  Brief History:  29 year old female with a history of anxiety/depression, reflux esophagitis/erosive gastritis, tobacco abuse, marijuana use presenting with 3 to 4-day history of nausea, vomiting, and worsening epigastric pain.  Patient states that she began feeling nauseous and unwell on 09/04/2020.  This progressed to worsening epigastric pain and uncontrollable nausea and vomiting on 09/08/2020.  She was unable to keep down any fluids or pills.  The patient denies any recent travels or eating any uncooked foods.  She states that her only new medicine is Lyrica.  She denies any headache, chest pain, shortness of breath, hemoptysis, diarrhea, hematochezia, melena, although she has had some loose stools..  She has had bilious vomitus.  She continues to smoke marijuana 3 times per week.  She smokes tobacco 1 pack/day. Notably, the patient had a colonoscopy performed on 08/29/2020 which was unremarkable.  The pathology on her biopsies revealed no active inflammation in her colon.  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  In addition, the patient had an EGD on 05/22/2020 which showed erosive esophagitis and gastric erosions.  The biopsies that time showed mild chronic gastritis.  H. pylori was negative.  She was started on Protonix twice daily and Carafate.  She has since stopped taking Protonix, but she denies any NSAID use.  Notably, the patient had an admission from 05/19/2020 to 05/22/2020 with a similar presentation. In the emergency department, the patient continued to have dry heaves.  She was afebrile hemodynamically stable with oxygen saturation 97% room air.  BMP was essentially unremarkable with normal LFTs.  Lipase was 24.  WBC 17.2, hemoglobin 14.1, platelets 234,000.  CT of the abdomen and pelvis was unremarkable.  The patient was started on IV fluids and placed on bowel  rest.  Assessment/Plan: Intractable nausea/vomiting/abdominal pain -Likely due to reflux esophagitis and gastritis -Suspect there is a component of cannabis hyperemesis syndromeand psychiatric overlay -Concerned about underlying erosive gastropathy/reflux esophagitis -Continue PPI twice daily -Continue IV fluids -continueZofran around-the-clock -Lipase 24 -05/18/2020 CT abdomen--essentially negative -09/08/20 CT abd/pelvis--unremarkable -UA negative for pyuria -8/30 EGD--Erosive reflux esophagitis. Gastric erosions?status post biopsy; normal duodenal bulb andsecond portion of the duodenum; symptoms exacerbated by recent NSAID use -08/29/20 Colonoscopy--no inflammation entire colon  Dehydration -Continued IV fluids  Leukemoid reaction -Suspect stress demargination -Do not plan to start antibiotics presently -Monitor patient clinically--afebrile and hemodynamically stable -personally reviewed CXR--no infiltrates or edema -UA--neg for pyuria  Acute bronchitis -Start duo nebs -Start Pulmicort -viral respiratory panel  Anxiety/depression -Continue BuSpar, Atarax, Paxil, trazodone  Tobacco abuse -NicoDerm patch deferred -Cessation discussed  Morbid obesity -BMI 34.97 -Lifestyle modification  Hypokalemia -Repleted -Magnesium 1.9 -Addedpotassium to IV fluids  Chronic Pain syndrome -she follow pain management--Dr. Holley Raring -continue Lyrica if tolerates po -scheduled for "back injection" 12/20    Status is: Inpatient  Remains inpatient appropriate because:IV treatments appropriate due to intensity of illness or inability to take PO   Dispo: The patient is from: Home              Anticipated d/c is to: Home              Anticipated d/c date is: 2 days              Patient currently is not medically stable to d/c.        Family Communication:   Mother update at  bedside 12/18  Consultants:  none  Code Status:  FULL   DVT Prophylaxis:   [place]  Lovenox   Procedures: As Listed in Progress Note Above  Antibiotics: None      Subjective: Patient continues to have dry heaves.  She states that she has epigastric abdominal pain.  She denies any headache, chest pain, shortness breath, hematochezia, melena, dysuria, hematuria.  Objective: Vitals:   09/09/20 0030 09/09/20 0114 09/09/20 0116 09/09/20 0530  BP: (!) 143/113 137/72 132/76 129/69  Pulse:  61 82 66  Resp: (!) 23 19 18 18   Temp: 98.8 F (37.1 C) 98.1 F (36.7 C) 98.2 F (36.8 C) 98.1 F (36.7 C)  TempSrc: Oral     SpO2: 99% 96% 95% 97%  Weight:      Height:        Intake/Output Summary (Last 24 hours) at 09/09/2020 0840 Last data filed at 09/09/2020 0008 Gross per 24 hour  Intake 50 ml  Output --  Net 50 ml   Weight change:  Exam:   General:  Pt is alert, follows commands appropriately, not in acute distress  HEENT: No icterus, No thrush, No neck mass, [place]/AT  Cardiovascular: RRR, S1/S2, no rubs, no gallops  Respiratory: Bibasilar rales.  Mild bibasilar wheeze  Abdomen: Soft/+BS, non tender, non distended, no guarding  Extremities: No edema, No lymphangitis, No petechiae, No rashes, no synovitis   Data Reviewed: I have personally reviewed following labs and imaging studies Basic Metabolic Panel: Recent Labs  Lab 09/08/20 1357 09/09/20 0425  NA 138 135  K 3.7 3.3*  CL 106 105  CO2 21* 21*  GLUCOSE 149* 116*  BUN 6 6  CREATININE 0.74 0.57  CALCIUM 9.3 8.9  MG  --  1.9  PHOS  --  3.5   Liver Function Tests: Recent Labs  Lab 09/08/20 1357 09/09/20 0425  AST 17 12*  ALT 16 14  ALKPHOS 67 64  BILITOT 0.7 0.4  PROT 6.9 6.6  ALBUMIN 3.8 3.6   Recent Labs  Lab 09/08/20 1357  LIPASE 24   No results for input(s): AMMONIA in the last 168 hours. Coagulation Profile: Recent Labs  Lab 09/09/20 0425  INR 1.0   CBC: Recent Labs  Lab 09/08/20 1357 09/09/20 0425  WBC 17.2* 17.7*  HGB 14.8 14.1  HCT 43.0 42.1  MCV 92.7  93.8  PLT 247 234   Cardiac Enzymes: No results for input(s): CKTOTAL, CKMB, CKMBINDEX, TROPONINI in the last 168 hours. BNP: Invalid input(s): POCBNP CBG: Recent Labs  Lab 09/08/20 1403  GLUCAP 171*   HbA1C: No results for input(s): HGBA1C in the last 72 hours. Urine analysis:    Component Value Date/Time   COLORURINE YELLOW 09/08/2020 1615   APPEARANCEUR HAZY (A) 09/08/2020 1615   APPEARANCEUR Clear 07/18/2020 1630   LABSPEC 1.014 09/08/2020 1615   PHURINE 9.0 (H) 09/08/2020 1615   GLUCOSEU NEGATIVE 09/08/2020 1615   HGBUR NEGATIVE 09/08/2020 1615   BILIRUBINUR NEGATIVE 09/08/2020 1615   BILIRUBINUR Negative 07/18/2020 1630   KETONESUR 20 (A) 09/08/2020 1615   PROTEINUR NEGATIVE 09/08/2020 1615   UROBILINOGEN 0.2 05/14/2015 0028   NITRITE NEGATIVE 09/08/2020 1615   LEUKOCYTESUR NEGATIVE 09/08/2020 1615   Sepsis Labs: @LABRCNTIP (procalcitonin:4,lacticidven:4) ) Recent Results (from the past 240 hour(s))  Resp Panel by RT-PCR (Flu A&B, Covid) Nasopharyngeal Swab     Status: None   Collection Time: 09/08/20 10:30 PM   Specimen: Nasopharyngeal Swab; Nasopharyngeal(NP) swabs in vial transport medium  Result Value  Ref Range Status   SARS Coronavirus 2 by RT PCR NEGATIVE NEGATIVE Final    Comment: (NOTE) SARS-CoV-2 target nucleic acids are NOT DETECTED.  The SARS-CoV-2 RNA is generally detectable in upper respiratory specimens during the acute phase of infection. The lowest concentration of SARS-CoV-2 viral copies this assay can detect is 138 copies/mL. A negative result does not preclude SARS-Cov-2 infection and should not be used as the sole basis for treatment or other patient management decisions. A negative result may occur with  improper specimen collection/handling, submission of specimen other than nasopharyngeal swab, presence of viral mutation(s) within the areas targeted by this assay, and inadequate number of viral copies(<138 copies/mL). A negative result  must be combined with clinical observations, patient history, and epidemiological information. The expected result is Negative.  Fact Sheet for Patients:  EntrepreneurPulse.com.au  Fact Sheet for Healthcare Providers:  IncredibleEmployment.be  This test is no t yet approved or cleared by the [place] FDA and  has been authorized for detection and/or diagnosis of SARS-CoV-2 by FDA under an Emergency Use Authorization (EUA). This EUA will remain  in effect (meaning this test can be used) for the duration of the COVID-19 declaration under Section 564(b)(1) of the Act, 21 U.S.C.section 360bbb-3(b)(1), unless the authorization is terminated  or revoked sooner.       Influenza A by PCR NEGATIVE NEGATIVE Final   Influenza B by PCR NEGATIVE NEGATIVE Final    Comment: (NOTE) The Xpert Xpress SARS-CoV-2/FLU/RSV plus assay is intended as an aid in the diagnosis of influenza from Nasopharyngeal swab specimens and should not be used as a sole basis for treatment. Nasal washings and aspirates are unacceptable for Xpert Xpress SARS-CoV-2/FLU/RSV testing.  Fact Sheet for Patients: EntrepreneurPulse.com.au  Fact Sheet for Healthcare Providers: IncredibleEmployment.be  This test is not yet approved or cleared by the [place] FDA and has been authorized for detection and/or diagnosis of SARS-CoV-2 by FDA under an Emergency Use Authorization (EUA). This EUA will remain in effect (meaning this test can be used) for the duration of the COVID-19 declaration under Section 564(b)(1) of the Act, 21 U.S.C. section 360bbb-3(b)(1), unless the authorization is terminated or revoked.  Performed at [facility], [address] [place], [place] [address]      Scheduled Meds: . busPIRone  15 mg Oral TID  . capsaicin   Topical BID  . enoxaparin (LOVENOX) injection  40 mg Subcutaneous Q24H  . hydrOXYzine  25 mg Oral TID   . LORazepam  0.5 mg Intravenous Once  . nicotine  21 mg Transdermal Daily  . ondansetron (ZOFRAN) IV  4 mg Intravenous Q6H  . pantoprazole (PROTONIX) IV  40 mg Intravenous Q12H  . PARoxetine  40 mg Oral Daily  . pregabalin  50 mg Oral BID   Continuous Infusions:  Procedures/Studies: CT ABDOMEN PELVIS W CONTRAST  Result Date: 09/08/2020 CLINICAL DATA:  Acute abdominal pain EXAM: CT ABDOMEN AND PELVIS WITH CONTRAST TECHNIQUE: Multidetector CT imaging of the abdomen and pelvis was performed using the standard protocol following bolus administration of intravenous contrast. CONTRAST:  139mL OMNIPAQUE IOHEXOL 300 MG/ML  SOLN COMPARISON:  CT 05/18/2020, 01/18/2020 FINDINGS: Lower chest: Lung bases demonstrate no acute consolidation or effusion. Hepatobiliary: No focal liver abnormality is seen. Status post cholecystectomy. No biliary dilatation. Pancreas: Unremarkable. No pancreatic ductal dilatation or surrounding inflammatory changes. Spleen: Normal in size without focal abnormality. Adrenals/Urinary Tract: Adrenal glands are unremarkable. Kidneys are normal, without renal calculi, focal lesion, or hydronephrosis. Bladder is unremarkable.  Stomach/Bowel: Stomach is within normal limits. Appendix appears normal. No evidence of bowel wall thickening, distention, or inflammatory changes. Collapsed appearance of the transverse, descending and rectosigmoid colon. Vascular/Lymphatic: No significant vascular findings are present. No enlarged abdominal or pelvic lymph nodes. Reproductive: Uterus and bilateral adnexa are unremarkable. Other: Trace free fluid in the pelvis.  No free air. Musculoskeletal: Degenerative changes. No acute or suspicious osseous abnormality IMPRESSION: 1. No CT evidence for acute intra-abdominal or pelvic abnormality. 2. Trace free fluid in the pelvis. Electronically Signed   By: Donavan Foil M.D.   On: 09/08/2020 21:06    Orson Eva, DO  [facility]  If 7PM-7AM, please  contact night-coverage [email or web address] Password [email or web address] 09/09/2020, 8:40 AM   LOS: 1 day
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
## 2020-09-09 NOTE — ED Notes (Signed)
Debbie Santana [id number] & Mom [phone]

## 2020-09-09 NOTE — Plan of Care (Signed)
  Problem: Education: Goal: Knowledge of General Education information will improve Description: Including pain rating scale, medication(s)/side effects and non-pharmacologic comfort measures Outcome: Not Progressing   Problem: Nutrition: Goal: Adequate nutrition will be maintained Outcome: Not Progressing   Problem: Safety: Goal: Ability to remain free from injury will improve Outcome: Not Progressing

## 2020-09-09 NOTE — Op Note (Signed)
[facility] Patient Name: Ronne Savoia Procedure Date: 09/09/2020 9:47 AM MRN: [id number] Date of Birth: [birth year] Attending MD: Elon Alas. Abbey Chatters DO CSN: [id number] Age: 29 Admit Type: Outpatient Procedure:                Upper GI endoscopy Indications:              Epigastric abdominal pain, Nausea with vomiting Providers:                Elon Alas. Abbey Chatters, DO, Lurline Del, RN, Wynonia Musty Tech, Technician, Randa Spike,                            Technician Referring MD:              Medicines:                See the Anesthesia note for documentation of the                            administered medications Complications:            No immediate complications. Estimated Blood Loss:     Estimated blood loss was minimal. Procedure:                Pre-Anesthesia Assessment:                           - The anesthesia plan was to use monitored                            anesthesia care (MAC).                           After obtaining informed consent, the endoscope was                            passed under direct vision. Throughout the                            procedure, the patient's blood pressure, pulse, and                            oxygen saturations were monitored continuously. The                            [id number]-[id number] ([id number]) scope was introduced through the                            mouth, and advanced to the second part of duodenum.                            The upper GI endoscopy was accomplished without                            difficulty. The patient tolerated  the procedure                            well. Scope In: 10:15:35 AM Scope Out: 10:21:24 AM Total Procedure Duration: 0 hours 5 minutes 49 seconds  Findings:      Moderately severe esophagitis with no bleeding was found at the       gastroesophageal junction.      Diffuse moderate inflammation characterized by erosions and erythema was       found in the entire  examined stomach. Biopsies were taken with a cold       forceps for Helicobacter pylori testing.      The duodenal bulb, first portion of the duodenum and second portion of       the duodenum were normal. Impression:               - Moderately severe reflux esophagitis with no                            bleeding.                           - Gastritis. Biopsied.                           - Normal duodenal bulb, first portion of the                            duodenum and second portion of the duodenum. Moderate Sedation:      Per Anesthesia Care Recommendation:           - Return patient to hospital ward for ongoing care.                           - Clear liquid diet.                           - Continue on twice daily PPI for 8 weeks and then                            decrease to once daily thereafter. Avoid NSAIDs.                            Continue on IV fluids and antiemetics as needed. I                            will continue to follow her. We will treat for H.                            pylori if biopsies positive. Procedure Code(s):        --- Professional ---                           [phone], Esophagogastroduodenoscopy, flexible,                            transoral; with biopsy, single or multiple Diagnosis  Code(s):        --- Professional ---                           K21.00, Gastro-esophageal reflux disease with                            esophagitis, without bleeding                           K29.70, Gastritis, unspecified, without bleeding                           R10.13, Epigastric pain                           R11.2, Nausea with vomiting, unspecified CPT copyright 2019 American Medical Association. All rights reserved. The codes documented in this report are preliminary and upon coder review may  be revised to meet current compliance requirements. Elon Alas. Abbey Chatters, DO [place] Abbey Chatters, DO 09/09/2020 10:37:05 AM This report has been signed electronically. Number  of Addenda: 0

## 2020-09-09 NOTE — Anesthesia Preprocedure Evaluation (Signed)
Anesthesia Evaluation  Patient identified by MRN, date of birth, ID band Patient awake    Reviewed: Allergy & Precautions, H&P , NPO status , Patient's Chart, lab work & pertinent test results, reviewed documented beta blocker date and time   Airway Mallampati: II  TM Distance: >3 FB Neck ROM: full    Dental no notable dental hx. (+) Teeth Intact   Pulmonary neg pulmonary ROS, Current Smoker,    Pulmonary exam normal breath sounds clear to auscultation       Cardiovascular Exercise Tolerance: Good hypertension, negative cardio ROS   Rhythm:regular Rate:Normal     Neuro/Psych  Headaches, PSYCHIATRIC DISORDERS Anxiety Depression  Neuromuscular disease    GI/Hepatic Neg liver ROS, PUD, GERD  Medicated,  Endo/Other  negative endocrine ROS  Renal/GU negative Renal ROS  negative genitourinary   Musculoskeletal   Abdominal   Peds  Hematology negative hematology ROS (+)   Anesthesia Other Findings   Reproductive/Obstetrics negative OB ROS                             Anesthesia Physical Anesthesia Plan  ASA: III and emergent  Anesthesia Plan: General   Post-op Pain Management:    Induction:   PONV Risk Score and Plan: Propofol infusion  Airway Management Planned:   Additional Equipment:   Intra-op Plan:   Post-operative Plan:   Informed Consent: I have reviewed the patients History and Physical, chart, labs and discussed the procedure including the risks, benefits and alternatives for the proposed anesthesia with the patient or authorized representative who has indicated his/her understanding and acceptance.     Dental Advisory Given  Plan Discussed with: CRNA  Anesthesia Plan Comments:         Anesthesia Quick Evaluation

## 2020-09-10 ENCOUNTER — Encounter: Payer: Self-pay | Admitting: Student in an Organized Health Care Education/Training Program

## 2020-09-10 DIAGNOSIS — F12188 Cannabis abuse with other cannabis-induced disorder: Secondary | ICD-10-CM

## 2020-09-10 DIAGNOSIS — G8929 Other chronic pain: Secondary | ICD-10-CM

## 2020-09-10 DIAGNOSIS — R109 Unspecified abdominal pain: Secondary | ICD-10-CM

## 2020-09-10 LAB — CBC
HCT: 39 % (ref 36.0–46.0)
Hemoglobin: 12.7 g/dL (ref 12.0–15.0)
MCH: 32 pg (ref 26.0–34.0)
MCHC: 32.6 g/dL (ref 30.0–36.0)
MCV: 98.2 fL (ref 80.0–100.0)
Platelets: 180 10*3/uL (ref 150–400)
RBC: 3.97 MIL/uL (ref 3.87–5.11)
RDW: 13.2 % (ref 11.5–15.5)
WBC: 9.8 10*3/uL (ref 4.0–10.5)
nRBC: 0 % (ref 0.0–0.2)

## 2020-09-10 LAB — BASIC METABOLIC PANEL
Anion gap: 7 (ref 5–15)
BUN: 5 mg/dL — ABNORMAL LOW (ref 6–20)
CO2: 24 mmol/L (ref 22–32)
Calcium: 8.5 mg/dL — ABNORMAL LOW (ref 8.9–10.3)
Chloride: 108 mmol/L (ref 98–111)
Creatinine, Ser: 0.68 mg/dL (ref 0.44–1.00)
GFR, Estimated: >60 mL/min (ref >60)
Glucose, Bld: 88 mg/dL (ref 70–99)
Potassium: 3.4 mmol/L — ABNORMAL LOW (ref 3.5–5.1)
Sodium: 139 mmol/L (ref 135–145)

## 2020-09-10 LAB — MAGNESIUM: Magnesium: 1.9 mg/dL (ref 1.7–2.4)

## 2020-09-10 MED ORDER — LACTATED RINGERS IV BOLUS
500.0000 mL | Freq: Once | INTRAVENOUS | Status: AC
  Administered 2020-09-10: 12:00:00 500 mL via INTRAVENOUS

## 2020-09-10 MED ORDER — DIPHENHYDRAMINE HCL 50 MG/ML IJ SOLN
25.0000 mg | Freq: Once | INTRAMUSCULAR | Status: AC
  Administered 2020-09-10: 12:00:00 25 mg via INTRAVENOUS
  Filled 2020-09-10: qty 1

## 2020-09-10 MED ORDER — METOCLOPRAMIDE HCL 5 MG/ML IJ SOLN
5.0000 mg | Freq: Once | INTRAMUSCULAR | Status: AC
  Administered 2020-09-10: 12:00:00 5 mg via INTRAVENOUS
  Filled 2020-09-10: qty 2

## 2020-09-10 MED ORDER — PROMETHAZINE HCL 25 MG/ML IJ SOLN
12.5000 mg | Freq: Once | INTRAMUSCULAR | Status: AC
  Administered 2020-09-10: 12:00:00 12.5 mg via INTRAVENOUS

## 2020-09-10 MED ORDER — IPRATROPIUM-ALBUTEROL 0.5-2.5 (3) MG/3ML IN SOLN
3.0000 mL | Freq: Three times a day (TID) | RESPIRATORY_TRACT | Status: DC
  Administered 2020-09-10 – 2020-09-11 (×3): 3 mL via RESPIRATORY_TRACT
  Filled 2020-09-10 (×2): qty 3

## 2020-09-10 NOTE — Progress Notes (Signed)
Subjective: Patient slightly improved today.  No nausea and vomiting since yesterday morning.  Continues to have abdominal pain.  Tolerating clear liquid diet without any problems.  Wishes to try full liquids if possible.  Objective: Vital signs in last 24 hours: Temp:  [98.1 F (36.7 C)-98.5 F (36.9 C)] 98.1 F (36.7 C) (12/19 7902) Pulse Rate:  [70-93] 70 (12/19 0642) Resp:  [18] 18 (12/19 0642) BP: (114-128)/(63-68) 114/67 (12/19 0642) SpO2:  [96 %-98 %] 98 % (12/19 0745) Last BM Date: 09/08/20 General:   Alert and oriented, pleasant Head:  Normocephalic and atraumatic. Eyes:  No icterus, sclera clear. Conjuctiva pink.  Mouth:  Without lesions, mucosa pink and moist. .  Abdomen:  Bowel sounds present, soft, non-tender, non-distended. No HSM or hernias noted. No rebound or guarding. No masses appreciated  Msk:  Symmetrical without gross deformities. Normal posture. Pulses:  Normal pulses noted. Extremities:  Without clubbing or edema. Neurologic:  Alert and  oriented x4;  grossly normal neurologically. Skin:  Warm and dry, intact without significant lesions.  Cervical Nodes:  No significant cervical adenopathy. Psych:  Alert and cooperative. Normal mood and affect.  Intake/Output from previous day: 12/18 0701 - 12/19 0700 In: 4097 [P.O.:240; I.V.:1250] Out: -  Intake/Output this shift: No intake/output data recorded.  Lab Results: Recent Labs    09/08/20 1357 09/09/20 0425 09/10/20 0516  WBC 17.2* 17.7* 9.8  HGB 14.8 14.1 12.7  HCT 43.0 42.1 39.0  PLT 247 234 180   BMET Recent Labs    09/08/20 1357 09/09/20 0425 09/10/20 0630  NA 138 135 139  K 3.7 3.3* 3.4*  CL 106 105 108  CO2 21* 21* 24  GLUCOSE 149* 116* 88  BUN 6 6 5*  CREATININE 0.74 0.57 0.68  CALCIUM 9.3 8.9 8.5*   LFT Recent Labs    09/08/20 1357 09/09/20 0425  PROT 6.9 6.6  ALBUMIN 3.8 3.6  AST 17 12*  ALT 16 14  ALKPHOS 67 64  BILITOT 0.7 0.4   PT/INR Recent Labs     09/09/20 0425  LABPROT 13.2  INR 1.0   Hepatitis Panel No results for input(s): HEPBSAG, HCVAB, HEPAIGM, HEPBIGM in the last 72 hours.   Studies/Results: CT ABDOMEN PELVIS W CONTRAST  Result Date: 09/08/2020 CLINICAL DATA:  Acute abdominal pain EXAM: CT ABDOMEN AND PELVIS WITH CONTRAST TECHNIQUE: Multidetector CT imaging of the abdomen and pelvis was performed using the standard protocol following bolus administration of intravenous contrast. CONTRAST:  175mL OMNIPAQUE IOHEXOL 300 MG/ML  SOLN COMPARISON:  CT 05/18/2020, 01/18/2020 FINDINGS: Lower chest: Lung bases demonstrate no acute consolidation or effusion. Hepatobiliary: No focal liver abnormality is seen. Status post cholecystectomy. No biliary dilatation. Pancreas: Unremarkable. No pancreatic ductal dilatation or surrounding inflammatory changes. Spleen: Normal in size without focal abnormality. Adrenals/Urinary Tract: Adrenal glands are unremarkable. Kidneys are normal, without renal calculi, focal lesion, or hydronephrosis. Bladder is unremarkable. Stomach/Bowel: Stomach is within normal limits. Appendix appears normal. No evidence of bowel wall thickening, distention, or inflammatory changes. Collapsed appearance of the transverse, descending and rectosigmoid colon. Vascular/Lymphatic: No significant vascular findings are present. No enlarged abdominal or pelvic lymph nodes. Reproductive: Uterus and bilateral adnexa are unremarkable. Other: Trace free fluid in the pelvis.  No free air. Musculoskeletal: Degenerative changes. No acute or suspicious osseous abnormality IMPRESSION: 1. No CT evidence for acute intra-abdominal or pelvic abnormality. 2. Trace free fluid in the pelvis. Electronically Signed   By: Donavan Foil M.D.   On: 09/08/2020 21:06  Impression: *Abdominal pain *Intractable nausea/vomiting *Gastritis/duodenitis *Chronic marijuana use  Plan: Etiology of patient's symptoms likely multifactorial with cannabis  hyperemesis syndrome playing a large role as well as gastritis with erosions/duodenitis.  Discussed this in depth with patient today.  Counseled her on the importance of avoiding marijuana use going forward.  Continue on PPI twice daily for the next 8 weeks.  Biopsies pending to rule out H. pylori.  Avoid NSAIDs.  Advance diet to full liquids today.  Continue antiemetics as needed.  GI to continue to follow.  Elon Alas. Abbey Chatters, D.O. Gastroenterology and Hepatology [facility]   LOS: 2 days    09/10/2020, 12:33 PM

## 2020-09-10 NOTE — Progress Notes (Signed)
Ardith Dark notified of patient request for something for "break through pain"

## 2020-09-10 NOTE — Progress Notes (Signed)
PROGRESS NOTE  Debbie Santana FHL:[id number] DOB: 1991-06-23 DOA: 09/08/2020 PCP: Perlie Mayo, NP  Brief History:  29 year old female with a history of anxiety/depression, reflux esophagitis/erosive gastritis, tobacco abuse, marijuana use presenting with 3 to 4-day history of nausea, vomiting, and worsening epigastric pain.  Patient states that she began feeling nauseous and unwell on 09/04/2020.  This progressed to worsening epigastric pain and uncontrollable nausea and vomiting on 09/08/2020.  She was unable to keep down any fluids or pills.  The patient denies any recent travels or eating any uncooked foods.  She states that her only new medicine is Lyrica.  She denies any headache, chest pain, shortness of breath, hemoptysis, diarrhea, hematochezia, melena, although she has had some loose stools..  She has had bilious vomitus.  She continues to smoke marijuana 3 times per week.  She smokes tobacco 1 pack/day. Notably, the patient had a colonoscopy performed on 08/29/2020 which was unremarkable.  The pathology on her biopsies revealed no active inflammation in her colon.  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  In addition, the patient had an EGD on 05/22/2020 which showed erosive esophagitis and gastric erosions.  The biopsies that time showed mild chronic gastritis.  H. pylori was negative.  She was started on Protonix twice daily and Carafate.  She has since stopped taking Protonix, but she denies any NSAID use.  Notably, the patient had an admission from 05/19/2020 to 05/22/2020 with a similar presentation. In the emergency department, the patient continued to have dry heaves.  She was afebrile hemodynamically stable with oxygen saturation 97% room air.  BMP was essentially unremarkable with normal LFTs.  Lipase was 24.  WBC 17.2, hemoglobin 14.1, platelets 234,000.  CT of the abdomen and pelvis was unremarkable.  The patient was started on IV fluids and placed on bowel  rest.  Assessment/Plan: Intractable nausea/vomiting/abdominal pain -Likely due to reflux esophagitis and gastritis -Suspect there is a component of cannabis hyperemesis syndromeand psychiatric overlay -Concerned about underlying erosive gastropathy/reflux esophagitis -Continue PPI twice daily -Continue IV fluids -continueZofran around-the-clock -Lipase 24 -05/18/2020 CT abdomen--essentially negative -09/08/20 CT abd/pelvis--unremarkable -UA negative for pyuria -8/30 EGD--Erosive reflux esophagitis. Gastric erosions?status post biopsy; normal duodenal bulb andsecond portion of the duodenum; symptoms exacerbated by recent NSAID use -08/29/20 Colonoscopy--no inflammation entire colon -09/09/20 EGD--moderately severe reflux esophagitis; diffuse moderate gastritis  -patient want to remain on clears  Dehydration -ContinuedIV fluids  Leukemoid reaction -Suspect stress demargination -Do not plan to start antibiotics presently -Monitor patient clinically--afebrile and hemodynamically stable -personally reviewed CXR--no infiltrates or edema -UA--neg for pyuria -improved  Acute bronchitis -Start duo nebs -Start Pulmicort -viral respiratory panel--positive rhinovirus/enterovirus  Anxiety/depression -Continue BuSpar, Atarax, Paxil, trazodone  Tobacco abuse -NicoDerm patch deferred -Cessation discussed  Morbid obesity -BMI 34.97 -Lifestyle modification  Hypokalemia -Repleted -Magnesium 1.9 -Addedpotassium to IV fluids  Chronic Pain syndrome -she follow pain management--Dr. Holley Raring -continue Lyrica if tolerates po -scheduled for "back injection" 12/20    Status is: Inpatient  Remains inpatient appropriate because:IV treatments appropriate due to intensity of illness or inability to take PO   Dispo: The patient is from: Home  Anticipated d/c is to: Home  Anticipated d/c date is: 2 days  Patient currently is not  medically stable to d/c.        Family Communication:   Mother update at bedside 12/18  Consultants:  none  Code Status:  FULL   DVT Prophylaxis:   [place] Lovenox  Procedures: As Listed in Progress Note Above  Antibiotics: None     Subjective: Patient complains of migraine headache only minimally relieved with morphine.  Denies vomiting since yesterday.  States abd pain is a little better.  Denies diarrhea, hematochezia, melena  Objective: Vitals:   09/09/20 2012 09/09/20 2203 09/10/20 0642 09/10/20 0745  BP:  114/63 114/67   Pulse:  84 70   Resp:  18 18   Temp:  98.5 F (36.9 C) 98.1 F (36.7 C)   TempSrc:  Oral Oral   SpO2: 97% 98% 96% 98%  Weight:      Height:        Intake/Output Summary (Last 24 hours) at 09/10/2020 1118 Last data filed at 09/09/2020 1500 Gross per 24 hour  Intake 1240 ml  Output --  Net 1240 ml   Weight change:  Exam:   General:  Pt is alert, follows commands appropriately, not in acute distress  HEENT: No icterus, No thrush, No neck mass, [place]/AT  Cardiovascular: RRR, S1/S2, no rubs, no gallops  Respiratory: bibasilar rales. No wheeze  Abdomen: Soft/+BS, non tender, non distended, no guarding  Extremities: No edema, No lymphangitis, No petechiae, No rashes, no synovitis   Data Reviewed: I have personally reviewed following labs and imaging studies Basic Metabolic Panel: Recent Labs  Lab 09/08/20 1357 09/09/20 0425 09/10/20 0630  NA 138 135 139  K 3.7 3.3* 3.4*  CL 106 105 108  CO2 21* 21* 24  GLUCOSE 149* 116* 88  BUN 6 6 5*  CREATININE 0.74 0.57 0.68  CALCIUM 9.3 8.9 8.5*  MG  --  1.9 1.9  PHOS  --  3.5  --    Liver Function Tests: Recent Labs  Lab 09/08/20 1357 09/09/20 0425  AST 17 12*  ALT 16 14  ALKPHOS 67 64  BILITOT 0.7 0.4  PROT 6.9 6.6  ALBUMIN 3.8 3.6   Recent Labs  Lab 09/08/20 1357  LIPASE 24   No results for input(s): AMMONIA in the last 168 hours. Coagulation  Profile: Recent Labs  Lab 09/09/20 0425  INR 1.0   CBC: Recent Labs  Lab 09/08/20 1357 09/09/20 0425 09/10/20 0516  WBC 17.2* 17.7* 9.8  HGB 14.8 14.1 12.7  HCT 43.0 42.1 39.0  MCV 92.7 93.8 98.2  PLT 247 234 180   Cardiac Enzymes: No results for input(s): CKTOTAL, CKMB, CKMBINDEX, TROPONINI in the last 168 hours. BNP: Invalid input(s): POCBNP CBG: Recent Labs  Lab 09/08/20 1403  GLUCAP 171*   HbA1C: No results for input(s): HGBA1C in the last 72 hours. Urine analysis:    Component Value Date/Time   COLORURINE YELLOW 09/08/2020 1615   APPEARANCEUR HAZY (A) 09/08/2020 1615   APPEARANCEUR Clear 07/18/2020 1630   LABSPEC 1.014 09/08/2020 1615   PHURINE 9.0 (H) 09/08/2020 1615   GLUCOSEU NEGATIVE 09/08/2020 1615   HGBUR NEGATIVE 09/08/2020 1615   BILIRUBINUR NEGATIVE 09/08/2020 1615   BILIRUBINUR Negative 07/18/2020 1630   KETONESUR 20 (A) 09/08/2020 1615   PROTEINUR NEGATIVE 09/08/2020 1615   UROBILINOGEN 0.2 05/14/2015 0028   NITRITE NEGATIVE 09/08/2020 1615   LEUKOCYTESUR NEGATIVE 09/08/2020 1615   Sepsis Labs: @LABRCNTIP (procalcitonin:4,lacticidven:4) ) Recent Results (from the past 240 hour(s))  Resp Panel by RT-PCR (Flu A&B, Covid) Nasopharyngeal Swab     Status: None   Collection Time: 09/08/20 10:30 PM   Specimen: Nasopharyngeal Swab; Nasopharyngeal(NP) swabs in vial transport medium  Result Value Ref Range Status   SARS Coronavirus 2 by RT PCR NEGATIVE  NEGATIVE Final    Comment: (NOTE) SARS-CoV-2 target nucleic acids are NOT DETECTED.  The SARS-CoV-2 RNA is generally detectable in upper respiratory specimens during the acute phase of infection. The lowest concentration of SARS-CoV-2 viral copies this assay can detect is 138 copies/mL. A negative result does not preclude SARS-Cov-2 infection and should not be used as the sole basis for treatment or other patient management decisions. A negative result may occur with  improper specimen  collection/handling, submission of specimen other than nasopharyngeal swab, presence of viral mutation(s) within the areas targeted by this assay, and inadequate number of viral copies(<138 copies/mL). A negative result must be combined with clinical observations, patient history, and epidemiological information. The expected result is Negative.  Fact Sheet for Patients:  EntrepreneurPulse.com.au  Fact Sheet for Healthcare Providers:  IncredibleEmployment.be  This test is no t yet approved or cleared by the [place] FDA and  has been authorized for detection and/or diagnosis of SARS-CoV-2 by FDA under an Emergency Use Authorization (EUA). This EUA will remain  in effect (meaning this test can be used) for the duration of the COVID-19 declaration under Section 564(b)(1) of the Act, 21 U.S.C.section 360bbb-3(b)(1), unless the authorization is terminated  or revoked sooner.       Influenza A by PCR NEGATIVE NEGATIVE Final   Influenza B by PCR NEGATIVE NEGATIVE Final    Comment: (NOTE) The Xpert Xpress SARS-CoV-2/FLU/RSV plus assay is intended as an aid in the diagnosis of influenza from Nasopharyngeal swab specimens and should not be used as a sole basis for treatment. Nasal washings and aspirates are unacceptable for Xpert Xpress SARS-CoV-2/FLU/RSV testing.  Fact Sheet for Patients: EntrepreneurPulse.com.au  Fact Sheet for Healthcare Providers: IncredibleEmployment.be  This test is not yet approved or cleared by the [place] FDA and has been authorized for detection and/or diagnosis of SARS-CoV-2 by FDA under an Emergency Use Authorization (EUA). This EUA will remain in effect (meaning this test can be used) for the duration of the COVID-19 declaration under Section 564(b)(1) of the Act, 21 U.S.C. section 360bbb-3(b)(1), unless the authorization is terminated or revoked.  Performed at [facility], [address], [place], [place] [address]   Respiratory Panel by PCR     Status: Abnormal   Collection Time: 09/09/20 11:00 AM   Specimen: Nasopharyngeal Swab; Respiratory  Result Value Ref Range Status   Adenovirus NOT DETECTED NOT DETECTED Final   Coronavirus 229E NOT DETECTED NOT DETECTED Final    Comment: (NOTE) The Coronavirus on the Respiratory Panel, DOES NOT test for the novel  Coronavirus (2019 nCoV)    Coronavirus HKU1 NOT DETECTED NOT DETECTED Final   Coronavirus NL63 NOT DETECTED NOT DETECTED Final   Coronavirus OC43 NOT DETECTED NOT DETECTED Final   Metapneumovirus NOT DETECTED NOT DETECTED Final   Rhinovirus / Enterovirus DETECTED (A) NOT DETECTED Final   Influenza A NOT DETECTED NOT DETECTED Final   Influenza B NOT DETECTED NOT DETECTED Final   Parainfluenza Virus 1 NOT DETECTED NOT DETECTED Final   Parainfluenza Virus 2 NOT DETECTED NOT DETECTED Final   Parainfluenza Virus 3 NOT DETECTED NOT DETECTED Final   Parainfluenza Virus 4 NOT DETECTED NOT DETECTED Final   Respiratory Syncytial Virus NOT DETECTED NOT DETECTED Final   Bordetella pertussis NOT DETECTED NOT DETECTED Final   Bordetella Parapertussis NOT DETECTED NOT DETECTED Final   Chlamydophila pneumoniae NOT DETECTED NOT DETECTED Final   Mycoplasma pneumoniae NOT DETECTED NOT DETECTED Final    Comment: Performed at [facility]  [facility], [place] [address] [place], [place] [address]     Scheduled Meds: . budesonide (PULMICORT) nebulizer solution  0.5 mg Nebulization BID  . busPIRone  15 mg Oral TID  . capsaicin   Topical BID  . enoxaparin (LOVENOX) injection  40 mg Subcutaneous Q24H  . hydrOXYzine  25 mg Oral TID  . ipratropium-albuterol  3 mL Nebulization Q8H  . nicotine  21 mg Transdermal Daily  . ondansetron (ZOFRAN) IV  4 mg Intravenous Q6H  . pantoprazole (PROTONIX) IV  40 mg Intravenous Q12H  . PARoxetine  40 mg Oral Daily  . pregabalin  50 mg Oral BID   Continuous Infusions: . 0.9 % NaCl  with KCl 20 mEq / L 100 mL/hr (09/09/20 2233)    Procedures/Studies: CT ABDOMEN PELVIS W CONTRAST  Result Date: 09/08/2020 CLINICAL DATA:  Acute abdominal pain EXAM: CT ABDOMEN AND PELVIS WITH CONTRAST TECHNIQUE: Multidetector CT imaging of the abdomen and pelvis was performed using the standard protocol following bolus administration of intravenous contrast. CONTRAST:  135mL OMNIPAQUE IOHEXOL 300 MG/ML  SOLN COMPARISON:  CT 05/18/2020, 01/18/2020 FINDINGS: Lower chest: Lung bases demonstrate no acute consolidation or effusion. Hepatobiliary: No focal liver abnormality is seen. Status post cholecystectomy. No biliary dilatation. Pancreas: Unremarkable. No pancreatic ductal dilatation or surrounding inflammatory changes. Spleen: Normal in size without focal abnormality. Adrenals/Urinary Tract: Adrenal glands are unremarkable. Kidneys are normal, without renal calculi, focal lesion, or hydronephrosis. Bladder is unremarkable. Stomach/Bowel: Stomach is within normal limits. Appendix appears normal. No evidence of bowel wall thickening, distention, or inflammatory changes. Collapsed appearance of the transverse, descending and rectosigmoid colon. Vascular/Lymphatic: No significant vascular findings are present. No enlarged abdominal or pelvic lymph nodes. Reproductive: Uterus and bilateral adnexa are unremarkable. Other: Trace free fluid in the pelvis.  No free air. Musculoskeletal: Degenerative changes. No acute or suspicious osseous abnormality IMPRESSION: 1. No CT evidence for acute intra-abdominal or pelvic abnormality. 2. Trace free fluid in the pelvis. Electronically Signed   By: Donavan Foil M.D.   On: 09/08/2020 21:06    Orson Eva, DO  [facility]  If 7PM-7AM, please contact night-coverage [email or web address] Password [email or web address] 09/10/2020, 11:18 AM   LOS: 2 days

## 2020-09-11 ENCOUNTER — Encounter (HOSPITAL_COMMUNITY): Payer: Self-pay | Admitting: Internal Medicine

## 2020-09-11 ENCOUNTER — Telehealth: Payer: Self-pay | Admitting: Gastroenterology

## 2020-09-11 ENCOUNTER — Ambulatory Visit
Payer: Medicaid Other | Attending: Student in an Organized Health Care Education/Training Program | Admitting: Student in an Organized Health Care Education/Training Program

## 2020-09-11 LAB — MAGNESIUM: Magnesium: 1.9 mg/dL (ref 1.7–2.4)

## 2020-09-11 LAB — BASIC METABOLIC PANEL
Anion gap: 7 (ref 5–15)
BUN: <5 mg/dL — ABNORMAL LOW (ref 6–20)
CO2: 24 mmol/L (ref 22–32)
Calcium: 8.6 mg/dL — ABNORMAL LOW (ref 8.9–10.3)
Chloride: 107 mmol/L (ref 98–111)
Creatinine, Ser: 0.75 mg/dL (ref 0.44–1.00)
GFR, Estimated: >60 mL/min (ref >60)
Glucose, Bld: 91 mg/dL (ref 70–99)
Potassium: 3.5 mmol/L (ref 3.5–5.1)
Sodium: 138 mmol/L (ref 135–145)

## 2020-09-11 MED ORDER — OXYCODONE HCL 5 MG PO TABS: 5.0000 mg | ORAL_TABLET | ORAL | 0 refills | Status: DC | PRN

## 2020-09-11 MED ORDER — ALBUTEROL SULFATE HFA 108 (90 BASE) MCG/ACT IN AERS: 2.0000 | INHALATION_SPRAY | Freq: Four times a day (QID) | RESPIRATORY_TRACT | 0 refills | Status: DC | PRN

## 2020-09-11 MED ORDER — PANTOPRAZOLE SODIUM 40 MG PO TBEC: 40.0000 mg | DELAYED_RELEASE_TABLET | Freq: Two times a day (BID) | ORAL | 1 refills | Status: DC

## 2020-09-11 MED ORDER — ALBUTEROL SULFATE (2.5 MG/3ML) 0.083% IN NEBU: 3.0000 mL | INHALATION_SOLUTION | Freq: Four times a day (QID) | RESPIRATORY_TRACT | Status: DC | PRN

## 2020-09-11 NOTE — Telephone Encounter (Signed)
Debbie Santana, patient needs hospital follow-up. Thanks!

## 2020-09-11 NOTE — TOC Initial Note (Signed)
Transition of Care [facility]) - Initial/Assessment Note    Patient Details  Name: Debbie Santana MRN: [id number] Date of Birth: 1991/05/27  [facility] of [facility]) CM/SW Contact:    Natasha Bence, LCSW Phone Number: 09/11/2020, 1:48 PM  Clinical Narrative:                 Patient is a 29 year old female admitted for abdominal pain. CSW identified patient's high readmission risk score. CSW conducted readmission prevention assesment. CSW LVM with PCP office for follow up appt due to not being able to reach front desk staff. CSW also preformed initial assessment. No DME or HH needs identified. Patient lives with her mother who is able to provide transportation. Patient also does not have difficulty affording or accessing her medications. TOC to follow.  Expected Discharge Plan: Home/Self Care Barriers to Discharge: Continued Medical Work up   Patient Goals and CMS Choice Patient states their goals for this hospitalization and ongoing recovery are:: Return home w/ self care CMS Medicare.gov Compare Post Acute Care list provided to:: Patient Choice offered to / list presented to : [place]  Expected Discharge Plan and Services Expected Discharge Plan: Home/Self Care In-house Referral: NA Discharge Planning Services: NA Post Acute Care Choice: NA Living arrangements for the past 2 months: Single Family Home                 DME Arranged: N/A DME Agency: NA       HH Arranged: NA HH Agency: NA        Prior Living Arrangements/Services Living arrangements for the past 2 months: Single Family Home Lives with:: Self,Parents,Relatives Patient language and need for interpreter reviewed:: No Do you feel safe going back to the place where you live?: Yes      Need for Family Participation in Patient Care: Yes (Comment) Care giver support system in place?: Yes (comment)   Criminal Activity/Legal Involvement Pertinent to Current Situation/Hospitalization: No - Comment as  needed  Activities of Daily Living Home Assistive Devices/Equipment: None ADL Screening (condition at time of admission) Patient's cognitive ability adequate to safely complete daily activities?: Yes Is the patient deaf or have difficulty hearing?: No Does the patient have difficulty seeing, even when wearing glasses/contacts?: No Does the patient have difficulty concentrating, remembering, or making decisions?: No Patient able to express need for assistance with ADLs?: Yes Does the patient have difficulty dressing or bathing?: No Independently performs ADLs?: Yes (appropriate for developmental age) Does the patient have difficulty walking or climbing stairs?: No Weakness of Legs: None Weakness of Arms/Hands: None  Permission Sought/Granted Permission sought to share information with : Family Supports Permission granted to share information with : Yes, Verbal Permission Granted  Share Information with NAME: Lesbia Ottaway  Permission granted to share info w AGENCY: PCP Bonna Gains  Permission granted to share info w Relationship: Mother  Permission granted to share info w Contact Information: [phone]  Emotional Assessment       Orientation: : Oriented to Self,Oriented to Situation,Oriented to Place,Oriented to  Time Alcohol / Substance Use: Not Applicable Psych Involvement: No (comment)  Admission diagnosis:  Chronic abdominal pain [R10.9, G89.29] Intractable nausea and vomiting [R11.2] Abdominal pain [R10.9] Patient Active Problem List   Diagnosis Date Noted  . Cannabis hyperemesis syndrome concurrent with and due to cannabis abuse ([place]) 09/09/2020  . Chronic abdominal pain   . Abdominal pain 09/08/2020  . Chronic radicular lumbar pain 08/23/2020  . Chronic pain syndrome 08/23/2020  .  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 Post laminectomy syndrome 08/23/2020  . Frequent headaches 08/10/2020  . Nexplanon in place 08/10/2020  . Irregular  intermenstrual bleeding 08/10/2020  . Obesity (BMI 30-39.9) 05/20/2020  . 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 Hypokalemia 05/19/2020  . Panic attack 05/18/2020  . Epigastric pain 05/18/2020  . Agitated 05/18/2020  . Gastroesophageal reflux disease 05/10/2020  . Alternating constipation and diarrhea 05/10/2020  . Wheezing on inspiration 02/22/2020  . Sleep disturbance 02/22/2020  . Anxiety and depression 02/19/2019  . Intractable nausea and vomiting 12/07/2018  . Low back pain 12/07/2018  . Chronic diarrhea 12/07/2018  . Dehydration 12/07/2018  . Condyloma 04/15/2018  . History of abnormal cervical Pap smear 11/04/2017  . Cigarette smoker 04/26/2017  . Reflux esophagitis 04/17/2017  . Tobacco abuse 04/17/2017  . Peptic ulcer disease 04/13/2017  . 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 Leukocytosis 04/11/2017  . Hyperglycemia 04/11/2017  . Anxiety 04/11/2017   PCP:  Perlie Mayo, NP Pharmacy:   [facility] [address] [place] [address] [place] [address] [place] [address] Phone: [phone] Fax: [phone]     Social Determinants of Health (SDOH) Interventions    Readmission Risk Interventions Readmission Risk Prevention Plan 09/11/2020  Transportation Screening Complete  PCP or Specialist Appt within 3-5 Days Not Complete  Not Complete comments LVM for appt. TOC to follow  [place] or Home Care Consult Complete  Social Work Consult for [place] Planning/Counseling Complete  Palliative Care Screening Complete  Medication Review Press photographer) Complete  Some recent data might be hidden

## 2020-09-11 NOTE — Discharge Instructions (Signed)
Your lab tests and CT imaging today are reassuring with no emergent condition found on today's evaluation.  You have been prescribed some medications to help you with your symptoms.  I have also printed out some information about cannabinoid hyperemesis syndrome.  This may not be the reason for your chronic abdominal pain and nausea and vomiting, however I wanted to give you this information in the event you feel this could be related to your symptoms.  Plan to see your doctor for recheck if not improving over the next week.

## 2020-09-11 NOTE — Progress Notes (Signed)
    Subjective: Abdominal pain improved. No N/V. Received morphine at 0925 but requesting additional dose for "breakthrough" pain this morning. Declined viscous lidocaine, stating she has had this before without improvement.   Objective: Vital signs in last 24 hours: Temp:  [97.9 F (36.6 C)-98.8 F (37.1 C)] 97.9 F (36.6 C) (12/20 0952) Pulse Rate:  [62-87] 87 (12/20 0952) Resp:  [18] 18 (12/20 0952) BP: (106-129)/(70-89) 129/89 (12/20 0952) SpO2:  [97 %-100 %] 100 % (12/20 0952) Last BM Date: 09/08/20 General:   Alert and oriented, pleasant Head:  Normocephalic and atraumatic. Abdomen:  Bowel sounds present, soft, non-tender, non-distended. No HSM or hernias noted. No rebound or guarding. No masses appreciated  Neurologic:  Alert and  oriented x4 Psych:  Alert and cooperative. Normal mood and affect.  Intake/Output from previous day: 12/19 0701 - 12/20 0700 In: 3408.1 [P.O.:840; I.V.:2000; IV Piggyback:568.1] Out: -  Intake/Output this shift: Total I/O In: -  Out: 350 [Urine:350]  Lab Results: Recent Labs    09/08/20 1357 09/09/20 0425 09/10/20 0516  WBC 17.2* 17.7* 9.8  HGB 14.8 14.1 12.7  HCT 43.0 42.1 39.0  PLT 247 234 180   BMET Recent Labs    09/08/20 1357 09/09/20 0425 09/10/20 0630  NA 138 135 139  K 3.7 3.3* 3.4*  CL 106 105 108  CO2 21* 21* 24  GLUCOSE 149* 116* 88  BUN 6 6 5*  CREATININE 0.74 0.57 0.68  CALCIUM 9.3 8.9 8.5*   LFT Recent Labs    09/08/20 1357 09/09/20 0425  PROT 6.9 6.6  ALBUMIN 3.8 3.6  AST 17 12*  ALT 16 14  ALKPHOS 67 14  BILITOT 0.7 0.4   PT/INR Recent Labs    09/09/20 0425  LABPROT 13.2  INR 1.0    Assessment: 29 year old female presenting this admission with abdominal pain, N/V. EGD this admission with moderately severe reflux esophagitis, gastritis s/p biopsy. Symptoms multifactorial with cannabis hyperemesis syndrome playing significant role and gastritis/duodenitis. CT on file unrevealing. Gallbladder  absent. Lipase normal.  Clinically, she has noted improvement since admission. Abdominal pain lessened since admission. Requesting additional dose of narcotic now but just received IV Morphine an hour ago. I offered viscous lidocaine to take as needed, but she declined. No acute findings on exam.   Agree with advancement to soft diet.     Plan: PPI BID for 8 weeks, then resume once daily Avoid NSAIDs Avoid marijuana Follow-up on pending pathology Soft diet Anticipate discharge soon Will arrange outpatient follow-up   Annitta Needs, PhD, ANP-BC [facility]     LOS: 3 days    09/11/2020, 9:54 AM

## 2020-09-11 NOTE — Discharge Summary (Signed)
Physician Discharge Summary  Debbie Santana YDX:[id number] DOB: Apr 21, 1991 DOA: 09/08/2020  PCP: Perlie Mayo, NP  Admit date: 09/08/2020 Discharge date: 09/11/2020  Admitted From: Home Disposition:  Home   Recommendations for Outpatient Follow-up:  1. Follow up with PCP in 1-2 weeks 2. Please obtain BMP/CBC in one week     Discharge Condition: Stable CODE STATUS: FULL Diet recommendation: soft diet   Brief/Interim Summary: 29 year old female with a history of anxiety/depression, reflux esophagitis/erosive gastritis, tobacco abuse, marijuana use presenting with3 to 4-day history of nausea, vomiting, and worsening epigastric pain. Patient states that she began feeling nauseous and unwell on 09/04/2020. This progressed to worsening epigastric pain and uncontrollable nausea and vomiting on 09/08/2020. She was unable to keep down any fluids or pills. The patient denies any recent travels or eating any uncooked foods. She states that her only new medicine is Lyrica. She denies any headache, chest pain, shortness of breath, hemoptysis, diarrhea, hematochezia, melena, although she has had some loose stools.. She has had bilious vomitus. She continues to smoke marijuana 3 times per week. She smokes tobacco 1 pack/day. Notably, the patient had a colonoscopy performed on 08/29/2020 which was unremarkable. The pathology on her biopsies revealed no active inflammation in her colon. 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 The biopsies that time showed mild chronic gastritis. H. pylori was negative. She was started on Protonix twice daily and Carafate. She has since stopped taking Protonix, but she denies any NSAID use. Notably, the patient had an admission from 05/19/2020 to 05/22/2020 with a similar presentation. In the emergency department, the patient continued to have dry  heaves. She was afebrile hemodynamically stable with oxygen saturation 97% room air. BMP was essentially unremarkable with normal LFTs. Lipase was 24. WBC 17.2, hemoglobin 14.1, platelets 234,000. CT of the abdomen and pelvis was unremarkable. The patient was started on IV fluids and placed on bowel rest.   Discharge Diagnoses:  Intractable nausea/vomiting/abdominal pain -due to reflux esophagitis and gastritis -significant component of cannabis hyperemesis syndromeand psychiatric overlay -Concerned about underlying erosive gastropathy/reflux esophagitis -Continue PPI twice dailyx 8 weeks, then daily -Continue IV fluids -continueZofran around-the-clock during hospitalization -Lipase 24 -05/18/2020 CT abdomen--essentially negative -09/08/20 CT abd/pelvis--unremarkable -UA negative for pyuria -8/30 EGD--Erosive reflux esophagitis. Gastric erosions?status post biopsy; normal duodenal bulb andsecond portion of the duodenum; symptoms exacerbated by recent NSAID use -08/29/20 Colonoscopy--no inflammation entire colon -09/09/20 EGD--moderately severe reflux esophagitis; diffuse moderate gastritis  -patient want to remain on clears>>full liquids>>soft diet which patient tolerated  Dehydration -ContinuedIV fluids  Leukemoid reaction -Suspect stress demargination -Do not plan to start antibiotics presently -Monitor patient clinically--afebrile and hemodynamically stable -personally reviewed CXR--no infiltrates or edema -UA--neg for pyuria -improved  Acute bronchitis -Start duo nebs during hospitalization -Start Pulmicort during hospitalization -viral respiratory panel--positive rhinovirus/enterovirus -stable on RA -albuterol HFA  Anxiety/depression -Continue BuSpar, Atarax, Paxil, trazodone  Tobacco abuse -NicoDerm patch deferred -Cessation discussed  Morbid obesity -BMI 34.97 -Lifestyle modification  Hypokalemia -Repleted -Magnesium 1.9 -Addedpotassium to  IV fluids  Chronic Pain syndrome -she follow pain management--Dr. Holley Raring -continue Lyrica if tolerates po -scheduled for "back injection" 12/20   Discharge Instructions   Allergies as of 09/11/2020      Reactions   Nicoderm [nicotine] Itching   Sharp pain at site Updated 09/10/2020- patient tolerated nicotine patch   Nsaids Other (See Comments)   History of ulcers  Medication List    STOP taking these medications   promethazine 25 MG tablet Commonly known as: PHENERGAN   Suprep Bowel Prep Kit 17.5-3.13-1.6 GM/177ML Soln Generic drug: Na Sulfate-K Sulfate-Mg Sulf     TAKE these medications   albuterol 108 (90 Base) MCG/ACT inhaler Commonly known as: VENTOLIN HFA Inhale 1-2 puffs into the lungs every 6 (six) hours as needed for wheezing or shortness of breath.   busPIRone 15 MG tablet Commonly known as: BUSPAR Take 1 tablet (15 mg total) by mouth 3 (three) times daily.   butalbital-acetaminophen-caffeine 50-325-40 MG tablet Commonly known as: FIORICET Take 1 tablet by mouth every 6 (six) hours as needed (migraine headaches).   diphenhydrAMINE 25 MG tablet Commonly known as: BENADRYL Take 25 mg by mouth every 6 (six) hours as needed (allergies.).   hydrOXYzine 25 MG tablet Commonly known as: ATARAX/VISTARIL Take 1 tablet (25 mg total) by mouth every 6 (six) hours.   megestrol 40 MG tablet Commonly known as: MEGACE Take 2 daily What changed:   how much to take  how to take this  when to take this   Nexplanon 68 MG Impl implant Generic drug: etonogestrel 1 each by Subdermal route once.   oxyCODONE 5 MG immediate release tablet Commonly known as: Oxy IR/ROXICODONE Take 1 tablet (5 mg total) by mouth every 4 (four) hours as needed for severe pain.   pantoprazole 40 MG tablet Commonly known as: PROTONIX Take 1 tablet (40 mg total) by mouth 2 (two) times daily before a meal.   PARoxetine 40 MG tablet Commonly known as: PAXIL Take 1 tablet (40  mg total) by mouth every morning.   pregabalin 50 MG capsule Commonly known as: Lyrica Take 1 capsule (50 mg total) by mouth at bedtime for 10 days, THEN 1 capsule (50 mg total) 2 (two) times daily. Start taking on: August 23, 2020   PROMETHAZINE-DM PO Take 1 tablet by mouth daily as needed.   sucralfate 1 g tablet Commonly known as: Carafate Take 1 tablet (1 g total) by mouth 3 (three) times daily before meals.   SUMAtriptan 25 MG tablet Commonly known as: IMITREX Take 25 mg by mouth every 2 (two) hours as needed for migraine. May repeat in 2 hours if headache persists or recurs.   tiZANidine 4 MG tablet Commonly known as: Zanaflex Take 1 tablet (4 mg total) by mouth daily as needed for muscle spasms.   traZODone 50 MG tablet Commonly known as: DESYREL TAKE 1 TABLET BY MOUTH AT BEDTIME       Follow-up Information    Perlie Mayo, NP. Schedule an appointment as soon as possible for a visit in 3 days.   Specialty: Family Medicine Why: for a recheck of your symptoms if they persist Contact information: [place] [address] [phone]              Allergies  Allergen Reactions  . Nicoderm [Nicotine] Itching    Sharp pain at site  Updated 09/10/2020- patient tolerated nicotine patch  . Nsaids Other (See Comments)    History of ulcers    Consultations: GI   Procedures/Studies: CT ABDOMEN PELVIS W CONTRAST  Result Date: 09/08/2020 CLINICAL DATA:  Acute abdominal pain EXAM: CT ABDOMEN AND PELVIS WITH CONTRAST TECHNIQUE: Multidetector CT imaging of the abdomen and pelvis was performed using the standard protocol following bolus administration of intravenous contrast. CONTRAST:  165m OMNIPAQUE IOHEXOL 300 MG/ML  SOLN COMPARISON:  CT 05/18/2020, 01/18/2020 FINDINGS:  Lower chest: Lung bases demonstrate no acute consolidation or effusion. Hepatobiliary: No focal liver abnormality is seen. Status post cholecystectomy. No biliary dilatation.  Pancreas: Unremarkable. No pancreatic ductal dilatation or surrounding inflammatory changes. Spleen: Normal in size without focal abnormality. Adrenals/Urinary Tract: Adrenal glands are unremarkable. Kidneys are normal, without renal calculi, focal lesion, or hydronephrosis. Bladder is unremarkable. Stomach/Bowel: Stomach is within normal limits. Appendix appears normal. No evidence of bowel wall thickening, distention, or inflammatory changes. Collapsed appearance of the transverse, descending and rectosigmoid colon. Vascular/Lymphatic: No significant vascular findings are present. No enlarged abdominal or pelvic lymph nodes. Reproductive: Uterus and bilateral adnexa are unremarkable. Other: Trace free fluid in the pelvis.  No free air. Musculoskeletal: Degenerative changes. No acute or suspicious osseous abnormality IMPRESSION: 1. No CT evidence for acute intra-abdominal or pelvic abnormality. 2. Trace free fluid in the pelvis. Electronically Signed   By: Donavan Foil M.D.   On: 09/08/2020 21:06        Discharge Exam: Vitals:   09/11/20 1321 09/11/20 1447  BP: 112/62   Pulse: 82   Resp: 18   Temp: 98.6 F (37 C)   SpO2: 97% 99%   Vitals:   09/11/20 0757 09/11/20 0952 09/11/20 1321 09/11/20 1447  BP:  129/89 112/62   Pulse:  87 82   Resp:  18 18   Temp:  97.9 F (36.6 C) 98.6 F (37 C)   TempSrc:  Oral Oral   SpO2: 98% 100% 97% 99%  Weight:      Height:        General: Pt is alert, awake, not in acute distress Cardiovascular: RRR, S1/S2 +, no rubs, no gallops Respiratory: bibasilar rales. No wheeze Abdominal: Soft, NT, ND, bowel sounds + Extremities: no edema, no cyanosis   The results of significant diagnostics from this hospitalization (including imaging, microbiology, ancillary and laboratory) are listed below for reference.    Significant Diagnostic Studies: CT ABDOMEN PELVIS W CONTRAST  Result Date: 09/08/2020 CLINICAL DATA:  Acute abdominal pain EXAM: CT ABDOMEN  AND PELVIS WITH CONTRAST TECHNIQUE: Multidetector CT imaging of the abdomen and pelvis was performed using the standard protocol following bolus administration of intravenous contrast. CONTRAST:  121m OMNIPAQUE IOHEXOL 300 MG/ML  SOLN COMPARISON:  CT 05/18/2020, 01/18/2020 FINDINGS: Lower chest: Lung bases demonstrate no acute consolidation or effusion. Hepatobiliary: No focal liver abnormality is seen. Status post cholecystectomy. No biliary dilatation. Pancreas: Unremarkable. No pancreatic ductal dilatation or surrounding inflammatory changes. Spleen: Normal in size without focal abnormality. Adrenals/Urinary Tract: Adrenal glands are unremarkable. Kidneys are normal, without renal calculi, focal lesion, or hydronephrosis. Bladder is unremarkable. Stomach/Bowel: Stomach is within normal limits. Appendix appears normal. No evidence of bowel wall thickening, distention, or inflammatory changes. Collapsed appearance of the transverse, descending and rectosigmoid colon. Vascular/Lymphatic: No significant vascular findings are present. No enlarged abdominal or pelvic lymph nodes. Reproductive: Uterus and bilateral adnexa are unremarkable. Other: Trace free fluid in the pelvis.  No free air. Musculoskeletal: Degenerative changes. No acute or suspicious osseous abnormality IMPRESSION: 1. No CT evidence for acute intra-abdominal or pelvic abnormality. 2. Trace free fluid in the pelvis. Electronically Signed   By: KDonavan FoilM.D.   On: 09/08/2020 21:06     Microbiology: Recent Results (from the past 240 hour(s))  Resp Panel by RT-PCR (Flu A&B, Covid) Nasopharyngeal Swab     Status: None   Collection Time: 09/08/20 10:30 PM   Specimen: Nasopharyngeal Swab; Nasopharyngeal(NP) swabs in vial transport medium  Result Value Ref  Range Status   SARS Coronavirus 2 by RT PCR NEGATIVE NEGATIVE Final    Comment: (NOTE) SARS-CoV-2 target nucleic acids are NOT DETECTED.  The SARS-CoV-2 RNA is generally detectable in  upper respiratory specimens during the acute phase of infection. The lowest concentration of SARS-CoV-2 viral copies this assay can detect is 138 copies/mL. A negative result does not preclude SARS-Cov-2 infection and should not be used as the sole basis for treatment or other patient management decisions. A negative result may occur with  improper specimen collection/handling, submission of specimen other than nasopharyngeal swab, presence of viral mutation(s) within the areas targeted by this assay, and inadequate number of viral copies(<138 copies/mL). A negative result must be combined with clinical observations, patient history, and epidemiological information. The expected result is Negative.  Fact Sheet for Patients:  EntrepreneurPulse.com.au  Fact Sheet for Healthcare Providers:  IncredibleEmployment.be  This test is no t yet approved or cleared by the [place] FDA and  has been authorized for detection and/or diagnosis of SARS-CoV-2 by FDA under an Emergency Use Authorization (EUA). This EUA will remain  in effect (meaning this test can be used) for the duration of the COVID-19 declaration under Section 564(b)(1) of the Act, 21 U.S.C.section 360bbb-3(b)(1), unless the authorization is terminated  or revoked sooner.       Influenza A by PCR NEGATIVE NEGATIVE Final   Influenza B by PCR NEGATIVE NEGATIVE Final    Comment: (NOTE) The Xpert Xpress SARS-CoV-2/FLU/RSV plus assay is intended as an aid in the diagnosis of influenza from Nasopharyngeal swab specimens and should not be used as a sole basis for treatment. Nasal washings and aspirates are unacceptable for Xpert Xpress SARS-CoV-2/FLU/RSV testing.  Fact Sheet for Patients: EntrepreneurPulse.com.au  Fact Sheet for Healthcare Providers: IncredibleEmployment.be  This test is not yet approved or cleared by the [place] FDA and has been  authorized for detection and/or diagnosis of SARS-CoV-2 by FDA under an Emergency Use Authorization (EUA). This EUA will remain in effect (meaning this test can be used) for the duration of the COVID-19 declaration under Section 564(b)(1) of the Act, 21 U.S.C. section 360bbb-3(b)(1), unless the authorization is terminated or revoked.  Performed at [facility], [address], [place], [place] [address]   Respiratory Panel by PCR     Status: Abnormal   Collection Time: 09/09/20 11:00 AM   Specimen: Nasopharyngeal Swab; Respiratory  Result Value Ref Range Status   Adenovirus NOT DETECTED NOT DETECTED Final   Coronavirus 229E NOT DETECTED NOT DETECTED Final    Comment: (NOTE) The Coronavirus on the Respiratory Panel, DOES NOT test for the novel  Coronavirus (2019 nCoV)    Coronavirus HKU1 NOT DETECTED NOT DETECTED Final   Coronavirus NL63 NOT DETECTED NOT DETECTED Final   Coronavirus OC43 NOT DETECTED NOT DETECTED Final   Metapneumovirus NOT DETECTED NOT DETECTED Final   Rhinovirus / Enterovirus DETECTED (A) NOT DETECTED Final   Influenza A NOT DETECTED NOT DETECTED Final   Influenza B NOT DETECTED NOT DETECTED Final   Parainfluenza Virus 1 NOT DETECTED NOT DETECTED Final   Parainfluenza Virus 2 NOT DETECTED NOT DETECTED Final   Parainfluenza Virus 3 NOT DETECTED NOT DETECTED Final   Parainfluenza Virus 4 NOT DETECTED NOT DETECTED Final   Respiratory Syncytial Virus NOT DETECTED NOT DETECTED Final   Bordetella pertussis NOT DETECTED NOT DETECTED Final   Bordetella Parapertussis NOT DETECTED NOT DETECTED Final   Chlamydophila pneumoniae NOT DETECTED NOT DETECTED Final   Mycoplasma pneumoniae NOT  DETECTED NOT DETECTED Final    Comment: Performed at [facility], [place] [address] [place], [place] [address]     Labs: Basic Metabolic Panel: Recent Labs  Lab 09/08/20 1357 09/09/20 0425 09/10/20 0630 09/11/20 0625  NA 138 135 139 138  K 3.7 3.3* 3.4* 3.5  CL 106 105 108 107   CO2 21* 21* 24 24  GLUCOSE 149* 116* 88 91  BUN 6 6 5* <5*  CREATININE 0.74 0.57 0.68 0.75  CALCIUM 9.3 8.9 8.5* 8.6*  MG  --  1.9 1.9 1.9  PHOS  --  3.5  --   --    Liver Function Tests: Recent Labs  Lab 09/08/20 1357 09/09/20 0425  AST 17 12*  ALT 16 14  ALKPHOS 67 64  BILITOT 0.7 0.4  PROT 6.9 6.6  ALBUMIN 3.8 3.6   Recent Labs  Lab 09/08/20 1357  LIPASE 24   No results for input(s): AMMONIA in the last 168 hours. CBC: Recent Labs  Lab 09/08/20 1357 09/09/20 0425 09/10/20 0516  WBC 17.2* 17.7* 9.8  HGB 14.8 14.1 12.7  HCT 43.0 42.1 39.0  MCV 92.7 93.8 98.2  PLT 247 234 180   Cardiac Enzymes: No results for input(s): CKTOTAL, CKMB, CKMBINDEX, TROPONINI in the last 168 hours. BNP: Invalid input(s): POCBNP CBG: Recent Labs  Lab 09/08/20 1403  GLUCAP 171*    Time coordinating discharge:  36 minutes  Signed:  Orson Eva, DO [facility] Pager: [phone] 09/11/2020, 3:03 PM

## 2020-09-12 ENCOUNTER — Telehealth: Payer: Self-pay

## 2020-09-12 ENCOUNTER — Other Ambulatory Visit: Payer: Self-pay

## 2020-09-12 LAB — SURGICAL PATHOLOGY

## 2020-09-12 NOTE — Telephone Encounter (Signed)
Forwarded to [employer]

## 2020-09-12 NOTE — Telephone Encounter (Signed)
Transition Care Management Follow-up Telephone Call  Date of discharge and from where: 09/11/20 [facility]  How have you been since you were released from the hospital? Pt has been doing ok  Any questions or concerns? No  Items Reviewed:  Did the pt receive and understand the discharge instructions provided? Yes   Medications obtained and verified? Yes   Other? No   Any new allergies since your discharge? No   Dietary orders reviewed? Yes  Do you have support at home? Yes   Functional Questionnaire: (I = Independent and D = Dependent) ADLs: i  Bathing/Dressing- i  Meal Prep- i  Eating- i  Maintaining continence- i  Transferring/Ambulation- i  Managing Meds- i  Follow up appointments reviewed:   PCP Hospital f/u appt confirmed? Yes  Scheduled to see Jarrett Soho on 09/14/20 @ [facility] f/u appt confirmed? Yes  Scheduled to see GI on 10/31/19   Are transportation arrangements needed? No   If their condition worsens, is the pt aware to call PCP or go to the Emergency Dept.? Yes  Was the patient provided with contact information for the PCP's office or ED? Yes  Was to pt encouraged to call back with questions or concerns? Yes

## 2020-09-12 NOTE — Telephone Encounter (Signed)
Transition Care Management Unsuccessful Follow-up Telephone Call ° °Date of discharge and from where:  09/11/2020 from [facility] ° °Attempts:  1st Attempt ° °Reason for unsuccessful TCM follow-up call:  Unable to leave message ° ° ° ° °

## 2020-09-13 NOTE — Telephone Encounter (Signed)
Transition of Care call completed by [facility]. Patient has appointment scheduled to see Cherly Beach, NP on 09/14/2020 at 10am

## 2020-09-14 ENCOUNTER — Encounter: Payer: Self-pay | Admitting: Family Medicine

## 2020-09-14 ENCOUNTER — Other Ambulatory Visit: Payer: Self-pay

## 2020-09-14 ENCOUNTER — Telehealth: Payer: Self-pay | Admitting: Student in an Organized Health Care Education/Training Program

## 2020-09-14 ENCOUNTER — Ambulatory Visit (INDEPENDENT_AMBULATORY_CARE_PROVIDER_SITE_OTHER): Payer: Medicaid Other | Admitting: Family Medicine

## 2020-09-14 ENCOUNTER — Encounter: Payer: Self-pay | Admitting: Student in an Organized Health Care Education/Training Program

## 2020-09-14 VITALS — BP 122/74 | HR 102 | Temp 98.6°F | Ht 68.0 in | Wt 232.0 lb

## 2020-09-14 DIAGNOSIS — K2100 Gastro-esophageal reflux disease with esophagitis, without bleeding: Secondary | ICD-10-CM | POA: Diagnosis not present

## 2020-09-14 DIAGNOSIS — M5416 Radiculopathy, lumbar region: Secondary | ICD-10-CM

## 2020-09-14 DIAGNOSIS — Z7689 Persons encountering health services in other specified circumstances: Secondary | ICD-10-CM | POA: Insufficient documentation

## 2020-09-14 DIAGNOSIS — E669 Obesity, unspecified: Secondary | ICD-10-CM

## 2020-09-14 DIAGNOSIS — K2960 Other gastritis without bleeding: Secondary | ICD-10-CM | POA: Diagnosis not present

## 2020-09-14 DIAGNOSIS — K529 Noninfective gastroenteritis and colitis, unspecified: Secondary | ICD-10-CM | POA: Diagnosis not present

## 2020-09-14 DIAGNOSIS — G8929 Other chronic pain: Secondary | ICD-10-CM | POA: Diagnosis not present

## 2020-09-14 DIAGNOSIS — R062 Wheezing: Secondary | ICD-10-CM | POA: Diagnosis not present

## 2020-09-14 DIAGNOSIS — R112 Nausea with vomiting, unspecified: Secondary | ICD-10-CM | POA: Diagnosis not present

## 2020-09-14 NOTE — Telephone Encounter (Signed)
I did inform her of that. She still wants someone to call her

## 2020-09-14 NOTE — Assessment & Plan Note (Signed)
Resolved at this time- most likely related to marijuana use.

## 2020-09-14 NOTE — Telephone Encounter (Signed)
We DO NOT prescribe for this patient at this time. NO PHONE REFILLS.

## 2020-09-14 NOTE — Progress Notes (Signed)
Subjective:  Patient ID: Debbie Santana, female    DOB: [birth year]  Age: 29 y.o. MRN: [id number]  CC:  Chief Complaint  Patient presents with  . Hospitalization Follow-up    Vomiting from stomach ulcer, has improved but still having abdominal pain      HPI  HPI Debbie Santana is a 29 year old female with a history of anxiety/depression, reflux esophagitis/erosive gastritis, tobacco abuse, marijuana use.  She presented to the ED on 09/08/2020 with3 to 4-day history of nausea, vomiting, and worsening epigastric pain. She stated that she began feeling nauseous and unwell on 09/04/2020. It had progressed to worsening epigastric pain and uncontrollable nausea and vomiting on 09/08/2020. She was unable to keep down any fluids or pills. She denied any recent travels or eating any uncooked foods. She stated that her only new medicine is Lyrica. She denied any headache, chest pain, shortness of breath, hemoptysis, diarrhea, hematochezia, melena, although she reported some loose stools.. She had bilious vomitus. She does smoke marijuana 3 times per week. She smokes tobacco 1 pack/day.  Of note: her colonoscopy performed on 08/29/2020 was unremarkable. The pathology on her biopsies revealed no active inflammation in her colon. 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 The biopsies that time showed mild chronic gastritis. H. pylori was negative. She was started on Protonix twice daily and Carafate. She had stopped taking Protonix, and denied any NSAID use. She also was inpatient on  05/19/2020 to 05/22/2020 with a similar presentation.  In the emergency department, she continued to have dry heaves. She was afebrile hemodynamically stable with oxygen saturation 97% room air. BMP was essentially unremarkable with normal LFTs. Lipase was 24. WBC 17.2, hemoglobin 14.1, platelets 234,000. CT of  the abdomen and pelvis was unremarkable. She was started on IV fluids and placed on bowel rest.   Today in the office she is overall feeling a little better since discharge. She denies any headache, chest pain, shortness of breath, hemoptysis, diarrhea, hematochezia, melena. She is still in a lot of back pain, but will be following up with Dr Cherylann Ratel for injection that had to be cancelled due to inpatient status.  She is taking all her medications as reviewed in detail. She is still smoking.  Today patient denies signs and symptoms of COVID 19 infection including fever, chills, cough, shortness of breath, and headache. Past Medical, Surgical, Social History, Allergies, and Medications have been Reviewed.   Past Medical History:  Diagnosis Date  . Anxiety   . Bradycardia 04/17/2017  . Chest pain on breathing 02/22/2020  . Chronic back pain   . Depression   . 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 Gastric ulcer   . Genital warts   . History of gestational hypertension 05/21/2018   Dx intrapartum  . History of kidney stones   . Intertrigo 11/16/2019  . Intractable abdominal pain 04/11/2017  . Intractable vomiting 05/20/2020  . Irregular intermenstrual bleeding 02/19/2019  . Mental disorder   . Migraine without aura and without status migrainosus, not intractable 02/19/2019  . Nausea 05/10/2020  . Nausea & vomiting 12/07/2018  . Nexplanon in place 02/19/2019  . Obesity 04/17/2017  . Peptic ulcer disease 04/13/2017  . Postpartum hypertension 06/23/2018   requiring norvasc, resolved at pp visit  . Sciatica   . Vaginal Pap smear, abnormal     Current Meds  Medication Sig  . albuterol (VENTOLIN HFA) 108 (90  Base) MCG/ACT inhaler Inhale 1-2 puffs into the lungs every 6 (six) hours as needed for wheezing or shortness of breath.  Marland Kitchen albuterol (VENTOLIN HFA) 108 (90 Base) MCG/ACT inhaler Inhale 2 puffs into the lungs every 6 (six) hours as needed for wheezing or shortness of breath.  . busPIRone (BUSPAR)  15 MG tablet Take 1 tablet (15 mg total) by mouth 3 (three) times daily.  . butalbital-acetaminophen-caffeine (FIORICET) 50-325-40 MG tablet Take 1 tablet by mouth every 6 (six) hours as needed (migraine headaches).  . diphenhydrAMINE (BENADRYL) 25 MG tablet Take 25 mg by mouth every 6 (six) hours as needed (allergies.).  Marland Kitchen etonogestrel (NEXPLANON) 68 MG IMPL implant 1 each by Subdermal route once.  . hydrOXYzine (ATARAX/VISTARIL) 25 MG tablet Take 1 tablet (25 mg total) by mouth every 6 (six) hours.  . megestrol (MEGACE) 40 MG tablet Take 2 daily (Patient taking differently: Take 80 mg by mouth daily. Take 2 daily)  . oxyCODONE (OXY IR/ROXICODONE) 5 MG immediate release tablet Take 1 tablet (5 mg total) by mouth every 4 (four) hours as needed for severe pain.  . pantoprazole (PROTONIX) 40 MG tablet Take 1 tablet (40 mg total) by mouth 2 (two) times daily before a meal.  . PARoxetine (PAXIL) 40 MG tablet Take 1 tablet (40 mg total) by mouth every morning.  . pregabalin (LYRICA) 50 MG capsule Take 1 capsule (50 mg total) by mouth at bedtime for 10 days, THEN 1 capsule (50 mg total) 2 (two) times daily.  Marland Kitchen PROMETHAZINE-DM PO Take 1 tablet by mouth daily as needed.  . sucralfate (CARAFATE) 1 g tablet Take 1 tablet (1 g total) by mouth 3 (three) times daily before meals.  . SUMAtriptan (IMITREX) 25 MG tablet Take 25 mg by mouth every 2 (two) hours as needed for migraine. May repeat in 2 hours if headache persists or recurs.  Marland Kitchen tiZANidine (ZANAFLEX) 4 MG tablet Take 1 tablet (4 mg total) by mouth daily as needed for muscle spasms.  . traZODone (DESYREL) 50 MG tablet TAKE 1 TABLET BY MOUTH AT BEDTIME (Patient taking differently: Take 50 mg by mouth at bedtime.)    ROS:  Review of Systems  Constitutional: Negative.   HENT: Negative.   Eyes: Negative.   Respiratory: Negative.   Cardiovascular: Negative.   Gastrointestinal: Negative.   Genitourinary: Negative.   Musculoskeletal: Positive for back  pain.  Skin: Negative.   Neurological: Negative.   Endo/Heme/Allergies: Negative.   Psychiatric/Behavioral: Negative.      Objective:   Today's Vitals: BP 122/74 (BP Location: Right Arm, Patient Position: Sitting, Cuff Size: Normal)   Pulse (!) 102   Temp 98.6 F (37 C) (Tympanic)   Ht 5\' 8"  (1.727 m)   Wt 232 lb (105.2 kg)   SpO2 98%   BMI 35.28 kg/m  Vitals with BMI 09/14/2020 09/11/2020 09/11/2020  Height 5\' 8"  - -  Weight 232 lbs - -  BMI 24.26 - -  Systolic 834 196 222  Diastolic 74 62 89  Pulse 979 82 87     Physical Exam Vitals and nursing note reviewed.  Constitutional:      Appearance: Normal appearance. She is well-developed and well-groomed. She is obese.  HENT:     Head: Normocephalic and atraumatic.     Right Ear: External ear normal.     Left Ear: External ear normal.     Mouth/Throat:     Comments: Mask in place  Eyes:     General:  Right eye: No discharge.        Left eye: No discharge.     Conjunctiva/sclera: Conjunctivae normal.  Cardiovascular:     Rate and Rhythm: Normal rate and regular rhythm.     Pulses: Normal pulses.     Heart sounds: Normal heart sounds.  Pulmonary:     Effort: Pulmonary effort is normal.     Breath sounds: Normal breath sounds.  Musculoskeletal:        General: Normal range of motion.     Cervical back: Normal range of motion and neck supple.  Skin:    General: Skin is warm.  Neurological:     General: No focal deficit present.     Mental Status: She is alert and oriented to person, place, and time.  Psychiatric:        Attention and Perception: Attention normal.        Mood and Affect: Mood normal.        Speech: Speech normal.        Behavior: Behavior normal. Behavior is cooperative.        Thought Content: Thought content normal.        Cognition and Memory: Cognition normal.        Judgment: Judgment normal.     Assessment   1. Encounter for support and coordination of transition of care    2. Bilateral wheezing   3. Erosive gastritis   4. Gastroesophageal reflux disease with esophagitis, unspecified whether hemorrhage   5. Intractable nausea and vomiting   6. Chronic diarrhea   7. Chronic radicular lumbar pain   8. Obesity (BMI 30-39.9)     Tests ordered Orders Placed This Encounter  Procedures  . CBC  . Comprehensive metabolic panel     Plan: Please see assessment and plan per problem list above.   No orders of the defined types were placed in this encounter.   Patient to follow-up in as scheduled   Perlie Mayo, NP

## 2020-09-14 NOTE — Assessment & Plan Note (Signed)
Had stopped protonix and carafate  Restarted inpt Advised to not stop without talking to GI beforehand She is encouraged to take as directed. Patient acknowledged agreement and understanding of the plan.

## 2020-09-14 NOTE — Telephone Encounter (Signed)
Patient was in the hospital for stomach ulcers and pain in stomach and back. They gave her oxycodone and told her she have to get that here. I explain we do not fill narcotic meds over the phone, she wanted a msg sent back anyway.

## 2020-09-14 NOTE — Assessment & Plan Note (Signed)
Chronic- was suppose to get injection in back, but was admitted, so is rescheduling this. Following with Dr Holley Raring

## 2020-09-14 NOTE — Assessment & Plan Note (Signed)
She fluctuates with diarrhea and constipation.  She is ok right now- reports taking her medications as ordered.

## 2020-09-14 NOTE — Assessment & Plan Note (Signed)
Reviewed admission notes, labs, and test. Labs ordered for follow up Medications reviewed in detail Recommendations while inpt encouraged to continue outpt.

## 2020-09-14 NOTE — Patient Instructions (Addendum)
  I appreciate the opportunity to provide you with care for your health and wellness. Today we discussed: recent hospital admission  Follow up: as needed  Labs- today non fasting No referrals today  Call Dr Holley Raring and get back on schedule for injection to help back.  Merry Christmas and Happy New Year!  Please continue to practice social distancing to keep you, your family, and our community safe.  If you must go out, please wear a mask and practice good handwashing.  It was a pleasure to see you and I look forward to continuing to work together on your health and well-being. Please do not hesitate to call the office if you need care or have questions about your care.  Have a wonderful day. With Gratitude, Cherly Beach, DNP, AGNP-BC

## 2020-09-14 NOTE — Assessment & Plan Note (Signed)
Encouraged to stop smoking. She is still smoking 1 pk daily. She reports using her albuterol inhaler- on PE she had bilateral wheezing, is getting over rhinovirus. Though- I think a LABA might be a good idea if she unable to stop smoking.  Patient acknowledged agreement and understanding of the plan.

## 2020-09-14 NOTE — Assessment & Plan Note (Signed)
Had stopped protonix and carafate. She was restarted on these while inpt and is advised to not stop without talking to GI first. Patient acknowledged agreement and understanding of the plan.

## 2020-09-14 NOTE — Assessment & Plan Note (Signed)
Unchanged  Debbie Santana is re-educated about the importance of exercise daily to help with weight management. A minumum of 30 minutes daily is recommended. Additionally, importance of healthy food choices  with portion control discussed.   Wt Readings from Last 3 Encounters:  09/14/20 232 lb (105.2 kg)  09/08/20 230 lb (104.3 kg)  08/23/20 235 lb (106.6 kg)

## 2020-09-15 LAB — COMPREHENSIVE METABOLIC PANEL
ALT: 20 IU/L (ref 0–32)
AST: 12 IU/L (ref 0–40)
Albumin/Globulin Ratio: 1.7 (ref 1.2–2.2)
Albumin: 4 g/dL (ref 3.9–5.0)
Alkaline Phosphatase: 78 IU/L (ref 44–121)
BUN/Creatinine Ratio: 4 — ABNORMAL LOW (ref 9–23)
BUN: 4 mg/dL — ABNORMAL LOW (ref 6–20)
Bilirubin Total: <0.2 mg/dL (ref 0.0–1.2)
CO2: 26 mmol/L (ref 20–29)
Calcium: 9.8 mg/dL (ref 8.7–10.2)
Chloride: 103 mmol/L (ref 96–106)
Creatinine, Ser: 0.9 mg/dL (ref 0.57–1.00)
GFR calc Af Amer: 99 mL/min/{1.73_m2} (ref >59)
GFR calc non Af Amer: 86 mL/min/{1.73_m2} (ref >59)
Globulin, Total: 2.4 g/dL (ref 1.5–4.5)
Glucose: 75 mg/dL (ref 65–99)
Potassium: 4.5 mmol/L (ref 3.5–5.2)
Sodium: 140 mmol/L (ref 134–144)
Total Protein: 6.4 g/dL (ref 6.0–8.5)

## 2020-09-15 LAB — CBC
Hematocrit: 42.7 % (ref 34.0–46.6)
Hemoglobin: 14.3 g/dL (ref 11.1–15.9)
MCH: 31.3 pg (ref 26.6–33.0)
MCHC: 33.5 g/dL (ref 31.5–35.7)
MCV: 93 fL (ref 79–97)
Platelets: 266 10*3/uL (ref 150–450)
RBC: 4.57 x10E6/uL (ref 3.77–5.28)
RDW: 12.4 % (ref 11.7–15.4)
WBC: 10.1 10*3/uL (ref 3.4–10.8)

## 2020-09-18 ENCOUNTER — Encounter: Payer: Self-pay | Admitting: Student in an Organized Health Care Education/Training Program

## 2020-09-18 ENCOUNTER — Other Ambulatory Visit: Payer: Self-pay

## 2020-09-19 MED ORDER — TRAZODONE HCL 50 MG PO TABS: 50.0000 mg | ORAL_TABLET | Freq: Every day | ORAL | 3 refills | Status: DC

## 2020-10-06 ENCOUNTER — Other Ambulatory Visit: Payer: Self-pay | Admitting: Adult Health

## 2020-10-16 ENCOUNTER — Other Ambulatory Visit: Payer: Self-pay | Admitting: Adult Health

## 2020-10-16 MED ORDER — PROMETHAZINE HCL 25 MG PO TABS: 25.0000 mg | ORAL_TABLET | Freq: Four times a day (QID) | ORAL | 1 refills | Status: DC | PRN

## 2020-10-16 NOTE — Progress Notes (Signed)
Refilled phenergan

## 2020-10-24 ENCOUNTER — Other Ambulatory Visit: Payer: Self-pay | Admitting: Adult Health

## 2020-10-30 ENCOUNTER — Encounter (INDEPENDENT_AMBULATORY_CARE_PROVIDER_SITE_OTHER): Payer: Self-pay | Admitting: Gastroenterology

## 2020-10-30 ENCOUNTER — Ambulatory Visit (INDEPENDENT_AMBULATORY_CARE_PROVIDER_SITE_OTHER): Payer: Medicaid Other | Admitting: Gastroenterology

## 2020-10-30 ENCOUNTER — Other Ambulatory Visit: Payer: Self-pay

## 2020-10-30 VITALS — BP 124/84 | HR 94 | Temp 97.8°F | Ht 68.0 in | Wt 234.0 lb

## 2020-10-30 DIAGNOSIS — G8929 Other chronic pain: Secondary | ICD-10-CM

## 2020-10-30 DIAGNOSIS — K2100 Gastro-esophageal reflux disease with esophagitis, without bleeding: Secondary | ICD-10-CM

## 2020-10-30 DIAGNOSIS — K580 Irritable bowel syndrome with diarrhea: Secondary | ICD-10-CM | POA: Diagnosis not present

## 2020-10-30 DIAGNOSIS — R1013 Epigastric pain: Secondary | ICD-10-CM

## 2020-10-30 DIAGNOSIS — K589 Irritable bowel syndrome without diarrhea: Secondary | ICD-10-CM | POA: Insufficient documentation

## 2020-10-30 MED ORDER — HYOSCYAMINE SULFATE 0.125 MG SL SUBL: 0.1250 mg | SUBLINGUAL_TABLET | Freq: Four times a day (QID) | SUBLINGUAL | 2 refills | Status: DC | PRN

## 2020-10-30 NOTE — Patient Instructions (Addendum)
Schedule EGD in March 2021 Continue pantoprazole 40 mg BID Take FDGard as needed for abdominal pain Start Levsin every 6 hours as needed for abdominal pain if FDGard is not helping Can take Imodium as needed (up to 4 tablets per day) for diarrhea Explained presumed etiology of IBS symptoms. Patient was counseled about the benefit of implementing a low FODMAP to improve symptoms and recurrent episodes. A dietary list was provided to the patient. Also, the patient was counseled about the benefit of avoiding stressing situations and potential environmental triggers leading to symptomatology.

## 2020-10-30 NOTE — Progress Notes (Signed)
Maylon Peppers, M.D. Gastroenterology & Hepatology [facility] [address] [place], [place] [address]  Primary Care Physician: Perlie Mayo, NP [address] [place] [address]  I will communicate my assessment and recommendations to the referring MD via EMR.  Problems: 1. IBS-D 2. Chronic abdominal pain 3. Reflux esophagitis  History of Present Illness: Debbie Santana is a 30 y.o. female with past medical history of anxiety, depression, urolithiasis, GERD, who presents for follow up of diarrhea, abdominal pain and heartburn.  The patient was last seen on 07/27/2020. At that time, the patient was continued on PPI and Carafate twice a day.  For management of epigastric pain.  Due to recurrent diarrhea, she was ordered to have CRP, CBC, GI pathogen panel, C. difficile and ESR, all of which were completely normal.  She underwent a colposcopy which showed 2 non-adenomatous polyps, random colon biopsies were negative for microscopic colitis.  Due to recurrent abdominal pain, the patient was admitted to the hospital and [facility] on 09/08/2020.  She underwent a repeat EGD with findings described below.  She was discharged on PPI twice a day given esophagitis.  Given recurrent nausea and vomiting the patient was advised to stop the intake of marijuana due to concern for cyclic vomiting syndrome due to the intake of this drug.  The patient reports she is still having persistent pain in the epigastric area constantly, but this is better than in the past. She feels the sucralfate is helping decrease her symptoms. She is still presenting heartburn daily, even if she is taking the Protonix twice a day.  However, she feels that overall the pantoprazole has helped relieve her symptoms. She feels that she is having significant nausea and decreased appetite. She has been eating less than before which has led to some weight loss. She feels promethazine  helps for her nausea, takes it on average once a day. Patient reports that some of her symptoms could be related to her anxiety, but believes that it is not only related to this.  She reports that her diarrhea usually happens after she eats, usually 10 to 15 minutes after meal ingestion.  She has not been taking any antidiarrheal. Couple of weeks ago she saw some fresh blood in her stool but this has not happened again recently.  The patient denies having any fever, chills,  melena, hematemesis, abdominal distention, jaundice, pruritus or weight loss.  Last EGD: EGD was performed in August 2021 found to have erosive esophagitis with some gastric erosions, biopsies were negative for H. pylori.  Repeat EGD in 09/09/2020 - Moderately severe reflux esophagitis with no bleeding. - Gastritis. Biopsied. - Normal duodenal bulb, first portion of the duodenum and second portion of the Duodenum.  normal SB biopsies, gastric biopsies neg for HP. Last Colonoscopy: 08/29/2020  - normal TI, normal colon neg for microscopic colitis, 2 mm polyp in sigmoid (lymphoid aggregate) and 8 mm polyp in rectum (condyloma accuminatum)  Past Medical History: Past Medical History:  Diagnosis Date  . Anxiety   . Bradycardia 04/17/2017  . Chest pain on breathing 02/22/2020  . Chronic back pain   . Depression   . 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 Gastric ulcer   . Genital warts   . History of gestational hypertension 05/21/2018   Dx intrapartum  . History of kidney stones   . Intertrigo 11/16/2019  . Intractable abdominal pain 04/11/2017  . Intractable vomiting 05/20/2020  . Irregular intermenstrual  bleeding 02/19/2019  . Mental disorder   . Migraine without aura and without status migrainosus, not intractable 02/19/2019  . Nausea 05/10/2020  . Nausea & vomiting 12/07/2018  . Nexplanon in place 02/19/2019  . Obesity 04/17/2017  . Peptic ulcer disease 04/13/2017  . Postpartum hypertension 06/23/2018   requiring norvasc, resolved  at pp visit  . Sciatica   . Vaginal Pap smear, abnormal     Past Surgical History: Past Surgical History:  Procedure Laterality Date  . BACK SURGERY     ruptured disc.  . BIOPSY  04/14/2017   Procedure: BIOPSY;  Surgeon: Rogene Houston, MD;  [facility];  Service: Endoscopy;;  gastric  . BIOPSY  05/22/2020   Procedure: BIOPSY;  Surgeon: Daneil Dolin, MD;  [facility];  Service: Endoscopy;;  . BIOPSY  08/29/2020   Procedure: BIOPSY;  Surgeon: Harvel Quale, MD;  [facility];  Service: Gastroenterology;;  . BIOPSY  09/09/2020   Procedure: BIOPSY;  Surgeon: Eloise Harman, DO;  [facility];  Service: Endoscopy;;  duodenum;antral  . CERVICAL ABLATION N/A 12/02/2018   Procedure: LASER ABLATION OF CERVIX;  Surgeon: Florian Buff, MD;  Location: [facility];  Service: Gynecology;  Laterality: N/A;  . CHOLECYSTECTOMY N/A 04/17/2017   Procedure: LAPAROSCOPIC CHOLECYSTECTOMY;  Surgeon: Aviva Signs, MD;  Location: [facility];  Service: General;  Laterality: N/A;  . COLONOSCOPY WITH PROPOFOL N/A 08/29/2020   Procedure: COLONOSCOPY WITH PROPOFOL;  Surgeon: Harvel Quale, MD;  [facility];  Service: Gastroenterology;  Laterality: N/A;  11:15  . COLPOSCOPY    . ESOPHAGOGASTRODUODENOSCOPY (EGD) WITH PROPOFOL N/A 04/14/2017   Procedure: ESOPHAGOGASTRODUODENOSCOPY (EGD) WITH PROPOFOL;  Surgeon: Rogene Houston, MD;  [facility];  Service: Endoscopy;  Laterality: N/A;  . ESOPHAGOGASTRODUODENOSCOPY (EGD) WITH PROPOFOL N/A 05/22/2020   Procedure: ESOPHAGOGASTRODUODENOSCOPY (EGD) WITH PROPOFOL;  Surgeon: Daneil Dolin, MD;  [facility];  Service: Endoscopy;  Laterality: N/A;  . ESOPHAGOGASTRODUODENOSCOPY (EGD) WITH PROPOFOL N/A 09/09/2020   Procedure: ESOPHAGOGASTRODUODENOSCOPY (EGD) WITH PROPOFOL;  Surgeon: Eloise Harman, DO;  [facility];  Service: Endoscopy;  Laterality: N/A;  . HERNIA  REPAIR Bilateral    inguinal  . LASER ABLATION CONDOLAMATA N/A 12/02/2018   Procedure: LASER ABLATION CONDYLOMA ACCUMINATA LEFT AND RIGHT VULVA, PERINEUM AND PERIANAL (15 TOTAL);  Surgeon: Florian Buff, MD;  Location: [facility];  Service: Gynecology;  Laterality: N/A;  . POLYPECTOMY  08/29/2020   Procedure: POLYPECTOMY;  Surgeon: Harvel Quale, MD;  [facility];  Service: Gastroenterology;;    Family History: Family History  Problem Relation Age of Onset  . Anxiety disorder Mother   . Hyperlipidemia Mother   . Crohn's disease Sister   . Diabetes Maternal Grandfather   . Diabetes Cousin   . Learning disabilities Cousin     Social History: Social History   Tobacco Use  Smoking Status Current Every Day Smoker  . Packs/day: 0.50  . Years: 5.00  . Pack years: 2.50  . Types: Cigarettes  Smokeless Tobacco Never Used   Social History   Substance and Sexual Activity  Alcohol Use No   Social History   Substance and Sexual Activity  Drug Use No    Allergies: Allergies  Allergen Reactions  . Nicoderm [Nicotine] Itching    Sharp pain at site  Updated 09/10/2020- patient tolerated nicotine patch  . Nsaids Other (See Comments)    History  of ulcers    Medications: Current Outpatient Medications  Medication Sig Dispense Refill  . albuterol (VENTOLIN HFA) 108 (90 Base) MCG/ACT inhaler Inhale 2 puffs into the lungs every 6 (six) hours as needed for wheezing or shortness of breath. 1 each 0  . busPIRone (BUSPAR) 15 MG tablet Take 1 tablet (15 mg total) by mouth 3 (three) times daily. 90 tablet 6  . butalbital-acetaminophen-caffeine (FIORICET) 50-325-40 MG tablet TAKE 1 TABLET BY MOUTH EVERY 6 HOURS AS NEEDED FOR MIGRAINE HEADACHE 20 tablet 0  . etonogestrel (NEXPLANON) 68 MG IMPL implant 1 each by Subdermal route once.    . hydrOXYzine (ATARAX/VISTARIL) 25 MG tablet Take 1 tablet (25 mg total) by mouth every 6 (six) hours. 30 tablet 3  . megestrol (MEGACE)  40 MG tablet Take 2 daily (Patient taking differently: Take 80 mg by mouth daily. Take 2 daily) 60 tablet 3  . pantoprazole (PROTONIX) 40 MG tablet Take 1 tablet (40 mg total) by mouth 2 (two) times daily before a meal. 60 tablet 1  . PARoxetine (PAXIL) 40 MG tablet Take 1 tablet (40 mg total) by mouth every morning. 30 tablet 6  . pregabalin (LYRICA) 50 MG capsule Take 1 capsule (50 mg total) by mouth at bedtime for 10 days, THEN 1 capsule (50 mg total) 2 (two) times daily. 110 capsule 0  . promethazine (PHENERGAN) 25 MG tablet Take 1 tablet (25 mg total) by mouth every 6 (six) hours as needed for nausea or vomiting. 30 tablet 1  . PROMETHAZINE-DM PO Take 1 tablet by mouth daily as needed.    . sucralfate (CARAFATE) 1 g tablet Take 1 tablet (1 g total) by mouth 3 (three) times daily before meals. 90 tablet 3  . traZODone (DESYREL) 50 MG tablet Take 1 tablet (50 mg total) by mouth at bedtime. 30 tablet 3  . diphenhydrAMINE (BENADRYL) 25 MG tablet Take 25 mg by mouth every 6 (six) hours as needed (allergies.). (Patient not taking: Reported on 10/30/2020)    . oxyCODONE (OXY IR/ROXICODONE) 5 MG immediate release tablet Take 1 tablet (5 mg total) by mouth every 4 (four) hours as needed for severe pain. (Patient not taking: Reported on 10/30/2020) 12 tablet 0  . SUMAtriptan (IMITREX) 25 MG tablet Take 25 mg by mouth every 2 (two) hours as needed for migraine. May repeat in 2 hours if headache persists or recurs. (Patient not taking: Reported on 10/30/2020)     No current facility-administered medications for this visit.    Review of Systems: GENERAL: negative for malaise, night sweats HEENT: No changes in hearing or vision, no nose bleeds or other nasal problems. NECK: Negative for lumps, goiter, pain and significant neck swelling RESPIRATORY: Negative for cough, wheezing CARDIOVASCULAR: Negative for chest pain, leg swelling, palpitations, orthopnea GI: SEE HPI MUSCULOSKELETAL: Negative for joint pain or  swelling, back pain, and muscle pain. SKIN: Negative for lesions, rash PSYCH: Negative for sleep disturbance, mood disorder and recent psychosocial stressors. HEMATOLOGY Negative for prolonged bleeding, bruising easily, and swollen nodes. ENDOCRINE: Negative for cold or heat intolerance, polyuria, polydipsia and goiter. NEURO: negative for tremor, gait imbalance, syncope and seizures. The remainder of the review of systems is noncontributory.   Physical Exam: BP 124/84 (BP Location: Left Arm, Patient Position: Sitting, Cuff Size: Large)   Pulse 94   Temp 97.8 F (36.6 C) (Oral)   Ht 5' 8"  (1.727 m)   Wt 234 lb (106.1 kg)   BMI 35.58 kg/m  GENERAL: The patient  is AO x3, in no acute distress. HEENT: Head is normocephalic and atraumatic. EOMI are intact. Mouth is well hydrated and without lesions. NECK: Supple. No masses LUNGS: Clear to auscultation. No presence of rhonchi/wheezing/rales. Adequate chest expansion HEART: RRR, normal s1 and s2. ABDOMEN: mildly tender to palpation in the epigastric area but no guarding, no peritoneal signs, and nondistended. BS +. No masses. EXTREMITIES: Without any cyanosis, clubbing, rash, lesions or edema. NEUROLOGIC: AOx3, no focal motor deficit. SKIN: no jaundice, no rashes  Imaging/Labs: as above  I personally reviewed and interpreted the available labs, imaging and endoscopic files.  Impression and Plan: Debbie Santana is a 30 y.o. female with past medical history of anxiety, depression, urolithiasis, GERD, who presents for follow up of diarrhea, abdominal pain and heartburn.  The patient has presented persistent symptoms of diarrhea, abdominal pain and poor food intake.  She has undergone endoscopic investigations recently which have shown presence of changes suggestive of esophagitis with no alterations endoscopically or histologically.  Given the presence of esophagitis, she will require to have a repeat EGD upon finishing a 77-monthcourse of  her PPI to assess healing and rule out Barrett's esophagus, for which I will order a repeat EGD to be performed early March.  Otherwise, her symptoms of abdominal pain and diarrhea are likely related to functional etiology/IBS-D, for which I counseled the patient to continue taking the Carafate and to take Imodium as needed for diarrhea.  She will also benefit from implementing a low FODMAP diet.  If she has persistent abdominal pain she can take FDGard to promote improvement of her symptomatology.  Patient understood and agreed. - Explained presumed etiology of IBS symptoms. Patient was counseled about the benefit of implementing a low FODMAP to improve symptoms and recurrent episodes. A dietary list was provided to the patient. Also, the patient was counseled about the benefit of avoiding stressing situations and potential environmental triggers leading to symptomatology. - Schedule EGD in March 2021 - Continue pantoprazole 40 mg BID until EGD is performed - Take FDGard as needed for abdominal pain - Start Levsin every 6 hours as needed for abdominal pain if FDGard is not helping - Can take Imodium as needed (up to 4 tablets per day) for diarrhea  All questions were answered.      DHarvel Quale MD [facility]enterology and [facility]atology [facility]

## 2020-10-30 NOTE — H&P (View-Only) (Signed)
Debbie Santana, M.D. Gastroenterology & Hepatology [facility] [address] [place], [place] [address]  Primary Care Physician: Perlie Mayo, NP [address] [place] [address]  I will communicate my assessment and recommendations to the referring MD via EMR.  Problems: 1. IBS-D 2. Chronic abdominal pain 3. Reflux esophagitis  History of Present Illness: Debbie Santana is a 30 y.o. female with past medical history of anxiety, depression, urolithiasis, GERD, who presents for follow up of diarrhea, abdominal pain and heartburn.  The patient was last seen on 07/27/2020. At that time, the patient was continued on PPI and Carafate twice a day.  For management of epigastric pain.  Due to recurrent diarrhea, she was ordered to have CRP, CBC, GI pathogen panel, C. difficile and ESR, all of which were completely normal.  She underwent a colposcopy which showed 2 non-adenomatous polyps, random colon biopsies were negative for microscopic colitis.  Due to recurrent abdominal pain, the patient was admitted to the hospital and [facility] on 09/08/2020.  She underwent a repeat EGD with findings described below.  She was discharged on PPI twice a day given esophagitis.  Given recurrent nausea and vomiting the patient was advised to stop the intake of marijuana due to concern for cyclic vomiting syndrome due to the intake of this drug.  The patient reports she is still having persistent pain in the epigastric area constantly, but this is better than in the past. She feels the sucralfate is helping decrease her symptoms. She is still presenting heartburn daily, even if she is taking the Protonix twice a day.  However, she feels that overall the pantoprazole has helped relieve her symptoms. She feels that she is having significant nausea and decreased appetite. She has been eating less than before which has led to some weight loss. She feels promethazine  helps for her nausea, takes it on average once a day. Patient reports that some of her symptoms could be related to her anxiety, but believes that it is not only related to this.  She reports that her diarrhea usually happens after she eats, usually 10 to 15 minutes after meal ingestion.  She has not been taking any antidiarrheal. Couple of weeks ago she saw some fresh blood in her stool but this has not happened again recently.  The patient denies having any fever, chills,  melena, hematemesis, abdominal distention, jaundice, pruritus or weight loss.  Last EGD: EGD was performed in August 2021 found to have erosive esophagitis with some gastric erosions, biopsies were negative for H. pylori.  Repeat EGD in 09/09/2020 - Moderately severe reflux esophagitis with no bleeding. - Gastritis. Biopsied. - Normal duodenal bulb, first portion of the duodenum and second portion of the Duodenum.  normal SB biopsies, gastric biopsies neg for HP. Last Colonoscopy: 08/29/2020  - normal TI, normal colon neg for microscopic colitis, 2 mm polyp in sigmoid (lymphoid aggregate) and 8 mm polyp in rectum (condyloma accuminatum)  Past Medical History: Past Medical History:  Diagnosis Date  . Anxiety   . Bradycardia 04/17/2017  . Chest pain on breathing 02/22/2020  . Chronic back pain   . Depression   . 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 Gastric ulcer   . Genital warts   . History of gestational hypertension 05/21/2018   Dx intrapartum  . History of kidney stones   . Intertrigo 11/16/2019  . Intractable abdominal pain 04/11/2017  . Intractable vomiting 05/20/2020  . Irregular intermenstrual  bleeding 02/19/2019  . Mental disorder   . Migraine without aura and without status migrainosus, not intractable 02/19/2019  . Nausea 05/10/2020  . Nausea & vomiting 12/07/2018  . Nexplanon in place 02/19/2019  . Obesity 04/17/2017  . Peptic ulcer disease 04/13/2017  . Postpartum hypertension 06/23/2018   requiring norvasc, resolved  at pp visit  . Sciatica   . Vaginal Pap smear, abnormal     Past Surgical History: Past Surgical History:  Procedure Laterality Date  . BACK SURGERY     ruptured disc.  . BIOPSY  04/14/2017   Procedure: BIOPSY;  Surgeon: Rogene Houston, MD;  [facility];  Service: Endoscopy;;  gastric  . BIOPSY  05/22/2020   Procedure: BIOPSY;  Surgeon: Daneil Dolin, MD;  [facility];  Service: Endoscopy;;  . BIOPSY  08/29/2020   Procedure: BIOPSY;  Surgeon: Harvel Quale, MD;  [facility];  Service: Gastroenterology;;  . BIOPSY  09/09/2020   Procedure: BIOPSY;  Surgeon: Eloise Harman, DO;  [facility];  Service: Endoscopy;;  duodenum;antral  . CERVICAL ABLATION N/A 12/02/2018   Procedure: LASER ABLATION OF CERVIX;  Surgeon: Florian Buff, MD;  Location: [facility];  Service: Gynecology;  Laterality: N/A;  . CHOLECYSTECTOMY N/A 04/17/2017   Procedure: LAPAROSCOPIC CHOLECYSTECTOMY;  Surgeon: Aviva Signs, MD;  Location: [facility];  Service: General;  Laterality: N/A;  . COLONOSCOPY WITH PROPOFOL N/A 08/29/2020   Procedure: COLONOSCOPY WITH PROPOFOL;  Surgeon: Harvel Quale, MD;  [facility];  Service: Gastroenterology;  Laterality: N/A;  11:15  . COLPOSCOPY    . ESOPHAGOGASTRODUODENOSCOPY (EGD) WITH PROPOFOL N/A 04/14/2017   Procedure: ESOPHAGOGASTRODUODENOSCOPY (EGD) WITH PROPOFOL;  Surgeon: Rogene Houston, MD;  [facility];  Service: Endoscopy;  Laterality: N/A;  . ESOPHAGOGASTRODUODENOSCOPY (EGD) WITH PROPOFOL N/A 05/22/2020   Procedure: ESOPHAGOGASTRODUODENOSCOPY (EGD) WITH PROPOFOL;  Surgeon: Daneil Dolin, MD;  [facility];  Service: Endoscopy;  Laterality: N/A;  . ESOPHAGOGASTRODUODENOSCOPY (EGD) WITH PROPOFOL N/A 09/09/2020   Procedure: ESOPHAGOGASTRODUODENOSCOPY (EGD) WITH PROPOFOL;  Surgeon: Eloise Harman, DO;  [facility];  Service: Endoscopy;  Laterality: N/A;  . HERNIA  REPAIR Bilateral    inguinal  . LASER ABLATION CONDOLAMATA N/A 12/02/2018   Procedure: LASER ABLATION CONDYLOMA ACCUMINATA LEFT AND RIGHT VULVA, PERINEUM AND PERIANAL (15 TOTAL);  Surgeon: Florian Buff, MD;  Location: [facility];  Service: Gynecology;  Laterality: N/A;  . POLYPECTOMY  08/29/2020   Procedure: POLYPECTOMY;  Surgeon: Harvel Quale, MD;  [facility];  Service: Gastroenterology;;    Family History: Family History  Problem Relation Age of Onset  . Anxiety disorder Mother   . Hyperlipidemia Mother   . Crohn's disease Sister   . Diabetes Maternal Grandfather   . Diabetes Cousin   . Learning disabilities Cousin     Social History: Social History   Tobacco Use  Smoking Status Current Every Day Smoker  . Packs/day: 0.50  . Years: 5.00  . Pack years: 2.50  . Types: Cigarettes  Smokeless Tobacco Never Used   Social History   Substance and Sexual Activity  Alcohol Use No   Social History   Substance and Sexual Activity  Drug Use No    Allergies: Allergies  Allergen Reactions  . Nicoderm [Nicotine] Itching    Sharp pain at site  Updated 09/10/2020- patient tolerated nicotine patch  . Nsaids Other (See Comments)    History  of ulcers    Medications: Current Outpatient Medications  Medication Sig Dispense Refill  . albuterol (VENTOLIN HFA) 108 (90 Base) MCG/ACT inhaler Inhale 2 puffs into the lungs every 6 (six) hours as needed for wheezing or shortness of breath. 1 each 0  . busPIRone (BUSPAR) 15 MG tablet Take 1 tablet (15 mg total) by mouth 3 (three) times daily. 90 tablet 6  . butalbital-acetaminophen-caffeine (FIORICET) 50-325-40 MG tablet TAKE 1 TABLET BY MOUTH EVERY 6 HOURS AS NEEDED FOR MIGRAINE HEADACHE 20 tablet 0  . etonogestrel (NEXPLANON) 68 MG IMPL implant 1 each by Subdermal route once.    . hydrOXYzine (ATARAX/VISTARIL) 25 MG tablet Take 1 tablet (25 mg total) by mouth every 6 (six) hours. 30 tablet 3  . megestrol (MEGACE)  40 MG tablet Take 2 daily (Patient taking differently: Take 80 mg by mouth daily. Take 2 daily) 60 tablet 3  . pantoprazole (PROTONIX) 40 MG tablet Take 1 tablet (40 mg total) by mouth 2 (two) times daily before a meal. 60 tablet 1  . PARoxetine (PAXIL) 40 MG tablet Take 1 tablet (40 mg total) by mouth every morning. 30 tablet 6  . pregabalin (LYRICA) 50 MG capsule Take 1 capsule (50 mg total) by mouth at bedtime for 10 days, THEN 1 capsule (50 mg total) 2 (two) times daily. 110 capsule 0  . promethazine (PHENERGAN) 25 MG tablet Take 1 tablet (25 mg total) by mouth every 6 (six) hours as needed for nausea or vomiting. 30 tablet 1  . PROMETHAZINE-DM PO Take 1 tablet by mouth daily as needed.    . sucralfate (CARAFATE) 1 g tablet Take 1 tablet (1 g total) by mouth 3 (three) times daily before meals. 90 tablet 3  . traZODone (DESYREL) 50 MG tablet Take 1 tablet (50 mg total) by mouth at bedtime. 30 tablet 3  . diphenhydrAMINE (BENADRYL) 25 MG tablet Take 25 mg by mouth every 6 (six) hours as needed (allergies.). (Patient not taking: Reported on 10/30/2020)    . oxyCODONE (OXY IR/ROXICODONE) 5 MG immediate release tablet Take 1 tablet (5 mg total) by mouth every 4 (four) hours as needed for severe pain. (Patient not taking: Reported on 10/30/2020) 12 tablet 0  . SUMAtriptan (IMITREX) 25 MG tablet Take 25 mg by mouth every 2 (two) hours as needed for migraine. May repeat in 2 hours if headache persists or recurs. (Patient not taking: Reported on 10/30/2020)     No current facility-administered medications for this visit.    Review of Systems: GENERAL: negative for malaise, night sweats HEENT: No changes in hearing or vision, no nose bleeds or other nasal problems. NECK: Negative for lumps, goiter, pain and significant neck swelling RESPIRATORY: Negative for cough, wheezing CARDIOVASCULAR: Negative for chest pain, leg swelling, palpitations, orthopnea GI: SEE HPI MUSCULOSKELETAL: Negative for joint pain or  swelling, back pain, and muscle pain. SKIN: Negative for lesions, rash PSYCH: Negative for sleep disturbance, mood disorder and recent psychosocial stressors. HEMATOLOGY Negative for prolonged bleeding, bruising easily, and swollen nodes. ENDOCRINE: Negative for cold or heat intolerance, polyuria, polydipsia and goiter. NEURO: negative for tremor, gait imbalance, syncope and seizures. The remainder of the review of systems is noncontributory.   Physical Exam: BP 124/84 (BP Location: Left Arm, Patient Position: Sitting, Cuff Size: Large)   Pulse 94   Temp 97.8 F (36.6 C) (Oral)   Ht 5' 8"  (1.727 m)   Wt 234 lb (106.1 kg)   BMI 35.58 kg/m  GENERAL: The patient  is AO x3, in no acute distress. HEENT: Head is normocephalic and atraumatic. EOMI are intact. Mouth is well hydrated and without lesions. NECK: Supple. No masses LUNGS: Clear to auscultation. No presence of rhonchi/wheezing/rales. Adequate chest expansion HEART: RRR, normal s1 and s2. ABDOMEN: mildly tender to palpation in the epigastric area but no guarding, no peritoneal signs, and nondistended. BS +. No masses. EXTREMITIES: Without any cyanosis, clubbing, rash, lesions or edema. NEUROLOGIC: AOx3, no focal motor deficit. SKIN: no jaundice, no rashes  Imaging/Labs: as above  I personally reviewed and interpreted the available labs, imaging and endoscopic files.  Impression and Plan: Debbie Santana is a 30 y.o. female with past medical history of anxiety, depression, urolithiasis, GERD, who presents for follow up of diarrhea, abdominal pain and heartburn.  The patient has presented persistent symptoms of diarrhea, abdominal pain and poor food intake.  She has undergone endoscopic investigations recently which have shown presence of changes suggestive of esophagitis with no alterations endoscopically or histologically.  Given the presence of esophagitis, she will require to have a repeat EGD upon finishing a 97-monthcourse of  her PPI to assess healing and rule out Barrett's esophagus, for which I will order a repeat EGD to be performed early March.  Otherwise, her symptoms of abdominal pain and diarrhea are likely related to functional etiology/IBS-D, for which I counseled the patient to continue taking the Carafate and to take Imodium as needed for diarrhea.  She will also benefit from implementing a low FODMAP diet.  If she has persistent abdominal pain she can take FDGard to promote improvement of her symptomatology.  Patient understood and agreed. - Explained presumed etiology of IBS symptoms. Patient was counseled about the benefit of implementing a low FODMAP to improve symptoms and recurrent episodes. A dietary list was provided to the patient. Also, the patient was counseled about the benefit of avoiding stressing situations and potential environmental triggers leading to symptomatology. - Schedule EGD in March 2021 - Continue pantoprazole 40 mg BID until EGD is performed - Take FDGard as needed for abdominal pain - Start Levsin every 6 hours as needed for abdominal pain if FDGard is not helping - Can take Imodium as needed (up to 4 tablets per day) for diarrhea  All questions were answered.      DHarvel Quale MD Gastroenterology and Hepatology [facility]

## 2020-10-31 ENCOUNTER — Ambulatory Visit (INDEPENDENT_AMBULATORY_CARE_PROVIDER_SITE_OTHER): Payer: Medicaid Other | Admitting: Adult Health

## 2020-10-31 ENCOUNTER — Encounter: Payer: Self-pay | Admitting: Adult Health

## 2020-10-31 ENCOUNTER — Other Ambulatory Visit (HOSPITAL_COMMUNITY)
Admission: RE | Admit: 2020-10-31 | Discharge: 2020-10-31 | Disposition: A | Discharge status: Home / Self Care | Payer: Medicaid Other | Source: Ambulatory Visit | Attending: Adult Health | Admitting: Adult Health

## 2020-10-31 VITALS — BP 117/80 | HR 69 | Ht 68.25 in | Wt 237.0 lb

## 2020-10-31 DIAGNOSIS — Z975 Presence of (intrauterine) contraceptive device: Secondary | ICD-10-CM | POA: Diagnosis not present

## 2020-10-31 DIAGNOSIS — R87610 Atypical squamous cells of undetermined significance on cytologic smear of cervix (ASC-US): Secondary | ICD-10-CM | POA: Diagnosis not present

## 2020-10-31 DIAGNOSIS — F419 Anxiety disorder, unspecified: Secondary | ICD-10-CM | POA: Diagnosis not present

## 2020-10-31 DIAGNOSIS — F32A Depression, unspecified: Secondary | ICD-10-CM | POA: Diagnosis not present

## 2020-10-31 DIAGNOSIS — R87613 High grade squamous intraepithelial lesion on cytologic smear of cervix (HGSIL): Secondary | ICD-10-CM | POA: Diagnosis not present

## 2020-10-31 NOTE — Progress Notes (Signed)
Patient ID: Debbie Santana, female   DOB: 01-26-1991, 30 y.o.   MRN: [id number] History of Present Illness: Debbie Santana is a 30 year old white female,single, G2P1011 in for pap smear had ASCUS with negative HPV 01/27/20 she is getting pap smears every 6 months per Dr Elonda Husky. She had laser ablation of cervix and of vulva condyloma 12/02/2018.She had HSIL in 2020. She saw GI yesterday and getting EGD in March. PCP is Cherly Beach NP.  Current Medications, Allergies, Past Medical History, Past Surgical History, Family History and Social History were reviewed in [employer] record.     Review of Systems: Patient denies any daily headaches, hearing loss, fatigue, blurred vision, shortness of breath, chest pain, abdominal pain, problems with bowel movements, urination, or intercourse. No joint pain or mood swings. She is feels like anxiety meds need changing.   Physical Exam:BP 117/80 (BP Location: Left Arm, Patient Position: Sitting, Cuff Size: Large)   Pulse 69   Ht 5' 8.25" (1.734 m)   Wt 237 lb (107.5 kg)   LMP 10/27/2020   BMI 35.77 kg/m  General:  Well developed, well nourished, no acute distress Skin:  Warm and dry Neck:  Midline trachea, normal thyroid, good ROM, no lymphadenopathy Lungs; Clear to auscultation bilaterally Cardiovascular: Regular rate and rhythm Pelvic:  External genitalia is normal in appearance, no lesions.  The vagina is normal in appearance,+blood in vault. Urethra has no lesions or masses. The cervix is bulbous.Pap with GC/CHL and HRHPV genotyping performed.  Uterus is felt to be normal size, shape, and contour.  No adnexal masses or tenderness noted.Bladder is non tender, no masses felt. Extremities/musculoskeletal:  No swelling or varicosities noted, no clubbing or cyanosis Psych:  No mood changes, alert and cooperative,seems happy AA is 0 Fall risk is moderate PHQ 9 score is 17 denies any SI or HI is on meds GAD 7 score is 20, she is on meds, and  thinks they need to be changed, I offered referral to [facility], that I am at my limit on these meds, but she says she may see Dr [name], will let me know.   [facility] - 10/31/20 1340      Pregnancy Intention Screening   Does the patient want to become pregnant in the next year? No    Does the patient's partner want to become pregnant in the next year? No    Would the patient like to discuss contraceptive options today? No      Contraception Wrap Up   Current Method Hormonal Implant    End Method Hormonal Implant    Contraception Counseling Provided No         Examination chaperoned by Tinnie Gens NP student and she assisted with exam  Impression and Plan: 1. Atypical squamous cells of undetermined significance on cytologic smear of cervix (ASC-US) Pap sent  2. Anxiety and depression Continue current meds  3. Nexplanon in place   4. High grade squamous intraepithelial cervical dysplasia, S/P cervical conization Pap sent Pap and physical in 6 months

## 2020-11-01 ENCOUNTER — Encounter (INDEPENDENT_AMBULATORY_CARE_PROVIDER_SITE_OTHER): Payer: Self-pay

## 2020-11-07 LAB — CYTOLOGY - PAP
Chlamydia: NEGATIVE
Comment: NEGATIVE
Comment: NEGATIVE
Comment: NORMAL
Diagnosis: UNDETERMINED — AB
High risk HPV: NEGATIVE
Neisseria Gonorrhea: NEGATIVE

## 2020-11-13 ENCOUNTER — Other Ambulatory Visit: Payer: Self-pay | Admitting: Adult Health

## 2020-11-14 ENCOUNTER — Other Ambulatory Visit (INDEPENDENT_AMBULATORY_CARE_PROVIDER_SITE_OTHER): Payer: Self-pay

## 2020-11-20 ENCOUNTER — Other Ambulatory Visit (HOSPITAL_COMMUNITY)
Admission: RE | Admit: 2020-11-20 | Discharge: 2020-11-20 | Disposition: A | Discharge status: Home / Self Care | Payer: Medicaid Other | Source: Ambulatory Visit | Attending: Gastroenterology | Admitting: Gastroenterology

## 2020-11-20 ENCOUNTER — Other Ambulatory Visit: Payer: Self-pay

## 2020-11-20 DIAGNOSIS — Z20822 Contact with and (suspected) exposure to covid-19: Secondary | ICD-10-CM | POA: Insufficient documentation

## 2020-11-20 DIAGNOSIS — Z01812 Encounter for preprocedural laboratory examination: Secondary | ICD-10-CM | POA: Diagnosis not present

## 2020-11-20 LAB — PREGNANCY, URINE: Preg Test, Ur: NEGATIVE

## 2020-11-20 LAB — SARS CORONAVIRUS 2 (TAT 6-24 HRS): SARS Coronavirus 2: NEGATIVE

## 2020-11-21 ENCOUNTER — Ambulatory Visit (HOSPITAL_COMMUNITY): Payer: Medicaid Other | Admitting: Anesthesiology

## 2020-11-21 ENCOUNTER — Encounter (HOSPITAL_COMMUNITY)
Admission: RE | Disposition: A | Discharge status: Home / Self Care | Payer: Self-pay | Source: Home / Self Care | Attending: Gastroenterology

## 2020-11-21 ENCOUNTER — Ambulatory Visit (HOSPITAL_COMMUNITY)
Admission: RE | Admit: 2020-11-21 | Discharge: 2020-11-21 | Disposition: A | Discharge status: Home / Self Care | Payer: Medicaid Other | Attending: Gastroenterology | Admitting: Gastroenterology

## 2020-11-21 ENCOUNTER — Encounter (HOSPITAL_COMMUNITY): Payer: Self-pay | Admitting: Gastroenterology

## 2020-11-21 ENCOUNTER — Other Ambulatory Visit: Payer: Self-pay

## 2020-11-21 DIAGNOSIS — K580 Irritable bowel syndrome with diarrhea: Secondary | ICD-10-CM | POA: Insufficient documentation

## 2020-11-21 DIAGNOSIS — R12 Heartburn: Secondary | ICD-10-CM | POA: Diagnosis not present

## 2020-11-21 DIAGNOSIS — K2100 Gastro-esophageal reflux disease with esophagitis, without bleeding: Secondary | ICD-10-CM | POA: Insufficient documentation

## 2020-11-21 DIAGNOSIS — F32A Depression, unspecified: Secondary | ICD-10-CM | POA: Diagnosis not present

## 2020-11-21 DIAGNOSIS — F418 Other specified anxiety disorders: Secondary | ICD-10-CM | POA: Diagnosis not present

## 2020-11-21 DIAGNOSIS — Z79899 Other long term (current) drug therapy: Secondary | ICD-10-CM | POA: Diagnosis not present

## 2020-11-21 DIAGNOSIS — R197 Diarrhea, unspecified: Secondary | ICD-10-CM | POA: Diagnosis not present

## 2020-11-21 DIAGNOSIS — F17210 Nicotine dependence, cigarettes, uncomplicated: Secondary | ICD-10-CM | POA: Diagnosis not present

## 2020-11-21 DIAGNOSIS — F419 Anxiety disorder, unspecified: Secondary | ICD-10-CM | POA: Insufficient documentation

## 2020-11-21 DIAGNOSIS — G8929 Other chronic pain: Secondary | ICD-10-CM | POA: Insufficient documentation

## 2020-11-21 DIAGNOSIS — Z793 Long term (current) use of hormonal contraceptives: Secondary | ICD-10-CM | POA: Insufficient documentation

## 2020-11-21 DIAGNOSIS — R109 Unspecified abdominal pain: Secondary | ICD-10-CM | POA: Diagnosis not present

## 2020-11-21 HISTORY — DX: Nausea with vomiting, unspecified: R11.2

## 2020-11-21 HISTORY — PX: ESOPHAGOGASTRODUODENOSCOPY (EGD) WITH PROPOFOL: SHX5813

## 2020-11-21 HISTORY — DX: Nausea with vomiting, unspecified: Z98.890

## 2020-11-21 SURGERY — ESOPHAGOGASTRODUODENOSCOPY (EGD) WITH PROPOFOL
Anesthesia: General

## 2020-11-21 MED ORDER — LIDOCAINE VISCOUS HCL 2 % MT SOLN: 15.0000 mL | Freq: Once | OROMUCOSAL | Status: DC

## 2020-11-21 MED ORDER — LIDOCAINE HCL (CARDIAC) PF 100 MG/5ML IV SOSY
PREFILLED_SYRINGE | INTRAVENOUS | Status: DC | PRN
  Administered 2020-11-21: 50 mg via INTRAVENOUS

## 2020-11-21 MED ORDER — GLYCOPYRROLATE 0.2 MG/ML IJ SOLN: 0.2000 mg | Freq: Once | INTRAMUSCULAR | Status: DC

## 2020-11-21 MED ORDER — GLYCOPYRROLATE 0.2 MG/ML IJ SOLN
INTRAMUSCULAR | Status: AC
  Filled 2020-11-21: qty 1

## 2020-11-21 MED ORDER — LACTATED RINGERS IV SOLN: INTRAVENOUS | Status: DC

## 2020-11-21 MED ORDER — LIDOCAINE VISCOUS HCL 2 % MT SOLN
OROMUCOSAL | Status: AC
  Filled 2020-11-21: qty 15

## 2020-11-21 MED ORDER — OMEPRAZOLE 40 MG PO CPDR: 40.0000 mg | DELAYED_RELEASE_CAPSULE | Freq: Two times a day (BID) | ORAL | 1 refills | Status: DC

## 2020-11-21 MED ORDER — PROPOFOL 10 MG/ML IV BOLUS
INTRAVENOUS | Status: DC | PRN
Start: 2020-11-21 — End: 2020-11-21
  Administered 2020-11-21 (×2): 100 mg via INTRAVENOUS

## 2020-11-21 MED ORDER — PROPOFOL 500 MG/50ML IV EMUL
INTRAVENOUS | Status: DC | PRN
  Administered 2020-11-21: 150 ug/kg/min via INTRAVENOUS

## 2020-11-21 NOTE — Interval H&P Note (Signed)
History and Physical Interval Note:  11/21/2020 10:40 AM  Debbie Santana is a 30 y.o. female with past medical history of anxiety, depression, urolithiasis, GERD, IBS-D, who presents for follow up of esophagitis and epigastric abdominal pain.  Patient reports having persistent abdominal pain in epigastric area despite taking her PPI and using the sucralfate.  Overall feels better compared to her previous admission to the hospital but still having some persistent discomfort.  Denies any heartburn or dysphagia at the moment.  No odynophagia or dysphagia.  Last EGD: EGD was performed in August 2021 found to have erosive esophagitis with some gastric erosions, biopsies were negative for H. pylori.  Repeat EGD in 09/09/2020 - Moderately severe reflux esophagitis with no bleeding. - Gastritis. Biopsied. - Normal duodenal bulb, first portion of the duodenum and second portion of the Duodenum.  normal SB biopsies, gastric biopsies neg for HP.  BP 125/80   Pulse 72   Temp 98.3 F (36.8 C) (Oral)   Resp 13   Ht 5\' 8"  (1.727 m)   Wt 106.1 kg   LMP 10/27/2020   SpO2 (!) 77%   BMI 35.58 kg/m  GENERAL: The patient is AO x3, in no acute distress. HEENT: Head is normocephalic and atraumatic. EOMI are intact. Mouth is well hydrated and without lesions. NECK: Supple. No masses LUNGS: Clear to auscultation. No presence of rhonchi/wheezing/rales. Adequate chest expansion HEART: RRR, normal s1 and s2. ABDOMEN: mildly tender in epigastric area, no guarding, no peritoneal signs, and nondistended. BS +. No masses. EXTREMITIES: Without any cyanosis, clubbing, rash, lesions or edema. NEUROLOGIC: AOx3, no focal motor deficit. SKIN: no jaundice, no rashes   [name]  has presented today for surgery, with the diagnosis of Esophagitits.  The various methods of treatment have been discussed with the patient and family. After consideration of risks, benefits and other options for treatment, the patient has  consented to  Procedure(s) with comments: ESOPHAGOGASTRODUODENOSCOPY (EGD) WITH PROPOFOL (N/A) - Am as a surgical intervention.  The patient's history has been reviewed, patient examined, no change in status, stable for surgery.  I have reviewed the patient's chart and labs.  Questions were answered to the patient's satisfaction.     Maylon Peppers [place]a

## 2020-11-21 NOTE — Discharge Instructions (Signed)
You are being discharged to home.  Resume your previous diet.  Your physician has recommended a repeat upper endoscopy in three months for surveillance.  Take Prilosec (omeprazole) 40 mg by mouth twice a day for three months.  STOP PANTOPRAZOLE Can try oral peppermint oil to improve abdominal pain  Esophagitis  Esophagitis is inflammation of the esophagus. The esophagus is the tube that carries food from the mouth to the stomach. Esophagitis can cause soreness or pain in the esophagus. This condition can make it difficult and painful to swallow. What are the causes? Most causes of esophagitis are not serious. Common causes of this condition include:  Gastroesophageal reflux disease (GERD). This is when stomach contents move back up into the esophagus (reflux).  Repeated vomiting.  An allergic reaction, especially caused by food allergies (eosinophilic esophagitis).  Injury to the esophagus by swallowing large pills with or without water, or swallowing certain types of medicines.  Swallowing harmful chemicals, such as household cleaning products.  Drinking a lot of alcohol.  An infection of the esophagus. This most often occurs in people who have a weakened immune system.  Radiation or chemotherapy treatment for cancer.  Certain diseases such as sarcoidosis, Crohn's disease, and scleroderma. What are the signs or symptoms? Symptoms of this condition include:  Difficult or painful swallowing.  Pain with swallowing acidic liquids, such as citrus juices. You may also have pain when you burp.  Chest pain and difficulty breathing.  Nausea and vomiting.  Pain in the abdomen.  Weight loss.  Ulcers in the mouth and white patches in the mouth (candidiasis).  Fever.  Coughing up blood or vomiting blood.  Stool that is black, tarry, or bright red. How is this diagnosed? This condition may be diagnosed based on your medical history and a physical exam. You may also have other  tests, including:  A test to examine your esophagus and stomach with a small flexible tube with a camera (endoscopy).  A test that measures the acidity level in your esophagus.  A test that measures how much pressure is on your esophagus.  A barium swallow or modified barium swallow to show the shape, size, and functioning of your esophagus.  Allergy tests. How is this treated? Treatment for this condition depends on the cause of your esophagitis. In some cases, steroids or other medicines may be given to help relieve your symptoms or to treat the underlying cause of your condition. You may have to make some lifestyle changes, such as:  Avoiding alcohol.  Quitting any products that contain nicotine or tobacco. These products include cigarettes, chewing tobacco, and vaping devices, such as e-cigarettes. If you need help quitting, ask your health care provider.  Changing your diet.  Exercising.  Changing your sleep habits and your sleep environment. Follow these instructions at home: Medicines  Take over-the-counter and prescription medicines only as told by your health care provider.  Do not take aspirin, ibuprofen, or other NSAIDs unless your health care provider told you to do so.  If you have trouble taking pills: ? Use a pill splitter to decrease the size of the pill. This will decrease the chance of the pill getting stuck or injuring your esophagus. ? Drink water after you take a pill. Eating and drinking  Avoid foods and drinks that seem to make your symptoms worse.  Follow a diet as recommended by your health care provider. This may involve avoiding foods and drinks such as: ? Coffee and tea, with or without  caffeine. ? Drinks that contain alcohol. ? Energy drinks and sports drinks. ? Carbonated drinks or sodas. ? Chocolate and cocoa. ? Peppermint and mint flavorings. ? Garlic and onions. ? Horseradish. ? Spicy and acidic foods, including peppers, chili powder,  curry powder, vinegar, hot sauces, and barbecue sauce. ? Citrus fruit juices and citrus fruits, such as oranges, lemons, and limes. ? Tomato-based foods, such as red sauce, chili, salsa, and pizza with red sauce. ? Fried and fatty foods, such as donuts, french fries, potato chips, and high-fat dressings. ? High-fat meats, such as hot dogs and fatty cuts of red and white meats, such as rib eye steak, sausage, ham, and bacon. ? High-fat dairy items, such as whole milk, butter, and cream cheese.   Lifestyle  Eat small, frequent meals instead of large meals.  Avoid drinking large amounts of liquid with your meals.  Avoid eating meals during the 2-3 hours before bedtime.  Avoid lying down right after you eat.  Do not exercise right after you eat.  Do not use any products that contain nicotine or tobacco. These products include cigarettes, chewing tobacco, and vaping devices, such as e-cigarettes. If you need help quitting, ask your health care provider. General instructions  Pay attention to any changes in your symptoms. Let your health care provider know about them.  Wear loose-fitting clothing. Do not wear anything tight around your waist that causes pressure on your abdomen.  Raise (elevate) the head of your bed about 6 inches (15 cm). You may need to use a wedge to do this.  Try relaxation strategies such as yoga, deep breathing, or meditation to manage stress. If you need help reducing stress, ask your health care provider.  If you are overweight, reduce your weight to an amount that is healthy for you. Ask your health care provider for guidance about a safe weight loss goal.  Keep all follow-up visits. This is important.   Contact a health care provider if:  You have new symptoms.  You have unexplained weight loss.  You have difficulty swallowing, or it hurts to swallow.  You have wheezing or a cough that does not go away.  Your symptoms do not improve with  treatment.  You have frequent heartburn for more than two weeks. Get help right away if:  You have sudden severe pain in your arms, neck, jaw, teeth, or back.  You suddenly feel sweaty, dizzy, or light-headed.  You have chest pain or shortness of breath.  You vomit and the vomit is green, yellow, or black, or it looks like blood or coffee grounds.  Your stool is red, bloody, or black.  You have a fever.  You cannot swallow, drink, or eat. These symptoms may represent a serious problem that is an emergency. Do not wait to see if the symptoms will go away. Get medical help right away. Call your local emergency services (911 in the [place] Do not drive yourself to the hospital. Summary  Esophagitis is inflammation of the esophagus.  Most causes of esophagitis are not serious.  Follow your health care provider's instructions about eating and drinking.  Contact a health care provider if you have new symptoms, have weight loss, or coughing that does not stop.  Get help right away if you have severe pain in the arms, neck, jaw, teeth, or back, or if you have chest pain, shortness of breath, or fever. This information is not intended to replace advice given to you by your health care  provider. Make sure you discuss any questions you have with your health care provider. Document Revised: 03/20/2020 Document Reviewed: 03/20/2020 Elsevier Patient Education  2021 [employer].    Upper Endoscopy, Adult, Care After This sheet gives you information about how to care for yourself after your procedure. Your health care provider may also give you more specific instructions. If you have problems or questions, contact your health care provider. What can I expect after the procedure? After the procedure, it is common to have:  A sore throat.  Mild stomach pain or discomfort.  Bloating.  Nausea. Follow these instructions at home:  Follow instructions from your health care provider about  what to eat or drink after your procedure.  Return to your normal activities as told by your health care provider. Ask your health care provider what activities are safe for you.  Take over-the-counter and prescription medicines only as told by your health care provider.  If you were given a sedative during the procedure, it can affect you for several hours. Do not drive or operate machinery until your health care provider says that it is safe.  Keep all follow-up visits as told by your health care provider. This is important.   Contact a health care provider if you have:  A sore throat that lasts longer than one day.  Trouble swallowing. Get help right away if:  You vomit blood or your vomit looks like coffee grounds.  You have: ? A fever. ? Bloody, black, or tarry stools. ? A severe sore throat or you cannot swallow. ? Difficulty breathing. ? Severe pain in your chest or abdomen. Summary  After the procedure, it is common to have a sore throat, mild stomach discomfort, bloating, and nausea.  If you were given a sedative during the procedure, it can affect you for several hours. Do not drive or operate machinery until your health care provider says that it is safe.  Follow instructions from your health care provider about what to eat or drink after your procedure.  Return to your normal activities as told by your health care provider. This information is not intended to replace advice given to you by your health care provider. Make sure you discuss any questions you have with your health care provider. Document Revised: 09/07/2019 Document Reviewed: 02/09/2018 Elsevier Patient Education  2021 [employer].

## 2020-11-21 NOTE — Anesthesia Postprocedure Evaluation (Signed)
Anesthesia Post Note  Patient: Debbie Santana  Procedure(s) Performed: ESOPHAGOGASTRODUODENOSCOPY (EGD) WITH PROPOFOL (N/A )  Patient location during evaluation: Phase II Anesthesia Type: General Level of consciousness: awake and alert Pain management: satisfactory to patient Vital Signs Assessment: post-procedure vital signs reviewed and stable Respiratory status: spontaneous breathing and respiratory function stable Cardiovascular status: stable and blood pressure returned to baseline Postop Assessment: no apparent nausea or vomiting Anesthetic complications: no   No complications documented.   Last Vitals:  Vitals:   11/21/20 1011 11/21/20 1205  BP: 125/80 117/80  Pulse: 72   Resp: 13 19  Temp: 36.8 C 36.7 C  SpO2: (!) 77% 96%    Last Pain:  Vitals:   11/21/20 1205  TempSrc: Oral  PainSc:                  Karna Dupes

## 2020-11-21 NOTE — Transfer of Care (Signed)
Immediate Anesthesia Transfer of Care Note  Patient: Debbie Santana  Procedure(s) Performed: ESOPHAGOGASTRODUODENOSCOPY (EGD) WITH PROPOFOL (N/A )  Patient Location: PACU  Anesthesia Type:General  Level of Consciousness: awake, alert  and oriented  Airway & Oxygen Therapy: Patient Spontanous Breathing  Post-op Assessment: Report given to RN and Post -op Vital signs reviewed and stable  Post vital signs: Reviewed and stable  Last Vitals:  Vitals Value Taken Time  BP 117/80 11/21/20 1205  Temp 36.7 C 11/21/20 1205  Pulse    Resp 19 11/21/20 1205  SpO2 96 % 11/21/20 1205    Last Pain:  Vitals:   11/21/20 1205  TempSrc: Oral  PainSc:       Patients Stated Pain Goal: 6 (02/89/02 2840)  Complications: No complications documented.

## 2020-11-21 NOTE — Op Note (Signed)
[facility] Patient Name: Debbie Santana Procedure Date: 11/21/2020 11:42 AM MRN: [id number] Date of Birth: 1991-03-22 Attending MD: Maylon Peppers ,  CSN: [id number] Age: 30 Admit Type: Outpatient Procedure:                Upper GI endoscopy Indications:              Reflux esophagitis Providers:                Maylon Peppers, Gwenlyn Fudge, RN, Aram Candela Referring MD:              Medicines:                Monitored Anesthesia Care Complications:            No immediate complications. Estimated Blood Loss:     Estimated blood loss: none. Procedure:                Pre-Anesthesia Assessment:                           - Prior to the procedure, a History and Physical                            was performed, and patient medications, allergies                            and sensitivities were reviewed. The patient's                            tolerance of previous anesthesia was reviewed.                           - The risks and benefits of the procedure and the                            sedation options and risks were discussed with the                            patient. All questions were answered and informed                            consent was obtained.                           - ASA Grade Assessment: II - A patient with mild                            systemic disease.                           After obtaining informed consent, the endoscope was                            passed under direct vision. Throughout the                            procedure, the patient's blood pressure, pulse,  and                            oxygen saturations were monitored continuously. The                            [id number]-[id number] ([id number]) was introduced through the                            mouth, and advanced to the second part of duodenum.                            The upper GI endoscopy was accomplished without                            difficulty. The patient tolerated the procedure                             well. Scope In: 11:57:35 AM Scope Out: 12:02:20 PM Total Procedure Duration: 0 hours 4 minutes 45 seconds  Findings:      LA Grade A (one or more mucosal breaks less than 5 mm, not extending       between tops of 2 mucosal folds) esophagitis with no bleeding was found       in the lower third of the esophagus.      The entire examined stomach was normal.      The examined duodenum was normal. Impression:               - LA Grade A reflux esophagitis with no bleeding.                           - Normal stomach.                           - Normal examined duodenum.                           - No specimens collected. Moderate Sedation:      Per Anesthesia Care Recommendation:           - Discharge patient to home (ambulatory).                           - Resume previous diet.                           - Repeat upper endoscopy in 3 months for                            surveillance.                           - Use Prilosec (omeprazole) 40 mg PO BID for 3                            months, STOP PANTOPRAZOLE. Procedure Code(s):        --- Professional ---  61950, Esophagogastroduodenoscopy, flexible,                            transoral; diagnostic, including collection of                            specimen(s) by brushing or washing, when performed                            (separate procedure) Diagnosis Code(s):        --- Professional ---                           K21.00, Gastro-esophageal reflux disease with                            esophagitis, without bleeding CPT copyright 2019 American Medical Association. All rights reserved. The codes documented in this report are preliminary and upon coder review may  be revised to meet current compliance requirements. Maylon Peppers, MD Maylon Peppers,  11/21/2020 12:07:49 PM This report has been signed electronically. Number of Addenda: 0

## 2020-11-21 NOTE — Anesthesia Preprocedure Evaluation (Addendum)
Anesthesia Evaluation  Patient identified by MRN, date of birth, ID band Patient awake    Reviewed: Allergy & Precautions, NPO status , Patient's Chart, lab work & pertinent test results  History of Anesthesia Complications (+) PONV and history of anesthetic complications  Airway Mallampati: II  TM Distance: >3 FB Neck ROM: Full    Dental  (+) Dental Advisory Given, Partial Upper, Chipped, Missing, Poor Dentition,    Pulmonary Current SmokerPatient did not abstain from smoking.,    Pulmonary exam normal breath sounds clear to auscultation       Cardiovascular Exercise Tolerance: Good hypertension, Pt. on medications Normal cardiovascular exam Rhythm:Regular Rate:Normal     Neuro/Psych  Headaches, PSYCHIATRIC DISORDERS Anxiety Depression  Neuromuscular disease    GI/Hepatic Neg liver ROS, PUD, GERD  Medicated and Controlled,  Endo/Other  negative endocrine ROS  Renal/GU negative Renal ROS     Musculoskeletal  (+) Arthritis  (sciatica),   Abdominal   Peds  Hematology negative hematology ROS (+)   Anesthesia Other Findings   Reproductive/Obstetrics negative OB ROS                            Anesthesia Physical Anesthesia Plan  ASA: II  Anesthesia Plan: General   Post-op Pain Management:    Induction: Intravenous  PONV Risk Score and Plan: TIVA  Airway Management Planned: Nasal Cannula and Natural Airway  Additional Equipment:   Intra-op Plan:   Post-operative Plan:   Informed Consent: I have reviewed the patients History and Physical, chart, labs and discussed the procedure including the risks, benefits and alternatives for the proposed anesthesia with the patient or authorized representative who has indicated his/her understanding and acceptance.     Dental advisory given  Plan Discussed with: CRNA and Surgeon  Anesthesia Plan Comments:         Anesthesia Quick  Evaluation

## 2020-11-28 ENCOUNTER — Encounter (HOSPITAL_COMMUNITY): Payer: Self-pay | Admitting: Gastroenterology

## 2020-12-02 ENCOUNTER — Other Ambulatory Visit: Payer: Self-pay | Admitting: Adult Health

## 2020-12-15 ENCOUNTER — Other Ambulatory Visit (HOSPITAL_COMMUNITY): Payer: Self-pay | Admitting: Family Medicine

## 2020-12-15 DIAGNOSIS — M545 Low back pain, unspecified: Secondary | ICD-10-CM

## 2020-12-18 ENCOUNTER — Ambulatory Visit (HOSPITAL_COMMUNITY)
Admission: RE | Admit: 2020-12-18 | Discharge: 2020-12-18 | Disposition: A | Discharge status: Home / Self Care | Payer: Medicaid Other | Source: Ambulatory Visit | Attending: Family Medicine | Admitting: Family Medicine

## 2020-12-18 ENCOUNTER — Other Ambulatory Visit: Payer: Self-pay

## 2020-12-18 DIAGNOSIS — M545 Low back pain, unspecified: Secondary | ICD-10-CM | POA: Insufficient documentation

## 2020-12-25 DIAGNOSIS — M5432 Sciatica, left side: Secondary | ICD-10-CM | POA: Diagnosis not present

## 2020-12-25 DIAGNOSIS — G43909 Migraine, unspecified, not intractable, without status migrainosus: Secondary | ICD-10-CM | POA: Diagnosis not present

## 2020-12-25 DIAGNOSIS — F410 Panic disorder [episodic paroxysmal anxiety] without agoraphobia: Secondary | ICD-10-CM | POA: Diagnosis not present

## 2020-12-25 DIAGNOSIS — M5431 Sciatica, right side: Secondary | ICD-10-CM | POA: Diagnosis not present

## 2020-12-26 ENCOUNTER — Other Ambulatory Visit: Payer: Self-pay | Admitting: Family Medicine

## 2020-12-26 ENCOUNTER — Other Ambulatory Visit: Payer: Self-pay

## 2020-12-26 DIAGNOSIS — M5431 Sciatica, right side: Secondary | ICD-10-CM

## 2020-12-26 DIAGNOSIS — M5432 Sciatica, left side: Secondary | ICD-10-CM

## 2020-12-27 ENCOUNTER — Other Ambulatory Visit: Payer: Self-pay | Admitting: Obstetrics and Gynecology

## 2020-12-27 NOTE — Patient Instructions (Signed)
Hi Ms. Lechtenberg, sorry we missed you today.  Ms. MILAH RECHT  - as a part of your Medicaid benefit, you are eligible for care management and care coordination services at no cost or copay. I was unable to reach you by phone today but would be happy to help you with your health related needs. Please feel free to call me at [phone].   A member of the Managed Medicaid care management team will reach out to you again over the next 7 days.   Aida Raider RN, BSN [place]  [facility] [employer] - Managed Medicaid High Risk [phone].

## 2020-12-27 NOTE — Patient Outreach (Signed)
Care Coordination  12/27/2020  Debbie Santana 1991/08/18 [id number]    Medicaid Managed Care   Unsuccessful Outreach Note  12/27/2020 Name: Debbie Santana MRN: [id number] DOB: 1991/04/21  Referred by: Perlie Mayo, NP Reason for referral : High Risk Managed Medicaid (Unsuccessful telephone outreach)   An unsuccessful telephone outreach was attempted today. The patient was referred to the case management team for assistance with care management and care coordination.   Follow Up Plan: A member of the Managed  Medicaid care management team will reach out to the patient again over the next 7 days.   Aida Raider RN, BSN [place]  [facility] [employer] - Managed Medicaid High Risk [phone].

## 2020-12-28 ENCOUNTER — Ambulatory Visit (HOSPITAL_COMMUNITY)
Admission: RE | Admit: 2020-12-28 | Discharge: 2020-12-28 | Disposition: A | Discharge status: Home / Self Care | Payer: Medicaid Other | Source: Ambulatory Visit | Attending: Family Medicine | Admitting: Family Medicine

## 2020-12-28 DIAGNOSIS — M545 Low back pain, unspecified: Secondary | ICD-10-CM | POA: Diagnosis not present

## 2020-12-28 DIAGNOSIS — M5431 Sciatica, right side: Secondary | ICD-10-CM | POA: Insufficient documentation

## 2020-12-28 DIAGNOSIS — M5432 Sciatica, left side: Secondary | ICD-10-CM | POA: Insufficient documentation

## 2021-01-05 DIAGNOSIS — F410 Panic disorder [episodic paroxysmal anxiety] without agoraphobia: Secondary | ICD-10-CM | POA: Diagnosis not present

## 2021-01-05 DIAGNOSIS — F330 Major depressive disorder, recurrent, mild: Secondary | ICD-10-CM | POA: Diagnosis not present

## 2021-01-05 DIAGNOSIS — M5431 Sciatica, right side: Secondary | ICD-10-CM | POA: Diagnosis not present

## 2021-01-05 DIAGNOSIS — G43909 Migraine, unspecified, not intractable, without status migrainosus: Secondary | ICD-10-CM | POA: Diagnosis not present

## 2021-01-05 DIAGNOSIS — M5432 Sciatica, left side: Secondary | ICD-10-CM | POA: Diagnosis not present

## 2021-01-10 DIAGNOSIS — M5440 Lumbago with sciatica, unspecified side: Secondary | ICD-10-CM | POA: Diagnosis not present

## 2021-01-10 DIAGNOSIS — F330 Major depressive disorder, recurrent, mild: Secondary | ICD-10-CM | POA: Diagnosis not present

## 2021-01-10 DIAGNOSIS — Z79891 Long term (current) use of opiate analgesic: Secondary | ICD-10-CM | POA: Diagnosis not present

## 2021-01-11 DIAGNOSIS — I10 Essential (primary) hypertension: Secondary | ICD-10-CM | POA: Diagnosis not present

## 2021-01-11 DIAGNOSIS — R110 Nausea: Secondary | ICD-10-CM | POA: Diagnosis not present

## 2021-01-11 DIAGNOSIS — R1110 Vomiting, unspecified: Secondary | ICD-10-CM | POA: Diagnosis not present

## 2021-01-11 DIAGNOSIS — R1111 Vomiting without nausea: Secondary | ICD-10-CM | POA: Diagnosis not present

## 2021-01-11 DIAGNOSIS — R197 Diarrhea, unspecified: Secondary | ICD-10-CM | POA: Diagnosis not present

## 2021-01-11 DIAGNOSIS — R112 Nausea with vomiting, unspecified: Secondary | ICD-10-CM | POA: Diagnosis not present

## 2021-01-11 DIAGNOSIS — E860 Dehydration: Secondary | ICD-10-CM | POA: Diagnosis not present

## 2021-01-12 ENCOUNTER — Telehealth: Payer: Self-pay | Admitting: *Deleted

## 2021-01-12 NOTE — Telephone Encounter (Signed)
Transition Care Management Follow-up Telephone Call  Date of discharge and from where: 01/11/2021 [facility] ED  How have you been since you were released from the hospital? "Doing a little better"  Any questions or concerns? No  Items Reviewed:  Did the pt receive and understand the discharge instructions provided? Yes   Medications obtained and verified? Yes   Other? No   Any new allergies since your discharge? No  Dietary orders reviewed? No  Do you have support at home? Yes    Functional Questionnaire: (I = Independent and D = Dependent) ADLs: I  Bathing/Dressing- I  Meal Prep- I  Eating- I  Maintaining continence- I  Transferring/Ambulation- I  Managing Meds- I  Follow up appointments reviewed:   PCP Hospital f/u appt confirmed? No    Specialist Hospital f/u appt confirmed? No    Are transportation arrangements needed? N/A  If their condition worsens, is the pt aware to call PCP or go to the Emergency Dept.? Yes  Was the patient provided with contact information for the PCP's office or ED? Yes  Was to pt encouraged to call back with questions or concerns? Yes

## 2021-01-15 ENCOUNTER — Telehealth: Payer: Self-pay | Admitting: Family Medicine

## 2021-01-15 NOTE — Telephone Encounter (Signed)
Attempted to reach Ms.Heckman today to get her rescheduled for a phone visit with the [facility] RNCM. No one answered and I could not leave a message. Will attempt to reach her again in the next 7 days.

## 2021-02-07 ENCOUNTER — Other Ambulatory Visit: Payer: Self-pay | Admitting: Nurse Practitioner

## 2021-02-07 DIAGNOSIS — M545 Low back pain, unspecified: Secondary | ICD-10-CM

## 2021-02-07 DIAGNOSIS — F410 Panic disorder [episodic paroxysmal anxiety] without agoraphobia: Secondary | ICD-10-CM | POA: Diagnosis not present

## 2021-02-07 DIAGNOSIS — F330 Major depressive disorder, recurrent, mild: Secondary | ICD-10-CM | POA: Diagnosis not present

## 2021-02-07 DIAGNOSIS — Z79891 Long term (current) use of opiate analgesic: Secondary | ICD-10-CM | POA: Diagnosis not present

## 2021-02-20 ENCOUNTER — Other Ambulatory Visit (HOSPITAL_COMMUNITY)
Admission: RE | Admit: 2021-02-20 | Discharge: 2021-02-20 | Disposition: A | Discharge status: Home / Self Care | Payer: Medicaid Other | Attending: Family Medicine | Admitting: Family Medicine

## 2021-02-20 ENCOUNTER — Ambulatory Visit (HOSPITAL_COMMUNITY)
Admission: RE | Admit: 2021-02-20 | Discharge: 2021-02-20 | Disposition: A | Discharge status: Home / Self Care | Payer: Medicaid Other | Source: Ambulatory Visit | Attending: Nurse Practitioner | Admitting: Nurse Practitioner

## 2021-02-20 ENCOUNTER — Other Ambulatory Visit (HOSPITAL_COMMUNITY): Payer: Self-pay | Admitting: Nurse Practitioner

## 2021-02-20 DIAGNOSIS — M7989 Other specified soft tissue disorders: Secondary | ICD-10-CM | POA: Diagnosis not present

## 2021-02-20 DIAGNOSIS — Z79891 Long term (current) use of opiate analgesic: Secondary | ICD-10-CM | POA: Insufficient documentation

## 2021-02-20 DIAGNOSIS — M79671 Pain in right foot: Secondary | ICD-10-CM

## 2021-02-20 DIAGNOSIS — M545 Low back pain, unspecified: Secondary | ICD-10-CM | POA: Diagnosis not present

## 2021-02-20 DIAGNOSIS — M7731 Calcaneal spur, right foot: Secondary | ICD-10-CM | POA: Diagnosis not present

## 2021-02-20 DIAGNOSIS — F330 Major depressive disorder, recurrent, mild: Secondary | ICD-10-CM | POA: Diagnosis not present

## 2021-02-20 LAB — RAPID URINE DRUG SCREEN, HOSP PERFORMED
Amphetamines: NOT DETECTED
Barbiturates: NOT DETECTED
Benzodiazepines: POSITIVE — AB
Cocaine: POSITIVE — AB
Opiates: NOT DETECTED
Tetrahydrocannabinol: POSITIVE — AB

## 2021-03-04 ENCOUNTER — Other Ambulatory Visit: Payer: Self-pay | Admitting: Adult Health

## 2021-03-27 ENCOUNTER — Other Ambulatory Visit (INDEPENDENT_AMBULATORY_CARE_PROVIDER_SITE_OTHER): Payer: Self-pay

## 2021-03-27 DIAGNOSIS — R1013 Epigastric pain: Secondary | ICD-10-CM

## 2021-03-27 DIAGNOSIS — K2100 Gastro-esophageal reflux disease with esophagitis, without bleeding: Secondary | ICD-10-CM

## 2021-03-27 MED ORDER — SUCRALFATE 1 G PO TABS: 1.0000 g | ORAL_TABLET | Freq: Three times a day (TID) | ORAL | 3 refills | Status: DC

## 2021-03-27 NOTE — Progress Notes (Signed)
Medication refilled

## 2021-04-03 DIAGNOSIS — M545 Low back pain, unspecified: Secondary | ICD-10-CM | POA: Diagnosis not present

## 2021-04-03 DIAGNOSIS — F330 Major depressive disorder, recurrent, mild: Secondary | ICD-10-CM | POA: Diagnosis not present

## 2021-04-03 DIAGNOSIS — M722 Plantar fascial fibromatosis: Secondary | ICD-10-CM | POA: Diagnosis not present

## 2021-04-03 DIAGNOSIS — Z8711 Personal history of peptic ulcer disease: Secondary | ICD-10-CM | POA: Diagnosis not present

## 2021-04-10 ENCOUNTER — Ambulatory Visit (HOSPITAL_COMMUNITY): Payer: Medicaid Other | Admitting: Physical Therapy

## 2021-04-30 ENCOUNTER — Encounter (HOSPITAL_COMMUNITY): Payer: Self-pay

## 2021-04-30 ENCOUNTER — Other Ambulatory Visit: Payer: Self-pay

## 2021-04-30 ENCOUNTER — Ambulatory Visit (HOSPITAL_COMMUNITY): Payer: Medicaid Other | Attending: Nurse Practitioner

## 2021-04-30 DIAGNOSIS — M6281 Muscle weakness (generalized): Secondary | ICD-10-CM | POA: Insufficient documentation

## 2021-04-30 DIAGNOSIS — M545 Low back pain, unspecified: Secondary | ICD-10-CM | POA: Insufficient documentation

## 2021-04-30 DIAGNOSIS — G8929 Other chronic pain: Secondary | ICD-10-CM | POA: Insufficient documentation

## 2021-04-30 DIAGNOSIS — R29898 Other symptoms and signs involving the musculoskeletal system: Secondary | ICD-10-CM | POA: Diagnosis present

## 2021-04-30 NOTE — Therapy (Signed)
[place], [place], [address] Phone: [phone]   Fax:  [phone]  Physical Therapy Evaluation  Patient Details  Name: Debbie Santana MRN: [id number] Date of Birth: December 05, 1990 Referring Provider (PT): Carlis Abbott, [facility]   Encounter Date: 04/30/2021   PT End of Session - 04/30/21 1518     Visit Number 1    Number of Visits 6    Date for PT Re-Evaluation 06/11/21    Authorization Type [place] Medicaid Healthy Blue, check auth    Progress Note Due on Visit 6    PT Start Time 1430    PT Stop Time 1515    PT Time Calculation (min) 45 min    Activity Tolerance Patient limited by pain    Behavior During Therapy [facility] for tasks assessed/performed             Past Medical History:  Diagnosis Date   Anxiety    Bradycardia 04/17/2017   Chest pain on breathing 02/22/2020   Chronic back pain    Depression    Erosive gastritis 04/11/2017   Gastric ulcer    Genital warts    History of gestational hypertension 05/21/2018   Dx intrapartum   History of kidney stones    Intertrigo 11/16/2019   Intractable abdominal pain 04/11/2017   Intractable vomiting 05/20/2020   Irregular intermenstrual bleeding 02/19/2019   Mental disorder    Migraine without aura and without status migrainosus, not intractable 02/19/2019   Nausea 05/10/2020   Nausea & vomiting 12/07/2018   Nexplanon in place 02/19/2019   Obesity 04/17/2017   Peptic ulcer disease 04/13/2017   PONV (postoperative nausea and vomiting)    Postpartum hypertension 06/23/2018   requiring norvasc, resolved at pp visit   Sciatica    Vaginal Pap smear, abnormal     Past Surgical History:  Procedure Laterality Date   BACK SURGERY     ruptured disc.   BIOPSY  04/14/2017   Procedure: BIOPSY;  Surgeon: Rogene Houston, MD;  [facility];  Service: Endoscopy;;  gastric   BIOPSY  05/22/2020   Procedure: BIOPSY;  Surgeon: Daneil Dolin, MD;  [facility];   Service: Endoscopy;;   BIOPSY  08/29/2020   Procedure: BIOPSY;  Surgeon: Harvel Quale, MD;  [facility];  Service: Gastroenterology;;   BIOPSY  09/09/2020   Procedure: BIOPSY;  Surgeon: Eloise Harman, DO;  [facility];  Service: Endoscopy;;  duodenum;antral   CERVICAL ABLATION N/A 12/02/2018   Procedure: LASER ABLATION OF CERVIX;  Surgeon: Florian Buff, MD;  [facility]: [facility];  Service: Gynecology;  Laterality: N/A;   CHOLECYSTECTOMY N/A 04/17/2017   Procedure: LAPAROSCOPIC CHOLECYSTECTOMY;  Surgeon: Aviva Signs, MD;  [facility]: [facility];  Service: General;  Laterality: N/A;   COLONOSCOPY WITH PROPOFOL N/A 08/29/2020   Procedure: COLONOSCOPY WITH PROPOFOL;  Surgeon: Harvel Quale, MD;  [facility];  Service: Gastroenterology;  Laterality: N/A;  11:15   COLPOSCOPY     ESOPHAGOGASTRODUODENOSCOPY (EGD) WITH PROPOFOL N/A 04/14/2017   Procedure: ESOPHAGOGASTRODUODENOSCOPY (EGD) WITH PROPOFOL;  Surgeon: Rogene Houston, MD;  [facility];  Service: Endoscopy;  Laterality: N/A;   ESOPHAGOGASTRODUODENOSCOPY (EGD) WITH PROPOFOL N/A 05/22/2020   Procedure: ESOPHAGOGASTRODUODENOSCOPY (EGD) WITH PROPOFOL;  Surgeon: Daneil Dolin, MD;  [facility];  Service: Endoscopy;  Laterality: N/A;   ESOPHAGOGASTRODUODENOSCOPY (EGD) WITH PROPOFOL N/A 09/09/2020   Procedure: ESOPHAGOGASTRODUODENOSCOPY (EGD)  WITH PROPOFOL;  Surgeon: Eloise Harman, DO;  [facility];  Service: Endoscopy;  Laterality: N/A;   ESOPHAGOGASTRODUODENOSCOPY (EGD) WITH PROPOFOL N/A 11/21/2020   Procedure: ESOPHAGOGASTRODUODENOSCOPY (EGD) WITH PROPOFOL;  Surgeon: Harvel Quale, MD;  [facility];  Service: Gastroenterology;  Laterality: N/A;  Am   HERNIA REPAIR Bilateral    inguinal   LASER ABLATION CONDOLAMATA N/A 12/02/2018   Procedure: LASER ABLATION CONDYLOMA ACCUMINATA LEFT AND RIGHT VULVA, PERINEUM AND PERIANAL (15 TOTAL);   Surgeon: Florian Buff, MD;  [facility];  Service: Gynecology;  Laterality: N/A;   POLYPECTOMY  08/29/2020   Procedure: POLYPECTOMY;  Surgeon: Harvel Quale, MD;  [facility];  Service: Gastroenterology;;    There were no vitals filed for this visit.    Subjective Assessment - 04/30/21 1437     Subjective Pt with chronic LBP and BLE pain. Of note pt had lumbar surgery when she was 30 yo and has been experiencing worse back and bilateral buttock and BLE pain over the past few years with more symptoms in her RLE vs LLE    Limitations Sitting;Standing;Walking;House hold activities;Lifting    How long can you sit comfortably? "after an hour"    How long can you stand comfortably? "maybe an hour"    Diagnostic tests recent MRI which reveals bulging discs and pinched nerve    Currently in Pain? Yes    Pain Score 6     Pain Location Back    Pain Orientation Lower    Pain Descriptors / Indicators Aching;Shooting    Pain Type Chronic pain    Pain Radiating Towards BLE, RLE>LLE    Pain Onset More than a month ago    Pain Frequency Intermittent    Aggravating Factors  bending over, prolonged periods on feet    Pain Relieving Factors sidelying                [facility] PT Assessment - 04/30/21 0001       Assessment   Medical Diagnosis back pain    Referring Provider (PT) Carlis Abbott, [facility]      Balance Screen   Has the patient fallen in the past 6 months Yes    How many times? 5    Has the patient had a decrease in activity level because of a fear of falling?  No    Is the patient reluctant to leave their home because of a fear of falling?  No      Prior Function   Level of Independence Independent    Vocation Unemployed    Leisure pt is mother of 45 year old      Observation/Other Assessments   Observations slumped sitting, poor posture      Functional Tests   Functional tests Other      Other:   Other/ Comments poor lifting mechanics  demonstrated      ROM / Strength   AROM / PROM / Strength AROM;Strength      AROM   AROM Assessment Site Lumbar    Lumbar Flexion 25% limited    Lumbar Extension 10% limited      Strength   Overall Strength Comments BLE 5/5    Strength Assessment Site Lumbar    Lumbar Flexion 2+/5    Lumbar Extension 2+/5      Palpation   Palpation comment unrevealing      Special Tests    Special Tests Lumbar  Lumbar Tests Straight Leg Raise;Slump Test      Slump test   Findings Positive    Side Right      Straight Leg Raise   Findings Positive    Side  Right                        Objective measurements completed on examination: See above findings.       [facility] Adult PT Treatment/Exercise - 04/30/21 0001       Exercises   Exercises Lumbar      Lumbar Exercises: Prone   Other Prone Lumbar Exercises prone x 5 min, prone on elbows 2x10                    PT Education - 04/30/21 1517     Education Details education/discussion on lumbar disc mechanics    Person(s) Educated Patient    Methods Explanation;Demonstration;Handout    Comprehension Verbalized understanding              PT Short Term Goals - 04/30/21 1523       PT SHORT TERM GOAL #1   Title Patient will be independent with HEP in order to improve functional outcomes.    Time 3    Period Weeks    Status New    Target Date 05/21/21      PT SHORT TERM GOAL #2   Title Patient will report at least 25% improvement in symptoms for improved quality of life.    Time 3    Period Weeks    Status New    Target Date 05/21/21      PT SHORT TERM GOAL #3   Title Demonstrate improved centralization of symptoms as evidenced by negative RLE SLR and/or sitting slump    Baseline positive for peripheral symptoms    Time 3    Period Weeks    Status New    Target Date 05/21/21               PT Long Term Goals - 04/30/21 1525       PT LONG TERM GOAL #1   Title Demonstrate proper  lifting techniuqes to reduce risk for re-injury to low back    Baseline poor lifting mechanics    Time 6    Period Weeks    Status New    Target Date 06/11/21      PT LONG TERM GOAL #2   Title Demo 3+/5 trunk flexion/extension to improve stability    Baseline 2+/5    Time 6    Period Weeks    Status New    Target Date 06/11/21                    Plan - 04/30/21 1519     Clinical Impression Statement 30 yo lady with chronic hx of LBP and radiating BLE pain which is worse in RLE vs LLE.  Pt notes increased pain and decreased activity tolerance over the past 2 years and notes increased difficulty with day-to-day housekeeping and caretaking of her child.  Pt demonstrates spinal ROM deficits, pain with provocation tests/positions, decrease in core/trunk strength, reduced functional activity tolerance, pain with ADL/IADL. Pt would benefit from skilled services to improve spinal ROM, general strength, and improve body mechanics to reduce risk for re-injury    Personal Factors and Comorbidities Comorbidity 1;Time since onset of injury/illness/exacerbation    Comorbidities hx of lumbar surgery,  Tobacco abuse    Examination-Activity Limitations Bend;Caring for Others;Carry;Lift;Stand;Stairs;Squat;Reach Overhead;Locomotion Level;Transfers    Examination-Participation Restrictions Cleaning;Community Activity;Driving;Yard Work;Occupation;Meal Prep    Stability/Clinical Decision Making Stable/Uncomplicated    Clinical Decision Making Low    Rehab Potential Fair    PT Frequency 1x / week    PT Duration 6 weeks    PT Treatment/Interventions ADLs/Self Care Home Management;Aquatic Therapy;Electrical Stimulation;DME Instruction;Traction;Gait training;Stair training;Functional mobility training;Therapeutic activities;Therapeutic exercise;Balance training;Patient/family education;Neuromuscular re-education;Manual techniques;Taping;Dry needling;Spinal Manipulations;Joint Manipulations    PT Next  Visit Plan assess for extension bias improvements, core stabilization    PT Home Exercise Plan prone, prone on elbows, standing extension    Consulted and Agree with Plan of Care Patient             Patient will benefit from skilled therapeutic intervention in order to improve the following deficits and impairments:  Decreased activity tolerance, Decreased range of motion, Decreased strength, Impaired perceived functional ability, Improper body mechanics, Pain  Visit Diagnosis: Muscle weakness (generalized)  Chronic low back pain, unspecified back pain laterality, unspecified whether sciatica present  Other symptoms and signs involving the musculoskeletal system     Problem List Patient Active Problem List   Diagnosis Date Noted   Atypical squamous cells of undetermined significance on cytologic smear of cervix (ASC-US) 10/31/2020   High grade squamous intraepithelial cervical dysplasia, S/P cervical conization 10/31/2020   IBS (irritable bowel syndrome) 10/30/2020   Encounter for support and coordination of transition of care 09/14/2020   Cannabis hyperemesis syndrome concurrent with and due to cannabis abuse ([place]) 09/09/2020   Chronic abdominal pain    Chronic radicular lumbar pain 08/23/2020   Chronic pain syndrome 08/23/2020   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) 08/23/2020   Post laminectomy syndrome 08/23/2020   Frequent headaches 08/10/2020   Nexplanon in place 08/10/2020   Irregular intermenstrual bleeding 08/10/2020   Obesity (BMI 30-39.9) 05/20/2020   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   Panic attack 05/18/2020   Abdominal pain, chronic, epigastric 05/18/2020   Agitated 05/18/2020   Gastroesophageal reflux disease 05/10/2020   Alternating constipation and diarrhea 05/10/2020   Bilateral wheezing 02/22/2020   Sleep disturbance 02/22/2020   Anxiety and depression 02/19/2019   Intractable nausea and vomiting 12/07/2018   Low back pain  12/07/2018   Chronic diarrhea 12/07/2018   Condyloma 04/15/2018   History of abnormal cervical Pap smear 11/04/2017   Reflux esophagitis 04/17/2017   Tobacco abuse 04/17/2017   Peptic ulcer disease 04/13/2017   Erosive gastritis 04/11/2017   Anxiety 04/11/2017   3:27 PM, 04/30/21 M. Sherlyn Lees, PT, DPT Physical Therapist- [place] Office Number: [phone]   [place] [address] [place], [place], [address] Phone: [phone]   Fax:  [phone]  Name: Debbie Santana MRN: [id number] Date of Birth: Dec 15, 1990

## 2021-04-30 NOTE — Patient Instructions (Signed)
Access Code: [email or web address] URL: [email or web address] Date: 04/30/2021 Prepared by: Sherlyn Lees  Exercises Lying Prone with 1 Pillow - 1 x daily - 7 x weekly Prone Press Up On Elbows - 1 x daily - 7 x weekly - 3 sets - 10 reps Standing Lumbar Extension - 1 x daily - 7 x weekly - 3 sets - 10 reps

## 2021-05-16 ENCOUNTER — Ambulatory Visit (HOSPITAL_COMMUNITY): Payer: Medicaid Other

## 2021-05-23 ENCOUNTER — Ambulatory Visit (HOSPITAL_COMMUNITY): Payer: Medicaid Other | Admitting: Physical Therapy

## 2021-05-23 ENCOUNTER — Other Ambulatory Visit: Payer: Self-pay

## 2021-05-23 DIAGNOSIS — M6281 Muscle weakness (generalized): Secondary | ICD-10-CM | POA: Diagnosis not present

## 2021-05-23 DIAGNOSIS — M545 Low back pain, unspecified: Secondary | ICD-10-CM

## 2021-05-23 DIAGNOSIS — R29898 Other symptoms and signs involving the musculoskeletal system: Secondary | ICD-10-CM

## 2021-05-23 NOTE — Therapy (Signed)
[place], [place], [address] Phone: [phone]   Fax:  [phone]  Physical Therapy Treatment  Patient Details  Name: Debbie Santana MRN: [id number] Date of Birth: [birth year] Referring Provider (PT): Carlis Abbott, [facility]   Encounter Date: 05/23/2021   PT End of Session - 05/23/21 1720     Visit Number 2    Number of Visits 6    Date for PT Re-Evaluation 06/11/21    Authorization Type [place] Medicaid [employer] approved 6 visits 8/8-9/19    Authorization - Visit Number 2    Authorization - Number of Visits 6    Progress Note Due on Visit 6    PT Start Time 1450    PT Stop Time 1530    PT Time Calculation (min) 40 min    Activity Tolerance Patient limited by pain    Behavior During Therapy [facility] for tasks assessed/performed             Past Medical History:  Diagnosis Date   Anxiety    Bradycardia 04/17/2017   Chest pain on breathing 02/22/2020   Chronic back pain    Depression    Erosive gastritis 04/11/2017   Gastric ulcer    Genital warts    History of gestational hypertension 05/21/2018   Dx intrapartum   History of kidney stones    Intertrigo 11/16/2019   Intractable abdominal pain 04/11/2017   Intractable vomiting 05/20/2020   Irregular intermenstrual bleeding 02/19/2019   Mental disorder    Migraine without aura and without status migrainosus, not intractable 02/19/2019   Nausea 05/10/2020   Nausea & vomiting 12/07/2018   Nexplanon in place 02/19/2019   Obesity 04/17/2017   Peptic ulcer disease 04/13/2017   PONV (postoperative nausea and vomiting)    Postpartum hypertension 06/23/2018   requiring norvasc, resolved at pp visit   Sciatica    Vaginal Pap smear, abnormal     Past Surgical History:  Procedure Laterality Date   BACK SURGERY     ruptured disc.   BIOPSY  04/14/2017   Procedure: BIOPSY;  Surgeon: Rogene Houston, MD;  [facility];  Service: Endoscopy;;  gastric   BIOPSY   05/22/2020   Procedure: BIOPSY;  Surgeon: Daneil Dolin, MD;  [facility];  Service: Endoscopy;;   BIOPSY  08/29/2020   Procedure: BIOPSY;  Surgeon: Harvel Quale, MD;  [facility];  Service: Gastroenterology;;   BIOPSY  09/09/2020   Procedure: BIOPSY;  Surgeon: Eloise Harman, DO;  [facility];  Service: Endoscopy;;  duodenum;antral   CERVICAL ABLATION N/A 12/02/2018   Procedure: LASER ABLATION OF CERVIX;  Surgeon: Florian Buff, MD;  [facility];  Service: Gynecology;  Laterality: N/A;   CHOLECYSTECTOMY N/A 04/17/2017   Procedure: LAPAROSCOPIC CHOLECYSTECTOMY;  Surgeon: Aviva Signs, MD;  [facility];  Service: General;  Laterality: N/A;   COLONOSCOPY WITH PROPOFOL N/A 08/29/2020   Procedure: COLONOSCOPY WITH PROPOFOL;  Surgeon: Harvel Quale, MD;  [facility];  Service: Gastroenterology;  Laterality: N/A;  11:15   COLPOSCOPY     ESOPHAGOGASTRODUODENOSCOPY (EGD) WITH PROPOFOL N/A 04/14/2017   Procedure: ESOPHAGOGASTRODUODENOSCOPY (EGD) WITH PROPOFOL;  Surgeon: Rogene Houston, MD;  [facility];  Service: Endoscopy;  Laterality: N/A;   ESOPHAGOGASTRODUODENOSCOPY (EGD) WITH PROPOFOL N/A 05/22/2020   Procedure: ESOPHAGOGASTRODUODENOSCOPY (EGD) WITH PROPOFOL;  Surgeon: Daneil Dolin, MD;  [facility];  Service: Endoscopy;  Laterality: N/A;   ESOPHAGOGASTRODUODENOSCOPY (EGD) WITH PROPOFOL N/A 09/09/2020   Procedure: ESOPHAGOGASTRODUODENOSCOPY (EGD) WITH PROPOFOL;  Surgeon: Eloise Harman, DO;  [facility];  Service: Endoscopy;  Laterality: N/A;   ESOPHAGOGASTRODUODENOSCOPY (EGD) WITH PROPOFOL N/A 11/21/2020   Procedure: ESOPHAGOGASTRODUODENOSCOPY (EGD) WITH PROPOFOL;  Surgeon: Harvel Quale, MD;  [facility];  Service: Gastroenterology;  Laterality: N/A;  Am   HERNIA REPAIR Bilateral    inguinal   LASER ABLATION CONDOLAMATA N/A 12/02/2018   Procedure:  LASER ABLATION CONDYLOMA ACCUMINATA LEFT AND RIGHT VULVA, PERINEUM AND PERIANAL (15 TOTAL);  Surgeon: Florian Buff, MD;  [facility];  Service: Gynecology;  Laterality: N/A;   POLYPECTOMY  08/29/2020   Procedure: POLYPECTOMY;  Surgeon: Harvel Quale, MD;  [facility];  Service: Gastroenterology;;    There were no vitals filed for this visit.   Subjective Assessment - 05/23/21 1452     Subjective Pt returns today after missing past 3 weeks of therapy.  Pt states the LBP has been really bad but not as bad today at 6/10.  STates 6/10 is the lowest and she is never painfree.  Pt states she did not do the 2 exercises given as HEP because she had pain for a whole week later after doing these.    Currently in Pain? Yes    Pain Score 6     Pain Location Back    Pain Orientation Lower    Pain Descriptors / Indicators Shooting;Aching    Pain Type Chronic pain                               [facility] Adult PT Treatment/Exercise - 05/23/21 0001       Lumbar Exercises: Stretches   Standing Extension 10 reps      Lumbar Exercises: Seated   Sit to Stand 10 reps      Lumbar Exercises: Supine   Ab Set 10 reps;5 seconds    Glut Set 10 reps    Bridge 10 reps    Bridge Limitations 1/2 ROM, slow and controlled    Straight Leg Raise 10 reps    Straight Leg Raises Limitations with abdominal bracing, slow and controlled      Lumbar Exercises: Prone   Other Prone Lumbar Exercises heelqueezes 10X5"    Other Prone Lumbar Exercises prone lying                      PT Short Term Goals - 05/23/21 1524       PT SHORT TERM GOAL #1   Title Patient will be independent with HEP in order to improve functional outcomes.    Time 3    Period Weeks    Status On-going    Target Date 05/21/21      PT SHORT TERM GOAL #2   Title Patient will report at least 25% improvement in symptoms for improved quality of life.    Time 3    Period Weeks     Status On-going    Target Date 05/21/21      PT SHORT TERM GOAL #3   Title Demonstrate improved centralization of symptoms as evidenced by negative RLE SLR and/or sitting slump    Baseline positive for peripheral symptoms    Time 3    Period Weeks    Status On-going    Target Date 05/21/21  PT Long Term Goals - 05/23/21 1524       PT LONG TERM GOAL #1   Title Demonstrate proper lifting techniuqes to reduce risk for re-injury to low back    Baseline poor lifting mechanics    Time 6    Period Weeks    Status On-going      PT LONG TERM GOAL #2   Title Demo 3+/5 trunk flexion/extension to improve stability    Baseline 2+/5    Time 6    Period Weeks    Status On-going                   Plan - 05/23/21 1714     Clinical Impression Statement PT returns today following 3 weeks of missed appts due to GI issues.  Reviewed goals and POC moving forward. Pt instructed to discontnue the POE for now since this irritated her back.  Standing extensions did not have any affect on pain.  Worked on core strengthening, gentle isometrics and stretching this session.  Pt encouraged to only lift minimally but to focus more on the contraction/completing slowly and controlled. Pt able to complete all added therex this session without complaints of pain or issues.  Pt given isometric abdominals and glutes for HEP until returns. Pt reported increase in pain at EOS, however did not appear to be in distress during or at EOS.    Personal Factors and Comorbidities Comorbidity 1;Time since onset of injury/illness/exacerbation    Comorbidities hx of lumbar surgery, Tobacco abuse    Examination-Activity Limitations Bend;Caring for Others;Carry;Lift;Stand;Stairs;Squat;Reach Overhead;Locomotion Level;Transfers    Examination-Participation Restrictions Cleaning;Community Activity;Driving;Yard Work;Occupation;Meal Prep    Stability/Clinical Decision Making Stable/Uncomplicated    Rehab  Potential Fair    PT Frequency 1x / week    PT Duration 6 weeks    PT Treatment/Interventions ADLs/Self Care Home Management;Aquatic Therapy;Electrical Stimulation;DME Instruction;Traction;Gait training;Stair training;Functional mobility training;Therapeutic activities;Therapeutic exercise;Balance training;Patient/family education;Neuromuscular re-education;Manual techniques;Taping;Dry needling;Spinal Manipulations;Joint Manipulations    PT Next Visit Plan FU on changing HEP to isometric exercises.  Continue with core stabilization.  May benefit from decompression exericses.    PT Home Exercise Plan prone, prone on elbows, standing extension   8/31:  discontinue POE add abdominal bracing and glute sets    Consulted and Agree with Plan of Care Patient             Patient will benefit from skilled therapeutic intervention in order to improve the following deficits and impairments:  Decreased activity tolerance, Decreased range of motion, Decreased strength, Impaired perceived functional ability, Improper body mechanics, Pain  Visit Diagnosis: Muscle weakness (generalized)  Chronic low back pain, unspecified back pain laterality, unspecified whether sciatica present  Other symptoms and signs involving the musculoskeletal system     Problem List Patient Active Problem List   Diagnosis Date Noted   Atypical squamous cells of undetermined significance on cytologic smear of cervix (ASC-US) 10/31/2020   High grade squamous intraepithelial cervical dysplasia, S/P cervical conization 10/31/2020   IBS (irritable bowel syndrome) 10/30/2020   Encounter for support and coordination of transition of care 09/14/2020   Cannabis hyperemesis syndrome concurrent with and due to cannabis abuse ([place]) 09/09/2020   Chronic abdominal pain    Chronic radicular lumbar pain 08/23/2020   Chronic pain syndrome 08/23/2020   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) 08/23/2020   Post laminectomy  syndrome 08/23/2020   Frequent headaches 08/10/2020   Nexplanon in place 08/10/2020   Irregular intermenstrual bleeding 08/10/2020  Obesity (BMI 30-39.9) 05/20/2020   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   Panic attack 05/18/2020   Abdominal pain, chronic, epigastric 05/18/2020   Agitated 05/18/2020   Gastroesophageal reflux disease 05/10/2020   Alternating constipation and diarrhea 05/10/2020   Bilateral wheezing 02/22/2020   Sleep disturbance 02/22/2020   Anxiety and depression 02/19/2019   Intractable nausea and vomiting 12/07/2018   Low back pain 12/07/2018   Chronic diarrhea 12/07/2018   Condyloma 04/15/2018   History of abnormal cervical Pap smear 11/04/2017   Reflux esophagitis 04/17/2017   Tobacco abuse 04/17/2017   Peptic ulcer disease 04/13/2017   Erosive gastritis 04/11/2017   Anxiety 04/11/2017   Teena Irani, PTA/CLT [phone]  Teena Irani 05/23/2021, 5:28 PM  [facility] [address] [place], [place], [address] Phone: [phone]   Fax:  [phone]  Name: Debbie Santana MRN: [id number] Date of Birth: 1991-03-23

## 2021-05-23 NOTE — Patient Instructions (Signed)
Gluteal Sets    Squeeze pelvic floor and hold. Tighten bottom. Hold for _5__ seconds. Relax for __5_ seconds. Repeat _10__ times. Do __2_ times a day.    Abdominal Bracing With Pelvic Floor (Hook-Lying)    With neutral spine, tighten pelvic floor and abdominals. Repeat _10__ times. Hold_5_ seconds.  Do __2_ times a day.   Copyright  VHI. All rights reserved.

## 2021-05-30 ENCOUNTER — Other Ambulatory Visit: Payer: Self-pay

## 2021-05-30 ENCOUNTER — Ambulatory Visit (HOSPITAL_COMMUNITY): Payer: Medicaid Other | Attending: Nurse Practitioner

## 2021-05-30 ENCOUNTER — Encounter (HOSPITAL_COMMUNITY): Payer: Self-pay

## 2021-05-30 DIAGNOSIS — M545 Low back pain, unspecified: Secondary | ICD-10-CM | POA: Diagnosis not present

## 2021-05-30 DIAGNOSIS — G8929 Other chronic pain: Secondary | ICD-10-CM | POA: Insufficient documentation

## 2021-05-30 DIAGNOSIS — R29898 Other symptoms and signs involving the musculoskeletal system: Secondary | ICD-10-CM | POA: Insufficient documentation

## 2021-05-30 DIAGNOSIS — M6281 Muscle weakness (generalized): Secondary | ICD-10-CM | POA: Insufficient documentation

## 2021-05-30 NOTE — Therapy (Signed)
[place], [place], [address] Phone: [phone]   Fax:  [phone]  Physical Therapy Treatment  Patient Details  Name: Debbie Santana MRN: [id number] Date of Birth: [birth year] Referring Provider (PT): Carlis Abbott, [facility]   Encounter Date: 05/30/2021   PT End of Session - 05/30/21 1349     Visit Number 3    Number of Visits 6    Date for PT Re-Evaluation 06/11/21    Authorization Type [place] Medicaid [employer] approved 6 visits 8/8-9/19    Authorization - Visit Number 3    Authorization - Number of Visits 6    Progress Note Due on Visit 6    PT Start Time 1346    PT Stop Time 1425    PT Time Calculation (min) 39 min    Activity Tolerance Patient tolerated treatment well    Behavior During Therapy [facility] for tasks assessed/performed             Past Medical History:  Diagnosis Date   Anxiety    Bradycardia 04/17/2017   Chest pain on breathing 02/22/2020   Chronic back pain    Depression    Erosive gastritis 04/11/2017   Gastric ulcer    Genital warts    History of gestational hypertension 05/21/2018   Dx intrapartum   History of kidney stones    Intertrigo 11/16/2019   Intractable abdominal pain 04/11/2017   Intractable vomiting 05/20/2020   Irregular intermenstrual bleeding 02/19/2019   Mental disorder    Migraine without aura and without status migrainosus, not intractable 02/19/2019   Nausea 05/10/2020   Nausea & vomiting 12/07/2018   Nexplanon in place 02/19/2019   Obesity 04/17/2017   Peptic ulcer disease 04/13/2017   PONV (postoperative nausea and vomiting)    Postpartum hypertension 06/23/2018   requiring norvasc, resolved at pp visit   Sciatica    Vaginal Pap smear, abnormal     Past Surgical History:  Procedure Laterality Date   BACK SURGERY     ruptured disc.   BIOPSY  04/14/2017   Procedure: BIOPSY;  Surgeon: Rogene Houston, MD;  [facility];  Service: Endoscopy;;  gastric    BIOPSY  05/22/2020   Procedure: BIOPSY;  Surgeon: Daneil Dolin, MD;  [facility];  Service: Endoscopy;;   BIOPSY  08/29/2020   Procedure: BIOPSY;  Surgeon: Harvel Quale, MD;  [facility];  Service: Gastroenterology;;   BIOPSY  09/09/2020   Procedure: BIOPSY;  Surgeon: Eloise Harman, DO;  [facility];  Service: Endoscopy;;  duodenum;antral   CERVICAL ABLATION N/A 12/02/2018   Procedure: LASER ABLATION OF CERVIX;  Surgeon: Florian Buff, MD;  [facility];  Service: Gynecology;  Laterality: N/A;   CHOLECYSTECTOMY N/A 04/17/2017   Procedure: LAPAROSCOPIC CHOLECYSTECTOMY;  Surgeon: Aviva Signs, MD;  [facility]: [facility];  Service: General;  Laterality: N/A;   COLONOSCOPY WITH PROPOFOL N/A 08/29/2020   Procedure: COLONOSCOPY WITH PROPOFOL;  Surgeon: Harvel Quale, MD;  [facility];  Service: Gastroenterology;  Laterality: N/A;  11:15   COLPOSCOPY     ESOPHAGOGASTRODUODENOSCOPY (EGD) WITH PROPOFOL N/A 04/14/2017   Procedure: ESOPHAGOGASTRODUODENOSCOPY (EGD) WITH PROPOFOL;  Surgeon: Rogene Houston, MD;  [facility];  Service: Endoscopy;  Laterality: N/A;   ESOPHAGOGASTRODUODENOSCOPY (EGD) WITH PROPOFOL N/A 05/22/2020   Procedure: ESOPHAGOGASTRODUODENOSCOPY (EGD) WITH PROPOFOL;  Surgeon: Daneil Dolin, MD;  [facility];  Service: Endoscopy;  Laterality: N/A;   ESOPHAGOGASTRODUODENOSCOPY (EGD) WITH PROPOFOL N/A 09/09/2020   Procedure: ESOPHAGOGASTRODUODENOSCOPY (EGD) WITH PROPOFOL;  Surgeon: Eloise Harman, DO;  [facility];  Service: Endoscopy;  Laterality: N/A;   ESOPHAGOGASTRODUODENOSCOPY (EGD) WITH PROPOFOL N/A 11/21/2020   Procedure: ESOPHAGOGASTRODUODENOSCOPY (EGD) WITH PROPOFOL;  Surgeon: Harvel Quale, MD;  [facility];  Service: Gastroenterology;  Laterality: N/A;  Am   HERNIA REPAIR Bilateral    inguinal   LASER ABLATION CONDOLAMATA N/A 12/02/2018    Procedure: LASER ABLATION CONDYLOMA ACCUMINATA LEFT AND RIGHT VULVA, PERINEUM AND PERIANAL (15 TOTAL);  Surgeon: Florian Buff, MD;  [facility];  Service: Gynecology;  Laterality: N/A;   POLYPECTOMY  08/29/2020   Procedure: POLYPECTOMY;  Surgeon: Harvel Quale, MD;  [facility];  Service: Gastroenterology;;    There were no vitals filed for this visit.   Subjective Assessment - 05/30/21 1348     Subjective Pt reports not a lot of pain today, hasn't been able to do exercises due to having a lot going on with toddler.    Limitations Sitting;Standing;Walking;House hold activities;Lifting    How long can you sit comfortably? "after an hour"    How long can you stand comfortably? "maybe an hour"    Diagnostic tests recent MRI which reveals bulging discs and pinched nerve    Currently in Pain? Yes    Pain Score 5     Pain Location Back    Pain Orientation Lower    Pain Descriptors / Indicators Shooting;Aching    Pain Type Chronic pain                       [facility] Adult PT Treatment/Exercise - 05/30/21 0001       Lumbar Exercises: Stretches   Figure 4 Stretch 2 reps;30 seconds    Figure 4 Stretch Limitations sitting position    Other Lumbar Stretch Exercise seated trunk rotation, hands hugging chest, 10 x5 sec hold    Other Lumbar Stretch Exercise seated trunk sidebending reaching to floor, 10 x 5 sec      Lumbar Exercises: Seated   Other Seated Lumbar Exercises pelvic tilts A/P on dynadisc, 10 reps each direction; pelvic rotation, 10 reps CW/CCW on dynadisc    Other Seated Lumbar Exercises seated on dynadisc, alternating marching 10 reps, alternating kicks 10 reps      Lumbar Exercises: Supine   Ab Set 10 reps;5 seconds   VC to avoid breath holding   Glut Set 10 reps;3 seconds    Bent Knee Raise 10 reps    Bent Knee Raise Limitations with ab set, avoid breath holding    Bridge 10 reps    Bridge Limitations ~50% ROM, slow/controlled       Lumbar Exercises: Sidelying   Clam Both;10 reps    Clam Limitations Reverse clams, both, 10 reps    Hip Abduction Both;10 reps                  Upper Extremity Functional Index Score :   /80   PT Education - 05/30/21 1426     Education Details Exercise technique, stretching, udpated HEP    Person(s) Educated Patient    Methods Explanation;Demonstration;Handout    Comprehension Verbalized understanding;Returned demonstration              PT Short Term Goals - 05/23/21 1524       PT SHORT TERM GOAL #1  Title Patient will be independent with HEP in order to improve functional outcomes.    Time 3    Period Weeks    Status On-going    Target Date 05/21/21      PT SHORT TERM GOAL #2   Title Patient will report at least 25% improvement in symptoms for improved quality of life.    Time 3    Period Weeks    Status On-going    Target Date 05/21/21      PT SHORT TERM GOAL #3   Title Demonstrate improved centralization of symptoms as evidenced by negative RLE SLR and/or sitting slump    Baseline positive for peripheral symptoms    Time 3    Period Weeks    Status On-going    Target Date 05/21/21               PT Long Term Goals - 05/23/21 1524       PT LONG TERM GOAL #1   Title Demonstrate proper lifting techniuqes to reduce risk for re-injury to low back    Baseline poor lifting mechanics    Time 6    Period Weeks    Status On-going      PT LONG TERM GOAL #2   Title Demo 3+/5 trunk flexion/extension to improve stability    Baseline 2+/5    Time 6    Period Weeks    Status On-going                   Plan - 05/30/21 1349     Clinical Impression Statement Pt tolerates supine isometric ab and glute activation. Added clams and hip abduction for added hip strengthening and followed with figure 4 stretches. Added dynadisc exercises to promote pelvic mobility and encourage core activation. pt's muscles fatigue easily requiring therapeutic  rest breaks. Updated HEP and encouraged pt to perform HEP at anytime during the day. Will continue to progress as able.    Personal Factors and Comorbidities Comorbidity 1;Time since onset of injury/illness/exacerbation    Comorbidities hx of lumbar surgery, Tobacco abuse    Examination-Activity Limitations Bend;Caring for Others;Carry;Lift;Stand;Stairs;Squat;Reach Overhead;Locomotion Level;Transfers    Examination-Participation Restrictions Cleaning;Community Activity;Driving;Yard Work;Occupation;Meal Prep    Stability/Clinical Decision Making Stable/Uncomplicated    Rehab Potential Fair    PT Frequency 1x / week    PT Duration 6 weeks    PT Treatment/Interventions ADLs/Self Care Home Management;Aquatic Therapy;Electrical Stimulation;DME Instruction;Traction;Gait training;Stair training;Functional mobility training;Therapeutic activities;Therapeutic exercise;Balance training;Patient/family education;Neuromuscular re-education;Manual techniques;Taping;Dry needling;Spinal Manipulations;Joint Manipulations    PT Next Visit Plan Continue with core stabilization/strengthening and hip/glute strengthening.    PT Home Exercise Plan prone, prone on elbows, standing extension   8/31: discontinue POE add abdominal bracing and glute sets; 9/7: clam, SL hip abd    Consulted and Agree with Plan of Care Patient             Patient will benefit from skilled therapeutic intervention in order to improve the following deficits and impairments:  Decreased activity tolerance, Decreased range of motion, Decreased strength, Impaired perceived functional ability, Improper body mechanics, Pain  Visit Diagnosis: Muscle weakness (generalized)  Chronic low back pain, unspecified back pain laterality, unspecified whether sciatica present  Other symptoms and signs involving the musculoskeletal system     Problem List Patient Active Problem List   Diagnosis Date Noted   Atypical squamous cells of undetermined  significance on cytologic smear of cervix (ASC-US) 10/31/2020   High grade squamous intraepithelial cervical dysplasia,  S/P cervical conization 10/31/2020   IBS (irritable bowel syndrome) 10/30/2020   Encounter for support and coordination of transition of care 09/14/2020   Cannabis hyperemesis syndrome concurrent with and due to cannabis abuse ([place]) 09/09/2020   Chronic abdominal pain    Chronic radicular lumbar pain 08/23/2020   Chronic pain syndrome 08/23/2020   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) 08/23/2020   Post laminectomy syndrome 08/23/2020   Frequent headaches 08/10/2020   Nexplanon in place 08/10/2020   Irregular intermenstrual bleeding 08/10/2020   Obesity (BMI 30-39.9) 05/20/2020   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   Panic attack 05/18/2020   Abdominal pain, chronic, epigastric 05/18/2020   Agitated 05/18/2020   Gastroesophageal reflux disease 05/10/2020   Alternating constipation and diarrhea 05/10/2020   Bilateral wheezing 02/22/2020   Sleep disturbance 02/22/2020   Anxiety and depression 02/19/2019   Intractable nausea and vomiting 12/07/2018   Low back pain 12/07/2018   Chronic diarrhea 12/07/2018   Condyloma 04/15/2018   History of abnormal cervical Pap smear 11/04/2017   Reflux esophagitis 04/17/2017   Tobacco abuse 04/17/2017   Peptic ulcer disease 04/13/2017   Erosive gastritis 04/11/2017   Anxiety 04/11/2017     Talbot Grumbling PT, DPT 05/30/21, 2:29 PM    [place], [place], [address] Phone: [phone]   Fax:  [phone]  Name: Debbie Santana MRN: [id number] Date of Birth: 05/15/1991

## 2021-06-06 ENCOUNTER — Other Ambulatory Visit: Payer: Self-pay

## 2021-06-06 ENCOUNTER — Ambulatory Visit (HOSPITAL_COMMUNITY): Payer: Medicaid Other

## 2021-06-13 ENCOUNTER — Other Ambulatory Visit: Payer: Self-pay

## 2021-06-13 ENCOUNTER — Ambulatory Visit (HOSPITAL_COMMUNITY): Payer: Medicaid Other | Admitting: Physical Therapy

## 2021-06-13 DIAGNOSIS — M545 Low back pain, unspecified: Secondary | ICD-10-CM | POA: Diagnosis not present

## 2021-06-13 DIAGNOSIS — M6281 Muscle weakness (generalized): Secondary | ICD-10-CM | POA: Diagnosis not present

## 2021-06-13 DIAGNOSIS — G8929 Other chronic pain: Secondary | ICD-10-CM | POA: Diagnosis not present

## 2021-06-13 DIAGNOSIS — R29898 Other symptoms and signs involving the musculoskeletal system: Secondary | ICD-10-CM | POA: Diagnosis not present

## 2021-06-13 NOTE — Therapy (Signed)
[place], [place], [address] Phone: [phone]   Fax:  [phone]  Physical Therapy Treatment  Patient Details  Name: Debbie Santana MRN: [id number] Date of Birth: [birth year] Referring Provider (PT): Carlis Abbott, [facility]  PHYSICAL THERAPY DISCHARGE SUMMARY  Visits from Start of Care: 6  Current functional level related to goals / functional outcomes: See below  goals not met pt only doing exercise about twice a week,   Remaining deficits: See below    Education / Equipment: HEP   Patient agrees to discharge. Patient goals were not met. Patient is being discharged due to did not respond to therapy.   Encounter Date: 06/13/2021   PT End of Session - 06/13/21 1323     Visit Number 4    Number of Visits 4    Date for PT Re-Evaluation 06/11/21    Authorization Type [place] Medicaid Healthy Blue approved 6 visits 8/8-9/19    Authorization - Visit Number 4    Authorization - Number of Visits 6    Progress Note Due on Visit 6    PT Start Time 1320    PT Stop Time 1400    PT Time Calculation (min) 40 min    Activity Tolerance Patient tolerated treatment well    Behavior During Therapy [facility] for tasks assessed/performed             Past Medical History:  Diagnosis Date   Anxiety    Bradycardia 04/17/2017   Chest pain on breathing 02/22/2020   Chronic back pain    Depression    Erosive gastritis 04/11/2017   Gastric ulcer    Genital warts    History of gestational hypertension 05/21/2018   Dx intrapartum   History of kidney stones    Intertrigo 11/16/2019   Intractable abdominal pain 04/11/2017   Intractable vomiting 05/20/2020   Irregular intermenstrual bleeding 02/19/2019   Mental disorder    Migraine without aura and without status migrainosus, not intractable 02/19/2019   Nausea 05/10/2020   Nausea & vomiting 12/07/2018   Nexplanon in place 02/19/2019   Obesity 04/17/2017   Peptic ulcer disease 04/13/2017    PONV (postoperative nausea and vomiting)    Postpartum hypertension 06/23/2018   requiring norvasc, resolved at pp visit   Sciatica    Vaginal Pap smear, abnormal     Past Surgical History:  Procedure Laterality Date   BACK SURGERY     ruptured disc.   BIOPSY  04/14/2017   Procedure: BIOPSY;  Surgeon: Rogene Houston, MD;  [facility];  Service: Endoscopy;;  gastric   BIOPSY  05/22/2020   Procedure: BIOPSY;  Surgeon: Daneil Dolin, MD;  [facility];  Service: Endoscopy;;   BIOPSY  08/29/2020   Procedure: BIOPSY;  Surgeon: Harvel Quale, MD;  [facility];  Service: Gastroenterology;;   BIOPSY  09/09/2020   Procedure: BIOPSY;  Surgeon: Eloise Harman, DO;  [facility];  Service: Endoscopy;;  duodenum;antral   CERVICAL ABLATION N/A 12/02/2018   Procedure: LASER ABLATION OF CERVIX;  Surgeon: Florian Buff, MD;  [facility];  Service: Gynecology;  Laterality: N/A;   CHOLECYSTECTOMY N/A 04/17/2017   Procedure: LAPAROSCOPIC CHOLECYSTECTOMY;  Surgeon: Aviva Signs, MD;  [facility];  Service: General;  Laterality: N/A;   COLONOSCOPY WITH PROPOFOL N/A 08/29/2020   Procedure: COLONOSCOPY WITH PROPOFOL;  Surgeon: Harvel Quale, MD;  [facility];  Service: Gastroenterology;  Laterality: N/A;  11:15   COLPOSCOPY     ESOPHAGOGASTRODUODENOSCOPY (EGD) WITH PROPOFOL N/A 04/14/2017   Procedure: ESOPHAGOGASTRODUODENOSCOPY (EGD) WITH PROPOFOL;  Surgeon: Rogene Houston, MD;  [facility];  Service: Endoscopy;  Laterality: N/A;   ESOPHAGOGASTRODUODENOSCOPY (EGD) WITH PROPOFOL N/A 05/22/2020   Procedure: ESOPHAGOGASTRODUODENOSCOPY (EGD) WITH PROPOFOL;  Surgeon: Daneil Dolin, MD;  [facility];  Service: Endoscopy;  Laterality: N/A;   ESOPHAGOGASTRODUODENOSCOPY (EGD) WITH PROPOFOL N/A 09/09/2020   Procedure: ESOPHAGOGASTRODUODENOSCOPY (EGD) WITH PROPOFOL;  Surgeon: Eloise Harman, DO;   [facility];  Service: Endoscopy;  Laterality: N/A;   ESOPHAGOGASTRODUODENOSCOPY (EGD) WITH PROPOFOL N/A 11/21/2020   Procedure: ESOPHAGOGASTRODUODENOSCOPY (EGD) WITH PROPOFOL;  Surgeon: Harvel Quale, MD;  [facility];  Service: Gastroenterology;  Laterality: N/A;  Am   HERNIA REPAIR Bilateral    inguinal   LASER ABLATION CONDOLAMATA N/A 12/02/2018   Procedure: LASER ABLATION CONDYLOMA ACCUMINATA LEFT AND RIGHT VULVA, PERINEUM AND PERIANAL (15 TOTAL);  Surgeon: Florian Buff, MD;  [facility];  Service: Gynecology;  Laterality: N/A;   POLYPECTOMY  08/29/2020   Procedure: POLYPECTOMY;  Surgeon: Harvel Quale, MD;  [facility];  Service: Gastroenterology;;    There were no vitals filed for this visit.   Subjective Assessment - 06/13/21 1349     Subjective PT states that she is only getting the exercises completed twice a week.  She states that she has plantar fascitis in her feet.  Pt states that she is only coming to therapy because medicaid will not cover the shots before she tries therapy.    Limitations Sitting;Standing;Walking;House hold activities;Lifting    How long can you sit comfortably? "after an hour" no change    How long can you stand comfortably? 3-4 hours was "maybe an hour"    Diagnostic tests recent MRI which reveals bulging discs and pinched nerve    Pain Onset More than a month ago                [facility] PT Assessment - 06/13/21 0001       Assessment   Medical Diagnosis back pain    Referring Provider (PT) Carlis Abbott, [facility]      Prior Function   Level of Independence Independent    Vocation Unemployed    Leisure pt is mother of 24 year old      Observation/Other Assessments   Observations slumped sitting, poor posture      Functional Tests   Functional tests Other      Other:   Other/ Comments poor lifting mechanics demonstrated      AROM   Lumbar Flexion able to touch toes was  decreased by 25%    Lumbar Extension wfl 10% limited   reps no change     Strength   Overall Strength Comments BLE 5/5    Lumbar Flexion 3/5    Lumbar Extension 3/5      Palpation   Palpation comment unrevealing      Special Tests    Special Tests Lumbar    Lumbar Tests Straight Leg Raise;Slump Test                                      PT Short Term Goals - 06/13/21 1336       PT SHORT TERM GOAL #1  Title Patient will be independent with HEP in order to improve functional outcomes.    Time 3    Period Weeks    Status On-going    Target Date 05/21/21      PT SHORT TERM GOAL #2   Title Patient will report at least 25% improvement in symptoms for improved quality of life.    Time 3    Period Weeks    Status On-going    Target Date 05/21/21      PT SHORT TERM GOAL #3   Title Demonstrate improved centralization of symptoms as evidenced by negative RLE SLR and/or sitting slump    Baseline positive for peripheral symptoms    Time 3    Period Weeks    Status On-going    Target Date 05/21/21               PT Long Term Goals - 06/13/21 1336       PT LONG TERM GOAL #1   Title Demonstrate proper lifting techniuqes to reduce risk for re-injury to low back    Baseline poor lifting mechanics    Time 6    Period Weeks    Status On-going      PT LONG TERM GOAL #2   Title Demo 3+/5 trunk flexion/extension to improve stability    Baseline 2+/5    Time 6    Period Weeks    Status On-going                   Plan - 06/13/21 1351     Clinical Impression Statement PT has improved in ROM and trunk strength.  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  Pt states the goal of therapy was to get medicaid to approve shots.  Therapist updated HEP to things she might be able to complete throughout the day.  Pt has not attended therapy on a  regular basis due to transportation and having other obligations. Therapist will discharge and have pt work on [employer]    Personal Factors and Comorbidities Comorbidity 1;Time since onset of injury/illness/exacerbation    Comorbidities hx of lumbar surgery, Tobacco abuse    Examination-Activity Limitations Bend;Caring for Others;Carry;Lift;Stand;Stairs;Squat;Reach Overhead;Locomotion Level;Transfers    Examination-Participation Restrictions Cleaning;Community Activity;Driving;Yard Work;Occupation;Meal Prep    Stability/Clinical Decision Making Stable/Uncomplicated    Rehab Potential Fair    PT Frequency 1x / week    PT Duration 6 weeks    PT Treatment/Interventions ADLs/Self Care Home Management;Aquatic Therapy;Electrical Stimulation;DME Instruction;Traction;Gait training;Stair training;Functional mobility training;Therapeutic activities;Therapeutic exercise;Balance training;Patient/family education;Neuromuscular re-education;Manual techniques;Taping;Dry needling;Spinal Manipulations;Joint Manipulations    PT Next Visit Plan Continue with core stabilization/strengthening and hip/glute strengthening.    PT Home Exercise Plan prone, prone on elbows, standing extension   8/31: discontinue POE add abdominal bracing and glute sets; 9/7: clam, SL hip abd; 9/19:   sitting tall, scapular retraction, sit to stand, squat,    Consulted and Agree with Plan of Care Patient             Patient will benefit from skilled therapeutic intervention in order to improve the following deficits and impairments:  Decreased activity tolerance, Decreased range of motion, Decreased strength, Impaired perceived functional ability, Improper body mechanics, Pain  Visit Diagnosis: Muscle weakness (generalized)  Chronic low back pain, unspecified back pain laterality, unspecified whether sciatica present  Other symptoms and signs involving the musculoskeletal  system     Problem List Patient Active Problem List    Diagnosis Date Noted   Atypical squamous cells of undetermined significance on cytologic smear of cervix (ASC-US) 10/31/2020   High grade squamous intraepithelial cervical dysplasia, S/P cervical conization 10/31/2020   IBS (irritable bowel syndrome) 10/30/2020   Encounter for support and coordination of transition of care 09/14/2020   Cannabis hyperemesis syndrome concurrent with and due to cannabis abuse ([place]) 09/09/2020   Chronic abdominal pain    Chronic radicular lumbar pain 08/23/2020   Chronic pain syndrome 08/23/2020   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) 08/23/2020   Post laminectomy syndrome 08/23/2020   Frequent headaches 08/10/2020   Nexplanon in place 08/10/2020   Irregular intermenstrual bleeding 08/10/2020   Obesity (BMI 30-39.9) 05/20/2020   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   Panic attack 05/18/2020   Abdominal pain, chronic, epigastric 05/18/2020   Agitated 05/18/2020   Gastroesophageal reflux disease 05/10/2020   Alternating constipation and diarrhea 05/10/2020   Bilateral wheezing 02/22/2020   Sleep disturbance 02/22/2020   Anxiety and depression 02/19/2019   Intractable nausea and vomiting 12/07/2018   Low back pain 12/07/2018   Chronic diarrhea 12/07/2018   Condyloma 04/15/2018   History of abnormal cervical Pap smear 11/04/2017   Reflux esophagitis 04/17/2017   Tobacco abuse 04/17/2017   Peptic ulcer disease 04/13/2017   Erosive gastritis 04/11/2017   Anxiety 04/11/2017   Rayetta Humphrey, PT CLT [phone]  06/13/2021, 1:57 PM  [place] [address] [place], [place], [address] Phone: [phone]   Fax:  [phone]  Name: Debbie Santana MRN: [id number] Date of Birth: [birth year]

## 2021-06-20 ENCOUNTER — Encounter (HOSPITAL_COMMUNITY): Payer: Medicaid Other | Admitting: Physical Therapy

## 2021-06-22 ENCOUNTER — Emergency Department (HOSPITAL_COMMUNITY): Payer: Medicaid Other

## 2021-06-22 ENCOUNTER — Other Ambulatory Visit: Payer: Self-pay

## 2021-06-22 ENCOUNTER — Encounter (HOSPITAL_COMMUNITY): Payer: Self-pay

## 2021-06-22 ENCOUNTER — Emergency Department (HOSPITAL_COMMUNITY)
Admission: EM | Admit: 2021-06-22 | Discharge: 2021-06-22 | Disposition: A | Discharge status: Home / Self Care | Payer: Medicaid Other | Attending: Emergency Medicine | Admitting: Emergency Medicine

## 2021-06-22 DIAGNOSIS — R519 Headache, unspecified: Secondary | ICD-10-CM

## 2021-06-22 DIAGNOSIS — M7732 Calcaneal spur, left foot: Secondary | ICD-10-CM | POA: Diagnosis not present

## 2021-06-22 DIAGNOSIS — W458XXA Other foreign body or object entering through skin, initial encounter: Secondary | ICD-10-CM | POA: Diagnosis not present

## 2021-06-22 DIAGNOSIS — M795 Residual foreign body in soft tissue: Secondary | ICD-10-CM | POA: Diagnosis not present

## 2021-06-22 DIAGNOSIS — S90852A Superficial foreign body, left foot, initial encounter: Secondary | ICD-10-CM | POA: Insufficient documentation

## 2021-06-22 DIAGNOSIS — F17210 Nicotine dependence, cigarettes, uncomplicated: Secondary | ICD-10-CM | POA: Diagnosis not present

## 2021-06-22 MED ORDER — CIPROFLOXACIN HCL 500 MG PO TABS: 500.0000 mg | ORAL_TABLET | Freq: Two times a day (BID) | ORAL | 0 refills | Status: AC

## 2021-06-22 MED ORDER — SODIUM CHLORIDE 0.9 % IV BOLUS
1000.0000 mL | Freq: Once | INTRAVENOUS | Status: AC
  Administered 2021-06-22: 1000 mL via INTRAVENOUS

## 2021-06-22 MED ORDER — PROCHLORPERAZINE EDISYLATE 10 MG/2ML IJ SOLN
10.0000 mg | Freq: Once | INTRAMUSCULAR | Status: AC
  Administered 2021-06-22: 10 mg via INTRAVENOUS
  Filled 2021-06-22: qty 2

## 2021-06-22 MED ORDER — KETOROLAC TROMETHAMINE 30 MG/ML IJ SOLN
30.0000 mg | Freq: Once | INTRAMUSCULAR | Status: AC
  Administered 2021-06-22: 30 mg via INTRAMUSCULAR
  Filled 2021-06-22: qty 1

## 2021-06-22 MED ORDER — DIPHENHYDRAMINE HCL 50 MG/ML IJ SOLN
12.5000 mg | Freq: Once | INTRAMUSCULAR | Status: AC
  Administered 2021-06-22: 12.5 mg via INTRAVENOUS
  Filled 2021-06-22: qty 1

## 2021-06-22 MED ORDER — HYDROXYZINE HCL 25 MG PO TABS
50.0000 mg | ORAL_TABLET | Freq: Once | ORAL | Status: AC
  Administered 2021-06-22: 50 mg via ORAL
  Filled 2021-06-22: qty 2

## 2021-06-22 NOTE — ED Triage Notes (Signed)
Pt. States they have left foot pain from where they had something stuck in their heal. Pt. States it calloused over but now there is a blister running along side of it and it is sore. Pt. States they are also feeling stressed today.

## 2021-06-22 NOTE — Discharge Instructions (Addendum)
There is a small foreign body in your heel.  I do not believe it emergent to remove this today however you should follow-up with a foot doctor who will further evaluate you and decide whether or not it is worth trying to remove this object.  I am starting you on some antibiotics to prevent infection from this object.  Please follow-up with your primary care provider about your migraine headache as well as ongoing anxiety.  It was a pleasure to meet you and I hope that you feel better.
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
## 2021-06-25 ENCOUNTER — Telehealth: Payer: Self-pay

## 2021-06-25 NOTE — Telephone Encounter (Signed)
Transition Care Management Unsuccessful Follow-up Telephone Call  Date of discharge and from where:  06/22/2021-[place]   Attempts:  1st Attempt  Reason for unsuccessful TCM follow-up call:  Unable to reach patient

## 2021-06-26 NOTE — Telephone Encounter (Signed)
Transition Care Management Follow-up Telephone Call Date of discharge and from where: 06/22/2021 from [facility] How have you been since you were released from the hospital? Pt stated that she is feeling some better. Patient has started her abx and has not had any side affects thus far.  Any questions or concerns? No  Items Reviewed: Did the pt receive and understand the discharge instructions provided? Yes  Medications obtained and verified? Yes  Other? No  Any new allergies since your discharge? No  Dietary orders reviewed? No Do you have support at home? Yes   Functional Questionnaire: (I = Independent and D = Dependent) ADLs: I  Bathing/Dressing- I  Meal Prep- I  Eating- I  Maintaining continence- I  Transferring/Ambulation- I  Managing Meds- I   Follow up appointments reviewed:  PCP Hospital f/u appt confirmed? No   Specialist Hospital f/u appt confirmed? No   Are transportation arrangements needed? No  If their condition worsens, is the pt aware to call PCP or go to the Emergency Dept.? Yes Was the patient provided with contact information for the PCP's office or ED? Yes Was to pt encouraged to call back with questions or concerns? Yes

## 2021-07-02 ENCOUNTER — Encounter (HOSPITAL_COMMUNITY): Payer: Self-pay | Admitting: Emergency Medicine

## 2021-07-02 ENCOUNTER — Other Ambulatory Visit: Payer: Self-pay

## 2021-07-02 DIAGNOSIS — G43001 Migraine without aura, not intractable, with status migrainosus: Secondary | ICD-10-CM | POA: Diagnosis not present

## 2021-07-02 DIAGNOSIS — N939 Abnormal uterine and vaginal bleeding, unspecified: Secondary | ICD-10-CM | POA: Insufficient documentation

## 2021-07-02 DIAGNOSIS — Z79899 Other long term (current) drug therapy: Secondary | ICD-10-CM | POA: Insufficient documentation

## 2021-07-02 DIAGNOSIS — R519 Headache, unspecified: Secondary | ICD-10-CM | POA: Diagnosis present

## 2021-07-02 DIAGNOSIS — F17210 Nicotine dependence, cigarettes, uncomplicated: Secondary | ICD-10-CM | POA: Insufficient documentation

## 2021-07-02 DIAGNOSIS — G43909 Migraine, unspecified, not intractable, without status migrainosus: Secondary | ICD-10-CM | POA: Insufficient documentation

## 2021-07-02 DIAGNOSIS — K1230 Oral mucositis (ulcerative), unspecified: Secondary | ICD-10-CM | POA: Diagnosis not present

## 2021-07-02 DIAGNOSIS — K121 Other forms of stomatitis: Secondary | ICD-10-CM | POA: Insufficient documentation

## 2021-07-02 NOTE — ED Triage Notes (Signed)
Pt c/o headache for a few days and tooth pain that started tonight after breaking tooth. Pt states she has had vaginal bleeding x one month.

## 2021-07-03 ENCOUNTER — Emergency Department (HOSPITAL_COMMUNITY)
Admission: EM | Admit: 2021-07-03 | Discharge: 2021-07-03 | Disposition: A | Discharge status: Home / Self Care | Payer: Medicaid Other | Attending: Emergency Medicine | Admitting: Emergency Medicine

## 2021-07-03 DIAGNOSIS — K121 Other forms of stomatitis: Secondary | ICD-10-CM

## 2021-07-03 DIAGNOSIS — N939 Abnormal uterine and vaginal bleeding, unspecified: Secondary | ICD-10-CM

## 2021-07-03 DIAGNOSIS — G43009 Migraine without aura, not intractable, without status migrainosus: Secondary | ICD-10-CM

## 2021-07-03 LAB — POC URINE PREG, ED: Preg Test, Ur: NEGATIVE

## 2021-07-03 MED ORDER — PROCHLORPERAZINE EDISYLATE 10 MG/2ML IJ SOLN
10.0000 mg | Freq: Once | INTRAMUSCULAR | Status: AC
  Administered 2021-07-03: 10 mg via INTRAMUSCULAR
  Filled 2021-07-03: qty 2

## 2021-07-03 MED ORDER — AMOXICILLIN 500 MG PO CAPS: 500.0000 mg | ORAL_CAPSULE | Freq: Three times a day (TID) | ORAL | 0 refills | Status: DC

## 2021-07-03 MED ORDER — DIPHENHYDRAMINE HCL 25 MG PO CAPS
50.0000 mg | ORAL_CAPSULE | Freq: Once | ORAL | Status: AC
  Administered 2021-07-03: 50 mg via ORAL
  Filled 2021-07-03: qty 2

## 2021-07-03 MED ORDER — CHLORHEXIDINE GLUCONATE 0.12 % MT SOLN: 15.0000 mL | Freq: Two times a day (BID) | OROMUCOSAL | 0 refills | Status: DC

## 2021-07-03 MED ORDER — KETOROLAC TROMETHAMINE 60 MG/2ML IM SOLN
60.0000 mg | Freq: Once | INTRAMUSCULAR | Status: AC
  Administered 2021-07-03: 60 mg via INTRAMUSCULAR
  Filled 2021-07-03: qty 2

## 2021-07-03 MED ORDER — LIDOCAINE VISCOUS HCL 2 % MT SOLN: 15.0000 mL | OROMUCOSAL | 0 refills | Status: DC | PRN

## 2021-07-03 NOTE — ED Provider Notes (Signed)
[facility] EMERGENCY DEPARTMENT Provider Note   CSN: [id number] Arrival date & time: 07/02/21  2304     History Chief Complaint  Patient presents with   Headache    Debbie Santana is a 30 y.o. female.  Patient presents to the emergency department for evaluation of headache, facial pain, mouth pain.  Patient reports that she has worn a partial since she was 30 years old.  She has had the same one since that time.  Patient reports that it has worn away the gums on the left upper side of her mouth, she has a large wound there.  This is causing a great deal of pain.  She can no longer but the partial in.  Patient now having left-sided facial pain and headache.  Patient reports that the pain is similar to migraines that she has had in the past.  No unusual features.      Past Medical History:  Diagnosis Date   Anxiety    Bradycardia 04/17/2017   Chest pain on breathing 02/22/2020   Chronic back pain    Depression    Erosive gastritis 04/11/2017   Gastric ulcer    Genital warts    History of gestational hypertension 05/21/2018   Dx intrapartum   History of kidney stones    Intertrigo 11/16/2019   Intractable abdominal pain 04/11/2017   Intractable vomiting 05/20/2020   Irregular intermenstrual bleeding 02/19/2019   Mental disorder    Migraine without aura and without status migrainosus, not intractable 02/19/2019   Nausea 05/10/2020   Nausea & vomiting 12/07/2018   Nexplanon in place 02/19/2019   Obesity 04/17/2017   Peptic ulcer disease 04/13/2017   PONV (postoperative nausea and vomiting)    Postpartum hypertension 06/23/2018   requiring norvasc, resolved at pp visit   Sciatica    Vaginal Pap smear, abnormal     Patient Active Problem List   Diagnosis Date Noted   Atypical squamous cells of undetermined significance on cytologic smear of cervix (ASC-US) 10/31/2020   High grade squamous intraepithelial cervical dysplasia, S/P cervical conization 10/31/2020   IBS (irritable bowel  syndrome) 10/30/2020   Encounter for support and coordination of transition of care 09/14/2020   Cannabis hyperemesis syndrome concurrent with and due to cannabis abuse ([place]) 09/09/2020   Chronic abdominal pain    Chronic radicular lumbar pain 08/23/2020   Chronic pain syndrome 08/23/2020   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) 08/23/2020   Post laminectomy syndrome 08/23/2020   Frequent headaches 08/10/2020   Nexplanon in place 08/10/2020   Irregular intermenstrual bleeding 08/10/2020   Obesity (BMI 30-39.9) 05/20/2020   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   Panic attack 05/18/2020   Abdominal pain, chronic, epigastric 05/18/2020   Agitated 05/18/2020   Gastroesophageal reflux disease 05/10/2020   Alternating constipation and diarrhea 05/10/2020   Bilateral wheezing 02/22/2020   Sleep disturbance 02/22/2020   Anxiety and depression 02/19/2019   Intractable nausea and vomiting 12/07/2018   Low back pain 12/07/2018   Chronic diarrhea 12/07/2018   Condyloma 04/15/2018   History of abnormal cervical Pap smear 11/04/2017   Reflux esophagitis 04/17/2017   Tobacco abuse 04/17/2017   Peptic ulcer disease 04/13/2017   Erosive gastritis 04/11/2017   Anxiety 04/11/2017    Past Surgical History:  Procedure Laterality Date   BACK SURGERY     ruptured disc.   BIOPSY  04/14/2017   Procedure: BIOPSY;  Surgeon: Rogene Houston, MD;  [facility];  Service: Endoscopy;;  gastric   BIOPSY  05/22/2020   Procedure: BIOPSY;  Surgeon: Daneil Dolin, MD;  [facility]: [facility];  Service: Endoscopy;;   BIOPSY  08/29/2020   Procedure: BIOPSY;  Surgeon: Harvel Quale, MD;  [facility]: [facility];  Service: Gastroenterology;;   BIOPSY  09/09/2020   Procedure: BIOPSY;  Surgeon: Eloise Harman, DO;  [facility];  Service: Endoscopy;;  duodenum;antral   CERVICAL ABLATION N/A 12/02/2018   Procedure: LASER ABLATION OF CERVIX;  Surgeon: Florian Buff, MD;  Location: [facility];  Service: Gynecology;  Laterality: N/A;   CHOLECYSTECTOMY N/A 04/17/2017   Procedure: LAPAROSCOPIC CHOLECYSTECTOMY;  Surgeon: Aviva Signs, MD;  Location: [facility];  Service: General;  Laterality: N/A;   COLONOSCOPY WITH PROPOFOL N/A 08/29/2020   Procedure: COLONOSCOPY WITH PROPOFOL;  Surgeon: Harvel Quale, MD;  [facility];  Service: Gastroenterology;  Laterality: N/A;  11:15   COLPOSCOPY     ESOPHAGOGASTRODUODENOSCOPY (EGD) WITH PROPOFOL N/A 04/14/2017   Procedure: ESOPHAGOGASTRODUODENOSCOPY (EGD) WITH PROPOFOL;  Surgeon: Rogene Houston, MD;  [facility];  Service: Endoscopy;  Laterality: N/A;   ESOPHAGOGASTRODUODENOSCOPY (EGD) WITH PROPOFOL N/A 05/22/2020   Procedure: ESOPHAGOGASTRODUODENOSCOPY (EGD) WITH PROPOFOL;  Surgeon: Daneil Dolin, MD;  [facility];  Service: Endoscopy;  Laterality: N/A;   ESOPHAGOGASTRODUODENOSCOPY (EGD) WITH PROPOFOL N/A 09/09/2020   Procedure: ESOPHAGOGASTRODUODENOSCOPY (EGD) WITH PROPOFOL;  Surgeon: Eloise Harman, DO;  [facility];  Service: Endoscopy;  Laterality: N/A;   ESOPHAGOGASTRODUODENOSCOPY (EGD) WITH PROPOFOL N/A 11/21/2020   Procedure: ESOPHAGOGASTRODUODENOSCOPY (EGD) WITH PROPOFOL;  Surgeon: Harvel Quale, MD;  [facility];  Service: Gastroenterology;  Laterality: N/A;  Am   HERNIA REPAIR Bilateral    inguinal   LASER ABLATION CONDOLAMATA N/A 12/02/2018   Procedure: LASER ABLATION CONDYLOMA ACCUMINATA LEFT AND RIGHT VULVA, PERINEUM AND PERIANAL (15 TOTAL);  Surgeon: Florian Buff, MD;  Location: [facility];  Service: Gynecology;  Laterality: N/A;   POLYPECTOMY  08/29/2020   Procedure: POLYPECTOMY;  Surgeon: Harvel Quale, MD;  [facility];  Service: Gastroenterology;;     OB History     Gravida  2   Para  1   Term  1   Preterm      AB  1   Living  1      SAB  1   IAB      Ectopic      Multiple  0    Live Births  1           Family History  Problem Relation Age of Onset   Anxiety disorder Mother    Hyperlipidemia Mother    Crohn's disease Sister    Diabetes Maternal Grandfather    Diabetes Cousin    Learning disabilities Cousin     Social History   Tobacco Use   Smoking status: Every Day    Packs/day: 0.50    Years: 5.00    Pack years: 2.50    Types: Cigarettes   Smokeless tobacco: Never  Vaping Use   Vaping Use: Never used  Substance Use Topics   Alcohol use: No   Drug use: No    Home Medications Prior to Admission medications   Medication Sig Start Date End Date Taking? Authorizing Provider  albuterol (VENTOLIN HFA) 108 (90 Base) MCG/ACT inhaler Inhale 2 puffs into the lungs every 6 (six) hours as needed for wheezing or shortness of breath. 09/11/20  Orson Eva, MD  busPIRone (BUSPAR) 15 MG tablet Take 1 tablet (15 mg total) by mouth 3 (three) times daily. 08/10/20   Estill Dooms, NP  butalbital-acetaminophen-caffeine (FIORICET) [id number] MG tablet TAKE 1 TABLET BY MOUTH EVERY 6 HOURS AS NEEDED FOR MIGRAINE HEADACHE 12/04/20   Estill Dooms, NP  etonogestrel (NEXPLANON) 68 MG IMPL implant 68 mg by Subdermal route once.    [provider]  Menthol, Topical Analgesic, (ICY HOT EX) Apply 1 application topically daily as needed (pain).    [provider]  omeprazole (PRILOSEC) 40 MG capsule Take 1 capsule (40 mg total) by mouth 2 (two) times daily. 11/21/20   Harvel Quale, MD  PARoxetine (PAXIL) 40 MG tablet TAKE 1 TABLET BY MOUTH ONCE DAILY IN THE MORNING 03/05/21   Estill Dooms, NP  pregabalin (LYRICA) 50 MG capsule Take 1 capsule (50 mg total) by mouth at bedtime for 10 days, THEN 1 capsule (50 mg total) 2 (two) times daily. Patient taking differently: Take 50 mg by mouth at bedtime. 08/23/20 10/22/20  Gillis Santa, MD  promethazine (PHENERGAN) 25 MG tablet Take 1 tablet (25 mg total) by mouth every 6 (six) hours as  needed for nausea or vomiting. 10/16/20   Estill Dooms, NP  sucralfate (CARAFATE) 1 g tablet Take 1 tablet (1 g total) by mouth 3 (three) times daily before meals. 03/27/21   Harvel Quale, MD  traZODone (DESYREL) 50 MG tablet Take 1 tablet (50 mg total) by mouth at bedtime. 09/19/20   Estill Dooms, NP    Allergies    Nsaids  Review of Systems   Review of Systems  HENT:  Positive for dental problem and mouth sores.   Neurological:  Positive for headaches.  All other systems reviewed and are negative.  Physical Exam Updated Vital Signs BP (!) 138/94   Pulse 70   Temp 98.8 F (37.1 C)   Resp 18   Ht 5\' 9"  (1.753 m)   Wt 92.5 kg   SpO2 100%   BMI 30.13 kg/m   Physical Exam Vitals and nursing note reviewed.  Constitutional:      General: She is not in acute distress.    Appearance: Normal appearance. She is well-developed.  HENT:     Head: Normocephalic and atraumatic.     Right Ear: Hearing normal.     Left Ear: Hearing normal.     Nose: Nose normal.     Mouth/Throat:   Eyes:     Conjunctiva/sclera: Conjunctivae normal.     Pupils: Pupils are equal, round, and reactive to light.  Cardiovascular:     Rate and Rhythm: Regular rhythm.     Heart sounds: S1 normal and S2 normal. No murmur heard.   No friction rub. No gallop.  Pulmonary:     Effort: Pulmonary effort is normal. No respiratory distress.     Breath sounds: Normal breath sounds.  Chest:     Chest wall: No tenderness.  Abdominal:     General: Bowel sounds are normal.     Palpations: Abdomen is soft.     Tenderness: There is no abdominal tenderness. There is no guarding or rebound. Negative signs include Murphy's sign and McBurney's sign.     Hernia: No hernia is present.  Musculoskeletal:        General: Normal range of motion.     Cervical back: Normal range of motion and neck supple.  Skin:    General: Skin  is warm and dry.     Findings: No rash.  Neurological:     Mental  Status: She is alert and oriented to person, place, and time.     GCS: GCS eye subscore is 4. GCS verbal subscore is 5. GCS motor subscore is 6.     Cranial Nerves: No cranial nerve deficit.     Sensory: No sensory deficit.     Coordination: Coordination normal.  Psychiatric:        Speech: Speech normal.        Behavior: Behavior normal.        Thought Content: Thought content normal.    ED Results / Procedures / Treatments   Labs (all labs ordered are listed, but only abnormal results are displayed) Labs Reviewed - No data to display  EKG None  Radiology No results found.  Procedures Procedures   Medications Ordered in ED Medications - No data to display  ED Course  I have reviewed the triage vital signs and the nursing notes.  Pertinent labs & imaging results that were available during my care of the patient were reviewed by me and considered in my medical decision making (see chart for details).    MDM Rules/Calculators/A&P                           Patient presents with mouth pain secondary to an erosion of her hard palate from her partial plate.  I recommended she leave this out and we will treat with topical anesthesia, chlorhexidine.  Patient with migraine headache.  This is a typical migraine for her without unusual features, no red flags.  Treated empirically.  Patient reports vaginal bleeding.  Abdominal exam benign.  She is not pregnant.  No concern for significant anemia.  Will refer to OB/GYN.  Final Clinical Impression(s) / ED Diagnoses Final diagnoses:  Mouth ulcer  Migraine without aura and without status migrainosus, not intractable  Abnormal uterine bleeding    Rx / DC Orders ED Discharge Orders     None        Grisell Bissette, Gwenyth Allegra, MD 07/03/21 0136

## 2021-07-04 ENCOUNTER — Telehealth: Payer: Self-pay

## 2021-07-04 DIAGNOSIS — Z599 Problem related to housing and economic circumstances, unspecified: Secondary | ICD-10-CM

## 2021-07-04 NOTE — Telephone Encounter (Signed)
Transition Care Management Unsuccessful Follow-up Telephone Call  Date of discharge and from where:  07/03/2021 from [facility]  Attempts:  1st Attempt  Reason for unsuccessful TCM follow-up call:  Left voice message

## 2021-07-05 NOTE — Telephone Encounter (Signed)
Transition Care Management Unsuccessful Follow-up Telephone Call  Date of discharge and from where:  07/03/2021 from [facility]  Attempts:  2nd Attempt  Reason for unsuccessful TCM follow-up call:  Voice mail full

## 2021-07-06 NOTE — Telephone Encounter (Signed)
Transition Care Management Follow-up Telephone Call Date of discharge and from where: 07/03/2021 from [facility] How have you been since you were released from the hospital? Pt was able to come to the phone. Pt is having issues with finding Dental Services since patient does not have dental insurance.  Any questions or concerns? No  Items Reviewed: Did the pt receive and understand the discharge instructions provided? Yes  Medications obtained and verified? Yes  Other? No  Any new allergies since your discharge? No  Dietary orders reviewed? No Do you have support at home? Yes   Functional Questionnaire: (I = Independent and D = Dependent) ADLs: I  Bathing/Dressing- I  Meal Prep- I  Eating- I  Maintaining continence- I  Transferring/Ambulation- I  Managing Meds- I   Follow up appointments reviewed:  PCP Hospital f/u appt confirmed? No   Specialist Hospital f/u appt confirmed? No   Are transportation arrangements needed? No  If their condition worsens, is the pt aware to call PCP or go to the Emergency Dept.? Yes Was the patient provided with contact information for the PCP's office or ED? Yes Was to pt encouraged to call back with questions or concerns? Yes

## 2021-07-06 NOTE — Telephone Encounter (Signed)
Transition Care Management Unsuccessful Follow-up Telephone Call  Date of discharge and from where:  07/03/2021 from [facility]  Attempts:  3rd Attempt  Reason for unsuccessful TCM follow-up call:  Unable to reach patient

## 2021-07-06 NOTE — Addendum Note (Signed)
Addended by: Aviva Signs M on: 07/06/2021 10:06 AM   Modules accepted: Orders

## 2021-07-23 ENCOUNTER — Other Ambulatory Visit: Payer: Self-pay | Admitting: Obstetrics and Gynecology

## 2021-07-23 NOTE — Patient Outreach (Signed)
Care Coordination  07/23/2021  MAKALA FETTEROLF April 01, 1991 [id number]  RNCM called patient at scheduled time.  Patient answered phone and asked to be called another day as she was getting ready for an appointment.  RN rescheduled appointment at patient's request to 07/25/21 at 1245-patient in agreement.  Aida Raider RN, BSN [place]  [facility] - Managed Medicaid High Risk [phone].

## 2021-07-24 DIAGNOSIS — F330 Major depressive disorder, recurrent, mild: Secondary | ICD-10-CM | POA: Diagnosis not present

## 2021-07-24 DIAGNOSIS — M79672 Pain in left foot: Secondary | ICD-10-CM | POA: Diagnosis not present

## 2021-07-25 ENCOUNTER — Other Ambulatory Visit: Payer: Self-pay

## 2021-07-25 ENCOUNTER — Other Ambulatory Visit: Payer: Self-pay | Admitting: Obstetrics and Gynecology

## 2021-07-25 DIAGNOSIS — Z599 Problem related to housing and economic circumstances, unspecified: Secondary | ICD-10-CM

## 2021-07-25 NOTE — Patient Outreach (Signed)
Medicaid Managed Care   Nurse Care Manager Note  07/25/2021 Name:  Debbie Santana MRN:  [id number] DOB:  04-15-1991  Debbie Santana is an 30 y.o. year old female who is a primary patient of Leslie Andrea, MD.  The [facility] Managed Care Coordination team was consulted for assistance with:    Chronic healthcare management needs.  Debbie Santana was given information about Medicaid Managed Care Coordination team services today. Debbie Santana Patient agreed to services and verbal consent obtained.  Engaged with patient by telephone for initial visit in response to provider referral for case management and/or care coordination services.   Assessments/Interventions:  Review of past medical history, allergies, medications, health status, including review of consultants reports, laboratory and other test data, was performed as part of comprehensive evaluation and provision of chronic care management services.  SDOH (Social Determinants of Health) assessments and interventions performed: SDOH Interventions    Flowsheet Row Most Recent Value  SDOH Interventions   Food Insecurity Interventions Intervention Not Indicated  Housing Interventions Intervention Not Indicated  Transportation Interventions Intervention Not Indicated       Care Plan  Allergies  Allergen Reactions   Nsaids Other (See Comments)    History of ulcers    Medications Reviewed Today     Reviewed by Gayla Medicus, RN (Registered Nurse) on 07/25/21 at [place] List Status: <None>   Medication Order Taking? Sig Documenting Provider Last Dose Status Informant  albuterol (VENTOLIN HFA) 108 (90 Base) MCG/ACT inhaler [id number] Yes Inhale 2 puffs into the lungs every 6 (six) hours as needed for wheezing or shortness of breath. Orson Eva, MD Taking Active Self  amoxicillin (AMOXIL) 500 MG capsule [id number] No Take 1 capsule (500 mg total) by mouth 3 (three) times daily.  Patient not taking: Reported on 07/25/2021   Orpah Greek, MD Not Taking Active   busPIRone (BUSPAR) 15 MG tablet [id number] No Take 1 tablet (15 mg total) by mouth 3 (three) times daily.  Patient not taking: Reported on 07/25/2021   Estill Dooms, NP Not Taking Active Self  butalbital-acetaminophen-caffeine (FIORICET) 50-325-40 MG tablet [id number] Yes TAKE 1 TABLET BY MOUTH EVERY 6 HOURS AS NEEDED FOR MIGRAINE HEADACHE Estill Dooms, NP Taking Active   chlorhexidine (PERIDEX) 0.12 % solution [id number] No Use as directed 15 mLs in the mouth or throat 2 (two) times daily.  Patient not taking: Reported on 07/25/2021   Orpah Greek, MD Not Taking Active   clonazePAM (KLONOPIN) 1 MG tablet [id number] Yes Take 1 mg by mouth 3 (three) times daily. [provider]  Active Self  escitalopram (LEXAPRO) 20 MG tablet [id number] Yes Take 30 mg by mouth daily. [provider]  Active Self  etonogestrel (NEXPLANON) 68 MG IMPL implant [id number] Yes 68 mg by Subdermal route once. [provider] Taking Active Self           Med Note Nat Christen   Tue Nov 14, 2020  4:15 PM)    lidocaine (XYLOCAINE) 2 % solution [id number] No Use as directed 15 mLs in the mouth or throat as needed for mouth pain.  Patient not taking: Reported on 07/25/2021   Orpah Greek, MD Not Taking Active   Menthol, Topical Analgesic, (ICY HOT EX) [id number] No Apply 1 application topically daily as needed (pain).  Patient not taking: Reported on 07/25/2021   [provider] Not Taking Active Self  omeprazole (PRILOSEC) 40 MG capsule [id number] Yes Take  1 capsule (40 mg total) by mouth 2 (two) times daily. Montez Morita, Quillian Quince, MD Taking Active   PARoxetine (PAXIL) 40 MG tablet [id number] No TAKE 1 TABLET BY MOUTH ONCE DAILY IN THE MORNING  Patient not taking: Reported on 07/25/2021   Estill Dooms, NP Not Taking Active   pregabalin (LYRICA) 50 MG capsule [id number]  Take 1 capsule (50 mg total) by mouth at  bedtime for 10 days, THEN 1 capsule (50 mg total) 2 (two) times daily.  Patient taking differently: Take 50 mg by mouth at bedtime.   Gillis Santa, MD  Expired 10/22/20 2359 Self  promethazine (PHENERGAN) 25 MG tablet [id number] Yes Take 1 tablet (25 mg total) by mouth every 6 (six) hours as needed for nausea or vomiting. Estill Dooms, NP Taking Active Self  sucralfate (CARAFATE) 1 g tablet [id number] Yes Take 1 tablet (1 g total) by mouth 3 (three) times daily before meals. Montez Morita, Quillian Quince, MD Taking Active   traZODone (DESYREL) 50 MG tablet [id number] No Take 1 tablet (50 mg total) by mouth at bedtime.  Patient not taking: Reported on 07/25/2021   Estill Dooms, NP Not Taking Active Self            Patient Active Problem List   Diagnosis Date Noted   Atypical squamous cells of undetermined significance on cytologic smear of cervix (ASC-US) 10/31/2020   High grade squamous intraepithelial cervical dysplasia, S/P cervical conization 10/31/2020   IBS (irritable bowel syndrome) 10/30/2020   Encounter for support and coordination of transition of care 09/14/2020   Cannabis hyperemesis syndrome concurrent with and due to cannabis abuse ([place]) 09/09/2020   Chronic abdominal pain    Chronic radicular lumbar pain 08/23/2020   Chronic pain syndrome 08/23/2020   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) 08/23/2020   Post laminectomy syndrome 08/23/2020   Frequent headaches 08/10/2020   Nexplanon in place 08/10/2020   Irregular intermenstrual bleeding 08/10/2020   Obesity (BMI 30-39.9) 05/20/2020   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   Panic attack 05/18/2020   Abdominal pain, chronic, epigastric 05/18/2020   Agitated 05/18/2020   Gastroesophageal reflux disease 05/10/2020   Alternating constipation and diarrhea 05/10/2020   Bilateral wheezing 02/22/2020   Sleep disturbance 02/22/2020   Anxiety and depression 02/19/2019   Intractable nausea and vomiting  12/07/2018   Low back pain 12/07/2018   Chronic diarrhea 12/07/2018   Condyloma 04/15/2018   History of abnormal cervical Pap smear 11/04/2017   Reflux esophagitis 04/17/2017   Tobacco abuse 04/17/2017   Peptic ulcer disease 04/13/2017   Erosive gastritis 04/11/2017   Anxiety 04/11/2017    Conditions to be addressed/monitored per PCP order:   chronic healthcare management needs, anxiety, depression, tobacco use, GERD, LBP  Care Plan : RN Care Manager Plan of Care  Updates made by Gayla Medicus, RN since 07/25/2021 12:00 AM     Problem: Care Coordination needs   Priority: High  Onset Date: 07/25/2021     Long-Range Goal: Establish Plan of Care and provide Care Coordination Needs   Start Date: 07/25/2021  Expected End Date: 10/25/2021  Priority: High  Note:   Current Barriers:  Knowledge Deficits related to plan of care for management of Anxiety, Depression, Tobacco Use, and back pain, resources for dental and eye provider. Care Coordination needs related to Lacks knowledge of community resource: for eye and dental provider Chronic Disease Management support and education needs related to Anxiety, Depression, Tobacco Use, and back pain. Film/video editor  RNCM Clinical Goal(s):  Patient will verbalize understanding of plan for management of Anxiety, Depression, Tobacco Use, and back pain verbalize basic understanding of  Anxiety, Depression, Tobacco Use, and back pain take all medications exactly as prescribed and will call provider for medication related questions demonstrate understanding of rationale for each prescribed medication  attend all scheduled medical appointments:  continue to work with RN Care Manager to address care management and care coordination needs related to  Anxiety, Depression, Tobacco Use, and back pain work with pharmacist to address medications work with [employer] care guide to address needs related to  [employer] of community resource  doe Facilities manager and eye provider through collaboration with Consulting civil engineer, provider, and care team.   Interventions: Inter-disciplinary care team collaboration (see longitudinal plan of care) Evaluation of current treatment plan related to  self management and patient's adherence to plan as established by provider Care guide referral for dental and eye provider Collaboration with care guide for resources Collaborated with pharmacy Pharmacy referral for medication review.  SDOH Barriers (Status:  New goal. Patient interviewed and SDOH assessment performed        SDOH Interventions    Flowsheet Row Most Recent Value  SDOH Interventions   Food Insecurity Interventions Intervention Not Indicated  Housing Interventions Intervention Not Indicated  Transportation Interventions Intervention Not Indicated     Patient interviewed and appropriate assessments performed Referred patient to community resources care guide team for assistance with eye and dental reosurces Discussed plans with patient for ongoing care management follow up and provided patient with direct contact information for care management team  Patient Goals/Self-Care Activities: Patient will self administer medications as prescribed Patient will attend all scheduled provider appointments Patient will call pharmacy for medication refills Patient will continue to perform ADL's independently Patient will continue to perform IADL's independently Patient will call provider office for new concerns or questions  Follow Up Plan:  The care management team will reach out to the patient again over the next 30 days.    Evidence-based guidance:   Review biopsychosocial determinants of health screens.  Review need for preventive screening based on age, sex, family history and health history.  Determine level of modifiable health risk.  Assess level of child and caregiver activation, level of readiness, importance and confidence to make  changes.  Discuss identified risks.  Identify areas where behavior change may lead to improved health.  Promote healthy lifestyle.  Evoke change talk using open-ended questions, pros and cons, as well as looking forward.  Identify and manage conditions or preconditions to reduce health risk.  Implement additional goals and interventions based on identified risk factors.  Include child's contributions based on age and understanding to self-management plan.  Support child and caregiver active participation in decision-making and self-management plan.      Follow Up:  Patient agrees to Care Plan and Follow-up.  Plan: The Managed Medicaid care management team will reach out to the patient again over the next 30 days. and The  Patient has been provided with contact information for the Managed Medicaid care management team and has been advised to call with any health related questions or concerns.  Date/time of next scheduled RN care management/care coordination outreach: 08/24/21 at 130.

## 2021-07-25 NOTE — Patient Instructions (Signed)
Hi Ms. Birenbaum, thanks for speaking with me today-have a great afternoon.  Ms. Doring was given information about Medicaid Managed Care team care coordination services as a part of their Healthy [facility] Medicaid benefit. [name] verbally consented to engagement with the [facility] Managed Care team.   If you are experiencing a medical emergency, please call 911 or report to your local emergency department or urgent care.   If you have a non-emergency medical problem during routine business hours, please contact your provider's office and ask to speak with a nurse.   For questions related to your Healthy [facility] health plan, please call: [phone] or visit the homepage here: [email or web address]  If you would like to schedule transportation through your Healthy [facility] plan, please call the following number at least 2 days in advance of your appointment: [phone]  Call the [place] at [phone], at any time, 24 hours a day, 7 days a week. If you are in danger or need immediate medical attention call 911.  If you would like help to quit smoking, call 1-800-QUIT-NOW 848-696-2109) OR Espaol: 1-855-Djelo-Ya (2-542-706-2376) o para ms informacin haga clic aqu or Text READY to 200-400 to register via text   The patient verbalized understanding of instructions provided today and declined a print copy of patient instruction materials.   The Managed Medicaid care management team will reach out to the patient again over the next 30 days.  The  Patient   has been provided with contact information for the Managed Medicaid care management team and has been advised to call with any health related questions or concerns.   Aida Raider RN, BSN [place]  [facility]ator - Managed Medicaid High Risk [phone].    Following is a copy of your plan of care:  Care Plan : [place] Point of Care  Updates made by Gayla Medicus, RN since 07/25/2021 12:00 AM     Problem: Care Coordination needs   Priority: High  Onset Date: 07/25/2021     Long-Range Goal: Establish Plan of Care and provide Care Coordination Needs   Start Date: 07/25/2021  Expected End Date: 10/25/2021  Priority: High  Note:   Current Barriers:  Knowledge Deficits related to plan of care for management of Anxiety, Depression, Tobacco Use, and back pain, resources for dental and eye provider. Care Coordination needs related to Lacks knowledge of community resource: for eye and dental provider Chronic Disease Management support and education needs related to Anxiety, Depression, Tobacco Use, and back pain. Financial Constraints  RNCM Clinical Goal(s):  Patient will verbalize understanding of plan for management of Anxiety, Depression, Tobacco Use, and back pain verbalize basic understanding of  Anxiety, Depression, Tobacco Use, and back pain take all medications exactly as prescribed and will call provider for medication related questions demonstrate understanding of rationale for each prescribed medication  attend all scheduled medical appointments:  continue to work with RN Care Manager to address care management and care coordination needs related to  Anxiety, Depression, Tobacco Use, and back pain work with pharmacist to address medications work with [employer] care guide to address needs related to  [employer] of community resource doe Facilities manager and eye provider through collaboration with Consulting civil engineer, provider, and care team.   Interventions: Inter-disciplinary care team collaboration (see longitudinal plan of care) Evaluation of current treatment plan related to  self management and patient's adherence to plan as established by provider Care  guide referral for dental and eye provider Collaboration with care guide for resources Collaborated with pharmacy Pharmacy referral for  medication review.  SDOH Barriers (Status:  New goal. Patient interviewed and SDOH assessment performed        SDOH Interventions    Flowsheet Row Most Recent Value  SDOH Interventions   Food Insecurity Interventions Intervention Not Indicated  Housing Interventions Intervention Not Indicated  Transportation Interventions Intervention Not Indicated     Patient interviewed and appropriate assessments performed Referred patient to community resources care guide team for assistance with eye and dental reosurces Discussed plans with patient for ongoing care management follow up and provided patient with direct contact information for care management team  Patient Goals/Self-Care Activities: Patient will self administer medications as prescribed Patient will attend all scheduled provider appointments Patient will call pharmacy for medication refills Patient will continue to perform ADL's independently Patient will continue to perform IADL's independently Patient will call provider office for new concerns or questions  Follow Up Plan:  The care management team will reach out to the patient again over the next 30 days.    Evidence-based guidance:   Review biopsychosocial determinants of health screens.  Review need for preventive screening based on age, sex, family history and health history.  Determine level of modifiable health risk.  Assess level of child and caregiver activation, level of readiness, importance and confidence to make changes.  Discuss identified risks.  Identify areas where behavior change may lead to improved health.  Promote healthy lifestyle.  Evoke change talk using open-ended questions, pros and cons, as well as looking forward.  Identify and manage conditions or preconditions to reduce health risk.  Implement additional goals and interventions based on identified risk factors.  Include child's contributions based on age and understanding to self-management  plan.  Support child and caregiver active participation in decision-making and self-management plan.

## 2021-07-31 ENCOUNTER — Emergency Department (HOSPITAL_COMMUNITY): Payer: Medicaid Other

## 2021-07-31 ENCOUNTER — Encounter (HOSPITAL_COMMUNITY): Payer: Self-pay

## 2021-07-31 ENCOUNTER — Other Ambulatory Visit: Payer: Self-pay

## 2021-07-31 ENCOUNTER — Emergency Department (HOSPITAL_COMMUNITY)
Admission: EM | Admit: 2021-07-31 | Discharge: 2021-08-01 | Disposition: A | Discharge status: Home / Self Care | Payer: Medicaid Other | Attending: Emergency Medicine | Admitting: Emergency Medicine

## 2021-07-31 DIAGNOSIS — R079 Chest pain, unspecified: Secondary | ICD-10-CM | POA: Diagnosis not present

## 2021-07-31 DIAGNOSIS — R519 Headache, unspecified: Secondary | ICD-10-CM | POA: Diagnosis present

## 2021-07-31 DIAGNOSIS — G43809 Other migraine, not intractable, without status migrainosus: Secondary | ICD-10-CM | POA: Insufficient documentation

## 2021-07-31 DIAGNOSIS — R197 Diarrhea, unspecified: Secondary | ICD-10-CM | POA: Diagnosis not present

## 2021-07-31 DIAGNOSIS — R0789 Other chest pain: Secondary | ICD-10-CM | POA: Diagnosis not present

## 2021-07-31 DIAGNOSIS — F17210 Nicotine dependence, cigarettes, uncomplicated: Secondary | ICD-10-CM | POA: Diagnosis not present

## 2021-07-31 DIAGNOSIS — G43909 Migraine, unspecified, not intractable, without status migrainosus: Secondary | ICD-10-CM | POA: Diagnosis not present

## 2021-07-31 LAB — CBC WITH DIFFERENTIAL/PLATELET
Abs Immature Granulocytes: 0.02 10*3/uL (ref 0.00–0.07)
Basophils Absolute: 0 10*3/uL (ref 0.0–0.1)
Basophils Relative: 0 %
Eosinophils Absolute: 0.2 10*3/uL (ref 0.0–0.5)
Eosinophils Relative: 2 %
HCT: 40.6 % (ref 36.0–46.0)
Hemoglobin: 14.2 g/dL (ref 12.0–15.0)
Immature Granulocytes: 0 %
Lymphocytes Relative: 39 %
Lymphs Abs: 3.7 10*3/uL (ref 0.7–4.0)
MCH: 32.1 pg (ref 26.0–34.0)
MCHC: 35 g/dL (ref 30.0–36.0)
MCV: 91.6 fL (ref 80.0–100.0)
Monocytes Absolute: 0.5 10*3/uL (ref 0.1–1.0)
Monocytes Relative: 6 %
Neutro Abs: 5 10*3/uL (ref 1.7–7.7)
Neutrophils Relative %: 53 %
Platelets: 225 10*3/uL (ref 150–400)
RBC: 4.43 MIL/uL (ref 3.87–5.11)
RDW: 12.3 % (ref 11.5–15.5)
WBC: 9.5 10*3/uL (ref 4.0–10.5)
nRBC: 0 % (ref 0.0–0.2)

## 2021-07-31 MED ORDER — KETOROLAC TROMETHAMINE 30 MG/ML IJ SOLN
15.0000 mg | Freq: Once | INTRAMUSCULAR | Status: AC
  Administered 2021-07-31: 15 mg via INTRAVENOUS
  Filled 2021-07-31: qty 1

## 2021-07-31 MED ORDER — DIPHENHYDRAMINE HCL 50 MG/ML IJ SOLN
25.0000 mg | Freq: Once | INTRAMUSCULAR | Status: AC
  Administered 2021-07-31: 25 mg via INTRAVENOUS
  Filled 2021-07-31: qty 1

## 2021-07-31 MED ORDER — PROCHLORPERAZINE EDISYLATE 10 MG/2ML IJ SOLN
10.0000 mg | Freq: Once | INTRAMUSCULAR | Status: AC
  Administered 2021-07-31: 10 mg via INTRAVENOUS
  Filled 2021-07-31: qty 2

## 2021-07-31 NOTE — ED Provider Notes (Signed)
[facility] EMERGENCY DEPARTMENT Provider Note   CSN: [id number] Arrival date & time: 07/31/21  2216     History Chief Complaint  Patient presents with   Chest Pain    CHASITY OUTTEN is a 30 y.o. female.  Patient reports constant central chest pain for 1 week.  She has not noticed anything that causes it or alleviates it.  It does seem to wax and wane.  When it is at its worst it causes her to have mild trouble breathing.  Patient denies nausea, diaphoresis.  She has not had any cough or fever.  She has had concomitant diarrhea.  Patient reports that she woke up this morning with a migraine.  She took her Fioricet and it did not help.  She reports a global pounding headache similar to previous migraines.      Past Medical History:  Diagnosis Date   Anxiety    Bradycardia 04/17/2017   Chest pain on breathing 02/22/2020   Chronic back pain    Depression    Erosive gastritis 04/11/2017   Gastric ulcer    Genital warts    History of gestational hypertension 05/21/2018   Dx intrapartum   History of kidney stones    Intertrigo 11/16/2019   Intractable abdominal pain 04/11/2017   Intractable vomiting 05/20/2020   Irregular intermenstrual bleeding 02/19/2019   Mental disorder    Migraine without aura and without status migrainosus, not intractable 02/19/2019   Nausea 05/10/2020   Nausea & vomiting 12/07/2018   Nexplanon in place 02/19/2019   Obesity 04/17/2017   Peptic ulcer disease 04/13/2017   Pinched nerve 09/24/2019   PONV (postoperative nausea and vomiting)    Postpartum hypertension 06/23/2018   requiring norvasc, resolved at pp visit   Sciatica    Vaginal Pap smear, abnormal     Patient Active Problem List   Diagnosis Date Noted   Atypical squamous cells of undetermined significance on cytologic smear of cervix (ASC-US) 10/31/2020   High grade squamous intraepithelial cervical dysplasia, S/P cervical conization 10/31/2020   IBS (irritable bowel syndrome)  10/30/2020   Encounter for support and coordination of transition of care 09/14/2020   Cannabis hyperemesis syndrome concurrent with and due to cannabis abuse ([place]) 09/09/2020   Chronic abdominal pain    Chronic radicular lumbar pain 08/23/2020   Chronic pain syndrome 08/23/2020   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) 08/23/2020   Post laminectomy syndrome 08/23/2020   Frequent headaches 08/10/2020   Nexplanon in place 08/10/2020   Irregular intermenstrual bleeding 08/10/2020   Obesity (BMI 30-39.9) 05/20/2020   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   Panic attack 05/18/2020   Abdominal pain, chronic, epigastric 05/18/2020   Agitated 05/18/2020   Gastroesophageal reflux disease 05/10/2020   Alternating constipation and diarrhea 05/10/2020   Bilateral wheezing 02/22/2020   Sleep disturbance 02/22/2020   Anxiety and depression 02/19/2019   Intractable nausea and vomiting 12/07/2018   Low back pain 12/07/2018   Chronic diarrhea 12/07/2018   Condyloma 04/15/2018   History of abnormal cervical Pap smear 11/04/2017   Reflux esophagitis 04/17/2017   Tobacco abuse 04/17/2017   Peptic ulcer disease 04/13/2017   Erosive gastritis 04/11/2017   Anxiety 04/11/2017    Past Surgical History:  Procedure Laterality Date   BACK SURGERY     ruptured disc.   BIOPSY  04/14/2017   Procedure: BIOPSY;  Surgeon: Rogene Houston, MD;  [facility];  Service: Endoscopy;;  gastric   BIOPSY  05/22/2020  Procedure: BIOPSY;  Surgeon: Daneil Dolin, MD;  [facility];  Service: Endoscopy;;   BIOPSY  08/29/2020   Procedure: BIOPSY;  Surgeon: Harvel Quale, MD;  [facility];  Service: Gastroenterology;;   BIOPSY  09/09/2020   Procedure: BIOPSY;  Surgeon: Eloise Harman, DO;  [facility];  Service: Endoscopy;;  duodenum;antral   CERVICAL ABLATION N/A 12/02/2018   Procedure: LASER ABLATION OF CERVIX;  Surgeon: Florian Buff, MD;   Location: [facility];  Service: Gynecology;  Laterality: N/A;   CHOLECYSTECTOMY N/A 04/17/2017   Procedure: LAPAROSCOPIC CHOLECYSTECTOMY;  Surgeon: Aviva Signs, MD;  Location: [facility];  Service: General;  Laterality: N/A;   COLONOSCOPY WITH PROPOFOL N/A 08/29/2020   Procedure: COLONOSCOPY WITH PROPOFOL;  Surgeon: Harvel Quale, MD;  [facility];  Service: Gastroenterology;  Laterality: N/A;  11:15   COLPOSCOPY     ESOPHAGOGASTRODUODENOSCOPY (EGD) WITH PROPOFOL N/A 04/14/2017   Procedure: ESOPHAGOGASTRODUODENOSCOPY (EGD) WITH PROPOFOL;  Surgeon: Rogene Houston, MD;  [facility];  Service: Endoscopy;  Laterality: N/A;   ESOPHAGOGASTRODUODENOSCOPY (EGD) WITH PROPOFOL N/A 05/22/2020   Procedure: ESOPHAGOGASTRODUODENOSCOPY (EGD) WITH PROPOFOL;  Surgeon: Daneil Dolin, MD;  [facility];  Service: Endoscopy;  Laterality: N/A;   ESOPHAGOGASTRODUODENOSCOPY (EGD) WITH PROPOFOL N/A 09/09/2020   Procedure: ESOPHAGOGASTRODUODENOSCOPY (EGD) WITH PROPOFOL;  Surgeon: Eloise Harman, DO;  [facility];  Service: Endoscopy;  Laterality: N/A;   ESOPHAGOGASTRODUODENOSCOPY (EGD) WITH PROPOFOL N/A 11/21/2020   Procedure: ESOPHAGOGASTRODUODENOSCOPY (EGD) WITH PROPOFOL;  Surgeon: Harvel Quale, MD;  [facility];  Service: Gastroenterology;  Laterality: N/A;  Am   HERNIA REPAIR Bilateral    inguinal   LASER ABLATION CONDOLAMATA N/A 12/02/2018   Procedure: LASER ABLATION CONDYLOMA ACCUMINATA LEFT AND RIGHT VULVA, PERINEUM AND PERIANAL (15 TOTAL);  Surgeon: Florian Buff, MD;  Location: [facility];  Service: Gynecology;  Laterality: N/A;   POLYPECTOMY  08/29/2020   Procedure: POLYPECTOMY;  Surgeon: Harvel Quale, MD;  [facility];  Service: Gastroenterology;;     OB History     Gravida  2   Para  1   Term  1   Preterm      AB  1   Living  1      SAB  1   IAB      Ectopic      Multiple  0   Live  Births  1           Family History  Problem Relation Age of Onset   Anxiety disorder Mother    Hyperlipidemia Mother    Crohn's disease Sister    Diabetes Maternal Grandfather    Diabetes Cousin    Learning disabilities Cousin     Social History   Tobacco Use   Smoking status: Every Day    Packs/day: 0.50    Years: 5.00    Pack years: 2.50    Types: Cigarettes   Smokeless tobacco: Never  Vaping Use   Vaping Use: Never used  Substance Use Topics   Alcohol use: No   Drug use: No    Home Medications Prior to Admission medications   Medication Sig Start Date End Date Taking? Authorizing Provider  albuterol (VENTOLIN HFA) 108 (90 Base) MCG/ACT inhaler Inhale 2 puffs into the lungs every 6 (six) hours as needed for wheezing or shortness of breath. 09/11/20   Orson Eva, MD  amoxicillin (AMOXIL)  500 MG capsule Take 1 capsule (500 mg total) by mouth 3 (three) times daily. Patient not taking: Reported on 07/25/2021 07/03/21   Orpah Greek, MD  busPIRone (BUSPAR) 15 MG tablet Take 1 tablet (15 mg total) by mouth 3 (three) times daily. Patient not taking: Reported on 07/25/2021 08/10/20   Estill Dooms, NP  butalbital-acetaminophen-caffeine (FIORICET) [phone] MG tablet TAKE 1 TABLET BY MOUTH EVERY 6 HOURS AS NEEDED FOR MIGRAINE HEADACHE 12/04/20   Estill Dooms, NP  chlorhexidine (PERIDEX) 0.12 % solution Use as directed 15 mLs in the mouth or throat 2 (two) times daily. Patient not taking: Reported on 07/25/2021 07/03/21   Orpah Greek, MD  clonazePAM (KLONOPIN) 1 MG tablet Take 1 mg by mouth 3 (three) times daily.    [provider]  escitalopram (LEXAPRO) 20 MG tablet Take 30 mg by mouth daily.    [provider]  etonogestrel (NEXPLANON) 68 MG IMPL implant 68 mg by Subdermal route once.    [provider]  lidocaine (XYLOCAINE) 2 % solution Use as directed 15 mLs in the mouth or throat as needed for mouth pain. Patient  not taking: Reported on 07/25/2021 07/03/21   Orpah Greek, MD  Menthol, Topical Analgesic, (ICY HOT EX) Apply 1 application topically daily as needed (pain). Patient not taking: Reported on 07/25/2021    [provider]  omeprazole (PRILOSEC) 40 MG capsule Take 1 capsule (40 mg total) by mouth 2 (two) times daily. 11/21/20   Harvel Quale, MD  PARoxetine (PAXIL) 40 MG tablet TAKE 1 TABLET BY MOUTH ONCE DAILY IN THE MORNING Patient not taking: Reported on 07/25/2021 03/05/21   Estill Dooms, NP  pregabalin (LYRICA) 50 MG capsule Take 1 capsule (50 mg total) by mouth at bedtime for 10 days, THEN 1 capsule (50 mg total) 2 (two) times daily. Patient taking differently: Take 50 mg by mouth at bedtime. 08/23/20 10/22/20  Gillis Santa, MD  promethazine (PHENERGAN) 25 MG tablet Take 1 tablet (25 mg total) by mouth every 6 (six) hours as needed for nausea or vomiting. 10/16/20   Estill Dooms, NP  sucralfate (CARAFATE) 1 g tablet Take 1 tablet (1 g total) by mouth 3 (three) times daily before meals. 03/27/21   Harvel Quale, MD  traZODone (DESYREL) 50 MG tablet Take 1 tablet (50 mg total) by mouth at bedtime. Patient not taking: Reported on 07/25/2021 09/19/20   Estill Dooms, NP    Allergies    Nsaids  Review of Systems   Review of Systems  Cardiovascular:  Positive for chest pain.  Gastrointestinal:  Positive for diarrhea.  Neurological:  Positive for headaches.  All other systems reviewed and are negative.  Physical Exam Updated Vital Signs BP 118/76 (BP Location: Right Arm)   Pulse 81   Temp 97.9 F (36.6 C) (Oral)   Resp 17   Ht 5\' 9"  (1.753 m)   Wt 88.9 kg   SpO2 99%   BMI 28.94 kg/m   Physical Exam Vitals and nursing note reviewed.  Constitutional:      General: She is not in acute distress.    Appearance: Normal appearance. She is well-developed.  HENT:     Head: Normocephalic and atraumatic.     Right Ear: Hearing normal.      Left Ear: Hearing normal.     Nose: Nose normal.  Eyes:     Conjunctiva/sclera: Conjunctivae normal.     Pupils: Pupils are equal,  round, and reactive to light.  Cardiovascular:     Rate and Rhythm: Regular rhythm.     Heart sounds: S1 normal and S2 normal. No murmur heard.   No friction rub. No gallop.  Pulmonary:     Effort: Pulmonary effort is normal. No respiratory distress.     Breath sounds: Normal breath sounds.  Chest:     Chest wall: No tenderness.  Abdominal:     General: Bowel sounds are normal.     Palpations: Abdomen is soft.     Tenderness: There is no abdominal tenderness. There is no guarding or rebound. Negative signs include Murphy's sign and McBurney's sign.     Hernia: No hernia is present.  Musculoskeletal:        General: Normal range of motion.     Cervical back: Normal range of motion and neck supple.  Skin:    General: Skin is warm and dry.     Findings: No rash.  Neurological:     Mental Status: She is alert and oriented to person, place, and time.     GCS: GCS eye subscore is 4. GCS verbal subscore is 5. GCS motor subscore is 6.     Cranial Nerves: No cranial nerve deficit.     Sensory: No sensory deficit.     Coordination: Coordination normal.  Psychiatric:        Speech: Speech normal.        Behavior: Behavior normal.        Thought Content: Thought content normal.    ED Results / Procedures / Treatments   Labs (all labs ordered are listed, but only abnormal results are displayed) Labs Reviewed  COMPREHENSIVE METABOLIC PANEL - Abnormal; Notable for the following components:      Result Value   AST 14 (*)    All other components within normal limits  LIPASE, BLOOD - Abnormal; Notable for the following components:   Lipase 52 (*)    All other components within normal limits  CBC WITH DIFFERENTIAL/PLATELET  TROPONIN I (HIGH SENSITIVITY)    EKG EKG Interpretation  Date/Time:  Tuesday July 31 2021 22:32:59 EST Ventricular Rate:   88 PR Interval:  146 QRS Duration: 72 QT Interval:  374 QTC Calculation: 452 R Axis:   64 Text Interpretation: Normal sinus rhythm Normal ECG Confirmed by Orpah Greek [phone]) on 07/31/2021 10:59:07 PM  Radiology DG Chest Port 1 View  Result Date: 07/31/2021 CLINICAL DATA:  Chest pain EXAM: PORTABLE CHEST 1 VIEW COMPARISON:  01/11/2021 FINDINGS: The heart size and mediastinal contours are within normal limits. Both lungs are clear. The visualized skeletal structures are unremarkable. IMPRESSION: No active disease. Electronically Signed   By: Donavan Foil M.D.   On: 07/31/2021 23:50    Procedures Procedures   Medications Ordered in ED Medications  ketorolac (TORADOL) 30 MG/ML injection 15 mg (15 mg Intravenous Given 07/31/21 2335)  prochlorperazine (COMPAZINE) injection 10 mg (10 mg Intravenous Given 07/31/21 2336)  diphenhydrAMINE (BENADRYL) injection 25 mg (25 mg Intravenous Given 07/31/21 2335)    ED Course  I have reviewed the triage vital signs and the nursing notes.  Pertinent labs & imaging results that were available during my care of the patient were reviewed by me and considered in my medical decision making (see chart for details).    MDM Rules/Calculators/A&P  Patient presents to the emergency department for evaluation of chest pain.  Patient has been having continuous substernal chest pain for 1 week.  It is not related to exertion.  Patient has a normal EKG and normal troponin.  Does not require serial troponins with continuous pain for 1 week.  Pain is atypical, it does not appear to be cardiac in nature.  No further work-up necessary at this time.  Patient presents is also experiencing a migraine headache. Patient is currently experiencing a headache that is similar to previous migraines. Patient does not have any unusual features compared to previous migraines. Patient has normal neurologic examination. There are no unusual features,  such as unusual intensity or sudden onset. As this headache is similar to previous migraines, there is no concern for subarachnoid hemorrhage or other etiology. Patient therefore does not require imaging. Patient treated as migraine headache.  Final Clinical Impression(s) / ED Diagnoses Final diagnoses:  Atypical chest pain  Other migraine without status migrainosus, not intractable    Rx / DC Orders ED Discharge Orders     None        Doyce Stonehouse, Gwenyth Allegra, MD 08/01/21 0041

## 2021-07-31 NOTE — ED Triage Notes (Signed)
Pt complains of constant chest pain in epigastric region that has been present for a week. Also complains diarrhea that has been intermittent over the past few days.

## 2021-08-01 LAB — COMPREHENSIVE METABOLIC PANEL
ALT: 12 U/L (ref 0–44)
AST: 14 U/L — ABNORMAL LOW (ref 15–41)
Albumin: 4.4 g/dL (ref 3.5–5.0)
Alkaline Phosphatase: 60 U/L (ref 38–126)
Anion gap: 8 (ref 5–15)
BUN: 10 mg/dL (ref 6–20)
CO2: 27 mmol/L (ref 22–32)
Calcium: 9.8 mg/dL (ref 8.9–10.3)
Chloride: 103 mmol/L (ref 98–111)
Creatinine, Ser: 0.75 mg/dL (ref 0.44–1.00)
GFR, Estimated: >60 mL/min (ref >60)
Glucose, Bld: 78 mg/dL (ref 70–99)
Potassium: 3.5 mmol/L (ref 3.5–5.1)
Sodium: 138 mmol/L (ref 135–145)
Total Bilirubin: 0.4 mg/dL (ref 0.3–1.2)
Total Protein: 7.5 g/dL (ref 6.5–8.1)

## 2021-08-01 LAB — TROPONIN I (HIGH SENSITIVITY): Troponin I (High Sensitivity): <2 ng/L (ref <18)

## 2021-08-01 LAB — LIPASE, BLOOD: Lipase: 52 U/L — ABNORMAL HIGH (ref 11–51)

## 2021-08-01 NOTE — ED Notes (Signed)
Pt given d/c paperwork, verbalized understanding. Ambulatory to lobby with spouse. No further questions.

## 2021-08-02 ENCOUNTER — Telehealth: Payer: Self-pay

## 2021-08-02 NOTE — Telephone Encounter (Signed)
Transition Care Management Follow-up Telephone Call Date of discharge and from where: 08/01/2021-[place]  How have you been since you were released from the hospital? Patient stated she is doing ok and she declined the needs or referrals for additional resources.  Any questions or concerns? No  Items Reviewed: Did the pt receive and understand the discharge instructions provided? Yes  Medications obtained and verified?  No medications sent to the pharmacy. Other? No  Any new allergies since your discharge? No  Dietary orders reviewed? No Do you have support at home? Yes   Home Care and Equipment/Supplies: Were home health services ordered? not applicable If so, what is the name of the agency? N/A  Has the agency set up a time to come to the patient's home? not applicable Were any new equipment or medical supplies ordered?  No What is the name of the medical supply agency? N/A Were you able to get the supplies/equipment? not applicable Do you have any questions related to the use of the equipment or supplies? No  Functional Questionnaire: (I = Independent and D = Dependent) ADLs: I  Bathing/Dressing- I  Meal Prep- I  Eating- I  Maintaining continence- I  Transferring/Ambulation- I  Managing Meds- I  Follow up appointments reviewed:  PCP Hospital f/u appt confirmed? No   Specialist Hospital f/u appt confirmed? No   Are transportation arrangements needed? No  If their condition worsens, is the pt aware to call PCP or go to the Emergency Dept.? Yes Was the patient provided with contact information for the PCP's office or ED? Yes Was to pt encouraged to call back with questions or concerns? Yes

## 2021-08-06 ENCOUNTER — Telehealth: Payer: Self-pay | Admitting: Adult Health

## 2021-08-06 MED ORDER — VALACYCLOVIR HCL 1 G PO TABS: 1000.0000 mg | ORAL_TABLET | Freq: Two times a day (BID) | ORAL | 2 refills | Status: DC

## 2021-08-06 NOTE — Telephone Encounter (Signed)
Patient has a herpes outbreak and wants to know if something could be called in. Patient can"t get in her mychart.

## 2021-08-06 NOTE — Telephone Encounter (Signed)
Will rx valtex. Left message that meds sent

## 2021-08-08 ENCOUNTER — Telehealth: Payer: Self-pay | Admitting: *Deleted

## 2021-08-08 ENCOUNTER — Other Ambulatory Visit: Payer: Self-pay

## 2021-08-08 ENCOUNTER — Ambulatory Visit
Admission: RE | Admit: 2021-08-08 | Discharge: 2021-08-08 | Disposition: A | Discharge status: Home / Self Care | Payer: Medicaid Other | Source: Ambulatory Visit | Attending: Nurse Practitioner | Admitting: Nurse Practitioner

## 2021-08-08 DIAGNOSIS — M545 Low back pain, unspecified: Secondary | ICD-10-CM

## 2021-08-08 DIAGNOSIS — M47817 Spondylosis without myelopathy or radiculopathy, lumbosacral region: Secondary | ICD-10-CM | POA: Diagnosis not present

## 2021-08-08 MED ORDER — METHYLPREDNISOLONE ACETATE 40 MG/ML INJ SUSP (RADIOLOG
80.0000 mg | Freq: Once | INTRAMUSCULAR | Status: AC
  Administered 2021-08-08: 80 mg via EPIDURAL

## 2021-08-08 MED ORDER — IOPAMIDOL (ISOVUE-M 200) INJECTION 41%
1.0000 mL | Freq: Once | INTRAMUSCULAR | Status: AC
  Administered 2021-08-08: 1 mL via EPIDURAL

## 2021-08-08 NOTE — Discharge Instructions (Signed)

## 2021-08-09 NOTE — Telephone Encounter (Signed)
   Telephone encounter was:  Successful.  08/09/2021 Name: Debbie Santana MRN: [id number] DOB: [birth year]  [name] is a 30 y.o. year old female who is a primary care patient of Leslie Andrea, MD . The community resource team was consulted for assistance with emailed Providers to patient upon her request  Care guide performed the following interventions: Patient provided with information about care guide support team and interviewed to confirm resource needs Follow up call placed to community resources to determine status of patients referral.  Follow Up Plan:  No further follow up planned at this time. The patient has been provided with needed resources.  [place], Care Management  [phone] [address] , [place] [phone] Email : [email or web address]

## 2021-08-13 ENCOUNTER — Ambulatory Visit: Payer: Self-pay

## 2021-08-19 ENCOUNTER — Other Ambulatory Visit: Payer: Self-pay

## 2021-08-19 ENCOUNTER — Emergency Department (HOSPITAL_COMMUNITY)
Admission: EM | Admit: 2021-08-19 | Discharge: 2021-08-19 | Disposition: A | Discharge status: Home / Self Care | Payer: Medicaid Other | Attending: Emergency Medicine | Admitting: Emergency Medicine

## 2021-08-19 DIAGNOSIS — K0889 Other specified disorders of teeth and supporting structures: Secondary | ICD-10-CM | POA: Diagnosis present

## 2021-08-19 DIAGNOSIS — K047 Periapical abscess without sinus: Secondary | ICD-10-CM | POA: Diagnosis not present

## 2021-08-19 DIAGNOSIS — F17210 Nicotine dependence, cigarettes, uncomplicated: Secondary | ICD-10-CM | POA: Diagnosis not present

## 2021-08-19 MED ORDER — HYDROCODONE-ACETAMINOPHEN 5-325 MG PO TABS
1.0000 | ORAL_TABLET | Freq: Once | ORAL | Status: AC
  Administered 2021-08-19: 18:00:00 1 via ORAL
  Filled 2021-08-19: qty 1

## 2021-08-19 MED ORDER — AMOXICILLIN 500 MG PO CAPS: 500.0000 mg | ORAL_CAPSULE | Freq: Three times a day (TID) | ORAL | 0 refills | Status: DC

## 2021-08-19 MED ORDER — AMOXICILLIN 250 MG PO CAPS
500.0000 mg | ORAL_CAPSULE | Freq: Once | ORAL | Status: AC
  Administered 2021-08-19: 18:00:00 500 mg via ORAL
  Filled 2021-08-19: qty 2

## 2021-08-19 MED ORDER — HYDROCODONE-ACETAMINOPHEN 5-325 MG PO TABS: 1.0000 | ORAL_TABLET | Freq: Four times a day (QID) | ORAL | 0 refills | Status: DC | PRN

## 2021-08-19 MED ORDER — TRAMADOL HCL 50 MG PO TABS
50.0000 mg | ORAL_TABLET | Freq: Once | ORAL | Status: DC
Start: 2021-08-19 — End: 2021-08-19

## 2021-08-19 NOTE — Discharge Instructions (Signed)
Complete your entire course of antibiotics as prescribed.  You  may use the hydrocodone for pain relief but do not drive within 4 hours of taking as this will make you drowsy.  Avoid applying heat or ice to this abscess area which can worsen your symptoms.  You may use warm salt water swish and spit treatment or half peroxide and water swish and spit after meals to keep this area clean. Call the dentist listed above for further management of your symptoms.

## 2021-08-19 NOTE — ED Provider Notes (Signed)
[facility] EMERGENCY DEPARTMENT Provider Note   CSN: [id number] Arrival date & time: 08/19/21  1648     History Chief Complaint  Patient presents with   Dental Pain    Debbie Santana is a 30 y.o. female presenting with left upper 3rd molar dental pain.  She has a upper central partial which has worn since the age of 65 which has worn away and caused decay in this molar.  She has had discomfort for a few days but today the pain is severe, constant, throbbing and cold sensitive.  She denies fevers, chills, difficulty breathing or swallowing, but chewing painful.  She has tried topical orajel, tylenol without relief.   The history is provided by the patient.      Past Medical History:  Diagnosis Date   Anxiety    Bradycardia 04/17/2017   Chest pain on breathing 02/22/2020   Chronic back pain    Depression    Erosive gastritis 04/11/2017   Gastric ulcer    Genital warts    History of gestational hypertension 05/21/2018   Dx intrapartum   History of kidney stones    Intertrigo 11/16/2019   Intractable abdominal pain 04/11/2017   Intractable vomiting 05/20/2020   Irregular intermenstrual bleeding 02/19/2019   Mental disorder    Migraine without aura and without status migrainosus, not intractable 02/19/2019   Nausea 05/10/2020   Nausea & vomiting 12/07/2018   Nexplanon in place 02/19/2019   Obesity 04/17/2017   Peptic ulcer disease 04/13/2017   Pinched nerve 09/24/2019   PONV (postoperative nausea and vomiting)    Postpartum hypertension 06/23/2018   requiring norvasc, resolved at pp visit   Sciatica    Vaginal Pap smear, abnormal     Patient Active Problem List   Diagnosis Date Noted   Atypical squamous cells of undetermined significance on cytologic smear of cervix (ASC-US) 10/31/2020   High grade squamous intraepithelial cervical dysplasia, S/P cervical conization 10/31/2020   IBS (irritable bowel syndrome) 10/30/2020   Encounter for support and coordination of  transition of care 09/14/2020   Cannabis hyperemesis syndrome concurrent with and due to cannabis abuse ([place]) 09/09/2020   Chronic abdominal pain    Chronic radicular lumbar pain 08/23/2020   Chronic pain syndrome 08/23/2020   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) 08/23/2020   Post laminectomy syndrome 08/23/2020   Frequent headaches 08/10/2020   Nexplanon in place 08/10/2020   Irregular intermenstrual bleeding 08/10/2020   Obesity (BMI 30-39.9) 05/20/2020   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   Panic attack 05/18/2020   Abdominal pain, chronic, epigastric 05/18/2020   Agitated 05/18/2020   Gastroesophageal reflux disease 05/10/2020   Alternating constipation and diarrhea 05/10/2020   Bilateral wheezing 02/22/2020   Sleep disturbance 02/22/2020   Anxiety and depression 02/19/2019   Intractable nausea and vomiting 12/07/2018   Low back pain 12/07/2018   Chronic diarrhea 12/07/2018   Condyloma 04/15/2018   History of abnormal cervical Pap smear 11/04/2017   Reflux esophagitis 04/17/2017   Tobacco abuse 04/17/2017   Peptic ulcer disease 04/13/2017   Erosive gastritis 04/11/2017   Anxiety 04/11/2017    Past Surgical History:  Procedure Laterality Date   BACK SURGERY     ruptured disc.   BIOPSY  04/14/2017   Procedure: BIOPSY;  Surgeon: Rogene Houston, MD;  [facility];  Service: Endoscopy;;  gastric   BIOPSY  05/22/2020   Procedure: BIOPSY;  Surgeon: Daneil Dolin, MD;  [facility];  Service: Endoscopy;;   BIOPSY  08/29/2020   Procedure: BIOPSY;  Surgeon: Harvel Quale, MD;  [facility];  Service: Gastroenterology;;   BIOPSY  09/09/2020   Procedure: BIOPSY;  Surgeon: Eloise Harman, DO;  [facility];  Service: Endoscopy;;  duodenum;antral   CERVICAL ABLATION N/A 12/02/2018   Procedure: LASER ABLATION OF CERVIX;  Surgeon: Florian Buff, MD;  Location: [facility];  Service: Gynecology;  Laterality: N/A;    CHOLECYSTECTOMY N/A 04/17/2017   Procedure: LAPAROSCOPIC CHOLECYSTECTOMY;  Surgeon: Aviva Signs, MD;  Location: [facility];  Service: General;  Laterality: N/A;   COLONOSCOPY WITH PROPOFOL N/A 08/29/2020   Procedure: COLONOSCOPY WITH PROPOFOL;  Surgeon: Harvel Quale, MD;  [facility];  Service: Gastroenterology;  Laterality: N/A;  11:15   COLPOSCOPY     ESOPHAGOGASTRODUODENOSCOPY (EGD) WITH PROPOFOL N/A 04/14/2017   Procedure: ESOPHAGOGASTRODUODENOSCOPY (EGD) WITH PROPOFOL;  Surgeon: Rogene Houston, MD;  [facility];  Service: Endoscopy;  Laterality: N/A;   ESOPHAGOGASTRODUODENOSCOPY (EGD) WITH PROPOFOL N/A 05/22/2020   Procedure: ESOPHAGOGASTRODUODENOSCOPY (EGD) WITH PROPOFOL;  Surgeon: Daneil Dolin, MD;  [facility];  Service: Endoscopy;  Laterality: N/A;   ESOPHAGOGASTRODUODENOSCOPY (EGD) WITH PROPOFOL N/A 09/09/2020   Procedure: ESOPHAGOGASTRODUODENOSCOPY (EGD) WITH PROPOFOL;  Surgeon: Eloise Harman, DO;  [facility];  Service: Endoscopy;  Laterality: N/A;   ESOPHAGOGASTRODUODENOSCOPY (EGD) WITH PROPOFOL N/A 11/21/2020   Procedure: ESOPHAGOGASTRODUODENOSCOPY (EGD) WITH PROPOFOL;  Surgeon: Harvel Quale, MD;  [facility];  Service: Gastroenterology;  Laterality: N/A;  Am   HERNIA REPAIR Bilateral    inguinal   LASER ABLATION CONDOLAMATA N/A 12/02/2018   Procedure: LASER ABLATION CONDYLOMA ACCUMINATA LEFT AND RIGHT VULVA, PERINEUM AND PERIANAL (15 TOTAL);  Surgeon: Florian Buff, MD;  Location: [facility];  Service: Gynecology;  Laterality: N/A;   POLYPECTOMY  08/29/2020   Procedure: POLYPECTOMY;  Surgeon: Harvel Quale, MD;  [facility];  Service: Gastroenterology;;     OB History     Gravida  2   Para  1   Term  1   Preterm      AB  1   Living  1      SAB  1   IAB      Ectopic      Multiple  0   Live Births  1           Family History  Problem Relation  Age of Onset   Anxiety disorder Mother    Hyperlipidemia Mother    Crohn's disease Sister    Diabetes Maternal Grandfather    Diabetes Cousin    Learning disabilities Cousin     Social History   Tobacco Use   Smoking status: Every Day    Packs/day: 0.50    Years: 5.00    Pack years: 2.50    Types: Cigarettes   Smokeless tobacco: Never  Vaping Use   Vaping Use: Never used  Substance Use Topics   Alcohol use: No   Drug use: No    Home Medications Prior to Admission medications   Medication Sig Start Date End Date Taking? Authorizing Provider  amoxicillin (AMOXIL) 500 MG capsule Take 1 capsule (500 mg total) by mouth 3 (three) times daily. 08/19/21  Yes Mayco Walrond, Almyra Free, PA-C  HYDROcodone-acetaminophen (NORCO/VICODIN) 5-325 MG tablet Take 1 tablet by mouth every 6 (six) hours as needed. 08/19/21  Yes Alnita Aybar, Almyra Free, PA-C  albuterol (VENTOLIN  HFA) 108 (90 Base) MCG/ACT inhaler Inhale 2 puffs into the lungs every 6 (six) hours as needed for wheezing or shortness of breath. 09/11/20   Orson Eva, MD  busPIRone (BUSPAR) 15 MG tablet Take 1 tablet (15 mg total) by mouth 3 (three) times daily. Patient not taking: Reported on 07/25/2021 08/10/20   Estill Dooms, NP  butalbital-acetaminophen-caffeine (FIORICET) [phone] MG tablet TAKE 1 TABLET BY MOUTH EVERY 6 HOURS AS NEEDED FOR MIGRAINE HEADACHE 12/04/20   Estill Dooms, NP  chlorhexidine (PERIDEX) 0.12 % solution Use as directed 15 mLs in the mouth or throat 2 (two) times daily. Patient not taking: Reported on 07/25/2021 07/03/21   Orpah Greek, MD  clonazePAM (KLONOPIN) 1 MG tablet Take 1 mg by mouth 3 (three) times daily.    [provider]  escitalopram (LEXAPRO) 20 MG tablet Take 30 mg by mouth daily.    [provider]  etonogestrel (NEXPLANON) 68 MG IMPL implant 68 mg by Subdermal route once.    [provider]  lidocaine (XYLOCAINE) 2 % solution Use as directed 15 mLs in the mouth or throat  as needed for mouth pain. Patient not taking: Reported on 07/25/2021 07/03/21   Orpah Greek, MD  Menthol, Topical Analgesic, (ICY HOT EX) Apply 1 application topically daily as needed (pain). Patient not taking: Reported on 07/25/2021    [provider]  omeprazole (PRILOSEC) 40 MG capsule Take 1 capsule (40 mg total) by mouth 2 (two) times daily. 11/21/20   Harvel Quale, MD  PARoxetine (PAXIL) 40 MG tablet TAKE 1 TABLET BY MOUTH ONCE DAILY IN THE MORNING Patient not taking: Reported on 07/25/2021 03/05/21   Estill Dooms, NP  pregabalin (LYRICA) 50 MG capsule Take 1 capsule (50 mg total) by mouth at bedtime for 10 days, THEN 1 capsule (50 mg total) 2 (two) times daily. Patient taking differently: Take 50 mg by mouth at bedtime. 08/23/20 10/22/20  Gillis Santa, MD  promethazine (PHENERGAN) 25 MG tablet Take 1 tablet (25 mg total) by mouth every 6 (six) hours as needed for nausea or vomiting. 10/16/20   Estill Dooms, NP  sucralfate (CARAFATE) 1 g tablet Take 1 tablet (1 g total) by mouth 3 (three) times daily before meals. 03/27/21   Harvel Quale, MD  traZODone (DESYREL) 50 MG tablet Take 1 tablet (50 mg total) by mouth at bedtime. Patient not taking: Reported on 07/25/2021 09/19/20   Estill Dooms, NP  valACYclovir (VALTREX) 1000 MG tablet Take 1 tablet (1,000 mg total) by mouth 2 (two) times daily. 08/06/21   Estill Dooms, NP    Allergies    Nsaids  Review of Systems   Review of Systems  Constitutional:  Negative for fever.  HENT:  Positive for dental problem. Negative for facial swelling and sore throat.   Respiratory:  Negative for shortness of breath.   Musculoskeletal:  Negative for neck pain and neck stiffness.  All other systems reviewed and are negative.  Physical Exam Updated Vital Signs BP (!) 150/94 (BP Location: Right Arm)   Pulse 69   Temp 98.5 F (36.9 C) (Oral)   Resp 14   Ht 5\' 9"  (1.753 m)   Wt 87.1 kg    SpO2 99%   BMI 28.35 kg/m   Physical Exam Constitutional:      General: She is not in acute distress.    Appearance: She is well-developed.  HENT:     Head: Normocephalic and  atraumatic.     Jaw: No trismus.     Mouth/Throat:     Mouth: Mucous membranes are moist. No oral lesions.     Dentition: Dental abscesses present.     Pharynx: Oropharynx is clear. No posterior oropharyngeal erythema.     Comments: Upper central edentulous, pain left upper 3rd,  decay appreciated, mild gingival edema lateral to this tooth.  Sublingual space soft.   Eyes:     Conjunctiva/sclera: Conjunctivae normal.  Cardiovascular:     Rate and Rhythm: Normal rate.     Heart sounds: Normal heart sounds. No murmur heard. Pulmonary:     Effort: Pulmonary effort is normal.  Musculoskeletal:        General: Normal range of motion.     Cervical back: Normal range of motion and neck supple.  Lymphadenopathy:     Cervical: No cervical adenopathy.  Skin:    General: Skin is warm and dry.     Findings: No erythema.  Neurological:     Mental Status: She is alert and oriented to person, place, and time.    ED Results / Procedures / Treatments   Labs (all labs ordered are listed, but only abnormal results are displayed) Labs Reviewed - No data to display  EKG None  Radiology No results found.  Procedures Procedures   Medications Ordered in ED Medications  amoxicillin (AMOXIL) capsule 500 mg (500 mg Oral Given 08/19/21 1806)  HYDROcodone-acetaminophen (NORCO/VICODIN) 5-325 MG per tablet 1 tablet (1 tablet Oral Given 08/19/21 1806)    ED Course  I have reviewed the triage vital signs and the nursing notes.  Pertinent labs & imaging results that were available during my care of the patient were reviewed by me and considered in my medical decision making (see chart for details).    MDM Rules/Calculators/A&P                           Pt with dental abscess, cold sensitivity with suspected deep  cavity with nerve root involvement 3rd upper molar, no trismus.  Started on amoxil, hydrocodone given - 24 hour supply after review of [place] narc DB.  Dental referrals given.  Final Clinical Impression(s) / ED Diagnoses Final diagnoses:  Dental infection    Rx / DC Orders ED Discharge Orders          Ordered    amoxicillin (AMOXIL) 500 MG capsule  3 times daily        08/19/21 1759    HYDROcodone-acetaminophen (NORCO/VICODIN) 5-325 MG tablet  Every 6 hours PRN        08/19/21 1759             Evalee Jefferson, PA-C 08/19/21 1915    Isla Pence, MD 08/19/21 1926

## 2021-08-19 NOTE — ED Triage Notes (Signed)
Left upper dental pain. Does not have a Pharmacist, community.

## 2021-08-19 NOTE — ED Notes (Signed)
ED Provider at bedside. 

## 2021-08-20 ENCOUNTER — Telehealth: Payer: Self-pay | Admitting: Pharmacist

## 2021-08-20 NOTE — Patient Outreach (Signed)
08/13/2021 Name: Debbie Santana MRN: [id number] DOB: 07-09-1991  Referred by: Leslie Andrea, MD Reason for referral : No chief complaint on file.   An unsuccessful telephone outreach was attempted today. The patient was referred to the case management team for assistance with care management and care coordination.    Follow Up Plan: The Managed Medicaid care management team will reach out to the patient again over the next 10 days.    Hughes Better PharmD, CPP High Risk Managed Medicaid [place] [phone]

## 2021-08-21 DIAGNOSIS — K0889 Other specified disorders of teeth and supporting structures: Secondary | ICD-10-CM | POA: Diagnosis not present

## 2021-08-21 DIAGNOSIS — F410 Panic disorder [episodic paroxysmal anxiety] without agoraphobia: Secondary | ICD-10-CM | POA: Diagnosis not present

## 2021-08-24 ENCOUNTER — Other Ambulatory Visit: Payer: Self-pay

## 2021-08-24 ENCOUNTER — Other Ambulatory Visit: Payer: Self-pay | Admitting: Obstetrics and Gynecology

## 2021-08-24 NOTE — Patient Instructions (Signed)
Hi Ms. Mcclurkin, thanks for speaking with me, I hope you feel better.  Ms. Oppedisano was given information about Medicaid Managed Care team care coordination services as a part of their Healthy [facility] Medicaid benefit. [name] verbally consented to engagement with the [facility] Managed Care team.   If you are experiencing a medical emergency, please call 911 or report to your local emergency department or urgent care.   If you have a non-emergency medical problem during routine business hours, please contact your provider's office and ask to speak with a nurse.   For questions related to your Healthy [facility] health plan, please call: [phone] or visit the homepage here: [email or web address]  If you would like to schedule transportation through your Healthy [facility] plan, please call the following number at least 2 days in advance of your appointment: [phone]  Call the [place] at [phone], at any time, 24 hours a day, 7 days a week. If you are in danger or need immediate medical attention call 911.  If you would like help to quit smoking, call 1-800-QUIT-NOW 708-429-1335) OR Espaol: 1-855-Djelo-Ya (0-240-973-5329) o para ms informacin haga clic aqu or Text READY to 200-400 to register via text  Ms. Vigil - following are the goals we discussed in your visit today:   Goals Addressed      Patient will self administer medications as prescribed Patient will attend all scheduled provider appointments Patient will call pharmacy for medication refills Patient will continue to perform ADL's independently Patient will continue to perform IADL's independently Patient will call provider office for new concerns or questions   The patient verbalized understanding of instructions provided today and declined a print copy of patient instruction materials.   The Managed Medicaid care management team will reach out to  the patient again over the next 30 days.  The  Patient has been provided with contact information for the Managed Medicaid care management team and has been advised to call with any health related questions or concerns.   Aida Raider RN, BSN [place] Management Coordinator - Managed Medicaid High Risk [phone]   Following is a copy of your plan of care:  Care Plan : [place] of Care  Updates made by Gayla Medicus, RN since 08/24/2021 12:00 AM     Problem: Care Coordination needs   Priority: High  Onset Date: 07/25/2021     Long-Range Goal: Establish Plan of Care and provide Care Coordination Needs   Start Date: 07/25/2021  Expected End Date: 10/25/2021  Priority: High  Note:   Current Barriers:  Knowledge Deficits related to plan of care for management of Anxiety, Depression, Tobacco Use, and back pain, resources for dental and eye provider. Care Coordination needs related to Lacks knowledge of community resource: for eye and dental provider Chronic Disease Management support and education needs related to Anxiety, Depression, Tobacco Use, and back pain. Financial Constraints Update 08/24/21:  patient emailed dental and eye providers by Care Guide and patient cannot locate.  Needs to see dental provider due to tooth pain, 7 on a scale of 1-10, taking pain medications as needed.  RNCM Clinical Goal(s):  Patient will verbalize understanding of plan for management of Anxiety, Depression, Tobacco Use, and back pain verbalize basic understanding of  Anxiety, Depression, Tobacco Use, and back pain take all medications exactly as prescribed and will call provider for medication related questions demonstrate understanding of rationale for each prescribed  medication  attend all scheduled medical appointments:  continue to work with RN Care Manager to address care management and care coordination needs related to  Anxiety, Depression, Tobacco  Use, and back pain work with pharmacist to address medications-appointment scheduled for 09/11/21 work with community resource care guide to address needs related to  [employer] of community resource for  dental and eye provider through collaboration with Consulting civil engineer, provider, and care team.  Patient will schedule an appointment with dental provider.  Interventions: Inter-disciplinary care team collaboration (see longitudinal plan of care) Evaluation of current treatment plan related to  self management and patient's adherence to plan as established by provider Care guide referral for dental and eye provider-completed, but patient did not receive. 08/24/21-will ask Care Guide to call patient with information at her request. Collaboration with care guide for resources Collaborated with pharmacy Pharmacy referral for medication review. Patient given 1-800-QUIT NOW number for resources.  SDOH Barriers (Status:  New goal. Patient interviewed and SDOH assessment performed        SDOH Interventions    Flowsheet Row Most Recent Value  SDOH Interventions   Food Insecurity Interventions Intervention Not Indicated  Housing Interventions Intervention Not Indicated  Transportation Interventions Intervention Not Indicated     Patient interviewed and appropriate assessments performed Referred patient to community resources care guide team for assistance with eye and dental reosurces Discussed plans with patient for ongoing care management follow up and provided patient with direct contact information for care management team  Patient Goals/Self-Care Activities: Patient will self administer medications as prescribed Patient will attend all scheduled provider appointments Patient will call pharmacy for medication refills Patient will continue to perform ADL's independently Patient will continue to perform IADL's independently Patient will call provider office for new concerns or  questions Patient will schedule an appointment with dental provider.  Follow Up Plan:  The care management team will reach out to the patient again over the next 30 days.    Evidence-based guidance:   Review biopsychosocial determinants of health screens.  Review need for preventive screening based on age, sex, family history and health history.  Determine level of modifiable health risk.  Assess level of child and caregiver activation, level of readiness, importance and confidence to make changes.  Discuss identified risks.  Identify areas where behavior change may lead to improved health.  Promote healthy lifestyle.  Evoke change talk using open-ended questions, pros and cons, as well as looking forward.  Identify and manage conditions or preconditions to reduce health risk.  Implement additional goals and interventions based on identified risk factors.  Include child's contributions based on age and understanding to self-management plan.  Support child and caregiver active participation in decision-making and self-management plan.

## 2021-08-24 NOTE — Patient Outreach (Signed)
Medicaid Managed Care   Nurse Care Manager Note  08/24/2021 Name:  Debbie Santana MRN:  [id number] DOB:  02-Dec-1990  Debbie Santana is an 30 y.o. year old female who is a primary patient of Debbie Andrea, MD.  The [facility] Managed Care Coordination team was consulted for assistance with:    Chronic healthcare management needs.  Ms. Debbie Santana was given information about Medicaid Managed Care Coordination team services today. Debbie Santana Patient agreed to services and verbal consent obtained.  Engaged with patient by telephone for follow up visit in response to provider referral for case management and/or care coordination services.   Assessments/Interventions:  Review of past medical history, allergies, medications, health status, including review of consultants reports, laboratory and other test data, was performed as part of comprehensive evaluation and provision of chronic care management services.  SDOH (Social Determinants of Health) assessments and interventions performed: SDOH Interventions    Flowsheet Row Most Recent Value  SDOH Interventions   Financial Strain Interventions Intervention Not Indicated       Care Plan  Allergies  Allergen Reactions   Nsaids Other (See Comments)    History of ulcers    Medications Reviewed Today     Reviewed by Gayla Medicus, RN (Registered Nurse) on 08/24/21 at 1323  Med List Status: <None>   Medication Order Taking? Sig Documenting Provider Last Dose Status Informant  albuterol (VENTOLIN HFA) 108 (90 Base) MCG/ACT inhaler [id number]  Inhale 2 puffs into the lungs every 6 (six) hours as needed for wheezing or shortness of breath. Orson Eva, MD  Active Self  amoxicillin (AMOXIL) 500 MG capsule [id number]  Take 1 capsule (500 mg total) by mouth 3 (three) times daily. Evalee Jefferson, PA-C  Active   busPIRone (BUSPAR) 15 MG tablet [id number]  Take 1 tablet (15 mg total) by mouth 3 (three) times daily.  Patient not taking: Reported on  07/25/2021   Estill Dooms, NP  Active Self  butalbital-acetaminophen-caffeine (FIORICET) 50-325-40 MG tablet [id number]  TAKE 1 TABLET BY MOUTH EVERY 6 HOURS AS NEEDED FOR MIGRAINE HEADACHE Derrek Monaco A, NP  Active   chlorhexidine (PERIDEX) 0.12 % solution [id number]  Use as directed 15 mLs in the mouth or throat 2 (two) times daily.  Patient not taking: Reported on 07/25/2021   Orpah Greek, MD  Active   clonazePAM (KLONOPIN) 1 MG tablet [id number]  Take 1 mg by mouth 3 (three) times daily. [provider]  Active Self  escitalopram (LEXAPRO) 20 MG tablet [id number] Yes Take 30 mg by mouth daily. [provider] Taking Active Self  etonogestrel (NEXPLANON) 68 MG IMPL implant [id number]  68 mg by Subdermal route once. [provider]  Active Self           Med Note Nat Christen   Tue Nov 14, 2020  4:15 PM)    HYDROcodone-acetaminophen (NORCO/VICODIN) 5-325 MG tablet [id number] No Take 1 tablet by mouth every 6 (six) hours as needed.  Patient not taking: Reported on 08/24/2021   Evalee Jefferson, PA-C Not Taking Active   lidocaine (XYLOCAINE) 2 % solution [id number]  Use as directed 15 mLs in the mouth or throat as needed for mouth pain.  Patient not taking: Reported on 07/25/2021   Orpah Greek, MD  Active   Menthol, Topical Analgesic, (ICY HOT EX) [id number]  Apply 1 application topically daily as needed (pain).  Patient not taking: Reported on 07/25/2021   [provider]  Active Self  omeprazole (PRILOSEC) 40 MG capsule [id number]  Take 1 capsule (40 mg total) by mouth 2 (two) times daily. Montez Morita, Quillian Quince, MD  Active   PARoxetine (PAXIL) 40 MG tablet [id number]  TAKE 1 TABLET BY MOUTH ONCE DAILY IN THE MORNING  Patient not taking: Reported on 07/25/2021   Estill Dooms, NP  Active   pregabalin (LYRICA) 50 MG capsule [id number]  Take 1 capsule (50 mg total) by mouth at bedtime for 10 days, THEN 1 capsule (50 mg total) 2  (two) times daily.  Patient taking differently: Take 50 mg by mouth at bedtime.   Gillis Santa, MD  Expired 10/22/20 2359 Self  promethazine (PHENERGAN) 25 MG tablet [id number]  Take 1 tablet (25 mg total) by mouth every 6 (six) hours as needed for nausea or vomiting. Estill Dooms, NP  Active Self  sucralfate (CARAFATE) 1 g tablet [id number] No Take 1 tablet (1 g total) by mouth 3 (three) times daily before meals.  Patient not taking: Reported on 08/24/2021   Harvel Quale, MD Not Taking Active   traZODone (DESYREL) 50 MG tablet [id number]  Take 1 tablet (50 mg total) by mouth at bedtime.  Patient not taking: Reported on 07/25/2021   Estill Dooms, NP  Active Self  valACYclovir (VALTREX) 1000 MG tablet [id number]  Take 1 tablet (1,000 mg total) by mouth 2 (two) times daily. Estill Dooms, NP  Active             Patient Active Problem List   Diagnosis Date Noted   Atypical squamous cells of undetermined significance on cytologic smear of cervix (ASC-US) 10/31/2020   High grade squamous intraepithelial cervical dysplasia, S/P cervical conization 10/31/2020   IBS (irritable bowel syndrome) 10/30/2020   Encounter for support and coordination of transition of care 09/14/2020   Cannabis hyperemesis syndrome concurrent with and due to cannabis abuse ([place]) 09/09/2020   Chronic abdominal pain    Chronic radicular lumbar pain 08/23/2020   Chronic pain syndrome 08/23/2020   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) 08/23/2020   Post laminectomy syndrome 08/23/2020   Frequent headaches 08/10/2020   Nexplanon in place 08/10/2020   Irregular intermenstrual bleeding 08/10/2020   Obesity (BMI 30-39.9) 05/20/2020   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   Panic attack 05/18/2020   Abdominal pain, chronic, epigastric 05/18/2020   Agitated 05/18/2020   Gastroesophageal reflux disease 05/10/2020   Alternating constipation and diarrhea 05/10/2020   Bilateral  wheezing 02/22/2020   Sleep disturbance 02/22/2020   Anxiety and depression 02/19/2019   Intractable nausea and vomiting 12/07/2018   Low back pain 12/07/2018   Chronic diarrhea 12/07/2018   Condyloma 04/15/2018   History of abnormal cervical Pap smear 11/04/2017   Reflux esophagitis 04/17/2017   Tobacco abuse 04/17/2017   Peptic ulcer disease 04/13/2017   Erosive gastritis 04/11/2017   Anxiety 04/11/2017    Conditions to be addressed/monitored per PCP order:   chronic healthcare management needs, tobacco use,   history of anxiety/depression, reflux esophagitis/erosive gastritis, migraines, LBP  Care Plan : RN Care Manager Plan of Care  Updates made by Gayla Medicus, RN since 08/24/2021 12:00 AM     Problem: Care Coordination needs   Priority: High  Onset Date: 07/25/2021     Long-Range Goal: Establish Plan of Care and provide Care Coordination Needs   Start Date: 07/25/2021  Expected End Date: 10/25/2021  Priority: High  Note:   Current Barriers:  Knowledge Deficits related to plan of care  for management of Anxiety, Depression, Tobacco Use, and back pain, resources for dental and eye provider. Care Coordination needs related to Lacks knowledge of community resource: for eye and dental provider Chronic Disease Management support and education needs related to Anxiety, Depression, Tobacco Use, and back pain. Financial Constraints Update 08/24/21:  patient emailed dental and eye providers by Care Guide and patient cannot locate.  Needs to see dental provider due to tooth pain, 7 on a scale of 1-10, taking pain medications as needed.  RNCM Clinical Goal(s):  Patient will verbalize understanding of plan for management of Anxiety, Depression, Tobacco Use, and back pain verbalize basic understanding of  Anxiety, Depression, Tobacco Use, and back pain take all medications exactly as prescribed and will call provider for medication related questions demonstrate understanding of rationale  for each prescribed medication  attend all scheduled medical appointments:  continue to work with RN Care Manager to address care management and care coordination needs related to  Anxiety, Depression, Tobacco Use, and back pain work with pharmacist to address medications-appointment scheduled for 09/11/21 work with community resource care guide to address needs related to  [employer] of community resource for  dental and eye provider through collaboration with Consulting civil engineer, provider, and care team.  Patient will schedule an appointment with dental provider.  Interventions: Inter-disciplinary care team collaboration (see longitudinal plan of care) Evaluation of current treatment plan related to  self management and patient's adherence to plan as established by provider Care guide referral for dental and eye provider-completed, but patient did not receive. 08/24/21-will ask Care Guide to call patient with information at her request. Collaboration with care guide for resources Collaborated with pharmacy Pharmacy referral for medication review. Patient given 1-800-QUIT NOW number for resources.  SDOH Barriers (Status:  New goal. Patient interviewed and SDOH assessment performed        SDOH Interventions    Flowsheet Row Most Recent Value  SDOH Interventions   Food Insecurity Interventions Intervention Not Indicated  Housing Interventions Intervention Not Indicated  Transportation Interventions Intervention Not Indicated     Patient interviewed and appropriate assessments performed Referred patient to community resources care guide team for assistance with eye and dental reosurces Discussed plans with patient for ongoing care management follow up and provided patient with direct contact information for care management team  Patient Goals/Self-Care Activities: Patient will self administer medications as prescribed Patient will attend all scheduled provider appointments Patient  will call pharmacy for medication refills Patient will continue to perform ADL's independently Patient will continue to perform IADL's independently Patient will call provider office for new concerns or questions Patient will schedule an appointment with dental provider.  Follow Up Plan:  The care management team will reach out to the patient again over the next 30 days.    Evidence-based guidance:   Review biopsychosocial determinants of health screens.  Review need for preventive screening based on age, sex, family history and health history.  Determine level of modifiable health risk.  Assess level of child and caregiver activation, level of readiness, importance and confidence to make changes.  Discuss identified risks.  Identify areas where behavior change may lead to improved health.  Promote healthy lifestyle.  Evoke change talk using open-ended questions, pros and cons, as well as looking forward.  Identify and manage conditions or preconditions to reduce health risk.  Implement additional goals and interventions based on identified risk factors.  Include child's contributions based on age and understanding to self-management plan.  Support child and caregiver active participation in decision-making and self-management plan.      Follow Up:  Patient agrees to Care Plan and Follow-up.  Plan: The Managed Medicaid care management team will reach out to the patient again over the next 30 days. and The  Patient has been provided with contact information for the Managed Medicaid care management team and has been advised to call with any health related questions or concerns.  Date/time of next scheduled RN care management/care coordination outreach:  09/21/21 at 1230.

## 2021-09-05 ENCOUNTER — Other Ambulatory Visit (HOSPITAL_COMMUNITY)
Admission: RE | Admit: 2021-09-05 | Discharge: 2021-09-05 | Disposition: A | Discharge status: Home / Self Care | Payer: Medicaid Other | Source: Ambulatory Visit | Attending: Advanced Practice Midwife | Admitting: Advanced Practice Midwife

## 2021-09-05 ENCOUNTER — Ambulatory Visit (INDEPENDENT_AMBULATORY_CARE_PROVIDER_SITE_OTHER): Payer: Medicaid Other | Admitting: Advanced Practice Midwife

## 2021-09-05 ENCOUNTER — Encounter: Payer: Self-pay | Admitting: Advanced Practice Midwife

## 2021-09-05 ENCOUNTER — Other Ambulatory Visit: Payer: Self-pay

## 2021-09-05 VITALS — BP 119/78 | HR 95 | Ht 69.0 in | Wt 201.0 lb

## 2021-09-05 DIAGNOSIS — Z01419 Encounter for gynecological examination (general) (routine) without abnormal findings: Secondary | ICD-10-CM | POA: Diagnosis not present

## 2021-09-05 DIAGNOSIS — Z3046 Encounter for surveillance of implantable subdermal contraceptive: Secondary | ICD-10-CM

## 2021-09-05 DIAGNOSIS — Z975 Presence of (intrauterine) contraceptive device: Secondary | ICD-10-CM

## 2021-09-05 MED ORDER — ETONOGESTREL 68 MG ~~LOC~~ IMPL
68.0000 mg | DRUG_IMPLANT | Freq: Once | SUBCUTANEOUS | Status: AC
  Administered 2021-09-05: 13:00:00 68 mg via SUBCUTANEOUS

## 2021-09-05 NOTE — Progress Notes (Signed)
WELL-WOMAN EXAMINATION Patient name: Debbie Santana MRN [id number]  Date of birth: 02-15-1991 Chief Complaint:   Gynecologic Exam (Gets every 6 month paps per Dr. Elonda Husky)  History of Present Illness:   Debbie Santana is a 30 y.o. G96P1011 Caucasian female being seen today for a routine well-woman exam.  Current complaints: has monthly bleeding lasting approx 1 week, with last months being heavier requiring adult diapers; has had good success with megace but didn't use it last month; also notices blood in stool more frequently now- has hx of internal hemorrhoids  PCP: Dr [name], appt scheduled for 09/11/21      does not desire labs No LMP recorded. Patient has had an implant. The current method of family planning is Nexplanon (expires 09/07/21) Last pap Feb 2022. Results were: ASCUS w/ HRHPV negative. H/O abnormal pap: yes hx of HGSIL, s/p cervical conization Last mammogram: never. Results were: N/A. Family h/o breast cancer: no Last colonoscopy: never. Results were: N/A. Family h/o colorectal cancer: no  Depression screen [facility] 2/9 09/05/2021 10/31/2020 09/14/2020 08/15/2020 08/10/2020  Decreased Interest 1 2 0 1 2  Down, Depressed, Hopeless 2 3 0 1 3  PHQ - 2 Score 3 5 0 2 5  Altered sleeping 3 2 0 1 2  Tired, decreased energy 3 1 1 1 3   Change in appetite 2 3 1 1 3   Feeling bad or failure about yourself  3 3 0 0 3  Trouble concentrating 3 3 1 1 3   Moving slowly or fidgety/restless 0 0 0 0 3  Suicidal thoughts - 0 0 0 0  PHQ-9 Score 17 17 3 6 22   Difficult doing work/chores - - Not difficult at all Somewhat difficult -  Some recent data might be hidden     GAD 7 : Generalized Anxiety Score 09/05/2021 10/31/2020 08/10/2020 06/06/2020  Nervous, Anxious, on Edge 3 3 3 1   Control/stop worrying 3 3 3 1   Worry too much - different things 3 3 3 1   Trouble relaxing 3 3 3 1   Restless 3 3 3 3   Easily annoyed or irritable 3 3 3 1   Afraid - awful might happen 3 2 1 1   Total GAD 7 Score 21 20  19 9   Anxiety Difficulty - - Extremely difficult -     Review of Systems:   Pertinent items are noted in HPI Denies any headaches, blurred vision, fatigue, shortness of breath, chest pain, abdominal pain, abnormal vaginal discharge/itching/odor/irritation, problems with periods, bowel movements, urination, or intercourse unless otherwise stated above. Pertinent History Reviewed:  Reviewed past medical,surgical, social and family history.  Reviewed problem list, medications and allergies. Physical Assessment:   Vitals:   09/05/21 1043  BP: 119/78  Pulse: 95  Weight: 201 lb (91.2 kg)  Height: 5\' 9"  (1.753 m)  Body mass index is 29.68 kg/m.        Physical Examination:   General appearance - well appearing, and in no distress  Mental status - alert, oriented to person, place, and time  Psych:  She has a normal mood and affect  Skin - warm and dry, normal color, no suspicious lesions noted  Chest - effort normal, all lung fields clear to auscultation bilaterally  Heart - normal rate and regular rhythm  Neck:  midline trachea, no thyromegaly or nodules  Breasts - breasts appear normal, no suspicious masses, no skin or nipple changes or  axillary nodes  Abdomen - soft, nontender, nondistended, no masses or organomegaly  Pelvic - VULVA: normal appearing vulva with no masses, tenderness or lesions  VAGINA: normal appearing vagina with normal color and discharge, no lesions  CERVIX: normal appearing cervix without discharge or lesions, no CMT  Thin prep pap is done with HR HPV cotesting  UTERUS: uterus is felt to be normal size, shape, consistency and nontender   ADNEXA: No adnexal masses or tenderness noted.  Extremities:  No swelling or varicosities noted  Chaperone: Celene Squibb    No results found for this or any previous visit (from the past 24 hour(s)).  Assessment & Plan:  1) Well-Woman Exam  2) Hx abnl pap, paps q 6 months per [facility]  Labs/procedures today: Pap & Nex  removal/reinsertion  Mammogram: @ 31yo, or sooner if problems Colonoscopy: @ 31yo, or sooner if problems  No orders of the defined types were placed in this encounter.   Meds: No orders of the defined types were placed in this encounter.   Follow-up: Return in about 6 months (around 03/06/2022) for pap only; [facility].    NEXPLANON REMOVAL AND RE-INSERTION  Risks/benefits/side effects of Nexplanon have been discussed and her questions have been answered.  Specifically, a failure rate of 09/998 has been reported, with an increased failure rate if pt takes [place] and/or antiseizure medicaitons.  She is aware of the common side effect of irregular bleeding, which the incidence of decreases over time. Signed copy of informed consent in chart.   Time out was performed.  Nexplanon site identified.  Area prepped in usual sterile fashon. Two cc's of 2% lidocaine was used to anesthetize the area. A small stab incision was made right beside the implant on the distal portion.  The Nexplanon rod was grasped using hemostats and removed intact without difficulty.  The area was cleansed again with betadine and the Nexplanon was inserted approximately 10cm from the medial epicondyle and 3-5cm posterior to the sulcus per manufacturer's recommendations without difficulty.  A pressure bandage was applied.  There was less than 3 cc blood loss. There were no complications.  The patient tolerated the procedure well. Assessment & Plan:   1) Nexplanon removal & re-insertion She was instructed to keep the area clean and dry, remove pressure bandage in 24 hours, and keep insertion site covered with the steri-strips for 3-5 days.  She was given a card indicating date Nexplanon was inserted and date it needs to be removed.  Follow-up PRN problems.  No orders of the defined types were placed in this encounter.   Follow-up: Return in about 6 months (around 03/06/2022) for pap only; [facility].   Myrtis Ser  CNM 09/05/2021 11:30 AM

## 2021-09-06 ENCOUNTER — Emergency Department (HOSPITAL_COMMUNITY): Payer: Medicaid Other

## 2021-09-06 ENCOUNTER — Emergency Department (HOSPITAL_COMMUNITY)
Admission: EM | Admit: 2021-09-06 | Discharge: 2021-09-06 | Disposition: A | Discharge status: Home / Self Care | Payer: Medicaid Other | Attending: Emergency Medicine | Admitting: Emergency Medicine

## 2021-09-06 ENCOUNTER — Encounter (HOSPITAL_COMMUNITY): Payer: Self-pay

## 2021-09-06 DIAGNOSIS — K029 Dental caries, unspecified: Secondary | ICD-10-CM | POA: Diagnosis not present

## 2021-09-06 DIAGNOSIS — M545 Low back pain, unspecified: Secondary | ICD-10-CM | POA: Insufficient documentation

## 2021-09-06 DIAGNOSIS — K625 Hemorrhage of anus and rectum: Secondary | ICD-10-CM | POA: Diagnosis not present

## 2021-09-06 DIAGNOSIS — F17210 Nicotine dependence, cigarettes, uncomplicated: Secondary | ICD-10-CM | POA: Diagnosis not present

## 2021-09-06 DIAGNOSIS — G8929 Other chronic pain: Secondary | ICD-10-CM

## 2021-09-06 LAB — BASIC METABOLIC PANEL
Anion gap: 5 (ref 5–15)
BUN: 15 mg/dL (ref 6–20)
CO2: 25 mmol/L (ref 22–32)
Calcium: 8.6 mg/dL — ABNORMAL LOW (ref 8.9–10.3)
Chloride: 102 mmol/L (ref 98–111)
Creatinine, Ser: 0.67 mg/dL (ref 0.44–1.00)
GFR, Estimated: >60 mL/min (ref >60)
Glucose, Bld: 94 mg/dL (ref 70–99)
Potassium: 3.4 mmol/L — ABNORMAL LOW (ref 3.5–5.1)
Sodium: 132 mmol/L — ABNORMAL LOW (ref 135–145)

## 2021-09-06 LAB — CBC
HCT: 38.6 % (ref 36.0–46.0)
Hemoglobin: 13.1 g/dL (ref 12.0–15.0)
MCH: 32.8 pg (ref 26.0–34.0)
MCHC: 33.9 g/dL (ref 30.0–36.0)
MCV: 96.5 fL (ref 80.0–100.0)
Platelets: 216 10*3/uL (ref 150–400)
RBC: 4 MIL/uL (ref 3.87–5.11)
RDW: 12.9 % (ref 11.5–15.5)
WBC: 8.9 10*3/uL (ref 4.0–10.5)
nRBC: 0 % (ref 0.0–0.2)

## 2021-09-06 LAB — PREGNANCY, URINE: Preg Test, Ur: NEGATIVE

## 2021-09-06 LAB — POC OCCULT BLOOD, ED

## 2021-09-06 MED ORDER — OXYCODONE-ACETAMINOPHEN 5-325 MG PO TABS
1.0000 | ORAL_TABLET | Freq: Once | ORAL | Status: AC
  Administered 2021-09-06: 1 via ORAL
  Filled 2021-09-06: qty 1

## 2021-09-06 MED ORDER — DEXAMETHASONE SODIUM PHOSPHATE 10 MG/ML IJ SOLN
6.0000 mg | Freq: Once | INTRAMUSCULAR | Status: AC
  Administered 2021-09-06: 6 mg via INTRAMUSCULAR
  Filled 2021-09-06: qty 1

## 2021-09-06 MED ORDER — CELECOXIB 200 MG PO CAPS: 200.0000 mg | ORAL_CAPSULE | Freq: Two times a day (BID) | ORAL | 0 refills | Status: DC

## 2021-09-06 NOTE — ED Triage Notes (Signed)
Pt arrived with multiple complaints, pt complaining of mouth pain needing all her teeth removed she states, complaint of back pain, and having red blood tinged stool for months.  Pt alert and oriented, skin warm and dry.

## 2021-09-06 NOTE — ED Provider Notes (Signed)
Received at shift change from Margarita Mail, PA-C please see her note for full detail,  Patient with no symptom medical history presents with multiple pain complaints.  Work-up reveals CBC which is unremarkable, BMP shows sodium of 132, potassium 3.4, calcium 8.6, urine pregnancy negative, positive Hemoccults, DG of lumbar spine negative for acute findings.  Per previous provider follow-up on urine pregnancy, if negative provide her with Decadron and she can be discharged home.  Patient was updated on lab work and imaging, has no complaints this time, patient agreed for discharge.  Narcotic medication will be deferred at this time as she was given a prescription of Norco on the second of this month which should last her for 28 days.  Come back to the emergency department if you develop chest pain, shortness of breath, severe abdominal pain, uncontrolled nausea, vomiting, diarrhea.    Marcello Fennel, PA-C 09/06/21 1956    Sherwood Gambler, MD 09/07/21 2224
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
## 2021-09-06 NOTE — Discharge Instructions (Signed)
Follow up with a primary care doctor about your back, a dentist about your teeth, and the GI specialist about your rectal bleeding. Get help right away if: You have new bleeding. You have more bleeding than before. You have black or dark red poop. You vomit blood or something that looks like coffee grounds. You pass out (faint). You have very bad pain in your butt.

## 2021-09-07 ENCOUNTER — Telehealth: Payer: Self-pay

## 2021-09-07 NOTE — Telephone Encounter (Signed)
Transition Care Management Unsuccessful Follow-up Telephone Call  Date of discharge and from where:  09/06/2021-[place]   Attempts:  1st Attempt  Reason for unsuccessful TCM follow-up call:  Unable to reach patient

## 2021-09-10 LAB — CYTOLOGY - PAP
Chlamydia: NEGATIVE
Comment: NEGATIVE
Comment: NEGATIVE
Comment: NORMAL
Diagnosis: UNDETERMINED — AB
High risk HPV: NEGATIVE
Neisseria Gonorrhea: NEGATIVE

## 2021-09-11 ENCOUNTER — Telehealth: Payer: Self-pay | Admitting: Pharmacist

## 2021-09-11 ENCOUNTER — Ambulatory Visit: Payer: Self-pay

## 2021-09-11 NOTE — Patient Outreach (Incomplete)
09/11/2021 Name: CELENA LANIUS MRN: [id number] DOB: Jan 30, 1991  Referred by: Leslie Andrea, MD Reason for referral : No chief complaint on file.   {MANAGED MEDICAID OUTREACH UNSUCCESSFUL:[id number]}   Follow Up Plan: {MANAGED MEDICAID FOLLOW UP PLAN:[id number]}    ***SIGNATURE

## 2021-09-11 NOTE — Telephone Encounter (Signed)
Transition Care Management Unsuccessful Follow-up Telephone Call  Date of discharge and from where:  09/06/2021 from [facility]  Attempts:  2nd Attempt  Reason for unsuccessful TCM follow-up call:  Unable to reach patient

## 2021-09-12 NOTE — Telephone Encounter (Signed)
Transition Care Management Follow-up Telephone Call Date of discharge and from where: 09/07/2021 from [facility] How have you been since you were released from the hospital? Pt stated that she is still in pain but did not have any questions and will follow up the other providers.  Any questions or concerns? No  Items Reviewed: Did the pt receive and understand the discharge instructions provided? Yes  Medications obtained and verified? Yes  Other? No  Any new allergies since your discharge? No  Dietary orders reviewed? No Do you have support at home? Yes   Functional Questionnaire: (I = Independent and D = Dependent) ADLs: I  Bathing/Dressing- I  Meal Prep- I  Eating- I  Maintaining continence- I  Transferring/Ambulation- I  Managing Meds- I   Follow up appointments reviewed:  PCP Hospital f/u appt confirmed? Yes  PCP outside of [facility] f/u appt confirmed? No  Pt will call GI for an appt.  Are transportation arrangements needed? No  If their condition worsens, is the pt aware to call PCP or go to the Emergency Dept.? Yes Was the patient provided with contact information for the PCP's office or ED? Yes Was to pt encouraged to call back with questions or concerns? Yes

## 2021-09-19 DIAGNOSIS — K0889 Other specified disorders of teeth and supporting structures: Secondary | ICD-10-CM | POA: Diagnosis not present

## 2021-09-21 ENCOUNTER — Other Ambulatory Visit: Payer: Self-pay

## 2021-09-21 ENCOUNTER — Other Ambulatory Visit: Payer: Self-pay | Admitting: Obstetrics and Gynecology

## 2021-09-21 NOTE — Patient Instructions (Signed)
Hi Debbie Santana, thank you for speaking to me-I hope you feel better soon!  Debbie Santana was given information about Medicaid Managed Care team care coordination services as a part of their Healthy [facility] Medicaid benefit. Debbie Santana verbally consented to engagement with the [facility] Managed Care team.   If you are experiencing a medical emergency, please call 911 or report to your local emergency department or urgent care.   If you have a non-emergency medical problem during routine business hours, please contact your provider's office and ask to speak with a nurse.   For questions related to your Healthy [facility] health plan, please call: [phone] or visit the homepage here: [email or web address]  If you would like to schedule transportation through your Healthy [facility] plan, please call the following number at least 2 days in advance of your appointment: [phone]  Call the [place] at [phone], at any time, 24 hours a day, 7 days a week. If you are in danger or need immediate medical attention call 911.  If you would like help to quit smoking, call 1-800-QUIT-NOW (970)515-3866) OR Espaol: 1-855-Djelo-Ya (2-706-237-6283) o para ms informacin haga clic aqu or Text READY to 200-400 to register via text  Debbie Santana - following are the goals we discussed in your visit today:   Goals Addressed      DOH Barriers (Status:  New goal. Patient interviewed and SDOH assessment performed        SDOH Interventions    Flowsheet Row Most Recent Value  SDOH Interventions   Food Insecurity Interventions Intervention Not Indicated  Housing Interventions Intervention Not Indicated  Transportation Interventions Intervention Not Indicated      Patient interviewed and appropriate assessments performed Referred patient to community resources care guide team for assistance with eye and dental reosurces Discussed plans with  patient for ongoing care management follow up and provided patient with direct contact information for care management tea  The patient verbalized understanding of instructions provided today and declined a print copy of patient instruction materials.   The Managed Medicaid care management team will reach out to the patient again over the next 30 days.  The  Patient has been provided with contact information for the Managed Medicaid care management team and has been advised to call with any health related questions or concerns.   Aida Raider RN, BSN [place] Management Coordinator - Managed Medicaid High Risk [phone]   Following is a copy of your plan of care:  Care Plan : [place] of Care  Updates made by Gayla Medicus, RN since 09/21/2021 12:00 AM     Problem: Care Coordination needs   Priority: High  Onset Date: 07/25/2021     Long-Range Goal: Establish Plan of Care and provide Care Coordination Needs   Start Date: 07/25/2021  Expected End Date: 10/25/2021  Priority: High  Note:   Current Barriers:  Knowledge Deficits related to plan of care for management of Anxiety, Depression, Tobacco Use, and back pain, resources for  eye provider. Care Coordination needs related to Lacks knowledge of community resource: for eye provider Chronic Disease Management support and education needs related to Anxiety, Depression, Tobacco Use, and back pain. Financial Constraints Update 09/21/21:  patient states she has not received eye provider information yet, dental pain continues-has appointment with oral surgeon next week.  RNCM Clinical Goal(s):  Patient will verbalize understanding of plan for management of Anxiety, Depression, Tobacco  Use, and back pain verbalize basic understanding of  Anxiety, Depression, Tobacco Use, and back pain take all medications exactly as prescribed and will call provider for medication related  questions demonstrate understanding of rationale for each prescribed medication  attend all scheduled medical appointments:  continue to work with [employer] to address care management and care coordination needs related to  Anxiety, Depression, Tobacco Use, and back pain work with pharmacist to address medications work with [employer] care guide to address needs related to  [employer] of Data processing manager for  dental and eye provider through collaboration with Consulting civil engineer, provider, and care team.  Patient will schedule an appointment with dental provider-completed  Interventions: Inter-disciplinary care team collaboration (see longitudinal plan of care) Evaluation of current treatment plan related to  self management and patient's adherence to plan as established by provider Care guide/ BSW  referral for  eye provider 09/21/21-patient states she no longer needs dental provider information, still needs eye provider information-will refer. Collaboration with care guide and BSW  for resources Collaborated with pharmacy Pharmacy referral for medication review. Patient given 1-800-QUIT NOW number for resources.  SDOH Barriers (Status:  New goal. Patient interviewed and SDOH assessment performed        SDOH Interventions    Flowsheet Row Most Recent Value  SDOH Interventions   Food Insecurity Interventions Intervention Not Indicated  Housing Interventions Intervention Not Indicated  Transportation Interventions Intervention Not Indicated     Patient interviewed and appropriate assessments performed Referred patient to community resources care guide team for assistance with eye and dental reosurces Discussed plans with patient for ongoing care management follow up and provided patient with direct contact information for care management team  Patient Goals/Self-Care Activities: Patient will self administer medications as prescribed Patient will attend all  scheduled provider appointments Patient will call pharmacy for medication refills Patient will continue to perform ADL's independently Patient will continue to perform IADL's independently Patient will call provider office for new concerns or questions Patient will schedule an appointment with dental provider-completed  Follow Up Plan:  The care management team will reach out to the patient again over the next 30 days.    Evidence-based guidance:   Review biopsychosocial determinants of health screens.  Review need for preventive screening based on age, sex, family history and health history.  Determine level of modifiable health risk.  Assess level of child and caregiver activation, level of readiness, importance and confidence to make changes.  Discuss identified risks.  Identify areas where behavior change may lead to improved health.  Promote healthy lifestyle.  Evoke change talk using open-ended questions, pros and cons, as well as looking forward.  Identify and manage conditions or preconditions to reduce health risk.  Implement additional goals and interventions based on identified risk factors.  Include child's contributions based on age and understanding to self-management plan.  Support child and caregiver active participation in decision-making and self-management plan.

## 2021-09-21 NOTE — Patient Outreach (Signed)
Medicaid Managed Care   Nurse Care Manager Note  09/21/2021 Name:  Debbie Santana MRN:  [id number] DOB:  May 31, 1991  Debbie Santana is an 30 y.o. year old female who is a primary patient of Debbie Andrea, MD.  The [facility] Managed Care Coordination team was consulted for assistance with:    Chronic healthcare management needs  Ms. Debbie Santana was given information about Medicaid Managed Care Coordination team services today. Debbie Santana Patient agreed to services and verbal consent obtained.  Engaged with patient by telephone for follow up visit in response to provider referral for case management and/or care coordination services.   Assessments/Interventions:  Review of past medical history, allergies, medications, health status, including review of consultants reports, laboratory and other test data, was performed as part of comprehensive evaluation and provision of chronic care management services.  SDOH (Social Determinants of Health) assessments and interventions performed: SDOH Interventions    Flowsheet Row Most Recent Value  SDOH Interventions   Housing Interventions Intervention Not Indicated       Care Plan  Allergies  Allergen Reactions   Nsaids Other (See Comments)    History of ulcers    Medications Reviewed Today     Reviewed by Gayla Medicus, RN (Registered Nurse) on 09/21/21 at 1228  Med List Status: <None>   Medication Order Taking? Sig Documenting Provider Last Dose Status Informant  albuterol (VENTOLIN HFA) 108 (90 Base) MCG/ACT inhaler [id number] Yes Inhale 2 puffs into the lungs every 6 (six) hours as needed for wheezing or shortness of breath. Orson Eva, MD Taking Active Self  amoxicillin (AMOXIL) 500 MG capsule [id number] Yes Take 1 capsule (500 mg total) by mouth 3 (three) times daily. Debbie Jefferson, PA-C Taking Active   busPIRone (BUSPAR) 15 MG tablet [id number]  Take 1 tablet (15 mg total) by mouth 3 (three) times daily.  Patient not taking: Reported  on 07/25/2021   Debbie Dooms, NP  Active Self  butalbital-acetaminophen-caffeine (FIORICET) 50-325-40 MG tablet [id number]  TAKE 1 TABLET BY MOUTH EVERY 6 HOURS AS NEEDED FOR MIGRAINE HEADACHE Derrek Monaco A, NP  Active   celecoxib (CELEBREX) 200 MG capsule [id number]  Take 1 capsule (200 mg total) by mouth 2 (two) times daily. Margarita Mail, PA-C  Active   chlorhexidine (PERIDEX) 0.12 % solution [id number]  Use as directed 15 mLs in the mouth or throat 2 (two) times daily.  Patient not taking: Reported on 07/25/2021   Debbie Greek, MD  Active   clindamycin (CLEOCIN) 300 MG capsule [id number]  Take 300 mg by mouth 3 (three) times daily. [provider]  Active   clonazePAM (KLONOPIN) 1 MG tablet [id number]  Take 1 mg by mouth 3 (three) times daily. [provider]  Active Self  escitalopram (LEXAPRO) 20 MG tablet [id number]  Take 30 mg by mouth daily. [provider]  Active Self  etonogestrel (NEXPLANON) 68 MG IMPL implant [id number] Yes 68 mg by Subdermal route once. [provider] Taking Active Self           Med Note Nat Christen   Tue Nov 14, 2020  4:15 PM)    famotidine (PEPCID) 20 MG tablet [id number]  Take 20 mg by mouth 2 (two) times daily. [provider]  Active   HYDROcodone-acetaminophen (NORCO/VICODIN) 5-325 MG tablet [id number]  Take 1 tablet by mouth every 6 (six) hours as needed.  Patient taking differently: Take 1 tablet by mouth every 6 (six) hours as needed. Taking  10 mg, not 5   Idol, Almyra Free, [place]  Active Self  lidocaine (XYLOCAINE) 2 % solution [id number]  Use as directed 15 mLs in the mouth or throat as needed for mouth pain.  Patient not taking: Reported on 07/25/2021   Debbie Greek, MD  Active   Menthol, Topical Analgesic, (ICY HOT EX) [id number]  Apply 1 application topically daily as needed (pain).  Patient not taking: Reported on 07/25/2021   [provider]  Active Self  omeprazole  (PRILOSEC) 40 MG capsule [id number]  Take 1 capsule (40 mg total) by mouth 2 (two) times daily.  Patient not taking: Reported on 09/05/2021   Montez Morita, Quillian Quince, MD  Active   ondansetron [facility] Dt) 8 MG tablet [id number]  SMARTSIG:1 Tablet(s) By Mouth 1-3 Times Daily [provider]  Active   PARoxetine (PAXIL) 40 MG tablet [id number]  TAKE 1 TABLET BY MOUTH ONCE DAILY IN THE MORNING  Patient not taking: Reported on 07/25/2021   Debbie Dooms, NP  Active   pregabalin (LYRICA) 50 MG capsule [id number]  Take 1 capsule (50 mg total) by mouth at bedtime for 10 days, THEN 1 capsule (50 mg total) 2 (two) times daily.  Patient taking differently: Take 50 mg by mouth at bedtime.   Debbie Santa, MD  Expired 10/22/20 2359 Self  promethazine (PHENERGAN) 25 MG tablet [id number] Yes Take 1 tablet (25 mg total) by mouth every 6 (six) hours as needed for nausea or vomiting. Debbie Dooms, NP Taking Active Self  sucralfate (CARAFATE) 1 g tablet [id number]  Take 1 tablet (1 g total) by mouth 3 (three) times daily before meals. Montez Morita, Quillian Quince, MD  Active   traZODone (DESYREL) 50 MG tablet [id number]  Take 1 tablet (50 mg total) by mouth at bedtime.  Patient not taking: Reported on 07/25/2021   Debbie Dooms, NP  Active Self  valACYclovir (VALTREX) 1000 MG tablet [id number]  Take 1 tablet (1,000 mg total) by mouth 2 (two) times daily. Debbie Dooms, NP  Active             Patient Active Problem List   Diagnosis Date Noted   Atypical squamous cells of undetermined significance on cytologic smear of cervix (ASC-US) 10/31/2020   High grade squamous intraepithelial cervical dysplasia, S/P cervical conization 10/31/2020   IBS (irritable bowel syndrome) 10/30/2020   Encounter for support and coordination of transition of care 09/14/2020   Cannabis hyperemesis syndrome concurrent with and due to cannabis abuse ([place]) 09/09/2020   Chronic abdominal pain    Chronic radicular  lumbar pain 08/23/2020   Chronic pain syndrome 08/23/2020   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) 08/23/2020   Post laminectomy syndrome 08/23/2020   Frequent headaches 08/10/2020   Nexplanon in place 08/10/2020   Irregular intermenstrual bleeding 08/10/2020   Obesity (BMI 30-39.9) 05/20/2020   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   Panic attack 05/18/2020   Abdominal pain, chronic, epigastric 05/18/2020   Agitated 05/18/2020   Gastroesophageal reflux disease 05/10/2020   Alternating constipation and diarrhea 05/10/2020   Bilateral wheezing 02/22/2020   Sleep disturbance 02/22/2020   Anxiety and depression 02/19/2019   Intractable nausea and vomiting 12/07/2018   Low back pain 12/07/2018   Chronic diarrhea 12/07/2018   Condyloma 04/15/2018   History of abnormal cervical Pap smear 11/04/2017   Reflux esophagitis 04/17/2017   Tobacco abuse 04/17/2017   Peptic ulcer disease 04/13/2017   Erosive gastritis 04/11/2017   Anxiety 04/11/2017    Conditions  to be addressed/monitored per PCP order:   chronic healthcare management needs, tobacco use, anxiety/depression, reflux, migraines, LBP  Care Plan : RN Care Manager Plan of Care  Updates made by Gayla Medicus, RN since 09/21/2021 12:00 AM     Problem: Care Coordination needs   Priority: High  Onset Date: 07/25/2021     Long-Range Goal: Establish Plan of Care and provide Care Coordination Needs   Start Date: 07/25/2021  Expected End Date: 10/25/2021  Priority: High  Note:   Current Barriers:  Knowledge Deficits related to plan of care for management of Anxiety, Depression, Tobacco Use, and back pain, resources for  eye provider. Care Coordination needs related to Lacks knowledge of community resource: for eye provider Chronic Disease Management support and education needs related to Anxiety, Depression, Tobacco Use, and back pain. Financial Constraints Update 09/21/21:  patient states she has not received eye  provider information yet, dental pain continues-has appointment with oral surgeon next week.  RNCM Clinical Goal(s):  Patient will verbalize understanding of plan for management of Anxiety, Depression, Tobacco Use, and back pain verbalize basic understanding of  Anxiety, Depression, Tobacco Use, and back pain take all medications exactly as prescribed and will call provider for medication related questions demonstrate understanding of rationale for each prescribed medication  attend all scheduled medical appointments:  continue to work with RN Care Manager to address care management and care coordination needs related to  Anxiety, Depression, Tobacco Use, and back pain work with pharmacist to address medications work with [employer] care guide to address needs related to  [employer] of Data processing manager for  dental and eye provider through collaboration with Consulting civil engineer, provider, and care team.  Patient will schedule an appointment with dental provider-completed  Interventions: Inter-disciplinary care team collaboration (see longitudinal plan of care) Evaluation of current treatment plan related to  self management and patient's adherence to plan as established by provider Care guide/ BSW  referral for  eye provider 09/21/21-patient states she no longer needs dental provider information, still needs eye provider information-will refer. Collaboration with care guide and BSW  for resources Collaborated with pharmacy Pharmacy referral for medication review. Patient given 1-800-QUIT NOW number for resources.  SDOH Barriers (Status:  New goal. Patient interviewed and SDOH assessment performed        SDOH Interventions    Flowsheet Row Most Recent Value  SDOH Interventions   Food Insecurity Interventions Intervention Not Indicated  Housing Interventions Intervention Not Indicated  Transportation Interventions Intervention Not Indicated     Patient interviewed and  appropriate assessments performed Referred patient to community resources care guide team for assistance with eye and dental reosurces Discussed plans with patient for ongoing care management follow up and provided patient with direct contact information for care management team  Patient Goals/Self-Care Activities: Patient will self administer medications as prescribed Patient will attend all scheduled provider appointments Patient will call pharmacy for medication refills Patient will continue to perform ADL's independently Patient will continue to perform IADL's independently Patient will call provider office for new concerns or questions Patient will schedule an appointment with dental provider-completed  Follow Up Plan:  The care management team will reach out to the patient again over the next 30 days.    Evidence-based guidance:   Review biopsychosocial determinants of health screens.  Review need for preventive screening based on age, sex, family history and health history.  Determine level of modifiable health risk.  Assess level of child and caregiver  activation, level of readiness, importance and confidence to make changes.  Discuss identified risks.  Identify areas where behavior change may lead to improved health.  Promote healthy lifestyle.  Evoke change talk using open-ended questions, pros and cons, as well as looking forward.  Identify and manage conditions or preconditions to reduce health risk.  Implement additional goals and interventions based on identified risk factors.  Include child's contributions based on age and understanding to self-management plan.  Support child and caregiver active participation in decision-making and self-management plan.      Follow Up:  Patient agrees to Care Plan and Follow-up.  Plan: The Managed Medicaid care management team will reach out to the patient again over the next 30 days. and The  Patient has been provided with contact  information for the Managed Medicaid care management team and has been advised to call with any health related questions or concerns.  Date/time of next scheduled RN care management/care coordination outreach:  10/22/21 at 1030.

## 2021-09-23 HISTORY — PX: MOUTH SURGERY: SHX715

## 2021-09-25 ENCOUNTER — Telehealth: Payer: Self-pay | Admitting: Family Medicine

## 2021-09-25 NOTE — Telephone Encounter (Signed)
.. °  Medicaid Managed Care   Unsuccessful Outreach Note  09/25/2021 Name: Debbie Santana MRN: [id number] DOB: 07/05/1991  Referred by: Leslie Andrea, MD Reason for referral : High Risk Managed Medicaid (I called the patient today to get her phone visit with the MM Pharmacist rescheduled and to schedule her a phone visit with the MM BSW. No one answered and I was not able to leave a message.)   An unsuccessful telephone outreach was attempted today. The patient was referred to the case management team for assistance with care management and care coordination.   Follow Up Plan: The care management team will reach out to the patient again over the next 7 days.   [place]

## 2021-10-18 ENCOUNTER — Telehealth: Payer: Self-pay | Admitting: Family Medicine

## 2021-10-19 ENCOUNTER — Other Ambulatory Visit (HOSPITAL_COMMUNITY)
Admission: RE | Admit: 2021-10-19 | Discharge: 2021-10-19 | Disposition: A | Discharge status: Home / Self Care | Payer: Medicaid Other | Source: Other Acute Inpatient Hospital | Attending: Family Medicine | Admitting: Family Medicine

## 2021-10-19 DIAGNOSIS — N920 Excessive and frequent menstruation with regular cycle: Secondary | ICD-10-CM | POA: Diagnosis not present

## 2021-10-19 DIAGNOSIS — Z5181 Encounter for therapeutic drug level monitoring: Secondary | ICD-10-CM | POA: Diagnosis not present

## 2021-10-19 DIAGNOSIS — Z79891 Long term (current) use of opiate analgesic: Secondary | ICD-10-CM | POA: Diagnosis not present

## 2021-10-19 DIAGNOSIS — K029 Dental caries, unspecified: Secondary | ICD-10-CM | POA: Diagnosis not present

## 2021-10-19 DIAGNOSIS — R197 Diarrhea, unspecified: Secondary | ICD-10-CM | POA: Diagnosis not present

## 2021-10-19 DIAGNOSIS — M5416 Radiculopathy, lumbar region: Secondary | ICD-10-CM | POA: Diagnosis not present

## 2021-10-19 LAB — RAPID URINE DRUG SCREEN, HOSP PERFORMED
Amphetamines: NOT DETECTED
Barbiturates: NOT DETECTED
Benzodiazepines: POSITIVE — AB
Cocaine: NOT DETECTED
Opiates: POSITIVE — AB
Tetrahydrocannabinol: POSITIVE — AB

## 2021-10-22 ENCOUNTER — Other Ambulatory Visit: Payer: Self-pay

## 2021-10-22 ENCOUNTER — Other Ambulatory Visit: Payer: Self-pay | Admitting: Obstetrics and Gynecology

## 2021-10-22 NOTE — Patient Outreach (Signed)
Care Coordination  10/22/2021  Debbie Santana 05-10-1991 [id number]  RNCM called patient at scheduled time.  Patient's Father answered and stated patient and her Mother had just left to go to an appointment.  RNCM rescheduled appointment.  Aida Raider RN, BSN [place]   [facility] Curator - Managed Medicaid High Risk [phone].

## 2021-10-24 ENCOUNTER — Encounter (INDEPENDENT_AMBULATORY_CARE_PROVIDER_SITE_OTHER): Payer: Self-pay | Admitting: *Deleted

## 2021-10-25 ENCOUNTER — Other Ambulatory Visit: Payer: Self-pay | Admitting: Obstetrics and Gynecology

## 2021-10-25 ENCOUNTER — Other Ambulatory Visit: Payer: Self-pay

## 2021-10-25 DIAGNOSIS — Z599 Problem related to housing and economic circumstances, unspecified: Secondary | ICD-10-CM

## 2021-10-25 NOTE — Patient Instructions (Signed)
Hi Ms. Dulski you for speaking with me-I will call you back 11/05/21 as you requested.  Hope your appointment goes well this afternoon.  Ms. Bastedo was given information about Medicaid Managed Care team care coordination services as a part of their Healthy [facility] Medicaid benefit. [name] verbally consented to engagement with the [facility] Managed Care team.   If you are experiencing a medical emergency, please call 911 or report to your local emergency department or urgent care.   If you have a non-emergency medical problem during routine business hours, please contact your provider's office and ask to speak with a nurse.   For questions related to your Healthy [facility] health plan, please call: [phone] or visit the homepage here: [email or web address]  If you would like to schedule transportation through your Healthy [facility] plan, please call the following number at least 2 days in advance of your appointment: [phone]  Call the [facility] at [phone], at any time, 24 hours a day, 7 days a week. If you are in danger or need immediate medical attention call 911.  If you would like help to quit smoking, call 1-800-QUIT-NOW (647)568-0894) OR Espaol: 1-855-Djelo-Ya (7-989-211-9417) o para ms informacin haga clic aqu or Text READY to 200-400 to register via text  Ms. Divirgilio - following are the goals we discussed in your visit today:   Goals Addressed   None   Patient will self administer medications as prescribed Patient will attend all scheduled provider appointments Patient will call pharmacy for medication refills Patient will continue to perform ADL's independently Patient will continue to perform IADL's independently Patient will call provider office for new concerns or questions  The patient verbalized understanding of instructions provided today and declined a print copy of patient instruction  materials.   The Managed Medicaid care management team will reach out to the patient 11/05/21 as she requested. The  Patient has been provided with contact information for the Managed Medicaid care management team and has been advised to call with any health related questions or concerns.   Aida Raider RN, BSN [place] Management Coordinator - Managed Medicaid High Risk [phone]   Following is a copy of your plan of care:  Care Plan : [place] of Care  Updates made by Gayla Medicus, RN since 10/25/2021 12:00 AM     Problem: Care Coordination needs   Priority: High  Onset Date: 07/25/2021     Long-Range Goal: Establish Plan of Care and provide Care Coordination Needs   Start Date: 07/25/2021  Expected End Date: 10/25/2021  Priority: High  Note:   Current Barriers:  Knowledge Deficits related to plan of care for management of Anxiety, Depression, Tobacco Use, and back pain, resources for  eye provider. Care Coordination needs related to Lacks knowledge of community resource: for eye provider Chronic Disease Management support and education needs related to Anxiety, Depression, Tobacco Use, and back pain. Financial Constraints Update 10/25/21:  patient states she is awaiting dentures and eye provider information.  Has appointment with neurologist this afternoon for hands shaking-asked to be called back 11/05/21.  RNCM Clinical Goal(s):  Patient will verbalize understanding of plan for management of Anxiety, Depression, Tobacco Use, and back pain verbalize basic understanding of  Anxiety, Depression, Tobacco Use, and back pain take all medications exactly as prescribed and will call provider for medication related questions demonstrate understanding of rationale for each prescribed medication  attend all  scheduled medical appointments:  continue to work with RN Care Manager to address care management and care coordination needs related to   Anxiety, Depression, Tobacco Use, and back pain work with pharmacist to address medications work with [employer] care guide to address needs related to  [employer] of Data processing manager for  dental and eye provider through collaboration with Consulting civil engineer, provider, and care team.  Patient will schedule an appointment with dental provider-completed  Interventions: Inter-disciplinary care team collaboration (see longitudinal plan of care) Evaluation of current treatment plan related to  self management and patient's adherence to plan as established by provider Care guide/ BSW  referral for  eye provider 09/21/21-patient states she no longer needs dental provider information, still needs eye provider information-will refer. Collaboration with care guide and BSW  for resources Collaborated with pharmacy Pharmacy referral for medication review. Patient given 1-800-QUIT NOW number for resources.  SDOH Barriers (Status:  New goal. Patient interviewed and SDOH assessment performed        SDOH Interventions    Flowsheet Row Most Recent Value  SDOH Interventions   Food Insecurity Interventions Intervention Not Indicated  Housing Interventions Intervention Not Indicated  Transportation Interventions Intervention Not Indicated     Patient interviewed and appropriate assessments performed Referred patient to community resources care guide team for assistance with eye and dental reosurces Discussed plans with patient for ongoing care management follow up and provided patient with direct contact information for care management team  Patient Goals/Self-Care Activities: Patient will self administer medications as prescribed Patient will attend all scheduled provider appointments Patient will call pharmacy for medication refills Patient will continue to perform ADL's independently Patient will continue to perform IADL's independently Patient will call provider office for new  concerns or questions Patient will schedule an appointment with dental provider-completed  Follow Up Plan:  The care management team will reach out to the patient again over the next 30 days.   Evidence-based guidance:   Review biopsychosocial determinants of health screens.  Review need for preventive screening based on age, sex, family history and health history.  Determine level of modifiable health risk.  Assess level of child and caregiver activation, level of readiness, importance and confidence to make changes.  Discuss identified risks.  Identify areas where behavior change may lead to improved health.  Promote healthy lifestyle.  Evoke change talk using open-ended questions, pros and cons, as well as looking forward.  Identify and manage conditions or preconditions to reduce health risk.  Implement additional goals and interventions based on identified risk factors.  Include child's contributions based on age and understanding to self-management plan.  Support child and caregiver active participation in decision-making and self-management plan.

## 2021-10-29 ENCOUNTER — Telehealth: Payer: Self-pay | Admitting: *Deleted

## 2021-10-29 NOTE — Telephone Encounter (Signed)
° °  Telephone encounter was:  Unsuccessful.  10/29/2021 Name: Debbie Santana MRN: [id number] DOB: 1990-10-18  Unsuccessful outbound call made today to assist with:   Eye drs  Outreach Attempt:  1st Attempt Lcsw advised sending eye drs via mychart I did that however there was no way to attach outside documents so cut and pasted Dr list to her message box   [place], Care Management  [phone] [address] , [place] [address] Email : [email or web address]

## 2021-10-29 NOTE — Telephone Encounter (Signed)
° °  Telephone encounter was:  Successful.  10/29/2021 Name: Debbie Santana MRN: [id number] DOB: 1991/05/21  Debbie Santana is a 31 y.o. year old female who is a primary care patient of Leslie Andrea, MD . The community resource team was consulted for assistance with     Care guide performed the following interventions: Patient provided with information about care guide support team and interviewed to confirm resource needs Follow up call placed to community resources to determine status of patients referral.  Follow Up Plan:  No further follow up planned at this time. The patient has been provided with needed resources.  [place], Care Management  [phone] [address] , [place] [address] Email : [email or web address]

## 2021-11-01 ENCOUNTER — Other Ambulatory Visit: Payer: Self-pay

## 2021-11-01 ENCOUNTER — Encounter (HOSPITAL_COMMUNITY): Payer: Self-pay | Admitting: *Deleted

## 2021-11-01 ENCOUNTER — Emergency Department (HOSPITAL_COMMUNITY)
Admission: EM | Admit: 2021-11-01 | Discharge: 2021-11-01 | Disposition: A | Discharge status: Home / Self Care | Payer: Medicaid Other | Attending: Emergency Medicine | Admitting: Emergency Medicine

## 2021-11-01 DIAGNOSIS — Z79899 Other long term (current) drug therapy: Secondary | ICD-10-CM | POA: Insufficient documentation

## 2021-11-01 DIAGNOSIS — S61411A Laceration without foreign body of right hand, initial encounter: Secondary | ICD-10-CM | POA: Insufficient documentation

## 2021-11-01 DIAGNOSIS — M549 Dorsalgia, unspecified: Secondary | ICD-10-CM | POA: Diagnosis not present

## 2021-11-01 DIAGNOSIS — S6991XA Unspecified injury of right wrist, hand and finger(s), initial encounter: Secondary | ICD-10-CM | POA: Diagnosis present

## 2021-11-01 DIAGNOSIS — G8929 Other chronic pain: Secondary | ICD-10-CM | POA: Diagnosis not present

## 2021-11-01 DIAGNOSIS — W268XXA Contact with other sharp object(s), not elsewhere classified, initial encounter: Secondary | ICD-10-CM | POA: Diagnosis not present

## 2021-11-01 DIAGNOSIS — Z23 Encounter for immunization: Secondary | ICD-10-CM | POA: Insufficient documentation

## 2021-11-01 MED ORDER — ACETAMINOPHEN 500 MG PO TABS
1000.0000 mg | ORAL_TABLET | Freq: Once | ORAL | Status: DC
  Filled 2021-11-01: qty 2

## 2021-11-01 MED ORDER — TETANUS-DIPHTH-ACELL PERTUSSIS 5-2.5-18.5 LF-MCG/0.5 IM SUSY
0.5000 mL | PREFILLED_SYRINGE | Freq: Once | INTRAMUSCULAR | Status: AC
  Administered 2021-11-01: 0.5 mL via INTRAMUSCULAR
  Filled 2021-11-01: qty 0.5

## 2021-11-01 MED ORDER — LIDOCAINE HCL (PF) 1 % IJ SOLN
10.0000 mL | Freq: Once | INTRAMUSCULAR | Status: AC
  Administered 2021-11-01: 10 mL
  Filled 2021-11-01: qty 30

## 2021-11-01 NOTE — ED Provider Notes (Addendum)
[facility] EMERGENCY DEPARTMENT Provider Note   CSN: [id number] Arrival date & time: 11/01/21  2101     History  Chief Complaint  Patient presents with   Laceration    Debbie Santana is a 31 y.o. female with past medical history of anxiety, erosive gastritis, peptic ulcer disease, sciatica.  Presents emergency department with a chief complaint of laceration to dorsum of right hand.  Patient states that approximately an hour before arriving in the emergency department she was pushing down on the trash when she actually cut herself on a metal can.  Patient reports hemorrhage was controlled with direct pressure.  Patient complains of pain associated with laceration.  Denies any numbness, weakness, color change.  Patient is right-hand dominant.  Patient is unsure when her last tetanus shot was.  Patient also reports that she suffered a fall earlier today.  States that she was standing on her bed when she lost her balance and landed against a pack and play crib.  Patient denies hitting her head or any loss of consciousness.  Patient reports that she does have numbness to her right leg however this is present due to chronic back pain and unchanged since the fall.  Patient complains of pain to the right lumbar area of her back.  Denies any neck pain, weakness, saddle anesthesia, bowel or bladder dysfunction, visual disturbance.  Patient is not on any blood thinners.  Laceration Associated symptoms: no fever and no rash       Home Medications Prior to Admission medications   Medication Sig Start Date End Date Taking? Authorizing Provider  albuterol (VENTOLIN HFA) 108 (90 Base) MCG/ACT inhaler Inhale 2 puffs into the lungs every 6 (six) hours as needed for wheezing or shortness of breath. 09/11/20   Orson Eva, MD  amoxicillin (AMOXIL) 500 MG capsule Take 1 capsule (500 mg total) by mouth 3 (three) times daily. 08/19/21   Evalee Jefferson, PA-C  busPIRone (BUSPAR) 15 MG tablet Take 1 tablet (15 mg  total) by mouth 3 (three) times daily. Patient not taking: Reported on 07/25/2021 08/10/20   Estill Dooms, NP  butalbital-acetaminophen-caffeine (FIORICET) [phone] MG tablet TAKE 1 TABLET BY MOUTH EVERY 6 HOURS AS NEEDED FOR MIGRAINE HEADACHE 12/04/20   Estill Dooms, NP  celecoxib (CELEBREX) 200 MG capsule Take 1 capsule (200 mg total) by mouth 2 (two) times daily. 09/06/21   Margarita Mail, PA-C  chlorhexidine (PERIDEX) 0.12 % solution Use as directed 15 mLs in the mouth or throat 2 (two) times daily. Patient not taking: Reported on 07/25/2021 07/03/21   Orpah Greek, MD  clindamycin (CLEOCIN) 300 MG capsule Take 300 mg by mouth 3 (three) times daily. 09/04/21   [provider]  clonazePAM (KLONOPIN) 1 MG tablet Take 1 mg by mouth 3 (three) times daily.    [provider]  escitalopram (LEXAPRO) 20 MG tablet Take 30 mg by mouth daily.    [provider]  etonogestrel (NEXPLANON) 68 MG IMPL implant 68 mg by Subdermal route once.    [provider]  famotidine (PEPCID) 20 MG tablet Take 20 mg by mouth 2 (two) times daily. 06/04/21   [provider]  HYDROcodone-acetaminophen (NORCO/VICODIN) 5-325 MG tablet Take 1 tablet by mouth every 6 (six) hours as needed. Patient taking differently: Take 1 tablet by mouth every 6 (six) hours as needed. Taking 10 mg, not 5 08/19/21   Idol, Almyra Free, PA-C  lidocaine (XYLOCAINE) 2 % solution Use as directed 15 mLs  in the mouth or throat as needed for mouth pain. Patient not taking: Reported on 07/25/2021 07/03/21   Orpah Greek, MD  Menthol, Topical Analgesic, (ICY HOT EX) Apply 1 application topically daily as needed (pain). Patient not taking: Reported on 07/25/2021    [provider]  omeprazole (PRILOSEC) 40 MG capsule Take 1 capsule (40 mg total) by mouth 2 (two) times daily. Patient not taking: Reported on 09/05/2021 11/21/20   Montez Morita, Quillian Quince, MD  ondansetron [facility])  8 MG tablet SMARTSIG:1 Tablet(s) By Mouth 1-3 Times Daily 08/21/21   [provider]  PARoxetine (PAXIL) 40 MG tablet TAKE 1 TABLET BY MOUTH ONCE DAILY IN THE MORNING Patient not taking: Reported on 07/25/2021 03/05/21   Estill Dooms, NP  pregabalin (LYRICA) 50 MG capsule Take 1 capsule (50 mg total) by mouth at bedtime for 10 days, THEN 1 capsule (50 mg total) 2 (two) times daily. Patient taking differently: Take 50 mg by mouth at bedtime. 08/23/20 10/22/20  Gillis Santa, MD  promethazine (PHENERGAN) 25 MG tablet Take 1 tablet (25 mg total) by mouth every 6 (six) hours as needed for nausea or vomiting. 10/16/20   Estill Dooms, NP  sucralfate (CARAFATE) 1 g tablet Take 1 tablet (1 g total) by mouth 3 (three) times daily before meals. 03/27/21   Harvel Quale, MD  traZODone (DESYREL) 50 MG tablet Take 1 tablet (50 mg total) by mouth at bedtime. Patient not taking: Reported on 07/25/2021 09/19/20   Estill Dooms, NP  valACYclovir (VALTREX) 1000 MG tablet Take 1 tablet (1,000 mg total) by mouth 2 (two) times daily. 08/06/21   Estill Dooms, NP      Allergies    Nsaids    Review of Systems   Review of Systems  Constitutional:  Negative for chills and fever.  HENT:  Negative for facial swelling.   Eyes:  Negative for visual disturbance.  Respiratory:  Negative for shortness of breath.   Cardiovascular:  Negative for chest pain.  Gastrointestinal:  Negative for abdominal pain, nausea and vomiting.  Genitourinary:  Negative for difficulty urinating and dysuria.  Musculoskeletal:  Negative for back pain and neck pain.  Skin:  Positive for wound. Negative for color change, pallor and rash.  Neurological:  Positive for numbness (right leg; baselin). Negative for dizziness, tremors, seizures, syncope, facial asymmetry, speech difficulty, weakness, light-headedness and headaches.  Psychiatric/Behavioral:  Negative for confusion.    Physical Exam Updated Vital  Signs BP 125/85 (BP Location: Left Arm)    Pulse 90    Temp (!) 96.8 F (36 C) (Temporal)    Resp 16    Ht 5\' 9"  (1.753 m)    Wt 86.2 kg    SpO2 100%    BMI 28.06 kg/m  Physical Exam Vitals and nursing note reviewed.  Constitutional:      General: She is not in acute distress.    Appearance: She is not ill-appearing, toxic-appearing or diaphoretic.  HENT:     Head: Normocephalic.  Eyes:     General: No scleral icterus.       Right eye: No discharge.        Left eye: No discharge.  Cardiovascular:     Rate and Rhythm: Normal rate.  Pulmonary:     Effort: Pulmonary effort is normal.  Musculoskeletal:     Right wrist: Normal.     Left wrist: Normal.     Right hand: Laceration present. No swelling, deformity, tenderness  or bony tenderness. Normal range of motion. Normal sensation. Normal capillary refill.     Left hand: No swelling, deformity, lacerations, tenderness or bony tenderness. Normal range of motion. Normal sensation. Normal capillary refill.     Cervical back: Normal.     Thoracic back: No swelling, edema, deformity, signs of trauma, lacerations, spasms, tenderness or bony tenderness.     Lumbar back: No swelling, edema, deformity, signs of trauma, lacerations, spasms, tenderness or bony tenderness.     Comments: No midline tenderness or deformity to cervical, thoracic, or lumbar spine.  Tenderness to right lumbar back.  No obvious injury.  0.8 cm laceration to dorsum of right hand adjacent to second MCP joint.  Cap refill less than 2 seconds in all digits of right hand.  Sensation intact to all aspects of all digits of right hand.  Patient has full range of motion to all digits of right hand.  No foreign bodies noted in wound.  Skin:    General: Skin is warm and dry.  Neurological:     General: No focal deficit present.     Mental Status: She is alert.  Psychiatric:        Behavior: Behavior is cooperative.      ED Results / Procedures / Treatments   Labs (all labs  ordered are listed, but only abnormal results are displayed) Labs Reviewed - No data to display  EKG None  Radiology No results found.  Procedures .Marland KitchenLaceration Repair  Date/Time: 11/02/2021 12:00 AM Performed by: Loni Beckwith, PA-C Authorized by: Loni Beckwith, PA-C   Consent:    Consent obtained:  Verbal   Consent given by:  Patient   Risks discussed:  Infection, need for additional repair, pain, poor cosmetic result and poor wound healing   Alternatives discussed:  No treatment and delayed treatment Universal protocol:    Procedure explained and questions answered to patient or proxy's satisfaction: yes     Relevant documents present and verified: yes     Test results available: yes     Imaging studies available: yes     Required blood products, implants, devices, and special equipment available: yes     Site/side marked: yes     Immediately prior to procedure, a time out was called: yes     Patient identity confirmed:  Verbally with patient Anesthesia:    Anesthesia method:  Local infiltration   Local anesthetic:  Lidocaine 1% w/o epi Laceration details:    Location:  Hand   Hand location:  R hand, dorsum   Length (cm):  0.8   Depth (mm):  3 Pre-procedure details:    Preparation:  Patient was prepped and draped in usual sterile fashion Exploration:    Wound exploration: wound explored through full range of motion and entire depth of wound visualized     Wound extent: no foreign bodies/material noted   Treatment:    Area cleansed with:  Povidone-iodine   Amount of cleaning:  Extensive   Irrigation volume:  1078mL Skin repair:    Repair method:  Sutures   Suture size:  3-0   Suture material:  Prolene   Suture technique:  Simple interrupted   Number of sutures:  3 Approximation:    Approximation:  Close Repair type:    Repair type:  Simple Post-procedure details:    Dressing:  Non-adherent dressing and sterile dressing   Procedure completion:   Tolerated well, no immediate complications    Medications Ordered in ED  Medications - No data to display  ED Course/ Medical Decision Making/ A&P                           Medical Decision Making Risk OTC drugs. Prescription drug management.   Alert 31 year old female no acute distress, nontoxic-appearing presents emergency department with a chief complaint of laceration to dorsum of right hand.  Information was obtained from patient.  Previous medical records were reviewed including past provider notes.  See HPI for past medical history that complicates care.  Laceration as noted above.  Entire depth of wound was viewed in a clear and bloodless field.  No foreign bodies appreciated.  X-ray imaging was considered however no foreign bodies and low suspicion for acute osseous abnormality at this time.  Procedure as noted above.  Patient have stitches removed in 10 to 14 days.    Patient also reports that she is having back pain after suffering a fall.  Patient was offered CT imaging due to reports of back surgery and chronic back pain.  Patient defers any imaging at this time.  Patient was ordered Tylenol for pain management.  Patient declines this medication.  Tetanus shot was updated.  Hemodynamically stable at this time.  Patient to follow-up with primary care provider for suture removal.  Discussed results, findings, treatment and follow up. Patient advised of return precautions. Patient verbalized understanding and agreed with plan.         Final Clinical Impression(s) / ED Diagnoses Final diagnoses:  Laceration of right hand without foreign body, initial encounter    Rx / DC Orders ED Discharge Orders     None         Loni Beckwith, PA-C 11/01/21 2359    Loni Beckwith, PA-C 11/02/21 Dyann Kief    Noemi Chapel, MD 11/02/21 [phone]

## 2021-11-01 NOTE — Discharge Instructions (Signed)
Your laceration required stiches.  Please follow up with your primary care provider, urgent care or return to the emergency department in 10-14 days to have your stitches removed.    Please keep the wound dry for the next 24 hours.  After that you may gently wash the area with soap and water.  Do not submerge the wound under water until the stiches are removed.    Get help right away if: You develop severe swelling around the wound. Your pain suddenly increases and is severe. You develop painful lumps near the wound or on skin anywhere else on your body. You have a red streak going away from your wound. The wound is on your hand or foot, and you cannot properly move a finger or toe. The wound is on your hand or foot, and you notice that your fingers or toes look pale or bluish.

## 2021-11-01 NOTE — ED Triage Notes (Addendum)
Pt with lac to back of right hand, cut from an opened can.  Pt also with back pain as well. Pt states she was standing on her bed to clean her ceiling fan and fell onto the toddler bed bedside her and then to fall. Pt was able to ambulate from waiting room to triage to assigned room .

## 2021-11-05 ENCOUNTER — Other Ambulatory Visit: Payer: Self-pay

## 2021-11-05 ENCOUNTER — Other Ambulatory Visit: Payer: Self-pay | Admitting: Obstetrics and Gynecology

## 2021-11-05 NOTE — Patient Outreach (Signed)
Care Coordination  11/05/2021  Debbie Santana 10/02/1990 [id number]  RNCM called patient at scheduled time.  Patient's Father answered phone and stated patient was at an appointment for blood work.  RNCM rescheduled appointment.  Aida Raider RN, BSN [place]   [facility] - Managed Medicaid High Risk [phone].

## 2021-11-06 ENCOUNTER — Ambulatory Visit (INDEPENDENT_AMBULATORY_CARE_PROVIDER_SITE_OTHER): Payer: Medicaid Other | Admitting: Gastroenterology

## 2021-11-06 ENCOUNTER — Other Ambulatory Visit: Payer: Self-pay

## 2021-11-06 ENCOUNTER — Other Ambulatory Visit (INDEPENDENT_AMBULATORY_CARE_PROVIDER_SITE_OTHER): Payer: Self-pay

## 2021-11-06 ENCOUNTER — Encounter (INDEPENDENT_AMBULATORY_CARE_PROVIDER_SITE_OTHER): Payer: Self-pay | Admitting: Gastroenterology

## 2021-11-06 ENCOUNTER — Encounter (INDEPENDENT_AMBULATORY_CARE_PROVIDER_SITE_OTHER): Payer: Self-pay

## 2021-11-06 VITALS — BP 120/81 | HR 79 | Ht 69.0 in | Wt 202.8 lb

## 2021-11-06 DIAGNOSIS — K625 Hemorrhage of anus and rectum: Secondary | ICD-10-CM | POA: Insufficient documentation

## 2021-11-06 DIAGNOSIS — K5903 Drug induced constipation: Secondary | ICD-10-CM | POA: Diagnosis not present

## 2021-11-06 DIAGNOSIS — R1013 Epigastric pain: Secondary | ICD-10-CM | POA: Diagnosis not present

## 2021-11-06 DIAGNOSIS — K5900 Constipation, unspecified: Secondary | ICD-10-CM | POA: Insufficient documentation

## 2021-11-06 DIAGNOSIS — Z8711 Personal history of peptic ulcer disease: Secondary | ICD-10-CM

## 2021-11-06 MED ORDER — SUCRALFATE 1 G PO TABS: 1.0000 g | ORAL_TABLET | Freq: Three times a day (TID) | ORAL | 3 refills | Status: DC

## 2021-11-06 MED ORDER — OMEPRAZOLE 40 MG PO CPDR: 40.0000 mg | DELAYED_RELEASE_CAPSULE | Freq: Two times a day (BID) | ORAL | 3 refills | Status: DC

## 2021-11-06 NOTE — Progress Notes (Signed)
Referring Provider: Leslie Andrea, MD Primary Care Physician:  Leslie Andrea, MD Primary GI Physician: Jenetta Downer Chief Complaint  Patient presents with   Abdominal Pain    Patient here today with complaints of lower abdominal pain. She states she has a history of ulcers. Symptoms on going for a few months now. She takes Omeprazole 40 mg bid and taking carafate 2-3 times per day.Has some constipation and has seen bright red blood in stools.   HPI:   Debbie Santana is a 31 y.o. female with past medical history of anxiety, depression, GERD, PUD, erosive esophagitis.  Patient presenting today for epigastric pain, constipation and rectal bleeding  Patient was last seen in February 2022 with continued epigastric pain, maintained on carafate with some symptomatic relief, though having heartburn daily, despite protonix BID. She also reported nausea almost daily as well as weight loss. Diarrhea occurring frequently after eating. Patient was advised to have repeat EGD at that ttime, continue PPI BID, take FDGard for abdominal pain, levsin Q6H if FDGard not providing relief and imodium as needed up to 4 tablets per day.   EGD done March 2022  with findings of LA Grade A reflux esophagitis with no bleeding. Advised to start omeprazole 40mg  BID x3 months.   Today, she states that she has "attacks" of pain in her mid upper abdomen, she reports this has been ongoing for the past few months. Pain is worse if she does not eat. Pain improves if she does eat. She denies any symptoms of heartburn or acid reflux. Denies vomiting but does endorse some nausea at times when pain is worse. She is not taking any NSAIDs currently, she did take one aspirin for recent dental surgery that caused worsening pain in her abdomen so she did not take anymore. She is continued on omeprazole 40 mg BID and the carafate 1g 2-3x/day, she feels that these help her upper abdominal pain some.   She is having constipation, she  states that stools are very hard for the past few weeks. She is not taking anything for her constipation. She is on Vicodin Q6H maybe 2 months for issues with her back. She is having a BM maybe 2-3x per week. She has to strain a lot to have a BM. She denies any diarrhea other than when she took a laxative at one point, though has not tried anything else for constipation. She does have some occasional central lower abdominal pain, sometimes sharp in nature, does not feel that it improves much with a BM because she does not feel that she is emptying her bowels as efficiently as she needs to. She reports some previous BRBPR, last episode was maybe 3 weeks ago. She is unsure if she was having to strain to have a BM at that time. She states that blood was mixed in with stool and when she wiped, one episode of rectal bleeding occurred previously when she passed gas. She denies any melena.  Patient denies melena, vomiting, diarrhea, dysphagia, odyonophagia, early satiety, appetite changes or weight loss.   Last EGD: March 2022- LA Grade A reflux esophagitis with no bleeding. - Normal stomach. - Normal examined duodenum. - No specimens collected.  August 2021 found to have erosive esophagitis with some gastric erosions, biopsies were negative for H. pylori.  Repeat EGD in 09/09/2020 - Moderately severe reflux esophagitis with no bleeding. - Gastritis. Biopsied. - Normal duodenal bulb, first portion of the duodenum and second portion of the Duodenum.  normal SB  biopsies, gastric biopsies neg for HP. Last Colonoscopy: 08/29/2020  - normal TI, normal colon neg for microscopic colitis, 2 mm polyp in sigmoid (lymphoid aggregate) and 8 mm polyp in rectum (condyloma accuminatum).  Past Medical History:  Diagnosis Date   Anxiety    Bradycardia 04/17/2017   Chest pain on breathing 02/22/2020   Chronic back pain    Depression    Erosive gastritis 04/11/2017   Gastric ulcer    Genital warts    History of  gestational hypertension 05/21/2018   Dx intrapartum   History of kidney stones    Intertrigo 11/16/2019   Intractable abdominal pain 04/11/2017   Intractable vomiting 05/20/2020   Irregular intermenstrual bleeding 02/19/2019   Mental disorder    Migraine without aura and without status migrainosus, not intractable 02/19/2019   Nausea 05/10/2020   Nausea & vomiting 12/07/2018   Nexplanon in place 02/19/2019   Obesity 04/17/2017   Peptic ulcer disease 04/13/2017   Pinched nerve 09/24/2019   PONV (postoperative nausea and vomiting)    Postpartum hypertension 06/23/2018   requiring norvasc, resolved at pp visit   Sciatica    Vaginal Pap smear, abnormal     Past Surgical History:  Procedure Laterality Date   BACK SURGERY     ruptured disc.   BIOPSY  04/14/2017   Procedure: BIOPSY;  Surgeon: Rogene Houston, MD;  [facility];  Service: Endoscopy;;  gastric   BIOPSY  05/22/2020   Procedure: BIOPSY;  Surgeon: Daneil Dolin, MD;  [facility];  Service: Endoscopy;;   BIOPSY  08/29/2020   Procedure: BIOPSY;  Surgeon: Harvel Quale, MD;  [facility];  Service: Gastroenterology;;   BIOPSY  09/09/2020   Procedure: BIOPSY;  Surgeon: Eloise Harman, DO;  [facility];  Service: Endoscopy;;  duodenum;antral   CERVICAL ABLATION N/A 12/02/2018   Procedure: LASER ABLATION OF CERVIX;  Surgeon: Florian Buff, MD;  Location: [facility];  Service: Gynecology;  Laterality: N/A;   CHOLECYSTECTOMY N/A 04/17/2017   Procedure: LAPAROSCOPIC CHOLECYSTECTOMY;  Surgeon: Aviva Signs, MD;  Location: [facility];  Service: General;  Laterality: N/A;   COLONOSCOPY WITH PROPOFOL N/A 08/29/2020   Procedure: COLONOSCOPY WITH PROPOFOL;  Surgeon: Harvel Quale, MD;  [facility];  Service: Gastroenterology;  Laterality: N/A;  11:15   COLPOSCOPY     ESOPHAGOGASTRODUODENOSCOPY (EGD) WITH PROPOFOL N/A 04/14/2017   Procedure:  ESOPHAGOGASTRODUODENOSCOPY (EGD) WITH PROPOFOL;  Surgeon: Rogene Houston, MD;  [facility];  Service: Endoscopy;  Laterality: N/A;   ESOPHAGOGASTRODUODENOSCOPY (EGD) WITH PROPOFOL N/A 05/22/2020   Procedure: ESOPHAGOGASTRODUODENOSCOPY (EGD) WITH PROPOFOL;  Surgeon: Daneil Dolin, MD;  [facility];  Service: Endoscopy;  Laterality: N/A;   ESOPHAGOGASTRODUODENOSCOPY (EGD) WITH PROPOFOL N/A 09/09/2020   Procedure: ESOPHAGOGASTRODUODENOSCOPY (EGD) WITH PROPOFOL;  Surgeon: Eloise Harman, DO;  [facility];  Service: Endoscopy;  Laterality: N/A;   ESOPHAGOGASTRODUODENOSCOPY (EGD) WITH PROPOFOL N/A 11/21/2020   Procedure: ESOPHAGOGASTRODUODENOSCOPY (EGD) WITH PROPOFOL;  Surgeon: Harvel Quale, MD;  [facility];  Service: Gastroenterology;  Laterality: N/A;  Am   HERNIA REPAIR Bilateral    inguinal   LASER ABLATION CONDOLAMATA N/A 12/02/2018   Procedure: LASER ABLATION CONDYLOMA ACCUMINATA LEFT AND RIGHT VULVA, PERINEUM AND PERIANAL (15 TOTAL);  Surgeon: Florian Buff, MD;  Location: [facility];  Service: Gynecology;  Laterality: N/A;   POLYPECTOMY  08/29/2020   Procedure: POLYPECTOMY;  Surgeon: Montez Morita,  Quillian Quince, MD;  [facility];  Service: Gastroenterology;;    Current Outpatient Medications  Medication Sig Dispense Refill   albuterol (VENTOLIN HFA) 108 (90 Base) MCG/ACT inhaler Inhale 2 puffs into the lungs every 6 (six) hours as needed for wheezing or shortness of breath. 1 each 0   butalbital-acetaminophen-caffeine (FIORICET) 50-325-40 MG tablet TAKE 1 TABLET BY MOUTH EVERY 6 HOURS AS NEEDED FOR MIGRAINE HEADACHE 20 tablet 0   escitalopram (LEXAPRO) 20 MG tablet Take 10 mg by mouth 3 (three) times daily.     etonogestrel (NEXPLANON) 68 MG IMPL implant 68 mg by Subdermal route once.     famotidine (PEPCID) 20 MG tablet Take 20 mg by mouth 2 (two) times daily.     HYDROcodone-acetaminophen (NORCO/VICODIN) 5-325 MG tablet  Take 1 tablet by mouth every 6 (six) hours as needed. (Patient taking differently: Take 1 tablet by mouth every 6 (six) hours as needed. Taking 10 mg, not 5) 6 tablet 0   omeprazole (PRILOSEC) 40 MG capsule Take 1 capsule (40 mg total) by mouth 2 (two) times daily. 180 capsule 1   ondansetron (ZOFRAN) 8 MG tablet SMARTSIG:1 Tablet(s) By Mouth 1-3 Times Daily     sucralfate (CARAFATE) 1 g tablet Take 1 tablet (1 g total) by mouth 3 (three) times daily before meals. 90 tablet 3   No current facility-administered medications for this visit.    Allergies as of 11/06/2021 - Review Complete 11/06/2021  Allergen Reaction Noted   Nsaids Other (See Comments) 08/22/2020    Family History  Problem Relation Age of Onset   Anxiety disorder Mother    Hyperlipidemia Mother    Crohn's disease Sister    Diabetes Maternal Grandfather    Diabetes Cousin    Learning disabilities Cousin     Social History   Socioeconomic History   Marital status: Single    Spouse name: Not on file   Number of children: 1   Years of education: Not on file   Highest education level: Not on file  Occupational History   Not on file  Tobacco Use   Smoking status: Every Day    Packs/day: 1.00    Years: 5.00    Pack years: 5.00    Types: Cigarettes   Smokeless tobacco: Never  Vaping Use   Vaping Use: Never used  Substance and Sexual Activity   Alcohol use: No   Drug use: No   Sexual activity: Yes    Birth control/protection: Implant  Other Topics Concern   Not on file  Social History Narrative   Lives with son-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      3 dogs and 3 cats      Enjoy: crafts      Diet: eats all food groups -acidic foods   Caffeine: stopped in last week or so   Water: 6-8 cups daily       Wears seat belt   Does not drive   Smoke detectors at home   Data processing manager    No Weapons    Social Determinants of Health   Financial Resource Strain: Medium Risk    Difficulty of Paying Living Expenses: Somewhat hard  Food Insecurity: Landscape architect Present   Worried About Charity fundraiser in the Last Year: Sometimes true   Ran Out of Food in the Last Year: Sometimes true  Transportation Needs: No Transportation Needs   Lack of Transportation (Medical):  No   Lack of Transportation (Non-Medical): No  Physical Activity: Sufficiently Active   Days of Exercise per Week: 7 days   Minutes of Exercise per Session: 100 min  Stress: Stress Concern Present   Feeling of Stress : Rather much  Social Connections: Socially Isolated   Frequency of Communication with Friends and Family: More than three times a week   Frequency of Social Gatherings with Friends and Family: More than three times a week   Attends Religious Services: Never   Marine scientist or Organizations: No   Attends Music therapist: Never   Marital Status: Never married   Review of systems General: negative for malaise, night sweats, fever, chills, weight loss Neck: Negative for lumps, goiter, pain and significant neck swelling Resp: Negative for cough, wheezing, dyspnea at rest CV: Negative for chest pain, leg swelling, palpitations, orthopnea GI: denies melena, hematochezia, diarrhea, dysphagia, odyonophagia, early satiety or unintentional weight loss. +constipation +epigastric pain +nausea MSK: Negative for joint pain or swelling, back pain, and muscle pain. Derm: Negative for itching or rash Psych: Denies depression, anxiety, memory loss, confusion. No homicidal or suicidal ideation.  Heme: Negative for prolonged bleeding, bruising easily, and swollen nodes. Endocrine: Negative for cold or heat intolerance, polyuria, polydipsia and goiter. Neuro: negative for tremor, gait imbalance, syncope and seizures. The remainder of the review of systems is noncontributory.  Physical Exam: BP 120/81 (BP Location: Left Arm, Patient Position: Sitting, Cuff Size: Large)     Pulse 79    Ht 5\' 9"  (1.753 m)    Wt 202 lb 12.8 oz (92 kg)    BMI 29.95 kg/m  General:   Alert and oriented. No distress noted. Pleasant and cooperative.  Head:  Normocephalic and atraumatic. Eyes:  Conjuctiva clear without scleral icterus. Mouth:  Oral mucosa pink and moist. Good dentition. No lesions. Heart: Normal rate and rhythm, s1 and s2 heart sounds present.  Lungs: Clear lung sounds in all lobes. Respirations equal and unlabored. Abdomen:  +BS, soft, and non-distended. TTP of epigastric region. No rebound or guarding. No HSM or masses noted. Derm: No palmar erythema or jaundice Msk:  Symmetrical without gross deformities. Normal posture. Extremities:  Without edema. Neurologic:  Alert and  oriented x4 Psych:  Alert and cooperative. Normal mood and affect.  Invalid input(s): 6 MONTHS   ASSESSMENT: Debbie Santana is a 31 y.o. female presenting today for epigastric pain, constipation and rectal bleeding.   Patient with hx of erosive esophagitis, gastritis, and gastric ulcers previously, maintained on omeprazole 40mg  BID and carafate 1g TID, last EGD in March 2022 with non bleeding Grade A reflux esophagitis who reports epigastric pain for the past few months, improved post prandially. She denies any melena, has occasional nausea without vomiting. Weight and appetite are stable. We will proceed with EGD for further evaluation given her history. She is not taking any NSAIDs currently. It is possible pain is from known esophagitis, however, PUD cannot be ruled out. Indications, risks and benefits of procedure discussed in detail with patient. Patient verbalized understanding and is in agreement to proceed with EGD at this time.   She reports constipation for the past few months, I suspect this is secondary to regular opiate pain medication use as she is on vocodin Q6H daily. She has also noted some intermittent BRBPR, however, she does endorse having to strain quite a bit and having very  hard stools with 2-3 BMs per week. Last episode of rectal bleeding was  about 3 weeks ago. She does not want to try miralax as she states she cannot tolerate having to drink a liquid for her constipation. I will provide samples of linzess 80mcg per day to see if this helps. Ultimately, as we discussed, vicodin is likely heavily contributing to her constipation. She should drink plenty of water and make sure diet is high in fruits, veggies and whole grains. She will let me know if she has any further rectal bleeding. Colonoscopy to be considered if rectal bleeding recurs.   PLAN:  Continue Omeprazole 40mg  BID, refill sent 2. Continue Carafate 1g TID, refill sent  3. Linzess 63mcg samples given, will Rx if symptoms improved 4. Schedule EGD-endo 1 5. Patient to make me aware of further rectal bleeding, consider colonoscopy if recurrence  6. Stay well hydrated with diet high in fruits, veggies and whole grains  Follow Up: 6 months  Elson Ulbrich L. Alver Sorrow, MSN, APRN, AGNP-C Adult-Gerontology Nurse Practitioner [facility]

## 2021-11-06 NOTE — Patient Instructions (Signed)
We will get you scheduled for upper endoscopy for further evaluation of your upper abdominal pain Please continue omeprazole 40mg  twice daily and carafate 1g three times a day I am providing samples of linzess for your constipation, this is the lowest dose, please try this once daily to see if there is any improvement in constipation, we can increase the dose if this dose is not strong enough. Please be mindful sometimes these medications can cause stomach upset initially with some diarrhea and nausea, this usually subsides after a few weeks. If you like this medication I can send a prescription. As we discussed, opiate pain medications are notorious for causing constipation. Please stay well hydrated and eat a diet high in fruits, veggies and whole grains. Please let me know if you have any further rectal bleeding, I suspect this was related to your constipation and straining to have a bowel movement but if this recurs we will discuss proceeding with a colonoscopy.

## 2021-11-07 ENCOUNTER — Encounter (INDEPENDENT_AMBULATORY_CARE_PROVIDER_SITE_OTHER): Payer: Self-pay

## 2021-11-08 ENCOUNTER — Encounter: Payer: Self-pay | Admitting: Adult Health

## 2021-11-08 ENCOUNTER — Other Ambulatory Visit: Payer: Self-pay

## 2021-11-08 ENCOUNTER — Ambulatory Visit: Payer: Medicaid Other | Admitting: Adult Health

## 2021-11-08 VITALS — BP 107/62 | HR 81 | Ht 69.0 in | Wt 199.8 lb

## 2021-11-08 DIAGNOSIS — Z975 Presence of (intrauterine) contraceptive device: Secondary | ICD-10-CM | POA: Diagnosis not present

## 2021-11-08 DIAGNOSIS — N939 Abnormal uterine and vaginal bleeding, unspecified: Secondary | ICD-10-CM | POA: Diagnosis not present

## 2021-11-08 MED ORDER — MEGESTROL ACETATE 40 MG PO TABS: ORAL_TABLET | ORAL | 1 refills | Status: DC

## 2021-11-08 NOTE — Progress Notes (Signed)
°  Subjective:     Patient ID: Debbie Santana, female   DOB: [birth year], 31 y.o.   MRN: [id number]  HPI Debbie Santana is a 31 year old white female,single, G2P1011 in complaining of bleeding since 09/23/21, may be heavy or light, has nexplanon. Having back issues now, may have surgery.  Lab Results  Component Value Date   DIAGPAP (A) 09/05/2021    - Atypical squamous cells of undetermined significance (ASC-US)   HPVHIGH Negative 09/05/2021   PCP is Dr [name].  Review of Systems Bleeding since 09/23/21 with nexplanon Had back pain Reviewed past medical,surgical, social and family history. Reviewed medications and allergies.     Objective:   Physical Exam BP 107/62 (BP Location: Right Arm, Patient Position: Sitting, Cuff Size: Normal)    Pulse 81    Ht 5\' 9"  (1.753 m)    Wt 199 lb 12.8 oz (90.6 kg)    LMP 09/23/2021 (Approximate) Comment: Bleeding for 1.5 months   BMI 29.51 kg/m     Skin warm and dry.Pelvic: external genitalia is normal in appearance no lesions, vagina: +light blood,urethra has no lesions or masses noted, cervix:smooth and bulbous, uterus: normal size, shape and contour, non tender, no masses felt, adnexa: no masses or tenderness noted. Bladder is non tender and no masses felt   Upstream - 11/08/21 1132       Pregnancy Intention Screening   Does the patient want to become pregnant in the next year? No    Does the patient's partner want to become pregnant in the next year? No    Would the patient like to discuss contraceptive options today? No      Contraception Wrap Up   Current Method Hormonal Implant    End Method Hormonal Implant    Contraception Counseling Provided No             Assessment:     1. Abnormal uterine bleeding (AUB) Will rx megace to stop bleeding Meds ordered this encounter  Medications   megestrol (MEGACE) 40 MG tablet    Sig: Take 3 x 5 days, then 2 x 5 days then 1 daily til no bleeding    Dispense:  45 tablet    Refill:  1    Order  Specific Question:   Supervising Provider    Answer:   Elonda Husky, LUTHER H [[id number]]     2. Nexplanon in place Placed 09/05/21    Plan:     Follow up prn

## 2021-11-12 ENCOUNTER — Other Ambulatory Visit: Payer: Self-pay | Admitting: Obstetrics and Gynecology

## 2021-11-12 NOTE — Patient Instructions (Signed)
Hi Debbie Santana, I am sorry I could not reach you today - as a part of your Medicaid benefit, you are eligible for care management and care coordination services at no cost or copay. I was unable to reach you by phone today but would be happy to help you with your health related needs. Please feel free to call me at [phone]  A member of the Managed Medicaid care management team will reach out to you again over the next 7-14 days.   Aida Raider RN, BSN [place]   [facility] - Managed Medicaid High Risk [phone]

## 2021-11-12 NOTE — Patient Outreach (Signed)
Care Coordination  11/12/2021  Debbie Santana 1991/06/09 [id number]   Medicaid Managed Care   Unsuccessful Outreach Note  11/12/2021 Name: Debbie Santana MRN: [id number] DOB: [birth year]  Referred by: Leslie Andrea, MD Reason for referral : High Risk Managed Medicaid (Unsuccessful telephone outreach)   An unsuccessful telephone outreach was attempted today. The patient was referred to the case management team for assistance with care management and care coordination.   Follow Up Plan: The care management team will reach out to the patient again over the next 7-14 days.   Aida Raider RN, BSN [place]   [facility] - Managed Medicaid High Risk [phone]

## 2021-11-13 ENCOUNTER — Other Ambulatory Visit: Payer: Medicaid Other | Admitting: Obstetrics and Gynecology

## 2021-11-13 ENCOUNTER — Other Ambulatory Visit: Payer: Self-pay

## 2021-11-13 ENCOUNTER — Ambulatory Visit: Payer: Self-pay

## 2021-11-13 DIAGNOSIS — M5441 Lumbago with sciatica, right side: Secondary | ICD-10-CM | POA: Diagnosis not present

## 2021-11-13 DIAGNOSIS — Z6830 Body mass index (BMI) 30.0-30.9, adult: Secondary | ICD-10-CM | POA: Diagnosis not present

## 2021-11-13 DIAGNOSIS — R030 Elevated blood-pressure reading, without diagnosis of hypertension: Secondary | ICD-10-CM | POA: Diagnosis not present

## 2021-11-13 NOTE — Patient Outreach (Signed)
Care Coordination  11/13/2021  Debbie Santana 1990-12-20 [id number]  RNCM called patient for Managed Medicaid Pharmacy appointment-no answer.  Aida Raider RN, BSN [place]   [facility] - Managed Medicaid High Risk [phone].

## 2021-11-13 NOTE — Patient Instructions (Signed)
Hi Ms. Rickenbach was trying to call you today for your Pharmacy appointment and I am sorry I missed you, if you have questions please let us know  - as a part of your Medicaid benefit, you are eligible for care management and care coordination services at no cost or copay. I was unable to reach you by phone today but would be happy to help you with your health related needs. Please feel free to call me at [phone]  A member of the Managed Medicaid care management team will reach out to you again over the next 7 -14 days.   Aida Raider RN, BSN [place]   [facility] - Managed Medicaid High Risk [phone]

## 2021-11-16 DIAGNOSIS — M545 Low back pain, unspecified: Secondary | ICD-10-CM | POA: Diagnosis not present

## 2021-11-16 DIAGNOSIS — Z79891 Long term (current) use of opiate analgesic: Secondary | ICD-10-CM | POA: Diagnosis not present

## 2021-11-16 DIAGNOSIS — S61411D Laceration without foreign body of right hand, subsequent encounter: Secondary | ICD-10-CM | POA: Diagnosis not present

## 2021-11-16 DIAGNOSIS — M25551 Pain in right hip: Secondary | ICD-10-CM | POA: Diagnosis not present

## 2021-11-16 NOTE — Patient Instructions (Signed)
Debbie Santana  11/16/2021     @PREFPERIOPPHARMACY @   Your procedure is scheduled on  31/2023.   Report to [facility] at  0700 A.M.   Call this number if you have problems the morning of surgery:  [phone]   Remember:  Follow the diet instructions given to you by the office.    Use your inhaler before you come and bring your rescue inhaler with you.    Take these medicines the morning of surgery with A SIP OF WATER          fioricet(if needed), lexapro, hydrocodone, ativan, prilosec, zofran (if need).     Do not wear jewelry, make-up or nail polish.  Do not wear lotions, powders, or perfumes, or deodorant.  Do not shave 48 hours prior to surgery.  Men may shave face and neck.  Do not bring valuables to the hospital.  [facility] is not responsible for any belongings or valuables.  Contacts, dentures or bridgework may not be worn into surgery.  Leave your suitcase in the car.  After surgery it may be brought to your room.  For patients admitted to the hospital, discharge time will be determined by your treatment team.  Patients discharged the day of surgery will not be allowed to drive home and must have someone with them for 24 hours.    Special instructions:   DO NOT smoke tobacco or vape for 24 hours before your procedure.  Please read over the following fact sheets that you were given. Anesthesia Post-op Instructions and Care and Recovery After Surgery      Upper Endoscopy, Adult, Care After This sheet gives you information about how to care for yourself after your procedure. Your health care provider may also give you more specific instructions. If you have problems or questions, contact your health care provider. What can I expect after the procedure? After the procedure, it is common to have: A sore throat. Mild stomach pain or discomfort. Bloating. Nausea. Follow these instructions at home:  Follow instructions from your health care provider  about what to eat or drink after your procedure. Return to your normal activities as told by your health care provider. Ask your health care provider what activities are safe for you. Take over-the-counter and prescription medicines only as told by your health care provider. If you were given a sedative during the procedure, it can affect you for several hours. Do not drive or operate machinery until your health care provider says that it is safe. Keep all follow-up visits as told by your health care provider. This is important. Contact a health care provider if you have: A sore throat that lasts longer than one day. Trouble swallowing. Get help right away if: You vomit blood or your vomit looks like coffee grounds. You have: A fever. Bloody, black, or tarry stools. A severe sore throat or you cannot swallow. Difficulty breathing. Severe pain in your chest or abdomen. Summary After the procedure, it is common to have a sore throat, mild stomach discomfort, bloating, and nausea. If you were given a sedative during the procedure, it can affect you for several hours. Do not drive or operate machinery until your health care provider says that it is safe. Follow instructions from your health care provider about what to eat or drink after your procedure. Return to your normal activities as told by your health care provider. This information is not intended to replace advice given to  you by your health care provider. Make sure you discuss any questions you have with your health care provider. Document Revised: 07/16/2019 Document Reviewed: 02/09/2018 Elsevier Patient Education  2022 [place] After This sheet gives you information about how to care for yourself after your procedure. Your health care provider may also give you more specific instructions. If you have problems or questions, contact your health care provider. What can I expect after the  procedure? After the procedure, it is common to have: Tiredness. Forgetfulness about what happened after the procedure. Impaired judgment for important decisions. Nausea or vomiting. Some difficulty with balance. Follow these instructions at home: For the time period you were told by your health care provider:   Rest as needed. Do not participate in activities where you could fall or become injured. Do not drive or use machinery. Do not drink alcohol. Do not take sleeping pills or medicines that cause drowsiness. Do not make important decisions or sign legal documents. Do not take care of children on your own. Eating and drinking Follow the diet that is recommended by your health care provider. Drink enough fluid to keep your urine pale yellow. If you vomit: Drink water, juice, or soup when you can drink without vomiting. Make sure you have little or no nausea before eating solid foods. General instructions Have a responsible adult stay with you for the time you are told. It is important to have someone help care for you until you are awake and alert. Take over-the-counter and prescription medicines only as told by your health care provider. If you have sleep apnea, surgery and certain medicines can increase your risk for breathing problems. Follow instructions from your health care provider about wearing your sleep device: Anytime you are sleeping, including during daytime naps. While taking prescription pain medicines, sleeping medicines, or medicines that make you drowsy. Avoid smoking. Keep all follow-up visits as told by your health care provider. This is important. Contact a health care provider if: You keep feeling nauseous or you keep vomiting. You feel light-headed. You are still sleepy or having trouble with balance after 24 hours. You develop a rash. You have a fever. You have redness or swelling around the IV site. Get help right away if: You have trouble  breathing. You have new-onset confusion at home. Summary For several hours after your procedure, you may feel tired. You may also be forgetful and have poor judgment. Have a responsible adult stay with you for the time you are told. It is important to have someone help care for you until you are awake and alert. Rest as told. Do not drive or operate machinery. Do not drink alcohol or take sleeping pills. Get help right away if you have trouble breathing, or if you suddenly become confused. This information is not intended to replace advice given to you by your health care provider. Make sure you discuss any questions you have with your health care provider. Document Revised: 05/25/2020 Document Reviewed: 08/12/2019 Elsevier Patient Education  2022 [employer].

## 2021-11-19 ENCOUNTER — Encounter (HOSPITAL_COMMUNITY)
Admission: RE | Admit: 2021-11-19 | Discharge: 2021-11-19 | Disposition: A | Discharge status: Home / Self Care | Payer: Medicaid Other | Source: Ambulatory Visit | Attending: Internal Medicine | Admitting: Internal Medicine

## 2021-11-19 DIAGNOSIS — Z01812 Encounter for preprocedural laboratory examination: Secondary | ICD-10-CM | POA: Insufficient documentation

## 2021-11-19 DIAGNOSIS — M5136 Other intervertebral disc degeneration, lumbar region: Secondary | ICD-10-CM | POA: Diagnosis not present

## 2021-11-19 DIAGNOSIS — M5441 Lumbago with sciatica, right side: Secondary | ICD-10-CM | POA: Diagnosis not present

## 2021-11-19 DIAGNOSIS — R1013 Epigastric pain: Secondary | ICD-10-CM | POA: Diagnosis not present

## 2021-11-19 DIAGNOSIS — M5451 Vertebrogenic low back pain: Secondary | ICD-10-CM | POA: Diagnosis not present

## 2021-11-19 DIAGNOSIS — Z8711 Personal history of peptic ulcer disease: Secondary | ICD-10-CM

## 2021-11-19 LAB — PREGNANCY, URINE: Preg Test, Ur: NEGATIVE

## 2021-11-21 ENCOUNTER — Ambulatory Visit (HOSPITAL_COMMUNITY): Payer: Medicaid Other | Admitting: Anesthesiology

## 2021-11-21 ENCOUNTER — Ambulatory Visit (HOSPITAL_COMMUNITY)
Admission: RE | Admit: 2021-11-21 | Discharge: 2021-11-21 | Disposition: A | Discharge status: Home / Self Care | Payer: Medicaid Other | Attending: Internal Medicine | Admitting: Internal Medicine

## 2021-11-21 ENCOUNTER — Ambulatory Visit (HOSPITAL_BASED_OUTPATIENT_CLINIC_OR_DEPARTMENT_OTHER): Payer: Medicaid Other | Admitting: Anesthesiology

## 2021-11-21 ENCOUNTER — Encounter (HOSPITAL_COMMUNITY)
Admission: RE | Disposition: A | Discharge status: Home / Self Care | Payer: Self-pay | Source: Home / Self Care | Attending: Internal Medicine

## 2021-11-21 DIAGNOSIS — Z9049 Acquired absence of other specified parts of digestive tract: Secondary | ICD-10-CM | POA: Diagnosis not present

## 2021-11-21 DIAGNOSIS — K2289 Other specified disease of esophagus: Secondary | ICD-10-CM

## 2021-11-21 DIAGNOSIS — K3189 Other diseases of stomach and duodenum: Secondary | ICD-10-CM | POA: Diagnosis not present

## 2021-11-21 DIAGNOSIS — F32A Depression, unspecified: Secondary | ICD-10-CM | POA: Insufficient documentation

## 2021-11-21 DIAGNOSIS — F419 Anxiety disorder, unspecified: Secondary | ICD-10-CM | POA: Insufficient documentation

## 2021-11-21 DIAGNOSIS — F17210 Nicotine dependence, cigarettes, uncomplicated: Secondary | ICD-10-CM | POA: Diagnosis not present

## 2021-11-21 DIAGNOSIS — Z79899 Other long term (current) drug therapy: Secondary | ICD-10-CM | POA: Diagnosis not present

## 2021-11-21 DIAGNOSIS — K259 Gastric ulcer, unspecified as acute or chronic, without hemorrhage or perforation: Secondary | ICD-10-CM | POA: Diagnosis not present

## 2021-11-21 DIAGNOSIS — R1013 Epigastric pain: Secondary | ICD-10-CM | POA: Insufficient documentation

## 2021-11-21 DIAGNOSIS — Z8711 Personal history of peptic ulcer disease: Secondary | ICD-10-CM

## 2021-11-21 DIAGNOSIS — K319 Disease of stomach and duodenum, unspecified: Secondary | ICD-10-CM | POA: Diagnosis not present

## 2021-11-21 DIAGNOSIS — K219 Gastro-esophageal reflux disease without esophagitis: Secondary | ICD-10-CM | POA: Diagnosis not present

## 2021-11-21 DIAGNOSIS — I10 Essential (primary) hypertension: Secondary | ICD-10-CM | POA: Insufficient documentation

## 2021-11-21 HISTORY — PX: BACK SURGERY: SHX140

## 2021-11-21 HISTORY — PX: ESOPHAGOGASTRODUODENOSCOPY (EGD) WITH PROPOFOL: SHX5813

## 2021-11-21 HISTORY — PX: BIOPSY: SHX5522

## 2021-11-21 SURGERY — ESOPHAGOGASTRODUODENOSCOPY (EGD) WITH PROPOFOL
Anesthesia: General

## 2021-11-21 MED ORDER — LACTATED RINGERS IV SOLN: INTRAVENOUS | Status: DC

## 2021-11-21 MED ORDER — PROPOFOL 10 MG/ML IV BOLUS
INTRAVENOUS | Status: DC | PRN
  Administered 2021-11-21 (×2): 50 mg via INTRAVENOUS
  Administered 2021-11-21: 200 mg via INTRAVENOUS

## 2021-11-21 MED ORDER — FENTANYL CITRATE (PF) 100 MCG/2ML IJ SOLN
50.0000 ug | Freq: Once | INTRAMUSCULAR | Status: AC
  Administered 2021-11-21: 50 ug via INTRAVENOUS
  Filled 2021-11-21: qty 2

## 2021-11-21 MED ORDER — LIDOCAINE HCL (CARDIAC) PF 50 MG/5ML IV SOSY
PREFILLED_SYRINGE | INTRAVENOUS | Status: DC | PRN
  Administered 2021-11-21: 50 mg via INTRAVENOUS

## 2021-11-21 MED ORDER — HYOSCYAMINE SULFATE 0.125 MG PO TABS: 0.1250 mg | ORAL_TABLET | Freq: Four times a day (QID) | ORAL | 1 refills | Status: DC | PRN

## 2021-11-21 NOTE — Anesthesia Postprocedure Evaluation (Signed)
Anesthesia Post Note ? ?Patient: TYKERRIA MCCUBBINS ? ?Procedure(s) Performed: ESOPHAGOGASTRODUODENOSCOPY (EGD) WITH PROPOFOL ?BIOPSY ? ?Patient location during evaluation: Phase II ?Anesthesia Type: General ?Level of consciousness: awake and alert and oriented ?Pain management: pain level controlled ?Vital Signs Assessment: post-procedure vital signs reviewed and stable ?Respiratory status: spontaneous breathing, nonlabored ventilation and respiratory function stable ?Cardiovascular status: blood pressure returned to baseline and stable ?Postop Assessment: no apparent nausea or vomiting ?Anesthetic complications: no ? ? ?No notable events documented. ? ? ?Last Vitals:  ?Vitals:  ? 11/21/21 0755 11/21/21 0833  ?BP: (P) 113/77 106/70  ?Pulse: (!) 56 (!) 58  ?Resp: 16 (!) 21  ?Temp:  36.6 ?C  ?SpO2: 98% 100%  ?  ?Last Pain:  ?Vitals:  ? 11/21/21 0833  ?TempSrc: Oral  ?PainSc: 4   ? ? ?  ?  ?  ?  ?  ?  ? ?Colinda Barth C Jacquelene Kopecky ? ? ? ? ?

## 2021-11-21 NOTE — Transfer of Care (Signed)
Immediate Anesthesia Transfer of Care Note ? ?Patient: Debbie Santana ? ?Procedure(s) Performed: ESOPHAGOGASTRODUODENOSCOPY (EGD) WITH PROPOFOL ?BIOPSY ? ?Patient Location: Short Stay ? ?Anesthesia Type:General ? ?Level of Consciousness: sedated ? ?Airway & Oxygen Therapy: Patient Spontanous Breathing and Patient connected to nasal cannula oxygen ? ?Post-op Assessment: Report given to RN and Post -op Vital signs reviewed and stable ? ?Post vital signs: Reviewed and stable ? ?Last Vitals:  ?Vitals Value Taken Time  ?BP 94/54 0835  ?Temp 97.9 0835  ?Pulse 59 0835  ?Resp    ?SpO2 96 0835  ? ? ?Last Pain:  ?Vitals:  ? 11/21/21 0814  ?TempSrc:   ?PainSc: 5   ?   ? ?Patients Stated Pain Goal: 9 (11/21/21 0715) ? ?Complications: No notable events documented. ?

## 2021-11-21 NOTE — Discharge Instructions (Addendum)
No aspirin for 24 hours ?Resume usual medications and diet as before. ?Hyoscyamine sublingual 1 tablet up to 4 times a day as needed. ?No driving for 24 hours. ?Physician or office will call with biopsy results and further recommendations. ?

## 2021-11-21 NOTE — H&P (Signed)
Debbie Santana is an 31 y.o. female.   Chief Complaint: Patient is here for esophagogastroduodenoscopy. HPI: Patient is a 31 year old Caucasian female with multiple medical problems including history of erosive reflux esophagitis was been maintained on double dose PPI who presents with few month history of epigastric pain and nausea anorexia and weight loss.  She states she has lost 70 pounds in more than a year.  She is drinking protein shakes which helps pain.  She states when she is fasting pain gets worse.  Pain radiates posteriorly.  She has had gallbladder removed before.  She has a history of peptic ulcer disease.  She states her symptoms got worse when she took single dose of aspirin for back pain.  She is scheduled for MRI back later this week.  She denies melena or frank rectal bleeding.  She says heartburn is well controlled with therapy.  Past Medical History:  Diagnosis Date   Anxiety    Bradycardia 04/17/2017   Chest pain on breathing 02/22/2020   Chronic back pain    Depression    Erosive gastritis 04/11/2017   Gastric ulcer    Genital warts    History of gestational hypertension 05/21/2018   Dx intrapartum   History of kidney stones    Intertrigo 11/16/2019   Intractable abdominal pain 04/11/2017   Intractable vomiting 05/20/2020   Irregular intermenstrual bleeding 02/19/2019   Mental disorder    Migraine without aura and without status migrainosus, not intractable 02/19/2019   Nausea 05/10/2020   Nausea & vomiting 12/07/2018   Nexplanon in place 02/19/2019   Obesity 04/17/2017   Peptic ulcer disease 04/13/2017   Pinched nerve 09/24/2019   PONV (postoperative nausea and vomiting)    Postpartum hypertension 06/23/2018   requiring norvasc, resolved at pp visit   Sciatica    Vaginal Pap smear, abnormal     Past Surgical History:  Procedure Laterality Date   BACK SURGERY     ruptured disc.   BIOPSY  04/14/2017   Procedure: BIOPSY;  Surgeon: Rogene Houston, MD;   [facility];  Service: Endoscopy;;  gastric   BIOPSY  05/22/2020   Procedure: BIOPSY;  Surgeon: Daneil Dolin, MD;  [facility];  Service: Endoscopy;;   BIOPSY  08/29/2020   Procedure: BIOPSY;  Surgeon: Harvel Quale, MD;  [facility];  Service: Gastroenterology;;   BIOPSY  09/09/2020   Procedure: BIOPSY;  Surgeon: Eloise Harman, DO;  [facility];  Service: Endoscopy;;  duodenum;antral   CERVICAL ABLATION N/A 12/02/2018   Procedure: LASER ABLATION OF CERVIX;  Surgeon: Florian Buff, MD;  Location: [facility];  Service: Gynecology;  Laterality: N/A;   CHOLECYSTECTOMY N/A 04/17/2017   Procedure: LAPAROSCOPIC CHOLECYSTECTOMY;  Surgeon: Aviva Signs, MD;  Location: [facility];  Service: General;  Laterality: N/A;   COLONOSCOPY WITH PROPOFOL N/A 08/29/2020   Procedure: COLONOSCOPY WITH PROPOFOL;  Surgeon: Harvel Quale, MD;  [facility];  Service: Gastroenterology;  Laterality: N/A;  11:15   COLPOSCOPY     ESOPHAGOGASTRODUODENOSCOPY (EGD) WITH PROPOFOL N/A 04/14/2017   Procedure: ESOPHAGOGASTRODUODENOSCOPY (EGD) WITH PROPOFOL;  Surgeon: Rogene Houston, MD;  [facility];  Service: Endoscopy;  Laterality: N/A;   ESOPHAGOGASTRODUODENOSCOPY (EGD) WITH PROPOFOL N/A 05/22/2020   Procedure: ESOPHAGOGASTRODUODENOSCOPY (EGD) WITH PROPOFOL;  Surgeon: Daneil Dolin, MD;  [facility];  Service: Endoscopy;  Laterality: N/A;   ESOPHAGOGASTRODUODENOSCOPY (EGD) WITH PROPOFOL N/A 09/09/2020  Procedure: ESOPHAGOGASTRODUODENOSCOPY (EGD) WITH PROPOFOL;  Surgeon: Eloise Harman, DO;  [facility];  Service: Endoscopy;  Laterality: N/A;   ESOPHAGOGASTRODUODENOSCOPY (EGD) WITH PROPOFOL N/A 11/21/2020   Procedure: ESOPHAGOGASTRODUODENOSCOPY (EGD) WITH PROPOFOL;  Surgeon: Harvel Quale, MD;  [facility];  Service: Gastroenterology;  Laterality: N/A;  Am   HERNIA REPAIR  Bilateral    inguinal   LASER ABLATION CONDOLAMATA N/A 12/02/2018   Procedure: LASER ABLATION CONDYLOMA ACCUMINATA LEFT AND RIGHT VULVA, PERINEUM AND PERIANAL (15 TOTAL);  Surgeon: Florian Buff, MD;  Location: [facility];  Service: Gynecology;  Laterality: N/A;   MOUTH SURGERY  09/2021   All top teeth and all wisdom teeth removed   POLYPECTOMY  08/29/2020   Procedure: POLYPECTOMY;  Surgeon: Harvel Quale, MD;  [facility];  Service: Gastroenterology;;    Family History  Problem Relation Age of Onset   Anxiety disorder Mother    Hyperlipidemia Mother    Crohn's disease Sister    Diabetes Maternal Grandfather    Diabetes Cousin    Learning disabilities Cousin    Social History:  reports that she has been smoking cigarettes. She has a 5.00 pack-year smoking history. She has never used smokeless tobacco. She reports that she does not drink alcohol and does not use drugs.  Allergies:  Allergies  Allergen Reactions   Nsaids Other (See Comments)    History of ulcers    Medications Prior to Admission  Medication Sig Dispense Refill   albuterol (VENTOLIN HFA) 108 (90 Base) MCG/ACT inhaler Inhale 2 puffs into the lungs every 6 (six) hours as needed for wheezing or shortness of breath. 1 each 0   butalbital-acetaminophen-caffeine (FIORICET) 50-325-40 MG tablet TAKE 1 TABLET BY MOUTH EVERY 6 HOURS AS NEEDED FOR MIGRAINE HEADACHE 20 tablet 0   diphenhydrAMINE (BENADRYL) 25 MG tablet Take 25 mg by mouth every 6 (six) hours as needed for allergies.     escitalopram (LEXAPRO) 10 MG tablet Take 30 mg by mouth in the morning.     etonogestrel (NEXPLANON) 68 MG IMPL implant 68 mg by Subdermal route once.     HYDROcodone-acetaminophen (NORCO) 10-325 MG tablet Take 1 tablet by mouth in the morning, at noon, in the evening, and at bedtime.     linaclotide (LINZESS) 72 MCG capsule Take 72 mcg by mouth daily as needed (constipation).     LORazepam (ATIVAN) 1 MG tablet Take 1 mg by  mouth 4 (four) times daily as needed for anxiety.     megestrol (MEGACE) 40 MG tablet Take 3 x 5 days, then 2 x 5 days then 1 daily til no bleeding 45 tablet 1   omeprazole (PRILOSEC) 40 MG capsule Take 1 capsule (40 mg total) by mouth 2 (two) times daily. 120 capsule 3   ondansetron (ZOFRAN) 8 MG tablet Take 8 mg by mouth every 8 (eight) hours as needed for nausea or vomiting.     sucralfate (CARAFATE) 1 g tablet Take 1 tablet (1 g total) by mouth 3 (three) times daily before meals. 90 tablet 3   HYDROcodone-acetaminophen (NORCO/VICODIN) 5-325 MG tablet Take 1 tablet by mouth every 6 (six) hours as needed. 6 tablet 0    Results for orders placed or performed during the hospital encounter of 11/19/21 (from the past 48 hour(s))  Pregnancy, urine     Status: None   Collection Time: 11/19/21  9:09 AM  Result Value Ref Range   Preg Test, Ur NEGATIVE NEGATIVE  Comment:        THE SENSITIVITY OF THIS METHODOLOGY IS >20 mIU/mL. Performed at [facility], [address] [place], [place] [address]    No results found.  Review of Systems  Blood pressure 119/72, pulse (!) 56, temperature 98.5 F (36.9 C), temperature source Oral, resp. rate 16, SpO2 98 %. Physical Exam HENT:     Mouth/Throat:     Mouth: Mucous membranes are moist.     Pharynx: Oropharynx is clear.  Eyes:     General: No scleral icterus.    Conjunctiva/sclera: Conjunctivae normal.  Cardiovascular:     Rate and Rhythm: Normal rate and regular rhythm.     Heart sounds: Normal heart sounds. No murmur heard. Pulmonary:     Effort: Pulmonary effort is normal.     Breath sounds: Normal breath sounds.  Abdominal:     General: There is no distension.     Palpations: Abdomen is soft. There is no mass.     Tenderness: There is abdominal tenderness.     Comments: Mild tenderness midepigastric region.  Musculoskeletal:        General: No swelling.     Cervical back: Neck supple.  Lymphadenopathy:     Cervical: No cervical  adenopathy.  Skin:    General: Skin is warm and dry.  Neurological:     Mental Status: She is alert.     Assessment/Plan  Acute on chronic epigastric pain unresponsive to therapy. Diagnostic esophagogastroduodenoscopy.  Hildred Laser, MD 11/21/2021, 8:11 AM

## 2021-11-21 NOTE — Anesthesia Procedure Notes (Signed)
Date/Time: 11/21/2021 8:13 AM ?Performed by: Vista Deck, CRNA ?Pre-anesthesia Checklist: Patient identified, Emergency Drugs available, Suction available, Timeout performed and Patient being monitored ?Patient Re-evaluated:Patient Re-evaluated prior to induction ?Oxygen Delivery Method: Nasal Cannula ? ? ? ? ?

## 2021-11-21 NOTE — Op Note (Signed)
[facility] ?Patient Name: Debbie Santana ?Procedure Date: 11/21/2021 7:58 AM ?MRN: [id number] ?Date of Birth: 1990/12/19 ?Attending MD: Hildred Laser , MD ?CSN: [id number] ?Age: 31 ?Admit Type: Outpatient ?Procedure:                Upper GI endoscopy ?Indications:              Epigastric abdominal pain ?Providers:                Hildred Laser, MD, McClure Page, Wynonia Musty  ?                          Tech, Technician ?Referring MD:             Lemmie Evens, MD ?Medicines:                Propofol per Anesthesia ?Complications:            No immediate complications. ?Estimated Blood Loss:     Estimated blood loss was minimal. ?Procedure:                Pre-Anesthesia Assessment: ?                          - Prior to the procedure, a History and Physical  ?                          was performed, and patient medications and  ?                          allergies were reviewed. The patient's tolerance of  ?                          previous anesthesia was also reviewed. The risks  ?                          and benefits of the procedure and the sedation  ?                          options and risks were discussed with the patient.  ?                          All questions were answered, and informed consent  ?                          was obtained. Prior Anticoagulants: The patient has  ?                          taken no previous anticoagulant or antiplatelet  ?                          agents. ASA Grade Assessment: II - A patient with  ?                          mild systemic disease. After reviewing the risks  ?  and benefits, the patient was deemed in  ?                          satisfactory condition to undergo the procedure. ?                          After obtaining informed consent, the endoscope was  ?                          passed under direct vision. Throughout the  ?                          procedure, the patient's blood pressure, pulse, and  ?                           oxygen saturations were monitored continuously. The  ?                          GIF-H190 ([id number]) scope was introduced through the  ?                          mouth, and advanced to the second part of duodenum.  ?                          The upper GI endoscopy was accomplished without  ?                          difficulty. The patient tolerated the procedure  ?                          well. ?Scope In: 8:19:23 AM ?Scope Out: 8:27:20 AM ?Total Procedure Duration: 0 hours 7 minutes 57 seconds  ?Findings: ?     The hypopharynx was normal. ?     The examined esophagus was normal. ?     The Z-line was irregular and was found 37 cm from the incisors. ?     A few erosions with no stigmata of recent bleeding were found in the  ?     gastric antrum. Biopsies were taken with a cold forceps for histology. ?     The exam of the stomach was otherwise normal. ?     The duodenal bulb and second portion of the duodenum were normal. ?Impression:               - Normal hypopharynx. ?                          - Normal esophagus. ?                          - Z-line irregular, 37 cm from the incisors. ?                          - Erosive gastropathy with no stigmata of recent  ?  bleeding. Biopsied. ?                          - Normal duodenal bulb and second portion of the  ?                          duodenum. ?Moderate Sedation: ?     Per Anesthesia Care ?Recommendation:           - Patient has a contact number available for  ?                          emergencies. The signs and symptoms of potential  ?                          delayed complications were discussed with the  ?                          patient. Return to normal activities tomorrow.  ?                          Written discharge instructions were provided to the  ?                          patient. ?                          - Resume previous diet today. ?                          - Continue present medications. ?                          -  Levsin sublingual 1 tablet 4 times daily as  ?                          needed. ?                          - No aspirin, ibuprofen, naproxen, or other  ?                          non-steroidal anti-inflammatory drugs for 1 day. ?                          - Await pathology results. ?Procedure Code(s):        --- Professional --- ?                          [phone], Esophagogastroduodenoscopy, flexible,  ?                          transoral; with biopsy, single or multiple ?Diagnosis Code(s):        --- Professional --- ?                          K22.8, Other specified diseases of esophagus ?  K31.89, Other diseases of stomach and duodenum ?                          R10.13, Epigastric pain ?CPT copyright 2019 [employer]. All rights reserved. ?The codes documented in this report are preliminary and upon coder review may  ?be revised to meet current compliance requirements. ?Hildred Laser, MD ?Hildred Laser, MD ?11/21/2021 8:35:36 AM ?This report has been signed electronically. ?Number of Addenda: 0 ?

## 2021-11-21 NOTE — Anesthesia Preprocedure Evaluation (Signed)
Anesthesia Evaluation  ?Patient identified by MRN, date of birth, ID band ?Patient awake ? ? ? ?Reviewed: ?Allergy & Precautions, NPO status , Patient's Chart, lab work & pertinent test results ? ?History of Anesthesia Complications ?(+) PONV and history of anesthetic complications ? ?Airway ?Mallampati: II ? ?TM Distance: >3 FB ?Neck ROM: Full ? ? ? Dental ? ?(+) Dental Advisory Given, Edentulous Upper, Missing ?  ?Pulmonary ?Current Smoker and Patient abstained from smoking.,  ?  ?Pulmonary exam normal ?breath sounds clear to auscultation ? ? ? ? ? ? Cardiovascular ?Exercise Tolerance: Good ?hypertension, Pt. on medications ?Normal cardiovascular exam ?Rhythm:Regular Rate:Normal ? ? ?  ?Neuro/Psych ? Headaches, PSYCHIATRIC DISORDERS Anxiety Depression  Neuromuscular disease   ? GI/Hepatic ?Neg liver ROS, PUD, GERD  Medicated and Controlled,  ?Endo/Other  ?negative endocrine ROS ? Renal/GU ?negative Renal ROS  ? ?  ?Musculoskeletal ?negative musculoskeletal ROS ?(+)  ? Abdominal ?  ?Peds ? Hematology ?negative hematology ROS ?(+)   ?Anesthesia Other Findings ?Back pain ? Reproductive/Obstetrics ?negative OB ROS ? ?  ? ? ? ? ? ? ? ? ? ? ? ? ? ?  ?  ? ? ? ? ? ? ? ? ?Anesthesia Physical ?Anesthesia Plan ? ?ASA: 2 ? ?Anesthesia Plan: General  ? ?Post-op Pain Management: Minimal or no pain anticipated  ? ?Induction: Intravenous ? ?PONV Risk Score and Plan: TIVA ? ?Airway Management Planned: Nasal Cannula and Natural Airway ? ?Additional Equipment:  ? ?Intra-op Plan:  ? ?Post-operative Plan:  ? ?Informed Consent: I have reviewed the patients History and Physical, chart, labs and discussed the procedure including the risks, benefits and alternatives for the proposed anesthesia with the patient or authorized representative who has indicated his/her understanding and acceptance.  ? ? ? ?Dental advisory given ? ?Plan Discussed with: CRNA and Surgeon ? ?Anesthesia Plan Comments:    ? ? ? ? ? ? ?Anesthesia Quick Evaluation ? ?

## 2021-11-22 ENCOUNTER — Telehealth (INDEPENDENT_AMBULATORY_CARE_PROVIDER_SITE_OTHER): Payer: Self-pay

## 2021-11-22 DIAGNOSIS — M4807 Spinal stenosis, lumbosacral region: Secondary | ICD-10-CM | POA: Diagnosis not present

## 2021-11-22 DIAGNOSIS — M5441 Lumbago with sciatica, right side: Secondary | ICD-10-CM | POA: Diagnosis not present

## 2021-11-22 DIAGNOSIS — M545 Low back pain, unspecified: Secondary | ICD-10-CM | POA: Diagnosis not present

## 2021-11-22 NOTE — Telephone Encounter (Signed)
[employer] denied payment on Hyoscyamine. Patient aware to use a good rx card at [facility] and pick up for cash price without insurance of $22.26. ?

## 2021-11-23 ENCOUNTER — Other Ambulatory Visit (INDEPENDENT_AMBULATORY_CARE_PROVIDER_SITE_OTHER): Payer: Self-pay

## 2021-11-23 DIAGNOSIS — K3189 Other diseases of stomach and duodenum: Secondary | ICD-10-CM

## 2021-11-23 LAB — SURGICAL PATHOLOGY

## 2021-11-26 ENCOUNTER — Other Ambulatory Visit: Payer: Self-pay | Admitting: Obstetrics and Gynecology

## 2021-11-26 ENCOUNTER — Other Ambulatory Visit: Payer: Self-pay

## 2021-11-26 NOTE — Patient Outreach (Signed)
Care Coordination ? ?11/26/2021 ? ?Debbie Santana ?April 25, 1991 ?[id number] ? ?RNCM called patient at scheduled time.  Patient's Mother answered the phone and stated patient was not at home. ? ?Aida Raider RN, BSN ?[facility] ?Care Management Coordinator - Managed Medicaid High Risk ?[phone] ?  ? ? ?

## 2021-11-27 ENCOUNTER — Encounter (HOSPITAL_COMMUNITY): Payer: Self-pay | Admitting: Internal Medicine

## 2021-11-27 DIAGNOSIS — M5126 Other intervertebral disc displacement, lumbar region: Secondary | ICD-10-CM | POA: Diagnosis not present

## 2021-11-27 DIAGNOSIS — M545 Low back pain, unspecified: Secondary | ICD-10-CM | POA: Diagnosis not present

## 2021-11-28 ENCOUNTER — Other Ambulatory Visit: Payer: Self-pay | Admitting: Neurosurgery

## 2021-11-28 DIAGNOSIS — M5116 Intervertebral disc disorders with radiculopathy, lumbar region: Secondary | ICD-10-CM | POA: Diagnosis not present

## 2021-11-29 ENCOUNTER — Other Ambulatory Visit: Payer: Self-pay | Admitting: Neurosurgery

## 2021-11-30 ENCOUNTER — Other Ambulatory Visit: Payer: Self-pay

## 2021-11-30 ENCOUNTER — Encounter (HOSPITAL_COMMUNITY): Payer: Self-pay | Admitting: Neurosurgery

## 2021-11-30 NOTE — Progress Notes (Signed)
Spoke with pt for pre-op call. 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 Pt states she has severe anxiety. ? ?Will need Covid testing on arrival.  ?

## 2021-12-01 ENCOUNTER — Emergency Department (HOSPITAL_COMMUNITY)
Admission: EM | Admit: 2021-12-01 | Discharge: 2021-12-01 | Discharge status: Absent without leave | Payer: Medicaid Other | Source: Home / Self Care

## 2021-12-01 ENCOUNTER — Other Ambulatory Visit (HOSPITAL_COMMUNITY)
Admission: RE | Admit: 2021-12-01 | Discharge: 2021-12-01 | Disposition: A | Discharge status: Home / Self Care | Payer: Medicaid Other | Attending: Physical Medicine and Rehabilitation | Admitting: Physical Medicine and Rehabilitation

## 2021-12-03 ENCOUNTER — Other Ambulatory Visit: Payer: Self-pay

## 2021-12-03 ENCOUNTER — Ambulatory Visit (HOSPITAL_BASED_OUTPATIENT_CLINIC_OR_DEPARTMENT_OTHER): Payer: Medicaid Other | Admitting: Certified Registered"

## 2021-12-03 ENCOUNTER — Encounter (HOSPITAL_COMMUNITY)
Admission: RE | Disposition: A | Discharge status: Home / Self Care | Payer: Self-pay | Source: Home / Self Care | Attending: Neurosurgery

## 2021-12-03 ENCOUNTER — Ambulatory Visit (HOSPITAL_COMMUNITY)
Admission: RE | Admit: 2021-12-03 | Discharge: 2021-12-03 | Disposition: A | Discharge status: Home / Self Care | Payer: Medicaid Other | Attending: Neurosurgery | Admitting: Neurosurgery

## 2021-12-03 ENCOUNTER — Encounter (HOSPITAL_COMMUNITY): Payer: Self-pay | Admitting: Neurosurgery

## 2021-12-03 ENCOUNTER — Ambulatory Visit (HOSPITAL_COMMUNITY): Payer: Medicaid Other | Admitting: Certified Registered"

## 2021-12-03 ENCOUNTER — Ambulatory Visit (HOSPITAL_COMMUNITY): Payer: Medicaid Other

## 2021-12-03 DIAGNOSIS — Z981 Arthrodesis status: Secondary | ICD-10-CM | POA: Diagnosis not present

## 2021-12-03 DIAGNOSIS — Z20822 Contact with and (suspected) exposure to covid-19: Secondary | ICD-10-CM | POA: Insufficient documentation

## 2021-12-03 DIAGNOSIS — Z8711 Personal history of peptic ulcer disease: Secondary | ICD-10-CM | POA: Insufficient documentation

## 2021-12-03 DIAGNOSIS — I10 Essential (primary) hypertension: Secondary | ICD-10-CM | POA: Diagnosis not present

## 2021-12-03 DIAGNOSIS — K219 Gastro-esophageal reflux disease without esophagitis: Secondary | ICD-10-CM | POA: Insufficient documentation

## 2021-12-03 DIAGNOSIS — F419 Anxiety disorder, unspecified: Secondary | ICD-10-CM | POA: Insufficient documentation

## 2021-12-03 DIAGNOSIS — F32A Depression, unspecified: Secondary | ICD-10-CM | POA: Diagnosis not present

## 2021-12-03 DIAGNOSIS — Z419 Encounter for procedure for purposes other than remedying health state, unspecified: Secondary | ICD-10-CM

## 2021-12-03 DIAGNOSIS — R519 Headache, unspecified: Secondary | ICD-10-CM | POA: Diagnosis not present

## 2021-12-03 DIAGNOSIS — M5116 Intervertebral disc disorders with radiculopathy, lumbar region: Secondary | ICD-10-CM | POA: Diagnosis not present

## 2021-12-03 HISTORY — DX: Wheezing: R06.2

## 2021-12-03 HISTORY — DX: Gastro-esophageal reflux disease without esophagitis: K21.9

## 2021-12-03 HISTORY — PX: LUMBAR LAMINECTOMY/DECOMPRESSION MICRODISCECTOMY: SHX5026

## 2021-12-03 LAB — CBC
HCT: 44.3 % (ref 36.0–46.0)
Hemoglobin: 15 g/dL (ref 12.0–15.0)
MCH: 32.5 pg (ref 26.0–34.0)
MCHC: 33.9 g/dL (ref 30.0–36.0)
MCV: 95.9 fL (ref 80.0–100.0)
Platelets: 201 10*3/uL (ref 150–400)
RBC: 4.62 MIL/uL (ref 3.87–5.11)
RDW: 11.9 % (ref 11.5–15.5)
WBC: 10.3 10*3/uL (ref 4.0–10.5)
nRBC: 0 % (ref 0.0–0.2)

## 2021-12-03 LAB — SARS CORONAVIRUS 2 BY RT PCR (HOSPITAL ORDER, PERFORMED IN ~~LOC~~ HOSPITAL LAB): SARS Coronavirus 2: NEGATIVE

## 2021-12-03 LAB — SURGICAL PCR SCREEN
MRSA, PCR: NEGATIVE
Staphylococcus aureus: NEGATIVE

## 2021-12-03 LAB — POCT PREGNANCY, URINE: Preg Test, Ur: NEGATIVE

## 2021-12-03 SURGERY — LUMBAR LAMINECTOMY/DECOMPRESSION MICRODISCECTOMY 1 LEVEL
Anesthesia: General | Site: Spine Lumbar | Laterality: Right

## 2021-12-03 MED ORDER — DEXAMETHASONE SODIUM PHOSPHATE 10 MG/ML IJ SOLN
INTRAMUSCULAR | Status: AC
  Filled 2021-12-03: qty 1

## 2021-12-03 MED ORDER — FENTANYL CITRATE (PF) 100 MCG/2ML IJ SOLN
25.0000 ug | INTRAMUSCULAR | Status: DC | PRN
  Administered 2021-12-03 (×3): 50 ug via INTRAVENOUS

## 2021-12-03 MED ORDER — ROCURONIUM BROMIDE 10 MG/ML (PF) SYRINGE
PREFILLED_SYRINGE | INTRAVENOUS | Status: DC | PRN
Start: 2021-12-03 — End: 2021-12-03
  Administered 2021-12-03: 60 mg via INTRAVENOUS
  Administered 2021-12-03: 20 mg via INTRAVENOUS

## 2021-12-03 MED ORDER — ORAL CARE MOUTH RINSE: 15.0000 mL | Freq: Once | OROMUCOSAL | Status: AC

## 2021-12-03 MED ORDER — SCOPOLAMINE 1 MG/3DAYS TD PT72
MEDICATED_PATCH | TRANSDERMAL | Status: AC
  Administered 2021-12-03: 1.5 mg via TRANSDERMAL
  Filled 2021-12-03: qty 1

## 2021-12-03 MED ORDER — THROMBIN 5000 UNITS EX SOLR
CUTANEOUS | Status: AC
  Filled 2021-12-03: qty 10000

## 2021-12-03 MED ORDER — SCOPOLAMINE 1 MG/3DAYS TD PT72: 1.0000 | MEDICATED_PATCH | TRANSDERMAL | Status: DC

## 2021-12-03 MED ORDER — LACTATED RINGERS IV SOLN: INTRAVENOUS | Status: DC

## 2021-12-03 MED ORDER — CHLORHEXIDINE GLUCONATE CLOTH 2 % EX PADS: 6.0000 | MEDICATED_PAD | Freq: Once | CUTANEOUS | Status: DC

## 2021-12-03 MED ORDER — BACITRACIN ZINC 500 UNIT/GM EX OINT
TOPICAL_OINTMENT | CUTANEOUS | Status: AC
  Filled 2021-12-03: qty 28.35

## 2021-12-03 MED ORDER — ONDANSETRON HCL 4 MG/2ML IJ SOLN
INTRAMUSCULAR | Status: AC
  Filled 2021-12-03: qty 2

## 2021-12-03 MED ORDER — SODIUM CHLORIDE (PF) 0.9 % IJ SOLN
INTRAMUSCULAR | Status: AC
  Filled 2021-12-03: qty 10

## 2021-12-03 MED ORDER — MIDAZOLAM HCL 2 MG/2ML IJ SOLN
INTRAMUSCULAR | Status: AC
  Filled 2021-12-03: qty 2

## 2021-12-03 MED ORDER — PROPOFOL 10 MG/ML IV BOLUS
INTRAVENOUS | Status: AC
  Filled 2021-12-03: qty 20

## 2021-12-03 MED ORDER — OXYCODONE HCL 5 MG/5ML PO SOLN: 5.0000 mg | Freq: Once | ORAL | Status: AC | PRN

## 2021-12-03 MED ORDER — OXYCODONE HCL 5 MG PO TABS
5.0000 mg | ORAL_TABLET | Freq: Once | ORAL | Status: AC | PRN
  Administered 2021-12-03: 5 mg via ORAL

## 2021-12-03 MED ORDER — ACETAMINOPHEN 500 MG PO TABS: 1000.0000 mg | ORAL_TABLET | Freq: Once | ORAL | Status: AC

## 2021-12-03 MED ORDER — CHLORHEXIDINE GLUCONATE 0.12 % MT SOLN
15.0000 mL | Freq: Once | OROMUCOSAL | Status: AC
  Administered 2021-12-03: 15 mL via OROMUCOSAL
  Filled 2021-12-03: qty 15

## 2021-12-03 MED ORDER — LIDOCAINE 2% (20 MG/ML) 5 ML SYRINGE
INTRAMUSCULAR | Status: AC
  Filled 2021-12-03: qty 5

## 2021-12-03 MED ORDER — BACITRACIN ZINC 500 UNIT/GM EX OINT
TOPICAL_OINTMENT | CUTANEOUS | Status: DC | PRN
  Administered 2021-12-03: 1 via TOPICAL

## 2021-12-03 MED ORDER — 0.9 % SODIUM CHLORIDE (POUR BTL) OPTIME
TOPICAL | Status: DC | PRN
  Administered 2021-12-03: 1000 mL

## 2021-12-03 MED ORDER — DEXAMETHASONE SODIUM PHOSPHATE 10 MG/ML IJ SOLN
INTRAMUSCULAR | Status: DC | PRN
Start: 2021-12-03 — End: 2021-12-03
  Administered 2021-12-03: 5 mg via INTRAVENOUS

## 2021-12-03 MED ORDER — FENTANYL CITRATE (PF) 100 MCG/2ML IJ SOLN
INTRAMUSCULAR | Status: AC
  Filled 2021-12-03: qty 2

## 2021-12-03 MED ORDER — OXYCODONE HCL 5 MG PO TABS
ORAL_TABLET | ORAL | Status: AC
  Filled 2021-12-03: qty 1

## 2021-12-03 MED ORDER — SUFENTANIL CITRATE 50 MCG/ML IV SOLN
INTRAVENOUS | Status: AC
  Filled 2021-12-03: qty 1

## 2021-12-03 MED ORDER — ROCURONIUM BROMIDE 10 MG/ML (PF) SYRINGE
PREFILLED_SYRINGE | INTRAVENOUS | Status: AC
  Filled 2021-12-03: qty 10

## 2021-12-03 MED ORDER — SUGAMMADEX SODIUM 200 MG/2ML IV SOLN
INTRAVENOUS | Status: DC | PRN
  Administered 2021-12-03: 200 mg via INTRAVENOUS

## 2021-12-03 MED ORDER — THROMBIN 5000 UNITS EX SOLR
CUTANEOUS | Status: DC | PRN
  Administered 2021-12-03: 5000 [IU] via TOPICAL

## 2021-12-03 MED ORDER — LIDOCAINE 2% (20 MG/ML) 5 ML SYRINGE
INTRAMUSCULAR | Status: DC | PRN
  Administered 2021-12-03: 60 mg via INTRAVENOUS

## 2021-12-03 MED ORDER — CEFAZOLIN SODIUM-DEXTROSE 2-4 GM/100ML-% IV SOLN
2.0000 g | INTRAVENOUS | Status: AC
  Administered 2021-12-03: 2 g via INTRAVENOUS
  Filled 2021-12-03: qty 100

## 2021-12-03 MED ORDER — SUFENTANIL CITRATE 50 MCG/ML IV SOLN
INTRAVENOUS | Status: DC | PRN
  Administered 2021-12-03: 10 ug via INTRAVENOUS
  Administered 2021-12-03: 20 ug via INTRAVENOUS
  Administered 2021-12-03 (×2): 10 ug via INTRAVENOUS

## 2021-12-03 MED ORDER — OXYCODONE-ACETAMINOPHEN 10-325 MG PO TABS: 1.0000 | ORAL_TABLET | Freq: Four times a day (QID) | ORAL | 0 refills | Status: DC | PRN

## 2021-12-03 MED ORDER — FENTANYL CITRATE (PF) 100 MCG/2ML IJ SOLN
INTRAMUSCULAR | Status: DC
Start: 2021-12-03 — End: 2021-12-03
  Filled 2021-12-03: qty 2

## 2021-12-03 MED ORDER — PROPOFOL 10 MG/ML IV BOLUS
INTRAVENOUS | Status: DC | PRN
  Administered 2021-12-03: 160 mg via INTRAVENOUS
  Administered 2021-12-03: 40 mg via INTRAVENOUS

## 2021-12-03 MED ORDER — MIDAZOLAM HCL 2 MG/2ML IJ SOLN
INTRAMUSCULAR | Status: DC | PRN
  Administered 2021-12-03: 2 mg via INTRAVENOUS

## 2021-12-03 MED ORDER — EPHEDRINE SULFATE-NACL 50-0.9 MG/10ML-% IV SOSY
PREFILLED_SYRINGE | INTRAVENOUS | Status: DC | PRN
  Administered 2021-12-03: 10 mg via INTRAVENOUS

## 2021-12-03 MED ORDER — AMISULPRIDE (ANTIEMETIC) 5 MG/2ML IV SOLN: 10.0000 mg | Freq: Once | INTRAVENOUS | Status: DC | PRN

## 2021-12-03 MED ORDER — ONDANSETRON HCL 4 MG/2ML IJ SOLN
INTRAMUSCULAR | Status: DC | PRN
  Administered 2021-12-03: 4 mg via INTRAVENOUS

## 2021-12-03 MED ORDER — ACETAMINOPHEN 500 MG PO TABS
ORAL_TABLET | ORAL | Status: AC
  Administered 2021-12-03: 1000 mg via ORAL
  Filled 2021-12-03: qty 2

## 2021-12-03 MED ORDER — BUPIVACAINE-EPINEPHRINE (PF) 0.5% -1:200000 IJ SOLN
INTRAMUSCULAR | Status: DC | PRN
  Administered 2021-12-03: 20 mL

## 2021-12-03 MED ORDER — BUPIVACAINE-EPINEPHRINE 0.5% -1:200000 IJ SOLN
INTRAMUSCULAR | Status: AC
  Filled 2021-12-03: qty 1

## 2021-12-03 SURGICAL SUPPLY — 44 items
BAG COUNTER SPONGE SURGICOUNT (BAG) ×2 IMPLANT
BAND RUBBER #18 3X1/16 STRL (MISCELLANEOUS) ×2 IMPLANT
BENZOIN TINCTURE PRP APPL 2/3 (GAUZE/BANDAGES/DRESSINGS) ×2 IMPLANT
BUR MATCHSTICK NEURO 3.0 LAGG (BURR) ×2 IMPLANT
BUR PRECISION FLUTE 6.0 (BURR) ×2 IMPLANT
CANISTER SUCT 3000ML PPV (MISCELLANEOUS) ×2 IMPLANT
CARTRIDGE OIL MAESTRO DRILL (MISCELLANEOUS) ×1 IMPLANT
CLSR STERI-STRIP ANTIMIC 1/2X4 (GAUZE/BANDAGES/DRESSINGS) ×1 IMPLANT
DIFFUSER DRILL AIR PNEUMATIC (MISCELLANEOUS) ×2 IMPLANT
DRAPE LAPAROTOMY 100X72X124 (DRAPES) ×2 IMPLANT
DRAPE MICROSCOPE LEICA (MISCELLANEOUS) ×1 IMPLANT
DRSG OPSITE POSTOP 4X6 (GAUZE/BANDAGES/DRESSINGS) ×1 IMPLANT
ELECT BLADE 4.0 EZ CLEAN MEGAD (MISCELLANEOUS) ×2
ELECT REM PT RETURN 9FT ADLT (ELECTROSURGICAL) ×2
ELECTRODE BLDE 4.0 EZ CLN MEGD (MISCELLANEOUS) ×1 IMPLANT
ELECTRODE REM PT RTRN 9FT ADLT (ELECTROSURGICAL) ×1 IMPLANT
GAUZE 4X4 16PLY ~~LOC~~+RFID DBL (SPONGE) ×1 IMPLANT
GLOVE SURG ENC MOIS LTX SZ7.5 (GLOVE) ×2 IMPLANT
GLOVE SURG ENC MOIS LTX SZ8 (GLOVE) ×3 IMPLANT
GLOVE SURG ENC MOIS LTX SZ8.5 (GLOVE) ×2 IMPLANT
GLOVE SURG POLYISO LF SZ7.5 (GLOVE) ×1 IMPLANT
GLOVE SURG UNDER POLY LF SZ6.5 (GLOVE) ×2 IMPLANT
GLOVE SURG UNDER POLY LF SZ7 (GLOVE) ×2 IMPLANT
GLOVE SURG UNDER POLY LF SZ7.5 (GLOVE) ×3 IMPLANT
GOWN STRL REUS W/ TWL LRG LVL3 (GOWN DISPOSABLE) IMPLANT
GOWN STRL REUS W/ TWL XL LVL3 (GOWN DISPOSABLE) ×1 IMPLANT
GOWN STRL REUS W/TWL 2XL LVL3 (GOWN DISPOSABLE) IMPLANT
GOWN STRL REUS W/TWL LRG LVL3 (GOWN DISPOSABLE) ×4
GOWN STRL REUS W/TWL XL LVL3 (GOWN DISPOSABLE) ×4
HEMOSTAT POWDER KIT SURGIFOAM (HEMOSTASIS) ×1 IMPLANT
KIT BASIN OR (CUSTOM PROCEDURE TRAY) ×2 IMPLANT
KIT TURNOVER KIT B (KITS) ×2 IMPLANT
NEEDLE HYPO 22GX1.5 SAFETY (NEEDLE) ×2 IMPLANT
NS IRRIG 1000ML POUR BTL (IV SOLUTION) ×2 IMPLANT
OIL CARTRIDGE MAESTRO DRILL (MISCELLANEOUS) ×2
PACK LAMINECTOMY NEURO (CUSTOM PROCEDURE TRAY) ×2 IMPLANT
PATTIES SURGICAL .5 X1 (DISPOSABLE) IMPLANT
STRIP CLOSURE SKIN 1/2X4 (GAUZE/BANDAGES/DRESSINGS) ×2 IMPLANT
SUT VIC AB 1 CT1 18XBRD ANBCTR (SUTURE) ×1 IMPLANT
SUT VIC AB 1 CT1 8-18 (SUTURE) ×2
SUT VIC AB 2-0 CP2 18 (SUTURE) ×2 IMPLANT
TOWEL GREEN STERILE (TOWEL DISPOSABLE) ×2 IMPLANT
TOWEL GREEN STERILE FF (TOWEL DISPOSABLE) ×2 IMPLANT
WATER STERILE IRR 1000ML POUR (IV SOLUTION) ×2 IMPLANT

## 2021-12-03 NOTE — Discharge Summary (Signed)
Physician Discharge Summary  ? ? ? ?Providing Compassionate, Quality Care - Together ? ? ?Patient ID: ?Debbie Santana ?MRN: [id number] ?DOB/AGE: [birth year] 32 y.o. ? ?Admit date: 12/03/2021 ?Discharge date: 12/03/2021 ? ?Admission Diagnoses: Herniated nucleus pulposus ? ?Discharge Diagnoses:  ?Herniated nucleus pulposus ? ?Discharged Condition: good ? ?Hospital Course: Patient underwent a right L4-5 PLIF by Dr. Arnoldo Morale on 12/03/2021.  She was recovered from anesthesia in the PACU. Her postoperative course has been uncomplicated. She is ambulating independently and without difficulty. She is tolerating a normal diet. She is not having any bowel or bladder dysfunction. Her pain is reasonably controlled with oral pain medication. She is ready for discharge home. ? ? ?Consults: None ? ?Significant Diagnostic Studies: radiology: DG Lumbar Spine 2-3 Views ? ?Result Date: 12/03/2021 ?CLINICAL DATA:  Intraoperative imaging for localization. EXAM: LUMBAR SPINE - 2-3 VIEW COMPARISON:  11/13/2021 and the MRI of 12/28/2020 FINDINGS: Two intraoperative lateral views. The first, labeled 12:32 p.m., localizes the lumbosacral junction. The next image, labeled 12:35 p.m., demonstrates surgical devices projecting posterior to the L5 vertebral body and the L4-5 interspace. IMPRESSION: Intraoperative localization of L5-S1 and L4-5. Electronically Signed   By: Abigail Miyamoto M.D.   On: 12/03/2021 13:47   ? ? ?Treatments: surgery: Right L4-5 intervertebral discectomy using micro-dissection ? ?Discharge Exam: ?Blood pressure 132/81, pulse 90, temperature 97.6 ?F (36.4 ?C), resp. rate 17, height '5\' 9"'$  (1.753 m), weight 90.7 kg, last menstrual period 11/21/2021, SpO2 97 %. ? ?Per report: ?Alert and oriented x 4 ?PERRLA ?CN II-XII grossly intact ?MAE, Strength and sensation intact ?Incision is covered with Honeycomb dressing and Steri Strips; Dressing is clean, dry, and intact ? ? ?Disposition: Discharge disposition: 01-Home or Self  Care ? ? ? ? ? ? ?Discharge Instructions   ? ?  Remove dressing in 72 hours   Complete by: As directed ?  ? Call MD for:  difficulty breathing, headache or visual disturbances   Complete by: As directed ?  ? Call MD for:  extreme fatigue   Complete by: As directed ?  ? Call MD for:  hives   Complete by: As directed ?  ? Call MD for:  persistant dizziness or light-headedness   Complete by: As directed ?  ? Call MD for:  persistant nausea and vomiting   Complete by: As directed ?  ? Call MD for:  redness, tenderness, or signs of infection (pain, swelling, redness, odor or green/yellow discharge around incision site)   Complete by: As directed ?  ? Call MD for:  severe uncontrolled pain   Complete by: As directed ?  ? Call MD for:  temperature >100.4   Complete by: As directed ?  ? Diet - low sodium heart healthy   Complete by: As directed ?  ? Discharge instructions   Complete by: As directed ?  ? Call [phone] for a followup appointment. Take a stool softener while you are using pain medications.  ? Driving Restrictions   Complete by: As directed ?  ? Do not drive for 2 weeks.  ? Increase activity slowly   Complete by: As directed ?  ? Lifting restrictions   Complete by: As directed ?  ? Do not lift more than 5 pounds. No excessive bending or twisting.  ? May shower / Bathe   Complete by: As directed ?  ? Remove the dressing for 3 days after surgery.  You may shower, but leave the incision alone.  ? ?  ? ?  Allergies as of 12/03/2021   ? ?   Reactions  ? Nsaids Other (See Comments)  ? History of ulcers  ? ?  ? ?  ?Medication List  ?  ? ?STOP taking these medications   ? ?HYDROcodone-acetaminophen 10-325 MG tablet ?Commonly known as: NORCO ?  ?HYDROcodone-acetaminophen 5-325 MG tablet ?Commonly known as: NORCO/VICODIN ?  ? ?  ? ?TAKE these medications   ? ?albuterol 108 (90 Base) MCG/ACT inhaler ?Commonly known as: VENTOLIN HFA ?Inhale 2 puffs into the lungs every 6 (six) hours as needed for wheezing or shortness of  breath. ?  ?butalbital-acetaminophen-caffeine 50-325-40 MG tablet ?Commonly known as: FIORICET ?TAKE 1 TABLET BY MOUTH EVERY 6 HOURS AS NEEDED FOR MIGRAINE HEADACHE ?  ?cetirizine 10 MG tablet ?Commonly known as: ZYRTEC ?Take 10 mg by mouth daily. ?  ?diphenhydrAMINE 25 MG tablet ?Commonly known as: BENADRYL ?Take 25 mg by mouth every 6 (six) hours as needed for allergies. ?  ?escitalopram 10 MG tablet ?Commonly known as: LEXAPRO ?Take 30 mg by mouth in the morning. ?  ?etonogestrel 68 MG Impl implant ?Commonly known as: NEXPLANON ?68 mg by Subdermal route once. ?  ?famotidine 20 MG tablet ?Commonly known as: PEPCID ?Take 20 mg by mouth 2 (two) times daily. ?  ?hyoscyamine 0.125 MG tablet ?Commonly known as: LEVSIN ?Take 1 tablet (0.125 mg total) by mouth every 6 (six) hours as needed. ?  ?linaclotide 72 MCG capsule ?Commonly known as: LINZESS ?Take 72 mcg by mouth daily as needed (constipation). ?  ?LORazepam 1 MG tablet ?Commonly known as: ATIVAN ?Take 1 mg by mouth 4 (four) times daily as needed for anxiety. ?  ?megestrol 40 MG tablet ?Commonly known as: MEGACE ?Take 3 x 5 days, then 2 x 5 days then 1 daily til no bleeding ?  ?omeprazole 40 MG capsule ?Commonly known as: PRILOSEC ?Take 1 capsule (40 mg total) by mouth 2 (two) times daily. ?  ?ondansetron 8 MG tablet ?Commonly known as: ZOFRAN ?Take 8 mg by mouth every 8 (eight) hours as needed for nausea or vomiting. ?  ?oxyCODONE-acetaminophen 10-325 MG tablet ?Commonly known as: PERCOCET ?Take 1 tablet by mouth every 6 (six) hours as needed for pain (Post operative pain). ?What changed:  ?when to take this ?reasons to take this ?  ?sucralfate 1 g tablet ?Commonly known as: Carafate ?Take 1 tablet (1 g total) by mouth 3 (three) times daily before meals. ?  ? ?  ? ? Follow-up Information   ? ? Newman Pies, MD Follow up.   ?Specialty: Neurosurgery ?Contact information: ?[address] ?[address] ?[place] [address] ?[phone] ? ? ?  ?  ? ?  ?  ? ?   ? ? ?Signed: ?Viona Gilmore, DNP, AGNP-C ?Nurse Practitioner ? ?[facility] ?[address], [place], [place] [address] ?P: [phone]    F: [phone] ? ?12/03/2021, 3:21 PM ? ?

## 2021-12-03 NOTE — Op Note (Addendum)
Brief history: The patient is a 31 year old white female whose had several previous back surgeries.  She presented with back and right leg pain consistent with a lumbar radiculopathy.  She failed medical management and worked up with a lumbar MRI which demonstrated her disc at L4-5 on the right.  I discussed the various treatment options with her.  She has decided proceed with surgery. ? 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 ?Postoperative diagnosis: The same ? ?Procedure: Right L4-5 intervertebral discectomy using micro-dissection ? ?Surgeon: Dr. Earle Gell ? ?Asst.: Arnetha Massy, NP ? ?Anesthesia: Gen. endotracheal ? ?Estimated blood loss: Minimal ? ?Drains: None ? ?Complications: None ? ?Description of procedure: The patient was brought to the operating room by the anesthesia team. General endotracheal anesthesia was induced. The patient was turned to the prone position on the Wilson frame. The patient's lumbosacral region was then prepared with Betadine scrub and Betadine solution. Sterile drapes were applied. ? ?I then injected the area to be incised with Marcaine with epinephrine solution. I then used a scalpel to make a linear midline incision over the L4-5 intervertebral disc space. I then used electrocautery to perform a right sided subperiosteal dissection exposing the spinous process and lamina of L4-5. We obtained intraoperative radiograph to confirm our location. I then inserted the [facility] retractor for exposure. ? ?We then brought the operative microscope into the field. Under its magnification and illumination we completed the microdissection. I used a high-speed drill to perform a laminotomy at L4-5 on the right. I then used a Kerrison punches to widen the laminotomy and removed the ligamentum flavum at L4-5 on the right. We then used microdissection to free up the thecal sac and the right L5 nerve root from the epidural tissue. I then used a  Kerrison punch to perform a foraminotomy at about the right L5 nerve root. We then using the nerve root retractor to gently retract the thecal sac and the right L5 nerve root medially. This exposed the intervertebral disc. We identified the ruptured disc and remove it with the pituitary forceps.  We identified a hole in the annulus.  We performed a partial intervertebral discectomy with the pituitary forceps. ? ?I then palpated along the ventral surface of the thecal sac and along exit route of the right L5 nerve root and noted that the neural structures were well decompressed. This completed the decompression. ? ?We then obtained hemostasis using bipolar electrocautery. We irrigated the wound out with bacitracin solution. We then removed the retractor. We then reapproximated the patient's thoracolumbar fascia with interrupted #1 Vicryl suture. We then reapproximated the patient's subcutaneous tissue with interrupted 2-0 Vicryl suture. We then reapproximated patient's skin with Steri-Strips and benzoin. The was then coated with bacitracin ointment. The drapes were removed. The patient was subsequently returned to the supine position where they were extubated by the anesthesia team. The patient was then transported to the postanesthesia care unit in stable condition. All sponge instrument and needle counts were reportedly correct at the end of this case. ?  ?

## 2021-12-03 NOTE — Anesthesia Preprocedure Evaluation (Signed)
Anesthesia Evaluation  ?Patient identified by MRN, date of birth, ID band ?Patient awake ? ? ? ?Reviewed: ?Allergy & Precautions, NPO status , Patient's Chart, lab work & pertinent test results ? ?Airway ?Mallampati: II ? ?TM Distance: >3 FB ?Neck ROM: Full ? ? ? Dental ?no notable dental hx. ? ?  ?Pulmonary ?neg pulmonary ROS, Current Smoker and Patient abstained from smoking.,  ?  ?Pulmonary exam normal ? ? ? ? ? ? ? Cardiovascular ?hypertension,  ?Rhythm:Regular Rate:Normal ? ? ?  ?Neuro/Psych ? Headaches, Anxiety Depression   ? GI/Hepatic ?Neg liver ROS, PUD, GERD  Medicated,  ?Endo/Other  ?negative endocrine ROS ? Renal/GU ?negative Renal ROS  ?negative genitourinary ?  ?Musculoskeletal ?Disc herniation  ? Abdominal ?Normal abdominal exam  (+)   ?Peds ? Hematology ?negative hematology ROS ?(+)   ?Anesthesia Other Findings ? ? Reproductive/Obstetrics ? ?  ? ? ? ? ? ? ? ? ? ? ? ? ? ?  ?  ? ? ? ? ? ? ? ? ?Anesthesia Physical ?Anesthesia Plan ? ?ASA: 2 ? ?Anesthesia Plan: General  ? ?Post-op Pain Management: Tylenol PO (pre-op)*  ? ?Induction: Intravenous ? ?PONV Risk Score and Plan: 2 and Ondansetron, Dexamethasone, Midazolam, Treatment may vary due to age or medical condition and Scopolamine patch - Pre-op ? ?Airway Management Planned: Mask and Oral ETT ? ?Additional Equipment: None ? ?Intra-op Plan:  ? ?Post-operative Plan: Extubation in OR ? ?Informed Consent: I have reviewed the patients History and Physical, chart, labs and discussed the procedure including the risks, benefits and alternatives for the proposed anesthesia with the patient or authorized representative who has indicated his/her understanding and acceptance.  ? ? ? ?Dental advisory given ? ?Plan Discussed with: CRNA ? ?Anesthesia Plan Comments: (Lab Results ?     Component                Value               Date                 ?     WBC                      10.3                12/03/2021           ?     HGB                       15.0                12/03/2021           ?     HCT                      44.3                12/03/2021           ?     MCV                      95.9                12/03/2021           ?     PLT  201                 12/03/2021           ?Lab Results ?     Component                Value               Date                 ?     NA                       132 (L)             09/06/2021           ?     K                        3.4 (L)             09/06/2021           ?     CO2                      25                  09/06/2021           ?     GLUCOSE                  94                  09/06/2021           ?     BUN                      15                  09/06/2021           ?     CREATININE               0.67                09/06/2021           ?     CALCIUM                  8.6 (L)             09/06/2021           ?     GFRNONAA                 >60                 09/06/2021          )  ? ? ? ? ? ? ?Anesthesia Quick Evaluation ? ?

## 2021-12-03 NOTE — Discharge Instructions (Signed)
Wound Care Keep incision covered and dry for two days.    Do not put any creams, lotions, or ointments on incision. Leave steri-strips on back.  They will fall off by themselves. You are fine to shower. Let water run over incision and pat dry.  Activity Walk each and every day, increasing distance each day. No lifting greater than 5 lbs.  Avoid excessive back motion. No driving for 2 weeks; may ride as a passenger locally.  Diet Resume your normal diet.   Return to Work Will be discussed at your follow up appointment.  Call Your Doctor If Any of These Occur Redness, drainage, or swelling at the wound.  Temperature greater than 101 degrees. Severe pain not relieved by pain medication. Incision starts to come apart.  Follow Up Appt Call [phone] today for appointment in 2-3 weeks if you don't already have one or for any problems.  If you have any hardware placed in your spine, you will need an x-ray before your appointment.  

## 2021-12-03 NOTE — Anesthesia Postprocedure Evaluation (Signed)
Anesthesia Post Note ? ?Patient: Debbie Santana ? ?Procedure(s) Performed: MICRODISCECTOMY Lumbar four- five (Right: Spine Lumbar) ? ?  ? ?Patient location during evaluation: PACU ?Anesthesia Type: General ?Level of consciousness: awake and alert ?Pain management: pain level controlled ?Vital Signs Assessment: post-procedure vital signs reviewed and stable ?Respiratory status: spontaneous breathing, nonlabored ventilation, respiratory function stable and patient connected to nasal cannula oxygen ?Cardiovascular status: blood pressure returned to baseline and stable ?Postop Assessment: no apparent nausea or vomiting ?Anesthetic complications: no ? ? ?No notable events documented. ? ?Last Vitals:  ?Vitals:  ? 12/03/21 1430 12/03/21 1445  ?BP: 122/79 132/81  ?Pulse: 77 90  ?Resp: 16 17  ?Temp:    ?SpO2: 96% 97%  ?  ?Last Pain:  ?Vitals:  ? 12/03/21 1445  ?TempSrc:   ?PainSc: 7   ? ? ?  ?  ?  ?  ?  ?  ? ?Debbie Santana ? ? ? ? ?

## 2021-12-03 NOTE — Transfer of Care (Signed)
Immediate Anesthesia Transfer of Care Note ? ?Patient: Debbie Santana ? ?Procedure(s) Performed: MICRODISCECTOMY Lumbar four- five (Right: Spine Lumbar) ? ?Patient Location: PACU ? ?Anesthesia Type:General ? ?Level of Consciousness: drowsy and patient cooperative ? ?Airway & Oxygen Therapy: Patient Spontanous Breathing and Patient connected to nasal cannula oxygen ? ?Post-op Assessment: Report given to RN, Post -op Vital signs reviewed and stable and Patient moving all extremities ? ?Post vital signs: Reviewed and stable ? ?Last Vitals:  ?Vitals Value Taken Time  ?BP 134/72 12/03/21 1346  ?Temp 36.4 ?C 12/03/21 1345  ?Pulse 97 12/03/21 1347  ?Resp 19 12/03/21 1347  ?SpO2 100 % 12/03/21 1347  ?Vitals shown include unvalidated device data. ? ?Last Pain:  ?Vitals:  ? 12/03/21 0912  ?TempSrc:   ?PainSc: 8   ?   ? ?Patients Stated Pain Goal: 3 (12/03/21 0912) ? ?Complications: No notable events documented. ?

## 2021-12-03 NOTE — Anesthesia Procedure Notes (Signed)
Procedure Name: Intubation ?Date/Time: 12/03/2021 12:01 PM ?Performed by: Moshe Salisbury, CRNA ?Pre-anesthesia Checklist: Patient identified, Emergency Drugs available, Suction available and Patient being monitored ?Patient Re-evaluated:Patient Re-evaluated prior to induction ?Oxygen Delivery Method: Circle System Utilized ?Preoxygenation: Pre-oxygenation with 100% oxygen ?Induction Type: IV induction ?Ventilation: Mask ventilation without difficulty ?Laryngoscope Size: Mac and 3 ?Grade View: Grade I ?Tube type: Oral ?Tube size: 7.5 mm ?Number of attempts: 1 ?Airway Equipment and Method: Stylet ?Placement Confirmation: ETT inserted through vocal cords under direct vision, positive ETCO2 and breath sounds checked- equal and bilateral ?Secured at: 21 cm ?Tube secured with: Tape ?Dental Injury: Teeth and Oropharynx as per pre-operative assessment  ? ? ? ? ?

## 2021-12-03 NOTE — H&P (Signed)
Subjective: The patient is a 31 year old white female whose had previous back surgery/discectomies.  She is developed recurrent back and right leg pain consistent with a lumbar radiculopathy.  She failed medical management and worked up with a lumbar MRI which demonstrated herniated disc at L4-5 on the right.  I discussed the various treatment options with her.  She has decided to proceed with surgery.  Past Medical History:  Diagnosis Date   Anxiety    Bradycardia 04/17/2017   Chest pain on breathing 02/22/2020   Chronic back pain    Depression    Erosive gastritis 04/11/2017   Gastric ulcer    Genital warts    GERD (gastroesophageal reflux disease)    History of gestational hypertension 05/21/2018   Dx intrapartum   History of kidney stones    Intertrigo 11/16/2019   Intractable abdominal pain 04/11/2017   Intractable vomiting 05/20/2020   Irregular intermenstrual bleeding 02/19/2019   Mental disorder    Migraine without aura and without status migrainosus, not intractable 02/19/2019   Nausea 05/10/2020   Nausea & vomiting 12/07/2018   Nexplanon in place 02/19/2019   Obesity 04/17/2017   Peptic ulcer disease 04/13/2017   Pinched nerve 09/24/2019   Postpartum hypertension 06/23/2018   requiring norvasc, resolved at pp visit   Sciatica    Vaginal Pap smear, abnormal    Wheezing     Past Surgical History:  Procedure Laterality Date   BACK SURGERY     ruptured disc.   BIOPSY  04/14/2017   Procedure: BIOPSY;  Surgeon: Malissa Hippo, MD;  [facility];  Service: Endoscopy;;  gastric   BIOPSY  05/22/2020   Procedure: BIOPSY;  Surgeon: Corbin Ade, MD;  [facility];  Service: Endoscopy;;   BIOPSY  08/29/2020   Procedure: BIOPSY;  Surgeon: Dolores Frame, MD;  [facility];  Service: Gastroenterology;;   BIOPSY  09/09/2020   Procedure: BIOPSY;  Surgeon: Lanelle Bal, DO;  [facility];  Service: Endoscopy;;   duodenum;antral   BIOPSY  11/21/2021   Procedure: BIOPSY;  Surgeon: Malissa Hippo, MD;  [facility];  Service: Endoscopy;;   CERVICAL ABLATION N/A 12/02/2018   Procedure: LASER ABLATION OF CERVIX;  Surgeon: Lazaro Arms, MD;  Location: [facility];  Service: Gynecology;  Laterality: N/A;   CHOLECYSTECTOMY N/A 04/17/2017   Procedure: LAPAROSCOPIC CHOLECYSTECTOMY;  Surgeon: Franky Macho, MD;  Location: [facility];  Service: General;  Laterality: N/A;   COLONOSCOPY WITH PROPOFOL N/A 08/29/2020   Procedure: COLONOSCOPY WITH PROPOFOL;  Surgeon: Dolores Frame, MD;  [facility];  Service: Gastroenterology;  Laterality: N/A;  11:15   COLPOSCOPY     ESOPHAGOGASTRODUODENOSCOPY (EGD) WITH PROPOFOL N/A 04/14/2017   Procedure: ESOPHAGOGASTRODUODENOSCOPY (EGD) WITH PROPOFOL;  Surgeon: Malissa Hippo, MD;  [facility];  Service: Endoscopy;  Laterality: N/A;   ESOPHAGOGASTRODUODENOSCOPY (EGD) WITH PROPOFOL N/A 05/22/2020   Procedure: ESOPHAGOGASTRODUODENOSCOPY (EGD) WITH PROPOFOL;  Surgeon: Corbin Ade, MD;  [facility];  Service: Endoscopy;  Laterality: N/A;   ESOPHAGOGASTRODUODENOSCOPY (EGD) WITH PROPOFOL N/A 09/09/2020   Procedure: ESOPHAGOGASTRODUODENOSCOPY (EGD) WITH PROPOFOL;  Surgeon: Lanelle Bal, DO;  [facility];  Service: Endoscopy;  Laterality: N/A;   ESOPHAGOGASTRODUODENOSCOPY (EGD) WITH PROPOFOL N/A 11/21/2020   Procedure: ESOPHAGOGASTRODUODENOSCOPY (EGD) WITH PROPOFOL;  Surgeon: Dolores Frame, MD;  [facility];  Service: Gastroenterology;  Laterality: N/A;  Am   ESOPHAGOGASTRODUODENOSCOPY (EGD)  WITH PROPOFOL N/A 11/21/2021   Procedure: ESOPHAGOGASTRODUODENOSCOPY (EGD) WITH PROPOFOL;  Surgeon: Malissa Hippo, MD;  [facility];  Service: Endoscopy;  Laterality: N/A;  820   HERNIA REPAIR Bilateral    inguinal   LASER ABLATION CONDOLAMATA N/A 12/02/2018   Procedure: LASER ABLATION  CONDYLOMA ACCUMINATA LEFT AND RIGHT VULVA, PERINEUM AND PERIANAL (15 TOTAL);  Surgeon: Lazaro Arms, MD;  Location: [facility];  Service: Gynecology;  Laterality: N/A;   MOUTH SURGERY  09/2021   All top teeth and all wisdom teeth removed   POLYPECTOMY  08/29/2020   Procedure: POLYPECTOMY;  Surgeon: Dolores Frame, MD;  [facility];  Service: Gastroenterology;;    Allergies  Allergen Reactions   Nsaids Other (See Comments)    History of ulcers    Social History   Tobacco Use   Smoking status: Every Day    Packs/day: 1.00    Years: 5.00    Pack years: 5.00    Types: Cigarettes   Smokeless tobacco: Never  Substance Use Topics   Alcohol use: No    Family History  Problem Relation Age of Onset   Anxiety disorder Mother    Hyperlipidemia Mother    Crohn's disease Sister    Diabetes Maternal Grandfather    Diabetes Cousin    Learning disabilities Cousin    Prior to Admission medications   Medication Sig Start Date End Date Taking? Authorizing Provider  albuterol (VENTOLIN HFA) 108 (90 Base) MCG/ACT inhaler Inhale 2 puffs into the lungs every 6 (six) hours as needed for wheezing or shortness of breath. 09/11/20  Yes Tat, Onalee Hua, MD  butalbital-acetaminophen-caffeine (FIORICET) [phone] MG tablet TAKE 1 TABLET BY MOUTH EVERY 6 HOURS AS NEEDED FOR MIGRAINE HEADACHE 12/04/20  Yes Cyril Mourning A, NP  cetirizine (ZYRTEC) 10 MG tablet Take 10 mg by mouth daily.   Yes [provider]  diphenhydrAMINE (BENADRYL) 25 MG tablet Take 25 mg by mouth every 6 (six) hours as needed for allergies.   Yes [provider]  escitalopram (LEXAPRO) 10 MG tablet Take 30 mg by mouth in the morning.   Yes [provider]  famotidine (PEPCID) 20 MG tablet Take 20 mg by mouth 2 (two) times daily.   Yes [provider]  linaclotide (LINZESS) 72 MCG capsule Take 72 mcg by mouth daily as needed (constipation).   Yes [provider]  LORazepam  (ATIVAN) 1 MG tablet Take 1 mg by mouth 4 (four) times daily as needed for anxiety. 10/22/21  Yes [provider]  megestrol (MEGACE) 40 MG tablet Take 3 x 5 days, then 2 x 5 days then 1 daily til no bleeding 11/08/21  Yes Cyril Mourning A, NP  omeprazole (PRILOSEC) 40 MG capsule Take 1 capsule (40 mg total) by mouth 2 (two) times daily. 11/06/21  Yes Carlan, Chelsea L, NP  ondansetron (ZOFRAN) 8 MG tablet Take 8 mg by mouth every 8 (eight) hours as needed for nausea or vomiting. 08/21/21  Yes [provider]  oxyCODONE-acetaminophen (PERCOCET) 10-325 MG tablet Take 1 tablet by mouth every 4 (four) hours as needed for pain (Pt states she is taking at least 6 times a day).   Yes [provider]  sucralfate (CARAFATE) 1 g tablet Take 1 tablet (1 g total) by mouth 3 (three) times daily before meals. 11/06/21  Yes Carlan, Chelsea L, NP  etonogestrel (NEXPLANON) 68 MG IMPL implant 68 mg by Subdermal route once.    [provider]  HYDROcodone-acetaminophen (NORCO) 10-325 MG tablet Take 1 tablet by mouth in the morning, at noon, in the evening, and at bedtime. 10/30/21   [provider]  HYDROcodone-acetaminophen (NORCO/VICODIN) 5-325 MG tablet Take 1 tablet by mouth every 6 (six) hours as needed. 08/19/21   Burgess Amor, PA-C  hyoscyamine (LEVSIN) 0.125 MG tablet Take 1 tablet (0.125 mg total) by mouth every 6 (six) hours as needed. 11/21/21   Malissa Hippo, MD     Review of Systems  Positive ROS: As above  All other systems have been reviewed and were otherwise negative with the exception of those mentioned in the HPI and as above.  Objective: Vital signs in last 24 hours: Temp:  [98 F (36.7 C)] 98 F (36.7 C) (03/13 0832) Pulse Rate:  [84] 84 (03/13 0832) Resp:  [17] 17 (03/13 0832) BP: (114)/(68) 114/68 (03/13 0832) SpO2:  [98 %] 98 % (03/13 0832) Weight:  [90.7 kg] 90.7 kg (03/13 0832) Estimated body mass index is 29.53 kg/m as calculated from  the following:   Height as of this encounter: 5\' 9"  (1.753 m).   Weight as of this encounter: 90.7 kg.   General Appearance: Alert Head: Normocephalic, without obvious abnormality, atraumatic Eyes: PERRL, conjunctiva/corneas clear, EOM's intact,    Ears: Normal  Throat: Normal  Neck: Supple, Back: Her lumbar incisions are well-healed.  Straight leg raise testing is positive on the right. Lungs: Clear to auscultation bilaterally, respirations unlabored Heart: Regular rate and rhythm, no murmur, rub or gallop Abdomen: Soft, non-tender Extremities: Extremities normal, atraumatic, no cyanosis or edema Skin: unremarkable  NEUROLOGIC:   Mental status: alert and oriented,Motor Exam - grossly normal Sensory Exam - grossly normal Reflexes:  Coordination - grossly normal Gait - grossly normal Balance - grossly normal Cranial Nerves: I: smell Not tested  II: visual acuity  OS: Normal  OD: Normal   II: visual fields Full to confrontation  II: pupils Equal, round, reactive to light  III,VII: ptosis None  III,IV,VI: extraocular muscles  Full ROM  V: mastication Normal  V: facial light touch sensation  Normal  V,VII: corneal reflex  Present  VII: facial muscle function - upper  Normal  VII: facial muscle function - lower Normal  VIII: hearing Not tested  IX: soft palate elevation  Normal  IX,X: gag reflex Present  XI: trapezius strength  5/5  XI: sternocleidomastoid strength 5/5  XI: neck flexion strength  5/5  XII: tongue strength  Normal    Data Review Lab Results  Component Value Date   WBC 10.3 12/03/2021   HGB 15.0 12/03/2021   HCT 44.3 12/03/2021   MCV 95.9 12/03/2021   PLT 201 12/03/2021   Lab Results  Component Value Date   NA 132 (L) 09/06/2021   K 3.4 (L) 09/06/2021   CL 102 09/06/2021   CO2 25 09/06/2021   BUN 15 09/06/2021   CREATININE 0.67 09/06/2021   GLUCOSE 94 09/06/2021   Lab Results  Component Value Date   INR 1.0 09/09/2020     Assessment/Plan: Right L4-5 herniated disc, lumbar radiculopathy, lumbago: I have discussed the situation with the patient.  I reviewed her imaging studies with her and pointed out the abnormalities.  We discussed the various treatment options including surgery.  I described the surgical treatment option of a right L4-5 discectomy.  I have shown her surgical models.  I have given her a surgical pamphlet.  We have discussed the risk, benefits, alternatives, expected postoperative  course, and likelihood of achieving our goals with surgery.  I have answered all her questions.  She has decided proceed with surgery.   Cristi Loron 12/03/2021 11:44 AM

## 2021-12-04 ENCOUNTER — Other Ambulatory Visit (INDEPENDENT_AMBULATORY_CARE_PROVIDER_SITE_OTHER): Payer: Self-pay | Admitting: Internal Medicine

## 2021-12-04 ENCOUNTER — Encounter (INDEPENDENT_AMBULATORY_CARE_PROVIDER_SITE_OTHER): Payer: Self-pay

## 2021-12-04 ENCOUNTER — Other Ambulatory Visit: Payer: Self-pay

## 2021-12-04 ENCOUNTER — Encounter (HOSPITAL_COMMUNITY): Payer: Self-pay | Admitting: Neurosurgery

## 2021-12-04 ENCOUNTER — Other Ambulatory Visit (INDEPENDENT_AMBULATORY_CARE_PROVIDER_SITE_OTHER): Payer: Self-pay

## 2021-12-04 DIAGNOSIS — A048 Other specified bacterial intestinal infections: Secondary | ICD-10-CM | POA: Insufficient documentation

## 2021-12-04 MED ORDER — CLARITHROMYCIN 500 MG PO TABS: 500.0000 mg | ORAL_TABLET | Freq: Two times a day (BID) | ORAL | 0 refills | Status: DC

## 2021-12-04 MED ORDER — PYLERA 140-125-125 MG PO CAPS: 3.0000 | ORAL_CAPSULE | Freq: Three times a day (TID) | ORAL | 0 refills | Status: DC

## 2021-12-04 MED ORDER — AMOXICILLIN 500 MG PO TABS: ORAL_TABLET | ORAL | 0 refills | Status: DC

## 2021-12-05 ENCOUNTER — Telehealth: Payer: Self-pay

## 2021-12-05 ENCOUNTER — Encounter: Payer: Self-pay | Admitting: *Deleted

## 2021-12-05 NOTE — Telephone Encounter (Signed)
Patient aware her insurance will not pay for Pylera. Per Dr. Laural Golden call in Amoxicillin 500 two tablets po bid # 40 and Biaxin 500 mg bid # 20 stay on current ppi and call once this medication is finished so we can repeat testing. Patient aware of all.Medication sent to local pharmacy. ?

## 2021-12-06 ENCOUNTER — Telehealth (INDEPENDENT_AMBULATORY_CARE_PROVIDER_SITE_OTHER): Payer: Self-pay | Admitting: *Deleted

## 2021-12-06 NOTE — Telephone Encounter (Signed)
Once patient finishes she is aware to call the office so she can have a stool antigen two months after per Dr. Laural Golden ?

## 2021-12-06 NOTE — Telephone Encounter (Signed)
Fax from [facility]. Drug interaction between oxycodone and clarithromycin. Can increase level of oxycodone. Per dr Laural Golden pt can drop dose of oxycodone while on claritthromycin. Called pt and discussed and she verbalized understanding. Pharm notified.  ?

## 2021-12-06 NOTE — Telephone Encounter (Signed)
Per [facility] there was an interaction between the biaxan and oxycodone it can increase the dosage of the oxycodone Per Dr. Laural Golden have patient to take half of dose while on the antibiotics. Per Abigail Butts she made patient aware. I  have called Estill Bamberg at [facility] and she will go ahead a fill the medication.  ?

## 2021-12-07 ENCOUNTER — Ambulatory Visit: Payer: Medicaid Other | Admitting: Diagnostic Neuroimaging

## 2021-12-10 NOTE — Telephone Encounter (Signed)
error 

## 2021-12-12 ENCOUNTER — Ambulatory Visit (HOSPITAL_BASED_OUTPATIENT_CLINIC_OR_DEPARTMENT_OTHER): Payer: Medicaid Other

## 2021-12-13 ENCOUNTER — Ambulatory Visit (HOSPITAL_BASED_OUTPATIENT_CLINIC_OR_DEPARTMENT_OTHER): Payer: Medicaid Other

## 2021-12-19 ENCOUNTER — Other Ambulatory Visit: Payer: Self-pay

## 2021-12-19 ENCOUNTER — Other Ambulatory Visit: Payer: Self-pay | Admitting: Obstetrics and Gynecology

## 2021-12-19 NOTE — Patient Outreach (Signed)
?Medicaid Managed Care   ?Nurse Care Manager Note ? ?12/19/2021 ?Name:  Debbie Santana MRN:  [id number] DOB:  May 25, 1991 ? ?Debbie Santana is an 31 y.o. year old female who is a primary patient of Leslie Andrea, MD.  The [facility] Managed Care Coordination team was consulted for assistance with:    ?Chronic healthcare management needs, h/o anxiety/depression, tobacco use, reflux, migraines,  ? ?Debbie Santana was given information about Medicaid Managed Care Coordination team services today. [name] Patient agreed to services and verbal consent obtained. ? ?Engaged with patient by telephone for follow up visit in response to provider referral for case management and/or care coordination services.  ? ?Assessments/Interventions:  Review of past medical history, allergies, medications, health status, including review of consultants reports, laboratory and other test data, was performed as part of comprehensive evaluation and provision of chronic care management services. ? ?SDOH (Social Determinants of Health) assessments and interventions performed: ?SDOH Interventions   ? ?Flowsheet Row Most Recent Value  ?SDOH Interventions   ?Housing Interventions Intervention Not Indicated  ? ?  ?Care Plan ? ?Allergies  ?Allergen Reactions  ? Nsaids Other (See Comments)  ?  History of ulcers  ? ?Medications Reviewed Today   ? ? Reviewed by Gayla Medicus, RN (Registered Nurse) on 12/19/21 at [phone]  Med List Status: <None>  ? ?Medication Order Taking? Sig Documenting Provider Last Dose Status Informant  ?albuterol (VENTOLIN HFA) 108 (90 Base) MCG/ACT inhaler [id number] No Inhale 2 puffs into the lungs every 6 (six) hours as needed for wheezing or shortness of breath. Orson Eva, MD 12/03/2021 Active Self  ?amoxicillin (AMOXIL) 500 MG tablet [id number]  Take two tablets (1000 mg total) bid Rogene [place], MD  Active   ?butalbital-acetaminophen-caffeine (FIORICET) 50-325-40 MG tablet [id number] No TAKE 1 TABLET BY MOUTH EVERY 6 HOURS  AS NEEDED FOR MIGRAINE HEADACHE Estill Dooms, NP Past Week Active Self  ?cetirizine (ZYRTEC) 10 MG tablet [id number] No Take 10 mg by mouth daily. [provider] 12/02/2021 Active   ?clarithromycin (BIAXIN) 500 MG tablet [id number]  Take 1 tablet (500 mg total) by mouth 2 (two) times daily. Rogene [place], MD  Active   ?diphenhydrAMINE (BENADRYL) 25 MG tablet [id number] No Take 25 mg by mouth every 6 (six) hours as needed for allergies. [provider] Past Week Active Self  ?escitalopram (LEXAPRO) 10 MG tablet [id number] No Take 30 mg by mouth in the morning. [provider] 12/03/2021 0600 Active Self  ?         ?Med Note Stan Head, Quinn Axe   Fri Nov 30, 2021  1:20 PM) Now taking 40 mg daily  ?etonogestrel (NEXPLANON) 68 MG IMPL implant [id number] No 68 mg by Subdermal route once. [provider] Taking Active Self  ?         ?Med Note Luiz Ochoa Nov 14, 2020  4:15 PM)    ?famotidine (PEPCID) 20 MG tablet [id number] No Take 20 mg by mouth 2 (two) times daily. [provider] 12/02/2021 Active   ?hyoscyamine (LEVSIN) 0.125 MG tablet [id number]  Take 1 tablet (0.125 mg total) by mouth every 6 (six) hours as needed. Rogene [place], MD  Active   ?linaclotide Rolan Lipa) 72 MCG capsule [id number] No Take 72 mcg by mouth daily as needed (constipation). [provider] Past Week Active Self  ?LORazepam (ATIVAN) 1 MG tablet [id number] No Take 1 mg by mouth 4 (four) times daily as needed for  anxiety. [provider] 12/03/2021 0600 Active Self  ?megestrol (MEGACE) 40 MG tablet [id number] No Take 3 x 5 days, then 2 x 5 days then 1 daily til no bleeding Estill Dooms, NP Past Week Active Self  ?         ?Med Note Nat Christen   Wed Nov 14, 2021  2:21 PM) Currently on 2 per day stage  ?omeprazole (PRILOSEC) 40 MG capsule [id number] No Take 1 capsule (40 mg total) by mouth 2 (two) times daily. Gabriel Rung, NP 12/02/2021 Active Self   ?ondansetron (ZOFRAN) 8 MG tablet [id number] No Take 8 mg by mouth every 8 (eight) hours as needed for nausea or vomiting. [provider] 12/03/2021 0600 Active Self  ?oxyCODONE-acetaminophen (PERCOCET) 10-325 MG tablet [id number]  Take 1 tablet by mouth every 6 (six) hours as needed for pain (Post operative pain). Viona Gilmore D, NP  Active   ?sucralfate (CARAFATE) 1 g tablet [id number] No Take 1 tablet (1 g total) by mouth 3 (three) times daily before meals. Gabriel Rung, NP 12/03/2021 0600 Active Self  ? ?  ?  ? ?  ? ?Patient Active Problem List  ? Diagnosis Date Noted  ? Positive H. pylori test 12/04/2021  ? Drug-induced constipation 11/06/2021  ? Rectal bleeding 11/06/2021  ? Atypical squamous cells of undetermined significance on cytologic smear of cervix (ASC-US) 10/31/2020  ? High grade squamous intraepithelial cervical dysplasia, S/P cervical conization 10/31/2020  ? IBS (irritable bowel syndrome) 10/30/2020  ? Encounter for support and coordination of transition of care 09/14/2020  ? Cannabis hyperemesis syndrome concurrent with and due to cannabis abuse ([place]) 09/09/2020  ? Chronic abdominal pain   ? Chronic radicular lumbar pain 08/23/2020  ? Chronic pain syndrome 08/23/2020  ? 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 Post laminectomy syndrome 08/23/2020  ? Frequent headaches 08/10/2020  ? Nexplanon in place 08/10/2020  ? Irregular intermenstrual bleeding 08/10/2020  ? Obesity (BMI 30-39.9) 05/20/2020  ? 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 Panic attack 05/18/2020  ? Abdominal pain, chronic, epigastric 05/18/2020  ? Agitated 05/18/2020  ? Gastroesophageal reflux disease 05/10/2020  ? Alternating constipation and diarrhea 05/10/2020  ? Bilateral wheezing 02/22/2020  ? Sleep disturbance 02/22/2020  ? Anxiety and depression 02/19/2019  ? Intractable nausea and vomiting 12/07/2018  ? Low back pain 12/07/2018  ? Chronic diarrhea 12/07/2018  ? Condyloma 04/15/2018  ?  History of abnormal cervical Pap smear 11/04/2017  ? Reflux esophagitis 04/17/2017  ? Tobacco abuse 04/17/2017  ? Peptic ulcer disease 04/13/2017  ? Erosive gastritis 04/11/2017  ? Anxiety 04/11/2017  ? ?Conditions to be addressed/monitored per PCP order:  Chronic healthcare management needs, h/o anxiety/depression, tobacco use, reflux, migraines,  ? ?Care Plan : RN Care Manager Plan of Care  ?Updates made by Gayla Medicus, RN since 12/19/2021 12:00 AM  ?  ? ?Problem: Care Coordination needs   ?Priority: High  ?Onset Date: 07/25/2021  ?  ? ?Long-Range Goal: Establish Plan of Care and provide Care Coordination Needs   ?Start Date: 07/25/2021  ?Expected End Date: 03/21/2022  ?Priority: High  ?Note:   ?Current Barriers: ?Chronic Disease Management support and education needs ?12/19/21:  Patient without complaint today-recent back surgery and is recovering well.  No change in hands shaking-has follow up appt scheduled. ? ?RNCM Clinical Goal(s):  ?Patient will verbalize understanding of plan for management of Anxiety, Depression, Tobacco Use, and back pain ?verbalize basic understanding of  Anxiety,  Depression, Tobacco Use, and back pain ?take all medications exactly as prescribed and will call provider for medication related questions ?demonstrate understanding of rationale for each prescribed medication  ?attend all scheduled medical appointments:  ?continue to work with RN Care Manager to address care management and care coordination needs related to  Anxiety, Depression, Tobacco Use, and back pain ?work with pharmacist to address medications ?work with [employer] care guide to address needs related to  [employer] of Data processing manager for  dental and eye provider through collaboration with Consulting civil engineer, provider, and care team.  ?Patient will schedule an appointment with dental provider-completed ? ?Interventions: ?Inter-disciplinary care team collaboration (see longitudinal plan of care) ?Evaluation of  current treatment plan related to  self management and patient's adherence to plan as established by provider ?Care guide/ BSW  referral for  eye provider ?09/21/21-patient states she no longer needs dental pro

## 2021-12-19 NOTE — Patient Instructions (Signed)
Hi Ms. Orchard, thank you for speaking with me, I am glad you are feeling better. ? ?Ms. Hoggard was given information about Medicaid Managed Care team care coordination services as a part of their Healthy [facility] Medicaid benefit. [name] verbally consented to engagement with the [facility] Managed Care team.  ? ?If you are experiencing a medical emergency, please call 911 or report to your local emergency department or urgent care.  ? ?If you have a non-emergency medical problem during routine business hours, please contact your provider's office and ask to speak with a nurse.  ? ?For questions related to your Healthy [facility] health plan, please call: [phone] or visit the homepage here: [email or web address] ? ?If you would like to schedule transportation through your Healthy [facility] plan, please call the following number at least 2 days in advance of your appointment: [phone] ? For information about your ride after you set it up, call Ride Assist at [phone]. Use this number to activate a Will Call pickup, or if your transportation is late for a scheduled pickup. Use this number, too, if you need to make a change or cancel a previously scheduled reservation. ? If you need transportation services right away, call [phone]. The after-hours call center is staffed 24 hours to handle ride assistance and urgent reservation requests (including discharges) 365 days a year. Urgent trips include sick visits, hospital discharge requests and life-sustaining treatment. ? ?Call the [facility] at [phone], at any time, 24 hours a day, 7 days a week. If you are in danger or need immediate medical attention call 911. ? ?If you would like help to quit smoking, call 1-800-QUIT-NOW 6305604835) OR Espa?ol: 1-855-D?jelo-Ya (416)595-3868) o para m?s informaci?n haga clic aqu? or Text READY to 200-400 to register via text ? ?Ms. Delmont -  following are the goals we discussed in your visit today:  ? Goals Addressed   ? ?Long-Range Goal: Establish Plan of Care for Chronic Disease Management Needs   ?Start Date: 12/19/2021  ?Expected End Date: 03/21/2022  ?Priority: High  ?Note:   ?Timeframe:  Long-Range Goal ?Priority:  High ?Start Date:        12/17/21                     ?Expected End Date: ongoing                   ? ?Follow Up Date 01/19/22  ?  ?- practice safe sex ?- schedule appointment for flu shot ?- schedule appointment for vaccines needed due to my age or health ?- schedule recommended health tests (blood work, mammogram, colonoscopy, pap test) ?- schedule and keep appointment for annual check-up  ?  ?Why is this important?   ?Screening tests can find diseases early when they are easier to treat.  ?Your doctor or nurse will talk with you about which tests are important for you.  ?Getting shots for common diseases like the flu and shingles will help prevent them.    ? ?Patient verbalizes understanding of instructions and care plan provided today and agrees to view in [place]. Active MyChart status confirmed with patient.   ? ?The Managed Medicaid care management team will reach out to the patient again over the next 30 days.  ?The  Patient  has been provided with contact information for the Managed Medicaid care management team and has been advised to call with any health related questions or concerns.  ? ?Nalleli Largent Quarry manager  RN, BSN ?[facility] ?Care Management Coordinator - Managed Medicaid High Risk ?[phone] ?  ?Following is a copy of your plan of care:  ?Care Plan : [place] of Care  ?Updates made by Gayla Medicus, RN since 12/19/2021 12:00 AM  ?  ? ?Problem: Care Coordination needs   ?Priority: High  ?Onset Date: 07/25/2021  ?  ? ?Long-Range Goal: Establish Plan of Care and provide Care Coordination Needs   ?Start Date: 07/25/2021  ?Expected End Date: 03/21/2022  ?Priority: High  ?Note:   ?Current  Barriers: ?Chronic Disease Management support and education needs ?12/19/21:  Patient without complaint today-recent back surgery and is recovering well.  No change in hands shaking-has follow up appt scheduled. ? ?RNCM Clinical Goal(s):  ?Patient will verbalize understanding of plan for management of Anxiety, Depression, Tobacco Use, and back pain ?verbalize basic understanding of  Anxiety, Depression, Tobacco Use, and back pain ?take all medications exactly as prescribed and will call provider for medication related questions ?demonstrate understanding of rationale for each prescribed medication  ?attend all scheduled medical appointments:  ?continue to work with RN Care Manager to address care management and care coordination needs related to  Anxiety, Depression, Tobacco Use, and back pain ?work with pharmacist to address medications ?work with [employer] care guide to address needs related to  [employer] of Data processing manager for  dental and eye provider through collaboration with Consulting civil engineer, provider, and care team.  ?Patient will schedule an appointment with dental provider-completed ? ?Interventions: ?Inter-disciplinary care team collaboration (see longitudinal plan of care) ?Evaluation of current treatment plan related to  self management and patient's adherence to plan as established by provider ?Care guide/ BSW  referral for  eye provider ?09/21/21-patient states she no longer needs dental provider information, still needs eye provider information-will refer-completed ?Collaboration with care guide and BSW  for resources ?Collaborated with pharmacy ?Pharmacy referral for medication review. ?Patient given 1-800-QUIT NOW number for resources. ? ?SDOH Barriers (Status:  New goal. ?Patient interviewed and SDOH assessment performed ?       ?SDOH Interventions   ? ?Flowsheet Row Most Recent Value  ?SDOH Interventions   ?Food Insecurity Interventions Intervention Not Indicated  ?Housing  Interventions Intervention Not Indicated  ?Transportation Interventions Intervention Not Indicated  ? ?  ?Patient interviewed and appropriate assessments performed ?Referred patient to community resources care guide team for assistance with eye and dental reosurces ?Discussed plans with patient for ongoing care management follow up and provided patient with direct contact information for care management team ? ?Patient Goals/Self-Care Activities: ?Patient will self administer medications as prescribed ?Patient will attend all scheduled provider appointments ?Patient will call pharmacy for medication refills ?Patient will continue to perform ADL's independently ?Patient will continue to perform IADL's independently ?Patient will call provider office for new concerns or questions ?Patient will schedule an appointment with dental provider-completed ? ?Follow Up Plan:  The care management team will reach out to the patient again over the next 30 days.   ? ? ?  ?

## 2021-12-22 HISTORY — PX: MOUTH SURGERY: SHX715

## 2022-01-22 DIAGNOSIS — F410 Panic disorder [episodic paroxysmal anxiety] without agoraphobia: Secondary | ICD-10-CM | POA: Diagnosis not present

## 2022-01-22 DIAGNOSIS — M545 Low back pain, unspecified: Secondary | ICD-10-CM | POA: Diagnosis not present

## 2022-01-22 DIAGNOSIS — G4700 Insomnia, unspecified: Secondary | ICD-10-CM | POA: Diagnosis not present

## 2022-01-22 DIAGNOSIS — F330 Major depressive disorder, recurrent, mild: Secondary | ICD-10-CM | POA: Diagnosis not present

## 2022-01-24 ENCOUNTER — Other Ambulatory Visit: Payer: Self-pay | Admitting: Obstetrics and Gynecology

## 2022-01-24 NOTE — Patient Outreach (Signed)
Care Coordination ? ?01/24/2022 ? ?[name] ?1991-08-01 ?[id number] ? ? ?Medicaid Managed Care  ? ?Unsuccessful Outreach Note ? ?01/24/2022 ?Name: Debbie Santana MRN: [id number] DOB: [birth year] ? ?Referred by: Leslie Andrea, MD ?Reason for referral : High Risk Managed Medicaid (Unsuccessful telephone outreach) ? ? ?An unsuccessful telephone outreach was attempted today. The patient was referred to the case management team for assistance with care management and care coordination.  ? ?Follow Up Plan: The care management team will reach out to the patient again over the next 30 business days.  ? ?Aida Raider RN, BSN ?[facility] ?Care Management Coordinator - Managed Medicaid High Risk ?[phone] ?  ? ? ?

## 2022-01-24 NOTE — Patient Instructions (Signed)
Hi Ms. Hauss hope you are doing well-I am sorry I missed you today - as a part of your Medicaid benefit, you are eligible for care management and care coordination services at no cost or copay. I was unable to reach you by phone today but would be happy to help you with your health related needs. Please feel free to call me at [phone] ? ?A member of the Managed Medicaid care management team will reach out to you again over the next 30 business  days.  ? ?Aida Raider RN, BSN ?[facility] ?Care Management Coordinator - Managed Medicaid High Risk ?[phone] ?  ?

## 2022-02-22 DIAGNOSIS — F330 Major depressive disorder, recurrent, mild: Secondary | ICD-10-CM | POA: Diagnosis not present

## 2022-02-26 ENCOUNTER — Other Ambulatory Visit: Payer: Self-pay | Admitting: Obstetrics and Gynecology

## 2022-02-26 NOTE — Patient Instructions (Signed)
Hi Debbie Santana, I am sorry I was unable to reach you today-I hope you are doing well - as a part of your Medicaid benefit, you are eligible for care management and care coordination services at no cost or copay. I was unable to reach you by phone today but would be happy to help you with your health related needs. Please feel free to call me at [phone].  A member of the Managed Medicaid care management team will reach out to you again over the next 30 business days.   Aida Raider RN, BSN [place]  [facility] Curator - Managed Medicaid High Risk [phone]

## 2022-02-26 NOTE — Patient Outreach (Signed)
Care Coordination  02/26/2022  JERLISA DILIBERTO 27-Dec-1990 [id number]   Medicaid Managed Care   Unsuccessful Outreach Note  02/26/2022 Name: GRACE VALLEY MRN: [id number] DOB: 05/04/1991  Referred by: Leslie Andrea, MD Reason for referral : High Risk Managed Medicaid (Unsuccessful telephone outreach)   A second unsuccessful telephone outreach was attempted today. The patient was referred to the case management team for assistance with care management and care coordination.   Follow Up Plan: The care management team will reach out to the patient again over the next 30 business  days.   Aida Raider RN, BSN [place]  [facility] Curator - Managed Medicaid High Risk [phone]

## 2022-03-06 ENCOUNTER — Encounter (INDEPENDENT_AMBULATORY_CARE_PROVIDER_SITE_OTHER): Payer: Self-pay | Admitting: Gastroenterology

## 2022-03-22 DIAGNOSIS — G43909 Migraine, unspecified, not intractable, without status migrainosus: Secondary | ICD-10-CM | POA: Diagnosis not present

## 2022-03-22 DIAGNOSIS — F330 Major depressive disorder, recurrent, mild: Secondary | ICD-10-CM | POA: Diagnosis not present

## 2022-03-22 DIAGNOSIS — R062 Wheezing: Secondary | ICD-10-CM | POA: Diagnosis not present

## 2022-03-22 DIAGNOSIS — R110 Nausea: Secondary | ICD-10-CM | POA: Diagnosis not present

## 2022-03-28 ENCOUNTER — Other Ambulatory Visit: Payer: Self-pay | Admitting: Obstetrics and Gynecology

## 2022-03-28 NOTE — Patient Outreach (Signed)
Medicaid Managed Care   Nurse Care Manager Note  03/28/2022 Name:  Debbie Santana MRN:  [id number] DOB:  07-31-1991  Debbie Santana is an 31 y.o. year old female who is a primary patient of Leslie Andrea, MD.  [facility] Managed Care Coordination team was consulted for assistance with:    Chronic healthcare management needs, tobacco use, h/o anxiety/depression, migraines  Ms. Follansbee was given information about Medicaid Managed Care Coordination team services today. Debbie Santana Patient agreed to services and verbal consent obtained.  Engaged with patient by telephone for follow up visit in response to provider referral for case management and/or care coordination services.   Assessments/Interventions:  Review of past medical history, allergies, medications, health status, including review of consultants reports, laboratory and other test data, was performed as part of comprehensive evaluation and provision of chronic care management services.  SDOH (Social Determinants of Health) assessments and interventions performed: SDOH Interventions    Flowsheet Row Most Recent Value  SDOH Interventions   Stress Interventions Intervention Not Indicated     Care Plan  Allergies  Allergen Reactions   Nsaids Other (See Comments)    History of ulcers    Medications Reviewed Today     Reviewed by Gayla Medicus, RN (Registered Nurse) on 03/28/22 at 1527  Med List Status: <None>   Medication Order Taking? Sig Documenting Provider Last Dose Status Informant  albuterol (VENTOLIN HFA) 108 (90 Base) MCG/ACT inhaler [id number]  Inhale 2 puffs into the lungs every 6 (six) hours as needed for wheezing or shortness of breath. Orson Eva, MD  Active Self  amoxicillin (AMOXIL) 500 MG tablet [id number] No Take two tablets (1000 mg total) bid  Patient not taking: Reported on 03/28/2022   Rogene Houston, MD Not Taking Active   butalbital-acetaminophen-caffeine (FIORICET) 50-325-40 MG tablet [id number]   TAKE 1 TABLET BY MOUTH EVERY 6 HOURS AS NEEDED FOR MIGRAINE HEADACHE Estill Dooms, NP  Active Self  cetirizine (ZYRTEC) 10 MG tablet [id number] No Take 10 mg by mouth daily.  Patient not taking: Reported on 03/28/2022   [provider] Not Taking Active   clarithromycin (BIAXIN) 500 MG tablet [id number] No Take 1 tablet (500 mg total) by mouth 2 (two) times daily.  Patient not taking: Reported on 03/28/2022   Rogene Houston, MD Not Taking Active   diphenhydrAMINE (BENADRYL) 25 MG tablet [id number] No Take 25 mg by mouth every 6 (six) hours as needed for allergies.  Patient not taking: Reported on 03/28/2022   [provider] Not Taking Active Self  escitalopram (LEXAPRO) 10 MG tablet [id number] No Take 30 mg by mouth in the morning.  Patient not taking: Reported on 03/28/2022   [provider] Not Taking Active Self           Med Note Stan Head, Quinn Axe   Fri Nov 30, 2021  1:20 PM) Now taking 40 mg daily  etonogestrel (NEXPLANON) 68 MG IMPL implant [id number]  68 mg by Subdermal route once. [provider]  Active Self           Med Note Nat Christen   Tue Nov 14, 2020  4:15 PM)    famotidine (PEPCID) 20 MG tablet [id number] No Take 20 mg by mouth 2 (two) times daily.  Patient not taking: Reported on 03/28/2022   [provider] Not Taking Active   hyoscyamine (LEVSIN) 0.125 MG tablet [id number] No Take 1 tablet (0.125 mg total) by mouth every  6 (six) hours as needed.  Patient not taking: Reported on 03/28/2022   Rogene Houston, MD Not Taking Active   linaclotide Rolan Lipa) 72 MCG capsule [id number] No Take 72 mcg by mouth daily as needed (constipation).  Patient not taking: Reported on 03/28/2022   [provider] Not Taking Active Self  LORazepam (ATIVAN) 1 MG tablet [id number]  Take 1 mg by mouth 4 (four) times daily as needed for anxiety. [provider]  Active Self  megestrol (MEGACE) 40 MG tablet [id number] No Take 3 x 5 days,  then 2 x 5 days then 1 daily til no bleeding  Patient not taking: Reported on 03/28/2022   Estill Dooms, NP Not Taking Active Self           Med Note Nat Christen   Wed Nov 14, 2021  2:21 PM) Currently on 2 per day stage  omeprazole (PRILOSEC) 40 MG capsule [id number]  Take 1 capsule (40 mg total) by mouth 2 (two) times daily. Gabriel Rung, NP  Active Self  ondansetron (ZOFRAN) 8 MG tablet [id number]  Take 8 mg by mouth every 8 (eight) hours as needed for nausea or vomiting. [provider]  Active Self  oxyCODONE-acetaminophen (PERCOCET) 10-325 MG tablet [id number] No Take 1 tablet by mouth every 6 (six) hours as needed for pain (Post operative pain).  Patient not taking: Reported on 03/28/2022   Viona Gilmore D, NP Not Taking Active   sertraline (ZOLOFT) 50 MG tablet [id number] Yes Take 50 mg by mouth daily. Takes 1 and 1/2 tablet a day [provider]  Active Self  sucralfate (CARAFATE) 1 g tablet [id number]  Take 1 tablet (1 g total) by mouth 3 (three) times daily before meals. Gabriel Rung, NP  Active Self           Patient Active Problem List   Diagnosis Date Noted   Positive H. pylori test 12/04/2021   Drug-induced constipation 11/06/2021   Rectal bleeding 11/06/2021   Atypical squamous cells of undetermined significance on cytologic smear of cervix (ASC-US) 10/31/2020   High grade squamous intraepithelial cervical dysplasia, S/P cervical conization 10/31/2020   IBS (irritable bowel syndrome) 10/30/2020   Encounter for support and coordination of transition of care 09/14/2020   Cannabis hyperemesis syndrome concurrent with and due to cannabis abuse ([place]) 09/09/2020   Chronic abdominal pain    Chronic radicular lumbar pain 08/23/2020   Chronic pain syndrome 08/23/2020   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) 08/23/2020   Post laminectomy syndrome 08/23/2020   Frequent headaches 08/10/2020   Nexplanon in place 08/10/2020   Irregular  intermenstrual bleeding 08/10/2020   Obesity (BMI 30-39.9) 05/20/2020   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   Panic attack 05/18/2020   Abdominal pain, chronic, epigastric 05/18/2020   Agitated 05/18/2020   Gastroesophageal reflux disease 05/10/2020   Alternating constipation and diarrhea 05/10/2020   Bilateral wheezing 02/22/2020   Sleep disturbance 02/22/2020   Anxiety and depression 02/19/2019   Intractable nausea and vomiting 12/07/2018   Low back pain 12/07/2018   Chronic diarrhea 12/07/2018   Condyloma 04/15/2018   History of abnormal cervical Pap smear 11/04/2017   Reflux esophagitis 04/17/2017   Tobacco abuse 04/17/2017   Peptic ulcer disease 04/13/2017   Erosive gastritis 04/11/2017   Anxiety 04/11/2017   Conditions to be addressed/monitored per PCP order:  Chronic healthcare management needs, tobacco use, h/o anxiety/depression, migraines, IBS  Care Plan : RN Care Manager Plan of Care  Updates made by Gayla Medicus, RN since 03/28/2022 12:00 AM     Problem: Care Coordination needs   Priority: High  Onset Date: 07/25/2021     Long-Range Goal: Establish Plan of Care and provide Care Coordination Needs   Start Date: 07/25/2021  Expected End Date: 06/28/2022  Priority: High  Note:   Current Barriers: Chronic Disease Management support and education needs 03/28/22:  Patient recently started on Zoloft-to follow up with PCP-declines SW referral.  Hands still shaking-has not followed up with neurology yet-to make an appt.  Continues to smoke 1ppd.  RNCM Clinical Goal(s):  Patient will verbalize understanding of plan for management of Anxiety, Depression, Tobacco Use, and back pain verbalize basic understanding of  Anxiety, Depression, Tobacco Use, and back pain take all medications exactly as prescribed and will call provider for medication related questions demonstrate understanding of rationale for each prescribed medication  attend all scheduled medical appointments:   continue to work with RN Care Manager to address care management and care coordination needs related to  Anxiety, Depression, Tobacco Use, and back pain work with pharmacist to address medications work with [employer] care guide to address needs related to  [employer] of Data processing manager for  dental and eye provider through collaboration with Consulting civil engineer, provider, and care team.  Patient will schedule an appointment with PCP and neurologist  Interventions: Inter-disciplinary care team collaboration (see longitudinal plan of care) Evaluation of current treatment plan related to  self management and patient's adherence to plan as established by provider Care guide/ BSW  referral for  eye provider 09/21/21-patient states she no longer needs dental provider information, still needs eye provider information-will refer-completed Collaboration with care guide and BSW  for resources Collaborated with pharmacy Pharmacy referral for medication review. Patient given 1-800-QUIT NOW number for resources.  SDOH Barriers (Status:  New goal. Patient interviewed and SDOH assessment performed        SDOH Interventions    Flowsheet Row Most Recent Value  SDOH Interventions   Food Insecurity Interventions Intervention Not Indicated  Housing Interventions Intervention Not Indicated  Transportation Interventions Intervention Not Indicated     Patient interviewed and appropriate assessments performed Referred patient to community resources care guide team for assistance with eye and dental reosurces Discussed plans with patient for ongoing care management follow up and provided patient with direct contact information for care management team  Patient Goals/Self-Care Activities: Patient will self administer medications as prescribed Patient will attend all scheduled provider appointments Patient will call pharmacy for medication refills Patient will continue to perform ADL's  independently Patient will continue to perform IADL's independently Patient will call provider office for new concerns or questions Patient will schedule an appointment with dental provider-completed Patient will schedule an appt with PCP and neurologist.  Follow Up Plan:  The care management team will reach out to the patient again over the next 30 business  days.    Long-Range Goal: Establish Plan of Care for Chronic Disease Management Needs   Priority: High  Note:   Timeframe:  Long-Range Goal Priority:  High Start Date:        12/17/21                     Expected End Date: ongoing                    Follow Up Date 05/02/22   - practice safe sex - schedule appointment for flu  shot - schedule appointment for vaccines needed due to my age or health - schedule recommended health tests (blood work, mammogram, colonoscopy, pap test) - schedule and keep appointment for annual check-up    Why is this important?   Screening tests can find diseases early when they are easier to treat.  Your doctor or nurse will talk with you about which tests are important for you.  Getting shots for common diseases like the flu and shingles will help prevent them.   03/28/22:  F/U appt with back surgeon 05/07/22-to schedule  with PCP and neuro   Follow Up:  Patient agrees to Care Plan and Follow-up.  Plan: The Managed Medicaid care management team will reach out to the patient again over the next 30 business days. and The  Patient has been provided with contact information for the Managed Medicaid care management team and has been advised to call with any health related questions or concerns.  Date/time of next scheduled RN care management/care coordination outreach:  05/02/22 at 1030.

## 2022-03-28 NOTE — Patient Instructions (Signed)
Hi Debbie Santana for speaking with me-have a great afternoon!  Debbie Santana was given information about Medicaid Managed Santana team Santana coordination services as a part of their Healthy [facility] Medicaid benefit. Debbie Santana verbally consented to engagement with the [facility] Managed Santana team.   If you are experiencing a medical emergency, please call 911 or report to your local emergency department or urgent Santana.   If you have a non-emergency medical problem during routine business hours, please contact your provider's office and ask to speak with a nurse.   For questions related to your Healthy [facility] health plan, please call: [phone] or visit the homepage here: [email or web address]  If you would like to schedule transportation through your Healthy [facility] plan, please call the following number at least 2 days in advance of your appointment: [phone]  For information about your ride after you set it up, call Ride Assist at [phone]. Use this number to activate a Will Call pickup, or if your transportation is late for a scheduled pickup. Use this number, too, if you need to make a change or cancel a previously scheduled reservation.  If you need transportation services right away, call [phone]. The after-hours call center is staffed 24 hours to handle ride assistance and urgent reservation requests (including discharges) 365 days a year. Urgent trips include sick visits, hospital discharge requests and life-sustaining treatment.  Call the [place] at [phone], at any time, 24 hours a day, 7 days a week. If you are in danger or need immediate medical attention call 911.  If you would like help to quit smoking, call 1-800-QUIT-NOW 769 795 8200) OR Espaol: 1-855-Djelo-Ya (3-295-188-4166) o para ms informacin haga clic aqu or Text READY to 200-400 to register via text  Debbie Santana - following are the goals  we discussed in your visit today:   Goals Addressed    Long-Range Goal: Establish Plan of Santana for Chronic Disease Management Needs   Priority: High  Note:   Timeframe:  Long-Range Goal Priority:  High Start Date:        12/17/21                     Expected End Date: ongoing                    Follow Up Date 05/02/22   - practice safe sex - schedule appointment for flu shot - schedule appointment for vaccines needed due to my age or health - schedule recommended health tests (blood work, mammogram, colonoscopy, pap test) - schedule and keep appointment for annual check-up    Why is this important?   Screening tests can find diseases early when they are easier to treat.  Your doctor or nurse will talk with you about which tests are important for you.  Getting shots for common diseases like the flu and shingles will help prevent them.   03/28/22:  F/U appt with back surgeon 05/07/22-to schedule  with PCP and neuro   Patient verbalizes understanding of instructions and Santana plan provided today and agrees to view in [place]. Active MyChart status and patient understanding of how to access instructions and Santana plan via MyChart confirmed with patient.     The Managed Medicaid Santana management team will reach out to the patient again over the next 30 business days.  The  Patient has been provided with contact information for the Managed Medicaid Santana management team and has been advised to  call with any health related questions or concerns.   Aida Raider RN, BSN [place] Management Coordinator - Managed Medicaid High Risk [phone]   Following is a copy of your plan of Santana:  Santana Plan : Debbie Santana  Updates made by Gayla Medicus, RN since 03/28/2022 12:00 AM     Problem: Santana Coordination needs   Priority: High  Onset Date: 07/25/2021     Long-Range Goal: Establish Plan of Santana and provide Santana Coordination Needs   Start Date:  07/25/2021  Expected End Date: 06/28/2022  Priority: High  Note:   Current Barriers: Chronic Disease Management support and education needs 03/28/22:  Patient recently started on Zoloft-to follow up with PCP-declines SW referral.  Hands still shaking-has not followed up with neurology yet-to make an appt.  Continues to smoke 1ppd.  RNCM Clinical Goal(s):  Patient will verbalize understanding of plan for management of Anxiety, Depression, Tobacco Use, and back pain verbalize basic understanding of  Anxiety, Depression, Tobacco Use, and back pain take all medications exactly as prescribed and will call provider for medication related questions demonstrate understanding of rationale for each prescribed medication  attend all scheduled medical appointments:  continue to work with RN Santana Manager to address Santana management and Santana coordination needs related to  Anxiety, Depression, Tobacco Use, and back pain work with pharmacist to address medications work with [employer] Santana guide to address needs related to  [employer] of Data processing manager for  dental and eye provider through collaboration with Consulting civil engineer, provider, and Santana team.  Patient will schedule an appointment with PCP and neurologist  Interventions: Inter-disciplinary Santana team collaboration (see longitudinal plan of Santana) Evaluation of current treatment plan related to  self management and patient's adherence to plan as established by provider Santana guide/ BSW  referral for  eye provider 09/21/21-patient states she no longer needs dental provider information, still needs eye provider information-will refer-completed Collaboration with Santana guide and BSW  for resources Collaborated with pharmacy Pharmacy referral for medication review. Patient given 1-800-QUIT NOW number for resources.  SDOH Barriers (Status:  New goal. Patient interviewed and SDOH assessment performed        SDOH Interventions    Flowsheet Row  Most Recent Value  SDOH Interventions   Food Insecurity Interventions Intervention Not Indicated  Housing Interventions Intervention Not Indicated  Transportation Interventions Intervention Not Indicated     Patient interviewed and appropriate assessments performed Referred patient to community resources Santana guide team for assistance with eye and dental reosurces Discussed plans with patient for ongoing Santana management follow up and provided patient with direct contact information for Santana management team  Patient Goals/Self-Santana Activities: Patient will self administer medications as prescribed Patient will attend all scheduled provider appointments Patient will call pharmacy for medication refills Patient will continue to perform ADL's independently Patient will continue to perform IADL's independently Patient will call provider office for new concerns or questions Patient will schedule an appointment with dental provider-completed Patient will schedule an appt with PCP and neurologist.  Follow Up Plan:  The Santana management team will reach out to the patient again over the next 30 business  days.

## 2022-04-12 ENCOUNTER — Encounter: Payer: Self-pay | Admitting: Adult Health

## 2022-04-12 ENCOUNTER — Ambulatory Visit: Payer: Medicaid Other | Admitting: Adult Health

## 2022-04-12 VITALS — BP 123/76 | HR 90 | Ht 69.0 in | Wt 198.5 lb

## 2022-04-12 DIAGNOSIS — L0292 Furuncle, unspecified: Secondary | ICD-10-CM | POA: Diagnosis not present

## 2022-04-12 MED ORDER — SULFAMETHOXAZOLE-TRIMETHOPRIM 800-160 MG PO TABS: 1.0000 | ORAL_TABLET | Freq: Two times a day (BID) | ORAL | 0 refills | Status: DC

## 2022-04-12 NOTE — Progress Notes (Signed)
  Subjective:     Patient ID: Debbie Santana, female   DOB: 1991/03/03, 31 y.o.   MRN: [id number]  HPI Debbie Santana is a 31 year old white female,single, G2P1011, in complaining of ? Cyst that is draining left labia, for few days.  Lab Results  Component Value Date   DIAGPAP (A) 09/05/2021    - Atypical squamous cells of undetermined significance (ASC-US)   HPVHIGH Negative 09/05/2021   PCP is Dr [name]  Review of Systems Has ?cyst left labia that is draining, has had for several days  Reviewed past medical,surgical, social and family history. Reviewed medications and allergies.     Objective:   Physical Exam BP 123/76 (BP Location: Left Arm, Patient Position: Sitting, Cuff Size: Normal)   Pulse 90   Ht '5\' 9"'$  (1.753 m)   Wt 198 lb 8 oz (90 kg)   LMP 03/26/2022 (Approximate)   BMI 29.31 kg/m     Skin warm and dry.Pelvic: external genitalia is normal in appearance, but has 1 cm boil left inner labia when pressed drains creamy fluid, and left labia slightly swollen.  [place] - 04/12/22 1104       Pregnancy Intention Screening   Does the patient want to become pregnant in the next year? No    Does the patient's partner want to become pregnant in the next year? No    Would the patient like to discuss contraceptive options today? No      Contraception Wrap Up   Current Method Hormonal Implant    End Method Hormonal Implant            Examination chaperoned by Levy Pupa LPN  Assessment:     1. Boil Has had for several days Draining pus now Will rx septra ds  Meds ordered this encounter  Medications   sulfamethoxazole-trimethoprim (BACTRIM DS) 800-160 MG tablet    Sig: Take 1 tablet by mouth 2 (two) times daily. Take 1 bid    Dispense:  28 tablet    Refill:  0    Order Specific Question:   Supervising Provider    Answer:   Tania Ade H [[id number]]    Use warm compress Do not squeeze Review handout on boils     Plan:     Return in about 3 week for recheck and pap

## 2022-05-01 DIAGNOSIS — F330 Major depressive disorder, recurrent, mild: Secondary | ICD-10-CM | POA: Diagnosis not present

## 2022-05-02 ENCOUNTER — Other Ambulatory Visit: Payer: Self-pay | Admitting: Obstetrics and Gynecology

## 2022-05-02 NOTE — Patient Instructions (Signed)
Hey Debbie Santana for speaking with me, I hope you have a nice day!!  Debbie Santana was given information about Medicaid Managed Care team care coordination services as a part of their Healthy [facility] Medicaid benefit. Debbie Santana verbally consented to engagement with the [facility] Managed Care team.   If you are experiencing a medical emergency, please call 911 or report to your local emergency department or urgent care.   If you have a non-emergency medical problem during routine business hours, please contact your provider's office and ask to speak with a nurse.   For questions related to your Healthy [facility] health plan, please call: [phone] or visit the homepage here: [email or web address]  If you would like to schedule transportation through your Healthy [facility] plan, please call the following number at least 2 days in advance of your appointment: [phone]  For information about your ride after you set it up, call Ride Assist at [phone]. Use this number to activate a Will Call pickup, or if your transportation is late for a scheduled pickup. Use this number, too, if you need to make a change or cancel a previously scheduled reservation.  If you need transportation services right away, call [phone]. The after-hours call center is staffed 24 hours to handle ride assistance and urgent reservation requests (including discharges) 365 days a year. Urgent trips include sick visits, hospital discharge requests and life-sustaining treatment.  Call the [place] at [phone], at any time, 24 hours a day, 7 days a week. If you are in danger or need immediate medical attention call 911.  If you would like help to quit smoking, call 1-800-QUIT-NOW (867)033-9116) OR Espaol: 1-855-Djelo-Ya (6-237-628-3151) o para ms informacin haga clic aqu or Text READY to 200-400 to register via text  Debbie Santana - following are  the goals we discussed in your visit today:   Goals Addressed    Timeframe:  Long-Range Goal Priority:  High Start Date:        12/17/21                     Expected End Date: ongoing                    Follow Up Date 06/11/22   - practice safe sex - schedule appointment for flu shot - schedule appointment for vaccines needed due to my age or health - schedule recommended health tests (blood work, mammogram, colonoscopy, pap test) - schedule and keep appointment for annual check-up    Why is this important?   Screening tests can find diseases early when they are easier to treat.  Your doctor or nurse will talk with you about which tests are important for you.  Getting shots for common diseases like the flu and shingles will help prevent them.   05/02/22:  Patient to schedule an appt with neuro.  Patient verbalizes understanding of instructions and care plan provided today and agrees to view in [place]. Active MyChart status and patient understanding of how to access instructions and care plan via MyChart confirmed with patient.     The Managed Medicaid care management team will reach out to the patient again over the next 30 business  days.  The  Patient  has been provided with contact information for the Managed Medicaid care management team and has been advised to call with any health related questions or concerns.   Aida Raider RN, BSN [employer]  Network Care Management Coordinator - Managed Medicaid High Risk [phone]   Following is a copy of your plan of care:  Care Plan : [place] of Care  Updates made by Gayla Medicus, RN since 05/02/2022 12:00 AM     Problem: Care Coordination needs   Priority: High  Onset Date: 07/25/2021     Long-Range Goal: Establish Plan of Care and provide Care Coordination Needs   Start Date: 07/25/2021  Expected End Date: 06/28/2022  Priority: High  Note:   Current Barriers: Chronic Disease Management  support and education needs 05/02/22:  Patient to schedule an appt with neurologist as headaches have increased and hands are still shaking.  Zoloft increased to 150 mg, patient declines SW.  Has 8/15 f/u with back surgeon.  Has received top dentures.  Continues to smoke 1ppd-has resources.  RNCM Clinical Goal(s):  Patient will verbalize understanding of plan for management of Anxiety, Depression, Tobacco Use, and back pain verbalize basic understanding of  Anxiety, Depression, Tobacco Use, and back pain take all medications exactly as prescribed and will call provider for medication related questions demonstrate understanding of rationale for each prescribed medication  attend all scheduled medical appointments:  continue to work with RN Care Manager to address care management and care coordination needs related to  Anxiety, Depression, Tobacco Use, and back pain work with pharmacist to address medications work with [employer] care guide to address needs related to  [employer] of Data processing manager for  dental and eye provider through collaboration with Consulting civil engineer, provider, and care team.  Patient will schedule an appointment with PCP and neurologist  Interventions: Inter-disciplinary care team collaboration (see longitudinal plan of care) Evaluation of current treatment plan related to  self management and patient's adherence to plan as established by provider Care guide/ BSW  referral for  eye provider 09/21/21-patient states she no longer needs dental provider information, still needs eye provider information-will refer-completed Collaboration with care guide and BSW  for resources Collaborated with pharmacy Pharmacy referral for medication review. Patient given 1-800-QUIT NOW number for resources.  SDOH Barriers (Status:  New goal. Patient interviewed and SDOH assessment performed        SDOH Interventions    Flowsheet Row Most Recent Value  SDOH Interventions    Food Insecurity Interventions Intervention Not Indicated  Housing Interventions Intervention Not Indicated  Transportation Interventions Intervention Not Indicated     Patient interviewed and appropriate assessments performed Referred patient to community resources care guide team for assistance with eye and dental reosurces Discussed plans with patient for ongoing care management follow up and provided patient with direct contact information for care management team  Patient Goals/Self-Care Activities: Patient will self administer medications as prescribed Patient will attend all scheduled provider appointments Patient will call pharmacy for medication refills Patient will continue to perform ADL's independently Patient will continue to perform IADL's independently Patient will call provider office for new concerns or questions Patient will schedule an appointment with dental provider-completed Patient will schedule an appt with PCP and neurologist.  Follow Up Plan:  The care management team will reach out to the patient again over the next 30 business  days.

## 2022-05-02 NOTE — Patient Outreach (Signed)
Medicaid Managed Care   Nurse Care Manager Note  05/02/2022 Name:  Debbie Santana MRN:  [id number] DOB:  16-Nov-1990  Debbie Santana is an 31 y.o. year old female who is a primary patient of Debbie Andrea, MD.  The [facility] Managed Care Coordination team was consulted for assistance with:    Chronic healthcare management needs, tobacco use, migraines, IBS, h/o anxiety/depression  [name] was given information about Medicaid Managed Care Coordination team services today. Debbie Santana Patient agreed to services and verbal consent obtained.  Engaged with patient by telephone for follow up visit in response to provider referral for case management and/or care coordination services.   Assessments/Interventions:  Review of past medical history, allergies, medications, health status, including review of consultants reports, laboratory and other test data, was performed as part of comprehensive evaluation and provision of chronic care management services.  SDOH (Social Determinants of Health) assessments and interventions performed: SDOH Interventions    Flowsheet Row Most Recent Value  SDOH Interventions   Food Insecurity Interventions Intervention Not Indicated  Financial Strain Interventions Intervention Not Indicated      Care Plan  Allergies  Allergen Reactions   Nsaids Other (See Comments)    History of ulcers   Medications Reviewed Today     Reviewed by Debbie Medicus, RN (Registered Nurse) on 05/02/22 at 1042  Med List Status: <None>   Medication Order Taking? Sig Documenting Provider Last Dose Status Informant  albuterol (VENTOLIN HFA) 108 (90 Base) MCG/ACT inhaler [id number] No Inhale 2 puffs into the lungs every 6 (six) hours as needed for wheezing or shortness of breath. Debbie Eva, MD Taking Active Self  butalbital-acetaminophen-caffeine (FIORICET) [phone] MG tablet [id number] No TAKE 1 TABLET BY MOUTH EVERY 6 HOURS AS NEEDED FOR MIGRAINE HEADACHE Debbie Dooms,  NP Taking Active Self  diphenhydrAMINE (BENADRYL) 25 MG tablet [id number] No Take 25 mg by mouth every 6 (six) hours as needed for allergies. [provider] Taking Active Self  etonogestrel (NEXPLANON) 68 MG IMPL implant [id number] No 68 mg by Subdermal route once. [provider] Taking Active Self           Med Note Debbie Santana   Tue Nov 14, 2020  4:15 PM)    LORazepam (ATIVAN) 1 MG tablet [id number] No Take 1 mg by mouth 4 (four) times daily as needed for anxiety. [provider] Taking Active Self  omeprazole (PRILOSEC) 40 MG capsule [id number] No Take 1 capsule (40 mg total) by mouth 2 (two) times daily. Debbie Rung, NP Taking Active Self  ondansetron (ZOFRAN) 8 MG tablet [id number] No Take 8 mg by mouth every 8 (eight) hours as needed for nausea or vomiting. [provider] Taking Active Self  sertraline (ZOLOFT) 50 MG tablet [id number] No Take 50 mg by mouth daily. Takes 1 and 1/2 tablet a day [provider] Taking Active Self           Med Note (Debbie Santana, Debbie Santana   Thu May 02, 2022 10:35 AM) Taking 150 mg a day  sucralfate (CARAFATE) 1 g tablet [id number] No Take 1 tablet (1 g total) by mouth 3 (three) times daily before meals. Debbie Rung, NP Taking Active Self  sulfamethoxazole-trimethoprim (BACTRIM DS) 800-160 MG tablet [id number]  Take 1 tablet by mouth 2 (two) times daily. Take 1 bid Debbie Dooms, NP  Active            Patient Active Problem List  Diagnosis Date Noted   Boil 04/12/2022   Positive H. pylori test 12/04/2021   Drug-induced constipation 11/06/2021   Rectal bleeding 11/06/2021   Atypical squamous cells of undetermined significance on cytologic smear of cervix (ASC-US) 10/31/2020   High grade squamous intraepithelial cervical dysplasia, S/P cervical conization 10/31/2020   IBS (irritable bowel syndrome) 10/30/2020   Encounter for support and coordination of transition of care 09/14/2020   Cannabis  hyperemesis syndrome concurrent with and due to cannabis abuse ([place]) 09/09/2020   Chronic abdominal pain    Chronic radicular lumbar pain 08/23/2020   Chronic pain syndrome 08/23/2020   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) 08/23/2020   Post laminectomy syndrome 08/23/2020   Frequent headaches 08/10/2020   Nexplanon in place 08/10/2020   Irregular intermenstrual bleeding 08/10/2020   Obesity (BMI 30-39.9) 05/20/2020   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   Panic attack 05/18/2020   Abdominal pain, chronic, epigastric 05/18/2020   Agitated 05/18/2020   Gastroesophageal reflux disease 05/10/2020   Alternating constipation and diarrhea 05/10/2020   Bilateral wheezing 02/22/2020   Sleep disturbance 02/22/2020   Anxiety and depression 02/19/2019   Intractable nausea and vomiting 12/07/2018   Low back pain 12/07/2018   Chronic diarrhea 12/07/2018   Condyloma 04/15/2018   History of abnormal cervical Pap smear 11/04/2017   Reflux esophagitis 04/17/2017   Tobacco abuse 04/17/2017   Peptic ulcer disease 04/13/2017   Erosive gastritis 04/11/2017   Anxiety 04/11/2017   Conditions to be addressed/monitored per PCP order:  Chronic healthcare management needs, tobacco use, migraines, IBS, h/o anxiety/depression  Care Plan : RN Care Manager Plan of Care  Updates made by Debbie Medicus, RN since 05/02/2022 12:00 AM     Problem: Care Coordination needs   Priority: High  Onset Date: 07/25/2021     Long-Range Goal: Establish Plan of Care and provide Care Coordination Needs   Start Date: 07/25/2021  Expected End Date: 06/28/2022  Priority: High  Note:   Current Barriers: Chronic Disease Management support and education needs 05/02/22:  Patient to schedule an appt with neurologist as headaches have increased and hands are still shaking.  Zoloft increased to 150 mg, patient declines SW.  Has 8/15 f/u with back surgeon.  Has received top dentures.  Continues to smoke 1ppd-has  resources.  RNCM Clinical Goal(s):  Patient will verbalize understanding of plan for management of Anxiety, Depression, Tobacco Use, and back pain verbalize basic understanding of  Anxiety, Depression, Tobacco Use, and back pain take all medications exactly as prescribed and will call provider for medication related questions demonstrate understanding of rationale for each prescribed medication  attend all scheduled medical appointments:  continue to work with RN Care Manager to address care management and care coordination needs related to  Anxiety, Depression, Tobacco Use, and back pain work with pharmacist to address medications work with [employer] care guide to address needs related to  [employer] of Data processing manager for  dental and eye provider through collaboration with Consulting civil engineer, provider, and care team.  Patient will schedule an appointment with PCP and neurologist  Interventions: Inter-disciplinary care team collaboration (see longitudinal plan of care) Evaluation of current treatment plan related to  self management and patient's adherence to plan as established by provider Care guide/ BSW  referral for  eye provider 09/21/21-patient states she no longer needs dental provider information, still needs eye provider information-will refer-completed Collaboration with care guide and BSW  for resources Collaborated with pharmacy Pharmacy referral for  medication review. Patient given 1-800-QUIT NOW number for resources.  SDOH Barriers (Status:  New goal. Patient interviewed and SDOH assessment performed        SDOH Interventions    Flowsheet Row Most Recent Value  SDOH Interventions   Food Insecurity Interventions Intervention Not Indicated  Housing Interventions Intervention Not Indicated  Transportation Interventions Intervention Not Indicated     Patient interviewed and appropriate assessments performed Referred patient to community resources care  guide team for assistance with eye and dental reosurces Discussed plans with patient for ongoing care management follow up and provided patient with direct contact information for care management team  Patient Goals/Self-Care Activities: Patient will self administer medications as prescribed Patient will attend all scheduled provider appointments Patient will call pharmacy for medication refills Patient will continue to perform ADL's independently Patient will continue to perform IADL's independently Patient will call provider office for new concerns or questions Patient will schedule an appointment with dental provider-completed Patient will schedule an appt with PCP and neurologist.  Follow Up Plan:  The care management team will reach out to the patient again over the next 30 business  days.   Long-Range Goal: Establish Plan of Care for Chronic Disease Management Needs   Priority: High  Note:   Timeframe:  Long-Range Goal Priority:  High Start Date:        12/17/21                     Expected End Date: ongoing                    Follow Up Date 06/11/22   - practice safe sex - schedule appointment for flu shot - schedule appointment for vaccines needed due to my age or health - schedule recommended health tests (blood work, mammogram, colonoscopy, pap test) - schedule and keep appointment for annual check-up    Why is this important?   Screening tests can find diseases early when they are easier to treat.  Your doctor or nurse will talk with you about which tests are important for you.  Getting shots for common diseases like the flu and shingles will help prevent them.   05/02/22:  Patient to schedule an appt with neuro.   Follow Up:  Patient agrees to Care Plan and Follow-up.  Plan: The Managed Medicaid care management team will reach out to the patient again over the next 30 business  days. and The  Patient has been provided with contact information for the Managed Medicaid  care management team and has been advised to call with any health related questions or concerns.  Date/time of next scheduled RN care management/care coordination outreach: 06/11/22 at 1030.

## 2022-05-03 ENCOUNTER — Ambulatory Visit: Payer: Medicaid Other | Admitting: Adult Health

## 2022-05-06 ENCOUNTER — Ambulatory Visit (INDEPENDENT_AMBULATORY_CARE_PROVIDER_SITE_OTHER): Payer: Medicaid Other | Admitting: Gastroenterology

## 2022-05-07 ENCOUNTER — Encounter (INDEPENDENT_AMBULATORY_CARE_PROVIDER_SITE_OTHER): Payer: Self-pay | Admitting: Gastroenterology

## 2022-05-07 ENCOUNTER — Ambulatory Visit (INDEPENDENT_AMBULATORY_CARE_PROVIDER_SITE_OTHER): Payer: Medicaid Other | Admitting: Gastroenterology

## 2022-05-07 ENCOUNTER — Encounter (INDEPENDENT_AMBULATORY_CARE_PROVIDER_SITE_OTHER): Payer: Self-pay

## 2022-05-07 VITALS — BP 126/79 | HR 101 | Temp 98.2°F | Ht 69.0 in | Wt 191.6 lb

## 2022-05-07 DIAGNOSIS — G8929 Other chronic pain: Secondary | ICD-10-CM

## 2022-05-07 DIAGNOSIS — K2970 Gastritis, unspecified, without bleeding: Secondary | ICD-10-CM | POA: Diagnosis not present

## 2022-05-07 DIAGNOSIS — K219 Gastro-esophageal reflux disease without esophagitis: Secondary | ICD-10-CM | POA: Diagnosis not present

## 2022-05-07 DIAGNOSIS — K3189 Other diseases of stomach and duodenum: Secondary | ICD-10-CM | POA: Diagnosis not present

## 2022-05-07 DIAGNOSIS — R1013 Epigastric pain: Secondary | ICD-10-CM | POA: Diagnosis not present

## 2022-05-07 DIAGNOSIS — K5900 Constipation, unspecified: Secondary | ICD-10-CM

## 2022-05-07 DIAGNOSIS — B9681 Helicobacter pylori [H. pylori] as the cause of diseases classified elsewhere: Secondary | ICD-10-CM

## 2022-05-07 MED ORDER — HYOSCYAMINE SULFATE 0.125 MG SL SUBL: 0.1250 mg | SUBLINGUAL_TABLET | Freq: Four times a day (QID) | SUBLINGUAL | 1 refills | Status: DC | PRN

## 2022-05-07 NOTE — Progress Notes (Signed)
Referring Provider: Leslie Andrea, MD Primary Care Physician:  Leslie Andrea, MD Primary GI Physician: Jenetta Downer  Chief Complaint  Patient presents with   Gastroesophageal Reflux    6 month follow up on GERD and constipation. Having epi gastric pain, nausea and no appetite. Was given samples of linzess at last visit. Reports it helped and does not take anything currently for constipation. Reports she has diarrhea at times and then constipation at times.    HPI:   GABBI WHETSTONE is a 31 y.o. female with past medical history of  anxiety, depression, GERD, PUD, erosive esophagitis.  Patient presenting today for follow up of GERD and constipation.  History: Last seen February 2023, at that time she c/o mid upper abdominal pain for the past few months, pain improved with eating. Denied GERd symptoms. On omeprazole '40mg'$  BID and carafate 1g 2-3x/day which seems to help symptoms. Also with constipation, very hard stools, on vicodin Q6h for back pain. Having a BM 2-3x/week. Continued on PPI BID and carafate TID, linzess 59mg samples given, scheduled for EGD, findings as outlined below,started on levsin QID PRN thereafter.   Present:   Constipation:constipation is somewhat improved, she is unsure how many times per week she is having a BM. She did note that Linzess samples given at last visit helped. She felt that she was going more regularly on that medication. Not currently on anything for this. Denies needing to strain, no rectal bleeding or melena. She is no longer on Vicodin. Water intake is not good.   GERD: states that she continues to have epigastric/upper abdominal pain, daily, usually has the pain as soon as she wakes up. Eating does not make the pain worse, appetite is not good though. She endorses nausea daily with some occasional vomiting.  Uses zofran for this. She is still taking PPI and carafate 1g 2-3 x/day. Did not get levsin prescribed after EGD as insurance would not cover  this. Denies GERD symptoms. She does take midol maybe every other day.    Notably H pylori stain was positive on EGD in March, patient treated with pylera and supposed to have repeat h pylori testing after completion of therapy but has not had this, CT A/P with contrast also ordered by Dr. RLaural Goldenthereafter but pt was also lost to follow up for this.   Last EGD 11/21/21 duodenal bulb and second portion of the duodenum were normal. Outpatient - Normal hypopharynx. - Normal esophagus. - Z-line irregular, 37 cm from the incisors. - Erosive gastropathy with no stigmata of recent bleeding. Biopsied. ( H pylori +, treated with Pylera) - Normal duodenal bulb and second portion of the duodenum. Last Colonoscopy: 08/29/2020  - normal TI, normal colon neg for microscopic colitis, 2 mm polyp in sigmoid (lymphoid aggregate) and 8 mm polyp in rectum (condyloma accuminatum).  Past Medical History:  Diagnosis Date   Anxiety    Bradycardia 04/17/2017   Chest pain on breathing 02/22/2020   Chronic back pain    Depression    Erosive gastritis 04/11/2017   Gastric ulcer    Genital warts    GERD (gastroesophageal reflux disease)    History of gestational hypertension 05/21/2018   Dx intrapartum   History of kidney stones    Intertrigo 11/16/2019   Intractable abdominal pain 04/11/2017   Intractable vomiting 05/20/2020   Irregular intermenstrual bleeding 02/19/2019   Mental disorder    Migraine without aura and without status migrainosus, not intractable 02/19/2019   Nausea 05/10/2020  Nausea & vomiting 12/07/2018   Nexplanon in place 02/19/2019   Obesity 04/17/2017   Panic disorder    Peptic ulcer disease 04/13/2017   Pinched nerve 09/24/2019   Postpartum hypertension 06/23/2018   requiring norvasc, resolved at pp visit   Sciatica    Tremor of both hands    Vaginal Pap smear, abnormal    Wheezing     Past Surgical History:  Procedure Laterality Date   BACK SURGERY     ruptured disc.    BIOPSY  04/14/2017   Procedure: BIOPSY;  Surgeon: Rogene Houston, MD;  [facility];  Service: Endoscopy;;  gastric   BIOPSY  05/22/2020   Procedure: BIOPSY;  Surgeon: Daneil Dolin, MD;  [facility];  Service: Endoscopy;;   BIOPSY  08/29/2020   Procedure: BIOPSY;  Surgeon: Harvel Quale, MD;  [facility];  Service: Gastroenterology;;   BIOPSY  09/09/2020   Procedure: BIOPSY;  Surgeon: Eloise Harman, DO;  [facility];  Service: Endoscopy;;  duodenum;antral   BIOPSY  11/21/2021   Procedure: BIOPSY;  Surgeon: Rogene Houston, MD;  [facility];  Service: Endoscopy;;   CERVICAL ABLATION N/A 12/02/2018   Procedure: LASER ABLATION OF CERVIX;  Surgeon: Florian Buff, MD;  [facility]: [facility];  Service: Gynecology;  Laterality: N/A;   CHOLECYSTECTOMY N/A 04/17/2017   Procedure: LAPAROSCOPIC CHOLECYSTECTOMY;  Surgeon: Aviva Signs, MD;  Location: [facility];  Service: General;  Laterality: N/A;   COLONOSCOPY WITH PROPOFOL N/A 08/29/2020   Procedure: COLONOSCOPY WITH PROPOFOL;  Surgeon: Harvel Quale, MD;  [facility];  Service: Gastroenterology;  Laterality: N/A;  11:15   COLPOSCOPY     ESOPHAGOGASTRODUODENOSCOPY (EGD) WITH PROPOFOL N/A 04/14/2017   Procedure: ESOPHAGOGASTRODUODENOSCOPY (EGD) WITH PROPOFOL;  Surgeon: Rogene Houston, MD;  [facility];  Service: Endoscopy;  Laterality: N/A;   ESOPHAGOGASTRODUODENOSCOPY (EGD) WITH PROPOFOL N/A 05/22/2020   Procedure: ESOPHAGOGASTRODUODENOSCOPY (EGD) WITH PROPOFOL;  Surgeon: Daneil Dolin, MD;  [facility];  Service: Endoscopy;  Laterality: N/A;   ESOPHAGOGASTRODUODENOSCOPY (EGD) WITH PROPOFOL N/A 09/09/2020   Procedure: ESOPHAGOGASTRODUODENOSCOPY (EGD) WITH PROPOFOL;  Surgeon: Eloise Harman, DO;  [facility];  Service: Endoscopy;  Laterality: N/A;   ESOPHAGOGASTRODUODENOSCOPY (EGD) WITH PROPOFOL N/A 11/21/2020    Procedure: ESOPHAGOGASTRODUODENOSCOPY (EGD) WITH PROPOFOL;  Surgeon: Harvel Quale, MD;  [facility];  Service: Gastroenterology;  Laterality: N/A;  Am   ESOPHAGOGASTRODUODENOSCOPY (EGD) WITH PROPOFOL N/A 11/21/2021   Procedure: ESOPHAGOGASTRODUODENOSCOPY (EGD) WITH PROPOFOL;  Surgeon: Rogene Houston, MD;  [facility];  Service: Endoscopy;  Laterality: N/A;  820   HERNIA REPAIR Bilateral    inguinal   LASER ABLATION CONDOLAMATA N/A 12/02/2018   Procedure: LASER ABLATION CONDYLOMA ACCUMINATA LEFT AND RIGHT VULVA, PERINEUM AND PERIANAL (15 TOTAL);  Surgeon: Florian Buff, MD;  Location: [facility];  Service: Gynecology;  Laterality: N/A;   LUMBAR LAMINECTOMY/DECOMPRESSION MICRODISCECTOMY Right 12/03/2021   Procedure: MICRODISCECTOMY Lumbar four- five;  Surgeon: Newman Pies, MD;  Location: [facility];  Service: Neurosurgery;  Laterality: Right;   MOUTH SURGERY  09/2021   All top teeth and all wisdom teeth removed   POLYPECTOMY  08/29/2020   Procedure: POLYPECTOMY;  Surgeon: Harvel Quale, MD;  [facility];  Service: Gastroenterology;;    Current Outpatient Medications  Medication Sig Dispense Refill   albuterol (VENTOLIN HFA) 108 (90 Base) MCG/ACT inhaler Inhale 2  puffs into the lungs every 6 (six) hours as needed for wheezing or shortness of breath. 1 each 0   butalbital-acetaminophen-caffeine (FIORICET) 50-325-40 MG tablet TAKE 1 TABLET BY MOUTH EVERY 6 HOURS AS NEEDED FOR MIGRAINE HEADACHE 20 tablet 0   diphenhydrAMINE (BENADRYL) 25 MG tablet Take 25 mg by mouth every 6 (six) hours as needed for allergies.     etonogestrel (NEXPLANON) 68 MG IMPL implant 68 mg by Subdermal route once.     LORazepam (ATIVAN) 1 MG tablet Take 1 mg by mouth 4 (four) times daily as needed for anxiety.     omeprazole (PRILOSEC) 40 MG capsule Take 1 capsule (40 mg total) by mouth 2 (two) times daily. 120 capsule 3   ondansetron (ZOFRAN) 8 MG tablet Take 8 mg by  mouth every 8 (eight) hours as needed for nausea or vomiting.     sertraline (ZOLOFT) 50 MG tablet Take 50 mg by mouth daily. Takes 1 and 1/2 tablet a day     sucralfate (CARAFATE) 1 g tablet Take 1 tablet (1 g total) by mouth 3 (three) times daily before meals. 90 tablet 3   No current facility-administered medications for this visit.    Allergies as of 05/07/2022 - Review Complete 05/07/2022  Allergen Reaction Noted   Nsaids Other (See Comments) 08/22/2020    Family History  Problem Relation Age of Onset   Anxiety disorder Mother    Hyperlipidemia Mother    Crohn's disease Sister    Diabetes Maternal Grandfather    Diabetes Cousin    Learning disabilities Cousin     Social History   Socioeconomic History   Marital status: Single    Spouse name: Not on file   Number of children: 1   Years of education: Not on file   Highest education level: Not on file  Occupational History   Not on file  Tobacco Use   Smoking status: Every Day    Packs/day: 1.00    Years: 10.00    Total pack years: 10.00    Types: Cigarettes    Passive exposure: Current   Smokeless tobacco: Never  Vaping Use   Vaping Use: Former  Substance and Sexual Activity   Alcohol use: No   Drug use: No   Sexual activity: Yes    Birth control/protection: Implant  Other Topics Concern   Not on file  Social History Narrative   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      3 dogs and 3 cats      Enjoy: crafts      Diet: eats all food groups -acidic foods   Caffeine: stopped in last week or so   Water: 6-8 cups daily       Wears seat belt   Does not drive   Smoke detectors at home   Fire extinguisher    No Weapons    Social Determinants of Health   Financial Resource Strain: Low Risk  (05/02/2022)   Overall Financial Resource Strain (CARDIA)    Difficulty of Paying Living Expenses: Not very hard  Food Insecurity: No Food Insecurity (05/02/2022)   Hunger  Vital Sign    Worried About Running Out of Food in the Last Year: Never true    Ran Out of Food in the Last Year: Never true  Transportation Needs: No Transportation Needs (12/19/2021)   PRAPARE - Hydrologist (Medical): No  Lack of Transportation (Non-Medical): No  Physical Activity: Sufficiently Active (11/05/2021)   Exercise Vital Sign    Days of Exercise per Week: 7 days    Minutes of Exercise per Session: 100 min  Stress: No Stress Concern Present (03/28/2022)   [place]    Feeling of Stress : Only a little  Social Connections: Socially Isolated (11/05/2021)   Social Connection and Isolation Panel [NHANES]    Frequency of Communication with Friends and Family: More than three times a week    Frequency of Social Gatherings with Friends and Family: More than three times a week    Attends Religious Services: Never    Marine scientist or Organizations: No    Attends Music therapist: Never    Marital Status: Never married   Review of systems General: negative for malaise, night sweats, fever, chills, weight loss Neck: Negative for lumps, goiter, pain and significant neck swelling Resp: Negative for cough, wheezing, dyspnea at rest CV: Negative for chest pain, leg swelling, palpitations, orthopnea GI: denies melena, hematochezia,  vomiting, diarrhea, constipation, dysphagia, odyonophagia, early satiety or unintentional weight loss. +epigastric pain +nausea MSK: Negative for joint pain or swelling, back pain, and muscle pain. Derm: Negative for itching or rash Psych: Denies depression, anxiety, memory loss, confusion. No homicidal or suicidal ideation.  Heme: Negative for prolonged bleeding, bruising easily, and swollen nodes. Endocrine: Negative for cold or heat intolerance, polyuria, polydipsia and goiter. Neuro: negative for tremor, gait imbalance, syncope and  seizures. The remainder of the review of systems is noncontributory.  Physical Exam: BP 126/79 (BP Location: Left Arm, Patient Position: Sitting, Cuff Size: Large)   Pulse (!) 101   Temp 98.2 F (36.8 C) (Oral)   Ht '5\' 9"'$  (1.753 m)   Wt 191 lb 9.6 oz (86.9 kg)   LMP 03/26/2022 (Approximate)   BMI 28.29 kg/m  General:   Alert and oriented. No distress noted. Pleasant and cooperative.  Head:  Normocephalic and atraumatic. Eyes:  Conjuctiva clear without scleral icterus. Mouth:  Oral mucosa pink and moist. Good dentition. No lesions. Heart: Normal rate and rhythm, s1 and s2 heart sounds present.  Lungs: Clear lung sounds in all lobes. Respirations equal and unlabored. Abdomen:  +BS, soft, and non-distended. TTP of LUQ. No rebound or guarding. No HSM or masses noted. Derm: No palmar erythema or jaundice Msk:  Symmetrical without gross deformities. Normal posture. Extremities:  Without edema. Neurologic:  Alert and  oriented x4 Psych:  Alert and cooperative. Normal mood and affect.  Invalid input(s): "6 MONTHS"   ASSESSMENT: HAJAR PENNINGER is a 31 y.o. female presenting today for follow up of constipation and epigastric pain.  Constipation improved since stopping vicodin though she cannot tell me how often she has a BM. Denies rectal bleeding or melena. Had good results with linzess 6mg samples at last OV, however, she does not feel that she needs anything for constipation at this time. Should try to increase water intake. She will make me aware if she requires Rx for linzess.   Chronic epigastric pain. EGD done in march with erosive gastropathy and H pylori gastritis,treated with Pylera though no follow up H pylori testing done, also with previous CT A/P with contrast ordered by Dr. RLaural Goldengiven biopsy  revealed gastropathy with ulceration, however, appeared to be erosions endoscopically. Patient did not complete CT imaging. Will hold PPI x2 weeks and complete H pylori breath test to  determine eradication as well as proceed with CT imaging. Pt to restart PPI BID after breath test completed. Can continue carafate TID. Will also resend Levsin and provide GoodRx card.    PLAN:  Continue PPI BID  2. Continue carafate 1g TID  3. H pylori breath test, hold PPI x2 weeks prior to this.   4. Rx Levsin, good rx 5. Schedule CT A/P with contrast   All questions were answered, patient verbalized understanding and is in agreement with plan as outlined above.    Follow Up: 6 months  Cambren Helm L. Alver Sorrow, MSN, APRN, AGNP-C Adult-Gerontology Nurse Practitioner [facility]

## 2022-05-07 NOTE — Patient Instructions (Addendum)
Please hold your omeprazole x 2 weeks, after 2 weeks off this you can go to the lab for H pylori breath testing to confirm eradication of the bacteria, please resume your omeprazole after you complete testing   We will get you scheduled for CT scan of the abdomen as previously recommended by Dr. Laural Golden  Please avoid NSAIDs (advil, aleve, naproxen, goody powder, ibuprofen) as these can be very hard on your GI tract, causing inflammation, ulcers and damage to the lining of your GI tract.   I have sent levsin for you to take for abdominal pain, every 6 hours as needed, we are providing a GoodRx card which should help with the cost.  Follow up 6 months

## 2022-05-29 DIAGNOSIS — A048 Other specified bacterial intestinal infections: Secondary | ICD-10-CM | POA: Diagnosis not present

## 2022-05-29 DIAGNOSIS — M5136 Other intervertebral disc degeneration, lumbar region: Secondary | ICD-10-CM | POA: Diagnosis not present

## 2022-05-29 DIAGNOSIS — M5451 Vertebrogenic low back pain: Secondary | ICD-10-CM | POA: Diagnosis not present

## 2022-05-29 DIAGNOSIS — F331 Major depressive disorder, recurrent, moderate: Secondary | ICD-10-CM | POA: Diagnosis not present

## 2022-06-10 ENCOUNTER — Other Ambulatory Visit (INDEPENDENT_AMBULATORY_CARE_PROVIDER_SITE_OTHER): Payer: Medicaid Other | Admitting: *Deleted

## 2022-06-10 ENCOUNTER — Other Ambulatory Visit (HOSPITAL_COMMUNITY)
Admission: RE | Admit: 2022-06-10 | Discharge: 2022-06-10 | Disposition: A | Discharge status: Home / Self Care | Payer: Medicaid Other | Source: Ambulatory Visit | Attending: Obstetrics & Gynecology | Admitting: Obstetrics & Gynecology

## 2022-06-10 DIAGNOSIS — N898 Other specified noninflammatory disorders of vagina: Secondary | ICD-10-CM | POA: Insufficient documentation

## 2022-06-10 DIAGNOSIS — K2970 Gastritis, unspecified, without bleeding: Secondary | ICD-10-CM | POA: Diagnosis not present

## 2022-06-10 DIAGNOSIS — B9681 Helicobacter pylori [H. pylori] as the cause of diseases classified elsewhere: Secondary | ICD-10-CM | POA: Diagnosis not present

## 2022-06-10 NOTE — Progress Notes (Signed)
   NURSE VISIT- VAGINITIS/STD  SUBJECTIVE:  Debbie Santana is a 31 y.o. G2P1011 GYN patientfemale here for a vaginal swab for vaginitis screening, STD screen.  She reports the following symptoms:  vaginal itching, irritation and discharge  for 1 month. Denies abnormal vaginal bleeding, significant pelvic pain, fever, or UTI symptoms.  OBJECTIVE:  There were no vitals taken for this visit.  Appears well, in no apparent distress  ASSESSMENT: Vaginal swab for vaginitis screening & STD screening.  PLAN: Self-collected vaginal probe for Gonorrhea, Chlamydia, Trichomonas, Bacterial Vaginosis, Yeast sent to lab Treatment: to be determined once results are received Follow-up as needed if symptoms persist/worsen, or new symptoms develop  Levy Pupa  06/10/2022 10:59 AM

## 2022-06-11 ENCOUNTER — Other Ambulatory Visit: Payer: Self-pay | Admitting: Obstetrics and Gynecology

## 2022-06-11 ENCOUNTER — Other Ambulatory Visit: Payer: Self-pay | Admitting: Adult Health

## 2022-06-11 LAB — CERVICOVAGINAL ANCILLARY ONLY
Bacterial Vaginitis (gardnerella): POSITIVE — AB
Candida Glabrata: NEGATIVE
Candida Vaginitis: POSITIVE — AB
Chlamydia: NEGATIVE
Comment: NEGATIVE
Comment: NEGATIVE
Comment: NEGATIVE
Comment: NEGATIVE
Comment: NEGATIVE
Comment: NORMAL
Neisseria Gonorrhea: NEGATIVE
Trichomonas: NEGATIVE

## 2022-06-11 MED ORDER — FLUCONAZOLE 150 MG PO TABS: ORAL_TABLET | ORAL | 1 refills | Status: DC

## 2022-06-11 MED ORDER — METRONIDAZOLE 500 MG PO TABS: 500.0000 mg | ORAL_TABLET | Freq: Two times a day (BID) | ORAL | 0 refills | Status: DC

## 2022-06-11 NOTE — Progress Notes (Signed)
+  BV and yeast on vaginal swab, will rx flagyl and diflucan, no sex or alcohol while taking meds

## 2022-06-11 NOTE — Patient Outreach (Signed)
Care Coordination  06/11/2022  ANGELIC SCHNELLE Feb 17, 1991 [id number]  RNCM called patient at scheduled time.  Patient's Father answered phone and stated patient was not home.  RNCM rescheduled appointment.  Aida Raider RN, BSN [place]  [facility] - Managed Medicaid High Risk [phone].

## 2022-06-12 DIAGNOSIS — H5213 Myopia, bilateral: Secondary | ICD-10-CM | POA: Diagnosis not present

## 2022-06-13 ENCOUNTER — Ambulatory Visit (INDEPENDENT_AMBULATORY_CARE_PROVIDER_SITE_OTHER): Payer: Medicaid Other | Admitting: Adult Health

## 2022-06-13 ENCOUNTER — Encounter: Payer: Self-pay | Admitting: Adult Health

## 2022-06-13 ENCOUNTER — Other Ambulatory Visit (HOSPITAL_COMMUNITY)
Admission: RE | Admit: 2022-06-13 | Discharge: 2022-06-13 | Disposition: A | Discharge status: Home / Self Care | Payer: Medicaid Other | Source: Ambulatory Visit | Attending: Adult Health | Admitting: Adult Health

## 2022-06-13 VITALS — BP 105/68 | HR 58 | Ht 68.0 in | Wt 194.0 lb

## 2022-06-13 DIAGNOSIS — Z8742 Personal history of other diseases of the female genital tract: Secondary | ICD-10-CM | POA: Insufficient documentation

## 2022-06-13 DIAGNOSIS — Z124 Encounter for screening for malignant neoplasm of cervix: Secondary | ICD-10-CM

## 2022-06-13 DIAGNOSIS — Z01419 Encounter for gynecological examination (general) (routine) without abnormal findings: Secondary | ICD-10-CM | POA: Insufficient documentation

## 2022-06-13 LAB — H. PYLORI BREATH TEST: H. pylori Breath Test: NOT DETECTED

## 2022-06-13 NOTE — Progress Notes (Signed)
  Subjective:     Patient ID: Debbie Santana, female   DOB: November 06, 1990, 31 y.o.   MRN: [id number]  HPI Debbie Santana is a 31 year old white female, single, G2P1011 in for 6 month pap. Pap was ASUS, negative HPV 09/05/21.  PCP is Dr [name]  Review of Systems Vaginal discharge, recently treated for BV and yeast Reviewed past medical,surgical, social and family history. Reviewed medications and allergies.     Objective:   Physical Exam BP 105/68 (BP Location: Left Arm, Patient Position: Sitting, Cuff Size: Normal)   Pulse (!) 58   Ht '5\' 8"'$  (1.727 m)   Wt 194 lb (88 kg)   LMP 05/27/2022   BMI 29.50 kg/m     Skin warm and dry.Pelvic: external genitalia is normal in appearance no lesions, vagina: white discharge without odor,urethra has no lesions or masses noted, cervix: bulbous,friable with EC brush, pap with GC/CHL and HR HPV genotyping performed, uterus: normal size, shape and contour, non tender, no masses felt, adnexa: no masses or tenderness noted. Bladder is non tender and no masses felt. AA is 0 Fall risk is moderate    06/13/2022    2:50 PM 09/05/2021   10:42 AM 10/31/2020    1:37 PM  Depression screen PHQ 2/9  Decreased Interest '3 1 2  '$ Down, Depressed, Hopeless '3 2 3  '$ PHQ - 2 Score '6 3 5  '$ Altered sleeping '2 3 2  '$ Tired, decreased energy '3 3 1  '$ Change in appetite '3 2 3  '$ Feeling bad or failure about yourself  '3 3 3  '$ Trouble concentrating '3 3 3  '$ Moving slowly or fidgety/restless 0 0 0  Suicidal thoughts 0  0  PHQ-9 Score '20 17 17   '$ On meds    06/13/2022    2:51 PM 09/05/2021   10:42 AM 10/31/2020    1:38 PM 08/10/2020   10:40 AM  GAD 7 : Generalized Anxiety Score  Nervous, Anxious, on Edge '3 3 3 3  '$ Control/stop worrying '3 3 3 3  '$ Worry too much - different things '3 3 3 3  '$ Trouble relaxing '3 3 3 3  '$ Restless 0 '3 3 3  '$ Easily annoyed or irritable '3 3 3 3  '$ Afraid - awful might happen '3 3 2 1  '$ Total GAD 7 Score '18 21 20 19  '$ Anxiety Difficulty    Extremely difficult       [place] - 06/13/22 1447       Pregnancy Intention Screening   Does the patient want to become pregnant in the next year? No    Does the patient's partner want to become pregnant in the next year? No    Would the patient like to discuss contraceptive options today? No      Contraception Wrap Up   Current Method Hormonal Implant    End Method Hormonal Implant            Examination chaperoned by Levy Pupa LPN  Assessment:     1. History of abnormal cervical Pap smear Last pap was +ASCUS negative pap Pap sent   2. Encounter for gynecological examination with Papanicolaou smear of cervix Pap sent Pap in 3 years if normal Physical in 3 months     Plan:     Follow up in 3 months for physical

## 2022-06-18 ENCOUNTER — Ambulatory Visit (HOSPITAL_COMMUNITY)
Admission: RE | Admit: 2022-06-18 | Discharge: 2022-06-18 | Disposition: A | Discharge status: Home / Self Care | Payer: Medicaid Other | Source: Ambulatory Visit | Attending: Gastroenterology | Admitting: Gastroenterology

## 2022-06-18 DIAGNOSIS — G8929 Other chronic pain: Secondary | ICD-10-CM | POA: Diagnosis not present

## 2022-06-18 DIAGNOSIS — K3189 Other diseases of stomach and duodenum: Secondary | ICD-10-CM | POA: Insufficient documentation

## 2022-06-18 DIAGNOSIS — R109 Unspecified abdominal pain: Secondary | ICD-10-CM | POA: Diagnosis not present

## 2022-06-18 DIAGNOSIS — R1013 Epigastric pain: Secondary | ICD-10-CM | POA: Diagnosis not present

## 2022-06-18 MED ORDER — IOHEXOL 300 MG/ML  SOLN
100.0000 mL | Freq: Once | INTRAMUSCULAR | Status: AC | PRN
  Administered 2022-06-18: 100 mL via INTRAVENOUS

## 2022-06-19 LAB — CYTOLOGY - PAP
Chlamydia: NEGATIVE
Comment: NEGATIVE
Comment: NEGATIVE
Comment: NORMAL
Diagnosis: UNDETERMINED — AB
High risk HPV: NEGATIVE
Neisseria Gonorrhea: NEGATIVE

## 2022-06-21 ENCOUNTER — Encounter: Payer: Self-pay | Admitting: Adult Health

## 2022-07-01 DIAGNOSIS — G43909 Migraine, unspecified, not intractable, without status migrainosus: Secondary | ICD-10-CM | POA: Diagnosis not present

## 2022-07-01 DIAGNOSIS — Z79891 Long term (current) use of opiate analgesic: Secondary | ICD-10-CM | POA: Diagnosis not present

## 2022-07-01 DIAGNOSIS — F331 Major depressive disorder, recurrent, moderate: Secondary | ICD-10-CM | POA: Diagnosis not present

## 2022-07-01 DIAGNOSIS — M5451 Vertebrogenic low back pain: Secondary | ICD-10-CM | POA: Diagnosis not present

## 2022-07-15 ENCOUNTER — Other Ambulatory Visit: Payer: Self-pay | Admitting: Obstetrics and Gynecology

## 2022-07-15 NOTE — Patient Instructions (Signed)
Hi Ms. Rhodus, sorry that we missed you today-I hope all is okay- as a part of your Medicaid benefit, you are eligible for care management and care coordination services at no cost or copay. I was unable to reach you by phone today but would be happy to help you with your health related needs. Please feel free to call me at [phone].  A member of the Managed Medicaid care management team will reach out to you again over the next 30 business  days.   Aida Raider RN, BSN [place]  [facility] - Managed Medicaid High Risk [phone]

## 2022-07-15 NOTE — Patient Outreach (Signed)
Care Coordination  07/15/2022  [name] [birth year] [id number]   Medicaid Managed Care   Unsuccessful Outreach Note  07/15/2022 Name: Debbie Santana MRN: [id number] DOB: Apr 23, 1991  Referred by: Leslie Andrea, MD Reason for referral : High Risk Managed Medicaid (Unsuccessful telephone outreach)   An unsuccessful telephone outreach was attempted today. The patient was referred to the case management team for assistance with care management and care coordination.   Follow Up Plan: The care management team will reach out to the patient again over the next 30 business  days.   Aida Raider RN, BSN [place]  [facility] - Managed Medicaid High Risk [phone].

## 2022-07-30 DIAGNOSIS — F410 Panic disorder [episodic paroxysmal anxiety] without agoraphobia: Secondary | ICD-10-CM | POA: Diagnosis not present

## 2022-07-30 DIAGNOSIS — M545 Low back pain, unspecified: Secondary | ICD-10-CM | POA: Diagnosis not present

## 2022-07-30 DIAGNOSIS — M542 Cervicalgia: Secondary | ICD-10-CM | POA: Diagnosis not present

## 2022-07-30 DIAGNOSIS — R5383 Other fatigue: Secondary | ICD-10-CM | POA: Diagnosis not present

## 2022-07-31 ENCOUNTER — Other Ambulatory Visit (HOSPITAL_COMMUNITY): Payer: Self-pay | Admitting: Nurse Practitioner

## 2022-07-31 ENCOUNTER — Encounter (HOSPITAL_COMMUNITY): Payer: Self-pay | Admitting: Nurse Practitioner

## 2022-07-31 DIAGNOSIS — R200 Anesthesia of skin: Secondary | ICD-10-CM

## 2022-08-01 ENCOUNTER — Ambulatory Visit (HOSPITAL_COMMUNITY)
Admission: RE | Admit: 2022-08-01 | Discharge: 2022-08-01 | Disposition: A | Discharge status: Home / Self Care | Payer: Medicaid Other | Source: Ambulatory Visit | Attending: Nurse Practitioner | Admitting: Nurse Practitioner

## 2022-08-01 DIAGNOSIS — R200 Anesthesia of skin: Secondary | ICD-10-CM | POA: Diagnosis not present

## 2022-08-01 DIAGNOSIS — R202 Paresthesia of skin: Secondary | ICD-10-CM | POA: Insufficient documentation

## 2022-08-13 ENCOUNTER — Other Ambulatory Visit: Payer: Medicaid Other | Admitting: Obstetrics and Gynecology

## 2022-08-13 NOTE — Patient Instructions (Signed)
Hi Ms. Buckingham, sorry to have missed you today- as a part of your Medicaid benefit, you are eligible for care management and care coordination services at no cost or copay. I was unable to reach you by phone today but would be happy to help you with your health related needs. Please feel free to call me at [phone].  A member of the Managed Medicaid care management team will reach out to you again over the next 30 business days.   Aida Raider RN, BSN [place]  [facility] - Managed Medicaid High Risk [phone].

## 2022-08-13 NOTE — Patient Outreach (Signed)
  Medicaid Managed Care   Unsuccessful Attempt Note   08/13/2022 Name: JORDANA DUGUE MRN: [id number] DOB: 1991/05/15  Referred by: Leslie Andrea, MD Reason for referral : High Risk Managed Medicaid (Unsuccessful telephone outreach)  A second unsuccessful telephone outreach was attempted today. The patient was referred to the case management team for assistance with care management and care coordination.    Follow Up Plan: The Managed Medicaid care management team will reach out to the patient again over the next 30 business  days. and The  Patient has been provided with contact information for the Managed Medicaid care management team and has been advised to call with any health related questions or concerns.    Aida Raider RN, BSN [place]  [facility] - Managed Medicaid High Risk [phone].

## 2022-08-19 ENCOUNTER — Encounter: Payer: Self-pay | Admitting: Neurology

## 2022-08-20 ENCOUNTER — Encounter: Payer: Self-pay | Admitting: Psychiatry

## 2022-08-20 ENCOUNTER — Telehealth: Payer: Self-pay

## 2022-08-20 ENCOUNTER — Ambulatory Visit: Payer: Medicaid Other | Admitting: Psychiatry

## 2022-08-20 NOTE — Progress Notes (Deleted)
Referring:  Leslie Andrea, MD [address] [place],  [place] [id number]  PCP: Leslie Andrea, MD  Neurology was asked to evaluate Debbie Santana, a 31 year old female for a chief complaint of headaches.  Our recommendations of care will be communicated by shared medical record.    CC:  headaches  History provided from ***  HPI:  Medical co-morbidities: ***  The patient presents for evaluation of headaches which began***  She has a history of lumbar stenosis s/p L4-5 discectomy. Takes oxycodone for chronic low back pain  Headache History: Onset: Triggers: Aura: Location: Quality/Description: Associated Symptoms:  Photophobia:  Phonophobia:  Nausea: Vomiting: Allodynia: Other symptoms: Worse with activity?: Duration of headaches:  Headache days per month: *** Migraine days per month: *** Headache free days per month: ***  Current Treatment: Abortive ***  Preventative ***  Prior Therapies                                 Sertraline 200 mg daily Fluoxetine NSAIDs - ulcers Zofran 8 mg PRN Fioricet Imitrex 25 mg PRN Maxalt 10 mg PRN Nurtec 75 mg PRN Ubrelvy 100 mg PRN oxycodone  LABS: ***  IMAGING:  ***  ***Imaging independently reviewed on August 20, 2022   Current Outpatient Medications on File Prior to Visit  Medication Sig Dispense Refill   albuterol (VENTOLIN HFA) 108 (90 Base) MCG/ACT inhaler Inhale 2 puffs into the lungs every 6 (six) hours as needed for wheezing or shortness of breath. 1 each 0   butalbital-acetaminophen-caffeine (FIORICET) 50-325-40 MG tablet TAKE 1 TABLET BY MOUTH EVERY 6 HOURS AS NEEDED FOR MIGRAINE HEADACHE (Patient taking differently: Take 2 tablets by mouth 2 (two) times daily as needed for migraine.) 20 tablet 0   diphenhydrAMINE (BENADRYL) 25 MG tablet Take 25 mg by mouth every 6 (six) hours as needed for allergies.     etonogestrel (NEXPLANON) 68 MG IMPL implant 68 mg by Subdermal route once.     fluconazole  (DIFLUCAN) 150 MG tablet Take 1 now and 1 in 3 days 2 tablet 1   hyoscyamine (LEVSIN SL) 0.125 MG SL tablet Place 1 tablet (0.125 mg total) under the tongue every 6 (six) hours as needed. 30 tablet 1   LORazepam (ATIVAN) 1 MG tablet Take 1 mg by mouth 4 (four) times daily as needed for anxiety.     metroNIDAZOLE (FLAGYL) 500 MG tablet Take 1 tablet (500 mg total) by mouth 2 (two) times daily. 14 tablet 0   ondansetron (ZOFRAN) 8 MG tablet Take 8 mg by mouth every 8 (eight) hours as needed for nausea or vomiting.     oxyCODONE-acetaminophen (PERCOCET) 10-325 MG tablet SMARTSIG:1 Tablet(s) By Mouth 1-4 Times Daily     sertraline (ZOLOFT) 50 MG tablet Take 200 mg by mouth daily.     sucralfate (CARAFATE) 1 g tablet Take 1 tablet (1 g total) by mouth 3 (three) times daily before meals. 90 tablet 3   No current facility-administered medications on file prior to visit.     Allergies: Allergies  Allergen Reactions   Nsaids Other (See Comments)    History of ulcers    Family History: Migraine or other headaches in the family:  *** Aneurysms in a first degree relative:  *** Brain tumors in the family:  *** Other neurological illness in the family:   ***  Past Medical History: Past Medical History:  Diagnosis Date  Anxiety    Bradycardia 04/17/2017   Chest pain on breathing 02/22/2020   Chronic back pain    Depression    Erosive gastritis 04/11/2017   Gastric ulcer    Genital warts    GERD (gastroesophageal reflux disease)    History of gestational hypertension 05/21/2018   Dx intrapartum   History of kidney stones    Intertrigo 11/16/2019   Intractable abdominal pain 04/11/2017   Intractable vomiting 05/20/2020   Irregular intermenstrual bleeding 02/19/2019   Mental disorder    Migraine without aura and without status migrainosus, not intractable 02/19/2019   Nausea 05/10/2020   Nausea & vomiting 12/07/2018   Nexplanon in place 02/19/2019   Obesity 04/17/2017   Panic  disorder    Peptic ulcer disease 04/13/2017   Pinched nerve 09/24/2019   Postpartum hypertension 06/23/2018   requiring norvasc, resolved at pp visit   Sciatica    Tremor of both hands    Vaginal Pap smear, abnormal    Wheezing     Past Surgical History Past Surgical History:  Procedure Laterality Date   BACK SURGERY     ruptured disc.   BACK SURGERY  11/2021   BIOPSY  04/14/2017   Procedure: BIOPSY;  Surgeon: Rogene Houston, MD;  [facility];  Service: Endoscopy;;  gastric   BIOPSY  05/22/2020   Procedure: BIOPSY;  Surgeon: Daneil Dolin, MD;  [facility];  Service: Endoscopy;;   BIOPSY  08/29/2020   Procedure: BIOPSY;  Surgeon: Harvel Quale, MD;  [facility];  Service: Gastroenterology;;   BIOPSY  09/09/2020   Procedure: BIOPSY;  Surgeon: Eloise Harman, DO;  [facility];  Service: Endoscopy;;  duodenum;antral   BIOPSY  11/21/2021   Procedure: BIOPSY;  Surgeon: Rogene Houston, MD;  [facility];  Service: Endoscopy;;   CERVICAL ABLATION N/A 12/02/2018   Procedure: LASER ABLATION OF CERVIX;  Surgeon: Florian Buff, MD;  Location: [facility];  Service: Gynecology;  Laterality: N/A;   CHOLECYSTECTOMY N/A 04/17/2017   Procedure: LAPAROSCOPIC CHOLECYSTECTOMY;  Surgeon: Aviva Signs, MD;  Location: [facility];  Service: General;  Laterality: N/A;   COLONOSCOPY WITH PROPOFOL N/A 08/29/2020   Procedure: COLONOSCOPY WITH PROPOFOL;  Surgeon: Harvel Quale, MD;  [facility];  Service: Gastroenterology;  Laterality: N/A;  11:15   COLPOSCOPY     ESOPHAGOGASTRODUODENOSCOPY (EGD) WITH PROPOFOL N/A 04/14/2017   Procedure: ESOPHAGOGASTRODUODENOSCOPY (EGD) WITH PROPOFOL;  Surgeon: Rogene Houston, MD;  [facility];  Service: Endoscopy;  Laterality: N/A;   ESOPHAGOGASTRODUODENOSCOPY (EGD) WITH PROPOFOL N/A 05/22/2020   Procedure: ESOPHAGOGASTRODUODENOSCOPY (EGD) WITH PROPOFOL;  Surgeon:  Daneil Dolin, MD;  [facility];  Service: Endoscopy;  Laterality: N/A;   ESOPHAGOGASTRODUODENOSCOPY (EGD) WITH PROPOFOL N/A 09/09/2020   Procedure: ESOPHAGOGASTRODUODENOSCOPY (EGD) WITH PROPOFOL;  Surgeon: Eloise Harman, DO;  [facility];  Service: Endoscopy;  Laterality: N/A;   ESOPHAGOGASTRODUODENOSCOPY (EGD) WITH PROPOFOL N/A 11/21/2020   Procedure: ESOPHAGOGASTRODUODENOSCOPY (EGD) WITH PROPOFOL;  Surgeon: Harvel Quale, MD;  [facility];  Service: Gastroenterology;  Laterality: N/A;  Am   ESOPHAGOGASTRODUODENOSCOPY (EGD) WITH PROPOFOL N/A 11/21/2021   Procedure: ESOPHAGOGASTRODUODENOSCOPY (EGD) WITH PROPOFOL;  Surgeon: Rogene Houston, MD;  [facility];  Service: Endoscopy;  Laterality: N/A;  820   HERNIA REPAIR Bilateral    inguinal   LASER ABLATION CONDOLAMATA N/A 12/02/2018   Procedure: LASER ABLATION CONDYLOMA ACCUMINATA  LEFT AND RIGHT VULVA, PERINEUM AND PERIANAL (15 TOTAL);  Surgeon: Florian Buff, MD;  [facility];  Service: Gynecology;  Laterality: N/A;   LUMBAR LAMINECTOMY/DECOMPRESSION MICRODISCECTOMY Right 12/03/2021   Procedure: MICRODISCECTOMY Lumbar four- five;  Surgeon: Newman Pies, MD;  Location: [facility];  Service: Neurosurgery;  Laterality: Right;   MOUTH SURGERY  09/2021   All top teeth and all wisdom teeth removed   MOUTH SURGERY  12/2021   POLYPECTOMY  08/29/2020   Procedure: POLYPECTOMY;  Surgeon: Harvel Quale, MD;  [facility];  Service: Gastroenterology;;    Social History: Social History   Tobacco Use   Smoking status: Every Day    Packs/day: 1.00    Years: 10.00    Total pack years: 10.00    Types: Cigarettes    Passive exposure: Current   Smokeless tobacco: Never  Vaping Use   Vaping Use: Former  Substance Use Topics   Alcohol use: No   Drug use: No   ***  ROS: Negative for fevers, chills. Positive for***. All other systems reviewed and negative  unless stated otherwise in HPI.   Physical Exam:   Vital Signs: There were no vitals taken for this visit. GENERAL: well appearing,in no acute distress,alert SKIN:  Color, texture, turgor normal. No rashes or lesions HEAD:  Normocephalic/atraumatic. CV:  RRR RESP: Normal respiratory effort MSK: no tenderness to palpation over occiput, neck, or shoulders  NEUROLOGICAL: Mental Status: Alert, oriented to person, place and time,Follows commands Cranial Nerves: PERRL, visual fields intact to confrontation, extraocular movements intact, facial sensation intact, no facial droop or ptosis, hearing grossly intact, no dysarthria, palate elevate symmetrically, tongue protrudes midline, shoulder shrug intact and symmetric Motor: muscle strength 5/5 both upper and lower extremities,no drift, normal tone Reflexes: 2+ throughout Sensation: intact to light touch all 4 extremities Coordination: Finger-to- nose-finger intact bilaterally Gait: normal-based   IMPRESSION: ***  PLAN: ***   I spent a total of *** minutes chart reviewing and counseling the patient. Headache education was done. Discussed treatment options including preventive and acute medications, natural supplements, and physical therapy. Discussed medication overuse headache and to limit use of acute treatments to no more than 2 days/week or 10 days/month. Discussed medication side effects, adverse reactions and drug interactions. Written educational materials and patient instructions outlining all of the above were given.  Follow-up: ***   Genia Harold, MD 08/20/2022   8:24 AM

## 2022-08-20 NOTE — Telephone Encounter (Signed)
..   Medicaid Managed Care Note  08/20/2022 Name: [name] MRN: [id number] DOB: [birth year]  Debbie Santana is a 31 y.o. year old female who is a primary care patient of Leslie Andrea, MD and is actively engaged with the care management team. I reached out to Debbie Santana by phone today to assist with re-scheduling a follow up visit with the RN Case Manager  Follow up plan: The care management team will reach out to the patient again over the next 7 days.     [place]

## 2022-08-26 ENCOUNTER — Telehealth: Payer: Self-pay

## 2022-08-26 NOTE — Telephone Encounter (Signed)
..   Medicaid Managed Care Note  08/26/2022 Name: TIFFINEY SPARROW MRN: [id number] DOB: [birth year]  [name] is a 31 y.o. year old female who is a primary care patient of Leslie Andrea, MD and is actively engaged with the care management team. I reached out to [name] by phone today to assist with re-scheduling a follow up visit with the RN Case Manager  Follow up plan: Unsuccessful telephone outreach attempt made. A HIPAA compliant phone message was left for the patient providing contact information and requesting a return call.  The care management team will reach out to the patient again over the next 7 days.     [place]

## 2022-08-27 DIAGNOSIS — M542 Cervicalgia: Secondary | ICD-10-CM | POA: Diagnosis not present

## 2022-08-27 DIAGNOSIS — G43911 Migraine, unspecified, intractable, with status migrainosus: Secondary | ICD-10-CM | POA: Diagnosis not present

## 2022-08-27 DIAGNOSIS — M545 Low back pain, unspecified: Secondary | ICD-10-CM | POA: Diagnosis not present

## 2022-08-27 DIAGNOSIS — Z79891 Long term (current) use of opiate analgesic: Secondary | ICD-10-CM | POA: Diagnosis not present

## 2022-09-06 ENCOUNTER — Other Ambulatory Visit (INDEPENDENT_AMBULATORY_CARE_PROVIDER_SITE_OTHER): Payer: Medicaid Other

## 2022-09-06 ENCOUNTER — Other Ambulatory Visit (HOSPITAL_COMMUNITY)
Admission: RE | Admit: 2022-09-06 | Discharge: 2022-09-06 | Disposition: A | Discharge status: Home / Self Care | Payer: Medicaid Other | Source: Ambulatory Visit | Attending: Obstetrics & Gynecology | Admitting: Obstetrics & Gynecology

## 2022-09-06 DIAGNOSIS — N898 Other specified noninflammatory disorders of vagina: Secondary | ICD-10-CM | POA: Diagnosis not present

## 2022-09-06 DIAGNOSIS — R350 Frequency of micturition: Secondary | ICD-10-CM

## 2022-09-06 DIAGNOSIS — R339 Retention of urine, unspecified: Secondary | ICD-10-CM | POA: Diagnosis not present

## 2022-09-06 LAB — POCT URINALYSIS DIPSTICK OB
Blood, UA: NEGATIVE
Glucose, UA: NEGATIVE
Ketones, UA: NEGATIVE
Leukocytes, UA: NEGATIVE
Nitrite, UA: NEGATIVE
POC,PROTEIN,UA: NEGATIVE

## 2022-09-06 NOTE — Progress Notes (Signed)
   NURSE VISIT- VAGINITIS/UTI symptoms  SUBJECTIVE:  Debbie Santana is a 31 y.o. G2P1011 GYN patientfemale here for a vaginal swab for vaginitis screening.  She reports the following symptoms: discharge described as white and green, vulvar itching, and pain with intercourse  for 1 week. Also reports urinary retention/frequency and lower back pain.   OBJECTIVE:  There were no vitals taken for this visit.  Appears well, in no apparent distress  ASSESSMENT: Vaginal swab for vaginitis screening Urine dip negative   PLAN: Self-collected vaginal probe for Gonorrhea, Chlamydia, Trichomonas, Bacterial Vaginosis, Yeast sent to lab.  Urine sent for UA/culture(pt request) Treatment: to be determined once results are received Follow-up as needed if symptoms persist/worsen, or new symptoms develop  [name]  09/06/2022 9:19 AM

## 2022-09-07 LAB — URINALYSIS, ROUTINE W REFLEX MICROSCOPIC
Bilirubin, UA: NEGATIVE
Glucose, UA: NEGATIVE
Ketones, UA: NEGATIVE
Leukocytes,UA: NEGATIVE
Nitrite, UA: NEGATIVE
Protein,UA: NEGATIVE
RBC, UA: NEGATIVE
Specific Gravity, UA: 1.022 (ref 1.005–1.030)
Urobilinogen, Ur: 0.2 mg/dL (ref 0.2–1.0)
pH, UA: 5.5 (ref 5.0–7.5)

## 2022-09-09 LAB — CERVICOVAGINAL ANCILLARY ONLY
Bacterial Vaginitis (gardnerella): NEGATIVE
Candida Glabrata: NEGATIVE
Candida Vaginitis: NEGATIVE
Chlamydia: NEGATIVE
Comment: NEGATIVE
Comment: NEGATIVE
Comment: NEGATIVE
Comment: NEGATIVE
Comment: NEGATIVE
Comment: NORMAL
Neisseria Gonorrhea: NEGATIVE
Trichomonas: NEGATIVE

## 2022-09-09 LAB — URINE CULTURE

## 2022-09-24 NOTE — Progress Notes (Deleted)
Referring:  Leslie Andrea, MD [address] [place],  [place] [address]  PCP: Leslie Andrea, MD  Neurology was asked to evaluate Debbie Santana, a 32 year old female for a chief complaint of headaches.  Our recommendations of care will be communicated by shared medical record.    CC:  headaches  History provided from ***  HPI:  Medical co-morbidities: ***  The patient presents for evaluation of headaches which began***  Headache History: Onset: Triggers: Aura: Location: Quality/Description: Associated Symptoms:  Photophobia:  Phonophobia:  Nausea: Vomiting: Allodynia: Other symptoms: Worse with activity?: Duration of headaches:  Headache days per month: *** Migraine days per month: *** Headache free days per month: ***  Current Treatment: Abortive ***  Preventative ***  Prior Therapies                                 Imitrex 25 mg PRN Fioricet Topamax 25 mg QHS - side effects   LABS: ***  IMAGING:  ***  ***Imaging independently reviewed on September 24, 2022   Current Outpatient Medications on File Prior to Visit  Medication Sig Dispense Refill   albuterol (VENTOLIN HFA) 108 (90 Base) MCG/ACT inhaler Inhale 2 puffs into the lungs every 6 (six) hours as needed for wheezing or shortness of breath. 1 each 0   butalbital-acetaminophen-caffeine (FIORICET) 50-325-40 MG tablet TAKE 1 TABLET BY MOUTH EVERY 6 HOURS AS NEEDED FOR MIGRAINE HEADACHE (Patient taking differently: Take 2 tablets by mouth 2 (two) times daily as needed for migraine.) 20 tablet 0   diphenhydrAMINE (BENADRYL) 25 MG tablet Take 25 mg by mouth every 6 (six) hours as needed for allergies.     etonogestrel (NEXPLANON) 68 MG IMPL implant 68 mg by Subdermal route once.     fluconazole (DIFLUCAN) 150 MG tablet Take 1 now and 1 in 3 days 2 tablet 1   hyoscyamine (LEVSIN SL) 0.125 MG SL tablet Place 1 tablet (0.125 mg total) under the tongue every 6 (six) hours as needed. 30 tablet 1    LORazepam (ATIVAN) 1 MG tablet Take 1 mg by mouth 4 (four) times daily as needed for anxiety.     metroNIDAZOLE (FLAGYL) 500 MG tablet Take 1 tablet (500 mg total) by mouth 2 (two) times daily. 14 tablet 0   ondansetron (ZOFRAN) 8 MG tablet Take 8 mg by mouth every 8 (eight) hours as needed for nausea or vomiting.     oxyCODONE-acetaminophen (PERCOCET) 10-325 MG tablet SMARTSIG:1 Tablet(s) By Mouth 1-4 Times Daily     sertraline (ZOLOFT) 50 MG tablet Take 200 mg by mouth daily.     sucralfate (CARAFATE) 1 g tablet Take 1 tablet (1 g total) by mouth 3 (three) times daily before meals. 90 tablet 3   No current facility-administered medications on file prior to visit.     Allergies: Allergies  Allergen Reactions   Nsaids Other (See Comments)    History of ulcers    Family History: Migraine or other headaches in the family:  *** Aneurysms in a first degree relative:  *** Brain tumors in the family:  *** Other neurological illness in the family:   ***  Past Medical History: Past Medical History:  Diagnosis Date   Anxiety    Bradycardia 04/17/2017   Chest pain on breathing 02/22/2020   Chronic back pain    Depression    Erosive gastritis 04/11/2017   Gastric ulcer  Genital warts    GERD (gastroesophageal reflux disease)    History of gestational hypertension 05/21/2018   Dx intrapartum   History of kidney stones    Intertrigo 11/16/2019   Intractable abdominal pain 04/11/2017   Intractable vomiting 05/20/2020   Irregular intermenstrual bleeding 02/19/2019   Mental disorder    Migraine without aura and without status migrainosus, not intractable 02/19/2019   Nausea 05/10/2020   Nausea & vomiting 12/07/2018   Nexplanon in place 02/19/2019   Obesity 04/17/2017   Panic disorder    Peptic ulcer disease 04/13/2017   Pinched nerve 09/24/2019   Postpartum hypertension 06/23/2018   requiring norvasc, resolved at pp visit   Sciatica    Tremor of both hands    Vaginal Pap  smear, abnormal    Wheezing     Past Surgical History Past Surgical History:  Procedure Laterality Date   BACK SURGERY     ruptured disc.   BACK SURGERY  11/2021   BIOPSY  04/14/2017   Procedure: BIOPSY;  Surgeon: Rogene Houston, MD;  [facility];  Service: Endoscopy;;  gastric   BIOPSY  05/22/2020   Procedure: BIOPSY;  Surgeon: Daneil Dolin, MD;  [facility];  Service: Endoscopy;;   BIOPSY  08/29/2020   Procedure: BIOPSY;  Surgeon: Harvel Quale, MD;  [facility];  Service: Gastroenterology;;   BIOPSY  09/09/2020   Procedure: BIOPSY;  Surgeon: Eloise Harman, DO;  [facility];  Service: Endoscopy;;  duodenum;antral   BIOPSY  11/21/2021   Procedure: BIOPSY;  Surgeon: Rogene Houston, MD;  [facility];  Service: Endoscopy;;   CERVICAL ABLATION N/A 12/02/2018   Procedure: LASER ABLATION OF CERVIX;  Surgeon: Florian Buff, MD;  [facility];  Service: Gynecology;  Laterality: N/A;   CHOLECYSTECTOMY N/A 04/17/2017   Procedure: LAPAROSCOPIC CHOLECYSTECTOMY;  Surgeon: Aviva Signs, MD;  [facility];  Service: General;  Laterality: N/A;   COLONOSCOPY WITH PROPOFOL N/A 08/29/2020   Procedure: COLONOSCOPY WITH PROPOFOL;  Surgeon: Harvel Quale, MD;  [facility];  Service: Gastroenterology;  Laterality: N/A;  11:15   COLPOSCOPY     ESOPHAGOGASTRODUODENOSCOPY (EGD) WITH PROPOFOL N/A 04/14/2017   Procedure: ESOPHAGOGASTRODUODENOSCOPY (EGD) WITH PROPOFOL;  Surgeon: Rogene Houston, MD;  [facility];  Service: Endoscopy;  Laterality: N/A;   ESOPHAGOGASTRODUODENOSCOPY (EGD) WITH PROPOFOL N/A 05/22/2020   Procedure: ESOPHAGOGASTRODUODENOSCOPY (EGD) WITH PROPOFOL;  Surgeon: Daneil Dolin, MD;  [facility];  Service: Endoscopy;  Laterality: N/A;   ESOPHAGOGASTRODUODENOSCOPY (EGD) WITH PROPOFOL N/A 09/09/2020   Procedure: ESOPHAGOGASTRODUODENOSCOPY (EGD) WITH  PROPOFOL;  Surgeon: Eloise Harman, DO;  [facility];  Service: Endoscopy;  Laterality: N/A;   ESOPHAGOGASTRODUODENOSCOPY (EGD) WITH PROPOFOL N/A 11/21/2020   Procedure: ESOPHAGOGASTRODUODENOSCOPY (EGD) WITH PROPOFOL;  Surgeon: Harvel Quale, MD;  [facility];  Service: Gastroenterology;  Laterality: N/A;  Am   ESOPHAGOGASTRODUODENOSCOPY (EGD) WITH PROPOFOL N/A 11/21/2021   Procedure: ESOPHAGOGASTRODUODENOSCOPY (EGD) WITH PROPOFOL;  Surgeon: Rogene Houston, MD;  [facility];  Service: Endoscopy;  Laterality: N/A;  820   HERNIA REPAIR Bilateral    inguinal   LASER ABLATION CONDOLAMATA N/A 12/02/2018   Procedure: LASER ABLATION CONDYLOMA ACCUMINATA LEFT AND RIGHT VULVA, PERINEUM AND PERIANAL (15 TOTAL);  Surgeon: Florian Buff, MD;  [facility];  Service: Gynecology;  Laterality: N/A;   LUMBAR LAMINECTOMY/DECOMPRESSION MICRODISCECTOMY Right 12/03/2021   Procedure:  MICRODISCECTOMY Lumbar four- five;  Surgeon: Newman Pies, MD;  Location: [facility];  Service: Neurosurgery;  Laterality: Right;   MOUTH SURGERY  09/2021   All top teeth and all wisdom teeth removed   MOUTH SURGERY  12/2021   POLYPECTOMY  08/29/2020   Procedure: POLYPECTOMY;  Surgeon: Harvel Quale, MD;  [facility];  Service: Gastroenterology;;    Social History: Social History   Tobacco Use   Smoking status: Every Day    Packs/day: 1.00    Years: 10.00    Total pack years: 10.00    Types: Cigarettes    Passive exposure: Current   Smokeless tobacco: Never  Vaping Use   Vaping Use: Former  Substance Use Topics   Alcohol use: No   Drug use: No   ***  ROS: Negative for fevers, chills. Positive for***. All other systems reviewed and negative unless stated otherwise in HPI.   Physical Exam:   Vital Signs: There were no vitals taken for this visit. GENERAL: well appearing,in no acute distress,alert SKIN:  Color, texture, turgor normal.  No rashes or lesions HEAD:  Normocephalic/atraumatic. CV:  RRR RESP: Normal respiratory effort MSK: no tenderness to palpation over occiput, neck, or shoulders  NEUROLOGICAL: Mental Status: Alert, oriented to person, place and time,Follows commands Cranial Nerves: PERRL, visual fields intact to confrontation, extraocular movements intact, facial sensation intact, no facial droop or ptosis, hearing grossly intact, no dysarthria, palate elevate symmetrically, tongue protrudes midline, shoulder shrug intact and symmetric Motor: muscle strength 5/5 both upper and lower extremities,no drift, normal tone Reflexes: 2+ throughout Sensation: intact to light touch all 4 extremities Coordination: Finger-to- nose-finger intact bilaterally Gait: normal-based   IMPRESSION: ***  PLAN: ***   I spent a total of *** minutes chart reviewing and counseling the patient. Headache education was done. Discussed treatment options including preventive and acute medications, natural supplements, and physical therapy. Discussed medication overuse headache and to limit use of acute treatments to no more than 2 days/week or 10 days/month. Discussed medication side effects, adverse reactions and drug interactions. Written educational materials and patient instructions outlining all of the above were given.  Follow-up: ***   Genia Harold, MD 09/24/2022   3:23 PM

## 2022-09-25 ENCOUNTER — Ambulatory Visit: Payer: Medicaid Other | Admitting: Psychiatry

## 2022-09-30 ENCOUNTER — Other Ambulatory Visit: Payer: Medicaid Other | Admitting: Obstetrics and Gynecology

## 2022-09-30 DIAGNOSIS — M549 Dorsalgia, unspecified: Secondary | ICD-10-CM | POA: Diagnosis not present

## 2022-09-30 DIAGNOSIS — M542 Cervicalgia: Secondary | ICD-10-CM | POA: Diagnosis not present

## 2022-09-30 DIAGNOSIS — M545 Low back pain, unspecified: Secondary | ICD-10-CM | POA: Diagnosis not present

## 2022-09-30 DIAGNOSIS — Z79891 Long term (current) use of opiate analgesic: Secondary | ICD-10-CM | POA: Diagnosis not present

## 2022-09-30 NOTE — Patient Instructions (Signed)
Hi Debbie Santana- as a part of your Medicaid benefit, you are eligible for care management and care coordination services at no cost or copay.  We have been unable to reach you by phone  but would be happy to help you with your health related needs. Please feel free to call me at [phone].  Aida Raider RN, BSN [place]  [facility] - Managed Medicaid High Risk [phone]

## 2022-09-30 NOTE — Patient Outreach (Cosign Needed)
Care Coordination  09/30/2022  DIYA GERVASI 09-25-1990 [id number]   Medicaid Managed Care   Unsuccessful Outreach Note  09/30/2022 Name: ARMINDA FOGLIO MRN: [id number] DOB: 01/04/1991  Referred by: Leslie Andrea, MD Reason for referral : High Risk Managed Medicaid (Unsuccessful telephone outreach)   Fourth  unsuccessful telephone outreach was attempted.  The patient was referred to the case management team for assistance with care management and care coordination. The patient's primary care provider has been notified of our unsuccessful attempts to make or maintain contact with the patient. The care management team is pleased to engage with this patient at any time in the future should he/she be interested in assistance from the care management team.   Follow Up Plan: We have been unable to make contact with the patient for follow up. The care management team is available to follow up with the patient after provider conversation with the patient regarding recommendation for care management engagement and subsequent re-referral to the care management team.   Aida Raider RN, BSN [facility] Management Coordinator - Managed [place] High Risk [phone]

## 2022-10-01 ENCOUNTER — Other Ambulatory Visit (HOSPITAL_COMMUNITY): Payer: Self-pay | Admitting: Nurse Practitioner

## 2022-10-01 DIAGNOSIS — M542 Cervicalgia: Secondary | ICD-10-CM

## 2022-10-01 DIAGNOSIS — M549 Dorsalgia, unspecified: Secondary | ICD-10-CM

## 2022-10-24 ENCOUNTER — Ambulatory Visit: Payer: Medicaid Other | Admitting: Adult Health

## 2022-10-24 ENCOUNTER — Encounter: Payer: Self-pay | Admitting: Adult Health

## 2022-10-24 VITALS — BP 112/74 | HR 62 | Ht 68.0 in | Wt 196.0 lb

## 2022-10-24 DIAGNOSIS — N941 Unspecified dyspareunia: Secondary | ICD-10-CM

## 2022-10-24 DIAGNOSIS — N898 Other specified noninflammatory disorders of vagina: Secondary | ICD-10-CM

## 2022-10-24 DIAGNOSIS — Z975 Presence of (intrauterine) contraceptive device: Secondary | ICD-10-CM | POA: Diagnosis not present

## 2022-10-24 DIAGNOSIS — N946 Dysmenorrhea, unspecified: Secondary | ICD-10-CM | POA: Diagnosis not present

## 2022-10-24 DIAGNOSIS — R102 Pelvic and perineal pain: Secondary | ICD-10-CM

## 2022-10-24 LAB — POCT URINALYSIS DIPSTICK
Glucose, UA: NEGATIVE
Leukocytes, UA: NEGATIVE
Nitrite, UA: NEGATIVE
Protein, UA: NEGATIVE

## 2022-10-24 MED ORDER — FLUCONAZOLE 150 MG PO TABS: ORAL_TABLET | ORAL | 1 refills | Status: DC

## 2022-10-24 NOTE — Progress Notes (Signed)
  Subjective:     Patient ID: Debbie Santana, female   DOB: 04/29/1991, 32 y.o.   MRN: [id number]  HPI Debbie Santana is a 32 year old white female, single, [id number], in complaining of pelvic pain,more to the right, pain with sex, and pain with periods from waist to thighs, also has had frequent yeast. She says it is hard to pee sometimes.   Last pap was ASCUS negative HPV 06/13/22.  PCP is Dr [name]  Review of Systems +pelvic pain +pain with sex Pain with periods Frequent yeast Hard to pee at times Reviewed past medical,surgical, social and family history. Reviewed medications and allergies.      Objective:   Physical Exam BP 112/74 (BP Location: Left Arm, Patient Position: Sitting, Cuff Size: Normal)   Pulse 62   Ht '5\' 8"'$  (1.727 m)   Wt 196 lb (88.9 kg)   LMP 10/03/2022 (Approximate)   BMI 29.80 kg/m  urine dipstick was negative    Skin warm and dry.Pelvic: external genitalia is normal in appearance no lesions, vagina: white discharge without odor,urethra has no lesions or masses noted, cervix:smooth and bulbous, uterus: normal size, shape and contour, mildly tender, no masses felt, adnexa: no masses, +tenderness noted, RLQ.  Bladder is non tender and no masses felt.  Fall risk is moderate  Upstream - 10/24/22 1123       Pregnancy Intention Screening   Does the patient want to become pregnant in the next year? No    Does the patient's partner want to become pregnant in the next year? No    Would the patient like to discuss contraceptive options today? No      Contraception Wrap Up   Current Method Hormonal Implant    End Method Hormonal Implant            Examination chaperoned by Levy Pupa LPN  Assessment:       1. Pelvic pain +pain esp RLQ Has cyst on CT 2023 Pelvic US scheduled for 11/05/22 at 9:30 am at [facility] Will talk when results back - US PELVIC COMPLETE WITH TRANSVAGINAL; Future - POCT urinalysis dipstick  2. Dyspareunia, female Try different  positions Will get [place] - US PELVIC COMPLETE WITH TRANSVAGINAL; Future  3. Dysmenorrhea Will ger [place] to assess uterus and ovaries  - US PELVIC COMPLETE WITH TRANSVAGINAL; Future  4. Nexplanon in place Placed 09/05/21  5. Vaginal discharge Will rx diflucan    Plan:     Follow up TBD

## 2022-10-25 ENCOUNTER — Ambulatory Visit (HOSPITAL_COMMUNITY)
Admission: RE | Admit: 2022-10-25 | Discharge: 2022-10-25 | Disposition: A | Discharge status: Home / Self Care | Payer: Medicaid Other | Source: Ambulatory Visit | Attending: Nurse Practitioner | Admitting: Nurse Practitioner

## 2022-10-25 DIAGNOSIS — M542 Cervicalgia: Secondary | ICD-10-CM | POA: Insufficient documentation

## 2022-10-29 DIAGNOSIS — M545 Low back pain, unspecified: Secondary | ICD-10-CM | POA: Diagnosis not present

## 2022-10-29 DIAGNOSIS — G4700 Insomnia, unspecified: Secondary | ICD-10-CM | POA: Diagnosis not present

## 2022-10-29 DIAGNOSIS — F330 Major depressive disorder, recurrent, mild: Secondary | ICD-10-CM | POA: Diagnosis not present

## 2022-10-29 DIAGNOSIS — M5451 Vertebrogenic low back pain: Secondary | ICD-10-CM | POA: Diagnosis not present

## 2022-11-05 ENCOUNTER — Ambulatory Visit (HOSPITAL_COMMUNITY)
Admission: RE | Admit: 2022-11-05 | Discharge: 2022-11-05 | Disposition: A | Discharge status: Home / Self Care | Payer: Medicaid Other | Source: Ambulatory Visit | Attending: Adult Health | Admitting: Adult Health

## 2022-11-05 DIAGNOSIS — N946 Dysmenorrhea, unspecified: Secondary | ICD-10-CM | POA: Diagnosis not present

## 2022-11-05 DIAGNOSIS — N941 Unspecified dyspareunia: Secondary | ICD-10-CM | POA: Insufficient documentation

## 2022-11-05 DIAGNOSIS — R102 Pelvic and perineal pain: Secondary | ICD-10-CM | POA: Diagnosis not present

## 2022-11-05 DIAGNOSIS — N83292 Other ovarian cyst, left side: Secondary | ICD-10-CM | POA: Diagnosis not present

## 2022-11-07 ENCOUNTER — Encounter (INDEPENDENT_AMBULATORY_CARE_PROVIDER_SITE_OTHER): Payer: Self-pay | Admitting: Gastroenterology

## 2022-11-07 ENCOUNTER — Ambulatory Visit (INDEPENDENT_AMBULATORY_CARE_PROVIDER_SITE_OTHER): Payer: Medicaid Other | Admitting: Gastroenterology

## 2022-11-11 NOTE — Progress Notes (Unsigned)
Referring:  Leslie Andrea, MD [address] [place],  [place] [address]  PCP: Leslie Andrea, MD  Neurology was asked to evaluate Debbie Santana, a 32 year old femlae for a chief complaint of headaches.  Our recommendations of care will be communicated by shared medical record.    CC:  headaches  History provided from ***  HPI:  Medical co-morbidities: ***  The patient presents for evaluation of headaches which began***  Headache History: Onset: Triggers: Aura: Location: Quality/Description: Associated Symptoms:  Photophobia:  Phonophobia:  Nausea: Vomiting: Allodynia: Other symptoms: Worse with activity?: Duration of headaches:  Headache days per month: *** Migraine days per month: *** Headache free days per month: ***  Current Treatment: Abortive ***  Preventative ***  Prior Therapies                                 Preventive: Topamax 25 mg QHS - insomnia, nightmares Paxil Aimovig 70 mg monthly  Rescue: Imitrex 25 mg PRN Maxalt 10 mg PRN Nurtec 75 mg PRN   LABS: ***  IMAGING:  MRI C-spine 10/25/22: 1. C3-C4 moderate spinal canal stenosis and severe left neural foraminal narrowing. 2. C6-C7 mild to moderate spinal canal stenosis and C4-C5 and C5-C6 mild spinal canal stenosis, in part secondary to short pedicles.    ***Imaging independently reviewed on November 11, 2022   Current Outpatient Medications on File Prior to Visit  Medication Sig Dispense Refill   AIMOVIG 70 MG/ML SOAJ Inject into the skin.     albuterol (VENTOLIN HFA) 108 (90 Base) MCG/ACT inhaler Inhale 2 puffs into the lungs every 6 (six) hours as needed for wheezing or shortness of breath. 1 each 0   baclofen (LIORESAL) 20 MG tablet Take 10-20 mg by mouth every 6 (six) hours as needed.     butalbital-acetaminophen-caffeine (FIORICET) 50-325-40 MG tablet TAKE 1 TABLET BY MOUTH EVERY 6 HOURS AS NEEDED FOR MIGRAINE HEADACHE (Patient taking differently: Take 2 tablets by  mouth 2 (two) times daily as needed for migraine.) 20 tablet 0   clonazePAM (KLONOPIN) 1 MG tablet Take 1 mg by mouth 4 (four) times daily as needed.     diphenhydrAMINE (BENADRYL) 25 MG tablet Take 25 mg by mouth every 6 (six) hours as needed for allergies.     etonogestrel (NEXPLANON) 68 MG IMPL implant 68 mg by Subdermal route once.     fluconazole (DIFLUCAN) 150 MG tablet Take 1 now and 1 in 3 days 2 tablet 1   hyoscyamine (LEVSIN SL) 0.125 MG SL tablet Place 1 tablet (0.125 mg total) under the tongue every 6 (six) hours as needed. 30 tablet 1   NURTEC 75 MG TBDP Take by mouth.     ondansetron (ZOFRAN) 8 MG tablet Take 8 mg by mouth every 8 (eight) hours as needed for nausea or vomiting.     oxyCODONE-acetaminophen (PERCOCET) 10-325 MG tablet SMARTSIG:1 Tablet(s) By Mouth 1-4 Times Daily     sertraline (ZOLOFT) 50 MG tablet Take 200 mg by mouth daily.     SPIRIVA RESPIMAT 2.5 MCG/ACT AERS SMARTSIG:2 Puff(s) Via Inhaler Daily     sucralfate (CARAFATE) 1 g tablet Take 1 tablet (1 g total) by mouth 3 (three) times daily before meals. 90 tablet 3   tiZANidine (ZANAFLEX) 4 MG tablet Take 4 mg by mouth at bedtime.     VRAYLAR 1.5 MG capsule Take 1.5 mg by mouth daily.  No current facility-administered medications on file prior to visit.     Allergies: Allergies  Allergen Reactions   Buspirone    Fluoxetine    Nsaids Other (See Comments)    History of ulcers    Family History: Migraine or other headaches in the family:  *** Aneurysms in a first degree relative:  *** Brain tumors in the family:  *** Other neurological illness in the family:   ***  Past Medical History: Past Medical History:  Diagnosis Date   Anxiety    Bradycardia 04/17/2017   Chest pain on breathing 02/22/2020   Chronic back pain    Depression    Erosive gastritis 04/11/2017   Gastric ulcer    Genital warts    GERD (gastroesophageal reflux disease)    High cholesterol    History of gestational  hypertension 05/21/2018   Dx intrapartum   History of kidney stones    Intertrigo 11/16/2019   Intractable abdominal pain 04/11/2017   Intractable nausea and vomiting 12/07/2018   Intractable vomiting 05/20/2020   Irregular intermenstrual bleeding 02/19/2019   Mental disorder    Migraine without aura and without status migrainosus, not intractable 02/19/2019   Nausea 05/10/2020   Nausea & vomiting 12/07/2018   Nexplanon in place 02/19/2019   Obesity 04/17/2017   Panic disorder    Peptic ulcer disease 04/13/2017   Pinched nerve 09/24/2019   Postpartum hypertension 06/23/2018   requiring norvasc, resolved at pp visit   Sciatica    Tremor of both hands    Vaginal Pap smear, abnormal    Wheezing     Past Surgical History Past Surgical History:  Procedure Laterality Date   BACK SURGERY     ruptured disc.   BACK SURGERY  11/2021   BIOPSY  04/14/2017   Procedure: BIOPSY;  Surgeon: Rogene Houston, MD;  [facility];  Service: Endoscopy;;  gastric   BIOPSY  05/22/2020   Procedure: BIOPSY;  Surgeon: Daneil Dolin, MD;  [facility];  Service: Endoscopy;;   BIOPSY  08/29/2020   Procedure: BIOPSY;  Surgeon: Harvel Quale, MD;  [facility];  Service: Gastroenterology;;   BIOPSY  09/09/2020   Procedure: BIOPSY;  Surgeon: Eloise Harman, DO;  [facility];  Service: Endoscopy;;  duodenum;antral   BIOPSY  11/21/2021   Procedure: BIOPSY;  Surgeon: Rogene Houston, MD;  [facility];  Service: Endoscopy;;   CERVICAL ABLATION N/A 12/02/2018   Procedure: LASER ABLATION OF CERVIX;  Surgeon: Florian Buff, MD;  [facility];  Service: Gynecology;  Laterality: N/A;   CHOLECYSTECTOMY N/A 04/17/2017   Procedure: LAPAROSCOPIC CHOLECYSTECTOMY;  Surgeon: Aviva Signs, MD;  [facility];  Service: General;  Laterality: N/A;   COLONOSCOPY WITH PROPOFOL N/A 08/29/2020   Procedure: COLONOSCOPY WITH PROPOFOL;  Surgeon:  Harvel Quale, MD;  [facility];  Service: Gastroenterology;  Laterality: N/A;  11:15   COLPOSCOPY     ESOPHAGOGASTRODUODENOSCOPY (EGD) WITH PROPOFOL N/A 04/14/2017   Procedure: ESOPHAGOGASTRODUODENOSCOPY (EGD) WITH PROPOFOL;  Surgeon: Rogene Houston, MD;  [facility];  Service: Endoscopy;  Laterality: N/A;   ESOPHAGOGASTRODUODENOSCOPY (EGD) WITH PROPOFOL N/A 05/22/2020   Procedure: ESOPHAGOGASTRODUODENOSCOPY (EGD) WITH PROPOFOL;  Surgeon: Daneil Dolin, MD;  [facility];  Service: Endoscopy;  Laterality: N/A;   ESOPHAGOGASTRODUODENOSCOPY (EGD) WITH PROPOFOL N/A 09/09/2020   Procedure: ESOPHAGOGASTRODUODENOSCOPY (EGD) WITH PROPOFOL;  Surgeon: Eloise Harman, DO;  [facility];  Service: Endoscopy;  Laterality: N/A;   ESOPHAGOGASTRODUODENOSCOPY (EGD) WITH PROPOFOL N/A 11/21/2020   Procedure: ESOPHAGOGASTRODUODENOSCOPY (EGD) WITH PROPOFOL;  Surgeon: Harvel Quale, MD;  [facility];  Service: Gastroenterology;  Laterality: N/A;  Am   ESOPHAGOGASTRODUODENOSCOPY (EGD) WITH PROPOFOL N/A 11/21/2021   Procedure: ESOPHAGOGASTRODUODENOSCOPY (EGD) WITH PROPOFOL;  Surgeon: Rogene Houston, MD;  [facility];  Service: Endoscopy;  Laterality: N/A;  820   HERNIA REPAIR Bilateral    inguinal   LASER ABLATION CONDOLAMATA N/A 12/02/2018   Procedure: LASER ABLATION CONDYLOMA ACCUMINATA LEFT AND RIGHT VULVA, PERINEUM AND PERIANAL (15 TOTAL);  Surgeon: Florian Buff, MD;  [facility];  Service: Gynecology;  Laterality: N/A;   LUMBAR LAMINECTOMY/DECOMPRESSION MICRODISCECTOMY Right 12/03/2021   Procedure: MICRODISCECTOMY Lumbar four- five;  Surgeon: Newman Pies, MD;  Location: [facility];  Service: Neurosurgery;  Laterality: Right;   MOUTH SURGERY  09/2021   All top teeth and all wisdom teeth removed   MOUTH SURGERY  12/2021   POLYPECTOMY  08/29/2020   Procedure: POLYPECTOMY;  Surgeon: Harvel Quale, MD;  [facility];  Service: Gastroenterology;;    Social History: Social History   Tobacco Use   Smoking status: Every Day    Packs/day: 1.00    Years: 10.00    Total pack years: 10.00    Types: Cigarettes    Passive exposure: Current   Smokeless tobacco: Never  Vaping Use   Vaping Use: Former  Substance Use Topics   Alcohol use: No   Drug use: No   ***  ROS: Negative for fevers, chills. Positive for***. All other systems reviewed and negative unless stated otherwise in HPI.   Physical Exam:   Vital Signs: LMP 10/03/2022 (Approximate)  GENERAL: well appearing,in no acute distress,alert SKIN:  Color, texture, turgor normal. No rashes or lesions HEAD:  Normocephalic/atraumatic. CV:  RRR RESP: Normal respiratory effort MSK: no tenderness to palpation over occiput, neck, or shoulders  NEUROLOGICAL: Mental Status: Alert, oriented to person, place and time,Follows commands Cranial Nerves: PERRL, visual fields intact to confrontation, extraocular movements intact, facial sensation intact, no facial droop or ptosis, hearing grossly intact, no dysarthria, palate elevate symmetrically, tongue protrudes midline, shoulder shrug intact and symmetric Motor: muscle strength 5/5 both upper and lower extremities,no drift, normal tone Reflexes: 2+ throughout Sensation: intact to light touch all 4 extremities Coordination: Finger-to- nose-finger intact bilaterally Gait: normal-based   IMPRESSION: ***  PLAN: ***   I spent a total of *** minutes chart reviewing and counseling the patient. Headache education was done. Discussed treatment options including preventive and acute medications, natural supplements, and physical therapy. Discussed medication overuse headache and to limit use of acute treatments to no more than 2 days/week or 10 days/month. Discussed medication side effects, adverse reactions and drug interactions. Written educational materials and patient  instructions outlining all of the above were given.  Follow-up: ***   Genia Harold, MD 11/11/2022   12:44 PM

## 2022-11-12 ENCOUNTER — Encounter: Payer: Self-pay | Admitting: Psychiatry

## 2022-11-12 ENCOUNTER — Ambulatory Visit: Payer: Medicaid Other | Admitting: Psychiatry

## 2022-11-12 VITALS — BP 126/69 | HR 69 | Ht 69.0 in | Wt 189.0 lb

## 2022-11-12 DIAGNOSIS — R251 Tremor, unspecified: Secondary | ICD-10-CM | POA: Diagnosis not present

## 2022-11-12 DIAGNOSIS — R270 Ataxia, unspecified: Secondary | ICD-10-CM | POA: Diagnosis not present

## 2022-11-12 MED ORDER — PROPRANOLOL HCL 10 MG PO TABS: 10.0000 mg | ORAL_TABLET | Freq: Two times a day (BID) | ORAL | 6 refills | Status: DC

## 2022-11-18 ENCOUNTER — Other Ambulatory Visit: Payer: Self-pay | Admitting: Adult Health

## 2022-11-18 MED ORDER — FLUCONAZOLE 150 MG PO TABS: ORAL_TABLET | ORAL | 1 refills | Status: DC

## 2022-11-19 ENCOUNTER — Telehealth: Payer: Self-pay | Admitting: Psychiatry

## 2022-11-19 DIAGNOSIS — M542 Cervicalgia: Secondary | ICD-10-CM | POA: Diagnosis not present

## 2022-11-19 DIAGNOSIS — M4722 Other spondylosis with radiculopathy, cervical region: Secondary | ICD-10-CM | POA: Diagnosis not present

## 2022-11-19 DIAGNOSIS — M5116 Intervertebral disc disorders with radiculopathy, lumbar region: Secondary | ICD-10-CM | POA: Diagnosis not present

## 2022-11-19 NOTE — Telephone Encounter (Signed)
[facility] Oren Binet [id number] exp. 11/19/22-01/17/23 sent to [facility] [phone]

## 2022-11-26 DIAGNOSIS — F330 Major depressive disorder, recurrent, mild: Secondary | ICD-10-CM | POA: Diagnosis not present

## 2022-11-26 DIAGNOSIS — M545 Low back pain, unspecified: Secondary | ICD-10-CM | POA: Diagnosis not present

## 2022-11-26 DIAGNOSIS — G4700 Insomnia, unspecified: Secondary | ICD-10-CM | POA: Diagnosis not present

## 2022-11-26 DIAGNOSIS — F17210 Nicotine dependence, cigarettes, uncomplicated: Secondary | ICD-10-CM | POA: Diagnosis not present

## 2022-11-28 ENCOUNTER — Other Ambulatory Visit (HOSPITAL_COMMUNITY): Payer: Self-pay | Admitting: Neurosurgery

## 2022-11-28 DIAGNOSIS — M5116 Intervertebral disc disorders with radiculopathy, lumbar region: Secondary | ICD-10-CM

## 2022-12-09 ENCOUNTER — Encounter: Payer: Self-pay | Admitting: Women's Health

## 2022-12-09 ENCOUNTER — Ambulatory Visit: Payer: Medicaid Other | Admitting: Women's Health

## 2022-12-09 VITALS — BP 97/57 | HR 74 | Ht 68.0 in | Wt 192.2 lb

## 2022-12-09 DIAGNOSIS — N751 Abscess of Bartholin's gland: Secondary | ICD-10-CM

## 2022-12-09 MED ORDER — SULFAMETHOXAZOLE-TRIMETHOPRIM 800-160 MG PO TABS: 1.0000 | ORAL_TABLET | Freq: Two times a day (BID) | ORAL | 0 refills | Status: DC

## 2022-12-09 NOTE — Progress Notes (Signed)
GYN VISIT Patient name: Debbie Santana MRN [id number]  Date of birth: 09-26-1990 Chief Complaint:   Bartholin's Cyst  History of Present Illness:   Debbie Santana is a 32 y.o. G89P1011 Caucasian female being seen today for Lt Bartholin's abscess x 1wk, hasn't been anywhere or tried anything. Has had before.    Patient's last menstrual period was 12/08/2022. Last pap 06/13/22. Results were: ASCUS w/ HRHPV negative     06/13/2022    2:50 PM 09/05/2021   10:42 AM 10/31/2020    1:37 PM 09/14/2020   10:05 AM 08/15/2020    9:27 AM  Depression screen PHQ 2/9  Decreased Interest 3 1 2  0 1  Down, Depressed, Hopeless 3 2 3  0 1  PHQ - 2 Score 6 3 5  0 2  Altered sleeping 2 3 2  0 1  Tired, decreased energy 3 3 1 1 1   Change in appetite 3 2 3 1 1   Feeling bad or failure about yourself  3 3 3  0 0  Trouble concentrating 3 3 3 1 1   Moving slowly or fidgety/restless 0 0 0 0 0  Suicidal thoughts 0  0 0 0  PHQ-9 Score 20 17 17 3 6   Difficult doing work/chores    Not difficult at all Somewhat difficult        06/13/2022    2:51 PM 09/05/2021   10:42 AM 10/31/2020    1:38 PM 08/10/2020   10:40 AM  GAD 7 : Generalized Anxiety Score  Nervous, Anxious, on Edge 3 3 3 3   Control/stop worrying 3 3 3 3   Worry too much - different things 3 3 3 3   Trouble relaxing 3 3 3 3   Restless 0 3 3 3   Easily annoyed or irritable 3 3 3 3   Afraid - awful might happen 3 3 2 1   Total GAD 7 Score 18 21 20 19   Anxiety Difficulty    Extremely difficult     Review of Systems:   Pertinent items are noted in HPI Denies fever/chills, dizziness, headaches, visual disturbances, fatigue, shortness of breath, chest pain, abdominal pain, vomiting, abnormal vaginal discharge/itching/odor/irritation, problems with periods, bowel movements, urination, or intercourse unless otherwise stated above.  Pertinent History Reviewed:  Reviewed past medical,surgical, social, obstetrical and family history.  Reviewed problem list,  medications and allergies. Physical Assessment:   Vitals:   12/09/22 1327  BP: (!) 97/57  Pulse: 74  Weight: 192 lb 3.2 oz (87.2 kg)  Height: 5\' 8"  (1.727 m)  Body mass index is 29.22 kg/m.       Physical Examination:   General appearance: alert, well appearing, and in no distress  Mental status: alert, oriented to person, place, and time  Skin: warm & dry   Cardiovascular: normal heart rate noted  Respiratory: normal respiratory effort, no distress  Abdomen: soft, non-tender   Pelvic: Large firm tender ~5cm Lt Bartholin's abscess  Extremities: no edema   Chaperone: Celene Squibb    No results found for this or any previous visit (from the past 24 hour(s)).  Assessment & Plan:  1) Large Lt Bartholin's Abscess> rx Septra DS, f/u asap w/ MD for I&D  Meds:  Meds ordered this encounter  Medications   sulfamethoxazole-trimethoprim (BACTRIM DS) 800-160 MG tablet    Sig: Take 1 tablet by mouth 2 (two) times daily. X 7 days    Dispense:  14 tablet    Refill:  0    No orders of the defined  types were placed in this encounter.   Return for tomorrow or Thurs w/ Dr. Elonda Husky only for Bartholin's I&D.  [place], [facility] 12/09/2022 1:54 PM

## 2022-12-10 ENCOUNTER — Encounter: Payer: Self-pay | Admitting: Obstetrics & Gynecology

## 2022-12-10 ENCOUNTER — Ambulatory Visit: Payer: Medicaid Other | Admitting: Obstetrics & Gynecology

## 2022-12-10 VITALS — BP 98/53 | HR 97 | Ht 68.0 in | Wt 192.0 lb

## 2022-12-10 DIAGNOSIS — N751 Abscess of Bartholin's gland: Secondary | ICD-10-CM | POA: Diagnosis not present

## 2022-12-10 NOTE — Progress Notes (Signed)
Pre op diagnosis:  Bartholin gland abscess, recurrent    Post op Diagnosis: SAME   Procedure:  I&D left Bartholin abscess   Surgeon:  Florian Buff, MD   4.5 cm left Bartholin abscess was anesthetized with 4 cc 1% lidocain 11 blade used to make stab incision Drained copious purulent fluid Word catheter 2.5 cc saline placed Will remove in 2 weeks Continue on bactrim ds            Florian Buff, MD  12/10/2022 12:26 PM

## 2022-12-12 ENCOUNTER — Ambulatory Visit (HOSPITAL_COMMUNITY): Payer: Medicaid Other | Admitting: Physical Therapy

## 2022-12-16 ENCOUNTER — Other Ambulatory Visit: Payer: Self-pay

## 2022-12-16 ENCOUNTER — Inpatient Hospital Stay (HOSPITAL_COMMUNITY)
Admission: EM | Admit: 2022-12-16 | Discharge: 2022-12-19 | DRG: 206 | Disposition: A | Discharge status: Home / Self Care | Payer: Medicaid Other | Attending: Internal Medicine | Admitting: Internal Medicine

## 2022-12-16 ENCOUNTER — Emergency Department (HOSPITAL_COMMUNITY): Payer: Medicaid Other

## 2022-12-16 ENCOUNTER — Encounter (HOSPITAL_COMMUNITY): Payer: Self-pay | Admitting: *Deleted

## 2022-12-16 DIAGNOSIS — M545 Low back pain, unspecified: Secondary | ICD-10-CM | POA: Diagnosis present

## 2022-12-16 DIAGNOSIS — Z91048 Other nonmedicinal substance allergy status: Secondary | ICD-10-CM

## 2022-12-16 DIAGNOSIS — E7800 Pure hypercholesterolemia, unspecified: Secondary | ICD-10-CM | POA: Diagnosis not present

## 2022-12-16 DIAGNOSIS — K219 Gastro-esophageal reflux disease without esophagitis: Secondary | ICD-10-CM | POA: Diagnosis present

## 2022-12-16 DIAGNOSIS — F419 Anxiety disorder, unspecified: Secondary | ICD-10-CM | POA: Diagnosis not present

## 2022-12-16 DIAGNOSIS — Z87442 Personal history of urinary calculi: Secondary | ICD-10-CM

## 2022-12-16 DIAGNOSIS — J209 Acute bronchitis, unspecified: Secondary | ICD-10-CM | POA: Diagnosis present

## 2022-12-16 DIAGNOSIS — F1910 Other psychoactive substance abuse, uncomplicated: Secondary | ICD-10-CM | POA: Diagnosis not present

## 2022-12-16 DIAGNOSIS — Z9049 Acquired absence of other specified parts of digestive tract: Secondary | ICD-10-CM

## 2022-12-16 DIAGNOSIS — F12188 Cannabis abuse with other cannabis-induced disorder: Secondary | ICD-10-CM | POA: Diagnosis present

## 2022-12-16 DIAGNOSIS — Z888 Allergy status to other drugs, medicaments and biological substances status: Secondary | ICD-10-CM

## 2022-12-16 DIAGNOSIS — D649 Anemia, unspecified: Secondary | ICD-10-CM | POA: Diagnosis present

## 2022-12-16 DIAGNOSIS — K259 Gastric ulcer, unspecified as acute or chronic, without hemorrhage or perforation: Secondary | ICD-10-CM | POA: Diagnosis not present

## 2022-12-16 DIAGNOSIS — R1013 Epigastric pain: Secondary | ICD-10-CM

## 2022-12-16 DIAGNOSIS — N750 Cyst of Bartholin's gland: Secondary | ICD-10-CM | POA: Diagnosis not present

## 2022-12-16 DIAGNOSIS — K3189 Other diseases of stomach and duodenum: Secondary | ICD-10-CM | POA: Diagnosis present

## 2022-12-16 DIAGNOSIS — Z720 Tobacco use: Secondary | ICD-10-CM | POA: Diagnosis present

## 2022-12-16 DIAGNOSIS — K2100 Gastro-esophageal reflux disease with esophagitis, without bleeding: Secondary | ICD-10-CM | POA: Diagnosis present

## 2022-12-16 DIAGNOSIS — R1030 Lower abdominal pain, unspecified: Secondary | ICD-10-CM | POA: Diagnosis not present

## 2022-12-16 DIAGNOSIS — Z818 Family history of other mental and behavioral disorders: Secondary | ICD-10-CM

## 2022-12-16 DIAGNOSIS — D72829 Elevated white blood cell count, unspecified: Secondary | ICD-10-CM | POA: Diagnosis not present

## 2022-12-16 DIAGNOSIS — R519 Headache, unspecified: Secondary | ICD-10-CM | POA: Diagnosis not present

## 2022-12-16 DIAGNOSIS — E876 Hypokalemia: Secondary | ICD-10-CM | POA: Diagnosis not present

## 2022-12-16 DIAGNOSIS — Z7951 Long term (current) use of inhaled steroids: Secondary | ICD-10-CM | POA: Diagnosis not present

## 2022-12-16 DIAGNOSIS — F32A Depression, unspecified: Secondary | ICD-10-CM | POA: Diagnosis not present

## 2022-12-16 DIAGNOSIS — R112 Nausea with vomiting, unspecified: Secondary | ICD-10-CM | POA: Diagnosis not present

## 2022-12-16 DIAGNOSIS — D72823 Leukemoid reaction: Secondary | ICD-10-CM | POA: Diagnosis not present

## 2022-12-16 DIAGNOSIS — G894 Chronic pain syndrome: Secondary | ICD-10-CM | POA: Diagnosis present

## 2022-12-16 DIAGNOSIS — Z793 Long term (current) use of hormonal contraceptives: Secondary | ICD-10-CM

## 2022-12-16 DIAGNOSIS — F418 Other specified anxiety disorders: Secondary | ICD-10-CM | POA: Diagnosis not present

## 2022-12-16 DIAGNOSIS — Z83438 Family history of other disorder of lipoprotein metabolism and other lipidemia: Secondary | ICD-10-CM

## 2022-12-16 DIAGNOSIS — K2960 Other gastritis without bleeding: Secondary | ICD-10-CM | POA: Diagnosis present

## 2022-12-16 DIAGNOSIS — F17210 Nicotine dependence, cigarettes, uncomplicated: Secondary | ICD-10-CM | POA: Diagnosis not present

## 2022-12-16 DIAGNOSIS — Z833 Family history of diabetes mellitus: Secondary | ICD-10-CM

## 2022-12-16 DIAGNOSIS — Z79891 Long term (current) use of opiate analgesic: Secondary | ICD-10-CM | POA: Diagnosis not present

## 2022-12-16 DIAGNOSIS — Z79899 Other long term (current) drug therapy: Secondary | ICD-10-CM

## 2022-12-16 DIAGNOSIS — U070 Vaping-related disorder: Secondary | ICD-10-CM | POA: Diagnosis not present

## 2022-12-16 DIAGNOSIS — Z886 Allergy status to analgesic agent status: Secondary | ICD-10-CM

## 2022-12-16 DIAGNOSIS — Z8711 Personal history of peptic ulcer disease: Secondary | ICD-10-CM

## 2022-12-16 LAB — URINALYSIS, ROUTINE W REFLEX MICROSCOPIC
Bacteria, UA: NONE SEEN
Bilirubin Urine: NEGATIVE
Glucose, UA: NEGATIVE mg/dL
Hgb urine dipstick: NEGATIVE
Ketones, ur: 5 mg/dL — AB
Nitrite: NEGATIVE
Protein, ur: NEGATIVE mg/dL
Specific Gravity, Urine: 1.021 (ref 1.005–1.030)
pH: 6 (ref 5.0–8.0)

## 2022-12-16 LAB — COMPREHENSIVE METABOLIC PANEL
ALT: 12 U/L (ref 0–44)
AST: 16 U/L (ref 15–41)
Albumin: 4.3 g/dL (ref 3.5–5.0)
Alkaline Phosphatase: 67 U/L (ref 38–126)
Anion gap: 8 (ref 5–15)
BUN: 8 mg/dL (ref 6–20)
CO2: 22 mmol/L (ref 22–32)
Calcium: 9.5 mg/dL (ref 8.9–10.3)
Chloride: 106 mmol/L (ref 98–111)
Creatinine, Ser: 0.81 mg/dL (ref 0.44–1.00)
GFR, Estimated: >60 mL/min (ref >60)
Glucose, Bld: 140 mg/dL — ABNORMAL HIGH (ref 70–99)
Potassium: 3.9 mmol/L (ref 3.5–5.1)
Sodium: 136 mmol/L (ref 135–145)
Total Bilirubin: 0.5 mg/dL (ref 0.3–1.2)
Total Protein: 7.6 g/dL (ref 6.5–8.1)

## 2022-12-16 LAB — CBC WITH DIFFERENTIAL/PLATELET
Abs Immature Granulocytes: 0.08 10*3/uL — ABNORMAL HIGH (ref 0.00–0.07)
Basophils Absolute: 0 10*3/uL (ref 0.0–0.1)
Basophils Relative: 0 %
Eosinophils Absolute: 0 10*3/uL (ref 0.0–0.5)
Eosinophils Relative: 0 %
HCT: 43.4 % (ref 36.0–46.0)
Hemoglobin: 15.1 g/dL — ABNORMAL HIGH (ref 12.0–15.0)
Immature Granulocytes: 0 %
Lymphocytes Relative: 11 %
Lymphs Abs: 2 10*3/uL (ref 0.7–4.0)
MCH: 32.9 pg (ref 26.0–34.0)
MCHC: 34.8 g/dL (ref 30.0–36.0)
MCV: 94.6 fL (ref 80.0–100.0)
Monocytes Absolute: 0.3 10*3/uL (ref 0.1–1.0)
Monocytes Relative: 2 %
Neutro Abs: 15.8 10*3/uL — ABNORMAL HIGH (ref 1.7–7.7)
Neutrophils Relative %: 87 %
Platelets: 248 10*3/uL (ref 150–400)
RBC: 4.59 MIL/uL (ref 3.87–5.11)
RDW: 12 % (ref 11.5–15.5)
WBC: 18.2 10*3/uL — ABNORMAL HIGH (ref 4.0–10.5)
nRBC: 0 % (ref 0.0–0.2)

## 2022-12-16 LAB — RAPID URINE DRUG SCREEN, HOSP PERFORMED
Amphetamines: NOT DETECTED
Barbiturates: NOT DETECTED
Benzodiazepines: POSITIVE — AB
Cocaine: NOT DETECTED
Opiates: POSITIVE — AB
Tetrahydrocannabinol: POSITIVE — AB

## 2022-12-16 LAB — PROTIME-INR
INR: 1.1 (ref 0.8–1.2)
Prothrombin Time: 13.7 seconds (ref 11.4–15.2)

## 2022-12-16 LAB — PREGNANCY, URINE: Preg Test, Ur: NEGATIVE

## 2022-12-16 LAB — LIPASE, BLOOD: Lipase: 24 U/L (ref 11–51)

## 2022-12-16 MED ORDER — SODIUM CHLORIDE 0.9 % IV BOLUS
1000.0000 mL | Freq: Once | INTRAVENOUS | Status: AC
  Administered 2022-12-16: 1000 mL via INTRAVENOUS

## 2022-12-16 MED ORDER — ONDANSETRON HCL 4 MG/2ML IJ SOLN
4.0000 mg | Freq: Once | INTRAMUSCULAR | Status: AC
  Administered 2022-12-16: 4 mg via INTRAVENOUS
  Filled 2022-12-16: qty 2

## 2022-12-16 MED ORDER — SODIUM CHLORIDE 0.9 % IV SOLN
12.5000 mg | Freq: Once | INTRAVENOUS | Status: AC
  Administered 2022-12-16: 12.5 mg via INTRAVENOUS
  Filled 2022-12-16: qty 0.5

## 2022-12-16 MED ORDER — IOHEXOL 300 MG/ML  SOLN
100.0000 mL | Freq: Once | INTRAMUSCULAR | Status: AC | PRN
  Administered 2022-12-16: 100 mL via INTRAVENOUS

## 2022-12-16 MED ORDER — DROPERIDOL 2.5 MG/ML IJ SOLN
1.2500 mg | Freq: Once | INTRAMUSCULAR | Status: AC
  Administered 2022-12-16: 1.25 mg via INTRAVENOUS
  Filled 2022-12-16: qty 2

## 2022-12-16 MED ORDER — FAMOTIDINE IN NACL 20-0.9 MG/50ML-% IV SOLN
20.0000 mg | Freq: Once | INTRAVENOUS | Status: AC
  Administered 2022-12-16: 20 mg via INTRAVENOUS
  Filled 2022-12-16: qty 50

## 2022-12-16 MED ORDER — HYDROMORPHONE HCL 1 MG/ML IJ SOLN
1.0000 mg | Freq: Once | INTRAMUSCULAR | Status: AC
  Administered 2022-12-16: 1 mg via INTRAVENOUS
  Filled 2022-12-16: qty 1

## 2022-12-16 MED ORDER — PROMETHAZINE HCL 25 MG/ML IJ SOLN
INTRAMUSCULAR | Status: AC
  Filled 2022-12-16: qty 1

## 2022-12-16 MED ORDER — METOCLOPRAMIDE HCL 5 MG/ML IJ SOLN
5.0000 mg | Freq: Once | INTRAMUSCULAR | Status: AC
  Administered 2022-12-16: 5 mg via INTRAVENOUS
  Filled 2022-12-16: qty 2

## 2022-12-16 MED ORDER — PANTOPRAZOLE SODIUM 40 MG IV SOLR
40.0000 mg | Freq: Once | INTRAVENOUS | Status: AC
  Administered 2022-12-16: 40 mg via INTRAVENOUS
  Filled 2022-12-16: qty 10

## 2022-12-16 MED ORDER — MORPHINE SULFATE (PF) 4 MG/ML IV SOLN
4.0000 mg | Freq: Once | INTRAVENOUS | Status: AC
  Administered 2022-12-16: 4 mg via INTRAVENOUS
  Filled 2022-12-16: qty 1

## 2022-12-16 MED ORDER — DROPERIDOL 2.5 MG/ML IJ SOLN
2.5000 mg | Freq: Once | INTRAMUSCULAR | Status: AC
  Administered 2022-12-16: 2.5 mg via INTRAVENOUS
  Filled 2022-12-16: qty 2

## 2022-12-16 NOTE — ED Provider Triage Note (Signed)
Emergency Medicine Provider Triage Evaluation Note  Debbie Santana , a 32 y.o. female  was evaluated in triage.  Pt complains of lower abdominal pain, low back pain, nausea and vomiting.  Patient states that she had a Bartholin cyst and had a drain tube placed in her vagina last week.  She recently began having diffuse mostly lower abdominal pain with pain to her back.  Episode of vomiting this morning.  Denies fever or chills.  No dysuria or flank pain.  Possible constipation as well  Review of Systems  Positive: Abdominal pain, vomiting, low back pain Negative: Fever, chills, diarrhea dysuria tearful  Physical Exam  BP 129/81 (BP Location: Left Arm)   Pulse 85   Temp 97.7 F (36.5 C) (Oral)   Resp 18   Ht 5\' 8"  (1.727 m)   Wt 87 kg   LMP 12/08/2022   SpO2 100%   BMI 29.16 kg/m  Gen:   Awake,  tearful, appears anxious Resp:  Normal effort  MSK:   Moves extremities without difficulty  Other:  Lower abdominal pain  Medical Decision Making  Medically screening exam initiated at 1:21 PM.  Appropriate orders placed.  [name] was informed that the remainder of the evaluation will be completed by another provider, this initial triage assessment does not replace that evaluation, and the importance of remaining in the ED until their evaluation is complete.     Kem Parkinson, PA-C 12/16/22 1322

## 2022-12-16 NOTE — ED Provider Notes (Signed)
[facility] Provider Note   CSN: [id number] Arrival date & time: 12/16/22  1209     History  Chief Complaint  Patient presents with   Abdominal Pain    Debbie Santana is a 32 y.o. female.  With a significant history including but not limited to gastric ulcers, erosive gastritis, chronic back pain, anxiety, depression, hypertension, panic disorder who presents to the ED for evaluation of abdominal pain, nausea and vomiting.  Symptoms began this morning.  Localizes the pain to the epigastric region without radiation. States she has low back pain as well.  She denies dysuria, frequency, urgency, hematuria, fevers, chest pain, shortness of breath, pelvic pain.  She is a 1.5 pack/day smoker.  Denies alcohol or recreational drug use.  States her anxiety is very high right now.  She reports history of constipation but had a bowel movement last night and this morning.  She denies melena, hematochezia, hematemesis.  She has been on antibiotics multiple times for her peptic ulcer disease.  Currently has a word catheter in place for Bartholin gland cyst emergency department.  She is scheduled for removal of this later this week.    Abdominal Pain Associated symptoms: nausea and vomiting        Home Medications Prior to Admission medications   Medication Sig Start Date End Date Taking? Authorizing Provider  AIMOVIG 70 MG/ML SOAJ Inject into the skin. 10/16/22   [provider]  albuterol (VENTOLIN HFA) 108 (90 Base) MCG/ACT inhaler Inhale 2 puffs into the lungs every 6 (six) hours as needed for wheezing or shortness of breath. 09/11/20   Orson Eva, MD  baclofen (LIORESAL) 20 MG tablet Take 10-20 mg by mouth every 6 (six) hours as needed. 08/14/22   [provider]  butalbital-acetaminophen-caffeine (FIORICET) 50-325-40 MG tablet TAKE 1 TABLET BY MOUTH EVERY 6 HOURS AS NEEDED FOR MIGRAINE HEADACHE Patient taking differently: Take 2  tablets by mouth 2 (two) times daily as needed for migraine. 12/04/20   Estill Dooms, NP  clonazePAM (KLONOPIN) 1 MG tablet Take 1 mg by mouth 4 (four) times daily as needed. 10/23/22   [provider]  diphenhydrAMINE (BENADRYL) 25 MG tablet Take 25 mg by mouth every 6 (six) hours as needed for allergies.    [provider]  etonogestrel (NEXPLANON) 68 MG IMPL implant 68 mg by Subdermal route once.    [provider]  hyoscyamine (LEVSIN SL) 0.125 MG SL tablet Place 1 tablet (0.125 mg total) under the tongue every 6 (six) hours as needed. 05/07/22   Carlan, Deatra Robinson, NP  NURTEC 75 MG TBDP Take by mouth. 10/23/22   [provider]  ondansetron (ZOFRAN) 8 MG tablet Take 8 mg by mouth every 8 (eight) hours as needed for nausea or vomiting. 08/21/21   [provider]  oxyCODONE-acetaminophen (PERCOCET) 10-325 MG tablet SMARTSIG:1 Tablet(s) By Mouth 1-4 Times Daily 06/05/22   [provider]  propranolol (INDERAL) 10 MG tablet Take 1 tablet (10 mg total) by mouth 2 (two) times daily. 11/12/22   Genia Harold, MD  sertraline (ZOLOFT) 50 MG tablet Take 200 mg by mouth daily.    [provider]  SPIRIVA RESPIMAT 2.5 MCG/ACT AERS SMARTSIG:2 Puff(s) Via Inhaler Daily 09/24/22   [provider]  sucralfate (CARAFATE) 1 g tablet Take 1 tablet (1 g total) by mouth 3 (three) times daily before meals. 11/06/21   Carlan, Deatra Robinson, NP  sulfamethoxazole-trimethoprim (BACTRIM DS) 800-160  MG tablet Take 1 tablet by mouth 2 (two) times daily. X 7 days 12/09/22   Roma Schanz, CNM  tiZANidine (ZANAFLEX) 4 MG tablet Take 4 mg by mouth at bedtime. 10/23/22   [provider]  VRAYLAR 1.5 MG capsule Take 1.5 mg by mouth daily. 09/27/22   [provider]      Allergies    Buspirone, Fluoxetine, and Nsaids    Review of Systems   Review of Systems  Gastrointestinal:  Positive for abdominal pain, nausea and vomiting.  All other  systems reviewed and are negative.   Physical Exam Updated Vital Signs BP (!) 121/58 (BP Location: Left Arm)   Pulse 64   Temp 97.6 F (36.4 C) (Oral)   Resp 16   Ht 5\' 8"  (1.727 m)   Wt 87 kg   LMP 12/08/2022   SpO2 99%   BMI 29.16 kg/m  Physical Exam Vitals and nursing note reviewed.  Constitutional:      General: She is in acute distress.     Appearance: She is well-developed. She is not ill-appearing, toxic-appearing or diaphoretic.  HENT:     Head: Normocephalic and atraumatic.  Eyes:     Conjunctiva/sclera: Conjunctivae normal.  Cardiovascular:     Rate and Rhythm: Normal rate and regular rhythm.     Heart sounds: No murmur heard. Pulmonary:     Effort: Pulmonary effort is normal. No respiratory distress.     Breath sounds: Normal breath sounds.  Abdominal:     General: Abdomen is flat. Bowel sounds are normal. There is no distension.     Palpations: Abdomen is soft.     Tenderness: There is generalized abdominal tenderness and tenderness in the epigastric area. There is no right CVA tenderness, left CVA tenderness or guarding. Negative signs include Murphy's sign, McBurney's sign and obturator sign.     Comments: No CVA tenderness bilaterally  Musculoskeletal:        General: No swelling.     Cervical back: Neck supple.  Skin:    General: Skin is warm and dry.     Capillary Refill: Capillary refill takes less than 2 seconds.  Neurological:     General: No focal deficit present.     Mental Status: She is alert and oriented to person, place, and time.  Psychiatric:        Mood and Affect: Mood is anxious.     ED Results / Procedures / Treatments   Labs (all labs ordered are listed, but only abnormal results are displayed) Labs Reviewed  CBC WITH DIFFERENTIAL/PLATELET - Abnormal; Notable for the following components:      Result Value   WBC 18.2 (*)    Hemoglobin 15.1 (*)    Neutro Abs 15.8 (*)    Abs Immature Granulocytes 0.08 (*)    All other  components within normal limits  COMPREHENSIVE METABOLIC PANEL - Abnormal; Notable for the following components:   Glucose, Bld 140 (*)    All other components within normal limits  URINALYSIS, ROUTINE W REFLEX MICROSCOPIC - Abnormal; Notable for the following components:   APPearance HAZY (*)    Ketones, ur 5 (*)    Leukocytes,Ua SMALL (*)    All other components within normal limits  PREGNANCY, URINE  LIPASE, BLOOD  PROTIME-INR  RAPID URINE DRUG SCREEN, HOSP PERFORMED    EKG None  Radiology CT ABDOMEN PELVIS W CONTRAST  Result Date: 12/16/2022 CLINICAL DATA:  Lower abdominal pain, known Bartholin's cyst. EXAM:  CT ABDOMEN AND PELVIS WITH CONTRAST TECHNIQUE: Multidetector CT imaging of the abdomen and pelvis was performed using the standard protocol following bolus administration of intravenous contrast. RADIATION DOSE REDUCTION: This exam was performed according to the departmental dose-optimization program which includes automated exposure control, adjustment of the mA and/or kV according to patient size and/or use of iterative reconstruction technique. CONTRAST:  112mL OMNIPAQUE IOHEXOL 300 MG/ML  SOLN COMPARISON:  CT abdomen pelvis 06/20/2022 FINDINGS: Lower chest: No acute abnormality. Hepatobiliary: No focal liver abnormality is seen. Status post cholecystectomy. No new biliary dilatation. Pancreas: Unremarkable. No pancreatic ductal dilatation or surrounding inflammatory changes. Spleen: Normal in size without focal abnormality. Adrenals/Urinary Tract: Adrenal glands are unremarkable. Kidneys are normal, without renal calculi, focal lesion, or hydronephrosis. Bladder is unremarkable. Stomach/Bowel: Stomach is within normal limits. Appendix appears normal. No evidence of bowel wall thickening, distention, or inflammatory changes. Vascular/Lymphatic: No significant vascular findings are present. No enlarged abdominal or pelvic lymph nodes. Reproductive: Uterus and bilateral adnexa are  unremarkable. There are two adjacent left Bartholin's gland cysts, the more superior and larger cyst measures 2.8 x 2.2 cm (series 2, image 85). The smaller and more inferior cyst measures 1.6 x 1.5 cm (series 2, image 89). A catheter appears to be draining the more inferior cyst. Other: No abdominal wall hernia or abnormality. No abdominopelvic ascites. Musculoskeletal: No acute or significant osseous findings. IMPRESSION: 1. No acute intra-abdominal pathology. 2. Two adjacent left Bartholin's gland cysts, the more superior and larger cyst measures 2.8 x 2.2 cm. The smaller and more inferior cyst measures 1.6 x 1.5 cm. A catheter appears to be draining the more inferior cyst. Electronically Signed   By: Audie Pinto M.D.   On: 12/16/2022 17:41    Procedures Procedures    Medications Ordered in ED Medications  pantoprazole (PROTONIX) injection 40 mg (has no administration in time range)  droperidol (INAPSINE) 2.5 MG/ML injection 2.5 mg (has no administration in time range)  sodium chloride 0.9 % bolus 1,000 mL (0 mLs Intravenous Stopped 12/16/22 1517)  ondansetron (ZOFRAN) injection 4 mg (4 mg Intravenous Given 12/16/22 1417)  morphine (PF) 4 MG/ML injection 4 mg (4 mg Intravenous Given 12/16/22 1417)  famotidine (PEPCID) IVPB 20 mg premix (0 mg Intravenous Stopped 12/16/22 1513)  droperidol (INAPSINE) 2.5 MG/ML injection 1.25 mg (1.25 mg Intravenous Given 12/16/22 1633)  iohexol (OMNIPAQUE) 300 MG/ML solution 100 mL (100 mLs Intravenous Contrast Given 12/16/22 1731)  metoCLOPramide (REGLAN) injection 5 mg (5 mg Intravenous Given 12/16/22 1757)  HYDROmorphone (DILAUDID) injection 1 mg (1 mg Intravenous Given 12/16/22 1757)  promethazine (PHENERGAN) 12.5 mg in sodium chloride 0.9 % 50 mL IVPB (0 mg Intravenous Stopped 12/16/22 2115)    ED Course/ Medical Decision Making/ A&P Clinical Course as of 12/16/22 2219  Mon Dec 16, 2022  1749 Patient states that her symptoms improved until she went to CT.   Her nausea and epigastric pain have returned. [AS]  2142 Patient reports continued nausea and vomiting after 4 rounds of antiemetics.  Is requesting admission [AS]    Clinical Course User Index [AS] Tomoko Sandra, Grafton Folk, PA-C                             Medical Decision Making Amount and/or Complexity of Data Reviewed Labs: ordered.  Risk Prescription drug management. Decision regarding hospitalization.  This patient presents to the ED for concern of abdominal pain, this involves an extensive number of treatment  options, and is a complaint that carries with it a high risk of complications and morbidity.  The differential diagnosis for generalized abdominal pain includes, but is not limited to AAA, gastroenteritis, appendicitis, Bowel obstruction, Bowel perforation. Gastroparesis, DKA, Hernia, Inflammatory bowel disease, mesenteric ischemia, pancreatitis, peritonitis SBP, volvulus.   Co morbidities that complicate the patient evaluation  significant history including but not limited to gastric ulcers, erosive gastritis, chronic back pain, anxiety, depression, hypertension, panic disorder  My initial workup includes abdominal pain labs, fluids, pain control, CT abdomen pelvis  Additional history obtained from: Nursing notes from this visit. Family mother is present and provides a portion of the history  I ordered, reviewed and interpreted labs which include: CBC, CMP, INR, lipase, urinalysis, urine pregnancy.  Leukocytosis of 18.2 with a hemoglobin of 15.1  I ordered imaging studies including CT abdomen pelvis I independently visualized and interpreted imaging which showed negative for acute abnormalities I agree with the radiologist interpretation  Consultations Obtained:  I requested consultation with the hospitalist Dr. Josephine Cables,  and discussed lab and imaging findings as well as pertinent plan - they recommend: Admission  Afebrile, hemodynamically stable.  32 year old female  presenting to the ED for evaluation of epigastric abdominal pain, nausea and vomiting.  She had numerous rounds of antiemetics and pain medications in the ED and reported continued abdominal pain, nausea and vomiting.  She believes this is likely secondary to her peptic ulcer disease like it was during previous admissions.  On exam, she has epigastric abdominal TTP and appears uncomfortable.  P.o. trial was attempted but failed after her fourth round of antiemetics.  CT abdomen pelvis was reassuring.  Labs with a leukocytosis of 18.2 which is concerning, however patient is afebrile and without tachycardia.  Patient will be admitted for intractable nausea and vomiting.  Patient's case discussed with Dr. Philip Aspen who agrees with plan to admit.   Note: Portions of this report may have been transcribed using voice recognition software. Every effort was made to ensure accuracy; however, inadvertent computerized transcription errors may still be present.        Final Clinical Impression(s) / ED Diagnoses Final diagnoses:  Intractable nausea and vomiting  Epigastric pain    Rx / DC Orders ED Discharge Orders     None         Nehemiah Massed 12/16/22 2219    Godfrey Pick, MD 12/17/22 [phone]

## 2022-12-16 NOTE — ED Triage Notes (Signed)
Pt c/o abdominal pain with vomiting; pt has drain tube to vagina and is to have it removed next week

## 2022-12-16 NOTE — ED Notes (Signed)
Pt ambulated to the bathroom without assistance. Provided warm blankets upon return to room.

## 2022-12-16 NOTE — ED Notes (Signed)
ED TO INPATIENT HANDOFF REPORT  ED Nurse Name and Phone #: Eliezer Lofts Evander Macaraeg  S Name/Age/Gender Debbie Santana 32 y.o. female Room/Bed: APA18/APA18  Code Status   Code Status: Prior  Home/SNF/Other Home Patient oriented to: self, place, time, and situation Is this baseline? Yes   Triage Complete: Triage complete  Chief Complaint Intractable nausea and vomiting [R11.2]  Triage Note Pt c/o abdominal pain with vomiting; pt has drain tube to vagina and is to have it removed next week   Allergies Allergies  Allergen Reactions   Buspirone    Fluoxetine    Nsaids Other (See Comments)    History of ulcers    Level of Care/Admitting Diagnosis ED Disposition     ED Disposition  Admit   Condition  --   [place]: [facility] [id number]  Level of Care: Med-Surg [16]  Covid Evaluation: Asymptomatic - no recent exposure (last 10 days) testing not required  Diagnosis: Intractable nausea and vomiting [id number]  Admitting Physician: Bernadette Hoit [id number]  Attending Physician: Bernadette Hoit [id number]  Certification:: I certify this patient will need inpatient services for at least 2 midnights  Estimated Length of Stay: 3          B Medical/Surgery History Past Medical History:  Diagnosis Date   Anxiety    Bradycardia 04/17/2017   Chest pain on breathing 02/22/2020   Chronic back pain    Depression    Erosive gastritis 04/11/2017   Gastric ulcer    Genital warts    GERD (gastroesophageal reflux disease)    High cholesterol    History of gestational hypertension 05/21/2018   Dx intrapartum   History of kidney stones    Intertrigo 11/16/2019   Intractable abdominal pain 04/11/2017   Intractable nausea and vomiting 12/07/2018   Intractable vomiting 05/20/2020   Irregular intermenstrual bleeding 02/19/2019   Mental disorder    Migraine without aura and without status migrainosus, not intractable 02/19/2019   Nausea 05/10/2020   Nausea &  vomiting 12/07/2018   Nexplanon in place 02/19/2019   Obesity 04/17/2017   Panic disorder    Peptic ulcer disease 04/13/2017   Pinched nerve 09/24/2019   Postpartum hypertension 06/23/2018   requiring norvasc, resolved at pp visit   Sciatica    Tremor of both hands    Vaginal Pap smear, abnormal    Wheezing    Past Surgical History:  Procedure Laterality Date   BACK SURGERY     ruptured disc.   BACK SURGERY  11/2021   BIOPSY  04/14/2017   Procedure: BIOPSY;  Surgeon: Rogene Houston, MD;  [facility];  Service: Endoscopy;;  gastric   BIOPSY  05/22/2020   Procedure: BIOPSY;  Surgeon: Daneil Dolin, MD;  [facility];  Service: Endoscopy;;   BIOPSY  08/29/2020   Procedure: BIOPSY;  Surgeon: Harvel Quale, MD;  [facility];  Service: Gastroenterology;;   BIOPSY  09/09/2020   Procedure: BIOPSY;  Surgeon: Eloise Harman, DO;  [facility];  Service: Endoscopy;;  duodenum;antral   BIOPSY  11/21/2021   Procedure: BIOPSY;  Surgeon: Rogene Houston, MD;  [facility];  Service: Endoscopy;;   CERVICAL ABLATION N/A 12/02/2018   Procedure: LASER ABLATION OF CERVIX;  Surgeon: Florian Buff, MD;  [facility];  Service: Gynecology;  Laterality: N/A;   CHOLECYSTECTOMY N/A 04/17/2017   Procedure: LAPAROSCOPIC CHOLECYSTECTOMY;  Surgeon: Aviva Signs, MD;  [facility];  Service: General;  Laterality: N/A;   COLONOSCOPY WITH PROPOFOL N/A 08/29/2020   Procedure: COLONOSCOPY WITH PROPOFOL;  Surgeon: Harvel Quale, MD;  [facility];  Service: Gastroenterology;  Laterality: N/A;  11:15   COLPOSCOPY     ESOPHAGOGASTRODUODENOSCOPY (EGD) WITH PROPOFOL N/A 04/14/2017   Procedure: ESOPHAGOGASTRODUODENOSCOPY (EGD) WITH PROPOFOL;  Surgeon: Rogene Houston, MD;  [facility];  Service: Endoscopy;  Laterality: N/A;   ESOPHAGOGASTRODUODENOSCOPY (EGD) WITH PROPOFOL N/A 05/22/2020    Procedure: ESOPHAGOGASTRODUODENOSCOPY (EGD) WITH PROPOFOL;  Surgeon: Daneil Dolin, MD;  [facility];  Service: Endoscopy;  Laterality: N/A;   ESOPHAGOGASTRODUODENOSCOPY (EGD) WITH PROPOFOL N/A 09/09/2020   Procedure: ESOPHAGOGASTRODUODENOSCOPY (EGD) WITH PROPOFOL;  Surgeon: Eloise Harman, DO;  [facility];  Service: Endoscopy;  Laterality: N/A;   ESOPHAGOGASTRODUODENOSCOPY (EGD) WITH PROPOFOL N/A 11/21/2020   Procedure: ESOPHAGOGASTRODUODENOSCOPY (EGD) WITH PROPOFOL;  Surgeon: Harvel Quale, MD;  [facility];  Service: Gastroenterology;  Laterality: N/A;  Am   ESOPHAGOGASTRODUODENOSCOPY (EGD) WITH PROPOFOL N/A 11/21/2021   Procedure: ESOPHAGOGASTRODUODENOSCOPY (EGD) WITH PROPOFOL;  Surgeon: Rogene Houston, MD;  [facility];  Service: Endoscopy;  Laterality: N/A;  820   HERNIA REPAIR Bilateral    inguinal   LASER ABLATION CONDOLAMATA N/A 12/02/2018   Procedure: LASER ABLATION CONDYLOMA ACCUMINATA LEFT AND RIGHT VULVA, PERINEUM AND PERIANAL (15 TOTAL);  Surgeon: Florian Buff, MD;  [facility];  Service: Gynecology;  Laterality: N/A;   LUMBAR LAMINECTOMY/DECOMPRESSION MICRODISCECTOMY Right 12/03/2021   Procedure: MICRODISCECTOMY Lumbar four- five;  Surgeon: Newman Pies, MD;  Location: [facility];  Service: Neurosurgery;  Laterality: Right;   MOUTH SURGERY  09/2021   All top teeth and all wisdom teeth removed   MOUTH SURGERY  12/2021   POLYPECTOMY  08/29/2020   Procedure: POLYPECTOMY;  Surgeon: Montez Morita, Quillian Quince, MD;  [facility];  Service: Gastroenterology;;     A IV Location/Drains/Wounds Patient Lines/Drains/Airways Status     Active Line/Drains/Airways     Name Placement date Placement time Site Days   Peripheral IV 12/16/22 20 G Anterior;Proximal;Right Forearm 12/16/22  1402  Forearm  less than 1            Intake/Output Last 24 hours  Intake/Output Summary (Last 24 hours) at  12/16/2022 2304 Last data filed at 12/16/2022 2115 Gross per 24 hour  Intake 50 ml  Output --  Net 50 ml    Labs/Imaging Results for orders placed or performed during the hospital encounter of 12/16/22 (from the past 48 hour(s))  CBC with Differential     Status: Abnormal   Collection Time: 12/16/22  1:55 PM  Result Value Ref Range   WBC 18.2 (H) 4.0 - 10.5 K/uL   RBC 4.59 3.87 - 5.11 MIL/uL   Hemoglobin 15.1 (H) 12.0 - 15.0 g/dL   HCT 43.4 36.0 - 46.0 %   MCV 94.6 80.0 - 100.0 fL   MCH 32.9 26.0 - 34.0 pg   MCHC 34.8 30.0 - 36.0 g/dL   RDW 12.0 11.5 - 15.5 %   Platelets 248 150 - 400 K/uL   nRBC 0.0 0.0 - 0.2 %   Neutrophils Relative % 87 %   Neutro Abs 15.8 (H) 1.7 - 7.7 K/uL   Lymphocytes Relative 11 %   Lymphs Abs 2.0 0.7 - 4.0 K/uL   Monocytes Relative 2 %   Monocytes Absolute 0.3 0.1 - 1.0 K/uL   Eosinophils Relative 0 %  Eosinophils Absolute 0.0 0.0 - 0.5 K/uL   Basophils Relative 0 %   Basophils Absolute 0.0 0.0 - 0.1 K/uL   Immature Granulocytes 0 %   Abs Immature Granulocytes 0.08 (H) 0.00 - 0.07 K/uL    Comment: Performed at [facility], [address], [place], [place] [address]  Comprehensive metabolic panel     Status: Abnormal   Collection Time: 12/16/22  1:55 PM  Result Value Ref Range   Sodium 136 135 - 145 mmol/L   Potassium 3.9 3.5 - 5.1 mmol/L   Chloride 106 98 - 111 mmol/L   CO2 22 22 - 32 mmol/L   Glucose, Bld 140 (H) 70 - 99 mg/dL    Comment: Glucose reference range applies only to samples taken after fasting for at least 8 hours.   BUN 8 6 - 20 mg/dL   Creatinine, Ser 0.81 0.44 - 1.00 mg/dL   Calcium 9.5 8.9 - 10.3 mg/dL   Total Protein 7.6 6.5 - 8.1 g/dL   Albumin 4.3 3.5 - 5.0 g/dL   AST 16 15 - 41 U/L   ALT 12 0 - 44 U/L   Alkaline Phosphatase 67 38 - 126 U/L   Total Bilirubin 0.5 0.3 - 1.2 mg/dL   GFR, Estimated >60 >60 mL/min    Comment: (NOTE) Calculated using the CKD-EPI Creatinine Equation (2021)    Anion gap 8 5 - 15     Comment: Performed at [facility], [address] [place], [place] [address]  Lipase, blood     Status: None   Collection Time: 12/16/22  1:55 PM  Result Value Ref Range   Lipase 24 11 - 51 U/L    Comment: Performed at [facility], [address], [place], [place] [address]  Protime-INR     Status: None   Collection Time: 12/16/22  1:55 PM  Result Value Ref Range   Prothrombin Time 13.7 11.4 - 15.2 seconds   INR 1.1 0.8 - 1.2    Comment: (NOTE) INR goal varies based on device and disease states. Performed at [facility], [address], [place], [place] [address]   Urinalysis, Routine w reflex microscopic -Urine, Clean Catch     Status: Abnormal   Collection Time: 12/16/22  4:04 PM  Result Value Ref Range   Color, Urine YELLOW YELLOW   APPearance HAZY (A) CLEAR   Specific Gravity, Urine 1.021 1.005 - 1.030   pH 6.0 5.0 - 8.0   Glucose, UA NEGATIVE NEGATIVE mg/dL   Hgb urine dipstick NEGATIVE NEGATIVE   Bilirubin Urine NEGATIVE NEGATIVE   Ketones, ur 5 (A) NEGATIVE mg/dL   Protein, ur NEGATIVE NEGATIVE mg/dL   Nitrite NEGATIVE NEGATIVE   Leukocytes,Ua SMALL (A) NEGATIVE   RBC / HPF 6-10 0 - 5 RBC/hpf   WBC, UA 11-20 0 - 5 WBC/hpf   Bacteria, UA NONE SEEN NONE SEEN   Squamous Epithelial / HPF 0-5 0 - 5 /HPF   Mucus PRESENT    Hyaline Casts, UA PRESENT     Comment: Performed at [facility], [address] [place], [place] [address]  Pregnancy, urine     Status: None   Collection Time: 12/16/22  4:04 PM  Result Value Ref Range   Preg Test, Ur NEGATIVE NEGATIVE    Comment:        THE SENSITIVITY OF THIS METHODOLOGY IS >20 mIU/mL. Performed at [facility], [address], [place], [place] [address]   Rapid urine drug screen (hospital performed)  Status: Abnormal   Collection Time: 12/16/22  4:04 PM  Result Value Ref Range   Opiates POSITIVE (A) NONE DETECTED   Cocaine NONE DETECTED NONE DETECTED   Benzodiazepines POSITIVE (A) NONE DETECTED   Amphetamines NONE  DETECTED NONE DETECTED   Tetrahydrocannabinol POSITIVE (A) NONE DETECTED   Barbiturates NONE DETECTED NONE DETECTED    Comment: (NOTE) DRUG SCREEN FOR MEDICAL PURPOSES ONLY.  IF CONFIRMATION IS NEEDED FOR ANY PURPOSE, NOTIFY LAB WITHIN 5 DAYS.  LOWEST DETECTABLE LIMITS FOR URINE DRUG SCREEN Drug Class                     Cutoff (ng/mL) Amphetamine and metabolites    1000 Barbiturate and metabolites    200 Benzodiazepine                 200 Opiates and metabolites        300 Cocaine and metabolites        300 THC                            50 Performed at [facility], [address] [place], [place] [address]    CT ABDOMEN PELVIS W CONTRAST  Result Date: 12/16/2022 CLINICAL DATA:  Lower abdominal pain, known Bartholin's cyst. EXAM: CT ABDOMEN AND PELVIS WITH CONTRAST TECHNIQUE: Multidetector CT imaging of the abdomen and pelvis was performed using the standard protocol following bolus administration of intravenous contrast. RADIATION DOSE REDUCTION: This exam was performed according to the departmental dose-optimization program which includes automated exposure control, adjustment of the mA and/or kV according to patient size and/or use of iterative reconstruction technique. CONTRAST:  146mL OMNIPAQUE IOHEXOL 300 MG/ML  SOLN COMPARISON:  CT abdomen pelvis 06/20/2022 FINDINGS: Lower chest: No acute abnormality. Hepatobiliary: No focal liver abnormality is seen. Status post cholecystectomy. No new biliary dilatation. Pancreas: Unremarkable. No pancreatic ductal dilatation or surrounding inflammatory changes. Spleen: Normal in size without focal abnormality. Adrenals/Urinary Tract: Adrenal glands are unremarkable. Kidneys are normal, without renal calculi, focal lesion, or hydronephrosis. Bladder is unremarkable. Stomach/Bowel: Stomach is within normal limits. Appendix appears normal. No evidence of bowel wall thickening, distention, or inflammatory changes. Vascular/Lymphatic: No significant  vascular findings are present. No enlarged abdominal or pelvic lymph nodes. Reproductive: Uterus and bilateral adnexa are unremarkable. There are two adjacent left Bartholin's gland cysts, the more superior and larger cyst measures 2.8 x 2.2 cm (series 2, image 85). The smaller and more inferior cyst measures 1.6 x 1.5 cm (series 2, image 89). A catheter appears to be draining the more inferior cyst. Other: No abdominal wall hernia or abnormality. No abdominopelvic ascites. Musculoskeletal: No acute or significant osseous findings. IMPRESSION: 1. No acute intra-abdominal pathology. 2. Two adjacent left Bartholin's gland cysts, the more superior and larger cyst measures 2.8 x 2.2 cm. The smaller and more inferior cyst measures 1.6 x 1.5 cm. A catheter appears to be draining the more inferior cyst. Electronically Signed   By: Audie Pinto M.D.   On: 12/16/2022 17:41    Pending Labs Unresulted Labs (From admission, onward)    None       Vitals/Pain Today's Vitals   12/16/22 1953 12/16/22 1958 12/16/22 2102 12/16/22 2300  BP:  (!) 121/58  125/60  Pulse:  64  60  Resp:  16  16  Temp:  97.6 F (36.4 C)  98 F (36.7 C)  TempSrc:  Oral  Oral  SpO2:  99%  99%  Weight:      Height:      PainSc: 10-Worst pain ever 10-Worst pain ever 10-Worst pain ever     Isolation Precautions No active isolations  Medications Medications  sodium chloride 0.9 % bolus 1,000 mL (0 mLs Intravenous Stopped 12/16/22 1517)  ondansetron (ZOFRAN) injection 4 mg (4 mg Intravenous Given 12/16/22 1417)  morphine (PF) 4 MG/ML injection 4 mg (4 mg Intravenous Given 12/16/22 1417)  famotidine (PEPCID) IVPB 20 mg premix (0 mg Intravenous Stopped 12/16/22 1513)  droperidol (INAPSINE) 2.5 MG/ML injection 1.25 mg (1.25 mg Intravenous Given 12/16/22 1633)  iohexol (OMNIPAQUE) 300 MG/ML solution 100 mL (100 mLs Intravenous Contrast Given 12/16/22 1731)  metoCLOPramide (REGLAN) injection 5 mg (5 mg Intravenous Given 12/16/22  1757)  HYDROmorphone (DILAUDID) injection 1 mg (1 mg Intravenous Given 12/16/22 1757)  promethazine (PHENERGAN) 12.5 mg in sodium chloride 0.9 % 50 mL IVPB (0 mg Intravenous Stopped 12/16/22 2115)  pantoprazole (PROTONIX) injection 40 mg (40 mg Intravenous Given 12/16/22 2229)  droperidol (INAPSINE) 2.5 MG/ML injection 2.5 mg (2.5 mg Intravenous Given 12/16/22 2228)    Mobility walks     Focused Assessments    R Recommendations: See Admitting Provider Note  Report given to:   Additional Notes:

## 2022-12-17 DIAGNOSIS — K259 Gastric ulcer, unspecified as acute or chronic, without hemorrhage or perforation: Secondary | ICD-10-CM | POA: Diagnosis not present

## 2022-12-17 DIAGNOSIS — F419 Anxiety disorder, unspecified: Secondary | ICD-10-CM

## 2022-12-17 DIAGNOSIS — R112 Nausea with vomiting, unspecified: Secondary | ICD-10-CM | POA: Diagnosis not present

## 2022-12-17 DIAGNOSIS — D72829 Elevated white blood cell count, unspecified: Secondary | ICD-10-CM | POA: Diagnosis not present

## 2022-12-17 DIAGNOSIS — F1910 Other psychoactive substance abuse, uncomplicated: Secondary | ICD-10-CM

## 2022-12-17 DIAGNOSIS — N750 Cyst of Bartholin's gland: Secondary | ICD-10-CM | POA: Diagnosis not present

## 2022-12-17 DIAGNOSIS — Z720 Tobacco use: Secondary | ICD-10-CM

## 2022-12-17 DIAGNOSIS — F32A Depression, unspecified: Secondary | ICD-10-CM

## 2022-12-17 DIAGNOSIS — K219 Gastro-esophageal reflux disease without esophagitis: Secondary | ICD-10-CM

## 2022-12-17 DIAGNOSIS — K2960 Other gastritis without bleeding: Secondary | ICD-10-CM

## 2022-12-17 DIAGNOSIS — G894 Chronic pain syndrome: Secondary | ICD-10-CM

## 2022-12-17 DIAGNOSIS — F418 Other specified anxiety disorders: Secondary | ICD-10-CM

## 2022-12-17 LAB — COMPREHENSIVE METABOLIC PANEL
ALT: 12 U/L (ref 0–44)
AST: 16 U/L (ref 15–41)
Albumin: 4.1 g/dL (ref 3.5–5.0)
Alkaline Phosphatase: 67 U/L (ref 38–126)
Anion gap: 12 (ref 5–15)
BUN: 9 mg/dL (ref 6–20)
CO2: 23 mmol/L (ref 22–32)
Calcium: 9.5 mg/dL (ref 8.9–10.3)
Chloride: 101 mmol/L (ref 98–111)
Creatinine, Ser: 0.8 mg/dL (ref 0.44–1.00)
GFR, Estimated: >60 mL/min (ref >60)
Glucose, Bld: 105 mg/dL — ABNORMAL HIGH (ref 70–99)
Potassium: 3.3 mmol/L — ABNORMAL LOW (ref 3.5–5.1)
Sodium: 136 mmol/L (ref 135–145)
Total Bilirubin: 0.6 mg/dL (ref 0.3–1.2)
Total Protein: 7.4 g/dL (ref 6.5–8.1)

## 2022-12-17 LAB — GLUCOSE, CAPILLARY: Glucose-Capillary: 113 mg/dL — ABNORMAL HIGH (ref 70–99)

## 2022-12-17 LAB — CBC
HCT: 42.8 % (ref 36.0–46.0)
Hemoglobin: 14.4 g/dL (ref 12.0–15.0)
MCH: 32.1 pg (ref 26.0–34.0)
MCHC: 33.6 g/dL (ref 30.0–36.0)
MCV: 95.3 fL (ref 80.0–100.0)
Platelets: 213 10*3/uL (ref 150–400)
RBC: 4.49 MIL/uL (ref 3.87–5.11)
RDW: 12 % (ref 11.5–15.5)
WBC: 16.3 10*3/uL — ABNORMAL HIGH (ref 4.0–10.5)
nRBC: 0 % (ref 0.0–0.2)

## 2022-12-17 LAB — HIV ANTIBODY (ROUTINE TESTING W REFLEX): HIV Screen 4th Generation wRfx: NONREACTIVE

## 2022-12-17 LAB — PHOSPHORUS: Phosphorus: 3.3 mg/dL (ref 2.5–4.6)

## 2022-12-17 LAB — MAGNESIUM: Magnesium: 1.9 mg/dL (ref 1.7–2.4)

## 2022-12-17 MED ORDER — ONDANSETRON HCL 4 MG/2ML IJ SOLN
4.0000 mg | Freq: Four times a day (QID) | INTRAMUSCULAR | Status: DC | PRN
  Administered 2022-12-17: 4 mg via INTRAVENOUS
  Filled 2022-12-17: qty 2

## 2022-12-17 MED ORDER — ONDANSETRON HCL 4 MG/2ML IJ SOLN
4.0000 mg | Freq: Four times a day (QID) | INTRAMUSCULAR | Status: DC
  Administered 2022-12-17 – 2022-12-19 (×9): 4 mg via INTRAVENOUS
  Filled 2022-12-17 (×9): qty 2

## 2022-12-17 MED ORDER — SUCRALFATE 1 GM/10ML PO SUSP
1.0000 g | Freq: Three times a day (TID) | ORAL | Status: DC
  Administered 2022-12-17 – 2022-12-19 (×10): 1 g via ORAL
  Filled 2022-12-17 (×9): qty 10

## 2022-12-17 MED ORDER — HYDROMORPHONE HCL 1 MG/ML IJ SOLN
0.5000 mg | INTRAMUSCULAR | Status: DC | PRN
  Administered 2022-12-17 (×3): 0.5 mg via INTRAVENOUS
  Filled 2022-12-17 (×3): qty 0.5

## 2022-12-17 MED ORDER — CLONAZEPAM 0.5 MG PO TABS
1.0000 mg | ORAL_TABLET | Freq: Four times a day (QID) | ORAL | Status: DC | PRN
  Administered 2022-12-17 – 2022-12-19 (×7): 1 mg via ORAL
  Filled 2022-12-17 (×8): qty 2

## 2022-12-17 MED ORDER — HYDROMORPHONE HCL 1 MG/ML IJ SOLN
1.0000 mg | INTRAMUSCULAR | Status: DC | PRN
  Administered 2022-12-17 – 2022-12-19 (×15): 1 mg via INTRAVENOUS
  Filled 2022-12-17 (×15): qty 1

## 2022-12-17 MED ORDER — ACETAMINOPHEN 325 MG PO TABS: 650.0000 mg | ORAL_TABLET | Freq: Four times a day (QID) | ORAL | Status: DC | PRN

## 2022-12-17 MED ORDER — NICOTINE 14 MG/24HR TD PT24
14.0000 mg | MEDICATED_PATCH | Freq: Every day | TRANSDERMAL | Status: DC
  Administered 2022-12-17 – 2022-12-19 (×3): 14 mg via TRANSDERMAL
  Filled 2022-12-17 (×3): qty 1

## 2022-12-17 MED ORDER — ENOXAPARIN SODIUM 40 MG/0.4ML IJ SOSY
40.0000 mg | PREFILLED_SYRINGE | INTRAMUSCULAR | Status: DC
  Administered 2022-12-17 – 2022-12-19 (×3): 40 mg via SUBCUTANEOUS
  Filled 2022-12-17 (×3): qty 0.4

## 2022-12-17 MED ORDER — ACETAMINOPHEN 650 MG RE SUPP
650.0000 mg | Freq: Four times a day (QID) | RECTAL | Status: DC | PRN
  Administered 2022-12-17: 650 mg via RECTAL
  Filled 2022-12-17: qty 1

## 2022-12-17 MED ORDER — SERTRALINE HCL 50 MG PO TABS
200.0000 mg | ORAL_TABLET | Freq: Every day | ORAL | Status: DC
  Administered 2022-12-18 – 2022-12-19 (×2): 200 mg via ORAL
  Filled 2022-12-17 (×3): qty 4

## 2022-12-17 MED ORDER — ONDANSETRON HCL 4 MG PO TABS: 4.0000 mg | ORAL_TABLET | Freq: Four times a day (QID) | ORAL | Status: DC | PRN

## 2022-12-17 MED ORDER — IPRATROPIUM-ALBUTEROL 0.5-2.5 (3) MG/3ML IN SOLN
3.0000 mL | Freq: Three times a day (TID) | RESPIRATORY_TRACT | Status: DC
  Administered 2022-12-17 (×3): 3 mL via RESPIRATORY_TRACT
  Filled 2022-12-17 (×3): qty 3

## 2022-12-17 MED ORDER — IPRATROPIUM-ALBUTEROL 0.5-2.5 (3) MG/3ML IN SOLN
3.0000 mL | Freq: Three times a day (TID) | RESPIRATORY_TRACT | Status: DC
  Administered 2022-12-18 – 2022-12-19 (×5): 3 mL via RESPIRATORY_TRACT
  Filled 2022-12-17 (×5): qty 3

## 2022-12-17 MED ORDER — BUDESONIDE 0.5 MG/2ML IN SUSP
0.5000 mg | Freq: Two times a day (BID) | RESPIRATORY_TRACT | Status: DC
  Administered 2022-12-17 – 2022-12-19 (×5): 0.5 mg via RESPIRATORY_TRACT
  Filled 2022-12-17 (×5): qty 2

## 2022-12-17 MED ORDER — POTASSIUM CHLORIDE IN NACL 20-0.9 MEQ/L-% IV SOLN: INTRAVENOUS | Status: DC

## 2022-12-17 MED ORDER — PANTOPRAZOLE SODIUM 40 MG IV SOLR
40.0000 mg | Freq: Two times a day (BID) | INTRAVENOUS | Status: DC
  Administered 2022-12-17 – 2022-12-19 (×5): 40 mg via INTRAVENOUS
  Filled 2022-12-17 (×5): qty 10

## 2022-12-17 MED ORDER — ALBUTEROL SULFATE (2.5 MG/3ML) 0.083% IN NEBU: 2.5000 mg | INHALATION_SOLUTION | RESPIRATORY_TRACT | Status: DC | PRN

## 2022-12-17 NOTE — H&P (Signed)
History and Physical    Patient: Debbie Santana [id number] DOB: 1991/01/25 DOA: 12/16/2022 DOS: the patient was seen and examined on 12/17/2022 PCP: Leslie Andrea, MD  Patient coming from: Home  Chief Complaint:  Chief Complaint  Patient presents with   Abdominal Pain   HPI: Debbie Santana is a 32 y.o. female with medical history significant of GERD, gastric ulcer, erosive gastritis, chronic back pain, anxiety, depression, tobacco abuse, history of marijuana use presents to the emergency department due to abdominal pain, nausea and vomiting which started yesterday in the morning, pain was sharp in nature and worse in epigastric region without radiation, she also complained of low back pain.  She denies fever, chills, chest pain, shortness of breath, alcohol or recreational drug use.  She endorsed having a catheter in Bartholin's gland cyst.  ED Course:  In the emergency department, she was hemodynamically stable with a BP of 121/58 and other vital signs are within normal range.  Workup in the ED showed WBC of 18.2 with a left shift, hemoglobin 15.1, hematocrit 43.4, MCV 94.6, platelets 248.  BMP was normal except for blood glucose of 140, lipase 24, pregnancy test was negative, urinalysis was unimpressive for UTI, urine drug screen was positive for opiates, benzodiazepine and THC. CT abdomen and pelvis with contrast showed no acute intra-abdominal pathology.  Two adjacent left Bartholin's gland cysts, the more superior and larger cyst measures 2.8 x 2.2 cm. The smaller and more inferior cyst measures 1.6 x 1.5 cm. A catheter appears to be draining the more inferior cyst. Patient was treated with droperidol, Pepcid, Dilaudid, morphine, Phenergan, Protonix and IV hydration was provided.  Hospitalist was asked to admit patient for further evaluation and management.   Review of Systems: Review of systems as noted in the HPI. All other systems reviewed and are negative.   Past Medical  History:  Diagnosis Date   Anxiety    Bradycardia 04/17/2017   Chest pain on breathing 02/22/2020   Chronic back pain    Depression    Erosive gastritis 04/11/2017   Gastric ulcer    Genital warts    GERD (gastroesophageal reflux disease)    High cholesterol    History of gestational hypertension 05/21/2018   Dx intrapartum   History of kidney stones    Intertrigo 11/16/2019   Intractable abdominal pain 04/11/2017   Intractable nausea and vomiting 12/07/2018   Intractable vomiting 05/20/2020   Irregular intermenstrual bleeding 02/19/2019   Mental disorder    Migraine without aura and without status migrainosus, not intractable 02/19/2019   Nausea 05/10/2020   Nausea & vomiting 12/07/2018   Nexplanon in place 02/19/2019   Obesity 04/17/2017   Panic disorder    Peptic ulcer disease 04/13/2017   Pinched nerve 09/24/2019   Postpartum hypertension 06/23/2018   requiring norvasc, resolved at pp visit   Sciatica    Tremor of both hands    Vaginal Pap smear, abnormal    Wheezing    Past Surgical History:  Procedure Laterality Date   BACK SURGERY     ruptured disc.   BACK SURGERY  11/2021   BIOPSY  04/14/2017   Procedure: BIOPSY;  Surgeon: Rogene Houston, MD;  [facility];  Service: Endoscopy;;  gastric   BIOPSY  05/22/2020   Procedure: BIOPSY;  Surgeon: Daneil Dolin, MD;  [facility];  Service: Endoscopy;;   BIOPSY  08/29/2020   Procedure: BIOPSY;  Surgeon: Harvel Quale, MD;  [facility];  Service: Gastroenterology;;   BIOPSY  09/09/2020   Procedure: BIOPSY;  Surgeon: Eloise Harman, DO;  [facility];  Service: Endoscopy;;  duodenum;antral   BIOPSY  11/21/2021   Procedure: BIOPSY;  Surgeon: Rogene Houston, MD;  [facility];  Service: Endoscopy;;   CERVICAL ABLATION N/A 12/02/2018   Procedure: LASER ABLATION OF CERVIX;  Surgeon: Florian Buff, MD;  [facility]: [facility];  Service: Gynecology;   Laterality: N/A;   CHOLECYSTECTOMY N/A 04/17/2017   Procedure: LAPAROSCOPIC CHOLECYSTECTOMY;  Surgeon: Aviva Signs, MD;  Location: [facility];  Service: General;  Laterality: N/A;   COLONOSCOPY WITH PROPOFOL N/A 08/29/2020   Procedure: COLONOSCOPY WITH PROPOFOL;  Surgeon: Harvel Quale, MD;  [facility];  Service: Gastroenterology;  Laterality: N/A;  11:15   COLPOSCOPY     ESOPHAGOGASTRODUODENOSCOPY (EGD) WITH PROPOFOL N/A 04/14/2017   Procedure: ESOPHAGOGASTRODUODENOSCOPY (EGD) WITH PROPOFOL;  Surgeon: Rogene Houston, MD;  [facility];  Service: Endoscopy;  Laterality: N/A;   ESOPHAGOGASTRODUODENOSCOPY (EGD) WITH PROPOFOL N/A 05/22/2020   Procedure: ESOPHAGOGASTRODUODENOSCOPY (EGD) WITH PROPOFOL;  Surgeon: Daneil Dolin, MD;  [facility];  Service: Endoscopy;  Laterality: N/A;   ESOPHAGOGASTRODUODENOSCOPY (EGD) WITH PROPOFOL N/A 09/09/2020   Procedure: ESOPHAGOGASTRODUODENOSCOPY (EGD) WITH PROPOFOL;  Surgeon: Eloise Harman, DO;  [facility];  Service: Endoscopy;  Laterality: N/A;   ESOPHAGOGASTRODUODENOSCOPY (EGD) WITH PROPOFOL N/A 11/21/2020   Procedure: ESOPHAGOGASTRODUODENOSCOPY (EGD) WITH PROPOFOL;  Surgeon: Harvel Quale, MD;  [facility];  Service: Gastroenterology;  Laterality: N/A;  Am   ESOPHAGOGASTRODUODENOSCOPY (EGD) WITH PROPOFOL N/A 11/21/2021   Procedure: ESOPHAGOGASTRODUODENOSCOPY (EGD) WITH PROPOFOL;  Surgeon: Rogene Houston, MD;  [facility];  Service: Endoscopy;  Laterality: N/A;  820   HERNIA REPAIR Bilateral    inguinal   LASER ABLATION CONDOLAMATA N/A 12/02/2018   Procedure: LASER ABLATION CONDYLOMA ACCUMINATA LEFT AND RIGHT VULVA, PERINEUM AND PERIANAL (15 TOTAL);  Surgeon: Florian Buff, MD;  Location[facility];  Service: Gynecology;  Laterality: N/A;   LUMBAR LAMINECTOMY/DECOMPRESSION MICRODISCECTOMY Right 12/03/2021   Procedure: MICRODISCECTOMY Lumbar four- five;   Surgeon: Newman Pies, MD;  Location: [place];  Service: Neurosurgery;  Laterality: Right;   MOUTH SURGERY  09/2021   All top teeth and all wisdom teeth removed   MOUTH SURGERY  12/2021   POLYPECTOMY  08/29/2020   Procedure: POLYPECTOMY;  Surgeon: Harvel Quale, MD;  [facility];  Service: Gastroenterology;;    Social History:  reports that she has been smoking cigarettes. She has a 10.00 pack-year smoking history. She has been exposed to tobacco smoke. She has never used smokeless tobacco. She reports that she does not drink alcohol and does not use drugs.   Allergies  Allergen Reactions   Buspirone    Fluoxetine    Nsaids Other (See Comments)    History of ulcers    Family History  Problem Relation Age of Onset   Anxiety disorder Mother    Hyperlipidemia Mother    Crohn's disease Sister    Diabetes Maternal Grandfather    Diabetes Cousin    Learning disabilities Cousin      Prior to Admission medications   Medication Sig Start Date End Date Taking? Authorizing Provider  AIMOVIG 70 MG/ML SOAJ Inject into the skin. 10/16/22   [provider]  albuterol (VENTOLIN HFA) 108 (90 Base) MCG/ACT inhaler Inhale 2 puffs into the lungs every  6 (six) hours as needed for wheezing or shortness of breath. 09/11/20   Orson Eva, MD  baclofen (LIORESAL) 20 MG tablet Take 10-20 mg by mouth every 6 (six) hours as needed. 08/14/22   [provider]  butalbital-acetaminophen-caffeine (FIORICET) 50-325-40 MG tablet TAKE 1 TABLET BY MOUTH EVERY 6 HOURS AS NEEDED FOR MIGRAINE HEADACHE Patient taking differently: Take 2 tablets by mouth 2 (two) times daily as needed for migraine. 12/04/20   Estill Dooms, NP  clonazePAM (KLONOPIN) 1 MG tablet Take 1 mg by mouth 4 (four) times daily as needed. 10/23/22   [provider]  diphenhydrAMINE (BENADRYL) 25 MG tablet Take 25 mg by mouth every 6 (six) hours as needed for allergies.    [provider]  etonogestrel (NEXPLANON) 68 MG IMPL implant 68 mg by Subdermal route once.    [provider]  hyoscyamine (LEVSIN SL) 0.125 MG SL tablet Place 1 tablet (0.125 mg total) under the tongue every 6 (six) hours as needed. 05/07/22   Carlan, Deatra Robinson, NP  NURTEC 75 MG TBDP Take by mouth. 10/23/22   [provider]  ondansetron (ZOFRAN) 8 MG tablet Take 8 mg by mouth every 8 (eight) hours as needed for nausea or vomiting. 08/21/21   [provider]  oxyCODONE-acetaminophen (PERCOCET) 10-325 MG tablet SMARTSIG:1 Tablet(s) By Mouth 1-4 Times Daily 06/05/22   [provider]  propranolol (INDERAL) 10 MG tablet Take 1 tablet (10 mg total) by mouth 2 (two) times daily. 11/12/22   Genia Harold, MD  sertraline (ZOLOFT) 50 MG tablet Take 200 mg by mouth daily.    [provider]  SPIRIVA RESPIMAT 2.5 MCG/ACT AERS SMARTSIG:2 Puff(s) Via Inhaler Daily 09/24/22   [provider]  sucralfate (CARAFATE) 1 g tablet Take 1 tablet (1 g total) by mouth 3 (three) times daily before meals. 11/06/21   Carlan, Chelsea L, NP  sulfamethoxazole-trimethoprim (BACTRIM DS) 800-160 MG tablet Take 1 tablet by mouth 2 (two) times daily. X 7 days 12/09/22   Roma Schanz, CNM  tiZANidine (ZANAFLEX) 4 MG tablet Take 4 mg by mouth at bedtime. 10/23/22   [provider]  VRAYLAR 1.5 MG capsule Take 1.5 mg by mouth daily. 09/27/22   [provider]    Physical Exam: BP 130/66   Pulse 61   Temp 97.6 F (36.4 C) (Oral)   Resp 18   Ht 5\' 8"  (1.727 m)   Wt 83.3 kg   LMP 12/08/2022   SpO2 100%   BMI 27.92 kg/m   General: 32 y.o. year-old female well developed well nourished in no acute distress.  Alert and oriented x3. HEENT: NCAT, EOMI Neck: Supple, trachea medial Cardiovascular: Regular rate and rhythm with no rubs or gallops.  No thyromegaly or JVD noted.  No lower extremity edema. 2/4 pulses in all 4 extremities. Respiratory: Clear to auscultation with  no wheezes or rales. Good inspiratory effort. Abdomen: Soft, tender to palpation of abdomen in the epigastric area without guarding.  Normal bowel sounds x4 quadrants. Muskuloskeletal: No cyanosis, clubbing or edema noted bilaterally Neuro: CN II-XII intact, strength 5/5 x 4, sensation, reflexes intact Skin: No ulcerative lesions noted or rashes Psychiatry: Mood is appropriate for condition and setting          Labs on Admission:  Basic Metabolic Panel: Recent Labs  Lab 12/16/22 1355  NA 136  K 3.9  CL 106  CO2 22  GLUCOSE 140*  BUN 8  CREATININE 0.81  CALCIUM 9.5   Liver Function Tests: Recent Labs  Lab 12/16/22 1355  AST 16  ALT 12  ALKPHOS 67  BILITOT 0.5  PROT 7.6  ALBUMIN 4.3   Recent Labs  Lab 12/16/22 1355  LIPASE 24   No results for input(s): "AMMONIA" in the last 168 hours. CBC: Recent Labs  Lab 12/16/22 1355  WBC 18.2*  NEUTROABS 15.8*  HGB 15.1*  HCT 43.4  MCV 94.6  PLT 248   Cardiac Enzymes: No results for input(s): "CKTOTAL", "CKMB", "CKMBINDEX", "TROPONINI" in the last 168 hours.  BNP (last 3 results) No results for input(s): "BNP" in the last 8760 hours.  ProBNP (last 3 results) No results for input(s): "PROBNP" in the last 8760 hours.  CBG: No results for input(s): "GLUCAP" in the last 168 hours.  Radiological Exams on Admission: CT ABDOMEN PELVIS W CONTRAST  Result Date: 12/16/2022 CLINICAL DATA:  Lower abdominal pain, known Bartholin's cyst. EXAM: CT ABDOMEN AND PELVIS WITH CONTRAST TECHNIQUE: Multidetector CT imaging of the abdomen and pelvis was performed using the standard protocol following bolus administration of intravenous contrast. RADIATION DOSE REDUCTION: This exam was performed according to the departmental dose-optimization program which includes automated exposure control, adjustment of the mA and/or kV according to patient size and/or use of iterative reconstruction technique. CONTRAST:  136mL OMNIPAQUE IOHEXOL 300  MG/ML  SOLN COMPARISON:  CT abdomen pelvis 06/20/2022 FINDINGS: Lower chest: No acute abnormality. Hepatobiliary: No focal liver abnormality is seen. Status post cholecystectomy. No new biliary dilatation. Pancreas: Unremarkable. No pancreatic ductal dilatation or surrounding inflammatory changes. Spleen: Normal in size without focal abnormality. Adrenals/Urinary Tract: Adrenal glands are unremarkable. Kidneys are normal, without renal calculi, focal lesion, or hydronephrosis. Bladder is unremarkable. Stomach/Bowel: Stomach is within normal limits. Appendix appears normal. No evidence of bowel wall thickening, distention, or inflammatory changes. Vascular/Lymphatic: No significant vascular findings are present. No enlarged abdominal or pelvic lymph nodes. Reproductive: Uterus and bilateral adnexa are unremarkable. There are two adjacent left Bartholin's gland cysts, the more superior and larger cyst measures 2.8 x 2.2 cm (series 2, image 85). The smaller and more inferior cyst measures 1.6 x 1.5 cm (series 2, image 89). A catheter appears to be draining the more inferior cyst. Other: No abdominal wall hernia or abnormality. No abdominopelvic ascites. Musculoskeletal: No acute or significant osseous findings. IMPRESSION: 1. No acute intra-abdominal pathology. 2. Two adjacent left Bartholin's gland cysts, the more superior and larger cyst measures 2.8 x 2.2 cm. The smaller and more inferior cyst measures 1.6 x 1.5 cm. A catheter appears to be draining the more inferior cyst. Electronically Signed   By: Audie Pinto M.D.   On: 12/16/2022 17:41    EKG: I independently viewed the EKG done and my findings are as followed: EKG was not done in the ED  Assessment/Plan Present on Admission:  Intractable nausea and vomiting  Gastroesophageal reflux disease  Low back pain  Leukocytosis  Tobacco abuse  Anxiety and depression  Principal Problem:   Intractable nausea and vomiting Active Problems:    Leukocytosis   Tobacco abuse   Low back pain   Anxiety and depression   Gastroesophageal reflux disease   Substance abuse ([place])   Bartholin's gland cyst  Intractable nausea, vomiting and abdominal pain This may be due to multifactorial, including history of GERD, gastric ulcer, erosive gastritis, cannabis hyperemesis syndrome EGD done on 09/09/2020 showed moderately severe reflux esophagitis; diffuse moderate gastritis CT abdomen and pelvis done showed no acute intra-abdominal pathology Continue  IV Protonix 40 mg twice daily Continue sucralfate Continue Dilaudid as needed Continue Zofran as needed Lipase stable at 24  GERD, gastric ulcer, erosive gastritis Continue treatment as described above  Low back pain/chronic pain syndrome Continue on Dilaudid as needed Patient follows with pain clinic  Leukocytosis possibly reactive WBC 18.2, this is possibly due to stress demargination No suspicion for an acute infectious process at this time Urinalysis was normal Continue to monitor WBC with morning labs  Bartholin gland cyst CT abdomen and pelvis showed Two adjacent left Bartholin's gland cysts, the more superior and larger cyst measures 2.8 x 2.2 cm. The smaller and more inferior cyst measures 1.6 x 1.5 cm. A catheter appears to be draining the more inferior cyst. Continue to monitor  Depression with anxiety Continue Klonopin, Zoloft  Substance abuse/tobacco abuse Urine drug screen was positive for opioid (given in the ED).  However, it was also positive for benzodiazepine and THC.  Patient will be counseled on tobacco abuse and cannabinoid use cessation when more stable.   DVT prophylaxis: Lovenox  Advance Care Planning:   Code Status: Full Code   Consults: None  Family Communication: None at bedside  Severity of Illness: The appropriate patient status for this patient is INPATIENT. Inpatient status is judged to be reasonable and necessary in order to provide the  required intensity of service to ensure the patient's safety. The patient's presenting symptoms, physical exam findings, and initial radiographic and laboratory data in the context of their chronic comorbidities is felt to place them at high risk for further clinical deterioration. Furthermore, it is not anticipated that the patient will be medically stable for discharge from the hospital within 2 midnights of admission.   * I certify that at the point of admission it is my clinical judgment that the patient will require inpatient hospital care spanning beyond 2 midnights from the point of admission due to high intensity of service, high risk for further deterioration and high frequency of surveillance required.*  Author: Bernadette Hoit, DO 12/17/2022 3:10 AM  For on call review [email or web address].

## 2022-12-17 NOTE — Progress Notes (Addendum)
PROGRESS NOTE  Debbie Santana [id number] DOB: Mar 23, 1991 DOA: 12/16/2022 PCP: Leslie Andrea, MD  Brief History:  32 year old female with a history of anxiety/depression, erosive esophagitis and gastritis, THC use, tobacco use, chronic back pain with lumbar radiculopathy presenting with nausea and vomiting that began in the morning of 12/16/2022.  The patient states that she continues to smoke cigarettes as well as cannabinoids.  She states that she is actually vaping the cannabinoids.  She states that she has been recently placed on a course of Bactrim for a Bartholin cyst infection.  She only had 1 more dose left.  She has had some subjective fevers and chills.  She denies any chest pain, shortness breath, hemoptysis, diarrhea, hematochezia, melena.  She complains of abdominal pain with her vomiting.  She complains of her chronic headaches for which she takes Fioricet. In the ED, the patient was afebrile hemodynamically stable with oxygen saturation 100% room air.  WBC 16.3, hemoglobin 14.4, platelets 213,000.  UA showed 11-20 WBC.  UDS was positive for benzodiazepines, opioids, and THC.  CT of the abdomen and pelvis was negative for any acute findings.  Sodium 136, potassium 3.9, bicarbonate 22, serum creatinine 0.1.  LFTs were unremarkable.  Lipase 24.   Assessment/Plan:  Intractable nausea and vomiting -Multifactorial including reflux esophagitis/erosive gastritis/cannabis hyperemesis syndrome -Continue pantoprazole twice daily -IVF -continues to have n/v -Continue Carafate -Zofran around-the-clock -Lipase 24 -12/16/2022 CT abdomen negative for any acute findings -11/21/2021 EGD--erosive gastropathy without any stigmata of bleeding, normal esophagus and duodenum  Leukemoid reaction -Suspect stress demargination -Patient is afebrile hemodynamically stable  Acute bronchitis -Start duo nebs during hospitalization -stable on RA -albuterol HFA    Anxiety/depression -Continue sertraline   Tobacco abuse -NicoDerm patch deferred -Cessation discussed  Chronic pain syndrome/opioid dependence -PDMP reviewed -Percocet 10/325, #120 refilled monthly -Clonazepam 1 mg, #120 refilled monthly -Ambien 10 mg, #15 filled monthly  Hypokalemia -replete -check mag -add KCL to IVF   Family Communication:   no Family at bedside  Consultants:  none  Code Status:  FULL   DVT Prophylaxis:  [place] Lovenox   Procedures: As Listed in Progress Note Above  Antibiotics: None    Total time spent 50 minutes.  Greater than 50% spent face to face counseling and coordinating care.   Subjective: She continues have nausea and vomiting.  She denies any chest pain, shortness of breath, hemoptysis.  She has a nonproductive cough.  She denies any hematochezia, melena.  Objective: Vitals:   12/16/22 2330 12/17/22 0120 12/17/22 0500 12/17/22 0700  BP: 134/69 130/66 122/78 113/68  Pulse: (!) 51 61 68 68  Resp: 18 18 18 20   Temp: (!) 97.4 F (36.3 C) 97.6 F (36.4 C) 97.9 F (36.6 C) 98.3 F (36.8 C)  TempSrc:  Oral Oral Oral  SpO2: 100% 100% 100% 98%  Weight: 83.3 kg     Height: 5\' 8"  (1.727 m)       Intake/Output Summary (Last 24 hours) at 12/17/2022 [id number] Last data filed at 12/17/2022 0500 Gross per 24 hour  Intake 270 ml  Output --  Net 270 ml   Weight change:  Exam:  General:  Pt is alert, follows commands appropriately, not in acute distress HEENT: No icterus, No thrush, No neck mass, [place]/AT Cardiovascular: RRR, S1/S2, no rubs, no gallops Respiratory: Bibasilar rales.  No wheezing.  Good air movement. Abdomen: Soft/+BS, non tender, non distended, no guarding Extremities: No edema, No lymphangitis,  No petechiae, No rashes, no synovitis   Data Reviewed: I have personally reviewed following labs and imaging studies Basic Metabolic Panel: Recent Labs  Lab 12/16/22 1355 12/17/22 0412  NA 136 136  K 3.9 3.3*  CL 106 101   CO2 22 23  GLUCOSE 140* 105*  BUN 8 9  CREATININE 0.81 0.80  CALCIUM 9.5 9.5  MG  --  1.9  PHOS  --  3.3   Liver Function Tests: Recent Labs  Lab 12/16/22 1355 12/17/22 0412  AST 16 16  ALT 12 12  ALKPHOS 67 67  BILITOT 0.5 0.6  PROT 7.6 7.4  ALBUMIN 4.3 4.1   Recent Labs  Lab 12/16/22 1355  LIPASE 24   No results for input(s): "AMMONIA" in the last 168 hours. Coagulation Profile: Recent Labs  Lab 12/16/22 1355  INR 1.1   CBC: Recent Labs  Lab 12/16/22 1355 12/17/22 0412  WBC 18.2* 16.3*  NEUTROABS 15.8*  --   HGB 15.1* 14.4  HCT 43.4 42.8  MCV 94.6 95.3  PLT 248 213   Cardiac Enzymes: No results for input(s): "CKTOTAL", "CKMB", "CKMBINDEX", "TROPONINI" in the last 168 hours. BNP: Invalid input(s): "POCBNP" CBG: No results for input(s): "GLUCAP" in the last 168 hours. HbA1C: No results for input(s): "HGBA1C" in the last 72 hours. Urine analysis:    Component Value Date/Time   COLORURINE YELLOW 12/16/2022 1604   APPEARANCEUR HAZY (A) 12/16/2022 1604   APPEARANCEUR Cloudy (A) 09/06/2022 1400   LABSPEC 1.021 12/16/2022 1604   PHURINE 6.0 12/16/2022 1604   GLUCOSEU NEGATIVE 12/16/2022 1604   HGBUR NEGATIVE 12/16/2022 1604   BILIRUBINUR NEGATIVE 12/16/2022 1604   BILIRUBINUR Negative 09/06/2022 1400   KETONESUR 5 (A) 12/16/2022 1604   PROTEINUR NEGATIVE 12/16/2022 1604   UROBILINOGEN 0.2 05/14/2015 0028   NITRITE NEGATIVE 12/16/2022 1604   LEUKOCYTESUR SMALL (A) 12/16/2022 1604   Sepsis Labs: @LABRCNTIP (procalcitonin:4,lacticidven:4) )No results found for this or any previous visit (from the past 240 hour(s)).   Scheduled Meds:  enoxaparin (LOVENOX) injection  40 mg Subcutaneous Q24H   pantoprazole (PROTONIX) IV  40 mg Intravenous Q12H   sertraline  200 mg Oral Daily   sucralfate  1 g Oral TID WC & HS   Continuous Infusions:  Procedures/Studies: CT ABDOMEN PELVIS W CONTRAST  Result Date: 12/16/2022 CLINICAL DATA:  Lower abdominal pain,  known Bartholin's cyst. EXAM: CT ABDOMEN AND PELVIS WITH CONTRAST TECHNIQUE: Multidetector CT imaging of the abdomen and pelvis was performed using the standard protocol following bolus administration of intravenous contrast. RADIATION DOSE REDUCTION: This exam was performed according to the departmental dose-optimization program which includes automated exposure control, adjustment of the mA and/or kV according to patient size and/or use of iterative reconstruction technique. CONTRAST:  16mL OMNIPAQUE IOHEXOL 300 MG/ML  SOLN COMPARISON:  CT abdomen pelvis 06/20/2022 FINDINGS: Lower chest: No acute abnormality. Hepatobiliary: No focal liver abnormality is seen. Status post cholecystectomy. No new biliary dilatation. Pancreas: Unremarkable. No pancreatic ductal dilatation or surrounding inflammatory changes. Spleen: Normal in size without focal abnormality. Adrenals/Urinary Tract: Adrenal glands are unremarkable. Kidneys are normal, without renal calculi, focal lesion, or hydronephrosis. Bladder is unremarkable. Stomach/Bowel: Stomach is within normal limits. Appendix appears normal. No evidence of bowel wall thickening, distention, or inflammatory changes. Vascular/Lymphatic: No significant vascular findings are present. No enlarged abdominal or pelvic lymph nodes. Reproductive: Uterus and bilateral adnexa are unremarkable. There are two adjacent left Bartholin's gland cysts, the more superior and larger cyst measures 2.8 x 2.2 cm (  series 2, image 85). The smaller and more inferior cyst measures 1.6 x 1.5 cm (series 2, image 89). A catheter appears to be draining the more inferior cyst. Other: No abdominal wall hernia or abnormality. No abdominopelvic ascites. Musculoskeletal: No acute or significant osseous findings. IMPRESSION: 1. No acute intra-abdominal pathology. 2. Two adjacent left Bartholin's gland cysts, the more superior and larger cyst measures 2.8 x 2.2 cm. The smaller and more inferior cyst measures  1.6 x 1.5 cm. A catheter appears to be draining the more inferior cyst. Electronically Signed   By: Audie Pinto M.D.   On: 12/16/2022 17:41    Orson Eva, DO  [facility]  If 7PM-7AM, please contact night-coverage [email or web address] Password [email or web address] 12/17/2022, 8:11 AM   LOS: 1 day

## 2022-12-17 NOTE — TOC Progression Note (Signed)
  Transition of Care [facility]) Screening Note   Patient Details  Name: Debbie Santana Date of Birth: March 17, 1991   Transition of Care [facility]) CM/SW Contact:    Boneta Lucks, RN Phone Number: 12/17/2022, 11:26 AM    Transition of Care Department [facility]) has reviewed patient and no TOC needs have been identified at this time. We will continue to monitor patient advancement through interdisciplinary progression rounds. If new patient transition needs arise, please place a TOC consult.    Expected Discharge Plan: Home/Self Care Barriers to Discharge: Continued Medical Work up  Expected Discharge Plan and Services       Living arrangements for the past 2 months: Single Family Home                   Social Determinants of Health (SDOH) Interventions SDOH Screenings   Food Insecurity: Food Insecurity Present (06/13/2022)  Housing: Low Risk  (06/13/2022)  Transportation Needs: No Transportation Needs (06/13/2022)  Utilities: Not At Risk (06/13/2022)  Alcohol Screen: Low Risk  (06/13/2022)  Depression (PHQ2-9): High Risk (06/13/2022)  Financial Resource Strain: Low Risk  (06/13/2022)  Physical Activity: Insufficiently Active (06/13/2022)  Social Connections: Socially Isolated (06/13/2022)  Stress: Stress Concern Present (06/13/2022)  Tobacco Use: High Risk (12/16/2022)    Readmission Risk Interventions    09/11/2020    1:43 PM  Readmission Risk Prevention Plan  Transportation Screening Complete  PCP or Specialist Appt within 3-5 Days Not Complete  Not Complete comments LVM for appt. TOC to follow  [name] or [name] Complete  Social Work Consult for [name] Planning/Counseling Complete  Palliative Care Screening Complete  Medication Review [employer] photographer) Complete

## 2022-12-17 NOTE — Hospital Course (Signed)
Debbie Santana was admitted to the hospital with the working diagnosis of intractable nausea and vomiting.   32 year old female with a history of anxiety/depression, erosive esophagitis and gastritis, THC use, tobacco use, chronic back pain with lumbar radiculopathy presenting with nausea and vomiting that began in the morning of 12/16/2022.  The patient states that she continues to smoke cigarettes as well as cannabinoids.  She states that she is actually vaping the cannabinoids.  She states that she has been recently placed on a course of Bactrim for a Bartholin cyst infection.  She only had 1 more dose left.  She has had some subjective fevers and chills.  She denies any chest pain, shortness breath, hemoptysis, diarrhea, hematochezia, melena.  She complains of abdominal pain with her vomiting.  She complains of her chronic headaches for which she takes Fioricet. In the ED, the patient was afebrile hemodynamically stable with oxygen saturation 100% room air.    WBC 16.3, hemoglobin 14.4, platelets 213,000.  UA showed 11-20 WBC.  UDS was positive for benzodiazepines, opioids, and THC.   CT of the abdomen and pelvis was negative for any acute findings.  Sodium 136, potassium 3.9, bicarbonate 22, serum creatinine 0.1.  LFTs were unremarkable.  Lipase 24.  Urine analysis SG 1,021, negative protein, negative leukocytes.  Toxicology positive for benzodiazepines, opiates and THC   Patient was placed on antiemetics and supportive IV fluids.

## 2022-12-18 DIAGNOSIS — R112 Nausea with vomiting, unspecified: Secondary | ICD-10-CM | POA: Diagnosis not present

## 2022-12-18 LAB — CBC
HCT: 35.7 % — ABNORMAL LOW (ref 36.0–46.0)
Hemoglobin: 11.8 g/dL — ABNORMAL LOW (ref 12.0–15.0)
MCH: 32.2 pg (ref 26.0–34.0)
MCHC: 33.1 g/dL (ref 30.0–36.0)
MCV: 97.5 fL (ref 80.0–100.0)
Platelets: 164 10*3/uL (ref 150–400)
RBC: 3.66 MIL/uL — ABNORMAL LOW (ref 3.87–5.11)
RDW: 12.2 % (ref 11.5–15.5)
WBC: 7.6 10*3/uL (ref 4.0–10.5)
nRBC: 0 % (ref 0.0–0.2)

## 2022-12-18 LAB — BASIC METABOLIC PANEL
Anion gap: 5 (ref 5–15)
BUN: 8 mg/dL (ref 6–20)
CO2: 25 mmol/L (ref 22–32)
Calcium: 8.5 mg/dL — ABNORMAL LOW (ref 8.9–10.3)
Chloride: 108 mmol/L (ref 98–111)
Creatinine, Ser: 0.78 mg/dL (ref 0.44–1.00)
GFR, Estimated: >60 mL/min (ref >60)
Glucose, Bld: 85 mg/dL (ref 70–99)
Potassium: 3.6 mmol/L (ref 3.5–5.1)
Sodium: 138 mmol/L (ref 135–145)

## 2022-12-18 LAB — MAGNESIUM: Magnesium: 1.8 mg/dL (ref 1.7–2.4)

## 2022-12-18 MED ORDER — TRAZODONE HCL 50 MG PO TABS
50.0000 mg | ORAL_TABLET | Freq: Every day | ORAL | Status: DC
  Administered 2022-12-18: 50 mg via ORAL
  Filled 2022-12-18: qty 1

## 2022-12-18 NOTE — TOC Progression Note (Signed)
Transition of Care [facility]) - Progression Note    Patient Details  Name: GOHAR MCKEAG MRN: [id number] Date of Birth: 1990/11/04  Transition of Care [facility]) CM/SW Contact  Shade Flood, LCSW Phone Number: 12/18/2022, 10:21 AM  Clinical Narrative:     [employer] given to patient for SDOH concerns.  Expected Discharge Plan: Home/Self Care Barriers to Discharge: Continued Medical Work up  Expected Discharge Plan and Services       Living arrangements for the past 2 months: Single Family Home                                       Social Determinants of Health (SDOH) Interventions SDOH Screenings   Food Insecurity: Food Insecurity Present (06/13/2022)  Housing: Low Risk  (06/13/2022)  Transportation Needs: No Transportation Needs (06/13/2022)  Utilities: Not At Risk (06/13/2022)  Alcohol Screen: Low Risk  (06/13/2022)  Depression (PHQ2-9): High Risk (06/13/2022)  Financial Resource Strain: Low Risk  (06/13/2022)  Physical Activity: Insufficiently Active (06/13/2022)  Social Connections: Socially Isolated (06/13/2022)  Stress: Stress Concern Present (06/13/2022)  Tobacco Use: High Risk (12/16/2022)    Readmission Risk Interventions    09/11/2020    1:43 PM  Readmission Risk Prevention Plan  Transportation Screening Complete  PCP or Specialist Appt within 3-5 Days Not Complete  Not Complete comments LVM for appt. TOC to follow  [place] or [place] Complete  Social Work Consult for [place] Planning/Counseling Complete  Palliative Care Screening Complete  Medication Review Press photographer) Complete

## 2022-12-18 NOTE — Progress Notes (Signed)
[facility] PROGRESS NOTE  Debbie Santana (DOB: 25-Aug-1991) [id number] PCP: Leslie Andrea, MD Outpatient Specialists: GYN, Dr. Elonda Husky  Brief Narrative: Debbie Santana is a 32 y.o. female with a history of anxiety/depression, erosive esophagitis and gastritis, THC use, tobacco use, chronic back pain with lumbar radiculopathy on chronic opioids, and Bartholin gland cyst with catheter in place, on bactrim who presented to the [place] 3/25 with nausea and vomiting.   In the ED, she was normotensive, afebrile. WBC 16.3k. UA showed 11-20 WBC. UDS was positive for benzodiazepines, opioids, and THC.  CT of the abdomen and pelvis was negative for any acute findings. SCr 0.81. LFTs and lipase were unremarkable. IV antiemetics, pain medication and fluids initiated and patient admitted.  Subjective: Tolerated clears with nausea but no vomiting last night. Abdominal pain no worse. Has increasing bleeding and pain at Bartholin cyst site, though catheter in stable position per pt.   Objective: BP 114/67   Pulse 76   Temp 97.9 F (36.6 C)   Resp 18   Ht 5\' 8"  (1.727 m)   Wt 83.3 kg   LMP 12/08/2022   SpO2 97%   BMI 27.92 kg/m   Gen: No distress Pulm: Coarse improved with cough, no distress  CV: RRR, no MRG, or edema GI: Soft, NT, ND, +BS. GU: RN chaperone present, though exam deferred per pt preference Neuro: Alert and oriented. No new focal deficits. Ext: Warm, no deformities. Skin: No rashes, lesions or ulcers on visualized skin   Assessment & Plan: Principal Problem:   Intractable nausea and vomiting Active Problems:   Leukocytosis   Tobacco abuse   Low back pain   Anxiety and depression   Gastroesophageal reflux disease   Substance abuse (HCC)   Bartholin's gland cyst  Intractable nausea and vomiting: Multifactorial including reflux esophagitis/erosive gastritis/cannabis hyperemesis syndrome. CT abd/pelvis without acute findings. Last EGD 11/21/2021 w/erosive gastropathy.  -  Advance diet slowly to fulls - Continue PPI BID, carafate - Zofran RTC, IV medications for next 24 hours. Anticipate transition to po meds and discharge if tolerating adequate hydration/nutrition enterally.  - Continue IVF while advancing diet  Bartholin gland cyst: Catheter in good position on inferior cyst based on CT. Larger superior cyst noted on CT (pt deferred exam). Pt reports pain and bleeding.  - Has GYN f/u 4/2, will touch base with Dr. Elonda Husky for any further recommendations.   Tobacco use: Cessation counseling  THC use: Cessation counseling  Acute bronchitis: Improving.  - Nebs, nonlabored respirations. Avoid systemic steroids if able.   Leukemoid reaction: Resolved without antimicrobial Tx  Chronic pain syndrome/opioid dependence - Per PDMP review previously:  -Percocet 10/325, #120 refilled monthly. Will revert to this once tolerating po durably.  -Clonazepam 1 mg, #120 refilled monthly -Ambien 10 mg, #15 filled monthly  Hypokalemia: Improved. Continue IVF supplementation.   Normocytic anemia: Monitor in AM.   Pyuria: No urinary symptoms reported. Unremarkable bladder/kidneys on CT.   Patrecia Pour, MD [facility] [email or web address] 12/18/2022, 8:59 AM

## 2022-12-19 DIAGNOSIS — K219 Gastro-esophageal reflux disease without esophagitis: Secondary | ICD-10-CM | POA: Diagnosis not present

## 2022-12-19 DIAGNOSIS — R112 Nausea with vomiting, unspecified: Secondary | ICD-10-CM | POA: Diagnosis not present

## 2022-12-19 DIAGNOSIS — N750 Cyst of Bartholin's gland: Secondary | ICD-10-CM | POA: Diagnosis not present

## 2022-12-19 DIAGNOSIS — F32A Depression, unspecified: Secondary | ICD-10-CM | POA: Diagnosis not present

## 2022-12-19 DIAGNOSIS — F1910 Other psychoactive substance abuse, uncomplicated: Secondary | ICD-10-CM | POA: Diagnosis not present

## 2022-12-19 DIAGNOSIS — F419 Anxiety disorder, unspecified: Secondary | ICD-10-CM | POA: Diagnosis not present

## 2022-12-19 LAB — CBC
HCT: 36.6 % (ref 36.0–46.0)
Hemoglobin: 12.2 g/dL (ref 12.0–15.0)
MCH: 32.1 pg (ref 26.0–34.0)
MCHC: 33.3 g/dL (ref 30.0–36.0)
MCV: 96.3 fL (ref 80.0–100.0)
Platelets: 180 10*3/uL (ref 150–400)
RBC: 3.8 MIL/uL — ABNORMAL LOW (ref 3.87–5.11)
RDW: 12.1 % (ref 11.5–15.5)
WBC: 8.1 10*3/uL (ref 4.0–10.5)
nRBC: 0 % (ref 0.0–0.2)

## 2022-12-19 LAB — BASIC METABOLIC PANEL
Anion gap: 5 (ref 5–15)
BUN: <5 mg/dL — ABNORMAL LOW (ref 6–20)
CO2: 27 mmol/L (ref 22–32)
Calcium: 8.8 mg/dL — ABNORMAL LOW (ref 8.9–10.3)
Chloride: 106 mmol/L (ref 98–111)
Creatinine, Ser: 0.76 mg/dL (ref 0.44–1.00)
GFR, Estimated: >60 mL/min (ref >60)
Glucose, Bld: 82 mg/dL (ref 70–99)
Potassium: 3.7 mmol/L (ref 3.5–5.1)
Sodium: 138 mmol/L (ref 135–145)

## 2022-12-19 MED ORDER — OXYCODONE-ACETAMINOPHEN 10-325 MG PO TABS: 1.0000 | ORAL_TABLET | ORAL | Status: DC

## 2022-12-19 MED ORDER — PANTOPRAZOLE SODIUM 40 MG PO TBEC: 40.0000 mg | DELAYED_RELEASE_TABLET | Freq: Every day | ORAL | Status: DC

## 2022-12-19 MED ORDER — TIZANIDINE HCL 4 MG PO TABS: 4.0000 mg | ORAL_TABLET | Freq: Every evening | ORAL | Status: DC | PRN

## 2022-12-19 MED ORDER — ONDANSETRON HCL 4 MG PO TABS
8.0000 mg | ORAL_TABLET | Freq: Three times a day (TID) | ORAL | Status: DC | PRN
  Administered 2022-12-19: 8 mg via ORAL
  Filled 2022-12-19: qty 2

## 2022-12-19 MED ORDER — OXYCODONE-ACETAMINOPHEN 5-325 MG PO TABS
1.0000 | ORAL_TABLET | Freq: Four times a day (QID) | ORAL | Status: DC | PRN
  Administered 2022-12-19: 2 via ORAL
  Filled 2022-12-19: qty 1

## 2022-12-19 MED ORDER — PANTOPRAZOLE SODIUM 40 MG PO TBEC: 40.0000 mg | DELAYED_RELEASE_TABLET | Freq: Every day | ORAL | 0 refills | Status: DC

## 2022-12-19 MED ORDER — OXYCODONE-ACETAMINOPHEN 5-325 MG PO TABS
2.0000 | ORAL_TABLET | ORAL | Status: DC | PRN
  Filled 2022-12-19: qty 2

## 2022-12-19 MED ORDER — OXYCODONE HCL 5 MG PO TABS: 10.0000 mg | ORAL_TABLET | ORAL | Status: DC | PRN

## 2022-12-19 MED ORDER — CARIPRAZINE HCL 1.5 MG PO CAPS
1.5000 mg | ORAL_CAPSULE | Freq: Every day | ORAL | Status: DC
  Administered 2022-12-19: 1.5 mg via ORAL
  Filled 2022-12-19 (×2): qty 1

## 2022-12-19 MED ORDER — PANTOPRAZOLE SODIUM 40 MG PO TBEC: 40.0000 mg | DELAYED_RELEASE_TABLET | Freq: Two times a day (BID) | ORAL | Status: DC

## 2022-12-19 MED ORDER — SUCRALFATE 1 G PO TABS: 1.0000 g | ORAL_TABLET | Freq: Three times a day (TID) | ORAL | Status: DC

## 2022-12-19 MED ORDER — OXYCODONE HCL 5 MG PO TABS: 5.0000 mg | ORAL_TABLET | Freq: Four times a day (QID) | ORAL | Status: DC | PRN

## 2022-12-19 NOTE — Discharge Summary (Signed)
Physician Discharge Summary   Patient: Debbie Santana MRN: [id number] DOB: 1990/10/29  Admit date:     12/16/2022  Discharge date: 12/19/22  Discharge Physician: Jimmy Picket [name]   PCP: Leslie Andrea, MD   Recommendations at discharge:    Added pantoprazole to take 40 mg daily.  Resume home analgesic regimen Follow up with Dr [name] in 7 to 10 days.   Discharge Diagnoses: Principal Problem:   Intractable nausea and vomiting Active Problems:   Gastroesophageal reflux disease   Leukocytosis   Low back pain   Anxiety and depression   Substance abuse ([place])   Bartholin's gland cyst  Resolved Problems:   * No resolved hospital problems. [facility] Course: Debbie Santana was admitted to the hospital with the working diagnosis of intractable nausea and vomiting.   32 year old female with a history of anxiety/depression, erosive esophagitis and gastritis, THC use, tobacco use, chronic back pain with lumbar radiculopathy presenting with nausea and vomiting that began in the morning of 12/16/2022.  The patient states that she continues to smoke cigarettes as well as cannabinoids.  She states that she is actually vaping the cannabinoids.  She states that she has been recently placed on a course of Bactrim for a Bartholin cyst infection.  She only had 1 more dose left.  She has had some subjective fevers and chills.  She denies any chest pain, shortness breath, hemoptysis, diarrhea, hematochezia, melena.  She complains of abdominal pain with her vomiting.  She complains of her chronic headaches for which she takes Fioricet. In the ED, the patient was afebrile hemodynamically stable with oxygen saturation 100% room air.    WBC 16.3, hemoglobin 14.4, platelets 213,000.  UA showed 11-20 WBC.  UDS was positive for benzodiazepines, opioids, and THC.   CT of the abdomen and pelvis was negative for any acute findings.  Sodium 136, potassium 3.9, bicarbonate 22, serum creatinine 0.1.  LFTs were  unremarkable.  Lipase 24.  Urine analysis SG 1,021, negative protein, negative leukocytes.  Toxicology positive for benzodiazepines, opiates and THC   Patient was placed on antiemetics and supportive IV fluids.     Assessment and Plan: * Intractable nausea and vomiting Improved vomiting, continue to have mild nausea.   Plan to transition to oral antiemetics and advance diet to regular. Zofran 8 mg as needed.  Hold on IV fluids. Resume oral analgesics and antiacids, (pantoprazole and sucralfate).  If persistent nausea can add scheduled metoclopramide.   Gastroesophageal reflux disease Continue antiacid therapy with pantoprazole and sucralfate. Antiemetics with as needed zofran.   Leukocytosis Reactive leukocytosis Continue to hold on antibiotic therapy, no signs of bacterial infection.   Low back pain Resume home regimen of oral analgesics   Anxiety and depression Continue with clonazepan, resume cariprazine.  On sertraline and tizanidine.   Substance abuse ([name]) Follow up as outpatient.   Bartholin's gland cyst Follow up as outpatient.          Consultants: none  Procedures performed: none   Disposition: Home Diet recommendation:  Discharge Diet Orders (From admission, onward)     Start     Ordered   12/19/22 0000  Diet - low sodium heart healthy        12/19/22 1548           Regular diet DISCHARGE MEDICATION: Allergies as of 12/19/2022       Reactions   Tape Other (See Comments)   Blisters   Buspirone    Fluoxetine  Nsaids Other (See Comments)   History of ulcers        Medication List     STOP taking these medications    Aimovig 70 MG/ML Soaj Generic drug: Erenumab-aooe   baclofen 20 MG tablet Commonly known as: LIORESAL       TAKE these medications    albuterol 108 (90 Base) MCG/ACT inhaler Commonly known as: VENTOLIN HFA Inhale 2 puffs into the lungs every 6 (six) hours as needed for wheezing or shortness of breath.    butalbital-acetaminophen-caffeine 50-325-40 MG tablet Commonly known as: FIORICET TAKE 1 TABLET BY MOUTH EVERY 6 HOURS AS NEEDED FOR MIGRAINE HEADACHE What changed: See the new instructions.   clonazePAM 1 MG tablet Commonly known as: KLONOPIN Take 1 mg by mouth 4 (four) times daily as needed.   diphenhydrAMINE 25 MG tablet Commonly known as: BENADRYL Take 25 mg by mouth every 6 (six) hours as needed for allergies.   etonogestrel 68 MG Impl implant Commonly known as: NEXPLANON 68 mg by Subdermal route once.   hyoscyamine 0.125 MG SL tablet Commonly known as: LEVSIN SL Place 1 tablet (0.125 mg total) under the tongue every 6 (six) hours as needed.   Nurtec 75 MG Tbdp Generic drug: Rimegepant Sulfate Take 75 mg by mouth as needed (migraine).   ondansetron 8 MG tablet Commonly known as: ZOFRAN Take 8 mg by mouth every 8 (eight) hours as needed for nausea or vomiting.   oxyCODONE-acetaminophen 10-325 MG tablet Commonly known as: PERCOCET Take 1 tablet by mouth See admin instructions. Take 1 tablet 1-4 times daily   pantoprazole 40 MG tablet Commonly known as: PROTONIX Take 1 tablet (40 mg total) by mouth daily.   propranolol 10 MG tablet Commonly known as: INDERAL Take 1 tablet (10 mg total) by mouth 2 (two) times daily.   sertraline 50 MG tablet Commonly known as: ZOLOFT Take 200 mg by mouth daily.   Spiriva Respimat 2.5 MCG/ACT Aers Generic drug: Tiotropium Bromide Monohydrate Inhale 2 each into the lungs daily.   sucralfate 1 g tablet Commonly known as: Carafate Take 1 tablet (1 g total) by mouth 3 (three) times daily before meals.   sulfamethoxazole-trimethoprim 800-160 MG tablet Commonly known as: BACTRIM DS Take 1 tablet by mouth 2 (two) times daily. X 7 days   tiZANidine 4 MG tablet Commonly known as: ZANAFLEX Take 4 mg by mouth at bedtime.   Vraylar 1.5 MG capsule Generic drug: cariprazine Take 1.5 mg by mouth daily.        Discharge  Exam: Filed Weights   12/16/22 1243 12/16/22 2330  Weight: 87 kg 83.3 kg   BP 118/65   Pulse 83   Temp 98.5 F (36.9 C) (Oral)   Resp 18   Ht 5\' 8"  (1.727 m)   Wt 83.3 kg   LMP 12/08/2022   SpO2 97%   BMI 27.92 kg/m     Patient with no further vomiting, nausea has improved, she has tolerated well po with no vomiting and feels comfortable in going home today.  Neurology awake and alert ENT with mild pallor Cardiovascular with S1 and S2 present and rhythmic with no gallops, rubs or murmurs Respiratory with no rales or wheezing Abdomen with no distention, not tender to superficial palpation with no rebound or guarding.   No lower extremity edema   Condition at discharge: stable  The results of significant diagnostics from this hospitalization (including imaging, microbiology, ancillary and laboratory) are listed below for reference.  Imaging Studies: CT ABDOMEN PELVIS W CONTRAST  Result Date: 12/16/2022 CLINICAL DATA:  Lower abdominal pain, known Bartholin's cyst. EXAM: CT ABDOMEN AND PELVIS WITH CONTRAST TECHNIQUE: Multidetector CT imaging of the abdomen and pelvis was performed using the standard protocol following bolus administration of intravenous contrast. RADIATION DOSE REDUCTION: This exam was performed according to the departmental dose-optimization program which includes automated exposure control, adjustment of the mA and/or kV according to patient size and/or use of iterative reconstruction technique. CONTRAST:  128mL OMNIPAQUE IOHEXOL 300 MG/ML  SOLN COMPARISON:  CT abdomen pelvis 06/20/2022 FINDINGS: Lower chest: No acute abnormality. Hepatobiliary: No focal liver abnormality is seen. Status post cholecystectomy. No new biliary dilatation. Pancreas: Unremarkable. No pancreatic ductal dilatation or surrounding inflammatory changes. Spleen: Normal in size without focal abnormality. Adrenals/Urinary Tract: Adrenal glands are unremarkable. Kidneys are normal, without renal  calculi, focal lesion, or hydronephrosis. Bladder is unremarkable. Stomach/Bowel: Stomach is within normal limits. Appendix appears normal. No evidence of bowel wall thickening, distention, or inflammatory changes. Vascular/Lymphatic: No significant vascular findings are present. No enlarged abdominal or pelvic lymph nodes. Reproductive: Uterus and bilateral adnexa are unremarkable. There are two adjacent left Bartholin's gland cysts, the more superior and larger cyst measures 2.8 x 2.2 cm (series 2, image 85). The smaller and more inferior cyst measures 1.6 x 1.5 cm (series 2, image 89). A catheter appears to be draining the more inferior cyst. Other: No abdominal wall hernia or abnormality. No abdominopelvic ascites. Musculoskeletal: No acute or significant osseous findings. IMPRESSION: 1. No acute intra-abdominal pathology. 2. Two adjacent left Bartholin's gland cysts, the more superior and larger cyst measures 2.8 x 2.2 cm. The smaller and more inferior cyst measures 1.6 x 1.5 cm. A catheter appears to be draining the more inferior cyst. Electronically Signed   By: Audie Pinto M.D.   On: 12/16/2022 17:41    Microbiology: Results for orders placed or performed in visit on 09/06/22  Urine Culture     Status: None   Collection Time: 09/06/22  2:00 PM   Specimen: Urine   UR  Result Value Ref Range Status   Urine Culture, Routine Final report  Final   Organism ID, Bacteria Comment  Final    Comment: Culture shows less than 10,000 colony forming units of bacteria per milliliter of urine. This colony count is not generally considered to be clinically significant.     Labs: CBC: Recent Labs  Lab 12/16/22 1355 12/17/22 0412 12/18/22 0407 12/19/22 0409  WBC 18.2* 16.3* 7.6 8.1  NEUTROABS 15.8*  --   --   --   HGB 15.1* 14.4 11.8* 12.2  HCT 43.4 42.8 35.7* 36.6  MCV 94.6 95.3 97.5 96.3  PLT 248 213 164 [id number]   Basic Metabolic Panel: Recent Labs  Lab 12/16/22 1355 12/17/22 0412  12/18/22 0407 12/19/22 0409  NA 136 136 138 138  K 3.9 3.3* 3.6 3.7  CL 106 101 108 106  CO2 22 23 25 27   GLUCOSE 140* 105* 85 82  BUN 8 9 8  <5*  CREATININE 0.81 0.80 0.78 0.76  CALCIUM 9.5 9.5 8.5* 8.8*  MG  --  1.9 1.8  --   PHOS  --  3.3  --   --    Liver Function Tests: Recent Labs  Lab 12/16/22 1355 12/17/22 0412  AST 16 16  ALT 12 12  ALKPHOS 67 67  BILITOT 0.5 0.6  PROT 7.6 7.4  ALBUMIN 4.3 4.1   CBG: Recent Labs  Lab  12/17/22 0814  GLUCAP 113*    Discharge time spent: greater than 30 minutes.  Signed: Tawni Millers, MD [facility] 12/19/2022

## 2022-12-19 NOTE — Progress Notes (Signed)
Progress Note   Patient: Debbie Santana [id number] DOB: [birth year] DOA: 12/16/2022     3 DOS: the patient was seen and examined on 12/19/2022   Brief hospital course: Mrs. Blehm was admitted to the hospital with the working diagnosis of intractable nausea and vomiting.   32 year old female with a history of anxiety/depression, erosive esophagitis and gastritis, THC use, tobacco use, chronic back pain with lumbar radiculopathy presenting with nausea and vomiting that began in the morning of 12/16/2022.  The patient states that she continues to smoke cigarettes as well as cannabinoids.  She states that she is actually vaping the cannabinoids.  She states that she has been recently placed on a course of Bactrim for a Bartholin cyst infection.  She only had 1 more dose left.  She has had some subjective fevers and chills.  She denies any chest pain, shortness breath, hemoptysis, diarrhea, hematochezia, melena.  She complains of abdominal pain with her vomiting.  She complains of her chronic headaches for which she takes Fioricet. In the ED, the patient was afebrile hemodynamically stable with oxygen saturation 100% room air.    WBC 16.3, hemoglobin 14.4, platelets 213,000.  UA showed 11-20 WBC.  UDS was positive for benzodiazepines, opioids, and THC.   CT of the abdomen and pelvis was negative for any acute findings.  Sodium 136, potassium 3.9, bicarbonate 22, serum creatinine 0.1.  LFTs were unremarkable.  Lipase 24.  Urine analysis SG 1,021, negative protein, negative leukocytes.  Toxicology positive for benzodiazepines, opiates and THC   Patient was placed on antiemetics and supportive IV fluids.     Assessment and Plan: * Intractable nausea and vomiting Improved vomiting, continue to have mild nausea.   Plan to transition to oral antiemetics and advance diet to regular. Zofran 8 mg as needed.  Hold on IV fluids. Resume oral analgesics and antiacids, (pantoprazole and sucralfate).  If  persistent nausea can add scheduled metoclopramide.   Gastroesophageal reflux disease Continue antiacid therapy with pantoprazole and sucralfate. Antiemetics with as needed zofran.   Leukocytosis Reactive leukocytosis Continue to hold on antibiotic therapy, no signs of bacterial infection.   Low back pain Resume home regimen of oral analgesics   Anxiety and depression Continue with clonazepan, resume cariprazine.  On sertraline and tizanidine.   Substance abuse ([place]) Follow up as outpatient.   Bartholin's gland cyst Follow up as outpatient.         Subjective: Patient with no further vomiting, nausea has improved but not back to baseline, continue to have back pain and mild abdominal pain.   Physical Exam: Vitals:   12/18/22 1956 12/18/22 2058 12/19/22 0443 12/19/22 0708  BP:  115/70 (!) 113/57   Pulse:  69 63   Resp:  18 18   Temp:  98.2 F (36.8 C) 98.2 F (36.8 C)   TempSrc:      SpO2: 98% 96% 99% 99%  Weight:      Height:       Neurology awake and alert ENT with mild pallor Cardiovascular with S1 and S2 present and rhythmic with no gallops, rubs or murmurs Respiratory with no rales or wheezing Abdomen with no distention, not tender to superficial palpation with no rebound or guarding.   No lower extremity edema  Data Reviewed:    Family Communication: I spoke with patient's mother at the bedside, we talked in detail about patient's condition, plan of care and prognosis and all questions were addressed.   Disposition: Status is: Inpatient Remains  inpatient appropriate because: pending patient can tolerate po   Planned Discharge Destination: Home      Author: Tawni Millers, MD 12/19/2022 11:54 AM  For on call review [email or web address].

## 2022-12-19 NOTE — Assessment & Plan Note (Signed)
Follow up as outpatient.  

## 2022-12-19 NOTE — Assessment & Plan Note (Signed)
Resume home regimen of oral analgesics

## 2022-12-19 NOTE — Assessment & Plan Note (Signed)
Improved vomiting, continue to have mild nausea.   Plan to transition to oral antiemetics and advance diet to regular. Zofran 8 mg as needed.  Hold on IV fluids. Resume oral analgesics and antiacids, (pantoprazole and sucralfate).  If persistent nausea can add scheduled metoclopramide.

## 2022-12-19 NOTE — Assessment & Plan Note (Signed)
Reactive leukocytosis Continue to hold on antibiotic therapy, no signs of bacterial infection.

## 2022-12-19 NOTE — Assessment & Plan Note (Signed)
Continue with clonazepan, resume cariprazine.  On sertraline and tizanidine.

## 2022-12-19 NOTE — Assessment & Plan Note (Signed)
Continue antiacid therapy with pantoprazole and sucralfate. Antiemetics with as needed zofran.

## 2022-12-22 LAB — URINE CULTURE: Culture: 10000 — AB

## 2022-12-24 ENCOUNTER — Encounter: Payer: Self-pay | Admitting: Obstetrics & Gynecology

## 2022-12-24 ENCOUNTER — Ambulatory Visit: Payer: Medicaid Other | Admitting: Obstetrics & Gynecology

## 2022-12-24 VITALS — BP 109/72 | HR 86 | Wt 194.0 lb

## 2022-12-24 DIAGNOSIS — N751 Abscess of Bartholin's gland: Secondary | ICD-10-CM

## 2022-12-24 NOTE — Progress Notes (Signed)
Follow up appointment for Left Bartholin Gland abscess: Much improved  Chief Complaint  Patient presents with   Follow-up    Word cath removal    Blood pressure 109/72, pulse 86, weight 194 lb (88 kg), last menstrual period 12/08/2022.  No results found.  Area is non tender and absolutely no induration erythema or other areas of  fluid collection  Word catheter is removed without difficulty Mild without any discharge Recommend twice daily Q tip exploration with H2O2 to keep the skin edges apart so the Cavity will heal in to out  MEDS ordered this encounter: No orders of the defined types were placed in this encounter.   Orders for this encounter: No orders of the defined types were placed in this encounter.   Impression + Management Plan   ICD-10-CM   1. Bartholin's gland abscess, left, now resolved after Word catheter  N75.1       Follow Up: Return if symptoms worsen or fail to improve.     All questions were answered.  Past Medical History:  Diagnosis Date   Anxiety    Bradycardia 04/17/2017   Chest pain on breathing 02/22/2020   Chronic back pain    Depression    Erosive gastritis 04/11/2017   Gastric ulcer    Genital warts    GERD (gastroesophageal reflux disease)    High cholesterol    History of gestational hypertension 05/21/2018   Dx intrapartum   History of kidney stones    Intertrigo 11/16/2019   Intractable abdominal pain 04/11/2017   Intractable nausea and vomiting 12/07/2018   Intractable vomiting 05/20/2020   Irregular intermenstrual bleeding 02/19/2019   Mental disorder    Migraine without aura and without status migrainosus, not intractable 02/19/2019   Nausea 05/10/2020   Nausea & vomiting 12/07/2018   Nexplanon in place 02/19/2019   Obesity 04/17/2017   Panic disorder    Peptic ulcer disease 04/13/2017   Pinched nerve 09/24/2019   Postpartum hypertension 06/23/2018   requiring norvasc, resolved at pp visit   Sciatica     Tremor of both hands    Vaginal Pap smear, abnormal    Wheezing     Past Surgical History:  Procedure Laterality Date   BACK SURGERY     ruptured disc.   BACK SURGERY  11/2021   BIOPSY  04/14/2017   Procedure: BIOPSY;  Surgeon: Rogene Houston, MD;  [facility];  Service: Endoscopy;;  gastric   BIOPSY  05/22/2020   Procedure: BIOPSY;  Surgeon: Daneil Dolin, MD;  [facility];  Service: Endoscopy;;   BIOPSY  08/29/2020   Procedure: BIOPSY;  Surgeon: Harvel Quale, MD;  [facility];  Service: Gastroenterology;;   BIOPSY  09/09/2020   Procedure: BIOPSY;  Surgeon: Eloise Harman, DO;  [facility];  Service: Endoscopy;;  duodenum;antral   BIOPSY  11/21/2021   Procedure: BIOPSY;  Surgeon: Rogene Houston, MD;  [facility];  Service: Endoscopy;;   CERVICAL ABLATION N/A 12/02/2018   Procedure: LASER ABLATION OF CERVIX;  Surgeon: Florian Buff, MD;  Location: [facility];  Service: Gynecology;  Laterality: N/A;   CHOLECYSTECTOMY N/A 04/17/2017   Procedure: LAPAROSCOPIC CHOLECYSTECTOMY;  Surgeon: Aviva Signs, MD;  Location: [facility];  Service: General;  Laterality: N/A;   COLONOSCOPY WITH PROPOFOL N/A 08/29/2020   Procedure: COLONOSCOPY WITH PROPOFOL;  Surgeon: Harvel Quale, MD;  [facility];  Service: Gastroenterology;  Laterality: N/A;  11:15   COLPOSCOPY     ESOPHAGOGASTRODUODENOSCOPY (EGD) WITH PROPOFOL N/A 04/14/2017   Procedure: ESOPHAGOGASTRODUODENOSCOPY (EGD) WITH PROPOFOL;  Surgeon: Rogene Houston, MD;  [facility];  Service: Endoscopy;  Laterality: N/A;   ESOPHAGOGASTRODUODENOSCOPY (EGD) WITH PROPOFOL N/A 05/22/2020   Procedure: ESOPHAGOGASTRODUODENOSCOPY (EGD) WITH PROPOFOL;  Surgeon: Daneil Dolin, MD;  [facility];  Service: Endoscopy;  Laterality: N/A;   ESOPHAGOGASTRODUODENOSCOPY (EGD) WITH PROPOFOL N/A 09/09/2020   Procedure: ESOPHAGOGASTRODUODENOSCOPY  (EGD) WITH PROPOFOL;  Surgeon: Eloise Harman, DO;  [facility];  Service: Endoscopy;  Laterality: N/A;   ESOPHAGOGASTRODUODENOSCOPY (EGD) WITH PROPOFOL N/A 11/21/2020   Procedure: ESOPHAGOGASTRODUODENOSCOPY (EGD) WITH PROPOFOL;  Surgeon: Harvel Quale, MD;  [facility];  Service: Gastroenterology;  Laterality: N/A;  Am   ESOPHAGOGASTRODUODENOSCOPY (EGD) WITH PROPOFOL N/A 11/21/2021   Procedure: ESOPHAGOGASTRODUODENOSCOPY (EGD) WITH PROPOFOL;  Surgeon: Rogene Houston, MD;  [facility];  Service: Endoscopy;  Laterality: N/A;  820   HERNIA REPAIR Bilateral    inguinal   LASER ABLATION CONDOLAMATA N/A 12/02/2018   Procedure: LASER ABLATION CONDYLOMA ACCUMINATA LEFT AND RIGHT VULVA, PERINEUM AND PERIANAL (15 TOTAL);  Surgeon: Florian Buff, MD;  Location: [facility];  Service: Gynecology;  Laterality: N/A;   LUMBAR LAMINECTOMY/DECOMPRESSION MICRODISCECTOMY Right 12/03/2021   Procedure: MICRODISCECTOMY Lumbar four- five;  Surgeon: Newman Pies, MD;  Location: [facility];  Service: Neurosurgery;  Laterality: Right;   MOUTH SURGERY  09/2021   All top teeth and all wisdom teeth removed   MOUTH SURGERY  12/2021   POLYPECTOMY  08/29/2020   Procedure: POLYPECTOMY;  Surgeon: Montez Morita, Quillian Quince, MD;  [facility];  Service: Gastroenterology;;    OB History     Gravida  2   Para  1   Term  1   Preterm      AB  1   Living  1      SAB  1   IAB      Ectopic      Multiple  0   Live Births  1           Allergies  Allergen Reactions   Tape Other (See Comments)    Blisters   Buspirone    Fluoxetine    Nsaids Other (See Comments)    History of ulcers    Social History   Socioeconomic History   Marital status: Single    Spouse name: Not on file   Number of children: 1   Years of education: Not on file   Highest education level: Not on file  Occupational History   Not on file  Tobacco Use   Smoking  status: Every Day    Packs/day: 1.00    Years: 10.00    Additional pack years: 0.00    Total pack years: 10.00    Types: Cigarettes    Passive exposure: Current   Smokeless tobacco: Never  Vaping Use   Vaping Use: Former  Substance and Sexual Activity   Alcohol use: No   Drug use: No   Sexual activity: Yes    Birth control/protection: Implant  Other Topics Concern   Not on file  Social History Narrative   Lives with son-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      3 dogs and 3 cats      Enjoy: crafts      Diet: eats all food groups -  acidic foods   Caffeine: stopped in last week or so   Water: 6-8 cups daily       Wears seat belt   Does not drive   Smoke detectors at home   Data processing manager    No Weapons    Social Determinants of Health   Financial Resource Strain: Low Risk  (06/13/2022)   Overall Financial Resource Strain (CARDIA)    Difficulty of Paying Living Expenses: Not very hard  Food Insecurity: Food Insecurity Present (06/13/2022)   Hunger Vital Sign    Worried About Running Out of Food in the Last Year: Sometimes true    Ran Out of Food in the Last Year: Sometimes true  Transportation Needs: No Transportation Needs (06/13/2022)   PRAPARE - Hydrologist (Medical): No    Lack of Transportation (Non-Medical): No  Physical Activity: Insufficiently Active (06/13/2022)   Exercise Vital Sign    Days of Exercise per Week: 2 days    Minutes of Exercise per Session: 50 min  Stress: Stress Concern Present (06/13/2022)   [place]    Feeling of Stress : Rather much  Social Connections: Socially Isolated (06/13/2022)   Social Connection and Isolation Panel [NHANES]    Frequency of Communication with Friends and Family: Once a week    Frequency of Social Gatherings with Friends and Family: Never    Attends Religious Services: Never    Museum/gallery conservator or Organizations: No    Attends Music therapist: Never    Marital Status: Never married    Family History  Problem Relation Age of Onset   Anxiety disorder Mother    Hyperlipidemia Mother    Crohn's disease Sister    Diabetes Maternal Grandfather    Diabetes Cousin    Learning disabilities Cousin

## 2022-12-25 ENCOUNTER — Ambulatory Visit (HOSPITAL_COMMUNITY)
Admission: RE | Admit: 2022-12-25 | Discharge: 2022-12-25 | Disposition: A | Discharge status: Home / Self Care | Payer: Medicaid Other | Source: Ambulatory Visit | Attending: Neurosurgery | Admitting: Neurosurgery

## 2022-12-25 ENCOUNTER — Ambulatory Visit (HOSPITAL_COMMUNITY)
Admission: RE | Admit: 2022-12-25 | Discharge: 2022-12-25 | Disposition: A | Discharge status: Home / Self Care | Payer: Medicaid Other | Source: Ambulatory Visit | Attending: Psychiatry | Admitting: Psychiatry

## 2022-12-25 DIAGNOSIS — Z8711 Personal history of peptic ulcer disease: Secondary | ICD-10-CM | POA: Diagnosis not present

## 2022-12-25 DIAGNOSIS — M5116 Intervertebral disc disorders with radiculopathy, lumbar region: Secondary | ICD-10-CM | POA: Diagnosis not present

## 2022-12-25 DIAGNOSIS — G4700 Insomnia, unspecified: Secondary | ICD-10-CM | POA: Diagnosis not present

## 2022-12-25 DIAGNOSIS — M545 Low back pain, unspecified: Secondary | ICD-10-CM | POA: Diagnosis not present

## 2022-12-25 DIAGNOSIS — M5451 Vertebrogenic low back pain: Secondary | ICD-10-CM | POA: Diagnosis not present

## 2022-12-25 DIAGNOSIS — R270 Ataxia, unspecified: Secondary | ICD-10-CM | POA: Insufficient documentation

## 2022-12-25 DIAGNOSIS — Z79891 Long term (current) use of opiate analgesic: Secondary | ICD-10-CM | POA: Diagnosis not present

## 2022-12-25 DIAGNOSIS — R251 Tremor, unspecified: Secondary | ICD-10-CM | POA: Diagnosis not present

## 2022-12-25 MED ORDER — GADOBUTROL 1 MMOL/ML IV SOLN
10.0000 mL | Freq: Once | INTRAVENOUS | Status: AC | PRN
  Administered 2022-12-25: 10 mL via INTRAVENOUS

## 2023-01-13 ENCOUNTER — Emergency Department (HOSPITAL_COMMUNITY): Payer: Medicaid Other

## 2023-01-13 ENCOUNTER — Other Ambulatory Visit: Payer: Self-pay

## 2023-01-13 ENCOUNTER — Encounter (HOSPITAL_COMMUNITY): Payer: Self-pay

## 2023-01-13 ENCOUNTER — Emergency Department (HOSPITAL_COMMUNITY)
Admission: EM | Admit: 2023-01-13 | Discharge: 2023-01-13 | Disposition: A | Discharge status: Home / Self Care | Payer: Medicaid Other | Attending: Emergency Medicine | Admitting: Emergency Medicine

## 2023-01-13 DIAGNOSIS — R1084 Generalized abdominal pain: Secondary | ICD-10-CM | POA: Diagnosis not present

## 2023-01-13 DIAGNOSIS — M545 Low back pain, unspecified: Secondary | ICD-10-CM | POA: Insufficient documentation

## 2023-01-13 DIAGNOSIS — Z79899 Other long term (current) drug therapy: Secondary | ICD-10-CM | POA: Insufficient documentation

## 2023-01-13 DIAGNOSIS — F1193 Opioid use, unspecified with withdrawal: Secondary | ICD-10-CM | POA: Insufficient documentation

## 2023-01-13 DIAGNOSIS — M549 Dorsalgia, unspecified: Secondary | ICD-10-CM | POA: Diagnosis not present

## 2023-01-13 DIAGNOSIS — R109 Unspecified abdominal pain: Secondary | ICD-10-CM | POA: Diagnosis not present

## 2023-01-13 DIAGNOSIS — I499 Cardiac arrhythmia, unspecified: Secondary | ICD-10-CM | POA: Diagnosis not present

## 2023-01-13 DIAGNOSIS — K219 Gastro-esophageal reflux disease without esophagitis: Secondary | ICD-10-CM | POA: Diagnosis not present

## 2023-01-13 LAB — RAPID URINE DRUG SCREEN, HOSP PERFORMED
Amphetamines: POSITIVE — AB
Barbiturates: NOT DETECTED
Benzodiazepines: POSITIVE — AB
Cocaine: NOT DETECTED
Opiates: POSITIVE — AB
Tetrahydrocannabinol: POSITIVE — AB

## 2023-01-13 LAB — CBC WITH DIFFERENTIAL/PLATELET
Abs Immature Granulocytes: 0.1 10*3/uL — ABNORMAL HIGH (ref 0.00–0.07)
Basophils Absolute: 0 10*3/uL (ref 0.0–0.1)
Basophils Relative: 0 %
Eosinophils Absolute: 0 10*3/uL (ref 0.0–0.5)
Eosinophils Relative: 0 %
HCT: 41.4 % (ref 36.0–46.0)
Hemoglobin: 14.4 g/dL (ref 12.0–15.0)
Immature Granulocytes: 1 %
Lymphocytes Relative: 4 %
Lymphs Abs: 0.8 10*3/uL (ref 0.7–4.0)
MCH: 32.6 pg (ref 26.0–34.0)
MCHC: 34.8 g/dL (ref 30.0–36.0)
MCV: 93.7 fL (ref 80.0–100.0)
Monocytes Absolute: 0.4 10*3/uL (ref 0.1–1.0)
Monocytes Relative: 2 %
Neutro Abs: 18.5 10*3/uL — ABNORMAL HIGH (ref 1.7–7.7)
Neutrophils Relative %: 93 %
Platelets: 217 10*3/uL (ref 150–400)
RBC: 4.42 MIL/uL (ref 3.87–5.11)
RDW: 12.5 % (ref 11.5–15.5)
WBC: 19.9 10*3/uL — ABNORMAL HIGH (ref 4.0–10.5)
nRBC: 0 % (ref 0.0–0.2)

## 2023-01-13 LAB — COMPREHENSIVE METABOLIC PANEL
ALT: 12 U/L (ref 0–44)
AST: 16 U/L (ref 15–41)
Albumin: 4.3 g/dL (ref 3.5–5.0)
Alkaline Phosphatase: 55 U/L (ref 38–126)
Anion gap: 10 (ref 5–15)
BUN: 13 mg/dL (ref 6–20)
CO2: 24 mmol/L (ref 22–32)
Calcium: 9.7 mg/dL (ref 8.9–10.3)
Chloride: 104 mmol/L (ref 98–111)
Creatinine, Ser: 0.65 mg/dL (ref 0.44–1.00)
GFR, Estimated: >60 mL/min (ref >60)
Glucose, Bld: 138 mg/dL — ABNORMAL HIGH (ref 70–99)
Potassium: 3.4 mmol/L — ABNORMAL LOW (ref 3.5–5.1)
Sodium: 138 mmol/L (ref 135–145)
Total Bilirubin: 0.9 mg/dL (ref 0.3–1.2)
Total Protein: 7.4 g/dL (ref 6.5–8.1)

## 2023-01-13 LAB — URINALYSIS, ROUTINE W REFLEX MICROSCOPIC
Bacteria, UA: NONE SEEN
Bilirubin Urine: NEGATIVE
Glucose, UA: NEGATIVE mg/dL
Hgb urine dipstick: NEGATIVE
Ketones, ur: 80 mg/dL — AB
Nitrite: NEGATIVE
Protein, ur: 100 mg/dL — AB
Specific Gravity, Urine: 1.034 — ABNORMAL HIGH (ref 1.005–1.030)
pH: 6 (ref 5.0–8.0)

## 2023-01-13 LAB — LIPASE, BLOOD: Lipase: 24 U/L (ref 11–51)

## 2023-01-13 LAB — PREGNANCY, URINE: Preg Test, Ur: NEGATIVE

## 2023-01-13 LAB — ETHANOL: Alcohol, Ethyl (B): <10 mg/dL (ref <10)

## 2023-01-13 LAB — LACTIC ACID, PLASMA: Lactic Acid, Venous: 1.4 mmol/L (ref 0.5–1.9)

## 2023-01-13 MED ORDER — HALOPERIDOL LACTATE 5 MG/ML IJ SOLN
1.0000 mg | Freq: Once | INTRAMUSCULAR | Status: AC
  Administered 2023-01-13: 1 mg via INTRAVENOUS
  Filled 2023-01-13: qty 1

## 2023-01-13 MED ORDER — ONDANSETRON 4 MG PO TBDP
4.0000 mg | ORAL_TABLET | Freq: Once | ORAL | Status: AC
  Administered 2023-01-13: 4 mg via ORAL
  Filled 2023-01-13: qty 1

## 2023-01-13 MED ORDER — OXYCODONE-ACETAMINOPHEN 5-325 MG PO TABS
1.0000 | ORAL_TABLET | Freq: Once | ORAL | Status: AC
  Administered 2023-01-13: 1 via ORAL
  Filled 2023-01-13: qty 1

## 2023-01-13 MED ORDER — METOCLOPRAMIDE HCL 5 MG/ML IJ SOLN
10.0000 mg | Freq: Once | INTRAMUSCULAR | Status: AC
  Administered 2023-01-13: 10 mg via INTRAVENOUS
  Filled 2023-01-13: qty 2

## 2023-01-13 MED ORDER — IOHEXOL 300 MG/ML  SOLN
100.0000 mL | Freq: Once | INTRAMUSCULAR | Status: AC | PRN
  Administered 2023-01-13: 100 mL via INTRAVENOUS

## 2023-01-13 MED ORDER — PROMETHAZINE HCL 25 MG RE SUPP: 25.0000 mg | Freq: Four times a day (QID) | RECTAL | 0 refills | Status: DC | PRN

## 2023-01-13 MED ORDER — PANTOPRAZOLE SODIUM 40 MG IV SOLR
40.0000 mg | Freq: Once | INTRAVENOUS | Status: AC
  Administered 2023-01-13: 40 mg via INTRAVENOUS
  Filled 2023-01-13: qty 10

## 2023-01-13 MED ORDER — SODIUM CHLORIDE 0.9 % IV BOLUS
1000.0000 mL | Freq: Once | INTRAVENOUS | Status: AC
  Administered 2023-01-13: 1000 mL via INTRAVENOUS

## 2023-01-13 MED ORDER — ONDANSETRON HCL 4 MG/2ML IJ SOLN
4.0000 mg | Freq: Once | INTRAMUSCULAR | Status: AC
  Administered 2023-01-13: 4 mg via INTRAVENOUS
  Filled 2023-01-13: qty 2

## 2023-01-13 MED ORDER — LORAZEPAM 1 MG PO TABS
1.0000 mg | ORAL_TABLET | Freq: Once | ORAL | Status: AC
  Administered 2023-01-13: 1 mg via ORAL
  Filled 2023-01-13: qty 1

## 2023-01-13 MED ORDER — HYDROMORPHONE HCL 1 MG/ML IJ SOLN
1.0000 mg | Freq: Once | INTRAMUSCULAR | Status: AC
  Administered 2023-01-13: 1 mg via INTRAVENOUS
  Filled 2023-01-13: qty 1

## 2023-01-13 NOTE — ED Notes (Addendum)
Pt came to desk wanting to take iv out so she can leave. Pt then stated she would stay and if she could have anything for nausea and pain. Advised can't have pain meds until seen by edp. Nausea med given.

## 2023-01-13 NOTE — ED Notes (Signed)
Pt and her visitors continually walk to [facility] to request medications and other items.  Pt asked multiple times to stop getting up and walking if her back is hurting d/t being a fall risk.  Pt requested and provided a warm compress.

## 2023-01-13 NOTE — ED Notes (Addendum)
Pt walked to [facility] to request more medication.  After reviewing chart, Pt was told all ordered medications have been given.  Pt then asked for a wam pack which was provided.  Will notify EDP that Pt would like more nausea medication.

## 2023-01-13 NOTE — ED Triage Notes (Signed)
Pt presents to ED via [facility] for severe back pain started this am. Pt was seen 1 week ago for stomach ulcers. CBG 137. Zofran 4 mg given PTA, 18g left AC. Pt states she started vomiting this am with intense lower back pain. Pt states she is unsure last BM.

## 2023-01-13 NOTE — Discharge Instructions (Signed)
Follow-up with your primary care provider for recheck.  You have been prescribed Phenergan suppositories.  Use these for nausea vomiting, do not take any nausea vomiting medication by mouth while using the suppositories

## 2023-01-13 NOTE — ED Provider Notes (Signed)
[place] EMERGENCY DEPARTMENT AT [facility] Provider Note   CSN: [id number] Arrival date & time: 01/13/23  0930     History  Chief Complaint  Patient presents with   Back Pain    Debbie Santana is a 32 y.o. female.   Back Pain Associated symptoms: abdominal pain   Associated symptoms: no chest pain, no dysuria, no fever and no headaches         Debbie Santana is a 32 y.o. female with past medical history of erosive gastritis, anxiety, peptic ulcer disease, chronic diarrhea, GERD, chronic pain syndrome who presents to the Emergency Department complaining of diffuse low back pain that started on the morning of arrival.  She also complains of abdominal pain nausea and vomiting.  States she has been diagnosed with ulcers of her stomach.  She has been unable to tolerate any foods or liquids.  Back pain radiates across her lower back.  Denies any pain numbness or weakness of her lower extremities.  No fever or chills.  Believes she may be constipated as she cannot recall her last BM.  Also states that she ran out of her pain medication stating that she would take it but then "throw it back up."  Home Medications Prior to Admission medications   Medication Sig Start Date End Date Taking? Authorizing Provider  albuterol (VENTOLIN HFA) 108 (90 Base) MCG/ACT inhaler Inhale 2 puffs into the lungs every 6 (six) hours as needed for wheezing or shortness of breath. Patient not taking: Reported on 12/24/2022 09/11/20   Catarina Hartshorn, MD  butalbital-acetaminophen-caffeine (FIORICET) 50-325-40 MG tablet TAKE 1 TABLET BY MOUTH EVERY 6 HOURS AS NEEDED FOR MIGRAINE HEADACHE Patient not taking: Reported on 12/24/2022 12/04/20   Adline Potter, NP  clonazePAM (KLONOPIN) 1 MG tablet Take 1 mg by mouth 4 (four) times daily as needed. 10/23/22   [provider]  diphenhydrAMINE (BENADRYL) 25 MG tablet Take 25 mg by mouth every 6 (six) hours as needed for allergies.    [provider]  etonogestrel (NEXPLANON) 68 MG IMPL implant 68 mg by Subdermal route once.    [provider]  hyoscyamine (LEVSIN SL) 0.125 MG SL tablet Place 1 tablet (0.125 mg total) under the tongue every 6 (six) hours as needed. 05/07/22   Carlan, Chelsea L, NP  NURTEC 75 MG TBDP Take 75 mg by mouth as needed (migraine). Patient not taking: Reported on 12/24/2022 10/23/22   [provider]  ondansetron (ZOFRAN) 8 MG tablet Take 8 mg by mouth every 8 (eight) hours as needed for nausea or vomiting. 08/21/21   [provider]  oxyCODONE-acetaminophen (PERCOCET) 10-325 MG tablet Take 1 tablet by mouth See admin instructions. Take 1 tablet 1-4 times daily Patient not taking: Reported on 12/24/2022 06/05/22   [provider]  pantoprazole (PROTONIX) 40 MG tablet Take 1 tablet (40 mg total) by mouth daily. 12/19/22 01/18/23  Arrien, York Ram, MD  propranolol (INDERAL) 10 MG tablet Take 1 tablet (10 mg total) by mouth 2 (two) times daily. 11/12/22   Ocie Doyne, MD  sertraline (ZOLOFT) 50 MG tablet Take 200 mg by mouth daily.    [provider]  SPIRIVA RESPIMAT 2.5 MCG/ACT AERS Inhale 2 each into the lungs daily. 09/24/22   [provider]  sucralfate (CARAFATE) 1 g tablet Take 1 tablet (1 g total) by mouth 3 (three) times daily before meals. 11/06/21   Carlan, Jeral Pinch, NP  sulfamethoxazole-trimethoprim (BACTRIM DS) 800-160  MG tablet Take 1 tablet by mouth 2 (two) times daily. X 7 days Patient not taking: Reported on 12/24/2022 12/09/22   Cheral Marker, CNM  tiZANidine (ZANAFLEX) 4 MG tablet Take 4 mg by mouth at bedtime. 10/23/22   [provider]  VRAYLAR 1.5 MG capsule Take 1.5 mg by mouth daily. 09/27/22   [provider]      Allergies    Tape, Buspirone, Fluoxetine, and Nsaids    Review of Systems   Review of Systems  Constitutional:  Positive for appetite change. Negative for chills and fever.  Respiratory:  Negative  for cough and shortness of breath.   Cardiovascular:  Negative for chest pain.  Gastrointestinal:  Positive for abdominal pain, constipation, nausea and vomiting. Negative for diarrhea.  Genitourinary:  Negative for decreased urine volume, dyspareunia and dysuria.  Musculoskeletal:  Positive for back pain. Negative for neck pain and neck stiffness.  Skin:  Negative for rash and wound.  Neurological:  Negative for dizziness, light-headedness and headaches.  Psychiatric/Behavioral:  Negative for confusion and self-injury. The patient is nervous/anxious.     Physical Exam Updated Vital Signs BP (!) 142/100   Pulse (!) 51   Temp (!) 96 F (35.6 C) (Temporal)   Resp 20   Ht 5\' 8"  (1.727 m)   Wt 84.8 kg   LMP 12/30/2022   SpO2 98%   BMI 28.43 kg/m  Physical Exam Vitals and nursing note reviewed.  Constitutional:      Comments: Patient tremulous, tearful appears very anxious  HENT:     Mouth/Throat:     Mouth: Mucous membranes are moist.  Eyes:     Extraocular Movements: Extraocular movements intact.     Pupils: Pupils are equal, round, and reactive to light.  Cardiovascular:     Rate and Rhythm: Normal rate and regular rhythm.     Pulses: Normal pulses.  Pulmonary:     Effort: Pulmonary effort is normal. No respiratory distress.  Abdominal:     Palpations: Abdomen is soft. There is no mass.     Tenderness: There is abdominal tenderness. There is no right CVA tenderness, left CVA tenderness or guarding.  Musculoskeletal:        General: Tenderness present. Normal range of motion.     Cervical back: Normal range of motion. No tenderness.     Comments: Diffuse tenderness to palpation of the bilateral lumbar paraspinal muscles.  No midline tenderness.  No bony step-offs.  Negative straight leg raise bilaterally.  Patient is ambulatory in the department with steady gait.  Skin:    General: Skin is warm.     Capillary Refill: Capillary refill takes less than 2 seconds.      Findings: No erythema or rash.  Neurological:     General: No focal deficit present.     Mental Status: She is alert.     Sensory: No sensory deficit.     Motor: No weakness.     ED Results / Procedures / Treatments   Labs (all labs ordered are listed, but only abnormal results are displayed) Labs Reviewed  CBC WITH DIFFERENTIAL/PLATELET - Abnormal; Notable for the following components:      Result Value   WBC 19.9 (*)    Neutro Abs 18.5 (*)    Abs Immature Granulocytes 0.10 (*)    All other components within normal limits  URINALYSIS, ROUTINE W REFLEX MICROSCOPIC - Abnormal; Notable for the following components:   APPearance HAZY (*)  Specific Gravity, Urine 1.034 (*)    Ketones, ur 80 (*)    Protein, ur 100 (*)    Leukocytes,Ua SMALL (*)    All other components within normal limits  COMPREHENSIVE METABOLIC PANEL - Abnormal; Notable for the following components:   Potassium 3.4 (*)    Glucose, Bld 138 (*)    All other components within normal limits  RAPID URINE DRUG SCREEN, HOSP PERFORMED - Abnormal; Notable for the following components:   Opiates POSITIVE (*)    Benzodiazepines POSITIVE (*)    Amphetamines POSITIVE (*)    Tetrahydrocannabinol POSITIVE (*)    All other components within normal limits  PREGNANCY, URINE  LIPASE, BLOOD  ETHANOL  LACTIC ACID, PLASMA    EKG EKG Interpretation  Date/Time:  Monday January 13 2023 16:05:45 EDT Ventricular Rate:  46 PR Interval:  130 QRS Duration: 81 QT Interval:  517 QTC Calculation: 453 R Axis:   71 Text Interpretation: Sinus bradycardia Atrial premature complex Confirmed by Alona Bene [phone]) on 01/14/2023 4:12:37 PM  Radiology CT ABDOMEN PELVIS W CONTRAST  Result Date: 01/13/2023 CLINICAL DATA:  Left lower quadrant pain EXAM: CT ABDOMEN AND PELVIS WITH CONTRAST TECHNIQUE: Multidetector CT imaging of the abdomen and pelvis was performed using the standard protocol following bolus administration of intravenous  contrast. RADIATION DOSE REDUCTION: This exam was performed according to the departmental dose-optimization program which includes automated exposure control, adjustment of the mA and/or kV according to patient size and/or use of iterative reconstruction technique. CONTRAST:  OMNIPAQUE IOHEXOL 300 MG/ML  SOLN COMPARISON:  12/16/2022 FINDINGS: Lower chest: No acute abnormality Hepatobiliary: No focal liver abnormality is seen. Status post cholecystectomy. No biliary dilatation. Pancreas: No focal abnormality or ductal dilatation. Spleen: No focal abnormality.  Normal size. Adrenals/Urinary Tract: No adrenal abnormality. No focal renal abnormality. No stones or hydronephrosis. Urinary bladder is unremarkable. Stomach/Bowel: Normal appendix. Stomach, large and small bowel grossly unremarkable. Vascular/Lymphatic: No evidence of aneurysm or adenopathy. Reproductive: 3.4 cm cyst in the right ovary, new since prior study compatible with functional cyst. Uterus and left ovary unremarkable. Other: No free fluid or free air. Musculoskeletal: No acute bony abnormality. IMPRESSION: No acute findings in the abdomen or pelvis. 3.4 cm functional cyst in the right ovary. No follow-up imaging recommended. Note: This recommendation does not apply to premenarchal patients and to those with increased risk (genetic, family history, elevated tumor markers or other high-risk factors) of ovarian cancer. Reference: JACR 2020 Feb; 17(2):248-254 Electronically Signed   By: Charlett Nose M.D.   On: 01/13/2023 18:54    Procedures Procedures    Medications Ordered in ED Medications  ondansetron (ZOFRAN-ODT) disintegrating tablet 4 mg (4 mg Oral Given 01/13/23 1136)    ED Course/ Medical Decision Making/ A&P                             Medical Decision Making Patient here by EMS for complaints of abdominal pain and low back pain.  Reports nausea and vomiting with history of peptic ulcer disease.  States she is unable to  tolerate solid foods or liquids.  She appears very anxious, tremulous and tearful during exam.  Differential would include but not limited to perforated peptic ulcer, GI bleed, gastritis, gastroparesis, cyclic vomiting syndrome.  Back pain felt to be acute on chronic pain.  Patient does admit to running out of her narcotic pain medication.  Clinically, I suspect some of her symptoms may  be related to opiate withdrawal   Amount and/or Complexity of Data Reviewed Labs: ordered.    Details: Labs interpreted by me, leukocytosis with white count of nearly 20,000, I suspect this is secondary to vomiting and anxiety.  Chemistries without significant derangement, lipase unremarkable.  Lactic acid unremarkable ethanol less than 10 UDS is positive for opiates benzos, amphetamines and THC urinalysis without evidence of infection urine pregnancy test is negative Radiology: ordered.    Details: CT abdomen and pelvis was ordered for further evaluation.  No acute findings of the abdomen or pelvis was seen ECG/medicine tests: ordered.    Details: EKG shows sinus bradycardia Discussion of management or test interpretation with external provider(s): Patient noted to have MRI of the lumbar spine on 12/27/2022, results were reviewed by me.  Shows lumbar spondylosis and postop changes.  Some disc degeneration at L3 and 4  Patient has required multiple doses of antiemetic and pain medication.  After receiving medications and IV fluids, patient now appears more calm resting comfortably.  Patient's friend at bedside.  She is now tolerating oral fluids and a snack.  Feels appropriate for discharge home, database was reviewed, patient recently received prescription for opiate pain medication.  Patient understands that I will not be providing prescription pain medication at this time.  She is advised to follow-up with her pain management provider.   Risk Prescription drug management.           Final Clinical  Impression(s) / ED Diagnoses Final diagnoses:  Generalized abdominal pain  Opiate withdrawal    Rx / DC Orders ED Discharge Orders     None         Pauline Aus, PA-C 01/16/23 1140    Gloris Manchester, MD 01/18/23 1537

## 2023-01-16 ENCOUNTER — Other Ambulatory Visit: Payer: Self-pay

## 2023-01-16 ENCOUNTER — Emergency Department (HOSPITAL_COMMUNITY)
Admission: EM | Admit: 2023-01-16 | Discharge: 2023-01-16 | Disposition: A | Discharge status: Home / Self Care | Payer: Medicaid Other | Attending: Emergency Medicine | Admitting: Emergency Medicine

## 2023-01-16 ENCOUNTER — Encounter (HOSPITAL_COMMUNITY): Payer: Self-pay

## 2023-01-16 DIAGNOSIS — R1111 Vomiting without nausea: Secondary | ICD-10-CM | POA: Diagnosis not present

## 2023-01-16 DIAGNOSIS — R110 Nausea: Secondary | ICD-10-CM | POA: Diagnosis not present

## 2023-01-16 DIAGNOSIS — N390 Urinary tract infection, site not specified: Secondary | ICD-10-CM | POA: Diagnosis not present

## 2023-01-16 DIAGNOSIS — I10 Essential (primary) hypertension: Secondary | ICD-10-CM | POA: Diagnosis not present

## 2023-01-16 DIAGNOSIS — R1084 Generalized abdominal pain: Secondary | ICD-10-CM

## 2023-01-16 DIAGNOSIS — N3001 Acute cystitis with hematuria: Secondary | ICD-10-CM | POA: Diagnosis not present

## 2023-01-16 LAB — URINALYSIS, ROUTINE W REFLEX MICROSCOPIC
Bilirubin Urine: NEGATIVE
Glucose, UA: NEGATIVE mg/dL
Hgb urine dipstick: NEGATIVE
Ketones, ur: NEGATIVE mg/dL
Nitrite: NEGATIVE
Protein, ur: NEGATIVE mg/dL
Specific Gravity, Urine: 1.012 (ref 1.005–1.030)
pH: 7 (ref 5.0–8.0)

## 2023-01-16 LAB — COMPREHENSIVE METABOLIC PANEL
ALT: 19 U/L (ref 0–44)
AST: 16 U/L (ref 15–41)
Albumin: 4.2 g/dL (ref 3.5–5.0)
Alkaline Phosphatase: 54 U/L (ref 38–126)
Anion gap: 8 (ref 5–15)
BUN: 7 mg/dL (ref 6–20)
CO2: 24 mmol/L (ref 22–32)
Calcium: 9.2 mg/dL (ref 8.9–10.3)
Chloride: 106 mmol/L (ref 98–111)
Creatinine, Ser: 0.67 mg/dL (ref 0.44–1.00)
GFR, Estimated: >60 mL/min (ref >60)
Glucose, Bld: 117 mg/dL — ABNORMAL HIGH (ref 70–99)
Potassium: 3.3 mmol/L — ABNORMAL LOW (ref 3.5–5.1)
Sodium: 138 mmol/L (ref 135–145)
Total Bilirubin: 0.1 mg/dL — ABNORMAL LOW (ref 0.3–1.2)
Total Protein: 6.9 g/dL (ref 6.5–8.1)

## 2023-01-16 LAB — POC URINE PREG, ED: Preg Test, Ur: NEGATIVE

## 2023-01-16 LAB — CBC
HCT: 41.8 % (ref 36.0–46.0)
Hemoglobin: 14.5 g/dL (ref 12.0–15.0)
MCH: 32.6 pg (ref 26.0–34.0)
MCHC: 34.7 g/dL (ref 30.0–36.0)
MCV: 93.9 fL (ref 80.0–100.0)
Platelets: 202 10*3/uL (ref 150–400)
RBC: 4.45 MIL/uL (ref 3.87–5.11)
RDW: 12.5 % (ref 11.5–15.5)
WBC: 14.7 10*3/uL — ABNORMAL HIGH (ref 4.0–10.5)
nRBC: 0 % (ref 0.0–0.2)

## 2023-01-16 LAB — LIPASE, BLOOD: Lipase: 24 U/L (ref 11–51)

## 2023-01-16 MED ORDER — ALUM & MAG HYDROXIDE-SIMETH 200-200-20 MG/5ML PO SUSP
30.0000 mL | Freq: Once | ORAL | Status: AC
  Administered 2023-01-16: 30 mL via ORAL
  Filled 2023-01-16: qty 30

## 2023-01-16 MED ORDER — DICYCLOMINE HCL 20 MG PO TABS: 20.0000 mg | ORAL_TABLET | Freq: Two times a day (BID) | ORAL | 0 refills | Status: DC

## 2023-01-16 MED ORDER — ONDANSETRON HCL 4 MG/2ML IJ SOLN
4.0000 mg | Freq: Once | INTRAMUSCULAR | Status: AC
  Administered 2023-01-16: 4 mg via INTRAVENOUS
  Filled 2023-01-16: qty 2

## 2023-01-16 MED ORDER — PANTOPRAZOLE SODIUM 40 MG IV SOLR
40.0000 mg | Freq: Once | INTRAVENOUS | Status: AC
  Administered 2023-01-16: 40 mg via INTRAVENOUS
  Filled 2023-01-16: qty 10

## 2023-01-16 MED ORDER — FENTANYL CITRATE PF 50 MCG/ML IJ SOSY
50.0000 ug | PREFILLED_SYRINGE | Freq: Once | INTRAMUSCULAR | Status: AC
  Administered 2023-01-16: 50 ug via INTRAVENOUS
  Filled 2023-01-16: qty 1

## 2023-01-16 MED ORDER — HYDROXYZINE HCL 25 MG PO TABS
25.0000 mg | ORAL_TABLET | Freq: Once | ORAL | Status: AC
  Administered 2023-01-16: 25 mg via ORAL
  Filled 2023-01-16: qty 1

## 2023-01-16 MED ORDER — HYDROCODONE-ACETAMINOPHEN 5-325 MG PO TABS
1.0000 | ORAL_TABLET | Freq: Once | ORAL | Status: AC
  Administered 2023-01-16: 1 via ORAL
  Filled 2023-01-16: qty 1

## 2023-01-16 MED ORDER — SODIUM CHLORIDE 0.9 % IV SOLN
1.0000 g | Freq: Once | INTRAVENOUS | Status: AC
  Administered 2023-01-16: 1 g via INTRAVENOUS
  Filled 2023-01-16: qty 10

## 2023-01-16 MED ORDER — ONDANSETRON 4 MG PO TBDP: 4.0000 mg | ORAL_TABLET | Freq: Three times a day (TID) | ORAL | 0 refills | Status: DC | PRN

## 2023-01-16 MED ORDER — CEPHALEXIN 500 MG PO CAPS: 500.0000 mg | ORAL_CAPSULE | Freq: Three times a day (TID) | ORAL | 0 refills | Status: AC

## 2023-01-16 MED ORDER — HALOPERIDOL LACTATE 5 MG/ML IJ SOLN
5.0000 mg | Freq: Once | INTRAMUSCULAR | Status: AC
  Administered 2023-01-16: 5 mg via INTRAVENOUS
  Filled 2023-01-16: qty 1

## 2023-01-16 MED ORDER — LACTATED RINGERS IV BOLUS
1000.0000 mL | Freq: Once | INTRAVENOUS | Status: AC
  Administered 2023-01-16: 1000 mL via INTRAVENOUS

## 2023-01-16 NOTE — Discharge Instructions (Addendum)
Your workup today was reassuring.  I have sent antibiotic and to treat the potential UTI.  We will send a urine culture to confirm.  Have also sent in nausea medication as well as Bentyl to use as needed for nausea and abdominal pain.  Return for any concerning symptoms.

## 2023-01-16 NOTE — ED Provider Notes (Signed)
[place] [facility] DEPARTMENT AT [facility] Provider Note   CSN: [id number] Arrival date & time: 01/16/23  1215     History  Chief Complaint  Patient presents with   Abdominal Pain    Debbie Santana is a 32 y.o. female.  HPI     32 year old female comes in with chief complaint of abdominal pain. Patient has history of esophagitis/gastritis, H. pylori infection, chronic pain and cyclic vomiting syndrome.  She reports that she started having generalized abdominal pain yesterday with associated nausea and vomiting.  She has taken home pain medication without significant relief.  She has also tried home nausea medication without relief.  She thinks that she had at least 5 episodes of emesis yesterday, maybe more.  There is no specific trigger to her symptoms.   Home Medications Prior to Admission medications   Medication Sig Start Date End Date Taking? Authorizing Provider  cephALEXin (KEFLEX) 500 MG capsule Take 1 capsule (500 mg total) by mouth 3 (three) times daily for 7 days. 01/16/23 01/23/23 Yes Ali, Amjad, PA-C  dicyclomine (BENTYL) 20 MG tablet Take 1 tablet (20 mg total) by mouth 2 (two) times daily. 01/16/23  Yes Ali, Amjad, PA-C  ondansetron (ZOFRAN-ODT) 4 MG disintegrating tablet Take 1 tablet (4 mg total) by mouth every 8 (eight) hours as needed for nausea or vomiting. 01/16/23  Yes Karie Mainland, Amjad, PA-C  albuterol (VENTOLIN HFA) 108 (90 Base) MCG/ACT inhaler Inhale 2 puffs into the lungs every 6 (six) hours as needed for wheezing or shortness of breath. Patient not taking: Reported on 12/24/2022 09/11/20   Catarina Hartshorn, MD  butalbital-acetaminophen-caffeine (FIORICET) 50-325-40 MG tablet TAKE 1 TABLET BY MOUTH EVERY 6 HOURS AS NEEDED FOR MIGRAINE HEADACHE Patient not taking: Reported on 12/24/2022 12/04/20   Adline Potter, NP  clonazePAM (KLONOPIN) 1 MG tablet Take 1 mg by mouth 4 (four) times daily as needed. 10/23/22   [provider]  diphenhydrAMINE  (BENADRYL) 25 MG tablet Take 25 mg by mouth every 6 (six) hours as needed for allergies.    [provider]  etonogestrel (NEXPLANON) 68 MG IMPL implant 68 mg by Subdermal route once.    [provider]  hyoscyamine (LEVSIN SL) 0.125 MG SL tablet Place 1 tablet (0.125 mg total) under the tongue every 6 (six) hours as needed. 05/07/22   Carlan, Chelsea L, NP  NURTEC 75 MG TBDP Take 75 mg by mouth as needed (migraine). Patient not taking: Reported on 12/24/2022 10/23/22   [provider]  oxyCODONE-acetaminophen (PERCOCET) 10-325 MG tablet Take 1 tablet by mouth See admin instructions. Take 1 tablet 1-4 times daily Patient not taking: Reported on 12/24/2022 06/05/22   [provider]  pantoprazole (PROTONIX) 40 MG tablet Take 1 tablet (40 mg total) by mouth daily. 12/19/22 01/18/23  Arrien, York Ram, MD  promethazine (PHENERGAN) 25 MG suppository Place 1 suppository (25 mg total) rectally every 6 (six) hours as needed for nausea or vomiting. 01/13/23   Triplett, Tammy, PA-C  propranolol (INDERAL) 10 MG tablet Take 1 tablet (10 mg total) by mouth 2 (two) times daily. 11/12/22   Ocie Doyne, MD  sertraline (ZOLOFT) 50 MG tablet Take 200 mg by mouth daily.    [provider]  SPIRIVA RESPIMAT 2.5 MCG/ACT AERS Inhale 2 each into the lungs daily. 09/24/22   [provider]  sucralfate (CARAFATE) 1 g tablet Take 1 tablet (1 g total) by mouth 3 (three) times daily before meals. 11/06/21  Carlan, Chelsea L, NP  sulfamethoxazole-trimethoprim (BACTRIM DS) 800-160 MG tablet Take 1 tablet by mouth 2 (two) times daily. X 7 days Patient not taking: Reported on 12/24/2022 12/09/22   Cheral Marker, CNM  tiZANidine (ZANAFLEX) 4 MG tablet Take 4 mg by mouth at bedtime. 10/23/22   [provider]  VRAYLAR 1.5 MG capsule Take 1.5 mg by mouth daily. 09/27/22   [provider]      Allergies    Tape, Buspirone, Fluoxetine, and Nsaids    Review of  Systems   Review of Systems  All other systems reviewed and are negative.   Physical Exam Updated Vital Signs BP 130/78   Pulse 100   Temp 98.1 F (36.7 C) (Oral)   Resp 16   Ht 5\' 8"  (1.727 m)   Wt 84.8 kg   LMP 12/30/2022   SpO2 98%   BMI 28.43 kg/m  Physical Exam Vitals and nursing note reviewed.  Constitutional:      Appearance: She is well-developed.  HENT:     Head: Atraumatic.  Cardiovascular:     Rate and Rhythm: Normal rate.  Pulmonary:     Effort: Pulmonary effort is normal.  Abdominal:     Tenderness: There is generalized abdominal tenderness. There is no guarding or rebound.  Musculoskeletal:     Cervical back: Normal range of motion and neck supple.  Skin:    General: Skin is warm and dry.  Neurological:     Mental Status: She is alert and oriented to person, place, and time.     ED Results / Procedures / Treatments   Labs (all labs ordered are listed, but only abnormal results are displayed) Labs Reviewed  COMPREHENSIVE METABOLIC PANEL - Abnormal; Notable for the following components:      Result Value   Potassium 3.3 (*)    Glucose, Bld 117 (*)    Total Bilirubin 0.1 (*)    All other components within normal limits  CBC - Abnormal; Notable for the following components:   WBC 14.7 (*)    All other components within normal limits  URINALYSIS, ROUTINE W REFLEX MICROSCOPIC - Abnormal; Notable for the following components:   APPearance HAZY (*)    Leukocytes,Ua MODERATE (*)    Bacteria, UA RARE (*)    All other components within normal limits  LIPASE, BLOOD  POC URINE PREG, ED    EKG None  Radiology No results found.  Procedures Procedures    Medications Ordered in ED Medications  fentaNYL (SUBLIMAZE) injection 50 mcg (50 mcg Intravenous Given 01/16/23 1301)  pantoprazole (PROTONIX) injection 40 mg (40 mg Intravenous Given 01/16/23 1301)  haloperidol lactate (HALDOL) injection 5 mg (5 mg Intravenous Given 01/16/23 1301)  lactated  ringers bolus 1,000 mL (0 mLs Intravenous Stopped 01/16/23 1402)  HYDROcodone-acetaminophen (NORCO/VICODIN) 5-325 MG per tablet 1 tablet (1 tablet Oral Given 01/16/23 1650)  ondansetron (ZOFRAN) injection 4 mg (4 mg Intravenous Given 01/16/23 1650)  alum & mag hydroxide-simeth (MAALOX/MYLANTA) 200-200-20 MG/5ML suspension 30 mL (30 mLs Oral Given 01/16/23 1650)  HYDROcodone-acetaminophen (NORCO/VICODIN) 5-325 MG per tablet 1 tablet (1 tablet Oral Given 01/16/23 1921)  ondansetron (ZOFRAN) injection 4 mg (4 mg Intravenous Given 01/16/23 1924)  cefTRIAXone (ROCEPHIN) 1 g in sodium chloride 0.9 % 100 mL IVPB (0 g Intravenous Stopped 01/16/23 1950)  hydrOXYzine (ATARAX) tablet 25 mg (25 mg Oral Given 01/16/23 1921)    ED Course/ Medical Decision Making/ A&P  Medical Decision Making Amount and/or Complexity of Data Reviewed Labs: ordered.  Risk OTC drugs. Prescription drug management.   32 year old female comes in with chief complaint of abdominal pain, nausea and vomiting.  She has a pertinent history of esophagitis, H. pylori infection, previous history of cyclic vomiting as well for which she required admission to the hospital.  Differential diagnosis for her includes cyclic vomiting syndrome, PUD, severe dehydration, electrolyte abnormality, AKI.  Plan is to focus on symptom management at this time and give patient IV fluids.  Abdominal exam is overall reassuring.  I do not think a CT scan is needed.  History provided by patient and also I have reviewed patient's chart, reviewed her previous endoscopies to refine our decision making for her.  Also reviewed previous discharge summary.  Final Clinical Impression(s) / ED Diagnoses Final diagnoses:  Generalized abdominal pain  Urinary tract infection with hematuria, site unspecified    Rx / DC Orders ED Discharge Orders          Ordered    ondansetron (ZOFRAN-ODT) 4 MG disintegrating tablet  Every 8 hours  PRN        01/16/23 1917    dicyclomine (BENTYL) 20 MG tablet  2 times daily        01/16/23 1917    cephALEXin (KEFLEX) 500 MG capsule  3 times daily        01/16/23 1917              Derwood Kaplan, MD 01/17/23 1010

## 2023-01-16 NOTE — ED Notes (Signed)
Pt ate half graham crackers and drank whole gingerale

## 2023-01-16 NOTE — ED Triage Notes (Signed)
Pt came to ED via [facility] for abd pain that started yesterday. Pt has a hx of stomach ulcers. [facility] gave 4 mg of zofran and  of morphine. 20G in RAC.

## 2023-01-16 NOTE — ED Provider Notes (Signed)
Signout received on this 32 year old female.  At the time of signout she is awaiting p.o. challenge.  She has had recent visits for similar complaints.  Workup was reassuring at that time.  Workup reassuring today.  Please see note from signout provider regarding full details.  She does have some findings suggesting UTI on UA.  Will send urine culture.  Will treat.  Physical Exam  BP 117/71   Pulse (!) 56   Temp 97.7 F (36.5 C) (Oral)   Resp 17   Ht 5\' 8"  (1.727 m)   Wt 84.8 kg   LMP 12/30/2022   SpO2 93%   BMI 28.43 kg/m     Procedures  Procedures  ED Course / MDM    Medical Decision Making Amount and/or Complexity of Data Reviewed Labs: ordered.  Risk OTC drugs. Prescription drug management.   She is tolerating p.o. intake during my interview.  She is in agreement with treatment for UTI, symptomatic management.  She is requesting 1 last dose of medicines prior to discharge.  Return precautions discussed.  Patient voices understanding and is in agreement with plan.       Marita Kansas, PA-C 01/16/23 1918    Derwood Kaplan, MD 01/17/23 [phone]

## 2023-01-16 NOTE — ED Notes (Signed)
Ginger ale and crackers given to pt.

## 2023-01-21 ENCOUNTER — Other Ambulatory Visit: Payer: Self-pay

## 2023-01-21 ENCOUNTER — Emergency Department (HOSPITAL_COMMUNITY)
Admission: EM | Admit: 2023-01-21 | Discharge: 2023-01-21 | Disposition: A | Discharge status: Home / Self Care | Payer: Medicaid Other | Attending: Emergency Medicine | Admitting: Emergency Medicine

## 2023-01-21 ENCOUNTER — Encounter (HOSPITAL_COMMUNITY): Payer: Self-pay | Admitting: *Deleted

## 2023-01-21 DIAGNOSIS — M549 Dorsalgia, unspecified: Secondary | ICD-10-CM | POA: Diagnosis not present

## 2023-01-21 DIAGNOSIS — R1084 Generalized abdominal pain: Secondary | ICD-10-CM | POA: Diagnosis not present

## 2023-01-21 DIAGNOSIS — Z79899 Other long term (current) drug therapy: Secondary | ICD-10-CM | POA: Diagnosis not present

## 2023-01-21 DIAGNOSIS — R112 Nausea with vomiting, unspecified: Secondary | ICD-10-CM

## 2023-01-21 DIAGNOSIS — R1013 Epigastric pain: Secondary | ICD-10-CM | POA: Diagnosis present

## 2023-01-21 DIAGNOSIS — F12188 Cannabis abuse with other cannabis-induced disorder: Secondary | ICD-10-CM

## 2023-01-21 DIAGNOSIS — F17210 Nicotine dependence, cigarettes, uncomplicated: Secondary | ICD-10-CM | POA: Diagnosis not present

## 2023-01-21 DIAGNOSIS — R1111 Vomiting without nausea: Secondary | ICD-10-CM | POA: Diagnosis not present

## 2023-01-21 DIAGNOSIS — R109 Unspecified abdominal pain: Secondary | ICD-10-CM | POA: Diagnosis not present

## 2023-01-21 DIAGNOSIS — I10 Essential (primary) hypertension: Secondary | ICD-10-CM | POA: Insufficient documentation

## 2023-01-21 LAB — CBC WITH DIFFERENTIAL/PLATELET
Abs Immature Granulocytes: 0.06 10*3/uL (ref 0.00–0.07)
Basophils Absolute: 0 10*3/uL (ref 0.0–0.1)
Basophils Relative: 0 %
Eosinophils Absolute: 0 10*3/uL (ref 0.0–0.5)
Eosinophils Relative: 0 %
HCT: 42.3 % (ref 36.0–46.0)
Hemoglobin: 14.8 g/dL (ref 12.0–15.0)
Immature Granulocytes: 0 %
Lymphocytes Relative: 6 %
Lymphs Abs: 0.9 10*3/uL (ref 0.7–4.0)
MCH: 32.7 pg (ref 26.0–34.0)
MCHC: 35 g/dL (ref 30.0–36.0)
MCV: 93.6 fL (ref 80.0–100.0)
Monocytes Absolute: 0.3 10*3/uL (ref 0.1–1.0)
Monocytes Relative: 2 %
Neutro Abs: 13.8 10*3/uL — ABNORMAL HIGH (ref 1.7–7.7)
Neutrophils Relative %: 92 %
Platelets: 224 10*3/uL (ref 150–400)
RBC: 4.52 MIL/uL (ref 3.87–5.11)
RDW: 12.5 % (ref 11.5–15.5)
WBC: 15.1 10*3/uL — ABNORMAL HIGH (ref 4.0–10.5)
nRBC: 0 % (ref 0.0–0.2)

## 2023-01-21 LAB — COMPREHENSIVE METABOLIC PANEL
ALT: 20 U/L (ref 0–44)
AST: 17 U/L (ref 15–41)
Albumin: 4.1 g/dL (ref 3.5–5.0)
Alkaline Phosphatase: 57 U/L (ref 38–126)
Anion gap: 9 (ref 5–15)
BUN: 7 mg/dL (ref 6–20)
CO2: 22 mmol/L (ref 22–32)
Calcium: 9 mg/dL (ref 8.9–10.3)
Chloride: 102 mmol/L (ref 98–111)
Creatinine, Ser: 0.63 mg/dL (ref 0.44–1.00)
GFR, Estimated: >60 mL/min (ref >60)
Glucose, Bld: 136 mg/dL — ABNORMAL HIGH (ref 70–99)
Potassium: 3.6 mmol/L (ref 3.5–5.1)
Sodium: 133 mmol/L — ABNORMAL LOW (ref 135–145)
Total Bilirubin: 0.7 mg/dL (ref 0.3–1.2)
Total Protein: 7.2 g/dL (ref 6.5–8.1)

## 2023-01-21 LAB — LIPASE, BLOOD: Lipase: 24 U/L (ref 11–51)

## 2023-01-21 MED ORDER — PROMETHAZINE HCL 25 MG RE SUPP: 25.0000 mg | Freq: Four times a day (QID) | RECTAL | 0 refills | Status: DC | PRN

## 2023-01-21 MED ORDER — ONDANSETRON 4 MG PO TBDP: 4.0000 mg | ORAL_TABLET | Freq: Three times a day (TID) | ORAL | 0 refills | Status: DC | PRN

## 2023-01-21 MED ORDER — HALOPERIDOL LACTATE 5 MG/ML IJ SOLN
5.0000 mg | Freq: Once | INTRAMUSCULAR | Status: AC
  Administered 2023-01-21: 5 mg via INTRAVENOUS
  Filled 2023-01-21: qty 1

## 2023-01-21 MED ORDER — PROMETHAZINE HCL 25 MG/ML IJ SOLN
25.0000 mg | Freq: Once | INTRAMUSCULAR | Status: AC
  Administered 2023-01-21: 25 mg via INTRAMUSCULAR
  Filled 2023-01-21: qty 1

## 2023-01-21 MED ORDER — SODIUM CHLORIDE 0.9 % IV BOLUS
1000.0000 mL | Freq: Once | INTRAVENOUS | Status: AC
  Administered 2023-01-21: 1000 mL via INTRAVENOUS

## 2023-01-21 NOTE — ED Provider Notes (Signed)
[place] EMERGENCY DEPARTMENT AT [facility] Provider Note   CSN: [id number] Arrival date & time: 01/21/23  1420     History  Chief Complaint  Patient presents with   Abdominal Pain    Debbie Santana is a 32 y.o. female.   Abdominal Pain    This patient is a 32 year old female with a history of multiple medical problems including substance abuse, history of what she describes as peptic ulcer disease, anxiety and depression, she had had an upper endoscopy in March 2023 about 13 to 14 months ago, the reason was for epigastric abdominal pain.  She had a normal esophagus, there were a few erosions with no history of recent bleeding found in the gastric antrum, the rest of the stomach was normal, the duodenum was normal.  Home Medications Prior to Admission medications   Medication Sig Start Date End Date Taking? Authorizing Provider  ondansetron (ZOFRAN-ODT) 4 MG disintegrating tablet Take 1 tablet (4 mg total) by mouth every 8 (eight) hours as needed for nausea. 01/21/23  Yes Eber Hong, MD  promethazine (PHENERGAN) 25 MG suppository Place 1 suppository (25 mg total) rectally every 6 (six) hours as needed for nausea or vomiting. 01/21/23  Yes Eber Hong, MD  albuterol (VENTOLIN HFA) 108 (90 Base) MCG/ACT inhaler Inhale 2 puffs into the lungs every 6 (six) hours as needed for wheezing or shortness of breath. Patient not taking: Reported on 12/24/2022 09/11/20   Catarina Hartshorn, MD  butalbital-acetaminophen-caffeine (FIORICET) [phone] MG tablet TAKE 1 TABLET BY MOUTH EVERY 6 HOURS AS NEEDED FOR MIGRAINE HEADACHE Patient not taking: Reported on 12/24/2022 12/04/20   Adline Potter, NP  cephALEXin (KEFLEX) 500 MG capsule Take 1 capsule (500 mg total) by mouth 3 (three) times daily for 7 days. 01/16/23 01/23/23  Marita Kansas, PA-C  clonazePAM (KLONOPIN) 1 MG tablet Take 1 mg by mouth 4 (four) times daily as needed. 10/23/22   [provider]  dicyclomine (BENTYL) 20 MG tablet  Take 1 tablet (20 mg total) by mouth 2 (two) times daily. 01/16/23   Marita Kansas, PA-C  diphenhydrAMINE (BENADRYL) 25 MG tablet Take 25 mg by mouth every 6 (six) hours as needed for allergies.    [provider]  etonogestrel (NEXPLANON) 68 MG IMPL implant 68 mg by Subdermal route once.    [provider]  hyoscyamine (LEVSIN SL) 0.125 MG SL tablet Place 1 tablet (0.125 mg total) under the tongue every 6 (six) hours as needed. 05/07/22   Carlan, Chelsea L, NP  NURTEC 75 MG TBDP Take 75 mg by mouth as needed (migraine). Patient not taking: Reported on 12/24/2022 10/23/22   [provider]  oxyCODONE-acetaminophen (PERCOCET) 10-325 MG tablet Take 1 tablet by mouth See admin instructions. Take 1 tablet 1-4 times daily Patient not taking: Reported on 12/24/2022 06/05/22   [provider]  propranolol (INDERAL) 10 MG tablet Take 1 tablet (10 mg total) by mouth 2 (two) times daily. 11/12/22   Ocie Doyne, MD  sertraline (ZOLOFT) 50 MG tablet Take 200 mg by mouth daily.    [provider]  SPIRIVA RESPIMAT 2.5 MCG/ACT AERS Inhale 2 each into the lungs daily. 09/24/22   [provider]  sucralfate (CARAFATE) 1 g tablet Take 1 tablet (1 g total) by mouth 3 (three) times daily before meals. 11/06/21   Carlan, Chelsea L, NP  sulfamethoxazole-trimethoprim (BACTRIM DS) 800-160 MG tablet Take 1 tablet by mouth 2 (two) times daily. X 7 days Patient not  taking: Reported on 12/24/2022 12/09/22   Cheral Marker, CNM  tiZANidine (ZANAFLEX) 4 MG tablet Take 4 mg by mouth at bedtime. 10/23/22   [provider]  VRAYLAR 1.5 MG capsule Take 1.5 mg by mouth daily. 09/27/22   [provider]      Allergies    Tape, Buspirone, Fluoxetine, and Nsaids    Review of Systems   Review of Systems  Gastrointestinal:  Positive for abdominal pain.  All other systems reviewed and are negative.   Physical Exam Updated Vital Signs BP (!) 168/95 (BP Location: Right  Arm)   Pulse (!) 57   Temp 97.8 F (36.6 C) (Oral)   Resp 18   Ht 1.727 m (5\' 8" )   Wt 82.6 kg   LMP 12/30/2022   SpO2 98%   BMI 27.67 kg/m  Physical Exam Vitals and nursing note reviewed.  Constitutional:      General: She is not in acute distress.    Appearance: She is well-developed.  HENT:     Head: Normocephalic and atraumatic.     Mouth/Throat:     Pharynx: No oropharyngeal exudate.  Eyes:     General: No scleral icterus.       Right eye: No discharge.        Left eye: No discharge.     Conjunctiva/sclera: Conjunctivae normal.     Pupils: Pupils are equal, round, and reactive to light.  Neck:     Thyroid: No thyromegaly.     Vascular: No JVD.  Cardiovascular:     Rate and Rhythm: Normal rate and regular rhythm.     Heart sounds: Normal heart sounds. No murmur heard.    No friction rub. No gallop.  Pulmonary:     Effort: Pulmonary effort is normal. No respiratory distress.     Breath sounds: Normal breath sounds. No wheezing or rales.  Abdominal:     General: Bowel sounds are normal. There is no distension.     Palpations: Abdomen is soft. There is no mass.     Tenderness: There is abdominal tenderness.     Comments: Minimal tenderness epigastrium  Musculoskeletal:        General: No tenderness. Normal range of motion.     Cervical back: Normal range of motion and neck supple.  Lymphadenopathy:     Cervical: No cervical adenopathy.  Skin:    General: Skin is warm and dry.     Findings: No erythema or rash.  Neurological:     Mental Status: She is alert.     Coordination: Coordination normal.  Psychiatric:        Behavior: Behavior normal.     ED Results / Procedures / Treatments   Labs (all labs ordered are listed, but only abnormal results are displayed) Labs Reviewed  CBC WITH DIFFERENTIAL/PLATELET - Abnormal; Notable for the following components:      Result Value   WBC 15.1 (*)    Neutro Abs 13.8 (*)    All other components within normal  limits  COMPREHENSIVE METABOLIC PANEL - Abnormal; Notable for the following components:   Sodium 133 (*)    Glucose, Bld 136 (*)    All other components within normal limits  LIPASE, BLOOD  PREGNANCY, URINE    EKG None  Radiology No results found.  Procedures Procedures    Medications Ordered in ED Medications  haloperidol lactate (HALDOL) injection 5 mg (5 mg Intravenous Given 01/21/23 1631)  sodium chloride 0.9 %  bolus 1,000 mL (1,000 mLs Intravenous New Bag/Given 01/21/23 1617)  promethazine (PHENERGAN) injection 25 mg (25 mg Intramuscular Given 01/21/23 1633)    ED Course/ Medical Decision Making/ A&P                             Medical Decision Making Amount and/or Complexity of Data Reviewed Labs: ordered.  Risk Prescription drug management.    This patient presents to the ED for concern of nausea and vomiting with epigastric pain, of note the patient has already been to an outside hospital today and had a full evaluation including labs and urinalysis.  Review of the medical record shows that this patient has had multiple visits to the ER in the last week for abdominal pain and has been seen at multiple different hospitals and emergency departments over time for the same.  She has had an endoscopy within the last year confirming that she did not have any severe gastric ulcers, she states that she takes a proton pump inhibitor as well as Carafate as needed.  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  The pertinent results include: White blood cell count is down significantly from where it was at the outside ER earlier today.  Metabolic panel unremarkable   Imaging Studies ordered:  I ordered imaging studies including acute  abdominal series from outside hospital reviewed and normal  Medicines ordered and prescription drug management:  I ordered medication including haldol /   for nausea, also gave IV fluids Reevaluation of the patient after these medicines showed that the patient improved I have reviewed the patients home medicines and have made adjustments as needed   Problem List / ED Course:  I reviewed with the patient that she likely has some element of cannabis hyperemesis syndrome, I doubt that this is peptic ulcer disease or any other intra-abdominal emergency.  Her vital signs are normal, mild hypertension only.  She states that she think she needs to be admitted because of nausea and vomiting as she has not vomited here.  She will be given medications for home including rectal Phenergan. The patient is hemodynamically stable and has no surgical findings   Social Determinants of Health:    I have discussed with the patient at the bedside the results, and the meaning of these results.  They have expressed her understanding to the need for follow-up with primary care physician         Final Clinical Impression(s) / ED Diagnoses Final diagnoses:  Nausea and vomiting, unspecified vomiting type  Cannabis hyperemesis syndrome concurrent with and due to cannabis abuse (HCC)    Rx / DC Orders ED Discharge Orders          Ordered    promethazine (PHENERGAN) 25 MG suppository  Every 6 hours PRN        01/21/23 1725    ondansetron (ZOFRAN-ODT) 4 MG disintegrating tablet  Every 8 hours PRN        01/21/23 1725              Eber Hong, MD 01/21/23 1725

## 2023-01-21 NOTE — Discharge Instructions (Addendum)
You may use the Phenergan suppositories every 6 hours as needed for vomiting that is not getting any better with Zofran.  I have also prescribed Zofran, please stop smoking marijuana as this is likely the cause of your symptoms   Thank you for allowing [place] to treat you in the emergency department today.  After reviewing your examination and potential testing that was done it appears that you are safe to go home.  I would like for you to follow-up with your doctor within the next several days, have them obtain your results and follow-up with them to review all of these tests.  If you should develop severe or worsening symptoms return to the emergency department immediately

## 2023-01-21 NOTE — ED Triage Notes (Signed)
Pt in c/o mid epigastric pain onset since 01/16/23, pt reports x 6-7 emesis today, denies diarrhea, pt hx of ulcers, pt reports taking Bentyl, Protonix, and Carafate without relief, A&O X4

## 2023-01-23 ENCOUNTER — Other Ambulatory Visit: Payer: Self-pay

## 2023-01-23 ENCOUNTER — Encounter (HOSPITAL_COMMUNITY): Payer: Self-pay | Admitting: Emergency Medicine

## 2023-01-23 ENCOUNTER — Emergency Department (HOSPITAL_COMMUNITY)
Admission: EM | Admit: 2023-01-23 | Discharge: 2023-01-23 | Disposition: A | Discharge status: Home / Self Care | Payer: Medicaid Other | Attending: Student | Admitting: Student

## 2023-01-23 DIAGNOSIS — F12188 Cannabis abuse with other cannabis-induced disorder: Secondary | ICD-10-CM | POA: Diagnosis not present

## 2023-01-23 DIAGNOSIS — R112 Nausea with vomiting, unspecified: Secondary | ICD-10-CM | POA: Diagnosis present

## 2023-01-23 DIAGNOSIS — F17210 Nicotine dependence, cigarettes, uncomplicated: Secondary | ICD-10-CM | POA: Insufficient documentation

## 2023-01-23 DIAGNOSIS — I10 Essential (primary) hypertension: Secondary | ICD-10-CM | POA: Diagnosis not present

## 2023-01-23 DIAGNOSIS — R1084 Generalized abdominal pain: Secondary | ICD-10-CM | POA: Diagnosis not present

## 2023-01-23 DIAGNOSIS — R1115 Cyclical vomiting syndrome unrelated to migraine: Secondary | ICD-10-CM | POA: Insufficient documentation

## 2023-01-23 DIAGNOSIS — R000 Tachycardia, unspecified: Secondary | ICD-10-CM | POA: Diagnosis not present

## 2023-01-23 LAB — COMPREHENSIVE METABOLIC PANEL
ALT: 21 U/L (ref 0–44)
AST: 16 U/L (ref 15–41)
Albumin: 4.3 g/dL (ref 3.5–5.0)
Alkaline Phosphatase: 58 U/L (ref 38–126)
Anion gap: 10 (ref 5–15)
BUN: 8 mg/dL (ref 6–20)
CO2: 23 mmol/L (ref 22–32)
Calcium: 9.5 mg/dL (ref 8.9–10.3)
Chloride: 102 mmol/L (ref 98–111)
Creatinine, Ser: 0.65 mg/dL (ref 0.44–1.00)
GFR, Estimated: >60 mL/min (ref >60)
Glucose, Bld: 125 mg/dL — ABNORMAL HIGH (ref 70–99)
Potassium: 3.4 mmol/L — ABNORMAL LOW (ref 3.5–5.1)
Sodium: 135 mmol/L (ref 135–145)
Total Bilirubin: 0.8 mg/dL (ref 0.3–1.2)
Total Protein: 7.2 g/dL (ref 6.5–8.1)

## 2023-01-23 LAB — CBC WITH DIFFERENTIAL/PLATELET
Abs Immature Granulocytes: 0.08 10*3/uL — ABNORMAL HIGH (ref 0.00–0.07)
Basophils Absolute: 0 10*3/uL (ref 0.0–0.1)
Basophils Relative: 0 %
Eosinophils Absolute: 0 10*3/uL (ref 0.0–0.5)
Eosinophils Relative: 0 %
HCT: 43.7 % (ref 36.0–46.0)
Hemoglobin: 15.5 g/dL — ABNORMAL HIGH (ref 12.0–15.0)
Immature Granulocytes: 1 %
Lymphocytes Relative: 13 %
Lymphs Abs: 2 10*3/uL (ref 0.7–4.0)
MCH: 32.7 pg (ref 26.0–34.0)
MCHC: 35.5 g/dL (ref 30.0–36.0)
MCV: 92.2 fL (ref 80.0–100.0)
Monocytes Absolute: 0.8 10*3/uL (ref 0.1–1.0)
Monocytes Relative: 5 %
Neutro Abs: 12.9 10*3/uL — ABNORMAL HIGH (ref 1.7–7.7)
Neutrophils Relative %: 81 %
Platelets: 252 10*3/uL (ref 150–400)
RBC: 4.74 MIL/uL (ref 3.87–5.11)
RDW: 12.4 % (ref 11.5–15.5)
WBC: 15.8 10*3/uL — ABNORMAL HIGH (ref 4.0–10.5)
nRBC: 0 % (ref 0.0–0.2)

## 2023-01-23 LAB — LIPASE, BLOOD: Lipase: 25 U/L (ref 11–51)

## 2023-01-23 MED ORDER — DROPERIDOL 2.5 MG/ML IJ SOLN
1.2500 mg | Freq: Once | INTRAMUSCULAR | Status: AC
  Administered 2023-01-23: 1.25 mg via INTRAVENOUS
  Filled 2023-01-23: qty 2

## 2023-01-23 MED ORDER — LORAZEPAM 2 MG/ML IJ SOLN
0.5000 mg | Freq: Once | INTRAMUSCULAR | Status: AC
  Administered 2023-01-23: 0.5 mg via INTRAVENOUS
  Filled 2023-01-23: qty 1

## 2023-01-23 MED ORDER — SUCRALFATE 1 GM/10ML PO SUSP
1.0000 g | Freq: Once | ORAL | Status: AC
  Administered 2023-01-23: 1 g via ORAL
  Filled 2023-01-23: qty 10

## 2023-01-23 NOTE — ED Notes (Signed)
No urine at this time 

## 2023-01-23 NOTE — ED Notes (Signed)
Pt verbalized understanding of discharge paperwork and follow-up care.  °

## 2023-01-23 NOTE — ED Notes (Signed)
Pt requesting Carafate liquid. EDP made aware

## 2023-01-23 NOTE — ED Provider Notes (Signed)
[facility] AT [facility] Provider Note  CSN: [id number] Arrival date & time: 01/23/23 4098  Chief Complaint(s) Abdominal Pain  HPI Debbie Santana is a 32 y.o. female with PMH polysubstance abuse, cyclic vomiting syndrome, cannabinoid hyperemesis syndrome, reported peptic ulcer disease with EGD showing mild erosive gastritis but no true peptic ulcers who presents emergency room for evaluation of intractable abdominal pain nausea vomiting.  States the pain is primarily in the epigastrium.  Patient has been seen 6 times in emergency departments since 12/16/2022 for the same complaints.  Most recent CT abdomen pelvis obtained on 01/13/2023 that was reassuringly unremarkable.  Patient then seen 3 times more since then including a presentation on 01/21/2023 where review of her opioid prescriptions highlights concerns for possible opioid withdrawal.  Patient was discharged from that outside hospital and on the same day return to [facility] where her symptoms were completely resolved with Haldol.  Here in the emergency room, patient is rolling around in the bed clutching her epigastrium with concern for intractable nausea and vomiting.  Patient presentation identical to previous ER presentations with negative workup.  Denies chest pain, shortness of breath, headache, fever or other systemic symptoms.  Denies alcohol use.   Past Medical History Past Medical History:  Diagnosis Date   Anxiety    Bradycardia 04/17/2017   Chest pain on breathing 02/22/2020   Chronic back pain    Depression    Erosive gastritis 04/11/2017   Gastric ulcer    Genital warts    GERD (gastroesophageal reflux disease)    High cholesterol    History of gestational hypertension 05/21/2018   Dx intrapartum   History of kidney stones    Intertrigo 11/16/2019   Intractable abdominal pain 04/11/2017   Intractable nausea and vomiting 12/07/2018   Intractable vomiting 05/20/2020   Irregular  intermenstrual bleeding 02/19/2019   Mental disorder    Migraine without aura and without status migrainosus, not intractable 02/19/2019   Nausea 05/10/2020   Nausea & vomiting 12/07/2018   Nexplanon in place 02/19/2019   Obesity 04/17/2017   Panic disorder    Peptic ulcer disease 04/13/2017   Pinched nerve 09/24/2019   Postpartum hypertension 06/23/2018   requiring norvasc, resolved at pp visit   Sciatica    Tremor of both hands    Vaginal Pap smear, abnormal    Wheezing    Patient Active Problem List   Diagnosis Date Noted   Substance abuse (HCC) 12/17/2022   Bartholin's gland cyst 12/17/2022   Vaginal discharge 10/24/2022   Dysmenorrhea 10/24/2022   Dyspareunia, female 10/24/2022   Pelvic pain 10/24/2022   Encounter for gynecological examination with Papanicolaou smear of cervix 06/13/2022   Helicobacter positive gastritis 05/07/2022   Erosive gastropathy 05/07/2022   Boil 04/12/2022   Positive H. 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) 08/23/2020   Post laminectomy syndrome 08/23/2020   Frequent headaches 08/10/2020   Nexplanon in place 08/10/2020   Irregular intermenstrual bleeding  08/10/2020   Obesity (BMI 30-39.9) 05/20/2020   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   Panic attack 05/18/2020   Abdominal pain, chronic, epigastric 05/18/2020   Agitated 05/18/2020   Gastroesophageal reflux disease 05/10/2020    Alternating constipation and diarrhea 05/10/2020   Bilateral wheezing 02/22/2020   Sleep disturbance 02/22/2020   Anxiety and depression 02/19/2019   Intractable nausea and vomiting 12/07/2018   Low back pain 12/07/2018   Chronic diarrhea 12/07/2018   Nausea & vomiting 12/07/2018   Condyloma 04/15/2018   History of abnormal cervical Pap smear 11/04/2017   Reflux esophagitis 04/17/2017   Tobacco abuse 04/17/2017   Gastro-esophageal reflux disease with esophagitis 04/17/2017   Peptic ulcer disease 04/13/2017   Erosive gastritis 04/11/2017   Leukocytosis 04/11/2017   Anxiety 04/11/2017   Home Medication(s) Prior to Admission medications   Medication Sig Start Date End Date Taking? Authorizing Provider  butalbital-acetaminophen-caffeine (FIORICET) 50-325-40 MG tablet TAKE 1 TABLET BY MOUTH EVERY 6 HOURS AS NEEDED FOR MIGRAINE HEADACHE 12/04/20  Yes Adline Potter, NP  naloxone [facility]) 2 MG/2ML injection Spray one-half (1 ml) of syringe into each nostril upon signs of opioid overdose. Call 911. May repeat in 3-5 minutes as needed, as directed 01/21/23  Yes [provider]  oxyCODONE-acetaminophen (PERCOCET) 10-325 MG tablet Take 1 tablet by mouth See admin instructions. Take 1 tablet 1-4 times daily 06/05/22  Yes [provider]  sertraline (ZOLOFT) 50 MG tablet Take 200 mg by mouth daily.   Yes [provider]  traZODone (DESYREL) 50 MG tablet Take 100 mg by mouth at bedtime as needed. 01/21/23  Yes [provider]  albuterol (VENTOLIN HFA) 108 (90 Base) MCG/ACT inhaler Inhale 2 puffs into the lungs every 6 (six) hours as needed for wheezing or shortness of breath. 09/11/20   Catarina Hartshorn, MD  cephALEXin (KEFLEX) 500 MG capsule Take 1 capsule (500 mg total) by mouth 3 (three) times daily for 7 days. 01/16/23 01/23/23  Marita Kansas, PA-C  clonazePAM (KLONOPIN) 1 MG tablet Take 1 mg by mouth 4 (four) times daily as needed. 10/23/22   [provider]   dicyclomine (BENTYL) 20 MG tablet Take 1 tablet (20 mg total) by mouth 2 (two) times daily. 01/16/23   Marita Kansas, PA-C  diphenhydrAMINE (BENADRYL) 25 MG tablet Take 25 mg by mouth every 6 (six) hours as needed for allergies.    [provider]  etonogestrel (NEXPLANON) 68 MG IMPL implant 68 mg by Subdermal route once.    [provider]  hyoscyamine (LEVSIN SL) 0.125 MG SL tablet Place 1 tablet (0.125 mg total) under the tongue every 6 (six) hours as needed. 05/07/22   Carlan, Chelsea L, NP  NURTEC 75 MG TBDP Take 75 mg by mouth as needed (migraine). Patient not taking: Reported on 12/24/2022 10/23/22   [provider]  ondansetron (ZOFRAN-ODT) 4 MG disintegrating tablet Take 1 tablet (4 mg total) by mouth every 8 (eight) hours as needed for nausea. 01/21/23   Eber Hong, MD  promethazine (PHENERGAN) 25 MG suppository Place 1 suppository (25 mg total) rectally every 6 (six) hours as needed for nausea or vomiting. 01/21/23   Eber Hong, MD  propranolol (INDERAL) 10 MG tablet Take 1 tablet (10 mg total) by mouth 2 (two) times daily. 11/12/22   Ocie Doyne, MD  SPIRIVA RESPIMAT 2.5 MCG/ACT AERS Inhale 2 each into the lungs daily. 09/24/22   [provider]  sucralfate (CARAFATE) 1 g tablet Take 1 tablet (1 g  total) by mouth 3 (three) times daily before meals. 11/06/21   Carlan, Chelsea L, NP  sulfamethoxazole-trimethoprim (BACTRIM DS) 800-160 MG tablet Take 1 tablet by mouth 2 (two) times daily. X 7 days Patient not taking: Reported on 12/24/2022 12/09/22   Cheral Marker, CNM  tiZANidine (ZANAFLEX) 4 MG tablet Take 4 mg by mouth at bedtime. 10/23/22   [provider]  VRAYLAR 1.5 MG capsule Take 1.5 mg by mouth daily. 09/27/22   [provider]                                                                                                                                    Past Surgical History Past Surgical History:  Procedure Laterality Date    BACK SURGERY     ruptured disc.   BACK SURGERY  11/2021   BIOPSY  04/14/2017   Procedure: BIOPSY;  Surgeon: Malissa Hippo, MD;  [facility];  Service: Endoscopy;;  gastric   BIOPSY  05/22/2020   Procedure: BIOPSY;  Surgeon: Corbin Ade, MD;  [facility];  Service: Endoscopy;;   BIOPSY  08/29/2020   Procedure: BIOPSY;  Surgeon: Dolores Frame, MD;  [facility];  Service: Gastroenterology;;   BIOPSY  09/09/2020   Procedure: BIOPSY;  Surgeon: Lanelle Bal, DO;  [facility]: [facility];  Service: Endoscopy;;  duodenum;antral   BIOPSY  11/21/2021   Procedure: BIOPSY;  Surgeon: Malissa Hippo, MD;  [facility];  Service: Endoscopy;;   CERVICAL ABLATION N/A 12/02/2018   Procedure: LASER ABLATION OF CERVIX;  Surgeon: Lazaro Arms, MD;  Location: [facility];  Service: Gynecology;  Laterality: N/A;   CHOLECYSTECTOMY N/A 04/17/2017   Procedure: LAPAROSCOPIC CHOLECYSTECTOMY;  Surgeon: Franky Macho, MD;  Location: [facility];  Service: General;  Laterality: N/A;   COLONOSCOPY WITH PROPOFOL N/A 08/29/2020   Procedure: COLONOSCOPY WITH PROPOFOL;  Surgeon: Dolores Frame, MD;  [facility];  Service: Gastroenterology;  Laterality: N/A;  11:15   COLPOSCOPY     ESOPHAGOGASTRODUODENOSCOPY (EGD) WITH PROPOFOL N/A 04/14/2017   Procedure: ESOPHAGOGASTRODUODENOSCOPY (EGD) WITH PROPOFOL;  Surgeon: Malissa Hippo, MD;  [facility];  Service: Endoscopy;  Laterality: N/A;   ESOPHAGOGASTRODUODENOSCOPY (EGD) WITH PROPOFOL N/A 05/22/2020   Procedure: ESOPHAGOGASTRODUODENOSCOPY (EGD) WITH PROPOFOL;  Surgeon: Corbin Ade, MD;  [facility];  Service: Endoscopy;  Laterality: N/A;   ESOPHAGOGASTRODUODENOSCOPY (EGD) WITH PROPOFOL N/A 09/09/2020   Procedure: ESOPHAGOGASTRODUODENOSCOPY (EGD) WITH PROPOFOL;  Surgeon: Lanelle Bal, DO;  [facility];  Service: Endoscopy;  Laterality: N/A;    ESOPHAGOGASTRODUODENOSCOPY (EGD) WITH PROPOFOL N/A 11/21/2020   Procedure: ESOPHAGOGASTRODUODENOSCOPY (EGD) WITH PROPOFOL;  Surgeon: Dolores Frame, MD;  [facility];  Service: Gastroenterology;  Laterality: N/A;  Am   ESOPHAGOGASTRODUODENOSCOPY (EGD) WITH PROPOFOL N/A 11/21/2021   Procedure: ESOPHAGOGASTRODUODENOSCOPY (EGD) WITH PROPOFOL;  Surgeon:  Malissa Hippo, MD;  [facility];  Service: Endoscopy;  Laterality: N/A;  820   HERNIA REPAIR Bilateral    inguinal   LASER ABLATION CONDOLAMATA N/A 12/02/2018   Procedure: LASER ABLATION CONDYLOMA ACCUMINATA LEFT AND RIGHT VULVA, PERINEUM AND PERIANAL (15 TOTAL);  Surgeon: Lazaro Arms, MD;  Location: [facility];  Service: Gynecology;  Laterality: N/A;   LUMBAR LAMINECTOMY/DECOMPRESSION MICRODISCECTOMY Right 12/03/2021   Procedure: MICRODISCECTOMY Lumbar four- five;  Surgeon: Tressie Stalker, MD;  Location: [facility] OR;  Service: Neurosurgery;  Laterality: Right;   MOUTH SURGERY  09/2021   All top teeth and all wisdom teeth removed   MOUTH SURGERY  12/2021   POLYPECTOMY  08/29/2020   Procedure: POLYPECTOMY;  Surgeon: Marguerita Merles, Reuel Boom, MD;  [facility];  Service: Gastroenterology;;   Family History Family History  Problem Relation Age of Onset   Anxiety disorder Mother    Hyperlipidemia Mother    Crohn's disease Sister    Diabetes Maternal Grandfather    Diabetes Cousin    Learning disabilities Cousin     Social History Social History   Tobacco Use   Smoking status: Every Day    Packs/day: 1.00    Years: 10.00    Additional pack years: 0.00    Total pack years: 10.00    Types: Cigarettes    Passive exposure: Current   Smokeless tobacco: Never  Vaping Use   Vaping Use: Former  Substance Use Topics   Alcohol use: Not Currently   Drug use: Yes    Comment: delta 9   Allergies Tape, Buspirone, Fluoxetine, and Nsaids  Review of Systems Review of Systems  Gastrointestinal:   Positive for abdominal pain, nausea and vomiting.    Physical Exam Vital Signs  I have reviewed the triage vital signs BP 126/84   Pulse (!) 111   Temp 97.8 F (36.6 C) (Oral)   Resp 17   Ht 5\' 8"  (1.727 m)   Wt 82.6 kg   LMP 12/30/2022   SpO2 97%   BMI 27.69 kg/m   Physical Exam Vitals and nursing note reviewed.  Constitutional:      General: She is in acute distress.     Appearance: She is well-developed.  HENT:     Head: Normocephalic and atraumatic.  Eyes:     Conjunctiva/sclera: Conjunctivae normal.  Cardiovascular:     Rate and Rhythm: Normal rate and regular rhythm.     Heart sounds: No murmur heard. Pulmonary:     Effort: Pulmonary effort is normal. No respiratory distress.     Breath sounds: Normal breath sounds.  Abdominal:     Palpations: Abdomen is soft.     Tenderness: There is abdominal tenderness in the epigastric area.  Musculoskeletal:        General: No swelling.     Cervical back: Neck supple.  Skin:    General: Skin is warm and dry.     Capillary Refill: Capillary refill takes less than 2 seconds.  Neurological:     Mental Status: She is alert.  Psychiatric:        Mood and Affect: Mood normal.     ED Results and Treatments Labs (all labs ordered are listed, but only abnormal results are displayed) Labs Reviewed  CBC WITH DIFFERENTIAL/PLATELET - Abnormal; Notable for the following components:      Result Value   WBC 15.8 (*)    Hemoglobin 15.5 (*)    Neutro Abs 12.9 (*)  Abs Immature Granulocytes 0.08 (*)    All other components within normal limits  COMPREHENSIVE METABOLIC PANEL - Abnormal; Notable for the following components:   Potassium 3.4 (*)    Glucose, Bld 125 (*)    All other components within normal limits  LIPASE, BLOOD                                                                                                                          Radiology No results found.  Pertinent labs & imaging results that were  available during my care of the patient were reviewed by me and considered in my medical decision making (see MDM for details).  Medications Ordered in ED Medications  droperidol (INAPSINE) 2.5 MG/ML injection 1.25 mg (1.25 mg Intravenous Given 01/23/23 0734)  LORazepam (ATIVAN) injection 0.5 mg (0.5 mg Intravenous Given 01/23/23 0713)  sucralfate (CARAFATE) 1 GM/10ML suspension 1 g (1 g Oral Given 01/23/23 0955)                                                                                                                                     Procedures Procedures  (including critical care time)  Medical Decision Making / ED Course   This patient presents to the ED for concern of abdominal pain nausea vomiting, this involves an extensive number of treatment options, and is a complaint that carries with it a high risk of complications and morbidity.  The differential diagnosis includes cyclic vomiting syndrome, cannabinol hyperemesis, GERD/gastritis, peptic ulcer disease, pancreatitis, gastroparesis, pneumonia, pleurisy, pericarditis  MDM: Patient seen emergency room for evaluation of epigastric pain and nausea and vomiting.  Physical exam reveals patient writhing around in the bed with some tenderness in the epigastrium.  Laboratory evaluation with leukocytosis to 15.8, potassium 3.4 but is otherwise unremarkable.  This leukocytosis is downtrending from 10 days ago and this fairly similar to labs seen 2 days ago.  Patient received single dose droperidol and Ativan due to concern for cannabinol hyperemesis and on reevaluation her symptoms have completely resolved.  She has had previous CT imaging with the exact same symptoms and these have been found to be negative and thus with complete resolution of symptoms with droperidol, suspect more cannabinol hyperemesis picture and thus CT imaging deferred.  Patient able to tolerate p.o. without difficulty.  Patient requesting single dose carafate prior to  discharge and patient ultimately discharged  with outpatient gastroenterology follow-up   Additional history obtained:  -External records from outside source obtained and reviewed including: Chart review including previous notes, labs, imaging, consultation notes   Lab Tests: -I ordered, reviewed, and interpreted labs.   The pertinent results include:   Labs Reviewed  CBC WITH DIFFERENTIAL/PLATELET - Abnormal; Notable for the following components:      Result Value   WBC 15.8 (*)    Hemoglobin 15.5 (*)    Neutro Abs 12.9 (*)    Abs Immature Granulocytes 0.08 (*)    All other components within normal limits  COMPREHENSIVE METABOLIC PANEL - Abnormal; Notable for the following components:   Potassium 3.4 (*)    Glucose, Bld 125 (*)    All other components within normal limits  LIPASE, BLOOD     Medicines ordered and prescription drug management: Meds ordered this encounter  Medications   droperidol (INAPSINE) 2.5 MG/ML injection 1.25 mg   LORazepam (ATIVAN) injection 0.5 mg   sucralfate (CARAFATE) 1 GM/10ML suspension 1 g    -I have reviewed the patients home medicines and have made adjustments as needed  Critical interventions none    Cardiac Monitoring: The patient was maintained on a cardiac monitor.  I personally viewed and interpreted the cardiac monitored which showed an underlying rhythm of: Sinus tachycardia, NSR  Social Determinants of Health:  Factors impacting patients care include: Daily marijuana use   Reevaluation: After the interventions noted above, I reevaluated the patient and found that they have :improved  Co morbidities that complicate the patient evaluation  Past Medical History:  Diagnosis Date   Anxiety    Bradycardia 04/17/2017   Chest pain on breathing 02/22/2020   Chronic back pain    Depression    Erosive gastritis 04/11/2017   Gastric ulcer    Genital warts    GERD (gastroesophageal reflux disease)    High cholesterol     History of gestational hypertension 05/21/2018   Dx intrapartum   History of kidney stones    Intertrigo 11/16/2019   Intractable abdominal pain 04/11/2017   Intractable nausea and vomiting 12/07/2018   Intractable vomiting 05/20/2020   Irregular intermenstrual bleeding 02/19/2019   Mental disorder    Migraine without aura and without status migrainosus, not intractable 02/19/2019   Nausea 05/10/2020   Nausea & vomiting 12/07/2018   Nexplanon in place 02/19/2019   Obesity 04/17/2017   Panic disorder    Peptic ulcer disease 04/13/2017   Pinched nerve 09/24/2019   Postpartum hypertension 06/23/2018   requiring norvasc, resolved at pp visit   Sciatica    Tremor of both hands    Vaginal Pap smear, abnormal    Wheezing       Dispostion: I considered admission for this patient, but at this time the symptoms improved she does not meet inpatient criteria for admission she is safe for discharge with outpatient follow-up     Final Clinical Impression(s) / ED Diagnoses Final diagnoses:  Cyclic vomiting syndrome  Cannabinoid hyperemesis syndrome     @PCDICTATION @    Glendora Score, MD 01/23/23 1750

## 2023-01-23 NOTE — ED Triage Notes (Signed)
Pt BIB [facility] c/o abd pain with N/V that started again this morning. Hx of stomach ulcers. Pt was demanding pain medication with EMS.   20 RAC

## 2023-01-24 DIAGNOSIS — F32A Depression, unspecified: Secondary | ICD-10-CM | POA: Diagnosis not present

## 2023-01-24 DIAGNOSIS — M5451 Vertebrogenic low back pain: Secondary | ICD-10-CM | POA: Diagnosis not present

## 2023-01-24 DIAGNOSIS — R110 Nausea: Secondary | ICD-10-CM | POA: Diagnosis not present

## 2023-01-24 DIAGNOSIS — Z79891 Long term (current) use of opiate analgesic: Secondary | ICD-10-CM | POA: Diagnosis not present

## 2023-01-29 ENCOUNTER — Other Ambulatory Visit: Payer: Self-pay

## 2023-01-29 ENCOUNTER — Emergency Department (HOSPITAL_COMMUNITY)
Admission: EM | Admit: 2023-01-29 | Discharge: 2023-01-29 | Disposition: A | Discharge status: Home / Self Care | Payer: Medicaid Other | Attending: Emergency Medicine | Admitting: Emergency Medicine

## 2023-01-29 DIAGNOSIS — R112 Nausea with vomiting, unspecified: Secondary | ICD-10-CM | POA: Insufficient documentation

## 2023-01-29 DIAGNOSIS — R1013 Epigastric pain: Secondary | ICD-10-CM | POA: Insufficient documentation

## 2023-01-29 LAB — CBC WITH DIFFERENTIAL/PLATELET
Abs Immature Granulocytes: 0.04 10*3/uL (ref 0.00–0.07)
Basophils Absolute: 0 10*3/uL (ref 0.0–0.1)
Basophils Relative: 0 %
Eosinophils Absolute: 0 10*3/uL (ref 0.0–0.5)
Eosinophils Relative: 0 %
HCT: 41.6 % (ref 36.0–46.0)
Hemoglobin: 14.5 g/dL (ref 12.0–15.0)
Immature Granulocytes: 0 %
Lymphocytes Relative: 14 %
Lymphs Abs: 1.5 10*3/uL (ref 0.7–4.0)
MCH: 32.4 pg (ref 26.0–34.0)
MCHC: 34.9 g/dL (ref 30.0–36.0)
MCV: 93.1 fL (ref 80.0–100.0)
Monocytes Absolute: 0.5 10*3/uL (ref 0.1–1.0)
Monocytes Relative: 4 %
Neutro Abs: 9.3 10*3/uL — ABNORMAL HIGH (ref 1.7–7.7)
Neutrophils Relative %: 82 %
Platelets: 200 10*3/uL (ref 150–400)
RBC: 4.47 MIL/uL (ref 3.87–5.11)
RDW: 12.2 % (ref 11.5–15.5)
WBC: 11.4 10*3/uL — ABNORMAL HIGH (ref 4.0–10.5)
nRBC: 0 % (ref 0.0–0.2)

## 2023-01-29 LAB — COMPREHENSIVE METABOLIC PANEL
ALT: 19 U/L (ref 0–44)
AST: 19 U/L (ref 15–41)
Albumin: 4.1 g/dL (ref 3.5–5.0)
Alkaline Phosphatase: 55 U/L (ref 38–126)
Anion gap: 8 (ref 5–15)
BUN: 8 mg/dL (ref 6–20)
CO2: 23 mmol/L (ref 22–32)
Calcium: 9.2 mg/dL (ref 8.9–10.3)
Chloride: 103 mmol/L (ref 98–111)
Creatinine, Ser: 0.76 mg/dL (ref 0.44–1.00)
GFR, Estimated: >60 mL/min (ref >60)
Glucose, Bld: 125 mg/dL — ABNORMAL HIGH (ref 70–99)
Potassium: 4 mmol/L (ref 3.5–5.1)
Sodium: 134 mmol/L — ABNORMAL LOW (ref 135–145)
Total Bilirubin: 0.7 mg/dL (ref 0.3–1.2)
Total Protein: 7.1 g/dL (ref 6.5–8.1)

## 2023-01-29 LAB — LIPASE, BLOOD: Lipase: 23 U/L (ref 11–51)

## 2023-01-29 MED ORDER — DROPERIDOL 2.5 MG/ML IJ SOLN
2.5000 mg | Freq: Once | INTRAMUSCULAR | Status: AC
  Administered 2023-01-29: 2.5 mg via INTRAVENOUS
  Filled 2023-01-29: qty 2

## 2023-01-29 MED ORDER — KETOROLAC TROMETHAMINE 30 MG/ML IJ SOLN
30.0000 mg | Freq: Once | INTRAMUSCULAR | Status: AC
  Administered 2023-01-29: 30 mg via INTRAVENOUS
  Filled 2023-01-29: qty 1

## 2023-01-29 MED ORDER — SODIUM CHLORIDE 0.9 % IV BOLUS
1000.0000 mL | Freq: Once | INTRAVENOUS | Status: AC
  Administered 2023-01-29: 1000 mL via INTRAVENOUS

## 2023-01-29 MED ORDER — ONDANSETRON 8 MG PO TBDP: ORAL_TABLET | ORAL | 0 refills | Status: DC

## 2023-01-29 MED ORDER — LORAZEPAM 2 MG/ML IJ SOLN
1.0000 mg | Freq: Once | INTRAMUSCULAR | Status: AC
  Administered 2023-01-29: 1 mg via INTRAVENOUS
  Filled 2023-01-29: qty 1

## 2023-01-29 MED ORDER — ONDANSETRON HCL 4 MG/2ML IJ SOLN
4.0000 mg | Freq: Once | INTRAMUSCULAR | Status: AC
  Administered 2023-01-29: 4 mg via INTRAVENOUS
  Filled 2023-01-29: qty 2

## 2023-01-29 NOTE — ED Notes (Signed)
Witnessed pt sticking finger down her throat to induce vomiting, immediately following seen taking selfies.

## 2023-01-29 NOTE — ED Triage Notes (Signed)
Pt BIB [facility] c/o abdominal pain. States she has hx of stomach ulcers. Per EMS she was demanding Morphine in route.

## 2023-01-29 NOTE — Discharge Instructions (Signed)
Begin taking Zofran as prescribed as needed for nausea.  Follow-up with your primary doctor in the next few days.

## 2023-01-29 NOTE — ED Notes (Signed)
Patient turned monitor off.

## 2023-01-29 NOTE — ED Provider Notes (Signed)
[place] EMERGENCY DEPARTMENT AT [facility] Provider Note   CSN: [id number] Arrival date & time: 01/29/23  0048     History  Chief Complaint  Patient presents with   Abdominal Pain    Debbie Santana is a 32 y.o. female.  Patient is a 32 year old female with past medical history of peptic ulcer disease, anxiety, cannabis abuse.  Patient presenting today with complaints of abdominal pain and vomiting.  This started earlier today.  She reports she has been unable to keep anything down.  She denies any diarrhea, bloody vomit, fevers, or chills.  There are no aggravating or alleviating factors.  She does report marijuana use yesterday.  This is her sixth visit to the emergency department in the past 3 weeks with similar presentation.  The history is provided by the patient.       Home Medications Prior to Admission medications   Medication Sig Start Date End Date Taking? Authorizing Provider  albuterol (VENTOLIN HFA) 108 (90 Base) MCG/ACT inhaler Inhale 2 puffs into the lungs every 6 (six) hours as needed for wheezing or shortness of breath. 09/11/20   Catarina Hartshorn, MD  butalbital-acetaminophen-caffeine (FIORICET) [phone] MG tablet TAKE 1 TABLET BY MOUTH EVERY 6 HOURS AS NEEDED FOR MIGRAINE HEADACHE 12/04/20   Adline Potter, NP  clonazePAM (KLONOPIN) 1 MG tablet Take 1 mg by mouth 4 (four) times daily as needed. 10/23/22   [provider]  dicyclomine (BENTYL) 20 MG tablet Take 1 tablet (20 mg total) by mouth 2 (two) times daily. 01/16/23   Marita Kansas, PA-C  diphenhydrAMINE (BENADRYL) 25 MG tablet Take 25 mg by mouth every 6 (six) hours as needed for allergies.    [provider]  etonogestrel (NEXPLANON) 68 MG IMPL implant 68 mg by Subdermal route once.    [provider]  hyoscyamine (LEVSIN SL) 0.125 MG SL tablet Place 1 tablet (0.125 mg total) under the tongue every 6 (six) hours as needed. 05/07/22   Carlan, Jeral Pinch, NP  naloxone [facility])  2 MG/2ML injection Spray one-half (1 ml) of syringe into each nostril upon signs of opioid overdose. Call 911. May repeat in 3-5 minutes as needed, as directed 01/21/23   [provider]  NURTEC 75 MG TBDP Take 75 mg by mouth as needed (migraine). Patient not taking: Reported on 12/24/2022 10/23/22   [provider]  ondansetron (ZOFRAN-ODT) 4 MG disintegrating tablet Take 1 tablet (4 mg total) by mouth every 8 (eight) hours as needed for nausea. 01/21/23   Eber Hong, MD  oxyCODONE-acetaminophen (PERCOCET) 10-325 MG tablet Take 1 tablet by mouth See admin instructions. Take 1 tablet 1-4 times daily 06/05/22   [provider]  promethazine (PHENERGAN) 25 MG suppository Place 1 suppository (25 mg total) rectally every 6 (six) hours as needed for nausea or vomiting. 01/21/23   Eber Hong, MD  propranolol (INDERAL) 10 MG tablet Take 1 tablet (10 mg total) by mouth 2 (two) times daily. 11/12/22   Ocie Doyne, MD  sertraline (ZOLOFT) 50 MG tablet Take 200 mg by mouth daily.    [provider]  SPIRIVA RESPIMAT 2.5 MCG/ACT AERS Inhale 2 each into the lungs daily. 09/24/22   [provider]  sucralfate (CARAFATE) 1 g tablet Take 1 tablet (1 g total) by mouth 3 (three) times daily before meals. 11/06/21   Carlan, Chelsea L, NP  sulfamethoxazole-trimethoprim (BACTRIM DS) 800-160 MG tablet Take 1 tablet by mouth 2 (two) times daily. X 7 days  Patient not taking: Reported on 12/24/2022 12/09/22   Cheral Marker, CNM  tiZANidine (ZANAFLEX) 4 MG tablet Take 4 mg by mouth at bedtime. 10/23/22   [provider]  traZODone (DESYREL) 50 MG tablet Take 100 mg by mouth at bedtime as needed. 01/21/23   [provider]  VRAYLAR 1.5 MG capsule Take 1.5 mg by mouth daily. 09/27/22   [provider]      Allergies    Tape, Buspirone, Fluoxetine, and Nsaids    Review of Systems   Review of Systems  All other systems reviewed and are  negative.   Physical Exam Updated Vital Signs BP (!) 149/106   Pulse 84   Temp 98.7 F (37.1 C) (Oral)   Resp 15   Ht 5\' 8"  (1.727 m)   Wt 83 kg   LMP 12/30/2022   SpO2 97%   BMI 27.82 kg/m  Physical Exam Vitals and nursing note reviewed.  Constitutional:      General: She is not in acute distress.    Appearance: She is well-developed. She is not diaphoretic.  HENT:     Head: Normocephalic and atraumatic.  Cardiovascular:     Rate and Rhythm: Normal rate and regular rhythm.     Heart sounds: No murmur heard.    No friction rub. No gallop.  Pulmonary:     Effort: Pulmonary effort is normal. No respiratory distress.     Breath sounds: Normal breath sounds. No wheezing.  Abdominal:     General: Bowel sounds are normal. There is no distension.     Palpations: Abdomen is soft.     Tenderness: There is abdominal tenderness in the epigastric area. There is no right CVA tenderness, left CVA tenderness, guarding or rebound.  Musculoskeletal:        General: Normal range of motion.     Cervical back: Normal range of motion and neck supple.  Skin:    General: Skin is warm and dry.  Neurological:     General: No focal deficit present.     Mental Status: She is alert and oriented to person, place, and time.     ED Results / Procedures / Treatments   Labs (all labs ordered are listed, but only abnormal results are displayed) Labs Reviewed - No data to display  EKG None  Radiology No results found.  Procedures Procedures    Medications Ordered in ED Medications  sodium chloride 0.9 % bolus 1,000 mL (has no administration in time range)  LORazepam (ATIVAN) injection 1 mg (has no administration in time range)  droperidol (INAPSINE) 2.5 MG/ML injection 2.5 mg (has no administration in time range)    ED Course/ Medical Decision Making/ A&P  Patient brought by EMS for evaluation of abdominal pain and vomiting as described in the HPI.  This is her seventh visit in the  last 3 weeks to this facility or outside facility with the same complaint.  Patient arrives here with stable vital signs and is afebrile.  She is clinically well-appearing.  Physical examination reveals tenderness in the epigastric region.  Workup initiated including CBC, CMP, and lipase, all of which were unremarkable with the exception of a mild white count of 11.4.  There are no elevations of her liver or pancreatic enzymes and there are no electrolyte derangements.  Patient has been hydrated with normal saline.  She has also received droperidol and Ativan.  She is now resting comfortably but occasionally complains of abdominal pain.  She  was then given Toradol and Zofran.  At this point, I see no indication to perform further workup.  She has had 2 CT scans within the past 2 weeks showing no acute intra-abdominal pathology.  With previous visits, symptoms have been thought to be related to cannabis hyperemesis.    Final Clinical Impression(s) / ED Diagnoses Final diagnoses:  None    Rx / DC Orders ED Discharge Orders     None         Geoffery Lyons, MD 01/29/23 [phone]

## 2023-01-30 ENCOUNTER — Telehealth: Payer: Self-pay | Admitting: *Deleted

## 2023-01-30 ENCOUNTER — Emergency Department (HOSPITAL_COMMUNITY)
Admission: EM | Admit: 2023-01-30 | Discharge: 2023-01-30 | Disposition: A | Discharge status: Home / Self Care | Payer: Medicaid Other | Attending: Emergency Medicine | Admitting: Emergency Medicine

## 2023-01-30 ENCOUNTER — Encounter (HOSPITAL_COMMUNITY): Payer: Self-pay

## 2023-01-30 ENCOUNTER — Other Ambulatory Visit: Payer: Self-pay

## 2023-01-30 DIAGNOSIS — R112 Nausea with vomiting, unspecified: Secondary | ICD-10-CM | POA: Diagnosis not present

## 2023-01-30 DIAGNOSIS — R1013 Epigastric pain: Secondary | ICD-10-CM | POA: Insufficient documentation

## 2023-01-30 DIAGNOSIS — R110 Nausea: Secondary | ICD-10-CM | POA: Diagnosis not present

## 2023-01-30 DIAGNOSIS — R1010 Upper abdominal pain, unspecified: Secondary | ICD-10-CM | POA: Diagnosis not present

## 2023-01-30 DIAGNOSIS — I10 Essential (primary) hypertension: Secondary | ICD-10-CM | POA: Diagnosis not present

## 2023-01-30 DIAGNOSIS — R1084 Generalized abdominal pain: Secondary | ICD-10-CM | POA: Diagnosis not present

## 2023-01-30 DIAGNOSIS — R1111 Vomiting without nausea: Secondary | ICD-10-CM | POA: Diagnosis not present

## 2023-01-30 LAB — COMPREHENSIVE METABOLIC PANEL
ALT: 15 U/L (ref 0–44)
AST: 13 U/L — ABNORMAL LOW (ref 15–41)
Albumin: 3.8 g/dL (ref 3.5–5.0)
Alkaline Phosphatase: 57 U/L (ref 38–126)
Anion gap: 8 (ref 5–15)
BUN: 8 mg/dL (ref 6–20)
CO2: 23 mmol/L (ref 22–32)
Calcium: 9.2 mg/dL (ref 8.9–10.3)
Chloride: 106 mmol/L (ref 98–111)
Creatinine, Ser: 0.82 mg/dL (ref 0.44–1.00)
GFR, Estimated: >60 mL/min (ref >60)
Glucose, Bld: 99 mg/dL (ref 70–99)
Potassium: 3.6 mmol/L (ref 3.5–5.1)
Sodium: 137 mmol/L (ref 135–145)
Total Bilirubin: 0.7 mg/dL (ref 0.3–1.2)
Total Protein: 6.3 g/dL — ABNORMAL LOW (ref 6.5–8.1)

## 2023-01-30 LAB — LIPASE, BLOOD: Lipase: 27 U/L (ref 11–51)

## 2023-01-30 LAB — CBC
HCT: 40.9 % (ref 36.0–46.0)
Hemoglobin: 14 g/dL (ref 12.0–15.0)
MCH: 32.6 pg (ref 26.0–34.0)
MCHC: 34.2 g/dL (ref 30.0–36.0)
MCV: 95.1 fL (ref 80.0–100.0)
Platelets: 216 10*3/uL (ref 150–400)
RBC: 4.3 MIL/uL (ref 3.87–5.11)
RDW: 12.3 % (ref 11.5–15.5)
WBC: 11.8 10*3/uL — ABNORMAL HIGH (ref 4.0–10.5)
nRBC: 0 % (ref 0.0–0.2)

## 2023-01-30 LAB — LACTIC ACID, PLASMA: Lactic Acid, Venous: 0.7 mmol/L (ref 0.5–1.9)

## 2023-01-30 MED ORDER — HYOSCYAMINE SULFATE 0.125 MG SL SUBL: 0.1250 mg | SUBLINGUAL_TABLET | Freq: Four times a day (QID) | SUBLINGUAL | 1 refills | Status: DC | PRN

## 2023-01-30 MED ORDER — DICYCLOMINE HCL 10 MG/ML IM SOLN
20.0000 mg | Freq: Once | INTRAMUSCULAR | Status: AC
  Administered 2023-01-30: 20 mg via INTRAMUSCULAR
  Filled 2023-01-30: qty 2

## 2023-01-30 MED ORDER — SUCRALFATE 1 GM/10ML PO SUSP
1.0000 g | ORAL | Status: AC
  Administered 2023-01-30: 1 g via ORAL
  Filled 2023-01-30: qty 10

## 2023-01-30 MED ORDER — LORAZEPAM 2 MG/ML IJ SOLN
1.0000 mg | Freq: Once | INTRAMUSCULAR | Status: AC
  Administered 2023-01-30: 1 mg via INTRAVENOUS
  Filled 2023-01-30: qty 1

## 2023-01-30 MED ORDER — ONDANSETRON HCL 4 MG/2ML IJ SOLN
4.0000 mg | Freq: Once | INTRAMUSCULAR | Status: AC | PRN
  Administered 2023-01-30: 4 mg via INTRAVENOUS
  Filled 2023-01-30: qty 2

## 2023-01-30 MED ORDER — DICYCLOMINE HCL 20 MG PO TABS: 20.0000 mg | ORAL_TABLET | Freq: Two times a day (BID) | ORAL | 0 refills | Status: DC

## 2023-01-30 MED ORDER — SODIUM CHLORIDE 0.9 % IV SOLN: Freq: Once | INTRAVENOUS | Status: AC

## 2023-01-30 MED ORDER — SODIUM CHLORIDE 0.9 % IV BOLUS
1000.0000 mL | Freq: Once | INTRAVENOUS | Status: AC
  Administered 2023-01-30: 1000 mL via INTRAVENOUS

## 2023-01-30 MED ORDER — SUCRALFATE 1 GM/10ML PO SUSP: 1.0000 g | Freq: Three times a day (TID) | ORAL | Status: DC

## 2023-01-30 NOTE — Discharge Instructions (Signed)
Your testing has not shown any specific abnormalities.  Your vital signs and blood work were normal.  Please continue to take your Protonix, I have also added the dicyclomine or Bentyl and Levsin.  These have all been prescribed, see your GI appointment in the next couple of days as scheduled  Thank you for allowing [place] to treat you in the emergency department today.  After reviewing your examination and potential testing that was done it appears that you are safe to go home.  I would like for you to follow-up with your doctor within the next several days, have them obtain your results and follow-up with them to review all of these tests.  If you should develop severe or worsening symptoms return to the emergency department immediately

## 2023-01-30 NOTE — ED Triage Notes (Signed)
Pt c/o abd pain . Pt states she tried the suppository but was not effective for vomiting. Pt requests to be admitted to hospital. Pt states she has not been able keep any food down, therefore unable to keep pain meds down. 20 G IV in right AC per [facility]. EMS gave 15mg  of tramadol. Pt states pain radiates to her back 10 out of 10 pain.

## 2023-01-30 NOTE — ED Provider Notes (Signed)
[place] EMERGENCY DEPARTMENT AT [facility] Provider Note   CSN: [id number] Arrival date & time: 01/30/23  1117     History  Chief Complaint  Patient presents with   Abdominal Pain    Debbie Santana is a 32 y.o. female with a history of erosive gastritis, cannabis hyperemesis syndrome, and intractable nausea and vomiting who presents today for abdominal pain and vomiting. She reports vomiting 4 times by the time of arrival in the ED. Both the vomiting and abdominal pain started simultaneously 2 weeks ago and she has not been able to hold down much food since. Patient states that she does not know what triggered this. Vomit is yellow/green in color and without blood. She denies shortness of breath, chest pain, fever, or cough. She has an appointment next week to follow up with her GI specialist.    Home Medications Prior to Admission medications   Medication Sig Start Date End Date Taking? Authorizing Provider  albuterol (VENTOLIN HFA) 108 (90 Base) MCG/ACT inhaler Inhale 2 puffs into the lungs every 6 (six) hours as needed for wheezing or shortness of breath. 09/11/20   Catarina Hartshorn, MD  butalbital-acetaminophen-caffeine (FIORICET) [phone] MG tablet TAKE 1 TABLET BY MOUTH EVERY 6 HOURS AS NEEDED FOR MIGRAINE HEADACHE 12/04/20   Adline Potter, NP  clonazePAM (KLONOPIN) 1 MG tablet Take 1 mg by mouth 4 (four) times daily as needed. 10/23/22   [provider]  dicyclomine (BENTYL) 20 MG tablet Take 1 tablet (20 mg total) by mouth 2 (two) times daily. 01/30/23   Eber Hong, MD  diphenhydrAMINE (BENADRYL) 25 MG tablet Take 25 mg by mouth every 6 (six) hours as needed for allergies.    [provider]  etonogestrel (NEXPLANON) 68 MG IMPL implant 68 mg by Subdermal route once.    [provider]  hyoscyamine (LEVSIN SL) 0.125 MG SL tablet Place 1 tablet (0.125 mg total) under the tongue every 6 (six) hours as needed. 01/30/23   Eber Hong, MD   naloxone [facility]) 2 MG/2ML injection Spray one-half (1 ml) of syringe into each nostril upon signs of opioid overdose. Call 911. May repeat in 3-5 minutes as needed, as directed 01/21/23   [provider]  NURTEC 75 MG TBDP Take 75 mg by mouth as needed (migraine). Patient not taking: Reported on 12/24/2022 10/23/22   [provider]  ondansetron (ZOFRAN-ODT) 8 MG disintegrating tablet 8mg  ODT q4 hours prn nausea 01/29/23   Geoffery Lyons, MD  oxyCODONE-acetaminophen (PERCOCET) 10-325 MG tablet Take 1 tablet by mouth See admin instructions. Take 1 tablet 1-4 times daily 06/05/22   [provider]  promethazine (PHENERGAN) 25 MG suppository Place 1 suppository (25 mg total) rectally every 6 (six) hours as needed for nausea or vomiting. 01/21/23   Eber Hong, MD  propranolol (INDERAL) 10 MG tablet Take 1 tablet (10 mg total) by mouth 2 (two) times daily. 11/12/22   Ocie Doyne, MD  sertraline (ZOLOFT) 50 MG tablet Take 200 mg by mouth daily.    [provider]  SPIRIVA RESPIMAT 2.5 MCG/ACT AERS Inhale 2 each into the lungs daily. 09/24/22   [provider]  sucralfate (CARAFATE) 1 g tablet Take 1 tablet (1 g total) by mouth 3 (three) times daily before meals. 11/06/21   Carlan, Chelsea L, NP  sulfamethoxazole-trimethoprim (BACTRIM DS) 800-160 MG tablet Take 1 tablet by mouth 2 (two) times daily. X 7 days Patient not taking: Reported on 12/24/2022 12/09/22   Grace Bushy,  Merlene Laughter, CNM  tiZANidine (ZANAFLEX) 4 MG tablet Take 4 mg by mouth at bedtime. 10/23/22   [provider]  traZODone (DESYREL) 50 MG tablet Take 100 mg by mouth at bedtime as needed. 01/21/23   [provider]  VRAYLAR 1.5 MG capsule Take 1.5 mg by mouth daily. 09/27/22   [provider]      Allergies    Tape, Buspirone, Fluoxetine, and Nsaids    Review of Systems   Review of Systems  Constitutional:  Positive for chills and fever.  Gastrointestinal:  Positive for  abdominal pain, diarrhea and vomiting.  Musculoskeletal:  Positive for back pain.  All other systems reviewed and are negative.   Physical Exam Updated Vital Signs BP (!) 134/97 (BP Location: Left Arm)   Pulse 84   Temp 97.8 F (36.6 C) (Oral)   Resp 17   Ht 5\' 8"  (1.727 m)   Wt 78.9 kg   LMP 12/30/2022   SpO2 97%   BMI 26.46 kg/m  Physical Exam Vitals and nursing note reviewed.  Constitutional:      Appearance: Normal appearance.  HENT:     Head: Normocephalic and atraumatic.     Mouth/Throat:     Mouth: Mucous membranes are moist.  Eyes:     Conjunctiva/sclera: Conjunctivae normal.     Pupils: Pupils are equal, round, and reactive to light.  Cardiovascular:     Rate and Rhythm: Normal rate and regular rhythm.     Pulses: Normal pulses.  Pulmonary:     Effort: Pulmonary effort is normal.     Breath sounds: Normal breath sounds.  Abdominal:     Palpations: Abdomen is soft.     Tenderness: There is abdominal tenderness in the epigastric area.  Skin:    General: Skin is warm and dry.     Findings: No rash.  Neurological:     General: No focal deficit present.     Mental Status: She is alert.  Psychiatric:        Mood and Affect: Mood normal.        Behavior: Behavior normal.     ED Results / Procedures / Treatments   Labs (all labs ordered are listed, but only abnormal results are displayed) Labs Reviewed  COMPREHENSIVE METABOLIC PANEL - Abnormal; Notable for the following components:      Result Value   Total Protein 6.3 (*)    AST 13 (*)    All other components within normal limits  CBC - Abnormal; Notable for the following components:   WBC 11.8 (*)    All other components within normal limits  LIPASE, BLOOD  LACTIC ACID, PLASMA  URINALYSIS, ROUTINE W REFLEX MICROSCOPIC  POC URINE PREG, ED    EKG None  Radiology No results found.  Procedures Procedures    Medications Ordered in ED Medications  ondansetron (ZOFRAN) injection 4 mg (4 mg  Intravenous Given 01/30/23 1233)  0.9 %  sodium chloride infusion ( Intravenous Stopped 01/30/23 1409)  LORazepam (ATIVAN) injection 1 mg (1 mg Intravenous Given 01/30/23 1232)  sodium chloride 0.9 % bolus 1,000 mL (0 mLs Intravenous Stopped 01/30/23 1340)  sucralfate (CARAFATE) 1 GM/10ML suspension 1 g (1 g Oral Given 01/30/23 1409)  dicyclomine (BENTYL) injection 20 mg (20 mg Intramuscular Given 01/30/23 1411)    ED Course/ Medical Decision Making/ A&P  Medical Decision Making Amount and/or Complexity of Data Reviewed Labs: ordered.  Risk Prescription drug management.   Patient evaluated today for abdominal pain and vomiting. Labs were ordered to assess electrolytes and fluids were given to hydrate patient. Ativan was given per patient's request for anxiety. No imaging was ordered due to recent CT abdomen/pelvis and ultrasounds several weeks ago which were all unremarkable, without any no acute findings. Patient stable to be discharged home and follow up with GI next week.        Final Clinical Impression(s) / ED Diagnoses Final diagnoses:  Pain of upper abdomen    Rx / DC Orders ED Discharge Orders          Ordered    dicyclomine (BENTYL) 20 MG tablet  2 times daily        01/30/23 1337    hyoscyamine (LEVSIN SL) 0.125 MG SL tablet  Every 6 hours PRN        01/30/23 1337              Maxwell Marion, PA-C 01/30/23 1547    Eber Hong, MD 02/03/23 1014

## 2023-01-30 NOTE — Transitions of Care (Post Inpatient/ED Visit) (Signed)
   01/30/2023  Name: Debbie Santana MRN: [id number] DOB: 12/20/1990  Today's TOC FU Call Status: Today's TOC FU Call Status:: Unsuccessul Call (1st Attempt) Unsuccessful Call (1st Attempt) Date: 01/30/23  Attempted to reach the patient regarding the most recent Inpatient/ED visit.  Follow Up Plan: Additional outreach attempts will be made to reach the patient to complete the Transitions of Care (Post Inpatient/ED visit) call.   Estanislado Emms RN, BSN [place]  Managed [facility] RN Care Coordinator [phone]

## 2023-01-30 NOTE — ED Provider Notes (Signed)
This patient is a 32 year old female presenting with abdominal pain, there is some chronic nature to this and she has already had endoscopy and has follow-up with GI next week.  She presents with increasing nausea, and vomits when she tries to eat solids but is able to eat liquids, there is burning sensation it is in the epigastrium going to the back.  No fevers or chills, no swelling of the legs, no coughing or shortness of breath, vitals and labs were unremarkable, the patient will be treated for what appears to be some type of peptic ulcer disease, she has had multiple CT scans ultrasounds and other studies that show that she does not need another imaging study today and certainly no more radiation.  The patient is agreeable to the plan.  Stable for discharge  Medical screening examination/treatment/procedure(s) were conducted as a shared visit with non-physician practitioner(s) and myself.  I personally evaluated the patient during the encounter.  Clinical Impression:   Final diagnoses:  Pain of upper abdomen         Eber Hong, MD 02/03/23 1014

## 2023-01-31 ENCOUNTER — Emergency Department (HOSPITAL_COMMUNITY)
Admission: EM | Admit: 2023-01-31 | Discharge: 2023-01-31 | Disposition: A | Discharge status: Home / Self Care | Payer: Medicaid Other | Attending: Emergency Medicine | Admitting: Emergency Medicine

## 2023-01-31 ENCOUNTER — Encounter (HOSPITAL_COMMUNITY): Payer: Self-pay | Admitting: *Deleted

## 2023-01-31 ENCOUNTER — Other Ambulatory Visit: Payer: Self-pay

## 2023-01-31 DIAGNOSIS — R1010 Upper abdominal pain, unspecified: Secondary | ICD-10-CM | POA: Diagnosis present

## 2023-01-31 DIAGNOSIS — R1013 Epigastric pain: Secondary | ICD-10-CM | POA: Insufficient documentation

## 2023-01-31 DIAGNOSIS — D72829 Elevated white blood cell count, unspecified: Secondary | ICD-10-CM | POA: Insufficient documentation

## 2023-01-31 DIAGNOSIS — R1084 Generalized abdominal pain: Secondary | ICD-10-CM | POA: Diagnosis not present

## 2023-01-31 DIAGNOSIS — E876 Hypokalemia: Secondary | ICD-10-CM | POA: Insufficient documentation

## 2023-01-31 LAB — URINALYSIS, ROUTINE W REFLEX MICROSCOPIC
Bilirubin Urine: NEGATIVE
Glucose, UA: NEGATIVE mg/dL
Ketones, ur: NEGATIVE mg/dL
Nitrite: NEGATIVE
Protein, ur: 30 mg/dL — AB
Specific Gravity, Urine: 1.021 (ref 1.005–1.030)
WBC, UA: >50 WBC/hpf (ref 0–5)
pH: 6 (ref 5.0–8.0)

## 2023-01-31 LAB — COMPREHENSIVE METABOLIC PANEL
ALT: 27 U/L (ref 0–44)
AST: 27 U/L (ref 15–41)
Albumin: 4 g/dL (ref 3.5–5.0)
Alkaline Phosphatase: 59 U/L (ref 38–126)
Anion gap: 8 (ref 5–15)
BUN: 8 mg/dL (ref 6–20)
CO2: 23 mmol/L (ref 22–32)
Calcium: 9.3 mg/dL (ref 8.9–10.3)
Chloride: 105 mmol/L (ref 98–111)
Creatinine, Ser: 0.81 mg/dL (ref 0.44–1.00)
GFR, Estimated: >60 mL/min (ref >60)
Glucose, Bld: 94 mg/dL (ref 70–99)
Potassium: 3.3 mmol/L — ABNORMAL LOW (ref 3.5–5.1)
Sodium: 136 mmol/L (ref 135–145)
Total Bilirubin: 0.7 mg/dL (ref 0.3–1.2)
Total Protein: 6.7 g/dL (ref 6.5–8.1)

## 2023-01-31 LAB — CBC
HCT: 43.5 % (ref 36.0–46.0)
Hemoglobin: 14.8 g/dL (ref 12.0–15.0)
MCH: 32.3 pg (ref 26.0–34.0)
MCHC: 34 g/dL (ref 30.0–36.0)
MCV: 95 fL (ref 80.0–100.0)
Platelets: 212 10*3/uL (ref 150–400)
RBC: 4.58 MIL/uL (ref 3.87–5.11)
RDW: 12.1 % (ref 11.5–15.5)
WBC: 13.7 10*3/uL — ABNORMAL HIGH (ref 4.0–10.5)
nRBC: 0 % (ref 0.0–0.2)

## 2023-01-31 LAB — POC URINE PREG, ED: Preg Test, Ur: NEGATIVE

## 2023-01-31 LAB — LIPASE, BLOOD: Lipase: 26 U/L (ref 11–51)

## 2023-01-31 MED ORDER — PANTOPRAZOLE SODIUM 40 MG PO TBEC: 40.0000 mg | DELAYED_RELEASE_TABLET | Freq: Every day | ORAL | 0 refills | Status: DC

## 2023-01-31 MED ORDER — LACTATED RINGERS IV BOLUS
1000.0000 mL | Freq: Once | INTRAVENOUS | Status: AC
  Administered 2023-01-31: 1000 mL via INTRAVENOUS

## 2023-01-31 MED ORDER — CEPHALEXIN 500 MG PO CAPS
500.0000 mg | ORAL_CAPSULE | Freq: Once | ORAL | Status: AC
  Administered 2023-01-31: 500 mg via ORAL
  Filled 2023-01-31: qty 1

## 2023-01-31 MED ORDER — ONDANSETRON HCL 4 MG/2ML IJ SOLN
4.0000 mg | Freq: Once | INTRAMUSCULAR | Status: AC
  Administered 2023-01-31: 4 mg via INTRAVENOUS
  Filled 2023-01-31: qty 2

## 2023-01-31 MED ORDER — POTASSIUM CHLORIDE CRYS ER 20 MEQ PO TBCR
20.0000 meq | EXTENDED_RELEASE_TABLET | Freq: Once | ORAL | Status: AC
  Administered 2023-01-31: 20 meq via ORAL
  Filled 2023-01-31: qty 1

## 2023-01-31 MED ORDER — CEPHALEXIN 500 MG PO CAPS: 500.0000 mg | ORAL_CAPSULE | Freq: Two times a day (BID) | ORAL | 0 refills | Status: DC

## 2023-01-31 MED ORDER — DROPERIDOL 2.5 MG/ML IJ SOLN
2.5000 mg | Freq: Once | INTRAMUSCULAR | Status: AC
  Administered 2023-01-31: 2.5 mg via INTRAVENOUS
  Filled 2023-01-31: qty 2

## 2023-01-31 MED ORDER — FAMOTIDINE IN NACL 20-0.9 MG/50ML-% IV SOLN
20.0000 mg | Freq: Once | INTRAVENOUS | Status: AC
  Administered 2023-01-31: 20 mg via INTRAVENOUS
  Filled 2023-01-31: qty 50

## 2023-01-31 MED ORDER — ALUM & MAG HYDROXIDE-SIMETH 200-200-20 MG/5ML PO SUSP
30.0000 mL | Freq: Once | ORAL | Status: AC
  Administered 2023-01-31: 30 mL via ORAL
  Filled 2023-01-31: qty 30

## 2023-01-31 NOTE — ED Triage Notes (Signed)
Pt brought in by [facility] from home with c/o abdominal pain and vomiting. Hx of stomach ulcers. Pt was seen yesterday in this ED and was discharged. No vomiting blood or blood in stool. Temp 99.3, BP 134/84, HR 100, O2 sat 97% for EMS.

## 2023-01-31 NOTE — Discharge Instructions (Addendum)
It was a pleasure taking care of you today.  You were evaluated for your ongoing upper abdominal pain.  We are not sure of the exact cause of your pain so is important that you follow-up with your GI doctor on Monday.  It may be a combination of cannabis hyperemesis and your stomach ulcers flaring up.  Continue taking your Carafate and Protonix.  You are also given prescription for Keflex for UTI.  Please make sure you take this.  Come back to the ER if you have pain in your back, fever, persistent vomiting or any other worsening symptoms.

## 2023-01-31 NOTE — ED Triage Notes (Signed)
Pt states she is having ulcer pain and is unable to eat due to the pain; pt has been seen here 2 times this week already for same complaint

## 2023-01-31 NOTE — ED Provider Triage Note (Signed)
Emergency Medicine Provider Triage Evaluation Note  Debbie Santana , a 32 y.o. female  was evaluated in triage.  Pt complains of abdominal pain radiating into the back for a week  Review of Systems  Positive: Abdominal pain, nausea and vomiting Negative: Fevers  Physical Exam  BP 135/84 (BP Location: Right Arm)   Pulse (!) 109   Temp 98.5 F (36.9 C) (Oral)   Resp 20   Ht 5\' 8"  (1.727 m)   Wt 78.9 kg   LMP 12/30/2022   SpO2 95%   BMI 26.45 kg/m  Gen:   Awake, no distress   Resp:  Normal effort  MSK:   Moves extremities without difficulty  Other:    Medical Decision Making  Medically screening exam initiated at 2:01 PM.  Appropriate orders placed.  Debbie Santana was informed that the remainder of the evaluation will be completed by another provider, this initial triage assessment does not replace that evaluation, and the importance of remaining in the ED until their evaluation is complete.     [name], [place] 01/31/23 1402

## 2023-01-31 NOTE — ED Provider Notes (Signed)
[place] EMERGENCY DEPARTMENT AT [facility] Provider Note   CSN: [id number] Arrival date & time: 01/31/23  1100     History  Chief Complaint  Patient presents with   Abdominal Pain    Debbie Santana is a 32 y.o. female.  She reports history of gastric ulcers.  She is here in the ED today complaining of upper abdominal pain that radiates to her back.  Feels similar to prior ulcers.  States it has been going on for a week.  She states she was seen yesterday and given some medications but is still having discomfort, she still vomiting today.  She has been taking her ranitidine and Protonix.  Also taking Carafate.  She states she cannot afford the Bentyl she was prescribed or the Levsin so did not pick these up.  She had previously been told that her stomach pain could be due to cannabis use so has not smoked marijuana in 3 days.  Abdominal Pain      Home Medications Prior to Admission medications   Medication Sig Start Date End Date Taking? Authorizing Provider  butalbital-acetaminophen-caffeine (FIORICET) 50-325-40 MG tablet TAKE 1 TABLET BY MOUTH EVERY 6 HOURS AS NEEDED FOR MIGRAINE HEADACHE 12/04/20  Yes Cyril Mourning A, NP  cephALEXin (KEFLEX) 500 MG capsule Take 1 capsule (500 mg total) by mouth 2 (two) times daily for 5 days. 01/31/23 02/05/23 Yes Aine Strycharz A, PA-C  clonazePAM (KLONOPIN) 1 MG tablet Take 1 mg by mouth 4 (four) times daily as needed. 10/23/22  Yes [provider]  dicyclomine (BENTYL) 20 MG tablet Take 1 tablet (20 mg total) by mouth 2 (two) times daily. 01/30/23  Yes Eber Hong, MD  diphenhydrAMINE (BENADRYL) 25 MG tablet Take 25 mg by mouth every 6 (six) hours as needed for allergies.   Yes [provider]  etonogestrel (NEXPLANON) 68 MG IMPL implant 68 mg by Subdermal route once.   Yes [provider]  hyoscyamine (LEVSIN SL) 0.125 MG SL tablet Place 1 tablet (0.125 mg total) under the tongue every 6 (six) hours as  needed. 01/30/23  Yes Eber Hong, MD  naloxone [facility]) 2 MG/2ML injection Spray one-half (1 ml) of syringe into each nostril upon signs of opioid overdose. Call 911. May repeat in 3-5 minutes as needed, as directed 01/21/23  Yes [provider]  ondansetron (ZOFRAN-ODT) 8 MG disintegrating tablet 8mg  ODT q4 hours prn nausea 01/29/23  Yes Delo, Riley Lam, MD  oxyCODONE-acetaminophen (PERCOCET) 10-325 MG tablet Take 1 tablet by mouth See admin instructions. Take 1 tablet 1-4 times daily 06/05/22  Yes [provider]  pantoprazole (PROTONIX) 40 MG tablet Take 1 tablet (40 mg total) by mouth daily. 01/31/23  Yes Lance Galas A, PA-C  propranolol (INDERAL) 10 MG tablet Take 1 tablet (10 mg total) by mouth 2 (two) times daily. 11/12/22  Yes Ocie Doyne, MD  sertraline (ZOLOFT) 50 MG tablet Take 200 mg by mouth daily.   Yes [provider]  SPIRIVA RESPIMAT 2.5 MCG/ACT AERS Inhale 2 each into the lungs daily. 09/24/22  Yes [provider]  sucralfate (CARAFATE) 1 g tablet Take 1 tablet (1 g total) by mouth 3 (three) times daily before meals. 11/06/21  Yes Carlan, Chelsea L, NP  tiZANidine (ZANAFLEX) 4 MG capsule SMARTSIG:1-2 Capsule(s) By Mouth Every 6-8 Hours PRN 01/22/23  Yes [provider]  albuterol (VENTOLIN HFA) 108 (90 Base) MCG/ACT inhaler Inhale 2 puffs into the lungs every 6 (six) hours as needed for  wheezing or shortness of breath. Patient not taking: Reported on 01/31/2023 09/11/20   TatOnalee Hua, MD  NURTEC 75 MG TBDP Take 75 mg by mouth as needed (migraine). Patient not taking: Reported on 12/24/2022 10/23/22   [provider]  promethazine (PHENERGAN) 25 MG suppository Place 1 suppository (25 mg total) rectally every 6 (six) hours as needed for nausea or vomiting. 01/21/23   Eber Hong, MD  sulfamethoxazole-trimethoprim (BACTRIM DS) 800-160 MG tablet Take 1 tablet by mouth 2 (two) times daily. X 7 days Patient not taking: Reported on 12/24/2022  12/09/22   Cheral Marker, CNM  tiZANidine (ZANAFLEX) 4 MG tablet Take 4 mg by mouth at bedtime. 10/23/22   [provider]  traZODone (DESYREL) 50 MG tablet Take 100 mg by mouth at bedtime as needed. 01/21/23   [provider]  VRAYLAR 1.5 MG capsule Take 1.5 mg by mouth daily. 09/27/22   [provider]      Allergies    Tape, Buspirone, Fluoxetine, and Nsaids    Review of Systems   Review of Systems  Gastrointestinal:  Positive for abdominal pain.    Physical Exam Updated Vital Signs BP 116/70 (BP Location: Right Arm)   Pulse 78   Temp 97.8 F (36.6 C) (Oral)   Resp 20   Ht 5\' 8"  (1.727 m)   Wt 78.9 kg   LMP 12/30/2022   SpO2 97%   BMI 26.45 kg/m  Physical Exam Vitals and nursing note reviewed.  Constitutional:      General: She is not in acute distress.    Appearance: She is well-developed.  HENT:     Head: Normocephalic and atraumatic.  Eyes:     Conjunctiva/sclera: Conjunctivae normal.  Cardiovascular:     Rate and Rhythm: Normal rate and regular rhythm.     Heart sounds: No murmur heard. Pulmonary:     Effort: Pulmonary effort is normal. No respiratory distress.     Breath sounds: Normal breath sounds.  Abdominal:     General: Abdomen is flat.     Palpations: Abdomen is soft.     Tenderness: There is abdominal tenderness in the epigastric area. There is no right CVA tenderness, left CVA tenderness, guarding or rebound.  Musculoskeletal:        General: No swelling.     Cervical back: Neck supple.  Skin:    General: Skin is warm and dry.     Capillary Refill: Capillary refill takes less than 2 seconds.  Neurological:     Mental Status: She is alert.  Psychiatric:        Mood and Affect: Mood normal.     ED Results / Procedures / Treatments   Labs (all labs ordered are listed, but only abnormal results are displayed) Labs Reviewed  COMPREHENSIVE METABOLIC PANEL - Abnormal; Notable for the following components:      Result  Value   Potassium 3.3 (*)    All other components within normal limits  CBC - Abnormal; Notable for the following components:   WBC 13.7 (*)    All other components within normal limits  URINALYSIS, ROUTINE W REFLEX MICROSCOPIC - Abnormal; Notable for the following components:   APPearance HAZY (*)    Hgb urine dipstick MODERATE (*)    Protein, ur 30 (*)    Leukocytes,Ua LARGE (*)    Bacteria, UA FEW (*)    All other components within normal limits  URINE CULTURE  LIPASE, BLOOD  POC URINE PREG,  ED    EKG None  Radiology No results found.  Procedures Procedures    Medications Ordered in ED Medications  droperidol (INAPSINE) 2.5 MG/ML injection 2.5 mg (2.5 mg Intravenous Given 01/31/23 1743)  famotidine (PEPCID) IVPB 20 mg premix (0 mg Intravenous Stopped 01/31/23 1848)  lactated ringers bolus 1,000 mL (0 mLs Intravenous Stopped 01/31/23 1900)  cephALEXin (KEFLEX) capsule 500 mg (500 mg Oral Given 01/31/23 1948)  ondansetron (ZOFRAN) injection 4 mg (4 mg Intravenous Given 01/31/23 1946)  potassium chloride SA (KLOR-CON M) CR tablet 20 mEq (20 mEq Oral Given 01/31/23 1948)  alum & mag hydroxide-simeth (MAALOX/MYLANTA) 200-200-20 MG/5ML suspension 30 mL (30 mLs Oral Given 01/31/23 1946)    ED Course/ Medical Decision Making/ A&P                             Medical Decision Making This patient presents to the ED for concern of jaw pain nausea and vomiting, this involves an extensive number of treatment options, and is a complaint that carries with it a high risk of complications and morbidity.  The differential diagnosis includes peptic ulcer disease, gastritis, pancreatitis, cholecystitis, cannabis hyperemesis, ectopic pregnancy, ovarian torsion, appendicitis, gastroenteritis, other   Co morbidities that complicate the patient evaluation  Cannabis abuse, peptic ulcer disease   Additional history obtained:  Additional history obtained from EMR External records from outside  source obtained and reviewed including several recent ED visits, prior endoscopy results, urine culture results   Lab Tests:  I Ordered, and personally interpreted labs.  The pertinent results include:  UA shows large leuks, 21-50 WBC, >50 wbc, and few bacteria consistent with UTI  CBC shows leukocytosis 13.7  CMP shows mild hypokalemia    Problem List / ED Course / Critical interventions / Medication management  Patient presents today for continued epigastric pain and burning with nausea and vomiting.  Is been going on for a week, but the longer the left more intermittently.  She had the same pain on 01/13/23 with no acute findings and on 12/16/2022 as well.  She states this feels like the pain she had when she had ulcers before.  She has a GI appointment set up for Monday but was not keeping anything down at home.  She also reports she has been diagnosed couple times with UTI but has not been to pick up her antibiotics as this can be $27 and she did not have the money at the time.  Still having some mild dysuria.  No fevers or chills, no flank pain. She has been told this could be due to cannabis use so has not smoked in several days.  Discussed with her between peptic ulcer disease and cannabis hyperemesis, these may both be contributing to her symptoms.  Discussed that sometimes the hyperemesis can take a while couple of weeks to resolve and encouraged to continue with her abstinence.  Encouraged to follow-up with GI as well.  She is already taking Carafate.  She was provided with a new prescription for the Keflex.  Discussed use of good Rx coupon to help with cost.  She states she has this available to her. Labs did show a mild leukocytosis which may be due to to her vomiting and/or her UTI.  She has been prior provided with prescription for the UTI.  She is not febrile, likely tachycardic in triage but this resolved once she was placed in bed without intervention, has further improved  with IV  fluids.  She is well-appearing.  He verbalized understanding of need to take the antibiotics to improve the infection.  She does not have any flank pain/tenderness or back pain to suggest pyelonephritis.  She does note that hot showers make her pain get much better.  I offered capsaicin cream and she declined. She is given strict return precautions. I ordered medication including droperidol for nausea and abdominal pain Reevaluation of the patient after these medicines showed that the patient improved I have reviewed the patients home medicines and have made adjustments as needed     Test / Admission - Considered:  Considered CT abdomen pelvis but patient has recent imaging, do not feel further radiation is indicated at this time and will be more harmful than beneficial, she has close follow-up with GI scheduled.    Amount and/or Complexity of Data Reviewed Labs: ordered.  Risk OTC drugs. Prescription drug management.           Final Clinical Impression(s) / ED Diagnoses Final diagnoses:  Epigastric pain    Rx / DC Orders ED Discharge Orders          Ordered    pantoprazole (PROTONIX) 40 MG tablet  Daily        01/31/23 1916    cephALEXin (KEFLEX) 500 MG capsule  2 times daily        01/31/23 [address] 01/31/23 Alycia Patten, MD 02/01/23 1150

## 2023-01-31 NOTE — ED Notes (Signed)
Pt upset and wanting to bed admitted or go home   PA notified.  I explained to pt the process

## 2023-02-01 LAB — URINE CULTURE

## 2023-02-02 LAB — URINE CULTURE: Culture: 10000 — AB

## 2023-02-03 ENCOUNTER — Telehealth: Payer: Self-pay | Admitting: *Deleted

## 2023-02-03 ENCOUNTER — Telehealth (HOSPITAL_BASED_OUTPATIENT_CLINIC_OR_DEPARTMENT_OTHER): Payer: Self-pay | Admitting: *Deleted

## 2023-02-03 NOTE — Progress Notes (Signed)
ED Antimicrobial Stewardship Positive Culture Follow Up   Debbie Santana is an 32 y.o. female who presented to [facility] on 01/31/2023 with a chief complaint of abdominal pain/nausea/vomiting. 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 UA was abnormal with pyuria, no evidence of contamination. Patient did report some dysuria. Urine culture grew Citrobacter freundii, prescribed cephalexin.    Recent Results (from the past 720 hour(s))  Urine Culture     Status: Abnormal   Collection Time: 01/31/23  5:46 PM   Specimen: Urine, Clean Catch  Result Value Ref Range Status   Specimen Description   Final    URINE, CLEAN CATCH Performed at [facility], [address], [place], [place] [address]    Special Requests   Final    NONE Performed at [facility], [address] [place], [place] [address]    Culture 10,000 COLONIES/mL CITROBACTER FREUNDII (A)  Final   Report Status 02/02/2023 FINAL  Final   Organism ID, Bacteria CITROBACTER FREUNDII (A)  Final      Susceptibility   Citrobacter freundii - MIC*    CEFEPIME <=0.12 SENSITIVE Sensitive     CEFTRIAXONE <=0.25 SENSITIVE Sensitive     CIPROFLOXACIN <=0.25 SENSITIVE Sensitive     GENTAMICIN <=1 SENSITIVE Sensitive     IMIPENEM 0.5 SENSITIVE Sensitive     NITROFURANTOIN <=16 SENSITIVE Sensitive     TRIMETH/SULFA <=20 SENSITIVE Sensitive     PIP/TAZO <=4 SENSITIVE Sensitive     * 10,000 COLONIES/mL CITROBACTER FREUNDII    [x]  Treated with Cephalexin, organism resistant to prescribed antimicrobial  New antibiotic prescription: Assess patient for symptoms of UTI. If symptoms present, stop cephalexin, start Bactrim DS 1 tablet BID for 3 days.   ED Provider: Jannifer Hick, PA    Jani Gravel, PharmD PGY-2 Infectious Diseases Resident  02/03/2023 8:10 AM

## 2023-02-03 NOTE — Telephone Encounter (Signed)
Post ED Visit - Positive Culture Follow-up: Successful Patient Follow-Up  Culture assessed and recommendations reviewed by:  []  Enzo Bi, Pharm.D. []  Celedonio Miyamoto, Pharm.D., BCPS AQ-ID []  Garvin Fila, Pharm.D., BCPS []  Georgina Pillion, Pharm.D., BCPS []  [place], [address].D., BCPS, AAHIVP []  Estella Husk, Pharm.D., BCPS, AAHIVP []  Lysle Pearl, PharmD, BCPS []  Phillips Climes, PharmD, BCPS []  Agapito Games, PharmD, BCPS []  Verlan Friends, PharmD  Positive urine culture  []  Patient discharged without antimicrobial prescription and treatment is now indicated [x]  Organism is resistant to prescribed ED discharge antimicrobial []  Patient with positive blood cultures  Changes discussed with ED provider: [name] PA  New antibiotic prescription Bactrim  DS 1 tab BID Called to [facility] in  [place]   Contacted patient, date 02/03/23, time 0856 Per Jannifer Hick P.A. Plan to stop taking cephalexin and start bactrim DS 1 tab BID for 3 days. New presciption called into [facility] in [place].    Debbie Santana 02/03/2023, 9:02 AM

## 2023-02-03 NOTE — Transitions of Care (Post Inpatient/ED Visit) (Signed)
   02/03/2023  Name: Debbie Santana MRN: [id number] DOB: August 24, 1991  Today's TOC FU Call Status: Today's TOC FU Call Status:: Unsuccessul Call (1st Attempt) Unsuccessful Call (1st Attempt) Date: 02/03/23  Attempted to reach the patient regarding the most recent Inpatient/ED visit.  Follow Up Plan: Additional outreach attempts will be made to reach the patient to complete the Transitions of Care (Post Inpatient/ED visit) call.   Estanislado Emms RN, BSN [place]  Managed [facility] RN Care Coordinator [phone]

## 2023-02-04 ENCOUNTER — Encounter: Payer: Self-pay | Admitting: Adult Health

## 2023-02-04 ENCOUNTER — Telehealth (INDEPENDENT_AMBULATORY_CARE_PROVIDER_SITE_OTHER): Payer: Self-pay | Admitting: Gastroenterology

## 2023-02-04 ENCOUNTER — Telehealth: Payer: Self-pay | Admitting: *Deleted

## 2023-02-04 ENCOUNTER — Ambulatory Visit (INDEPENDENT_AMBULATORY_CARE_PROVIDER_SITE_OTHER): Payer: Medicaid Other | Admitting: Gastroenterology

## 2023-02-04 ENCOUNTER — Other Ambulatory Visit (HOSPITAL_COMMUNITY)
Admission: RE | Admit: 2023-02-04 | Discharge: 2023-02-04 | Disposition: A | Discharge status: Home / Self Care | Payer: Medicaid Other | Source: Ambulatory Visit | Attending: Adult Health | Admitting: Adult Health

## 2023-02-04 ENCOUNTER — Ambulatory Visit: Payer: Medicaid Other | Admitting: Adult Health

## 2023-02-04 ENCOUNTER — Encounter (INDEPENDENT_AMBULATORY_CARE_PROVIDER_SITE_OTHER): Payer: Self-pay | Admitting: Gastroenterology

## 2023-02-04 VITALS — BP 119/71 | HR 95 | Ht 68.0 in | Wt 180.0 lb

## 2023-02-04 VITALS — BP 114/75 | HR 96 | Temp 98.2°F | Ht 68.0 in | Wt 179.0 lb

## 2023-02-04 DIAGNOSIS — K5900 Constipation, unspecified: Secondary | ICD-10-CM

## 2023-02-04 DIAGNOSIS — R112 Nausea with vomiting, unspecified: Secondary | ICD-10-CM | POA: Diagnosis not present

## 2023-02-04 DIAGNOSIS — R1013 Epigastric pain: Secondary | ICD-10-CM | POA: Diagnosis not present

## 2023-02-04 DIAGNOSIS — K2100 Gastro-esophageal reflux disease with esophagitis, without bleeding: Secondary | ICD-10-CM | POA: Diagnosis not present

## 2023-02-04 DIAGNOSIS — Z113 Encounter for screening for infections with a predominantly sexual mode of transmission: Secondary | ICD-10-CM | POA: Insufficient documentation

## 2023-02-04 DIAGNOSIS — N898 Other specified noninflammatory disorders of vagina: Secondary | ICD-10-CM | POA: Insufficient documentation

## 2023-02-04 DIAGNOSIS — A599 Trichomoniasis, unspecified: Secondary | ICD-10-CM | POA: Diagnosis not present

## 2023-02-04 LAB — POCT WET PREP (WET MOUNT)
Clue Cells Wet Prep Whiff POC: POSITIVE
WBC, Wet Prep HPF POC: POSITIVE

## 2023-02-04 MED ORDER — METRONIDAZOLE 500 MG PO TABS: 500.0000 mg | ORAL_TABLET | Freq: Two times a day (BID) | ORAL | 0 refills | Status: DC

## 2023-02-04 MED ORDER — PROMETHAZINE HCL 25 MG RE SUPP: 25.0000 mg | Freq: Three times a day (TID) | RECTAL | 0 refills | Status: DC | PRN

## 2023-02-04 MED ORDER — ONDANSETRON 8 MG PO TBDP: 8.0000 mg | ORAL_TABLET | Freq: Three times a day (TID) | ORAL | 0 refills | Status: DC | PRN

## 2023-02-04 MED ORDER — PROMETHAZINE HCL 12.5 MG RE SUPP: 12.5000 mg | Freq: Three times a day (TID) | RECTAL | 0 refills | Status: DC | PRN

## 2023-02-04 NOTE — Progress Notes (Signed)
Subjective:     Patient ID: Debbie Santana, female   DOB: 1990/12/15, 32 y.o.   MRN: [id number]  HPI Sakshi is a 32 year old white female,single, G2P1011, in complaining of yellow vaginal discharge with odor, and some abdominal pain. She was treated for UTI last week, was seen in ER. And she requests STD testing. She saw GI today too.     Component Value Date/Time   DIAGPAP (A) 06/13/2022 1453    - Atypical squamous cells of undetermined significance (ASC-US)   DIAGPAP (A) 09/05/2021 1037    - Atypical squamous cells of undetermined significance (ASC-US)   DIAGPAP (A) 10/31/2020 1348    - Atypical squamous cells of undetermined significance (ASC-US)   HPVHIGH Negative 06/13/2022 1453   HPVHIGH Negative 09/05/2021 1037   HPVHIGH Negative 10/31/2020 1348   ADEQPAP  06/13/2022 1453    Satisfactory for evaluation; transformation zone component PRESENT.   ADEQPAP  09/05/2021 1037    Satisfactory for evaluation; transformation zone component PRESENT.   ADEQPAP  10/31/2020 1348    Satisfactory for evaluation; transformation zone component PRESENT.   PCP is Dr Sudie Bailey  Review of Systems +yellow vaginal discharge with odor   +abdominal pain Reviewed past medical,surgical, social and family history. Reviewed medications and allergies.  Objective:   Physical Exam BP 119/71 (BP Location: Left Arm, Patient Position: Sitting, Cuff Size: Normal)   Pulse 95   Ht 5\' 8"  (1.727 m)   Wt 180 lb (81.6 kg)   LMP 01/25/2023   BMI 27.37 kg/m     Skin warm and dry.Pelvic: external genitalia is normal in appearance no lesions, vagina: yellow creamy discharge with odor,urethra has no lesions or masses noted, cervix: bulbous, uterus: normal size, shape and contour, non tender, no masses felt, adnexa: no masses or tenderness noted. Bladder is non tender and no masses felt. Wet prep: + for trich and +WBCs. CV swab obtained.  Upstream - 02/04/23 1130       Pregnancy Intention Screening   Does the  patient want to become pregnant in the next year? No    Does the patient's partner want to become pregnant in the next year? No    Would the patient like to discuss contraceptive options today? No      Contraception Wrap Up   Current Method Hormonal Implant    End Method Hormonal Implant            Examination chaperoned by Malachy Mood LPN  Assessment:     1. Vaginal discharge +yellow discharge  CV swab sent  - Cervicovaginal ancillary only( [place]) - POCT Wet Prep ([place]) Wet prep +trich  2. Vaginal odor +odor CV swab sent  - Cervicovaginal ancillary only( [place]) - POCT Wet Prep [facility]) +trich   3. Screening examination for STD (sexually transmitted disease) CV swab sent for GC/CHL,trich,BV and yeast  - Cervicovaginal ancillary only( ) She declines HIV or RPR   4. Trichomoniasis +trich on wet prep Will rx flagyl for pt and partner(gave her rx for [facility], flagyl 500 mg #4 4 po now no ETOH Meds ordered this encounter  Medications   metroNIDAZOLE (FLAGYL) 500 MG tablet    Sig: Take 1 tablet (500 mg total) by mouth 2 (two) times daily.    Dispense:  14 tablet    Refill:  0    Order Specific Question:   Supervising Provider    Answer:   Duane Lope H [[id number]]  No sex or alcohol while taking Gave handout on trich - [employer])     Plan:     Follow up prn Can do self swab in 2 weeks for proof of cure

## 2023-02-04 NOTE — Telephone Encounter (Signed)
PA pending via [employer] for EGD

## 2023-02-04 NOTE — Transitions of Care (Post Inpatient/ED Visit) (Signed)
   02/04/2023  Name: Debbie Santana MRN: [id number] DOB: [birth year]  Today's TOC FU Call Status: Today's TOC FU Call Status:: Unsuccessful Call (2nd Attempt) Unsuccessful Call (2nd Attempt) Date: 02/04/23  Attempted to reach the patient regarding the most recent Inpatient/ED visit.  Follow Up Plan: Additional outreach attempts will be made to reach the patient to complete the Transitions of Care (Post Inpatient/ED visit) call.   Estanislado Emms RN, BSN [place]  Managed [facility] RN Care Coordinator [phone]

## 2023-02-04 NOTE — Patient Instructions (Addendum)
Continue with protonix 40mg  daily   Continue carafate 1g 4x/day Can use Bentyl 20mg  up to twice daily for abdominal pain  Will provide linzess samples for constipation, try to ensure good water intake, aim for 64 oz per day I have sent a refill of zofran 8mg  to be used up to every 8 hours for nausea and promethazine 12.5 mg suppository to be used for severe nausea  We will schedule EGD for further evaluation of your symptoms Please continue to abstain from all marijuana use  Follow up 3 months

## 2023-02-04 NOTE — H&P (View-Only) (Signed)
 Referring Provider: Knowlton, Stephen, MD Primary Care Physician:  Knowlton, Stephen, MD Primary GI Physician: Dr. Castaneda   Chief Complaint  Patient presents with   Abdominal Pain    ED follow up on abdominal pain, nausea and trouble eating. Wonders if she has an ulcer. Would like a refill on zofran 8 mg odt, pantoprazole, and dicyclomine. Has ran out of phenergan supp and requesting a refill on that also.    HPI:   Debbie Santana is a 31 y.o. female with past medical history of anxiety, depression, GERD, PUD, erosive esophagitis, cannabis hyperemesis syndrome.   Patient presenting today for hospital follow up  Last seen August 2023, at that time taking linzess and had some improvement in constipation. No longer taking vicodin, continued with epigastric pain, daily. Not worse with eating. Having daily nausea and some vomiting. Taking PPI daily and carafate 2-3x/day. Did not get levsin as previously prescribed due to insurance issues. Notably H pylori stain was positive on EGD in March 2023, patient treated with pylera repeat breath test in September 2023 was negative.   Recommended to continue with PPI BID, carafate TID, H Pylori breath test, Rx Levsin, schedule CT A/P with contrast.  Multiple recent ED visits (8 since march ) for nausea, vomiting and epigastric pain, labs mostly unremarkable other than potassium 3.3, WBC 13.7, UTI. Suspected cannabis hyperemesis vs PUD contributing to her symptoms, given haldol during ED visits with improvement in symptoms.   Present:  Patient reports worsening epigastric pain, nausea and vomiting. Felt symptoms started around 5/11. Symptoms have calmed down some since last ED visit. She notes that she had ran out of her PPI, anti emetics, dicyclomine and only had her carafate to take as she ran out of her medications after she missed her appt here to get refills. Eating sometimes makes symptoms better but sometimes makes it worse, depending on what she  eats. If she eats more bland, softer foods this helps. She denies any  heartburn or acid regurgitation but stomach is burning and she gets some sharp pain that radiates into her back. She is having some constipation. No rectal bleeding or melena. She is taking stool softeners for her constipation which helps some. She does mag citrate when constipation gets really bad. Unsure how often she is having a BM. Did have great results on linzess samples but ran out of this. She is back on opiate pain medication for chronic back pain. Taking this twice a day. She notes some improvement In symptoms with restarting her bentyl, zofran and protonix 40mg daily that was refilled in the ED. No NSAIDs, no ETOH, she notes she was smoking delta 9 previously but when she was told it could be contributing to her symptoms she stopped, last use was about 6 days ago. Has lost about 7 pounds since April, noting some early satiety at times, feels similar to when she had H pylori in the past     Last CT A/P with contrast: 01/13/23 No acute findings in the abdomen or pelvis. 3.4 cm functional cyst in the right ovary. Last EGD 11/21/21 duodenal bulb and second portion of the duodenum were normal. Outpatient - Normal hypopharynx. - Normal esophagus. - Z-line irregular, 37 cm from the incisors. - Erosive gastropathy with no stigmata of recent bleeding. Biopsied. ( H pylori +, treated with Pylera) - Normal duodenal bulb and second portion of the duodenum. Last Colonoscopy: 08/29/2020  - normal TI, normal colon neg for microscopic colitis, 2 mm   polyp in sigmoid (lymphoid aggregate) and 8 mm polyp in rectum (condyloma accuminatum).   Past Medical History:  Diagnosis Date   Anxiety    Bradycardia 04/17/2017   Chest pain on breathing 02/22/2020   Chronic back pain    Depression    Erosive gastritis 04/11/2017   Gastric ulcer    Genital warts    GERD (gastroesophageal reflux disease)    High cholesterol    History of gestational  hypertension 05/21/2018   Dx intrapartum   History of kidney stones    Intertrigo 11/16/2019   Intractable abdominal pain 04/11/2017   Intractable nausea and vomiting 12/07/2018   Intractable vomiting 05/20/2020   Irregular intermenstrual bleeding 02/19/2019   Mental disorder    Migraine without aura and without status migrainosus, not intractable 02/19/2019   Nausea 05/10/2020   Nausea & vomiting 12/07/2018   Nexplanon in place 02/19/2019   Obesity 04/17/2017   Panic disorder    Peptic ulcer disease 04/13/2017   Pinched nerve 09/24/2019   Postpartum hypertension 06/23/2018   requiring norvasc, resolved at pp visit   Sciatica    Tremor of both hands    Vaginal Pap smear, abnormal    Wheezing     Past Surgical History:  Procedure Laterality Date   BACK SURGERY     ruptured disc.   BACK SURGERY  11/2021   BIOPSY  04/14/2017   Procedure: BIOPSY;  Surgeon: Rehman, Najeeb U, MD;  [facility]: [facility];  Service: Endoscopy;;  gastric   BIOPSY  05/22/2020   Procedure: BIOPSY;  Surgeon: Rourk, Robert M, MD;  [facility]: [facility];  Service: Endoscopy;;   BIOPSY  08/29/2020   Procedure: BIOPSY;  Surgeon: Castaneda Mayorga, Daniel, MD;  Location: [facility];  Service: Gastroenterology;;   BIOPSY  09/09/2020   Procedure: BIOPSY;  Surgeon: Carver, Charles K, DO;  [facility]: [facility];  Service: Endoscopy;;  duodenum;antral   BIOPSY  11/21/2021   Procedure: BIOPSY;  Surgeon: Rehman, Najeeb U, MD;  [facility]: [facility];  Service: Endoscopy;;   CERVICAL ABLATION N/A 12/02/2018   Procedure: LASER ABLATION OF CERVIX;  Surgeon: Eure, Luther H, MD;  Location: [facility];  Service: Gynecology;  Laterality: N/A;   CHOLECYSTECTOMY N/A 04/17/2017   Procedure: LAPAROSCOPIC CHOLECYSTECTOMY;  Surgeon: Jenkins, Mark, MD;  Location: [facility];  Service: General;  Laterality: N/A;   COLONOSCOPY WITH PROPOFOL N/A 08/29/2020   Procedure: COLONOSCOPY WITH PROPOFOL;  Surgeon: Castaneda Mayorga,  Daniel, MD;  [facility]: [facility];  Service: Gastroenterology;  Laterality: N/A;  11:15   COLPOSCOPY     ESOPHAGOGASTRODUODENOSCOPY (EGD) WITH PROPOFOL N/A 04/14/2017   Procedure: ESOPHAGOGASTRODUODENOSCOPY (EGD) WITH PROPOFOL;  Surgeon: Rehman, Najeeb U, MD;  [facility];  Service: Endoscopy;  Laterality: N/A;   ESOPHAGOGASTRODUODENOSCOPY (EGD) WITH PROPOFOL N/A 05/22/2020   Procedure: ESOPHAGOGASTRODUODENOSCOPY (EGD) WITH PROPOFOL;  Surgeon: Rourk, Robert M, MD;  [facility];  Service: Endoscopy;  Laterality: N/A;   ESOPHAGOGASTRODUODENOSCOPY (EGD) WITH PROPOFOL N/A 09/09/2020   Procedure: ESOPHAGOGASTRODUODENOSCOPY (EGD) WITH PROPOFOL;  Surgeon: Carver, Charles K, DO;  [facility];  Service: Endoscopy;  Laterality: N/A;   ESOPHAGOGASTRODUODENOSCOPY (EGD) WITH PROPOFOL N/A 11/21/2020   Procedure: ESOPHAGOGASTRODUODENOSCOPY (EGD) WITH PROPOFOL;  Surgeon: Castaneda Mayorga, Daniel, MD;  [facility];  Service: Gastroenterology;  Laterality: N/A;  Am   ESOPHAGOGASTRODUODENOSCOPY (EGD) WITH PROPOFOL N/A 11/21/2021   Procedure: ESOPHAGOGASTRODUODENOSCOPY (EGD) WITH PROPOFOL;  Surgeon: Rehman, Najeeb U, MD;  [facility];  Service: Endoscopy;  Laterality: N/A;  820   HERNIA REPAIR Bilateral    inguinal   LASER ABLATION CONDOLAMATA N/A 12/02/2018   Procedure: LASER ABLATION CONDYLOMA ACCUMINATA LEFT AND RIGHT VULVA, PERINEUM AND PERIANAL (15 TOTAL);  Surgeon: Eure, Luther H, MD;  Location: [facility];  Service: Gynecology;  Laterality: N/A;   LUMBAR LAMINECTOMY/DECOMPRESSION MICRODISCECTOMY Right 12/03/2021   Procedure: MICRODISCECTOMY Lumbar four- five;  Surgeon: Jenkins, Jeffrey, MD;  Location: [facility];  Service: Neurosurgery;  Laterality: Right;   MOUTH SURGERY  09/2021   All top teeth and all wisdom teeth removed   MOUTH SURGERY  12/2021   POLYPECTOMY  08/29/2020   Procedure: POLYPECTOMY;  Surgeon: Castaneda Mayorga, Daniel, MD;  Location:  [facility];  Service: Gastroenterology;;    Current Outpatient Medications  Medication Sig Dispense Refill   albuterol (VENTOLIN HFA) 108 (90 Base) MCG/ACT inhaler Inhale 2 puffs into the lungs every 6 (six) hours as needed for wheezing or shortness of breath. 1 each 0   butalbital-acetaminophen-caffeine (FIORICET) 50-325-40 MG tablet TAKE 1 TABLET BY MOUTH EVERY 6 HOURS AS NEEDED FOR MIGRAINE HEADACHE 20 tablet 0   clonazePAM (KLONOPIN) 1 MG tablet Take 1 mg by mouth 4 (four) times daily as needed.     dicyclomine (BENTYL) 20 MG tablet Take 1 tablet (20 mg total) by mouth 2 (two) times daily. 60 tablet 0   diphenhydrAMINE (BENADRYL) 25 MG tablet Take 25 mg by mouth every 6 (six) hours as needed for allergies.     etonogestrel (NEXPLANON) 68 MG IMPL implant 68 mg by Subdermal route once.     hyoscyamine (LEVSIN SL) 0.125 MG SL tablet Place 1 tablet (0.125 mg total) under the tongue every 6 (six) hours as needed. 30 tablet 1   naloxone (NARCAN) 2 MG/2ML injection Spray one-half (1 ml) of syringe into each nostril upon signs of opioid overdose. Call 911. May repeat in 3-5 minutes as needed, as directed     ondansetron (ZOFRAN-ODT) 8 MG disintegrating tablet 8mg ODT q4 hours prn nausea 10 tablet 0   oxyCODONE-acetaminophen (PERCOCET) 10-325 MG tablet Take 1 tablet by mouth See admin instructions. Take 1 tablet 1-4 times daily     pantoprazole (PROTONIX) 40 MG tablet Take 1 tablet (40 mg total) by mouth daily. 30 tablet 0   sertraline (ZOLOFT) 50 MG tablet Take 200 mg by mouth daily.     SPIRIVA RESPIMAT 2.5 MCG/ACT AERS Inhale 2 each into the lungs daily.     sucralfate (CARAFATE) 1 g tablet Take 1 tablet (1 g total) by mouth 3 (three) times daily before meals. 90 tablet 3   sulfamethoxazole-trimethoprim (BACTRIM DS) 800-160 MG tablet Take 1 tablet by mouth 2 (two) times daily.     tiZANidine (ZANAFLEX) 4 MG capsule SMARTSIG:1-2 Capsule(s) By Mouth Every 6-8 Hours PRN     tiZANidine (ZANAFLEX) 4  MG tablet Take 4 mg by mouth at bedtime.     traZODone (DESYREL) 50 MG tablet Take 100 mg by mouth at bedtime as needed.     VRAYLAR 1.5 MG capsule Take 1.5 mg by mouth daily.     propranolol (INDERAL) 10 MG tablet Take 1 tablet (10 mg total) by mouth 2 (two) times daily. (Patient not taking: Reported on 02/04/2023) 60 tablet 6   No current facility-administered medications for this visit.    Allergies as of 02/04/2023 - Review Complete 02/04/2023  Allergen Reaction Noted   Tape Other (See Comments) 12/17/2022   Buspirone    09/24/2022   Fluoxetine  09/24/2022   Nsaids Other (See Comments) 08/22/2020    Family History  Problem Relation Age of Onset   Anxiety disorder Mother    Hyperlipidemia Mother    Crohn's disease Sister    Diabetes Maternal Grandfather    Diabetes Cousin    Learning disabilities Cousin     Social History   Socioeconomic History   Marital status: Single    Spouse name: Not on file   Number of children: 1   Years of education: Not on file   Highest education level: Not on file  Occupational History   Not on file  Tobacco Use   Smoking status: Every Day    Packs/day: 1.00    Years: 10.00    Additional pack years: 0.00    Total pack years: 10.00    Types: Cigarettes    Passive exposure: Current   Smokeless tobacco: Never  Vaping Use   Vaping Use: Former  Substance and Sexual Activity   Alcohol use: Not Currently   Drug use: Yes    Comment: delta 9- quit   Sexual activity: Yes    Birth control/protection: Implant  Other Topics Concern   Not on file  Social History Narrative   Lives with son-2 years old- Eli (lives with mom and dad)   Father around some.      3 dogs and 3 cats      Enjoy: crafts      Diet: eats all food groups -acidic foods   Caffeine: stopped in last week or so   Water: 6-8 cups daily       Wears seat belt   Does not drive   Smoke detectors at home   Fire extinguisher    No Weapons    Social Determinants of Health    Financial Resource Strain: Low Risk  (06/13/2022)   Overall Financial Resource Strain (CARDIA)    Difficulty of Paying Living Expenses: Not very hard  Food Insecurity: Food Insecurity Present (06/13/2022)   Hunger Vital Sign    Worried About Running Out of Food in the Last Year: Sometimes true    Ran Out of Food in the Last Year: Sometimes true  Transportation Needs: No Transportation Needs (06/13/2022)   PRAPARE - Transportation    Lack of Transportation (Medical): No    Lack of Transportation (Non-Medical): No  Physical Activity: Insufficiently Active (06/13/2022)   Exercise Vital Sign    Days of Exercise per Week: 2 days    Minutes of Exercise per Session: 50 min  Stress: Stress Concern Present (06/13/2022)   [employer] - Occupational Stress Questionnaire    Feeling of Stress : Rather much  Social Connections: Socially Isolated (06/13/2022)   Social Connection and Isolation Panel [NHANES]    Frequency of Communication with Friends and Family: Once a week    Frequency of Social Gatherings with Friends and Family: Never    Attends Religious Services: Never    Active Member of Clubs or Organizations: No    Attends Club or Organization Meetings: Never    Marital Status: Never married    Review of systems General: negative for malaise, night sweats, fever, chills, +weight loss  Neck: Negative for lumps, goiter, pain and significant neck swelling Resp: Negative for cough, wheezing, dyspnea at rest CV: Negative for chest pain, leg swelling, palpitations, orthopnea GI: denies melena, hematochezia, diarrhea, dysphagia, odynophagia +nausea +vomiting +early satiety +weight loss +epigastric pain  MSK: Negative for   joint pain or swelling, back pain, and muscle pain. Derm: Negative for itching or rash Psych: Denies depression, anxiety, memory loss, confusion. No homicidal or suicidal ideation.  Heme: Negative for prolonged bleeding, bruising easily, and swollen  nodes. Endocrine: Negative for cold or heat intolerance, polyuria, polydipsia and goiter. Neuro: negative for tremor, gait imbalance, syncope and seizures. The remainder of the review of systems is noncontributory.  Physical Exam: BP 114/75 (BP Location: Left Arm, Patient Position: Sitting, Cuff Size: Normal)   Pulse 96   Temp 98.2 F (36.8 C) (Oral)   Ht 5' 8" (1.727 m)   Wt 179 lb (81.2 kg)   LMP 12/30/2022   BMI 27.22 kg/m  General:   Alert and oriented. No distress noted. Pleasant and cooperative.  Head:  Normocephalic and atraumatic. Eyes:  Conjuctiva clear without scleral icterus. Mouth:  Oral mucosa pink and moist. Good dentition. No lesions. Heart: Normal rate and rhythm, s1 and s2 heart sounds present.  Lungs: Clear lung sounds in all lobes. Respirations equal and unlabored. Abdomen:  +BS, soft, and non-distended. Mild TTP of epigastric region. No rebound or guarding. No HSM or masses noted. Derm: No palmar erythema or jaundice Msk:  Symmetrical without gross deformities. Normal posture. Extremities:  Without edema. Neurologic:  Alert and  oriented x4 Psych:  Alert and cooperative. Normal mood and affect.  Invalid input(s): "6 MONTHS"   ASSESSMENT: Miamarie L Santana is a 31 y.o. female presenting today for epigastric pain, nausea, vomiting.  History of H pylori, s/p treatment with Pylera last March, confirmed eradication via breath testing in September 2023. She has had multiple ED visits since March for epigastric pain, nausea and vomiting. Notes some improvement in symptoms when she got back on her PPI, Bentyl and anti emetics though symptoms have not resolved. she does report smoking delta 9 up until about 6 days ago when she was told in the ED this may be contributing, notably Haldol improved her symptoms in the ED. This certainly raises questions if symptoms are related to cannabis hyperemesis, however, given her continued symptoms and history of h pylori, would recommend  proceeding with EGD for further evaluation. She should continue to abstain from all marijuana use, continue PPI daily, carafate 1g QID, will refill anti emetics for PRN use and Dicyclomine. Indications, risks and benefits of procedure discussed in detail with patient. Patient verbalized understanding and is in agreement to proceed with EGD.  History of constipation, likely secondary to chronic opiate pain medication use, has had good results with linzess 72mcg in the past, will provide samples to her today. She will make me aware if this is not keeping her constipation well managed. Encouraged to increase water intake, aim for 64 oz per day.     PLAN:  Continue with protonix 40mg daily   2. Continue carafate 1g QID  3. Rx Bentyl 20mg BID PRN 4. Linzess 72mcg samples  5. Rx Zofran 8mcg Q8h PRN 6. Rx Promethazine 12.5 mg suppository q8H PRN for severe nausea  7. Schedule EGD ASA II   All questions were answered, patient verbalized understanding and is in agreement with plan as outlined above.   Follow Up: 3 months    Qusay Villada L. Xcaret Morad, MSN, APRN, AGNP-C Adult-Gerontology Nurse Practitioner [facility]   I have reviewed the note and agree with the APP's assessment as described in this progress note  If negative biopsies for H. pylori, will need to proceed with H. pylori stool testing to confirm   eradication.  Daniel Castaneda, MD Gastroenterology and Hepatology [facility]  

## 2023-02-04 NOTE — Progress Notes (Addendum)
Referring Provider: John Giovanni, MD Primary Care Physician:  John Giovanni, MD Primary GI Physician: Dr. Levon Hedger   Chief Complaint  Patient presents with   Abdominal Pain    ED follow up on abdominal pain, nausea and trouble eating. Wonders if she has an ulcer. Would like a refill on zofran 8 mg odt, pantoprazole, and dicyclomine. Has ran out of phenergan supp and requesting a refill on that also.    HPI:   Debbie Santana is a 32 y.o. female with past medical history of anxiety, depression, GERD, PUD, erosive esophagitis, cannabis hyperemesis syndrome.   Patient presenting today for hospital follow up  Last seen August 2023, at that time taking linzess and had some improvement in constipation. No longer taking vicodin, continued with epigastric pain, daily. Not worse with eating. Having daily nausea and some vomiting. Taking PPI daily and carafate 2-3x/day. Did not get levsin as previously prescribed due to insurance issues. Notably H pylori stain was positive on EGD in March 2023, patient treated with pylera repeat breath test in September 2023 was negative.   Recommended to continue with PPI BID, carafate TID, H Pylori breath test, Rx Levsin, schedule CT A/P with contrast.  Multiple recent ED visits (8 since march ) for nausea, vomiting and epigastric pain, labs mostly unremarkable other than potassium 3.3, WBC 13.7, UTI. Suspected cannabis hyperemesis vs PUD contributing to her symptoms, given haldol during ED visits with improvement in symptoms.   Present:  Patient reports worsening epigastric pain, nausea and vomiting. Felt symptoms started around 5/11. Symptoms have calmed down some since last ED visit. She notes that she had ran out of her PPI, anti emetics, dicyclomine and only had her carafate to take as she ran out of her medications after she missed her appt here to get refills. Eating sometimes makes symptoms better but sometimes makes it worse, depending on what she  eats. If she eats more bland, softer foods this helps. She denies any  heartburn or acid regurgitation but stomach is burning and she gets some sharp pain that radiates into her back. She is having some constipation. No rectal bleeding or melena. She is taking stool softeners for her constipation which helps some. She does mag citrate when constipation gets really bad. Unsure how often she is having a BM. Did have great results on linzess samples but ran out of this. She is back on opiate pain medication for chronic back pain. Taking this twice a day. She notes some improvement In symptoms with restarting her bentyl, zofran and protonix 40mg  daily that was refilled in the ED. No NSAIDs, no ETOH, she notes she was smoking delta 9 previously but when she was told it could be contributing to her symptoms she stopped, last use was about 6 days ago. Has lost about 7 pounds since April, noting some early satiety at times, feels similar to when she had H pylori in the past     Last CT A/P with contrast: 01/13/23 No acute findings in the abdomen or pelvis. 3.4 cm functional cyst in the right ovary. Last EGD 11/21/21 duodenal bulb and second portion of the duodenum were normal. Outpatient - Normal hypopharynx. - Normal esophagus. - Z-line irregular, 37 cm from the incisors. - Erosive gastropathy with no stigmata of recent bleeding. Biopsied. ( H pylori +, treated with Pylera) - Normal duodenal bulb and second portion of the duodenum. Last Colonoscopy: 08/29/2020  - normal TI, normal colon neg for microscopic colitis, 2 mm  polyp in sigmoid (lymphoid aggregate) and 8 mm polyp in rectum (condyloma accuminatum).   Past Medical History:  Diagnosis Date   Anxiety    Bradycardia 04/17/2017   Chest pain on breathing 02/22/2020   Chronic back pain    Depression    Erosive gastritis 04/11/2017   Gastric ulcer    Genital warts    GERD (gastroesophageal reflux disease)    High cholesterol    History of gestational  hypertension 05/21/2018   Dx intrapartum   History of kidney stones    Intertrigo 11/16/2019   Intractable abdominal pain 04/11/2017   Intractable nausea and vomiting 12/07/2018   Intractable vomiting 05/20/2020   Irregular intermenstrual bleeding 02/19/2019   Mental disorder    Migraine without aura and without status migrainosus, not intractable 02/19/2019   Nausea 05/10/2020   Nausea & vomiting 12/07/2018   Nexplanon in place 02/19/2019   Obesity 04/17/2017   Panic disorder    Peptic ulcer disease 04/13/2017   Pinched nerve 09/24/2019   Postpartum hypertension 06/23/2018   requiring norvasc, resolved at pp visit   Sciatica    Tremor of both hands    Vaginal Pap smear, abnormal    Wheezing     Past Surgical History:  Procedure Laterality Date   BACK SURGERY     ruptured disc.   BACK SURGERY  11/2021   BIOPSY  04/14/2017   Procedure: BIOPSY;  Surgeon: Malissa Hippo, MD;  [facility];  Service: Endoscopy;;  gastric   BIOPSY  05/22/2020   Procedure: BIOPSY;  Surgeon: Corbin Ade, MD;  [facility]: [facility];  Service: Endoscopy;;   BIOPSY  08/29/2020   Procedure: BIOPSY;  Surgeon: Dolores Frame, MD;  [facility]: [facility];  Service: Gastroenterology;;   BIOPSY  09/09/2020   Procedure: BIOPSY;  Surgeon: Lanelle Bal, DO;  [facility]: [facility];  Service: Endoscopy;;  duodenum;antral   BIOPSY  11/21/2021   Procedure: BIOPSY;  Surgeon: Malissa Hippo, MD;  [facility]: [facility];  Service: Endoscopy;;   CERVICAL ABLATION N/A 12/02/2018   Procedure: LASER ABLATION OF CERVIX;  Surgeon: Lazaro Arms, MD;  Location: [facility];  Service: Gynecology;  Laterality: N/A;   CHOLECYSTECTOMY N/A 04/17/2017   Procedure: LAPAROSCOPIC CHOLECYSTECTOMY;  Surgeon: Franky Macho, MD;  Location: [facility];  Service: General;  Laterality: N/A;   COLONOSCOPY WITH PROPOFOL N/A 08/29/2020   Procedure: COLONOSCOPY WITH PROPOFOL;  Surgeon: Dolores Frame, MD;  [facility];  Service: Gastroenterology;  Laterality: N/A;  11:15   COLPOSCOPY     ESOPHAGOGASTRODUODENOSCOPY (EGD) WITH PROPOFOL N/A 04/14/2017   Procedure: ESOPHAGOGASTRODUODENOSCOPY (EGD) WITH PROPOFOL;  Surgeon: Malissa Hippo, MD;  [facility];  Service: Endoscopy;  Laterality: N/A;   ESOPHAGOGASTRODUODENOSCOPY (EGD) WITH PROPOFOL N/A 05/22/2020   Procedure: ESOPHAGOGASTRODUODENOSCOPY (EGD) WITH PROPOFOL;  Surgeon: Corbin Ade, MD;  [facility];  Service: Endoscopy;  Laterality: N/A;   ESOPHAGOGASTRODUODENOSCOPY (EGD) WITH PROPOFOL N/A 09/09/2020   Procedure: ESOPHAGOGASTRODUODENOSCOPY (EGD) WITH PROPOFOL;  Surgeon: Lanelle Bal, DO;  [facility];  Service: Endoscopy;  Laterality: N/A;   ESOPHAGOGASTRODUODENOSCOPY (EGD) WITH PROPOFOL N/A 11/21/2020   Procedure: ESOPHAGOGASTRODUODENOSCOPY (EGD) WITH PROPOFOL;  Surgeon: Dolores Frame, MD;  [facility];  Service: Gastroenterology;  Laterality: N/A;  Am   ESOPHAGOGASTRODUODENOSCOPY (EGD) WITH PROPOFOL N/A 11/21/2021   Procedure: ESOPHAGOGASTRODUODENOSCOPY (EGD) WITH PROPOFOL;  Surgeon: Malissa Hippo, MD;  [facility];  Service: Endoscopy;  Laterality: N/A;  820   HERNIA REPAIR Bilateral    inguinal   LASER ABLATION CONDOLAMATA N/A 12/02/2018   Procedure: LASER ABLATION CONDYLOMA ACCUMINATA LEFT AND RIGHT VULVA, PERINEUM AND PERIANAL (15 TOTAL);  Surgeon: Lazaro Arms, MD;  Location: [facility];  Service: Gynecology;  Laterality: N/A;   LUMBAR LAMINECTOMY/DECOMPRESSION MICRODISCECTOMY Right 12/03/2021   Procedure: MICRODISCECTOMY Lumbar four- five;  Surgeon: Tressie Stalker, MD;  [facility];  Service: Neurosurgery;  Laterality: Right;   MOUTH SURGERY  09/2021   All top teeth and all wisdom teeth removed   MOUTH SURGERY  12/2021   POLYPECTOMY  08/29/2020   Procedure: POLYPECTOMY;  Surgeon: Marguerita Merles, Reuel Boom, MD;  [facility];  Service: Gastroenterology;;    Current Outpatient Medications  Medication Sig Dispense Refill   albuterol (VENTOLIN HFA) 108 (90 Base) MCG/ACT inhaler Inhale 2 puffs into the lungs every 6 (six) hours as needed for wheezing or shortness of breath. 1 each 0   butalbital-acetaminophen-caffeine (FIORICET) 50-325-40 MG tablet TAKE 1 TABLET BY MOUTH EVERY 6 HOURS AS NEEDED FOR MIGRAINE HEADACHE 20 tablet 0   clonazePAM (KLONOPIN) 1 MG tablet Take 1 mg by mouth 4 (four) times daily as needed.     dicyclomine (BENTYL) 20 MG tablet Take 1 tablet (20 mg total) by mouth 2 (two) times daily. 60 tablet 0   diphenhydrAMINE (BENADRYL) 25 MG tablet Take 25 mg by mouth every 6 (six) hours as needed for allergies.     etonogestrel (NEXPLANON) 68 MG IMPL implant 68 mg by Subdermal route once.     hyoscyamine (LEVSIN SL) 0.125 MG SL tablet Place 1 tablet (0.125 mg total) under the tongue every 6 (six) hours as needed. 30 tablet 1   naloxone (NARCAN) 2 MG/2ML injection Spray one-half (1 ml) of syringe into each nostril upon signs of opioid overdose. Call 911. May repeat in 3-5 minutes as needed, as directed     ondansetron (ZOFRAN-ODT) 8 MG disintegrating tablet 8mg  ODT q4 hours prn nausea 10 tablet 0   oxyCODONE-acetaminophen (PERCOCET) 10-325 MG tablet Take 1 tablet by mouth See admin instructions. Take 1 tablet 1-4 times daily     pantoprazole (PROTONIX) 40 MG tablet Take 1 tablet (40 mg total) by mouth daily. 30 tablet 0   sertraline (ZOLOFT) 50 MG tablet Take 200 mg by mouth daily.     SPIRIVA RESPIMAT 2.5 MCG/ACT AERS Inhale 2 each into the lungs daily.     sucralfate (CARAFATE) 1 g tablet Take 1 tablet (1 g total) by mouth 3 (three) times daily before meals. 90 tablet 3   sulfamethoxazole-trimethoprim (BACTRIM DS) 800-160 MG tablet Take 1 tablet by mouth 2 (two) times daily.     tiZANidine (ZANAFLEX) 4 MG capsule SMARTSIG:1-2 Capsule(s) By Mouth Every 6-8 Hours PRN     tiZANidine (ZANAFLEX) 4  MG tablet Take 4 mg by mouth at bedtime.     traZODone (DESYREL) 50 MG tablet Take 100 mg by mouth at bedtime as needed.     VRAYLAR 1.5 MG capsule Take 1.5 mg by mouth daily.     propranolol (INDERAL) 10 MG tablet Take 1 tablet (10 mg total) by mouth 2 (two) times daily. (Patient not taking: Reported on 02/04/2023) 60 tablet 6   No current facility-administered medications for this visit.    Allergies as of 02/04/2023 - Review Complete 02/04/2023  Allergen Reaction Noted   Tape Other (See Comments) 12/17/2022   Buspirone  09/24/2022   Fluoxetine  09/24/2022   Nsaids Other (See Comments) 08/22/2020    Family History  Problem Relation Age of Onset   Anxiety disorder Mother    Hyperlipidemia Mother    Crohn's disease Sister    Diabetes Maternal Grandfather    Diabetes Cousin    Learning disabilities Cousin     Social History   Socioeconomic History   Marital status: Single    Spouse name: Not on file   Number of children: 1   Years of education: Not on file   Highest education level: Not on file  Occupational History   Not on file  Tobacco Use   Smoking status: Every Day    Packs/day: 1.00    Years: 10.00    Additional pack years: 0.00    Total pack years: 10.00    Types: Cigarettes    Passive exposure: Current   Smokeless tobacco: Never  Vaping Use   Vaping Use: Former  Substance and Sexual Activity   Alcohol use: Not Currently   Drug use: Yes    Comment: delta 9- quit   Sexual activity: Yes    Birth control/protection: Implant  Other Topics Concern   Not on file  Social History Narrative   Lives with son-67 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      3 dogs and 3 cats      Enjoy: crafts      Diet: eats all food groups -acidic foods   Caffeine: stopped in last week or so   Water: 6-8 cups daily       Wears seat belt   Does not drive   Smoke detectors at home   Fire extinguisher    No Weapons    Social Determinants of Health    Financial Resource Strain: Low Risk  (06/13/2022)   Overall Financial Resource Strain (CARDIA)    Difficulty of Paying Living Expenses: Not very hard  Food Insecurity: Food Insecurity Present (06/13/2022)   Hunger Vital Sign    Worried About Running Out of Food in the Last Year: Sometimes true    Ran Out of Food in the Last Year: Sometimes true  Transportation Needs: No Transportation Needs (06/13/2022)   PRAPARE - Administrator, Civil Service (Medical): No    Lack of Transportation (Non-Medical): No  Physical Activity: Insufficiently Active (06/13/2022)   Exercise Vital Sign    Days of Exercise per Week: 2 days    Minutes of Exercise per Session: 50 min  Stress: Stress Concern Present (06/13/2022)   [employer] of Occupational Health - Occupational Stress Questionnaire    Feeling of Stress : Rather much  Social Connections: Socially Isolated (06/13/2022)   Social Connection and Isolation Panel [NHANES]    Frequency of Communication with Friends and Family: Once a week    Frequency of Social Gatherings with Friends and Family: Never    Attends Religious Services: Never    Database administrator or Organizations: No    Attends Engineer, structural: Never    Marital Status: Never married    Review of systems General: negative for malaise, night sweats, fever, chills, +weight loss  Neck: Negative for lumps, goiter, pain and significant neck swelling Resp: Negative for cough, wheezing, dyspnea at rest CV: Negative for chest pain, leg swelling, palpitations, orthopnea GI: denies melena, hematochezia, diarrhea, dysphagia, odynophagia +nausea +vomiting +early satiety +weight loss +epigastric pain  MSK: Negative for  joint pain or swelling, back pain, and muscle pain. Derm: Negative for itching or rash Psych: Denies depression, anxiety, memory loss, confusion. No homicidal or suicidal ideation.  Heme: Negative for prolonged bleeding, bruising easily, and swollen  nodes. Endocrine: Negative for cold or heat intolerance, polyuria, polydipsia and goiter. Neuro: negative for tremor, gait imbalance, syncope and seizures. The remainder of the review of systems is noncontributory.  Physical Exam: BP 114/75 (BP Location: Left Arm, Patient Position: Sitting, Cuff Size: Normal)   Pulse 96   Temp 98.2 F (36.8 C) (Oral)   Ht 5\' 8"  (1.727 m)   Wt 179 lb (81.2 kg)   LMP 12/30/2022   BMI 27.22 kg/m  General:   Alert and oriented. No distress noted. Pleasant and cooperative.  Head:  Normocephalic and atraumatic. Eyes:  Conjuctiva clear without scleral icterus. Mouth:  Oral mucosa pink and moist. Good dentition. No lesions. Heart: Normal rate and rhythm, s1 and s2 heart sounds present.  Lungs: Clear lung sounds in all lobes. Respirations equal and unlabored. Abdomen:  +BS, soft, and non-distended. Mild TTP of epigastric region. No rebound or guarding. No HSM or masses noted. Derm: No palmar erythema or jaundice Msk:  Symmetrical without gross deformities. Normal posture. Extremities:  Without edema. Neurologic:  Alert and  oriented x4 Psych:  Alert and cooperative. Normal mood and affect.  Invalid input(s): "6 MONTHS"   ASSESSMENT: Debbie Santana is a 32 y.o. female presenting today for epigastric pain, nausea, vomiting.  History of H pylori, s/p treatment with Pylera last March, confirmed eradication via breath testing in September 2023. She has had multiple ED visits since March for epigastric pain, nausea and vomiting. Notes some improvement in symptoms when she got back on her PPI, Bentyl and anti emetics though symptoms have not resolved. she does report smoking delta 9 up until about 6 days ago when she was told in the ED this may be contributing, notably Haldol improved her symptoms in the ED. This certainly raises questions if symptoms are related to cannabis hyperemesis, however, given her continued symptoms and history of h pylori, would recommend  proceeding with EGD for further evaluation. She should continue to abstain from all marijuana use, continue PPI daily, carafate 1g QID, will refill anti emetics for PRN use and Dicyclomine. Indications, risks and benefits of procedure discussed in detail with patient. Patient verbalized understanding and is in agreement to proceed with EGD.  History of constipation, likely secondary to chronic opiate pain medication use, has had good results with linzess in the past, will provide samples to her today. She will make me aware if this is not keeping her constipation well managed. Encouraged to increase water intake, aim for 64 oz per day.     PLAN:  Continue with protonix 40mg  daily   2. Continue carafate 1g QID  3. Rx Bentyl 20mg  BID PRN 4. Linzess samples  5. Rx Zofran Q8h PRN 6. Rx Promethazine 12.5 mg suppository q8H PRN for severe nausea  7. Schedule EGD ASA II   All questions were answered, patient verbalized understanding and is in agreement with plan as outlined above.   Follow Up: 3 months    Ciarrah Rae L. Jeanmarie Hubert, MSN, APRN, AGNP-C Adult-Gerontology Nurse Practitioner [facility]   I have reviewed the note and agree with the APP's assessment as described in this progress note  If negative biopsies for H. pylori, will need to proceed with H. pylori stool testing to confirm  eradication.  Katrinka Blazing, MD Gastroenterology and Hepatology [facility]

## 2023-02-05 LAB — CERVICOVAGINAL ANCILLARY ONLY
Bacterial Vaginitis (gardnerella): POSITIVE — AB
Candida Glabrata: NEGATIVE
Candida Vaginitis: NEGATIVE
Chlamydia: NEGATIVE
Comment: NEGATIVE
Comment: NEGATIVE
Comment: NEGATIVE
Comment: NEGATIVE
Comment: NEGATIVE
Comment: NORMAL
Neisseria Gonorrhea: NEGATIVE
Trichomonas: POSITIVE — AB

## 2023-02-07 NOTE — Telephone Encounter (Signed)
Fax from [facility] PA needed for EGD.

## 2023-02-16 ENCOUNTER — Other Ambulatory Visit (INDEPENDENT_AMBULATORY_CARE_PROVIDER_SITE_OTHER): Payer: Self-pay | Admitting: Gastroenterology

## 2023-02-18 ENCOUNTER — Other Ambulatory Visit (HOSPITAL_COMMUNITY)
Admission: RE | Admit: 2023-02-18 | Discharge: 2023-02-18 | Disposition: A | Discharge status: Home / Self Care | Payer: Medicaid Other | Source: Ambulatory Visit | Attending: Adult Health | Admitting: Adult Health

## 2023-02-18 ENCOUNTER — Encounter: Payer: Self-pay | Admitting: Adult Health

## 2023-02-18 ENCOUNTER — Ambulatory Visit (INDEPENDENT_AMBULATORY_CARE_PROVIDER_SITE_OTHER): Payer: Medicaid Other | Admitting: Adult Health

## 2023-02-18 VITALS — BP 107/70 | HR 74 | Ht 68.0 in | Wt 178.5 lb

## 2023-02-18 DIAGNOSIS — R319 Hematuria, unspecified: Secondary | ICD-10-CM | POA: Diagnosis not present

## 2023-02-18 DIAGNOSIS — N898 Other specified noninflammatory disorders of vagina: Secondary | ICD-10-CM

## 2023-02-18 DIAGNOSIS — Z113 Encounter for screening for infections with a predominantly sexual mode of transmission: Secondary | ICD-10-CM

## 2023-02-18 DIAGNOSIS — A599 Trichomoniasis, unspecified: Secondary | ICD-10-CM | POA: Insufficient documentation

## 2023-02-18 DIAGNOSIS — Z87440 Personal history of urinary (tract) infections: Secondary | ICD-10-CM

## 2023-02-18 LAB — POCT URINALYSIS DIPSTICK: Leukocytes, UA: NEGATIVE

## 2023-02-18 NOTE — Progress Notes (Signed)
  Subjective:     Patient ID: Debbie Santana, female   DOB: 15-Dec-1990, 32 y.o.   MRN: [id number]  HPI Debbie Santana is a 32 year old white female, single, [id number], for proof of cure for recent trich, she did self swab, and she says feels like bladder not emptying, has recent UTI. She says still has white discharge.     Component Value Date/Time   DIAGPAP (A) 06/13/2022 1453    - Atypical squamous cells of undetermined significance (ASC-US)   DIAGPAP (A) 09/05/2021 1037    - Atypical squamous cells of undetermined significance (ASC-US)   DIAGPAP (A) 10/31/2020 1348    - Atypical squamous cells of undetermined significance (ASC-US)   HPVHIGH Negative 06/13/2022 1453   HPVHIGH Negative 09/05/2021 1037   HPVHIGH Negative 10/31/2020 1348   ADEQPAP  06/13/2022 1453    Satisfactory for evaluation; transformation zone component PRESENT.   ADEQPAP  09/05/2021 1037    Satisfactory for evaluation; transformation zone component PRESENT.   ADEQPAP  10/31/2020 1348    Satisfactory for evaluation; transformation zone component PRESENT.   PCP is Dr Sudie Bailey  Review of Systems For POC trich Still with white discharge, she did self swab Does not feel bladder emptying, has had recent UTI Reviewed past medical,surgical, social and family history. Reviewed medications and allergies.     Objective:   Physical Exam BP 107/70 (BP Location: Left Arm, Patient Position: Sitting, Cuff Size: Normal)   Pulse 74   Ht 5\' 8"  (1.727 m)   Wt 178 lb 8 oz (81 kg)   LMP 02/17/2023   BMI 27.14 kg/m  urine dipstick trace blood. Skin warm and dry. Lungs: clear to ausculation bilaterally. Cardiovascular: regular rate and rhythm.    Fall risk is moderate  Upstream - 02/18/23 1412       Pregnancy Intention Screening   Does the patient want to become pregnant in the next year? No    Does the patient's partner want to become pregnant in the next year? No    Would the patient like to discuss contraceptive options today?  No      Contraception Wrap Up   Current Method Hormonal Implant    End Method Hormonal Implant             Assessment:     1. Hematuria, unspecified type UA C&S sent to rule out UTI  - POCT Urinalysis Dipstick - Urine Culture - Urinalysis, Routine w reflex microscopic  2. History of UTI - POCT Urinalysis Dipstick - Urine Culture - Urinalysis, Routine w reflex microscopic  3. Vaginal discharge CV swab sent   4. Trichomoniasis CV swab sent   5. Screening examination for STD (sexually transmitted disease) CV swab sent for GC/CHL,trich and BV and yeast     Plan:     Follow up prn

## 2023-02-19 LAB — MICROSCOPIC EXAMINATION
Casts: NONE SEEN /lpf
WBC, UA: NONE SEEN /hpf (ref 0–5)

## 2023-02-19 LAB — URINALYSIS, ROUTINE W REFLEX MICROSCOPIC
Bilirubin, UA: NEGATIVE
Glucose, UA: NEGATIVE
Leukocytes,UA: NEGATIVE
Nitrite, UA: NEGATIVE
Specific Gravity, UA: >=1.03 — AB (ref 1.005–1.030)
Urobilinogen, Ur: 0.2 mg/dL (ref 0.2–1.0)
pH, UA: 5.5 (ref 5.0–7.5)

## 2023-02-20 ENCOUNTER — Other Ambulatory Visit: Payer: Self-pay | Admitting: Adult Health

## 2023-02-20 LAB — CERVICOVAGINAL ANCILLARY ONLY
Bacterial Vaginitis (gardnerella): NEGATIVE
Candida Glabrata: POSITIVE — AB
Candida Vaginitis: POSITIVE — AB
Chlamydia: NEGATIVE
Comment: NEGATIVE
Comment: NEGATIVE
Comment: NEGATIVE
Comment: NEGATIVE
Comment: NEGATIVE
Comment: NORMAL
Neisseria Gonorrhea: NEGATIVE
Trichomonas: NEGATIVE

## 2023-02-20 LAB — URINE CULTURE

## 2023-02-20 MED ORDER — FLUCONAZOLE 100 MG PO TABS: 100.0000 mg | ORAL_TABLET | Freq: Every day | ORAL | 0 refills | Status: AC

## 2023-02-20 NOTE — Progress Notes (Signed)
Has 2 + yeast on vaginal swab will rx diflucan 100 mg 1 daily for 14 days

## 2023-02-26 DIAGNOSIS — K209 Esophagitis, unspecified without bleeding: Secondary | ICD-10-CM | POA: Diagnosis not present

## 2023-02-26 DIAGNOSIS — Z79891 Long term (current) use of opiate analgesic: Secondary | ICD-10-CM | POA: Diagnosis not present

## 2023-02-26 DIAGNOSIS — G43911 Migraine, unspecified, intractable, with status migrainosus: Secondary | ICD-10-CM | POA: Diagnosis not present

## 2023-02-26 DIAGNOSIS — M5451 Vertebrogenic low back pain: Secondary | ICD-10-CM | POA: Diagnosis not present

## 2023-03-05 ENCOUNTER — Other Ambulatory Visit (INDEPENDENT_AMBULATORY_CARE_PROVIDER_SITE_OTHER): Payer: Self-pay | Admitting: Gastroenterology

## 2023-03-05 ENCOUNTER — Other Ambulatory Visit (HOSPITAL_COMMUNITY)
Admission: RE | Admit: 2023-03-05 | Discharge: 2023-03-05 | Disposition: A | Discharge status: Home / Self Care | Payer: Medicaid Other | Source: Ambulatory Visit | Attending: Gastroenterology | Admitting: Gastroenterology

## 2023-03-05 DIAGNOSIS — R1013 Epigastric pain: Secondary | ICD-10-CM | POA: Insufficient documentation

## 2023-03-05 LAB — PREGNANCY, URINE: Preg Test, Ur: NEGATIVE

## 2023-03-06 ENCOUNTER — Encounter (HOSPITAL_COMMUNITY)
Admission: RE | Disposition: A | Discharge status: Home / Self Care | Payer: Self-pay | Source: Home / Self Care | Attending: Gastroenterology

## 2023-03-06 ENCOUNTER — Other Ambulatory Visit: Payer: Self-pay

## 2023-03-06 ENCOUNTER — Encounter (HOSPITAL_COMMUNITY): Payer: Self-pay | Admitting: Gastroenterology

## 2023-03-06 ENCOUNTER — Ambulatory Visit (HOSPITAL_BASED_OUTPATIENT_CLINIC_OR_DEPARTMENT_OTHER): Payer: Medicaid Other | Admitting: Anesthesiology

## 2023-03-06 ENCOUNTER — Ambulatory Visit (HOSPITAL_COMMUNITY): Payer: Medicaid Other | Admitting: Anesthesiology

## 2023-03-06 ENCOUNTER — Ambulatory Visit (HOSPITAL_COMMUNITY)
Admission: RE | Admit: 2023-03-06 | Discharge: 2023-03-06 | Disposition: A | Discharge status: Home / Self Care | Payer: Medicaid Other | Attending: Gastroenterology | Admitting: Gastroenterology

## 2023-03-06 DIAGNOSIS — I10 Essential (primary) hypertension: Secondary | ICD-10-CM | POA: Diagnosis not present

## 2023-03-06 DIAGNOSIS — R1013 Epigastric pain: Secondary | ICD-10-CM

## 2023-03-06 DIAGNOSIS — K219 Gastro-esophageal reflux disease without esophagitis: Secondary | ICD-10-CM | POA: Diagnosis not present

## 2023-03-06 DIAGNOSIS — K529 Noninfective gastroenteritis and colitis, unspecified: Secondary | ICD-10-CM | POA: Diagnosis not present

## 2023-03-06 DIAGNOSIS — K319 Disease of stomach and duodenum, unspecified: Secondary | ICD-10-CM | POA: Insufficient documentation

## 2023-03-06 DIAGNOSIS — K449 Diaphragmatic hernia without obstruction or gangrene: Secondary | ICD-10-CM | POA: Diagnosis not present

## 2023-03-06 DIAGNOSIS — F17210 Nicotine dependence, cigarettes, uncomplicated: Secondary | ICD-10-CM | POA: Insufficient documentation

## 2023-03-06 DIAGNOSIS — G709 Myoneural disorder, unspecified: Secondary | ICD-10-CM | POA: Insufficient documentation

## 2023-03-06 DIAGNOSIS — F418 Other specified anxiety disorders: Secondary | ICD-10-CM | POA: Insufficient documentation

## 2023-03-06 DIAGNOSIS — Z79899 Other long term (current) drug therapy: Secondary | ICD-10-CM | POA: Diagnosis not present

## 2023-03-06 DIAGNOSIS — G43909 Migraine, unspecified, not intractable, without status migrainosus: Secondary | ICD-10-CM | POA: Diagnosis not present

## 2023-03-06 DIAGNOSIS — R1010 Upper abdominal pain, unspecified: Secondary | ICD-10-CM | POA: Diagnosis present

## 2023-03-06 DIAGNOSIS — Z8711 Personal history of peptic ulcer disease: Secondary | ICD-10-CM | POA: Insufficient documentation

## 2023-03-06 DIAGNOSIS — R112 Nausea with vomiting, unspecified: Secondary | ICD-10-CM

## 2023-03-06 DIAGNOSIS — A048 Other specified bacterial intestinal infections: Secondary | ICD-10-CM

## 2023-03-06 HISTORY — PX: ESOPHAGOGASTRODUODENOSCOPY (EGD) WITH PROPOFOL: SHX5813

## 2023-03-06 HISTORY — PX: BIOPSY: SHX5522

## 2023-03-06 SURGERY — ESOPHAGOGASTRODUODENOSCOPY (EGD) WITH PROPOFOL
Anesthesia: General

## 2023-03-06 MED ORDER — GLYCOPYRROLATE PF 0.2 MG/ML IJ SOSY
PREFILLED_SYRINGE | INTRAMUSCULAR | Status: DC | PRN
  Administered 2023-03-06: .2 mg via INTRAVENOUS

## 2023-03-06 MED ORDER — LACTATED RINGERS IV SOLN: INTRAVENOUS | Status: DC

## 2023-03-06 MED ORDER — LIDOCAINE HCL (CARDIAC) PF 100 MG/5ML IV SOSY
PREFILLED_SYRINGE | INTRAVENOUS | Status: DC | PRN
  Administered 2023-03-06: 100 mg via INTRATRACHEAL

## 2023-03-06 MED ORDER — PROPOFOL 10 MG/ML IV BOLUS
INTRAVENOUS | Status: DC | PRN
  Administered 2023-03-06: 80 mg via INTRAVENOUS
  Administered 2023-03-06: 50 mg via INTRAVENOUS
  Administered 2023-03-06: 20 mg via INTRAVENOUS
  Administered 2023-03-06: 30 mg via INTRAVENOUS
  Administered 2023-03-06 (×2): 20 mg via INTRAVENOUS
  Administered 2023-03-06: 40 mg via INTRAVENOUS

## 2023-03-06 MED ORDER — PANTOPRAZOLE SODIUM 40 MG PO TBEC: 40.0000 mg | DELAYED_RELEASE_TABLET | Freq: Two times a day (BID) | ORAL | 0 refills | Status: DC

## 2023-03-06 MED ORDER — LACTATED RINGERS IV SOLN: INTRAVENOUS | Status: DC | PRN

## 2023-03-06 NOTE — Discharge Instructions (Signed)
You are being discharged to home.  Resume your previous diet.  We are waiting for your pathology results.  Continue your present medications.  Smoking cessation. If normal biopsies, will need to perform H. pylori breath test off PPI for 2 weeks.

## 2023-03-06 NOTE — Interval H&P Note (Signed)
History and Physical Interval Note:  03/06/2023 10:46 AM  Debbie Santana  has presented today for surgery, with the diagnosis of EPIGASTRIC PAIN, N/V, HISTORY H PYLORI.  The various methods of treatment have been discussed with the patient and family. After consideration of risks, benefits and other options for treatment, the patient has consented to  Procedure(s) with comments: ESOPHAGOGASTRODUODENOSCOPY (EGD) WITH PROPOFOL (N/A) - 11:45AM;ASA 2 as a surgical intervention.  The patient's history has been reviewed, patient examined, no change in status, stable for surgery.  I have reviewed the patient's chart and labs.  Questions were answered to the patient's satisfaction.     Debbie Santana

## 2023-03-06 NOTE — Anesthesia Postprocedure Evaluation (Signed)
Anesthesia Post Note  Patient: Debbie Santana  Procedure(s) Performed: ESOPHAGOGASTRODUODENOSCOPY (EGD) WITH PROPOFOL BIOPSY  Patient location during evaluation: Phase II Anesthesia Type: General Level of consciousness: awake and alert and oriented Pain management: pain level controlled Vital Signs Assessment: post-procedure vital signs reviewed and stable Respiratory status: spontaneous breathing, nonlabored ventilation and respiratory function stable Cardiovascular status: blood pressure returned to baseline and stable Postop Assessment: no apparent nausea or vomiting Anesthetic complications: no  No notable events documented.   Last Vitals:  Vitals:   03/06/23 1047 03/06/23 1150  BP: 106/61 (!) 95/59  Pulse: 72 94  Resp: 18 18  Temp: 36.8 C 36.5 C  SpO2: 96% 95%    Last Pain:  Vitals:   03/06/23 1150  TempSrc: Oral  PainSc: 4                  Lio Wehrly C Yeudiel Mateo

## 2023-03-06 NOTE — Anesthesia Preprocedure Evaluation (Signed)
Anesthesia Evaluation  Patient identified by MRN, date of birth, ID band Patient awake    Reviewed: Allergy & Precautions, H&P , NPO status , Patient's Chart, lab work & pertinent test results  Airway Mallampati: II  TM Distance: >3 FB Neck ROM: Full    Dental  (+) Edentulous Upper, Edentulous Lower   Pulmonary neg pulmonary ROS, Current Smoker   Pulmonary exam normal breath sounds clear to auscultation       Cardiovascular Exercise Tolerance: Good hypertension, Pt. on medications Normal cardiovascular exam Rhythm:Regular Rate:Normal     Neuro/Psych  Headaches PSYCHIATRIC DISORDERS Anxiety Depression     Neuromuscular disease    GI/Hepatic PUD,GERD  Medicated and Poorly Controlled,,(+)     substance abuse  marijuana use  Endo/Other  negative endocrine ROS    Renal/GU negative Renal ROS  negative genitourinary   Musculoskeletal negative musculoskeletal ROS (+)    Abdominal   Peds negative pediatric ROS (+)  Hematology negative hematology ROS (+)   Anesthesia Other Findings   Reproductive/Obstetrics negative OB ROS                              Anesthesia Physical Anesthesia Plan  ASA: 2  Anesthesia Plan: General   Post-op Pain Management: Minimal or no pain anticipated   Induction: Intravenous  PONV Risk Score and Plan: 1 and Propofol infusion  Airway Management Planned: Nasal Cannula and Natural Airway  Additional Equipment:   Intra-op Plan:   Post-operative Plan:   Informed Consent: I have reviewed the patients History and Physical, chart, labs and discussed the procedure including the risks, benefits and alternatives for the proposed anesthesia with the patient or authorized representative who has indicated his/her understanding and acceptance.     Dental advisory given  Plan Discussed with: CRNA and Surgeon  Anesthesia Plan Comments:          Anesthesia  Quick Evaluation

## 2023-03-06 NOTE — Op Note (Addendum)
[facility] Patient Name: Debbie Santana Procedure Date: 03/06/2023 11:30 AM MRN: [id number] Date of Birth: April 12, 1991 Attending MD: Katrinka Blazing , , [id number] CSN: [id number] Age: 32 Admit Type: Outpatient Procedure:                Upper GI endoscopy Indications:              Upper abdominal pain Providers:                Katrinka Blazing, Buel Ream. Thomasena Edis RN, RN, Dyann Ruddle Referring MD:              Medicines:                Monitored Anesthesia Care Complications:            No immediate complications. Estimated Blood Loss:     Estimated blood loss: none. Procedure:                Pre-Anesthesia Assessment:                           - Prior to the procedure, a History and Physical                            was performed, and patient medications, allergies                            and sensitivities were reviewed. The patient's                            tolerance of previous anesthesia was reviewed.                           - The risks and benefits of the procedure and the                            sedation options and risks were discussed with the                            patient. All questions were answered and informed                            consent was obtained.                           After obtaining informed consent, the endoscope was                            passed under direct vision. Throughout the                            procedure, the patient's blood pressure, pulse, and                            oxygen saturations were monitored continuously. The  [id number]-[id number] ([id number]) scope was introduced through the                            mouth, and advanced to the second part of duodenum.                            The upper GI endoscopy was accomplished without                            difficulty. The patient tolerated the procedure                            well. Scope In: 11:38:30 AM Scope  Out: 11:46:55 AM Total Procedure Duration: 0 hours 8 minutes 25 seconds  Findings:      A 2 cm hiatal hernia was present.      The gastroesophageal flap valve was visualized endoscopically and       classified as Hill Grade III (minimal fold, loose to endoscope, hiatal       hernia likely).      The entire examined stomach was normal. Biopsies were taken with a cold       forceps for Helicobacter pylori testing.      The examined duodenum was normal. Biopsies were taken with a cold       forceps for histology. Impression:               - 2 cm hiatal hernia.                           - Normal stomach. Biopsied.                           - Normal examined duodenum. Biopsied. Moderate Sedation:      Per Anesthesia Care Recommendation:           - Discharge patient to home (ambulatory).                           - Resume previous diet.                           - Await pathology results.                           - Continue present medications. Increase                            pantoprazole to 40 mg twice a day.                           - Smoking cessation.                           - If normal biopsies, will need to perform H.                            pylori breath test off PPI for 2 weeks. Procedure Code(s):        ---  Professional ---                           [phone], Esophagogastroduodenoscopy, flexible,                            transoral; with biopsy, single or multiple Diagnosis Code(s):        --- Professional ---                           K44.9, Diaphragmatic hernia without obstruction or                            gangrene                           R10.10, Upper abdominal pain, unspecified CPT copyright 2022 [employer]. All rights reserved. The codes documented in this report are preliminary and upon coder review may  be revised to meet current compliance requirements. Katrinka Blazing, MD Katrinka Blazing,  03/06/2023 11:56:57 AM This report has been  signed electronically. Number of Addenda: 0

## 2023-03-06 NOTE — Transfer of Care (Signed)
Immediate Anesthesia Transfer of Care Note  Patient: NATESHA HASSEY  Procedure(s) Performed: ESOPHAGOGASTRODUODENOSCOPY (EGD) WITH PROPOFOL BIOPSY  Patient Location: PACU  Anesthesia Type:General  Level of Consciousness: awake, alert , oriented, and patient cooperative  Airway & Oxygen Therapy: Patient Spontanous Breathing  Post-op Assessment: Report given to RN, Post -op Vital signs reviewed and stable, and Patient moving all extremities X 4  Post vital signs: Reviewed and stable  Last Vitals:  Vitals Value Taken Time  BP 95/59 03/06/23 1150  Temp 36.5 C 03/06/23 1150  Pulse 94 03/06/23 1150  Resp 18 03/06/23 1150  SpO2 95 % 03/06/23 1150    Last Pain:  Vitals:   03/06/23 1150  TempSrc: Oral  PainSc: 4       Patients Stated Pain Goal: 6 (03/06/23 1039)  Complications: No notable events documented.

## 2023-03-07 LAB — SURGICAL PATHOLOGY

## 2023-03-10 ENCOUNTER — Other Ambulatory Visit (INDEPENDENT_AMBULATORY_CARE_PROVIDER_SITE_OTHER): Payer: Self-pay

## 2023-03-10 DIAGNOSIS — K279 Peptic ulcer, site unspecified, unspecified as acute or chronic, without hemorrhage or perforation: Secondary | ICD-10-CM

## 2023-03-10 DIAGNOSIS — K2100 Gastro-esophageal reflux disease with esophagitis, without bleeding: Secondary | ICD-10-CM

## 2023-03-10 DIAGNOSIS — K2960 Other gastritis without bleeding: Secondary | ICD-10-CM

## 2023-03-12 ENCOUNTER — Encounter (HOSPITAL_COMMUNITY): Payer: Self-pay | Admitting: Gastroenterology

## 2023-03-16 ENCOUNTER — Other Ambulatory Visit (INDEPENDENT_AMBULATORY_CARE_PROVIDER_SITE_OTHER): Payer: Self-pay | Admitting: Gastroenterology

## 2023-04-01 ENCOUNTER — Other Ambulatory Visit: Payer: Self-pay

## 2023-04-01 ENCOUNTER — Encounter (HOSPITAL_COMMUNITY): Payer: Self-pay

## 2023-04-01 ENCOUNTER — Emergency Department (HOSPITAL_COMMUNITY)
Admission: EM | Admit: 2023-04-01 | Discharge: 2023-04-02 | Disposition: A | Discharge status: Home / Self Care | Payer: Medicaid Other | Attending: Emergency Medicine | Admitting: Emergency Medicine

## 2023-04-01 DIAGNOSIS — R112 Nausea with vomiting, unspecified: Secondary | ICD-10-CM | POA: Insufficient documentation

## 2023-04-01 DIAGNOSIS — R519 Headache, unspecified: Secondary | ICD-10-CM | POA: Insufficient documentation

## 2023-04-01 DIAGNOSIS — G4489 Other headache syndrome: Secondary | ICD-10-CM | POA: Diagnosis not present

## 2023-04-01 DIAGNOSIS — R000 Tachycardia, unspecified: Secondary | ICD-10-CM | POA: Diagnosis not present

## 2023-04-01 MED ORDER — DEXAMETHASONE SODIUM PHOSPHATE 10 MG/ML IJ SOLN
10.0000 mg | Freq: Once | INTRAMUSCULAR | Status: AC
  Administered 2023-04-01: 10 mg via INTRAVENOUS
  Filled 2023-04-01: qty 1

## 2023-04-01 MED ORDER — METOCLOPRAMIDE HCL 5 MG/ML IJ SOLN
10.0000 mg | Freq: Once | INTRAMUSCULAR | Status: AC
  Administered 2023-04-01: 10 mg via INTRAVENOUS
  Filled 2023-04-01: qty 2

## 2023-04-01 MED ORDER — SODIUM CHLORIDE 0.9 % IV BOLUS
1000.0000 mL | Freq: Once | INTRAVENOUS | Status: AC
  Administered 2023-04-01: 1000 mL via INTRAVENOUS

## 2023-04-01 MED ORDER — DIPHENHYDRAMINE HCL 50 MG/ML IJ SOLN
25.0000 mg | Freq: Once | INTRAMUSCULAR | Status: AC
  Administered 2023-04-01: 25 mg via INTRAVENOUS
  Filled 2023-04-01: qty 1

## 2023-04-01 NOTE — ED Provider Notes (Signed)
Debbie Santana Provider Note   CSN: [id number] Arrival date & time: 04/01/23  2101     History  Chief Complaint  Patient presents with   Headache   Emesis    Debbie Santana is a 32 y.o. female.  She has PMH of peptic ulcer disease, presents the ER today complaining of headache with nausea and vomiting.  States his left frontal headache described as throbbing.  She has had this to a headache many times in the past.  She is normally she takes Fioricet and it gets better but she did not have any Fioricet to take.  She did not take anything for this headache.  Denies fevers or chills or neck stiffness, no numbness tingling or weakness.   Headache Associated symptoms: vomiting   Emesis Associated symptoms: headaches        Home Medications Prior to Admission medications   Medication Sig Start Date End Date Taking? Authorizing Provider  Acetaminophen-Caff-Pyrilamine (MIDOL COMPLETE) 500-60-15 MG TABS Take 2 tablets by mouth 3 (three) times daily as needed (pain.).    [provider]  albuterol (VENTOLIN HFA) 108 (90 Base) MCG/ACT inhaler Inhale 2 puffs into the lungs every 6 (six) hours as needed for wheezing or shortness of breath. 09/11/20   Catarina Hartshorn, MD  butalbital-acetaminophen-caffeine (FIORICET) [phone] MG tablet TAKE 1 TABLET BY MOUTH EVERY 6 HOURS AS NEEDED FOR MIGRAINE HEADACHE 12/04/20   Adline Potter, NP  clonazePAM (KLONOPIN) 1 MG tablet Take 1 mg by mouth 4 (four) times daily as needed for anxiety. 10/23/22   [provider]  dicyclomine (BENTYL) 20 MG tablet Take 1 tablet (20 mg total) by mouth 2 (two) times daily. 01/30/23   Eber Hong, MD  diphenhydrAMINE (BENADRYL) 25 MG tablet Take 25 mg by mouth every 6 (six) hours as needed for allergies.    [provider]  etonogestrel (NEXPLANON) 68 MG IMPL implant 68 mg by Subdermal route once.    [provider]  hyoscyamine (ANASPAZ) 0.125 MG  TBDP disintergrating tablet Place 0.125 mg under the tongue every 6 (six) hours as needed for cramping.    [provider]  linaclotide (LINZESS) 72 MCG capsule Take 72 mcg by mouth daily as needed (constipation.).    [provider]  naloxone [facility]) 2 MG/2ML injection Spray one-half (1 ml) of syringe into each nostril upon signs of opioid overdose. Call 911. May repeat in 3-5 minutes as needed, as directed 01/21/23   [provider]  ondansetron (ZOFRAN-ODT) 8 MG disintegrating tablet DISSOLVE 1 TABLET IN MOUTH EVERY 4 TO 8 HOURS AS NEEDED FOR NAUSEA AND VOMITING 03/17/23   Carlan, Chelsea L, NP  oxyCODONE-acetaminophen (PERCOCET) 10-325 MG tablet Take 1 tablet by mouth 3 (three) times daily as needed (pain). 06/05/22   [provider]  pantoprazole (PROTONIX) 40 MG tablet Take 1 tablet (40 mg total) by mouth 2 (two) times daily. 03/06/23   Dolores Frame, MD  promethazine (PHENERGAN) 12.5 MG suppository Place 1 suppository (12.5 mg total) rectally every 8 (eight) hours as needed for nausea or vomiting. 02/04/23   Carlan, Chelsea L, NP  Sertraline HCl 200 MG CAPS Take 200 mg by mouth daily after supper.    [provider]  SPIRIVA RESPIMAT 2.5 MCG/ACT AERS Inhale 2 each into the lungs daily as needed (respiratory issues.). 09/24/22   [provider]  sucralfate (CARAFATE) 1 g tablet Take 1 tablet (1 g total) by mouth 3 (  three) times daily before meals. 11/06/21   Carlan, Chelsea L, NP  tiZANidine (ZANAFLEX) 4 MG tablet Take 4-8 mg by mouth every 6 (six) hours as needed for muscle spasms.    [provider]  traZODone (DESYREL) 50 MG tablet Take 100 mg by mouth at bedtime. 01/21/23   [provider]  VRAYLAR 1.5 MG capsule Take 1.5 mg by mouth every evening. 09/27/22   [provider]      Allergies    Tape, Buspirone, Fluoxetine, and Nsaids    Review of Systems   Review of Systems  Gastrointestinal:  Positive for  vomiting.  Neurological:  Positive for headaches.    Physical Exam Updated Vital Signs BP 134/73   Pulse 74   Temp 98.9 F (37.2 C)   Resp 18   SpO2 98%  Physical Exam Vitals and nursing note reviewed.  Constitutional:      General: She is not in acute distress.    Appearance: She is well-developed.  HENT:     Head: Normocephalic and atraumatic.  Eyes:     General: No visual field deficit.    Extraocular Movements: Extraocular movements intact.     Conjunctiva/sclera: Conjunctivae normal.     Pupils: Pupils are equal, round, and reactive to light.  Cardiovascular:     Rate and Rhythm: Normal rate and regular rhythm.     Heart sounds: No murmur heard. Pulmonary:     Effort: Pulmonary effort is normal. No respiratory distress.     Breath sounds: Normal breath sounds.  Abdominal:     Palpations: Abdomen is soft.     Tenderness: There is no abdominal tenderness.  Musculoskeletal:        General: No swelling.     Cervical back: Normal range of motion and neck supple. No rigidity.  Skin:    General: Skin is warm and dry.     Capillary Refill: Capillary refill takes less than 2 seconds.  Neurological:     Mental Status: She is alert.     GCS: GCS eye subscore is 4. GCS verbal subscore is 5. GCS motor subscore is 6.     Cranial Nerves: No cranial nerve deficit, dysarthria or facial asymmetry.     Sensory: No sensory deficit.     Motor: No weakness.     Coordination: Coordination normal.     Gait: Gait normal.  Psychiatric:        Mood and Affect: Mood normal.        Speech: Speech normal.        Behavior: Behavior normal.     ED Results / Procedures / Treatments   Labs (all labs ordered are listed, but only abnormal results are displayed) Labs Reviewed - No data to display  EKG None  Radiology No results found.  Procedures Procedures    Medications Ordered in ED Medications  diphenhydrAMINE (BENADRYL) injection 25 mg (25 mg Intravenous Given 04/01/23 2141)   metoCLOPramide (REGLAN) injection 10 mg (10 mg Intravenous Given 04/01/23 2141)  sodium chloride 0.9 % bolus 1,000 mL (1,000 mLs Intravenous New Bag/Given 04/01/23 2142)  dexamethasone (DECADRON) injection 10 mg (10 mg Intravenous Given 04/01/23 2142)    ED Course/ Medical Decision Making/ A&P                             Medical Decision Making This patient presents to the ED for concern of headache and vomiting, this involves  an extensive number of treatment options, and is a complaint that carries with it a high risk of complications and morbidity.  The differential diagnosis includes migraine, tension headache, Intracranial hemorrhage, cluster headache, medication overuse headache, intracranial mass, opiate withdrawal, other    Co morbidities that complicate the patient evaluation :   Cannabis hyperemesis, peptic ulcer disease,opiate dependance   Additional history obtained:  Additional history obtained from EMR External records from outside source obtained and reviewed including notes  Problem List / ED Course / Critical interventions / Medication management  Headache-patient states she has history of headaches having left frontal headache described as throbbing today, feels exactly prior headaches.  States she has been vomiting with this, she states she normally vomits when she has headaches.  She had normal neurologic exam, denies any fevers, she has no neck stiffness, no other worrisome or red flag symptoms.  Symptoms resolved with migraine cocktail. No chance of pregnancy and had negative pregnancy test 3 weeks ago.  She is feeling much better and is requesting to go at this time, she is tolerating p.o. I ordered medication including pain, Benadryl, dexamethasone for headache Reevaluation of the patient after these medicines showed that the patient improved I have reviewed the patients home medicines and have made adjustments as needed       Risk Prescription drug  management.           Final Clinical Impression(s) / ED Diagnoses Final diagnoses:  Bad headache    Rx / DC Orders ED Discharge Orders     None         Josem Kaufmann 04/01/23 2304    Eber Hong, MD 04/02/23 1227

## 2023-04-01 NOTE — ED Triage Notes (Addendum)
Pt took friends Suboxone at 8am this morning. Pt has been prescribed oxys and ran out and gets them filled tomorrow and states that she did not want to get sick since she does not have them. At 5pm pt started  c/o of headache and emesis.   Pt states she has not had any oxy since yesterday morning. Pt states she missed doctors appointment to get it filled.

## 2023-04-01 NOTE — ED Notes (Signed)
Patient verbalizes understanding of discharge instructions. Opportunity for questioning and answers were provided. Armband removed by staff, pt discharged from ED. Ambulated out to lobby  

## 2023-04-01 NOTE — Discharge Instructions (Signed)
Seen today for headache.  You are feeling better after medications.  Your exam and vital signs are reassuring.  If you develop new or worsening symptoms please come back to the ER right away.  Otherwise follow-up with your primary care doctor.

## 2023-04-07 DIAGNOSIS — G43911 Migraine, unspecified, intractable, with status migrainosus: Secondary | ICD-10-CM | POA: Diagnosis not present

## 2023-04-07 DIAGNOSIS — Z79891 Long term (current) use of opiate analgesic: Secondary | ICD-10-CM | POA: Diagnosis not present

## 2023-04-07 DIAGNOSIS — F32A Depression, unspecified: Secondary | ICD-10-CM | POA: Diagnosis not present

## 2023-04-07 DIAGNOSIS — M5451 Vertebrogenic low back pain: Secondary | ICD-10-CM | POA: Diagnosis not present

## 2023-04-28 ENCOUNTER — Other Ambulatory Visit: Payer: Self-pay | Admitting: Women's Health

## 2023-04-28 MED ORDER — FLUCONAZOLE 150 MG PO TABS: ORAL_TABLET | ORAL | 0 refills | Status: DC

## 2023-05-01 ENCOUNTER — Encounter: Payer: Self-pay | Admitting: Internal Medicine

## 2023-05-01 ENCOUNTER — Ambulatory Visit: Payer: Medicaid Other | Admitting: Internal Medicine

## 2023-05-01 VITALS — BP 120/77 | HR 93 | Ht 68.0 in | Wt 176.4 lb

## 2023-05-01 DIAGNOSIS — Z131 Encounter for screening for diabetes mellitus: Secondary | ICD-10-CM | POA: Diagnosis not present

## 2023-05-01 DIAGNOSIS — F32A Depression, unspecified: Secondary | ICD-10-CM | POA: Diagnosis not present

## 2023-05-01 DIAGNOSIS — Z8669 Personal history of other diseases of the nervous system and sense organs: Secondary | ICD-10-CM | POA: Diagnosis not present

## 2023-05-01 DIAGNOSIS — K2100 Gastro-esophageal reflux disease with esophagitis, without bleeding: Secondary | ICD-10-CM | POA: Diagnosis not present

## 2023-05-01 DIAGNOSIS — G894 Chronic pain syndrome: Secondary | ICD-10-CM

## 2023-05-01 DIAGNOSIS — Z1329 Encounter for screening for other suspected endocrine disorder: Secondary | ICD-10-CM

## 2023-05-01 DIAGNOSIS — Z13220 Encounter for screening for lipoid disorders: Secondary | ICD-10-CM

## 2023-05-01 DIAGNOSIS — Z720 Tobacco use: Secondary | ICD-10-CM | POA: Diagnosis not present

## 2023-05-01 DIAGNOSIS — G8929 Other chronic pain: Secondary | ICD-10-CM | POA: Diagnosis not present

## 2023-05-01 DIAGNOSIS — K219 Gastro-esophageal reflux disease without esophagitis: Secondary | ICD-10-CM | POA: Diagnosis not present

## 2023-05-01 DIAGNOSIS — G250 Essential tremor: Secondary | ICD-10-CM

## 2023-05-01 DIAGNOSIS — M5116 Intervertebral disc disorders with radiculopathy, lumbar region: Secondary | ICD-10-CM

## 2023-05-01 DIAGNOSIS — F419 Anxiety disorder, unspecified: Secondary | ICD-10-CM

## 2023-05-01 DIAGNOSIS — Z1321 Encounter for screening for nutritional disorder: Secondary | ICD-10-CM
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
## 2023-05-01 NOTE — Assessment & Plan Note (Signed)
Managed by her PCP previously.  She is currently prescribed sertraline 200 mg daily, Vraylar 1.5 mg daily, and clonazepam 1 mg up to 4 times daily.  She reports taking Klonopin 1 mg at night as needed.  PDMP reviewed. -No medication changes have made today.  I offered to place referral to psychiatry in the setting of chronic benzodiazepine use, however she is not interested at this time. -We will continue clonazepam 1 mg nightly as needed as well as sertraline and Vraylar at their current doses.  Controlled substance agreement signed today.  UDS pending.

## 2023-05-01 NOTE — Patient Instructions (Signed)
It was a pleasure to see you today.  Thank you for giving [place] the opportunity to be involved in your care.  Below is a brief recap of your visit and next steps.  We will plan to see you again in 6 months.  Summary You have established care today Basic labs have been ordered Referral for pain management placed We will follow up in 6 months

## 2023-05-01 NOTE — Assessment & Plan Note (Signed)
She currently smokes 1.5 packs/day and has been smoking since age 32.  She is precontemplative with regards to cessation. -The patient was counseled on the dangers of tobacco use, and was advised to quit and reluctant to quit.  Reviewed strategies to maximize success, including removing cigarettes and smoking materials from environment, stress management, substitution of other forms of reinforcement, support of family/friends, and written materials.

## 2023-05-01 NOTE — Assessment & Plan Note (Signed)
Using Fioricet as needed for migraine relief

## 2023-05-01 NOTE — Assessment & Plan Note (Signed)
She endorses a history of chronic lumbar back pain and has previously undergone surgery on her lumbar spine for a ruptured disc.  She is followed by Dr. Lovell Sheehan with [facility] in [place].  Pain is currently managed with oxycodone-acetaminophen 10-325 mg 3 times daily as needed. -I have reviewed with Ms. Buehrle my concern for chronic opioid use, particularly when used concomitantly with benzodiazepines.  Through shared decision making, a referral to pain management has been placed today.

## 2023-05-01 NOTE — Assessment & Plan Note (Signed)
She reports that she has recently been started on propranolol by her previous PCP for management of essential tremor.  Tremor is absent on exam today.

## 2023-05-01 NOTE — Assessment & Plan Note (Signed)
Followed by gastroenterology for management of GERD, PUD, and erosive gastritis.  Underwent EGD in June that revealed a small hiatal hernia.  Biopsies obtained were consistent with reactive gastritis.  An H. pylori test is pending.  She is currently prescribed Carafate, Protonix, Bentyl, and Linzess.  Protonix is currently held until completion of H. pylori test.

## 2023-05-07 ENCOUNTER — Other Ambulatory Visit: Payer: Self-pay

## 2023-05-07 ENCOUNTER — Encounter: Payer: Self-pay | Admitting: Internal Medicine

## 2023-05-07 DIAGNOSIS — F419 Anxiety disorder, unspecified: Secondary | ICD-10-CM

## 2023-05-07 NOTE — Progress Notes (Signed)
Referral placed.

## 2023-05-13 ENCOUNTER — Other Ambulatory Visit: Payer: Self-pay

## 2023-05-13 DIAGNOSIS — Z79891 Long term (current) use of opiate analgesic: Secondary | ICD-10-CM

## 2023-05-13 DIAGNOSIS — Z79899 Other long term (current) drug therapy: Secondary | ICD-10-CM

## 2023-05-13 NOTE — Telephone Encounter (Signed)
Spoke with patient and patient will come by tomorrow 05/14/2023.

## 2023-05-15 ENCOUNTER — Telehealth: Payer: Self-pay | Admitting: Internal Medicine

## 2023-05-15 DIAGNOSIS — Z79891 Long term (current) use of opiate analgesic: Secondary | ICD-10-CM | POA: Diagnosis not present

## 2023-05-15 DIAGNOSIS — Z79899 Other long term (current) drug therapy: Secondary | ICD-10-CM | POA: Diagnosis not present

## 2023-05-15 NOTE — Telephone Encounter (Signed)
Patient is here taking her drug test- says she was told to bring in and let the nurse know of her Zolpidem 10mg . Pt says it is out of refills and is no longer taking it but wanted Dr Durwin Nora and his nurse to be aware Thank you

## 2023-05-21 LAB — TOXASSURE SELECT 13 (MW), URINE

## 2023-05-22 ENCOUNTER — Encounter: Payer: Self-pay | Admitting: Internal Medicine

## 2023-05-29 ENCOUNTER — Ambulatory Visit (INDEPENDENT_AMBULATORY_CARE_PROVIDER_SITE_OTHER): Payer: Medicaid Other | Admitting: Gastroenterology

## 2023-06-03 ENCOUNTER — Other Ambulatory Visit: Payer: Self-pay | Admitting: Internal Medicine

## 2023-06-03 ENCOUNTER — Telehealth: Payer: Self-pay | Admitting: Internal Medicine

## 2023-06-03 ENCOUNTER — Encounter: Payer: Self-pay | Admitting: Internal Medicine

## 2023-06-03 DIAGNOSIS — M5116 Intervertebral disc disorders with radiculopathy, lumbar region: Secondary | ICD-10-CM

## 2023-06-03 MED ORDER — OXYCODONE-ACETAMINOPHEN 10-325 MG PO TABS
1.0000 | ORAL_TABLET | Freq: Three times a day (TID) | ORAL | 0 refills | Status: DC | PRN
Start: 2023-06-03 — End: 2023-06-10

## 2023-06-03 NOTE — Telephone Encounter (Signed)
Prescription Request  06/03/2023  LOV: 05/01/2023  What is the name of the medication or equipment? oxyCODONE-acetaminophen (PERCOCET) 10-325 MG tablet [[id number]]    Have you contacted your pharmacy to request a refill? Yes   Which pharmacy would you like this sent to?  [facility] [address], [place] - [address] [place] [address] Phone: [phone] Fax: [phone]    Patient notified that their request is being sent to the clinical staff for review and that they should receive a response within 2 business days.   Please advise at Mobile [phone] (mobile)    MED NEEDS PRE AUTH

## 2023-06-03 NOTE — Telephone Encounter (Signed)
Medication sent today by Dr Allena Katz

## 2023-06-03 NOTE — Telephone Encounter (Signed)
Patient called said needs prior authorization on this medication

## 2023-06-03 NOTE — Telephone Encounter (Signed)
PA submitted.

## 2023-06-03 NOTE — Telephone Encounter (Signed)
Patient called said her pharmacy needed a prior authorization on this medication.

## 2023-06-10 MED ORDER — OXYCODONE-ACETAMINOPHEN 10-325 MG PO TABS
1.0000 | ORAL_TABLET | Freq: Three times a day (TID) | ORAL | 0 refills | Status: AC | PRN
Start: 2023-06-10 — End: ?

## 2023-06-10 NOTE — Telephone Encounter (Signed)
Patient called asking when will the remainder of her medicine will be sent into her pharmacy. It was only refilled for 10 days while provider was out of the office.   Pharmacy: [facility]

## 2023-06-10 NOTE — Telephone Encounter (Signed)
Mychart message fwd to provider

## 2023-06-15 DIAGNOSIS — M129 Arthropathy, unspecified: Secondary | ICD-10-CM | POA: Diagnosis not present

## 2023-06-15 DIAGNOSIS — D539 Nutritional anemia, unspecified: Secondary | ICD-10-CM | POA: Diagnosis not present

## 2023-06-15 DIAGNOSIS — R5383 Other fatigue: Secondary | ICD-10-CM | POA: Diagnosis not present

## 2023-06-15 DIAGNOSIS — M542 Cervicalgia: Secondary | ICD-10-CM | POA: Diagnosis not present

## 2023-06-15 DIAGNOSIS — M545 Low back pain, unspecified: Secondary | ICD-10-CM | POA: Diagnosis not present

## 2023-06-15 DIAGNOSIS — Z79891 Long term (current) use of opiate analgesic: Secondary | ICD-10-CM | POA: Diagnosis not present

## 2023-06-15 DIAGNOSIS — Z6826 Body mass index (BMI) 26.0-26.9, adult: Secondary | ICD-10-CM | POA: Diagnosis not present

## 2023-06-15 DIAGNOSIS — R0602 Shortness of breath: Secondary | ICD-10-CM | POA: Diagnosis not present

## 2023-06-15 DIAGNOSIS — Z131 Encounter for screening for diabetes mellitus: Secondary | ICD-10-CM | POA: Diagnosis not present

## 2023-06-15 DIAGNOSIS — E7800 Pure hypercholesterolemia, unspecified: Secondary | ICD-10-CM | POA: Diagnosis not present

## 2023-06-15 DIAGNOSIS — E559 Vitamin D deficiency, unspecified: Secondary | ICD-10-CM | POA: Diagnosis not present

## 2023-06-15 DIAGNOSIS — Z1159 Encounter for screening for other viral diseases: Secondary | ICD-10-CM | POA: Diagnosis not present

## 2023-06-18 ENCOUNTER — Other Ambulatory Visit (INDEPENDENT_AMBULATORY_CARE_PROVIDER_SITE_OTHER): Payer: Medicaid Other

## 2023-06-18 ENCOUNTER — Other Ambulatory Visit: Payer: Self-pay

## 2023-06-18 ENCOUNTER — Other Ambulatory Visit (HOSPITAL_COMMUNITY)
Admission: RE | Admit: 2023-06-18 | Discharge: 2023-06-18 | Disposition: A | Discharge status: Home / Self Care | Payer: Medicaid Other | Source: Ambulatory Visit | Attending: Obstetrics & Gynecology | Admitting: Obstetrics & Gynecology

## 2023-06-18 ENCOUNTER — Other Ambulatory Visit (INDEPENDENT_AMBULATORY_CARE_PROVIDER_SITE_OTHER): Payer: Self-pay | Admitting: Gastroenterology

## 2023-06-18 ENCOUNTER — Other Ambulatory Visit: Payer: Self-pay | Admitting: Internal Medicine

## 2023-06-18 ENCOUNTER — Other Ambulatory Visit: Payer: Medicaid Other

## 2023-06-18 ENCOUNTER — Encounter: Payer: Self-pay | Admitting: Internal Medicine

## 2023-06-18 DIAGNOSIS — N898 Other specified noninflammatory disorders of vagina: Secondary | ICD-10-CM | POA: Diagnosis not present

## 2023-06-18 DIAGNOSIS — Z113 Encounter for screening for infections with a predominantly sexual mode of transmission: Secondary | ICD-10-CM

## 2023-06-18 DIAGNOSIS — R109 Unspecified abdominal pain: Secondary | ICD-10-CM

## 2023-06-18 DIAGNOSIS — B851 Pediculosis due to Pediculus humanus corporis: Secondary | ICD-10-CM | POA: Diagnosis not present

## 2023-06-18 MED ORDER — PROMETHAZINE HCL 12.5 MG RE SUPP: 12.5000 mg | Freq: Three times a day (TID) | RECTAL | 0 refills | Status: DC | PRN

## 2023-06-18 MED ORDER — CLONAZEPAM 1 MG PO TABS
1.0000 mg | ORAL_TABLET | Freq: Four times a day (QID) | ORAL | 0 refills | Status: DC | PRN
  Filled 2023-06-18 (×2): qty 120, 30d supply, fill #0

## 2023-06-18 NOTE — Progress Notes (Signed)
   NURSE VISIT- VAGINITIS/STD  SUBJECTIVE:  Debbie Santana is a 32 y.o. G2P1011 GYN patientfemale here for a vaginal swab for vaginitis screening, STD screen.  She reports the following symptoms:  vaginal discharge, abd cramping  for several  days. Denies abnormal vaginal bleeding, significant pelvic pain, fever, or UTI symptoms.  OBJECTIVE:  There were no vitals taken for this visit.  Appears well, in no apparent distress  ASSESSMENT: Vaginal swab for vaginitis screening & STD screening.  PLAN: Self-collected vaginal probe for Gonorrhea, Chlamydia, Trichomonas, Bacterial Vaginosis, Yeast sent to lab Treatment: to be determined once results are received Follow-up as needed if symptoms persist/worsen, or new symptoms develop  Malachy Mood  06/18/2023 9:46 AM

## 2023-06-19 ENCOUNTER — Other Ambulatory Visit: Payer: Self-pay | Admitting: Adult Health

## 2023-06-19 LAB — CERVICOVAGINAL ANCILLARY ONLY
Bacterial Vaginitis (gardnerella): POSITIVE — AB
Candida Glabrata: NEGATIVE
Candida Vaginitis: NEGATIVE
Chlamydia: NEGATIVE
Comment: NEGATIVE
Comment: NEGATIVE
Comment: NEGATIVE
Comment: NEGATIVE
Comment: NEGATIVE
Comment: NORMAL
Neisseria Gonorrhea: NEGATIVE
Trichomonas: NEGATIVE

## 2023-06-19 MED ORDER — METRONIDAZOLE 500 MG PO TABS: 500.0000 mg | ORAL_TABLET | Freq: Two times a day (BID) | ORAL | 0 refills | Status: DC

## 2023-06-19 NOTE — Progress Notes (Signed)
+  BV on vaginal swab will rx flagyl,no sex or alcohol while taking  ?

## 2023-06-20 DIAGNOSIS — Z79899 Other long term (current) drug therapy: Secondary | ICD-10-CM | POA: Diagnosis not present

## 2023-06-22 ENCOUNTER — Emergency Department (HOSPITAL_COMMUNITY)
Admission: EM | Admit: 2023-06-22 | Discharge: 2023-06-22 | Disposition: A | Discharge status: Home / Self Care | Payer: Medicaid Other | Attending: Emergency Medicine | Admitting: Emergency Medicine

## 2023-06-22 ENCOUNTER — Other Ambulatory Visit: Payer: Self-pay

## 2023-06-22 ENCOUNTER — Encounter (HOSPITAL_COMMUNITY): Payer: Self-pay | Admitting: Emergency Medicine

## 2023-06-22 DIAGNOSIS — G8929 Other chronic pain: Secondary | ICD-10-CM

## 2023-06-22 DIAGNOSIS — M545 Low back pain, unspecified: Secondary | ICD-10-CM | POA: Insufficient documentation

## 2023-06-22 DIAGNOSIS — M5459 Other low back pain: Secondary | ICD-10-CM | POA: Diagnosis not present

## 2023-06-22 HISTORY — DX: Other specified bacterial agents as the cause of diseases classified elsewhere: B96.89

## 2023-06-22 HISTORY — DX: Acute vaginitis: N76.0

## 2023-06-22 MED ORDER — OXYCODONE-ACETAMINOPHEN 5-325 MG PO TABS
2.0000 | ORAL_TABLET | Freq: Once | ORAL | Status: AC
  Administered 2023-06-22: 2 via ORAL
  Filled 2023-06-22: qty 2

## 2023-06-22 MED ORDER — METHOCARBAMOL 500 MG PO TABS
1000.0000 mg | ORAL_TABLET | Freq: Once | ORAL | Status: AC
  Administered 2023-06-22: 1000 mg via ORAL
  Filled 2023-06-22: qty 2

## 2023-06-22 MED ORDER — DEXAMETHASONE SODIUM PHOSPHATE 10 MG/ML IJ SOLN
10.0000 mg | Freq: Once | INTRAMUSCULAR | Status: AC
  Administered 2023-06-22: 10 mg via INTRAMUSCULAR
  Filled 2023-06-22: qty 1

## 2023-06-22 MED ORDER — LIDOCAINE 5 % EX PTCH
1.0000 | MEDICATED_PATCH | CUTANEOUS | Status: DC
  Administered 2023-06-22: 1 via TRANSDERMAL
  Filled 2023-06-22: qty 1

## 2023-06-22 MED ORDER — PREDNISONE 10 MG (21) PO TBPK: ORAL_TABLET | Freq: Every day | ORAL | 0 refills | Status: DC

## 2023-06-22 MED ORDER — LIDOCAINE 5 % EX PTCH: 1.0000 | MEDICATED_PATCH | CUTANEOUS | 0 refills | Status: DC

## 2023-06-22 MED ORDER — KETOROLAC TROMETHAMINE 60 MG/2ML IM SOLN
30.0000 mg | Freq: Once | INTRAMUSCULAR | Status: AC
  Administered 2023-06-22: 30 mg via INTRAMUSCULAR
  Filled 2023-06-22: qty 2

## 2023-06-22 MED ORDER — METHOCARBAMOL 500 MG PO TABS: 1000.0000 mg | ORAL_TABLET | Freq: Three times a day (TID) | ORAL | 0 refills | Status: DC | PRN

## 2023-06-22 NOTE — Discharge Instructions (Signed)
Prescriptions were sent to your pharmacy: -Lidocaine patches can be switched out every 24 hours. -Robaxin is a muscle relaxer.  Take this in place of tizanidine. -Take steroid taper as prescribed.  Follow-up with your spine and pain management doctors for ongoing management.  Return to the emergency department for any new or worsening symptoms of concern.

## 2023-06-22 NOTE — ED Triage Notes (Signed)
Pt c/o lower mid back pain x 1 day. Hx of lumbar surgery in past. Denies any new injury. Nad. Pain radiating into both hips. Denies gu sx's.

## 2023-06-22 NOTE — ED Provider Notes (Signed)
[place] EMERGENCY DEPARTMENT AT [facility] Provider Note   CSN: [id number] Arrival date & time: 06/22/23  1023     History  Chief Complaint  Patient presents with   Back Pain    Debbie Santana is a 32 y.o. female.   Back Pain Patient presents for back pain.  Medical history includes PUD, chronic low back pain, anxiety, depression, GERD, IBS.  She has undergone 2 surgeries to her lumbar spine in the past.  Most recently, she was surgerized 1.5 years ago.  She has undergone spinal injections but none recently.  She is followed by neurosurgeon, Dr. Lovell Sheehan.  For her pain management at home, she takes Percocet and tizanidine.  Last doses of these were this morning at 6 AM.  Patient reports that she lives with chronic back pain but this acutely worsened yesterday.  At the time, she was cleaning her home.  She denies any injuries or straining activities.  Pain has been severe since that time.  She denies any difficulties with urination or bowel movements.  She denies any numbness or weakness to her lower extremities.  She does have worsened pain in her back with walking.    Home Medications Prior to Admission medications   Medication Sig Start Date End Date Taking? Authorizing Provider  lidocaine (LIDODERM) 5 % Place 1 patch onto the skin daily. Remove & Discard patch within 12 hours or as directed by MD 06/22/23  Yes Gloris Manchester, MD  methocarbamol (ROBAXIN) 500 MG tablet Take 2 tablets (1,000 mg total) by mouth every 8 (eight) hours as needed for muscle spasms. 06/22/23  Yes Gloris Manchester, MD  predniSONE (STERAPRED UNI-PAK 21 TAB) 10 MG (21) TBPK tablet Take by mouth daily. Take 6 tabs by mouth daily  for 2 days, then 5 tabs for 2 days, then 4 tabs for 2 days, then 3 tabs for 2 days, 2 tabs for 2 days, then 1 tab by mouth daily for 2 days 06/22/23  Yes Gloris Manchester, MD  Acetaminophen-Caff-Pyrilamine (MIDOL COMPLETE) 500-60-15 MG TABS Take 2 tablets by mouth 3 (three) times daily as  needed (pain.).    [provider]  albuterol (VENTOLIN HFA) 108 (90 Base) MCG/ACT inhaler Inhale 2 puffs into the lungs every 6 (six) hours as needed for wheezing or shortness of breath. 09/11/20   Catarina Hartshorn, MD  butalbital-acetaminophen-caffeine (FIORICET) [phone] MG tablet TAKE 1 TABLET BY MOUTH EVERY 6 HOURS AS NEEDED FOR MIGRAINE HEADACHE 12/04/20   Adline Potter, NP  clonazePAM (KLONOPIN) 1 MG tablet Take 1 tablet (1 mg total) by mouth 4 (four) times daily as needed for anxiety. 06/18/23   Billie Lade, MD  diphenhydrAMINE (BENADRYL) 25 MG tablet Take 25 mg by mouth every 6 (six) hours as needed for allergies.    [provider]  etonogestrel (NEXPLANON) 68 MG IMPL implant 68 mg by Subdermal route once.    [provider]  hyoscyamine (ANASPAZ) 0.125 MG TBDP disintergrating tablet Place 0.125 mg under the tongue every 6 (six) hours as needed for cramping.    [provider]  metroNIDAZOLE (FLAGYL) 500 MG tablet Take 1 tablet (500 mg total) by mouth 2 (two) times daily. 06/19/23   Adline Potter, NP  naloxone [facility]) 2 MG/2ML injection Spray one-half (1 ml) of syringe into each nostril upon signs of opioid overdose. Call 911. May repeat in 3-5 minutes as needed, as directed 01/21/23   [provider]  ondansetron (ZOFRAN) 8 MG tablet  SMARTSIG:1 Tablet(s) By Mouth 1-3 Times Daily 04/07/23   [provider]  oxyCODONE-acetaminophen (PERCOCET) 10-325 MG tablet Take 1 tablet by mouth 3 (three) times daily as needed (pain). 06/10/23   Billie Lade, MD  promethazine (PHENERGAN) 12.5 MG suppository Place 1 suppository (12.5 mg total) rectally every 8 (eight) hours as needed for nausea or vomiting. 06/18/23   Carlan, Chelsea L, NP  Sertraline HCl 200 MG CAPS Take 200 mg by mouth daily after supper.    [provider]  SPIRIVA RESPIMAT 2.5 MCG/ACT AERS Inhale 2 each into the lungs daily as needed (respiratory issues.). 09/24/22    [provider]  sucralfate (CARAFATE) 1 g tablet Take 1 tablet (1 g total) by mouth 3 (three) times daily before meals. 11/06/21   Carlan, Chelsea L, NP  traZODone (DESYREL) 50 MG tablet Take 100 mg by mouth at bedtime. 01/21/23   [provider]  VRAYLAR 1.5 MG capsule Take 1.5 mg by mouth every evening. 09/27/22   [provider]      Allergies    Tape, Buspirone, Fluoxetine, and Nsaids    Review of Systems   Review of Systems  Musculoskeletal:  Positive for back pain.  All other systems reviewed and are negative.   Physical Exam Updated Vital Signs BP 130/77 (BP Location: Right Arm)   Pulse 78   Temp 98 F (36.7 C) (Oral)   Resp 17   LMP 06/22/2023   SpO2 97%  Physical Exam Vitals and nursing note reviewed.  Constitutional:      General: She is not in acute distress.    Appearance: Normal appearance. She is well-developed. She is not ill-appearing, toxic-appearing or diaphoretic.  HENT:     Head: Normocephalic and atraumatic.     Right Ear: External ear normal.     Left Ear: External ear normal.     Nose: Nose normal.     Mouth/Throat:     Mouth: Mucous membranes are moist.  Eyes:     Extraocular Movements: Extraocular movements intact.     Conjunctiva/sclera: Conjunctivae normal.  Cardiovascular:     Rate and Rhythm: Normal rate and regular rhythm.  Pulmonary:     Effort: Pulmonary effort is normal. No respiratory distress.  Abdominal:     General: There is no distension.     Palpations: Abdomen is soft.     Tenderness: There is no abdominal tenderness.  Musculoskeletal:        General: Tenderness present. No swelling or deformity. Normal range of motion.     Cervical back: Normal range of motion and neck supple.     Right lower leg: No edema.     Left lower leg: No edema.  Skin:    General: Skin is warm and dry.     Coloration: Skin is not jaundiced or pale.  Neurological:     General: No focal deficit present.     Mental Status:  She is alert and oriented to person, place, and time.     Sensory: No sensory deficit.     Motor: No weakness.  Psychiatric:        Mood and Affect: Mood normal.        Behavior: Behavior normal.     ED Results / Procedures / Treatments   Labs (all labs ordered are listed, but only abnormal results are displayed) Labs Reviewed - No data to display  EKG None  Radiology No results found.  Procedures Procedures  Medications Ordered in ED Medications  lidocaine (LIDODERM) 5 % 1 patch (1 patch Transdermal Patch Applied 06/22/23 1113)  methocarbamol (ROBAXIN) tablet 1,000 mg (1,000 mg Oral Given 06/22/23 1108)  ketorolac (TORADOL) injection 30 mg (30 mg Intramuscular Given 06/22/23 1112)  oxyCODONE-acetaminophen (PERCOCET/ROXICET) 5-325 MG per tablet 2 tablet (2 tablets Oral Given 06/22/23 1108)  dexamethasone (DECADRON) injection 10 mg (10 mg Intramuscular Given 06/22/23 1111)    ED Course/ Medical Decision Making/ A&P                                 Medical Decision Making Risk Prescription drug management.   Patient presenting for acute on chronic low back pain.  She denies any recent red flag symptoms.  She has no neurologic deficits on exam.  She does have tenderness to area of lumbar spine, particular in the paraspinous areas.  Pain has been refractory to her home medications of Percocet and tizanidine.  Multimodal pain control was ordered here in the ED. following this, she did have improved symptoms.  She was given new prescriptions to maintain multimodal pain control at home.  Patient to follow-up with her spine and pain management doctors as an outpatient.  She was discharged in good condition.        Final Clinical Impression(s) / ED Diagnoses Final diagnoses:  Chronic bilateral low back pain without sciatica    Rx / DC Orders ED Discharge Orders          Ordered    lidocaine (LIDODERM) 5 %  Every 24 hours        06/22/23 1256    methocarbamol (ROBAXIN)  500 MG tablet  Every 8 hours PRN        06/22/23 1256    predniSONE (STERAPRED UNI-PAK 21 TAB) 10 MG (21) TBPK tablet  Daily        06/22/23 1256              Gloris Manchester, MD 06/22/23 1257

## 2023-06-23 ENCOUNTER — Telehealth: Payer: Self-pay | Admitting: Internal Medicine

## 2023-06-23 DIAGNOSIS — M5116 Intervertebral disc disorders with radiculopathy, lumbar region: Secondary | ICD-10-CM

## 2023-06-23 NOTE — Telephone Encounter (Signed)
Patient called need med refill no longer get from her pcp.   Rx #: [id number]  clonazePAM (KLONOPIN) 1 MG tablet  (tid)  [facility]: [facility]

## 2023-06-23 NOTE — Telephone Encounter (Signed)
10 day supply until she gets into pain management of October 19th.    oxyCODONE-acetaminophen (PERCOCET) 10-325 MG tablet [[id number]]   Pharmacy: [facility]

## 2023-06-23 NOTE — Telephone Encounter (Signed)
Refills were sent on 06/18/2023

## 2023-06-24 ENCOUNTER — Encounter: Payer: Self-pay | Admitting: Internal Medicine

## 2023-06-24 MED ORDER — OXYCODONE-ACETAMINOPHEN 10-325 MG PO TABS: 1.0000 | ORAL_TABLET | Freq: Four times a day (QID) | ORAL | 0 refills | Status: DC | PRN

## 2023-06-24 MED ORDER — CLONAZEPAM 1 MG PO TABS: 1.0000 mg | ORAL_TABLET | Freq: Four times a day (QID) | ORAL | 0 refills | Status: DC | PRN

## 2023-06-24 NOTE — Telephone Encounter (Signed)
Pt calling says she supposedly did not get a 30 day supply of pain meds and her Klonopin was called in to wrong pharmacy- says she uses [facility] in [place] not [facility]. Please advise Thank you

## 2023-06-30 ENCOUNTER — Ambulatory Visit: Payer: Medicaid Other | Admitting: Psychiatry

## 2023-07-05 ENCOUNTER — Encounter: Payer: Self-pay | Admitting: Internal Medicine

## 2023-07-05 DIAGNOSIS — M5116 Intervertebral disc disorders with radiculopathy, lumbar region: Secondary | ICD-10-CM

## 2023-07-08 MED ORDER — OXYCODONE-ACETAMINOPHEN 10-325 MG PO TABS
1.0000 | ORAL_TABLET | Freq: Four times a day (QID) | ORAL | 0 refills | Status: AC | PRN
Start: 2023-07-08 — End: 2023-07-28

## 2023-07-10 MED ORDER — METHOCARBAMOL 500 MG PO TABS: 1000.0000 mg | ORAL_TABLET | Freq: Three times a day (TID) | ORAL | 0 refills | Status: DC | PRN

## 2023-07-10 NOTE — Addendum Note (Signed)
Addended by: Christel Mormon E on: 07/10/2023 08:15 AM   Modules accepted: Orders

## 2023-07-12 DIAGNOSIS — E785 Hyperlipidemia, unspecified: Secondary | ICD-10-CM | POA: Diagnosis not present

## 2023-07-12 DIAGNOSIS — M4184 Other forms of scoliosis, thoracic region: Secondary | ICD-10-CM | POA: Diagnosis not present

## 2023-07-12 DIAGNOSIS — M4302 Spondylolysis, cervical region: Secondary | ICD-10-CM | POA: Diagnosis not present

## 2023-07-12 DIAGNOSIS — Z79891 Long term (current) use of opiate analgesic: Secondary | ICD-10-CM | POA: Diagnosis not present

## 2023-07-12 DIAGNOSIS — E538 Deficiency of other specified B group vitamins: Secondary | ICD-10-CM | POA: Diagnosis not present

## 2023-07-12 DIAGNOSIS — M47819 Spondylosis without myelopathy or radiculopathy, site unspecified: Secondary | ICD-10-CM | POA: Diagnosis not present

## 2023-07-12 DIAGNOSIS — F17210 Nicotine dependence, cigarettes, uncomplicated: Secondary | ICD-10-CM | POA: Diagnosis not present

## 2023-07-12 DIAGNOSIS — Z6826 Body mass index (BMI) 26.0-26.9, adult: Secondary | ICD-10-CM | POA: Diagnosis not present

## 2023-07-12 DIAGNOSIS — Z3200 Encounter for pregnancy test, result unknown: Secondary | ICD-10-CM | POA: Diagnosis not present

## 2023-07-12 DIAGNOSIS — F17200 Nicotine dependence, unspecified, uncomplicated: Secondary | ICD-10-CM | POA: Diagnosis not present

## 2023-07-12 DIAGNOSIS — E611 Iron deficiency: Secondary | ICD-10-CM | POA: Diagnosis not present

## 2023-07-13 ENCOUNTER — Encounter: Payer: Self-pay | Admitting: Internal Medicine

## 2023-07-14 NOTE — Telephone Encounter (Signed)
Patient called needs this done today-piro authorization oxyCODONE-acetaminophen (PERCOCET) 10-325 MG tablet

## 2023-07-14 NOTE — Telephone Encounter (Signed)
Attempt to submite PA- [facility] is unable to respond with clinical questions. Please see more information at the bottom of the page for next steps.

## 2023-07-15 ENCOUNTER — Ambulatory Visit: Payer: Medicaid Other | Admitting: Adult Health

## 2023-07-15 ENCOUNTER — Encounter: Payer: Self-pay | Admitting: Adult Health

## 2023-07-15 ENCOUNTER — Other Ambulatory Visit (HOSPITAL_COMMUNITY)
Admission: RE | Admit: 2023-07-15 | Discharge: 2023-07-15 | Disposition: A | Discharge status: Home / Self Care | Payer: Medicaid Other | Source: Ambulatory Visit | Attending: Adult Health | Admitting: Adult Health

## 2023-07-15 VITALS — BP 114/68 | HR 82 | Ht 68.0 in | Wt 172.0 lb

## 2023-07-15 DIAGNOSIS — Z124 Encounter for screening for malignant neoplasm of cervix: Secondary | ICD-10-CM | POA: Diagnosis not present

## 2023-07-15 DIAGNOSIS — Z3202 Encounter for pregnancy test, result negative: Secondary | ICD-10-CM | POA: Insufficient documentation

## 2023-07-15 DIAGNOSIS — R32 Unspecified urinary incontinence: Secondary | ICD-10-CM

## 2023-07-15 DIAGNOSIS — N3945 Continuous leakage: Secondary | ICD-10-CM | POA: Diagnosis not present

## 2023-07-15 DIAGNOSIS — B9689 Other specified bacterial agents as the cause of diseases classified elsewhere: Secondary | ICD-10-CM | POA: Insufficient documentation

## 2023-07-15 DIAGNOSIS — N926 Irregular menstruation, unspecified: Secondary | ICD-10-CM | POA: Diagnosis not present

## 2023-07-15 DIAGNOSIS — N760 Acute vaginitis: Secondary | ICD-10-CM | POA: Diagnosis not present

## 2023-07-15 DIAGNOSIS — R87612 Low grade squamous intraepithelial lesion on cytologic smear of cervix (LGSIL): Secondary | ICD-10-CM | POA: Diagnosis not present

## 2023-07-15 DIAGNOSIS — Z975 Presence of (intrauterine) contraceptive device: Secondary | ICD-10-CM | POA: Diagnosis not present

## 2023-07-15 LAB — POCT URINALYSIS DIPSTICK
Blood, UA: NEGATIVE
Glucose, UA: NEGATIVE
Ketones, UA: NEGATIVE
Leukocytes, UA: NEGATIVE
Nitrite, UA: NEGATIVE
Protein, UA: NEGATIVE

## 2023-07-15 LAB — POCT URINE PREGNANCY: Preg Test, Ur: NEGATIVE

## 2023-07-15 MED ORDER — SOLIFENACIN SUCCINATE 10 MG PO TABS: 10.0000 mg | ORAL_TABLET | Freq: Every day | ORAL | 3 refills | Status: DC

## 2023-07-15 NOTE — Progress Notes (Signed)
Subjective:     Patient ID: Debbie Santana, female   DOB: 16-Feb-1991, 32 y.o.   MRN: [id number]  HPI Debbie Santana is a 32 year old white female,single, G2P1011, in complaining of irregular bleeding with nexplanon and white discharge in urine and leaking urine for about 3 weeks. She needs a pap, too.  PCP is Dr Durwin Nora.  Review of Systems + irregular bleeding with nexplanon  +white discharge in urine + leaking urine for about 3 weeks.   Denies any pain or burning Reviewed past medical,surgical, social and family history. Reviewed medications and allergies.  Objective:   Physical Exam BP 114/68 (BP Location: Left Arm, Patient Position: Sitting, Cuff Size: Normal)   Pulse 82   Ht 5\' 8"  (1.727 m)   Wt 172 lb (78 kg)   LMP 07/07/2023 (Approximate)   BMI 26.15 kg/m  UPT is negative. Urine dipstick was negative  Skin warm and dry.Pelvic: external genitalia is normal in appearance no lesions, vagina: white discharge without odor,urethra has no lesions or masses noted, cervix: bulbous,pap with GC/CHL and HR HPV genotyping performed, uterus: normal size, shape and contour, non tender, no masses felt, adnexa: no masses or tenderness noted. Bladder is non tender and no masses felt.  Upstream - 07/15/23 1109       Pregnancy Intention Screening   Does the patient want to become pregnant in the next year? No    Does the patient's partner want to become pregnant in the next year? No    Would the patient like to discuss contraceptive options today? No      Contraception Wrap Up   Current Method Hormonal Implant    End Method Hormonal Implant    Contraception Counseling Provided Yes            Examination chaperoned by Malachy Mood LPN     Assessment:     1. Routine Papanicolaou smear Pap sent  - Cytology - PAP( [facility])  2. Urinary incontinence, unspecified type Urine dipstick negative  - POCT Urinalysis Dipstick  3. Pregnancy examination or test, negative result - POCT urine  pregnancy  4. Continuous urine leakage Has been leaking for 3 weeks, can not til before it happens Try kegel's Will rx vesicare 10 mg 1 daily Decrease caffeine Meds ordered this encounter  Medications   solifenacin (VESICARE) 10 MG tablet    Sig: Take 1 tablet (10 mg total) by mouth daily.    Dispense:  30 tablet    Refill:  3    Order Specific Question:   Supervising Provider    Answer:   Duane Lope H [[id number]]   Follow up in 3 weeks if persists will refer to urology   5. Irregular bleeding Had some irregular bleeding, usually period regular  Keep log of this  6. Nexplanon in place     Plan:     Follow up in 3 weeks with me for ROS

## 2023-07-17 DIAGNOSIS — Z79899 Other long term (current) drug therapy: Secondary | ICD-10-CM | POA: Diagnosis not present

## 2023-07-21 ENCOUNTER — Other Ambulatory Visit: Payer: Self-pay | Admitting: Adult Health

## 2023-07-21 ENCOUNTER — Encounter: Payer: Self-pay | Admitting: Adult Health

## 2023-07-21 DIAGNOSIS — R87612 Low grade squamous intraepithelial lesion on cytologic smear of cervix (LGSIL): Secondary | ICD-10-CM | POA: Insufficient documentation

## 2023-07-21 LAB — CYTOLOGY - PAP
Chlamydia: NEGATIVE
Comment: NEGATIVE
Comment: NEGATIVE
Comment: NORMAL
High risk HPV: NEGATIVE
Neisseria Gonorrhea: NEGATIVE

## 2023-07-21 MED ORDER — METRONIDAZOLE 500 MG PO TABS
500.0000 mg | ORAL_TABLET | Freq: Two times a day (BID) | ORAL | 0 refills | Status: DC
Start: 2023-07-21 — End: 2023-08-05

## 2023-07-21 NOTE — Progress Notes (Signed)
+  BV on pap will rx flagyl, no sex or alcohol while taking meds

## 2023-07-23 ENCOUNTER — Other Ambulatory Visit: Payer: Self-pay | Admitting: Internal Medicine

## 2023-07-24 MED ORDER — CLONAZEPAM 1 MG PO TABS: 1.0000 mg | ORAL_TABLET | Freq: Four times a day (QID) | ORAL | 0 refills | Status: DC | PRN

## 2023-07-27 ENCOUNTER — Other Ambulatory Visit (INDEPENDENT_AMBULATORY_CARE_PROVIDER_SITE_OTHER): Payer: Self-pay | Admitting: Gastroenterology

## 2023-07-28 NOTE — Telephone Encounter (Signed)
Last visit 5.14.24

## 2023-07-30 ENCOUNTER — Encounter: Payer: Self-pay | Admitting: Women's Health

## 2023-07-30 ENCOUNTER — Ambulatory Visit (INDEPENDENT_AMBULATORY_CARE_PROVIDER_SITE_OTHER): Payer: Medicaid Other | Admitting: Women's Health

## 2023-07-30 ENCOUNTER — Other Ambulatory Visit (HOSPITAL_COMMUNITY)
Admission: RE | Admit: 2023-07-30 | Discharge: 2023-07-30 | Disposition: A | Discharge status: Home / Self Care | Payer: Medicaid Other | Source: Ambulatory Visit | Attending: Women's Health | Admitting: Women's Health

## 2023-07-30 ENCOUNTER — Encounter: Payer: Self-pay | Admitting: Internal Medicine

## 2023-07-30 VITALS — BP 117/77 | HR 103 | Ht 68.0 in | Wt 169.0 lb

## 2023-07-30 DIAGNOSIS — N870 Mild cervical dysplasia: Secondary | ICD-10-CM | POA: Diagnosis not present

## 2023-07-30 DIAGNOSIS — R87612 Low grade squamous intraepithelial lesion on cytologic smear of cervix (LGSIL): Secondary | ICD-10-CM | POA: Diagnosis not present

## 2023-07-30 NOTE — Patient Instructions (Signed)
Colposcopy, Care After  The following information offers guidance on how to care for yourself after your procedure. Your health care provider may also give you more specific instructions. If you have problems or questions, contact your health care provider. What can I expect after the procedure? If you had a colposcopy without a biopsy, you can expect to feel fine right away after your procedure. However, you may have some spotting of blood for a few days. You can return to your normal activities. If you had a colposcopy with a biopsy, it is common after the procedure to have: Soreness and mild pain. These may last for a few days. Mild vaginal bleeding or discharge that is dark-colored and grainy. This may last for a few days. The discharge may be caused by a liquid (solution) that was used during the procedure. You may need to wear a sanitary pad during this time. Spotting of blood for at least 48 hours after the procedure. Follow these instructions at home: Medicines Take over-the-counter and prescription medicines only as told by your health care provider. Talk with your health care provider about what type of over-the-counter pain medicines and prescription medicines you can start to take again. It is especially important to talk with your health care provider if you take blood thinners. Activity Avoid using douche products, using tampons, and having sex for at least 3 days after the procedure or for as long as told by your health care provider. Return to your normal activities as told by your health care provider. Ask your health care provider what activities are safe for you. General instructions Ask your health care provider if you may take baths, swim, or use a hot tub. You may take showers. If you use birth control (contraception), continue to use it. Keep all follow-up visits. This is important. Contact a health care provider if: You have a fever or chills. You faint or feel  light-headed. Get help right away if: You have heavy bleeding from your vagina or pass blood clots. Heavy bleeding is bleeding that soaks through a sanitary pad in less than 1 hour. You have vaginal discharge that is abnormal, is yellow in color, or smells bad. This could be a sign of infection. You have severe pain or cramps in your lower abdomen that do not go away with medicine. Summary If you had a colposcopy without a biopsy, you can expect to feel fine right away, but you may have some spotting of blood for a few days. You can return to your normal activities. If you had a colposcopy with a biopsy, it is common to have mild pain for a few days and spotting for 48 hours after the procedure. Avoid using douche products, using tampons, and having sex for at least 3 days after the procedure or for as long as told by your health care provider. Get help right away if you have heavy bleeding, severe pain, or signs of infection. This information is not intended to replace advice given to you by your health care provider. Make sure you discuss any questions you have with your health care provider. Document Revised: 02/04/2021 Document Reviewed: 02/04/2021 Elsevier Patient Education  2024 Elsevier Inc.  

## 2023-07-30 NOTE — Addendum Note (Signed)
Addended by: Freddie Apley R on: 07/30/2023 04:20 PM   Modules accepted: Orders

## 2023-07-30 NOTE — Progress Notes (Signed)
   COLPOSCOPY PROCEDURE NOTE Patient name: Debbie Santana MRN [id number]  Date of birth: Jul 23, 1991 Subjective Findings:   Debbie Santana is a 32 y.o. G36P1011 Caucasian female being seen today for a colposcopy. 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 Contraception: Nexplanon. Menopausal: no. Hysterectomy: no.   Smoker: yes. Immunocompromised: no.   The risks and benefits were explained and informed consent was obtained, and written copy is in chart. Pertinent History Reviewed:   Reviewed past medical,surgical, social, obstetrical and family history.  Reviewed problem list, medications and allergies. Objective Findings & Procedure:   Vitals:   07/30/23 1540  BP: 117/77  Pulse: (!) 103  Weight: 169 lb (76.7 kg)  Height: 5\' 8"  (1.727 m)  Body mass index is 25.7 kg/m.  No results found for this or any previous visit (from the past 24 hour(s)).   Time out was performed.  Speculum placed in the vagina, cervix fully visualized. SCJ: not fully visualized. Cervix swabbed x 3 with acetic acid.  Acetowhitening present: Yes Cervix: no visible lesions, no mosaicism, no punctation, no abnormal vasculature, and acetowhite lesion(s) noted at 12 o'clock. Endocervical curettage performed, Cervical biopsies taken at 12 o'clock, and Hemostasis achieved with Monsel's solution. Vagina: vaginal colposcopy not performed Vulva: vulvar colposcopy not performed  Specimens: 2  Complications: none  Chaperone:  Freddie Apley     Colposcopic Impression & Plan:   Colposcopy findings consistent with LSIL Plan: Post biopsy instructions given, Will notify patient of  results when back, and Will base plan of care on pathology results and ASCCP guidelines  Return in about 1 year (around 07/29/2024) for Pap & physical.  Cheral Marker CNM, [facility] 07/30/2023 4:13 PM

## 2023-07-31 ENCOUNTER — Telehealth: Payer: Self-pay

## 2023-07-31 MED ORDER — OXYCODONE-ACETAMINOPHEN 10-325 MG PO TABS: 1.0000 | ORAL_TABLET | Freq: Four times a day (QID) | ORAL | 0 refills | Status: DC | PRN

## 2023-07-31 NOTE — Telephone Encounter (Signed)
Copied from CRM [id number]. Topic: Clinical - Medication Refill >> Jul 31, 2023 12:32 PM Adelina Mings wrote: Most Recent Primary Care Visit:  Provider: Christel Mormon E  Department: [facility]  Visit Type: NEW PATIENT  Date: 05/01/2023  Medication: oxcycodone  Has the patient contacted their pharmacy? Yes (Agent: If no, request that the patient contact the pharmacy for the refill. If patient does not wish to contact the pharmacy document the reason why and proceed with request.) (Agent: If yes, when and what did the pharmacy advise?) They said it was canceled   Is this the correct pharmacy for this prescription? Yes If no, delete pharmacy and type the correct one.  This is the patient's preferred pharmacy:  [facility] [address], [place] - [address] [place] [address] [place] [address] Phone: [phone] Fax: [phone]   Has the prescription been filled recently? No  Is the patient out of the medication? No  Has the patient been seen for an appointment in the last year OR does the patient have an upcoming appointment? Yes  Can we respond through MyChart? No account say its deactivated  Agent: Please be advised that Rx refills may take up to 3 business days. We ask that you follow-up with your pharmacy.

## 2023-07-31 NOTE — Telephone Encounter (Signed)
Patient stated pain meds were canceled ?

## 2023-08-01 LAB — SURGICAL PATHOLOGY

## 2023-08-05 ENCOUNTER — Ambulatory Visit: Payer: Medicaid Other | Admitting: Adult Health

## 2023-08-05 ENCOUNTER — Encounter: Payer: Self-pay | Admitting: Adult Health

## 2023-08-05 ENCOUNTER — Other Ambulatory Visit (HOSPITAL_COMMUNITY)
Admission: RE | Admit: 2023-08-05 | Discharge: 2023-08-05 | Disposition: A | Discharge status: Home / Self Care | Payer: Medicaid Other | Source: Ambulatory Visit | Attending: Adult Health | Admitting: Adult Health

## 2023-08-05 VITALS — BP 122/68 | HR 100 | Ht 68.0 in | Wt 178.0 lb

## 2023-08-05 DIAGNOSIS — B009 Herpesviral infection, unspecified: Secondary | ICD-10-CM

## 2023-08-05 DIAGNOSIS — Z113 Encounter for screening for infections with a predominantly sexual mode of transmission: Secondary | ICD-10-CM | POA: Insufficient documentation

## 2023-08-05 DIAGNOSIS — N898 Other specified noninflammatory disorders of vagina: Secondary | ICD-10-CM

## 2023-08-05 DIAGNOSIS — R32 Unspecified urinary incontinence: Secondary | ICD-10-CM | POA: Diagnosis not present

## 2023-08-05 LAB — POCT URINALYSIS DIPSTICK OB
Blood, UA: NEGATIVE
Glucose, UA: NEGATIVE
Nitrite, UA: NEGATIVE

## 2023-08-05 MED ORDER — VALACYCLOVIR HCL 1 G PO TABS: 1000.0000 mg | ORAL_TABLET | Freq: Two times a day (BID) | ORAL | 3 refills | Status: AC

## 2023-08-05 NOTE — Progress Notes (Signed)
Subjective:     Patient ID: Debbie Santana, female   DOB: 03/10/1991, 32 y.o.   MRN: [id number]  HPI Debbie Santana is a 32 year old white female,single, G2P1011, in complaining of green discharge, no odor and vaginal irritation and has herpes outbreak, no more UI.     Component Value Date/Time   DIAGPAP - Low grade squamous intraepithelial lesion (LSIL) (A) 07/15/2023 1112   DIAGPAP (A) 06/13/2022 1453    - Atypical squamous cells of undetermined significance (ASC-US)   DIAGPAP (A) 09/05/2021 1037    - Atypical squamous cells of undetermined significance (ASC-US)   HPVHIGH Negative 07/15/2023 1112   HPVHIGH Negative 06/13/2022 1453   HPVHIGH Negative 09/05/2021 1037   ADEQPAP  07/15/2023 1112    Satisfactory for evaluation; transformation zone component PRESENT.   ADEQPAP  06/13/2022 1453    Satisfactory for evaluation; transformation zone component PRESENT.   ADEQPAP  09/05/2021 1037    Satisfactory for evaluation; transformation zone component PRESENT.    PCP is Dr Durwin Nora.  Review of Systems +green discharge, no odor  +vaginal irritation  + herpes outbreak,  no more UI.   Reviewed past medical,surgical, social and family history. Reviewed medications and allergies.  Objective:   Physical Exam BP 122/68 (BP Location: Left Arm, Patient Position: Sitting, Cuff Size: Normal)   Pulse 100   Ht 5\' 8"  (1.727 m)   Wt 178 lb (80.7 kg)   LMP 07/15/2023 (Exact Date)   BMI 27.06 kg/m  urine dipstick trace leuks    Skin warm and dry.Pelvic: external genitalia is normal in appearance, has vesicle left labia, vagina: white discharge without odor,urethra has no lesions or masses noted, cervix:smooth and bulbous, uterus: normal size, shape and contour, non tender, no masses felt, adnexa: no masses or tenderness noted. Bladder is non tender and no masses felt. CV swab obtained. Fall risk is moderate  Upstream - 08/05/23 1024       Pregnancy Intention Screening   Does the patient want to become  pregnant in the next year? No    Does the patient's partner want to become pregnant in the next year? No    Would the patient like to discuss contraceptive options today? No      Contraception Wrap Up   Current Method Hormonal Implant    End Method Hormonal Implant    Contraception Counseling Provided No            Examination chaperoned by Freddie Apley RN   Assessment:     1. Vaginal discharge CV swab sent  - POC Urinalysis Dipstick OB - Cervicovaginal ancillary only( [place])  2. Vaginal irritation CV swab sent  - POC Urinalysis Dipstick OB - Cervicovaginal ancillary only( [place])  3. Herpes +vesicle left labia Will rx valtrex 1 gm 1 bid x 10 days Meds ordered this encounter  Medications   valACYclovir (VALTREX) 1000 MG tablet    Sig: Take 1 tablet (1,000 mg total) by mouth 2 (two) times daily.    Dispense:  20 tablet    Refill:  3    Order Specific Question:   Supervising Provider    Answer:   [name] Hidden, LUTHER H [[id number]]     4. Screening examination for STD (sexually transmitted disease) CV swab sent for GC/CHL,trich, BV and yeast  Check HIV and RPR - HIV Antibody (routine testing w rflx) - RPR - Cervicovaginal ancillary only( [place])  5. Urinary incontinence, unspecified type Has resolved    Plan:  Follow up prn

## 2023-08-06 ENCOUNTER — Other Ambulatory Visit: Payer: Self-pay | Admitting: Adult Health

## 2023-08-06 LAB — CERVICOVAGINAL ANCILLARY ONLY
Bacterial Vaginitis (gardnerella): NEGATIVE
Candida Glabrata: POSITIVE — AB
Candida Vaginitis: NEGATIVE
Chlamydia: NEGATIVE
Comment: NEGATIVE
Comment: NEGATIVE
Comment: NEGATIVE
Comment: NEGATIVE
Comment: NEGATIVE
Comment: NORMAL
Neisseria Gonorrhea: NEGATIVE
Trichomonas: NEGATIVE

## 2023-08-06 MED ORDER — FLUCONAZOLE 150 MG PO TABS: ORAL_TABLET | ORAL | 1 refills | Status: DC

## 2023-08-06 NOTE — Progress Notes (Signed)
+  candida glabrata on vaginal swab will rx diflucan

## 2023-08-09 DIAGNOSIS — Z3200 Encounter for pregnancy test, result unknown: Secondary | ICD-10-CM | POA: Diagnosis not present

## 2023-08-09 DIAGNOSIS — Z6826 Body mass index (BMI) 26.0-26.9, adult: Secondary | ICD-10-CM | POA: Diagnosis not present

## 2023-08-09 DIAGNOSIS — M47819 Spondylosis without myelopathy or radiculopathy, site unspecified: Secondary | ICD-10-CM | POA: Diagnosis not present

## 2023-08-09 DIAGNOSIS — E538 Deficiency of other specified B group vitamins: Secondary | ICD-10-CM | POA: Diagnosis not present

## 2023-08-09 DIAGNOSIS — F17210 Nicotine dependence, cigarettes, uncomplicated: Secondary | ICD-10-CM | POA: Diagnosis not present

## 2023-08-09 DIAGNOSIS — Z79891 Long term (current) use of opiate analgesic: Secondary | ICD-10-CM | POA: Diagnosis not present

## 2023-08-09 DIAGNOSIS — F17200 Nicotine dependence, unspecified, uncomplicated: Secondary | ICD-10-CM | POA: Diagnosis not present

## 2023-08-09 DIAGNOSIS — M4184 Other forms of scoliosis, thoracic region: Secondary | ICD-10-CM | POA: Diagnosis not present

## 2023-08-09 DIAGNOSIS — E611 Iron deficiency: Secondary | ICD-10-CM | POA: Diagnosis not present

## 2023-08-09 DIAGNOSIS — M4302 Spondylolysis, cervical region: Secondary | ICD-10-CM | POA: Diagnosis not present

## 2023-08-09 DIAGNOSIS — E785 Hyperlipidemia, unspecified: Secondary | ICD-10-CM | POA: Diagnosis not present

## 2023-08-10 ENCOUNTER — Other Ambulatory Visit: Payer: Self-pay | Admitting: Internal Medicine

## 2023-08-11 ENCOUNTER — Other Ambulatory Visit (INDEPENDENT_AMBULATORY_CARE_PROVIDER_SITE_OTHER): Payer: Self-pay | Admitting: Gastroenterology

## 2023-08-11 MED ORDER — PROMETHAZINE HCL 12.5 MG RE SUPP: 12.5000 mg | Freq: Three times a day (TID) | RECTAL | 1 refills | Status: DC | PRN

## 2023-08-11 MED ORDER — METHOCARBAMOL 500 MG PO TABS: 1000.0000 mg | ORAL_TABLET | Freq: Three times a day (TID) | ORAL | 0 refills | Status: DC | PRN

## 2023-08-13 DIAGNOSIS — Z79899 Other long term (current) drug therapy: Secondary | ICD-10-CM | POA: Diagnosis not present

## 2023-08-20 ENCOUNTER — Other Ambulatory Visit: Payer: Self-pay | Admitting: Internal Medicine

## 2023-08-24 ENCOUNTER — Other Ambulatory Visit: Payer: Self-pay | Admitting: Internal Medicine

## 2023-08-25 ENCOUNTER — Ambulatory Visit: Payer: Medicaid Other | Admitting: Adult Health

## 2023-08-25 ENCOUNTER — Other Ambulatory Visit: Payer: Self-pay | Admitting: Adult Health

## 2023-08-25 MED ORDER — MEGESTROL ACETATE 40 MG PO TABS: ORAL_TABLET | ORAL | 1 refills | Status: DC

## 2023-08-25 MED ORDER — CLONAZEPAM 1 MG PO TABS: 1.0000 mg | ORAL_TABLET | Freq: Four times a day (QID) | ORAL | 0 refills | Status: DC | PRN
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
## 2023-09-07 DIAGNOSIS — E785 Hyperlipidemia, unspecified: Secondary | ICD-10-CM | POA: Diagnosis not present

## 2023-09-07 DIAGNOSIS — F17210 Nicotine dependence, cigarettes, uncomplicated: Secondary | ICD-10-CM | POA: Diagnosis not present

## 2023-09-07 DIAGNOSIS — M4302 Spondylolysis, cervical region: Secondary | ICD-10-CM | POA: Diagnosis not present

## 2023-09-07 DIAGNOSIS — Z3200 Encounter for pregnancy test, result unknown: Secondary | ICD-10-CM | POA: Diagnosis not present

## 2023-09-07 DIAGNOSIS — M4184 Other forms of scoliosis, thoracic region: Secondary | ICD-10-CM | POA: Diagnosis not present

## 2023-09-07 DIAGNOSIS — Z79891 Long term (current) use of opiate analgesic: Secondary | ICD-10-CM | POA: Diagnosis not present

## 2023-09-07 DIAGNOSIS — E538 Deficiency of other specified B group vitamins: Secondary | ICD-10-CM | POA: Diagnosis not present

## 2023-09-07 DIAGNOSIS — E611 Iron deficiency: Secondary | ICD-10-CM | POA: Diagnosis not present

## 2023-09-07 DIAGNOSIS — R030 Elevated blood-pressure reading, without diagnosis of hypertension: Secondary | ICD-10-CM | POA: Diagnosis not present

## 2023-09-07 DIAGNOSIS — M47819 Spondylosis without myelopathy or radiculopathy, site unspecified: Secondary | ICD-10-CM | POA: Diagnosis not present

## 2023-09-07 DIAGNOSIS — Z6826 Body mass index (BMI) 26.0-26.9, adult: Secondary | ICD-10-CM | POA: Diagnosis not present

## 2023-09-07 DIAGNOSIS — F17200 Nicotine dependence, unspecified, uncomplicated: Secondary | ICD-10-CM | POA: Diagnosis not present

## 2023-09-13 ENCOUNTER — Emergency Department (HOSPITAL_COMMUNITY)
Admission: EM | Admit: 2023-09-13 | Discharge: 2023-09-13 | Disposition: A | Discharge status: Home / Self Care | Payer: Medicaid Other | Attending: Emergency Medicine | Admitting: Emergency Medicine

## 2023-09-13 ENCOUNTER — Encounter (HOSPITAL_COMMUNITY): Payer: Self-pay

## 2023-09-13 ENCOUNTER — Other Ambulatory Visit: Payer: Self-pay

## 2023-09-13 DIAGNOSIS — N751 Abscess of Bartholin's gland: Secondary | ICD-10-CM | POA: Insufficient documentation

## 2023-09-13 MED ORDER — HYDROCODONE-ACETAMINOPHEN 5-325 MG PO TABS
1.0000 | ORAL_TABLET | Freq: Once | ORAL | Status: AC
  Administered 2023-09-13: 1 via ORAL
  Filled 2023-09-13: qty 1

## 2023-09-13 MED ORDER — SULFAMETHOXAZOLE-TRIMETHOPRIM 800-160 MG PO TABS
1.0000 | ORAL_TABLET | Freq: Once | ORAL | Status: AC
  Administered 2023-09-13: 1 via ORAL
  Filled 2023-09-13: qty 1

## 2023-09-13 MED ORDER — LIDOCAINE HCL (PF) 1 % IJ SOLN
5.0000 mL | Freq: Once | INTRAMUSCULAR | Status: AC
  Administered 2023-09-13: 5 mL
  Filled 2023-09-13: qty 5

## 2023-09-13 MED ORDER — SULFAMETHOXAZOLE-TRIMETHOPRIM 800-160 MG PO TABS: 1.0000 | ORAL_TABLET | Freq: Two times a day (BID) | ORAL | 0 refills | Status: AC

## 2023-09-13 NOTE — Discharge Instructions (Signed)
Take the entire course of the antibiotics prescribed.  You will also need to apply warm water compresses or a gentle shower water treatment to the site twice daily to keep the site open and draining.  I cannot prescribe any additional pain medicine since you have an active prescription for oxycodone - but you may use this for your abscess site pain.

## 2023-09-13 NOTE — ED Provider Notes (Signed)
[place] [facility]PARTMENT AT [facility] Provider Note   CSN: [id number] Arrival date & time: 09/13/23  1953     History  Chief Complaint  Patient presents with   Groin Swelling    Debbie Santana is a 32 y.o. female with a history of prior Bartholin glands abscesses presenting for evaluation of a left-sided Bartholin's swelling and pain which slowly worsened over the past 2 days.  She has seen Dr. [name] Hidden in the past for similar episodes, was told that ultimately he would like to completely excise the glans since this is a frequent problem for her.  She denies fevers or chills, no other complaints, there have been no drainage from the site despite warm compresses.  The history is provided by the patient.       Home Medications Prior to Admission medications   Medication Sig Start Date End Date Taking? Authorizing Provider  fluconazole (DIFLUCAN) 150 MG tablet Take 1 now and repeat 1 in 3 days and again 3 days after that 08/06/23   Adline Potter, NP  sulfamethoxazole-trimethoprim (BACTRIM DS) 800-160 MG tablet Take 1 tablet by mouth 2 (two) times daily for 7 days. 09/13/23 09/20/23 Yes Lawson Mahone, Raynelle Fanning, PA-C  Acetaminophen-Caff-Pyrilamine (MIDOL COMPLETE) 500-60-15 MG TABS Take 2 tablets by mouth 3 (three) times daily as needed (pain.).    [provider]  albuterol (VENTOLIN HFA) 108 (90 Base) MCG/ACT inhaler Inhale 2 puffs into the lungs every 6 (six) hours as needed for wheezing or shortness of breath. 09/11/20   Catarina Hartshorn, MD  butalbital-acetaminophen-caffeine (FIORICET) [phone] MG tablet TAKE 1 TABLET BY MOUTH EVERY 6 HOURS AS NEEDED FOR MIGRAINE HEADACHE 12/04/20   Adline Potter, NP  clonazePAM (KLONOPIN) 1 MG tablet Take 1 tablet (1 mg total) by mouth 4 (four) times daily as needed for anxiety. 08/25/23   Billie Lade, MD  diphenhydrAMINE (BENADRYL) 25 MG tablet Take 25 mg by mouth every 6 (six) hours as needed for allergies.    [provider]  etonogestrel (NEXPLANON) 68 MG IMPL implant 68 mg by Subdermal route once.    [provider]  hyoscyamine (ANASPAZ) 0.125 MG TBDP disintergrating tablet Place 0.125 mg under the tongue every 6 (six) hours as needed for cramping.    [provider]  megestrol (MEGACE) 40 MG tablet Take 3 x 5 days then 2 x 5 days then 1 daily till bleeding stops 08/25/23   Cyril Mourning A, NP  methocarbamol (ROBAXIN) 500 MG tablet Take 2 tablets (1,000 mg total) by mouth every 8 (eight) hours as needed for muscle spasms. 08/11/23   Billie Lade, MD  naloxone [facility]) 2 MG/2ML injection Spray one-half (1 ml) of syringe into each nostril upon signs of opioid overdose. Call 911. May repeat in 3-5 minutes as needed, as directed Patient not taking: Reported on 08/05/2023 01/21/23   [provider]  ondansetron (ZOFRAN) 8 MG tablet SMARTSIG:1 Tablet(s) By Mouth 1-3 Times Daily 04/07/23   [provider]  oxyCODONE-acetaminophen (PERCOCET) 10-325 MG tablet Take 1 tablet by mouth every 6 (six) hours as needed. 08/02/23   Billie Lade, MD  pregabalin (LYRICA) 50 MG capsule Take 50 mg by mouth 3 (three) times daily. 07/12/23   [provider]  promethazine (PHENERGAN) 12.5 MG suppository Place 1 suppository (12.5 mg total) rectally every 8 (eight) hours as needed for nausea or vomiting. 08/11/23   Carlan, Jeral Pinch, NP  Sertraline HCl 200 MG CAPS Take 200  mg by mouth daily after supper.    [provider]  solifenacin (VESICARE) 10 MG tablet Take 1 tablet (10 mg total) by mouth daily. Patient not taking: Reported on 08/05/2023 07/15/23   Adline Potter, NP  SPIRIVA RESPIMAT 2.5 MCG/ACT AERS Inhale 2 each into the lungs daily as needed (respiratory issues.). 09/24/22   [provider]  sucralfate (CARAFATE) 1 g tablet Take 1 tablet (1 g total) by mouth 3 (three) times daily before meals. 11/06/21   Carlan, Chelsea L, NP  traZODone (DESYREL)  50 MG tablet Take 100 mg by mouth at bedtime. 01/21/23   [provider]  valACYclovir (VALTREX) 1000 MG tablet Take 1 tablet (1,000 mg total) by mouth 2 (two) times daily. 08/05/23   Cyril Mourning A, NP  VRAYLAR 1.5 MG capsule Take 1.5 mg by mouth every evening. 09/27/22   [provider]      Allergies    Tape, Buspirone, Fluoxetine, and Nsaids    Review of Systems   Review of Systems  Constitutional:  Negative for fever.  HENT:  Negative for congestion and sore throat.   Eyes: Negative.   Respiratory:  Negative for chest tightness and shortness of breath.   Cardiovascular:  Negative for chest pain.  Gastrointestinal:  Negative for abdominal pain and nausea.  Genitourinary:  Positive for vaginal pain. Negative for vaginal discharge.  Musculoskeletal:  Negative for arthralgias, joint swelling and neck pain.  Skin: Negative.  Negative for rash and wound.  Neurological:  Negative for dizziness, weakness, light-headedness, numbness and headaches.  Psychiatric/Behavioral: Negative.    All other systems reviewed and are negative.   Physical Exam Updated Vital Signs BP 129/81 (BP Location: Right Arm)   Pulse 92   Temp 97.8 F (36.6 C) (Oral)   Resp 16   Ht 5\' 8"  (1.727 m)   Wt 79.8 kg   SpO2 99%   BMI 26.76 kg/m  Physical Exam Vitals and nursing note reviewed. Exam conducted with a chaperone present.  Constitutional:      Appearance: She is well-developed.  HENT:     Head: Normocephalic and atraumatic.  Eyes:     Conjunctiva/sclera: Conjunctivae normal.  Cardiovascular:     Rate and Rhythm: Normal rate.  Pulmonary:     Effort: Pulmonary effort is normal.  Genitourinary:    Labia:        Left: Tenderness present.        Comments: Tenderness and edema at the left inferior labia Musculoskeletal:        General: Normal range of motion.     Cervical back: Normal range of motion.  Skin:    General: Skin is warm and dry.  Neurological:     Mental  Status: She is alert.     ED Results / Procedures / Treatments   Labs (all labs ordered are listed, but only abnormal results are displayed) Labs Reviewed - No data to display  EKG None  Radiology No results found.  Procedures Procedures    INCISION AND DRAINAGE Performed by: Burgess Amor Consent: Verbal consent obtained. Risks and benefits: risks, benefits and alternatives were discussed Type: abscess  Body area: left bartholins gland  Anesthesia: local infiltration  Incision was made with a scalpel.  Local anesthetic: lidocaine 1% without epinephrine  Anesthetic total: 5 ml  Complexity: complex Blunt dissection to break up loculations  Drainage: purulent  Drainage amount: moderate  Packing material: attempted word catheter which caused increased pain. Left open with  instructions for frequent warm compresses.  Patient tolerance: Patient tolerated the procedure well with no immediate complications.   Medications Ordered in ED Medications  lidocaine (PF) (XYLOCAINE) 1 % injection 5 mL (5 mLs Other Given by Other 09/13/23 2111)  HYDROcodone-acetaminophen (NORCO/VICODIN) 5-325 MG per tablet 1 tablet (1 tablet Oral Given 09/13/23 2144)  sulfamethoxazole-trimethoprim (BACTRIM DS) 800-160 MG per tablet 1 tablet (1 tablet Oral Given 09/13/23 2144)    ED Course/ Medical Decision Making/ A&P                                 Medical Decision Making Simple bartholins abscess.    Risk Prescription drug management.           Final Clinical Impression(s) / ED Diagnoses Final diagnoses:  Bartholin's gland abscess    Rx / DC Orders ED Discharge Orders          Ordered    sulfamethoxazole-trimethoprim (BACTRIM DS) 800-160 MG tablet  2 times daily        09/13/23 2136              Victoriano Lain 09/13/23 2253    Benjiman Core, MD 09/13/23 [phone]

## 2023-09-13 NOTE — ED Triage Notes (Signed)
Cyst in vagina LEFT sided Pt requested MD to drain

## 2023-09-15 DIAGNOSIS — Z79899 Other long term (current) drug therapy: Secondary | ICD-10-CM | POA: Diagnosis not present

## 2023-09-22 ENCOUNTER — Other Ambulatory Visit: Payer: Self-pay | Admitting: Internal Medicine

## 2023-09-22 MED ORDER — CLONAZEPAM 1 MG PO TABS: 1.0000 mg | ORAL_TABLET | Freq: Four times a day (QID) | ORAL | 0 refills | Status: DC | PRN

## 2023-09-29 ENCOUNTER — Encounter (HOSPITAL_COMMUNITY): Payer: Self-pay

## 2023-09-29 ENCOUNTER — Emergency Department (HOSPITAL_COMMUNITY)
Admission: EM | Admit: 2023-09-29 | Discharge: 2023-09-29 | Disposition: A | Discharge status: Home / Self Care | Payer: Medicaid Other | Attending: Emergency Medicine | Admitting: Emergency Medicine

## 2023-09-29 ENCOUNTER — Telehealth: Payer: Self-pay | Admitting: *Deleted

## 2023-09-29 ENCOUNTER — Other Ambulatory Visit: Payer: Self-pay

## 2023-09-29 DIAGNOSIS — N751 Abscess of Bartholin's gland: Secondary | ICD-10-CM | POA: Insufficient documentation

## 2023-09-29 LAB — CBC
HCT: 44.3 % (ref 36.0–46.0)
Hemoglobin: 14.4 g/dL (ref 12.0–15.0)
MCH: 31.9 pg (ref 26.0–34.0)
MCHC: 32.5 g/dL (ref 30.0–36.0)
MCV: 98.2 fL (ref 80.0–100.0)
Platelets: 303 10*3/uL (ref 150–400)
RBC: 4.51 MIL/uL (ref 3.87–5.11)
RDW: 12.7 % (ref 11.5–15.5)
WBC: 14.1 10*3/uL — ABNORMAL HIGH (ref 4.0–10.5)
nRBC: 0 % (ref 0.0–0.2)

## 2023-09-29 LAB — BASIC METABOLIC PANEL
Anion gap: 9 (ref 5–15)
BUN: 13 mg/dL (ref 6–20)
CO2: 25 mmol/L (ref 22–32)
Calcium: 10 mg/dL (ref 8.9–10.3)
Chloride: 102 mmol/L (ref 98–111)
Creatinine, Ser: 1.04 mg/dL — ABNORMAL HIGH (ref 0.44–1.00)
GFR, Estimated: >60 mL/min (ref >60)
Glucose, Bld: 94 mg/dL (ref 70–99)
Potassium: 5.2 mmol/L — ABNORMAL HIGH (ref 3.5–5.1)
Sodium: 136 mmol/L (ref 135–145)

## 2023-09-29 MED ORDER — CELECOXIB 200 MG PO CAPS: 200.0000 mg | ORAL_CAPSULE | Freq: Two times a day (BID) | ORAL | 0 refills | Status: DC

## 2023-09-29 MED ORDER — HYDROCODONE-ACETAMINOPHEN 5-325 MG PO TABS
1.0000 | ORAL_TABLET | Freq: Once | ORAL | Status: AC
  Administered 2023-09-29: 1 via ORAL
  Filled 2023-09-29: qty 1

## 2023-09-29 MED ORDER — DOXYCYCLINE HYCLATE 100 MG PO CAPS: 100.0000 mg | ORAL_CAPSULE | Freq: Two times a day (BID) | ORAL | 0 refills | Status: DC

## 2023-09-29 MED ORDER — LIDOCAINE-EPINEPHRINE 2 %-1:100000 IJ SOLN
20.0000 mL | Freq: Once | INTRAMUSCULAR | Status: AC
  Administered 2023-09-29: 20 mL via INTRADERMAL
  Filled 2023-09-29: qty 20

## 2023-09-29 MED ORDER — LIDOCAINE-EPINEPHRINE-TETRACAINE (LET) TOPICAL GEL
3.0000 mL | Freq: Once | TOPICAL | Status: AC
  Administered 2023-09-29: 3 mL via TOPICAL
  Filled 2023-09-29: qty 3

## 2023-09-29 MED ORDER — AMOXICILLIN-POT CLAVULANATE 875-125 MG PO TABS
1.0000 | ORAL_TABLET | Freq: Once | ORAL | Status: AC
  Administered 2023-09-29: 1 via ORAL
  Filled 2023-09-29: qty 1

## 2023-09-29 MED ORDER — OXYCODONE-ACETAMINOPHEN 5-325 MG PO TABS
2.0000 | ORAL_TABLET | Freq: Once | ORAL | Status: AC
Start: 2023-09-29 — End: 2023-09-29
  Administered 2023-09-29: 2 via ORAL
  Filled 2023-09-29: qty 2

## 2023-09-29 MED ORDER — LIDOCAINE-EPINEPHRINE (PF) 2 %-1:200000 IJ SOLN
INTRAMUSCULAR | Status: AC
  Filled 2023-09-29: qty 20

## 2023-09-29 MED ORDER — ONDANSETRON 4 MG PO TBDP
4.0000 mg | ORAL_TABLET | Freq: Once | ORAL | Status: AC
  Administered 2023-09-29: 4 mg via ORAL
  Filled 2023-09-29: qty 1

## 2023-09-29 MED ORDER — DOXYCYCLINE HYCLATE 100 MG PO TABS
100.0000 mg | ORAL_TABLET | Freq: Once | ORAL | Status: AC
  Administered 2023-09-29: 100 mg via ORAL
  Filled 2023-09-29: qty 1

## 2023-09-29 MED ORDER — AMOXICILLIN-POT CLAVULANATE 875-125 MG PO TABS: 1.0000 | ORAL_TABLET | Freq: Two times a day (BID) | ORAL | 0 refills | Status: DC

## 2023-09-29 NOTE — ED Notes (Signed)
 Patient reassessed in triage. Patient is alert and oriented, ambu. Reports the pain medication from earlier did not help her pain; reports 10/10 at this time. Explained delay to patient and reassured her that she will be taken care of appropriately. Vital signs updated

## 2023-09-29 NOTE — ED Provider Notes (Signed)
 [place] EMERGENCY DEPARTMENT AT [facility] Provider Note   CSN: [id number] Arrival date & time: 09/29/23  1339     History  Chief Complaint  Patient presents with   Cyst    Debbie Santana is a 33 y.o. female.  Who presents for evaluation of Bartholin's gland cyst on the left labia.  Patient reports she had the same 1 back on 1221.  She had an incision and drainage.  She reports that has reaccumulated and she is in severe pain.  She denies fevers.  HPI     Home Medications Prior to Admission medications   Medication Sig Start Date End Date Taking? Authorizing Provider  fluconazole  (DIFLUCAN ) 150 MG tablet Take 1 now and repeat 1 in 3 days and again 3 days after that 08/06/23   Signa Delon LABOR, NP  Acetaminophen -Caff-Pyrilamine (MIDOL  COMPLETE) 500-60-15 MG TABS Take 2 tablets by mouth 3 (three) times daily as needed (pain.).    [provider]  albuterol  (VENTOLIN  HFA) 108 (90 Base) MCG/ACT inhaler Inhale 2 puffs into the lungs every 6 (six) hours as needed for wheezing or shortness of breath. 09/11/20   Evonnie Lenis, MD  butalbital -acetaminophen -caffeine  (FIORICET) 50-325-40 MG tablet TAKE 1 TABLET BY MOUTH EVERY 6 HOURS AS NEEDED FOR MIGRAINE HEADACHE 12/04/20   Signa Delon LABOR, NP  clonazePAM  (KLONOPIN ) 1 MG tablet Take 1 tablet (1 mg total) by mouth 4 (four) times daily as needed for anxiety. 09/22/23   Melvenia Manus BRAVO, MD  diphenhydrAMINE  (BENADRYL ) 25 MG tablet Take 25 mg by mouth every 6 (six) hours as needed for allergies.    [provider]  etonogestrel  (NEXPLANON ) 68 MG IMPL implant 68 mg by Subdermal route once.    [provider]  hyoscyamine  (ANASPAZ ) 0.125 MG TBDP disintergrating tablet Place 0.125 mg under the tongue every 6 (six) hours as needed for cramping.    [provider]  megestrol  (MEGACE ) 40 MG tablet Take 3 x 5 days then 2 x 5 days then 1 daily till bleeding stops 08/25/23   Signa Delon A, NP   methocarbamol  (ROBAXIN ) 500 MG tablet Take 2 tablets (1,000 mg total) by mouth every 8 (eight) hours as needed for muscle spasms. 08/11/23   Dixon, Phillip E, MD  naloxone (NARCAN) 2 MG/2ML injection Spray one-half (1 ml) of syringe into each nostril upon signs of opioid overdose. Call 911. May repeat in 3-5 minutes as needed, as directed Patient not taking: Reported on 08/05/2023 01/21/23   [provider]  ondansetron  (ZOFRAN ) 8 MG tablet SMARTSIG:1 Tablet(s) By Mouth 1-3 Times Daily 04/07/23   [provider]  oxyCODONE -acetaminophen  (PERCOCET) 10-325 MG tablet Take 1 tablet by mouth every 6 (six) hours as needed. 08/02/23   Melvenia Manus BRAVO, MD  pregabalin  (LYRICA ) 50 MG capsule Take 50 mg by mouth 3 (three) times daily. 07/12/23   [provider]  promethazine  (PHENERGAN ) 12.5 MG suppository Place 1 suppository (12.5 mg total) rectally every 8 (eight) hours as needed for nausea or vomiting. 08/11/23   Carlan, Chelsea L, NP  Sertraline  HCl 200 MG CAPS Take 200 mg by mouth daily after supper.    [provider]  solifenacin  (VESICARE ) 10 MG tablet Take 1 tablet (10 mg total) by mouth daily. Patient not taking: Reported on 08/05/2023 07/15/23   Signa Delon LABOR, NP  SPIRIVA RESPIMAT 2.5 MCG/ACT AERS Inhale 2 each into the lungs daily as needed (respiratory issues.). 09/24/22   [provider]  sucralfate  (  CARAFATE ) 1 g tablet Take 1 tablet (1 g total) by mouth 3 (three) times daily before meals. 11/06/21   Carlan, Chelsea L, NP  traZODone  (DESYREL ) 50 MG tablet Take 100 mg by mouth at bedtime. 01/21/23   [provider]  valACYclovir  (VALTREX ) 1000 MG tablet Take 1 tablet (1,000 mg total) by mouth 2 (two) times daily. 08/05/23   Signa Delon LABOR, NP  VRAYLAR  1.5 MG capsule Take 1.5 mg by mouth every evening. 09/27/22   [provider]      Allergies    Tape, Buspirone , Fluoxetine, and Nsaids    Review of Systems   Review of  Systems  Physical Exam Updated Vital Signs BP 95/64   Pulse (!) 110   Temp 98 F (36.7 C) (Oral)   Resp 18   Ht 5' 8 (1.727 m)   Wt 79.8 kg   SpO2 95%   BMI 26.76 kg/m  Physical Exam Vitals and nursing note reviewed.  Constitutional:      General: She is not in acute distress.    Appearance: She is well-developed. She is not diaphoretic.  HENT:     Head: Normocephalic and atraumatic.     Right Ear: External ear normal.     Left Ear: External ear normal.     Nose: Nose normal.     Mouth/Throat:     Mouth: Mucous membranes are moist.  Eyes:     General: No scleral icterus.    Conjunctiva/sclera: Conjunctivae normal.  Cardiovascular:     Rate and Rhythm: Normal rate and regular rhythm.     Heart sounds: Normal heart sounds. No murmur heard.    No friction rub. No gallop.  Pulmonary:     Effort: Pulmonary effort is normal. No respiratory distress.     Breath sounds: Normal breath sounds.  Abdominal:     General: Bowel sounds are normal. There is no distension.     Palpations: Abdomen is soft. There is no mass.     Tenderness: There is no abdominal tenderness. There is no guarding.  Genitourinary:    Comments: Large fluctuant left Bartholin's gland abscess.  No surrounding cellulitis Musculoskeletal:     Cervical back: Normal range of motion.  Skin:    General: Skin is warm and dry.  Neurological:     Mental Status: She is alert and oriented to person, place, and time.  Psychiatric:        Behavior: Behavior normal.     ED Results / Procedures / Treatments   Labs (all labs ordered are listed, but only abnormal results are displayed) Labs Reviewed  CBC - Abnormal; Notable for the following components:      Result Value   WBC 14.1 (*)    All other components within normal limits  BASIC METABOLIC PANEL - Abnormal; Notable for the following components:   Potassium 5.2 (*)    Creatinine, Ser 1.04 (*)    All other components within normal limits     EKG None  Radiology No results found.  Procedures INCISION AND DRAINAGE  Date/Time: 09/29/2023 11:51 PM  Performed by: Arloa Chroman, PA-C Authorized by: Arloa Chroman, PA-C   Consent:    Consent obtained:  Verbal   Consent given by:  Patient   Risks discussed:  Bleeding, incomplete drainage, pain and infection   Alternatives discussed:  No treatment Location:    Type:  Bartholin cyst   Size:  4cm   Location:  Anogenital   Anogenital location:  Bartholin's gland Pre-procedure details:    Skin preparation:  Povidone-iodine  Anesthesia:    Anesthesia method:  Topical application and local infiltration   Topical anesthetic:  LET   Local anesthetic:  Lidocaine  2% WITH epi Procedure type:    Complexity:  Complex Procedure details:    Incision types:  Cruciate   Incision depth:  Dermal   Wound management:  Probed and deloculated, irrigated with saline and extensive cleaning   Drainage:  Purulent and bloody   Packing materials:  1/4 in iodoform gauze Post-procedure details:    Procedure completion:  Tolerated with difficulty Comments:     Left wound edge marsupialized with 4-0 Ethilon suture material.  Approximately 5 cm of packing material placed within the wound opening to continue to allow the abscess to drain.     Medications Ordered in ED Medications  HYDROcodone -acetaminophen  (NORCO/VICODIN) 5-325 MG per tablet 1 tablet (1 tablet Oral Given 09/29/23 1628)  ondansetron  (ZOFRAN -ODT) disintegrating tablet 4 mg (4 mg Oral Given 09/29/23 1628)  lidocaine -EPINEPHrine  (XYLOCAINE  W/EPI) 2 %-1:100000 (with pres) injection 20 mL (20 mLs Intradermal Given 09/29/23 2225)  lidocaine -EPINEPHrine -tetracaine  (LET) topical gel (3 mLs Topical Given 09/29/23 2120)  lidocaine -EPINEPHrine  (XYLOCAINE  W/EPI) 2 %-1:200000 (PF) injection (  Given 09/29/23 2225)    ED Course/ Medical Decision Making/ A&P                                 Medical Decision Making Amount and/or Complexity of  Data Reviewed Labs: ordered.  Risk Prescription drug management.   Patient here with large Bartholin's gland abscess.  Wound edge marsupialized.  Small amount of packing placed continue to allow it to open and drain.  Patient given Augmentin  and doxycycline  will be discharged with the same.  She is given pain medication here.  She is under pain contract and given Celebrex  for pain control she is unable to tolerate normal NSAIDs due to stomach upset.  She will follow closely with her GYN and has an appointment on Friday with Dr. Jayne discussed outpatient follow-up and return precautions.  She may have packing removed if it does not fall out sooner and suture material removed on Friday when she sees Dr. Jayne. Appropriate for discharge at this time        Final Clinical Impression(s) / ED Diagnoses Final diagnoses:  None    Rx / DC Orders ED Discharge Orders     None         Arloa Chroman, PA-C 09/29/23 2355    Francesca Elsie CROME, MD 10/04/23 1929

## 2023-09-29 NOTE — Telephone Encounter (Addendum)
 Pt's insurance has denied covering Megestrol 40 mg. Pt needs to try and fail two formulary preferred drugs such as Premphase, Premarin and Mimvey. Pt aware. JSY

## 2023-09-29 NOTE — ED Provider Triage Note (Signed)
 Emergency Medicine Provider Triage Evaluation Note  Debbie Santana , a 33 y.o. female  was evaluated in triage.  Pt complains of Bartholin's gland cyst.  Patient was seen here on 09/13/2023 and had an incision and drainage.  She states that it has reaccumulated and is in severe pain she also noticed blisters on this region as come for reevaluation.  She cannot get into see her OB/GYN until Friday..  Review of Systems  Positive: Labial pain Negative: Fever  Physical Exam  BP 102/67 (BP Location: Right Arm)   Pulse (!) 133   Temp 99.1 F (37.3 C) (Oral)   Resp 17   Ht 5' 8 (1.727 m)   Wt 79.8 kg   SpO2 99%   BMI 26.76 kg/m  Gen:   Awake, no distress   Resp:  Normal effort  MSK:   Moves extremities without difficulty  Other:    Medical Decision Making  Medically screening exam initiated at 3:47 PM.  Appropriate orders placed.  Debbie Santana was informed that the remainder of the evaluation will be completed by another provider, this initial triage assessment does not replace that evaluation, and the importance of remaining in the ED until their evaluation is complete.     Arloa Chroman, PA-C 09/29/23 1549

## 2023-09-29 NOTE — ED Triage Notes (Signed)
 Pt had cyst lanced on 09/13/23. For over a week pt states it is inflamed and hurting worse than it was. Pt denies drainage from site. Pt has an appointment to see her PCP Friday but states she cannot wait d/t the pain. Pt HR in triage elevated at this time. Pt does not appear to be in distress.

## 2023-09-29 NOTE — Discharge Instructions (Addendum)
 Contact a health care provider if: You have a fever. You develop increasing redness, swelling, or pain around your cyst. You have fluid, blood, pus, or a bad smell coming from your cyst. You have a cyst that gets larger or comes back. Summary A Bartholin's cyst is a fluid-filled sac that forms as a result of a blockage along the duct of the Bartholin's gland. If your cyst is small, not infected, and not causing symptoms, you may not need any treatment. If you have a large cyst or an abscess, your health care provider may perform a procedure to drain the fluid. If you have cysts or abscesses that keep returning (recurring) and have required incision and drainage multiple times, your health care provider may talk with you about surgery to remove the Bartholin's gland.

## 2023-09-30 ENCOUNTER — Ambulatory Visit: Payer: Medicaid Other | Admitting: Adult Health

## 2023-09-30 ENCOUNTER — Other Ambulatory Visit (HOSPITAL_COMMUNITY)
Admission: RE | Admit: 2023-09-30 | Discharge: 2023-09-30 | Disposition: A | Discharge status: Home / Self Care | Payer: Medicaid Other | Source: Ambulatory Visit | Attending: Adult Health | Admitting: Adult Health

## 2023-09-30 ENCOUNTER — Encounter: Payer: Self-pay | Admitting: Adult Health

## 2023-09-30 VITALS — BP 113/67 | HR 102 | Ht 68.0 in | Wt 178.0 lb

## 2023-09-30 DIAGNOSIS — N898 Other specified noninflammatory disorders of vagina: Secondary | ICD-10-CM | POA: Diagnosis not present

## 2023-09-30 DIAGNOSIS — Z113 Encounter for screening for infections with a predominantly sexual mode of transmission: Secondary | ICD-10-CM

## 2023-09-30 NOTE — Progress Notes (Signed)
  Subjective:     Patient ID: Debbie Santana, female   DOB: May 21, 1991, 33 y.o.   MRN: [id number]  HPI [name] is a 33 year old white female,single, G2P1011 in complaining of vaginal discharge, she was seen yesterday in ER for left bartholin abscess. She had I&D and packing placed, is on Augmentin  and doxycycline .      Component Value Date/Time   DIAGPAP - Low grade squamous intraepithelial lesion (LSIL) (A) 07/15/2023 1112   DIAGPAP (A) 06/13/2022 1453    - Atypical squamous cells of undetermined significance (ASC-US )   DIAGPAP (A) 09/05/2021 1037    - Atypical squamous cells of undetermined significance (ASC-US )   HPVHIGH Negative 07/15/2023 1112   HPVHIGH Negative 06/13/2022 1453   HPVHIGH Negative 09/05/2021 1037   ADEQPAP  07/15/2023 1112    Satisfactory for evaluation; transformation zone component PRESENT.   ADEQPAP  06/13/2022 1453    Satisfactory for evaluation; transformation zone component PRESENT.   ADEQPAP  09/05/2021 1037    Satisfactory for evaluation; transformation zone component PRESENT.    PCP is Dr Melvenia.  Review of Systems +vaginal discharge Denies any itching or burning Reviewed past medical,surgical, social and family history. Reviewed medications and allergies.     Objective:   Physical Exam BP 113/67 (BP Location: Left Arm, Patient Position: Sitting, Cuff Size: Normal)   Pulse (!) 102   Ht 5' 8 (1.727 m)   Wt 178 lb (80.7 kg)   LMP 09/06/2023 (Approximate)   BMI 27.06 kg/m     Skin warm and dry.Pelvic: external genitalia, has swollen left labia, with packing and suture in place, vagina:obtained vaginal swab,without speculum exam. Examination chaperoned by Clarita Salt LPN  Assessment:     1. Vaginal discharge (Primary) Complains of vaginal discharge Use peri bottle with warm water  when goes to the bathroom  CV swab sent for GC/Chl,trich BV and yeast  - Cervicovaginal ancillary only( [place])  2. Screening examination for STD (sexually  transmitted disease) CV swab sent  - Cervicovaginal ancillary only( [place])     Plan:     Keep appt with Dr Jayne on Friday

## 2023-10-02 ENCOUNTER — Other Ambulatory Visit: Payer: Self-pay | Admitting: Adult Health

## 2023-10-02 ENCOUNTER — Encounter: Payer: Self-pay | Admitting: Internal Medicine

## 2023-10-02 LAB — CERVICOVAGINAL ANCILLARY ONLY
Bacterial Vaginitis (gardnerella): POSITIVE — AB
Candida Glabrata: NEGATIVE
Candida Vaginitis: NEGATIVE
Chlamydia: NEGATIVE
Comment: NEGATIVE
Comment: NEGATIVE
Comment: NEGATIVE
Comment: NEGATIVE
Comment: NEGATIVE
Comment: NORMAL
Neisseria Gonorrhea: NEGATIVE
Trichomonas: NEGATIVE

## 2023-10-02 MED ORDER — METRONIDAZOLE 500 MG PO TABS: 500.0000 mg | ORAL_TABLET | Freq: Two times a day (BID) | ORAL | 0 refills | Status: DC

## 2023-10-02 NOTE — Progress Notes (Signed)
+  BV on vaginal swab will rx flagyl,no sex or alcohol while taking  ?

## 2023-10-02 NOTE — Telephone Encounter (Signed)
 Left message can't refill Fioricet right nor since has percocet rx .

## 2023-10-03 ENCOUNTER — Ambulatory Visit: Payer: Medicaid Other | Admitting: Obstetrics & Gynecology

## 2023-10-03 ENCOUNTER — Encounter: Payer: Self-pay | Admitting: Obstetrics & Gynecology

## 2023-10-03 VITALS — BP 120/72 | HR 90 | Ht 68.0 in

## 2023-10-03 DIAGNOSIS — N751 Abscess of Bartholin's gland: Secondary | ICD-10-CM | POA: Diagnosis not present

## 2023-10-03 NOTE — Progress Notes (Signed)
 Follow up appointment: Left Bartholin gland abscess  Chief Complaint  Patient presents with   Bartholin's Cyst    Blood pressure 120/72, pulse 90, height 5' 8 (1.727 m), last menstrual period 09/06/2023.  1 suture removed, packing is out no infection today, taking her antibiotics as prescribed  MEDS ordered this encounter: No orders of the defined types were placed in this encounter.   Orders for this encounter: No orders of the defined types were placed in this encounter.   Impression + Management Plan   ICD-10-CM   1. Abscess of left Bartholin gland, resolved after I&D + packing  N75.1       Follow Up: No follow-ups on file.     All questions were answered.  Past Medical History:  Diagnosis Date   Anxiety    Bacterial vaginosis    Bradycardia 04/17/2017   Chest pain on breathing 02/22/2020   Chronic back pain    Depression    Erosive gastritis 04/11/2017   Gastric ulcer    Genital warts    GERD (gastroesophageal reflux disease)    High cholesterol    History of gestational hypertension 05/21/2018   Dx intrapartum   History of kidney stones    Intertrigo 11/16/2019   Intractable abdominal pain 04/11/2017   Intractable nausea and vomiting 12/07/2018   Intractable vomiting 05/20/2020   Irregular intermenstrual bleeding 02/19/2019   Mental disorder    Migraine without aura and without status migrainosus, not intractable 02/19/2019   Nausea 05/10/2020   Nausea & vomiting 12/07/2018   Nexplanon  in place 02/19/2019   Obesity 04/17/2017   Panic disorder    Peptic ulcer disease 04/13/2017   Pinched nerve 09/24/2019   Postpartum hypertension 06/23/2018   requiring norvasc , resolved at pp visit   Sciatica    Scoliosis    Tremor of both hands    Trichomoniasis    Vaginal Pap smear, abnormal    Wheezing     Past Surgical History:  Procedure Laterality Date   BACK SURGERY     ruptured disc.   BACK SURGERY  11/2021   BIOPSY  04/14/2017   Procedure:  BIOPSY;  Surgeon: Golda Claudis PENNER, MD;  [facility]: [facility];  Service: Endoscopy;;  gastric   BIOPSY  05/22/2020   Procedure: BIOPSY;  Surgeon: Shaaron Lamar HERO, MD;  [facility];  Service: Endoscopy;;   BIOPSY  08/29/2020   Procedure: BIOPSY;  Surgeon: Eartha Angelia Sieving, MD;  [facility]: [facility];  Service: Gastroenterology;;   BIOPSY  09/09/2020   Procedure: BIOPSY;  Surgeon: Cindie Carlin POUR, DO;  [facility]: [facility];  Service: Endoscopy;;  duodenum;antral   BIOPSY  11/21/2021   Procedure: BIOPSY;  Surgeon: Golda Claudis PENNER, MD;  [facility];  Service: Endoscopy;;   BIOPSY  03/06/2023   Procedure: BIOPSY;  Surgeon: Eartha Angelia Sieving, MD;  [facility];  Service: Gastroenterology;;   CERVICAL ABLATION N/A 12/02/2018   Procedure: LASER ABLATION OF CERVIX;  Surgeon: Jayne Vonn DEL, MD;  Location: [facility];  Service: Gynecology;  Laterality: N/A;   CHOLECYSTECTOMY N/A 04/17/2017   Procedure: LAPAROSCOPIC CHOLECYSTECTOMY;  Surgeon: Mavis Anes, MD;  Location: [facility];  Service: General;  Laterality: N/A;   COLONOSCOPY WITH PROPOFOL  N/A 08/29/2020   Procedure: COLONOSCOPY WITH PROPOFOL ;  Surgeon: Eartha Angelia Sieving, MD;  [facility];  Service: Gastroenterology;  Laterality: N/A;  11:15   COLPOSCOPY     ESOPHAGOGASTRODUODENOSCOPY (EGD) WITH PROPOFOL  N/A  04/14/2017   Procedure: ESOPHAGOGASTRODUODENOSCOPY (EGD) WITH PROPOFOL ;  Surgeon: Golda Claudis PENNER, MD;  [facility];  Service: Endoscopy;  Laterality: N/A;   ESOPHAGOGASTRODUODENOSCOPY (EGD) WITH PROPOFOL  N/A 05/22/2020   Procedure: ESOPHAGOGASTRODUODENOSCOPY (EGD) WITH PROPOFOL ;  Surgeon: Shaaron Lamar HERO, MD;  [facility];  Service: Endoscopy;  Laterality: N/A;   ESOPHAGOGASTRODUODENOSCOPY (EGD) WITH PROPOFOL  N/A 09/09/2020   Procedure: ESOPHAGOGASTRODUODENOSCOPY (EGD) WITH PROPOFOL ;  Surgeon: Cindie Carlin POUR, DO;  [facility];   Service: Endoscopy;  Laterality: N/A;   ESOPHAGOGASTRODUODENOSCOPY (EGD) WITH PROPOFOL  N/A 11/21/2020   Procedure: ESOPHAGOGASTRODUODENOSCOPY (EGD) WITH PROPOFOL ;  Surgeon: Eartha Angelia Sieving, MD;  [facility];  Service: Gastroenterology;  Laterality: N/A;  Am   ESOPHAGOGASTRODUODENOSCOPY (EGD) WITH PROPOFOL  N/A 11/21/2021   Procedure: ESOPHAGOGASTRODUODENOSCOPY (EGD) WITH PROPOFOL ;  Surgeon: Golda Claudis PENNER, MD;  [facility];  Service: Endoscopy;  Laterality: N/A;  820   ESOPHAGOGASTRODUODENOSCOPY (EGD) WITH PROPOFOL  N/A 03/06/2023   Procedure: ESOPHAGOGASTRODUODENOSCOPY (EGD) WITH PROPOFOL ;  Surgeon: Eartha Angelia Sieving, MD;  [facility];  Service: Gastroenterology;  Laterality: N/A;  11:45AM;ASA 2   HERNIA REPAIR Bilateral    inguinal   LASER ABLATION CONDOLAMATA N/A 12/02/2018   Procedure: LASER ABLATION CONDYLOMA ACCUMINATA LEFT AND RIGHT VULVA, PERINEUM AND PERIANAL (15 TOTAL);  Surgeon: Jayne Vonn DEL, MD;  Location: [facility];  Service: Gynecology;  Laterality: N/A;   LUMBAR LAMINECTOMY/DECOMPRESSION MICRODISCECTOMY Right 12/03/2021   Procedure: MICRODISCECTOMY Lumbar four- five;  Surgeon: Mavis Purchase, MD;  [facility];  Service: Neurosurgery;  Laterality: Right;   MOUTH SURGERY  09/2021   All top teeth and all wisdom teeth removed   MOUTH SURGERY  12/2021   POLYPECTOMY  08/29/2020   Procedure: POLYPECTOMY;  Surgeon: Eartha Angelia, Sieving, MD;  [facility];  Service: Gastroenterology;;    OB History     Gravida  2   Para  1   Term  1   Preterm      AB  1   Living  1      SAB  1   IAB      Ectopic      Multiple  0   Live Births  1           Allergies  Allergen Reactions   Tape Other (See Comments)    Blisters   Buspirone  Other (See Comments)    Shaky/jittery   Fluoxetine Other (See Comments)    Ineffective; did not resolve depression/anxiety   Nsaids Other (See Comments)     History of ulcers    Social History   Socioeconomic History   Marital status: Single    Spouse name: Not on file   Number of children: 1   Years of education: Not on file   Highest education level: Not on file  Occupational History   Not on file  Tobacco Use   Smoking status: Every Day    Current packs/day: 1.00    Average packs/day: 1 pack/day for 10.0 years (10.0 ttl pk-yrs)    Types: Cigarettes    Passive exposure: Current   Smokeless tobacco: Never  Vaping Use   Vaping status: Former  Substance and Sexual Activity   Alcohol  use: Not Currently   Drug use: Not Currently    Comment: delta 9- quit   Sexual activity: Not Currently    Birth control/protection: Implant  Other Topics Concern   Not on file  Social History Narrative  Lives with son-85 years old- [employer]/support/administrative Officer (lives with mom and dad)   Father around some.      3 dogs and 3 cats      Enjoy: crafts      Diet: eats all food groups -acidic foods   Caffeine : stopped in last week or so   Water : 6-8 cups daily       Wears seat belt   Does not drive   Smoke detectors at home   Fire extinguisher    No Weapons    Social Drivers of Health   Financial Resource Strain: Low Risk  (06/13/2022)   Overall Financial Resource Strain (CARDIA)    Difficulty of Paying Living Expenses: Not very hard  Food Insecurity: Food Insecurity Present (06/13/2022)   Hunger Vital Sign    Worried About Running Out of Food in the Last Year: Sometimes true    Ran Out of Food in the Last Year: Sometimes true  Transportation Needs: No Transportation Needs (06/13/2022)   PRAPARE - Administrator, Civil Service (Medical): No    Lack of Transportation (Non-Medical): No  Physical Activity: Insufficiently Active (06/13/2022)   Exercise Vital Sign    Days of Exercise per Week: 2 days    Minutes of Exercise per Session: 50 min  Stress: Stress Concern Present (06/13/2022)   [employer] of Occupational Health - Occupational Stress  Questionnaire    Feeling of Stress : Rather much  Social Connections: Socially Isolated (06/13/2022)   Social Connection and Isolation Panel [NHANES]    Frequency of Communication with Friends and Family: Once a week    Frequency of Social Gatherings with Friends and Family: Never    Attends Religious Services: Never    Database Administrator or Organizations: No    Attends Engineer, Structural: Never    Marital Status: Never married    Family History  Problem Relation Age of Onset   Anxiety disorder Mother    Hyperlipidemia Mother    Crohn's disease Sister    Diabetes Maternal Grandfather    Diabetes Cousin    Learning disabilities Cousin

## 2023-10-06 MED ORDER — BUTALBITAL-APAP-CAFFEINE 50-325-40 MG PO TABS: 1.0000 | ORAL_TABLET | Freq: Two times a day (BID) | ORAL | 0 refills | Status: DC | PRN

## 2023-10-09 DIAGNOSIS — M47819 Spondylosis without myelopathy or radiculopathy, site unspecified: Secondary | ICD-10-CM | POA: Diagnosis not present

## 2023-10-09 DIAGNOSIS — Z6827 Body mass index (BMI) 27.0-27.9, adult: Secondary | ICD-10-CM | POA: Diagnosis not present

## 2023-10-09 DIAGNOSIS — M4302 Spondylolysis, cervical region: Secondary | ICD-10-CM | POA: Diagnosis not present

## 2023-10-09 DIAGNOSIS — Z3200 Encounter for pregnancy test, result unknown: Secondary | ICD-10-CM | POA: Diagnosis not present

## 2023-10-09 DIAGNOSIS — Z6826 Body mass index (BMI) 26.0-26.9, adult: Secondary | ICD-10-CM | POA: Diagnosis not present

## 2023-10-09 DIAGNOSIS — M4184 Other forms of scoliosis, thoracic region: Secondary | ICD-10-CM | POA: Diagnosis not present

## 2023-10-09 DIAGNOSIS — Z79891 Long term (current) use of opiate analgesic: Secondary | ICD-10-CM | POA: Diagnosis not present

## 2023-10-13 DIAGNOSIS — Z79899 Other long term (current) drug therapy: Secondary | ICD-10-CM | POA: Diagnosis not present

## 2023-10-22 ENCOUNTER — Other Ambulatory Visit: Payer: Self-pay | Admitting: Internal Medicine

## 2023-10-22 MED ORDER — CLONAZEPAM 1 MG PO TABS: 1.0000 mg | ORAL_TABLET | Freq: Four times a day (QID) | ORAL | 0 refills | Status: DC | PRN

## 2023-10-31 NOTE — Therapy (Incomplete)
OUTPATIENT PHYSICAL THERAPY THORACOLUMBAR AND CERVICAL EVALUATION   Patient Name: Debbie Santana MRN: [id number] DOB:Dec 17, 1990, 33 y.o., female Today's Date: 10/31/2023  END OF SESSION:   Past Medical History:  Diagnosis Date   Anxiety    Bacterial vaginosis    Bradycardia 04/17/2017   Chest pain on breathing 02/22/2020   Chronic back pain    Depression    Erosive gastritis 04/11/2017   Gastric ulcer    Genital warts    GERD (gastroesophageal reflux disease)    High cholesterol    History of gestational hypertension 05/21/2018   Dx intrapartum   History of kidney stones    Intertrigo 11/16/2019   Intractable abdominal pain 04/11/2017   Intractable nausea and vomiting 12/07/2018   Intractable vomiting 05/20/2020   Irregular intermenstrual bleeding 02/19/2019   Mental disorder    Migraine without aura and without status migrainosus, not intractable 02/19/2019   Nausea 05/10/2020   Nausea & vomiting 12/07/2018   Nexplanon in place 02/19/2019   Obesity 04/17/2017   Panic disorder    Peptic ulcer disease 04/13/2017   Pinched nerve 09/24/2019   Postpartum hypertension 06/23/2018   requiring norvasc, resolved at pp visit   Sciatica    Scoliosis    Tremor of both hands    Trichomoniasis    Vaginal Pap smear, abnormal    Wheezing    Past Surgical History:  Procedure Laterality Date   BACK SURGERY     ruptured disc.   BACK SURGERY  11/2021   BIOPSY  04/14/2017   Procedure: BIOPSY;  Surgeon: Malissa Hippo, MD;  Location: [facility];  Service: Endoscopy;;  gastric   BIOPSY  05/22/2020   Procedure: BIOPSY;  Surgeon: Corbin Ade, MD;  Location: [facility];  Service: Endoscopy;;   BIOPSY  08/29/2020   Procedure: BIOPSY;  Surgeon: Dolores Frame, MD;  Location: [facility];  Service: Gastroenterology;;   BIOPSY  09/09/2020   Procedure: BIOPSY;  Surgeon: Lanelle Bal, DO;  Location: [facility];  Service: Endoscopy;;  duodenum;antral    BIOPSY  11/21/2021   Procedure: BIOPSY;  Surgeon: Malissa Hippo, MD;  Location: [facility];  Service: Endoscopy;;   BIOPSY  03/06/2023   Procedure: BIOPSY;  Surgeon: Dolores Frame, MD;  Location: [facility];  Service: Gastroenterology;;   CERVICAL ABLATION N/A 12/02/2018   Procedure: LASER ABLATION OF CERVIX;  Surgeon: Lazaro Arms, MD;  Location: [facility];  Service: Gynecology;  Laterality: N/A;   CHOLECYSTECTOMY N/A 04/17/2017   Procedure: LAPAROSCOPIC CHOLECYSTECTOMY;  Surgeon: Franky Macho, MD;  Location: [facility];  Service: General;  Laterality: N/A;   COLONOSCOPY WITH PROPOFOL N/A 08/29/2020   Procedure: COLONOSCOPY WITH PROPOFOL;  Surgeon: Dolores Frame, MD;  Location: [facility];  Service: Gastroenterology;  Laterality: N/A;  11:15   COLPOSCOPY     ESOPHAGOGASTRODUODENOSCOPY (EGD) WITH PROPOFOL N/A 04/14/2017   Procedure: ESOPHAGOGASTRODUODENOSCOPY (EGD) WITH PROPOFOL;  Surgeon: Malissa Hippo, MD;  [facility]: [facility];  Service: Endoscopy;  Laterality: N/A;   ESOPHAGOGASTRODUODENOSCOPY (EGD) WITH PROPOFOL N/A 05/22/2020   Procedure: ESOPHAGOGASTRODUODENOSCOPY (EGD) WITH PROPOFOL;  Surgeon: Corbin Ade, MD;  [facility]: [facility];  Service: Endoscopy;  Laterality: N/A;   ESOPHAGOGASTRODUODENOSCOPY (EGD) WITH PROPOFOL N/A 09/09/2020   Procedure: ESOPHAGOGASTRODUODENOSCOPY (EGD) WITH PROPOFOL;  Surgeon: Lanelle Bal, DO;  [facility]: [facility];  Service: Endoscopy;  Laterality: N/A;   ESOPHAGOGASTRODUODENOSCOPY (EGD) WITH PROPOFOL N/A 11/21/2020   Procedure:  ESOPHAGOGASTRODUODENOSCOPY (EGD) WITH PROPOFOL;  Surgeon: Marguerita Merles, Reuel Boom, MD;  Location: [facility];  Service: Gastroenterology;  Laterality: N/A;  Am   ESOPHAGOGASTRODUODENOSCOPY (EGD) WITH PROPOFOL N/A 11/21/2021   Procedure: ESOPHAGOGASTRODUODENOSCOPY (EGD) WITH PROPOFOL;  Surgeon: Malissa Hippo, MD;  [facility]: [facility];  Service: Endoscopy;  Laterality: N/A;   820   ESOPHAGOGASTRODUODENOSCOPY (EGD) WITH PROPOFOL N/A 03/06/2023   Procedure: ESOPHAGOGASTRODUODENOSCOPY (EGD) WITH PROPOFOL;  Surgeon: Dolores Frame, MD;  [facility]: [facility];  Service: Gastroenterology;  Laterality: N/A;  11:45AM;ASA 2   HERNIA REPAIR Bilateral    inguinal   LASER ABLATION CONDOLAMATA N/A 12/02/2018   Procedure: LASER ABLATION CONDYLOMA ACCUMINATA LEFT AND RIGHT VULVA, PERINEUM AND PERIANAL (15 TOTAL);  Surgeon: Lazaro Arms, MD;  Location: [facility];  Service: Gynecology;  Laterality: N/A;   LUMBAR LAMINECTOMY/DECOMPRESSION MICRODISCECTOMY Right 12/03/2021   Procedure: MICRODISCECTOMY Lumbar four- five;  Surgeon: Tressie Stalker, MD;  Location: [facility];  Service: Neurosurgery;  Laterality: Right;   MOUTH SURGERY  09/2021   All top teeth and all wisdom teeth removed   MOUTH SURGERY  12/2021   POLYPECTOMY  08/29/2020   Procedure: POLYPECTOMY;  Surgeon: Dolores Frame, MD;  Location: [facility];  Service: Gastroenterology;;   Patient Active Problem List   Diagnosis Date Noted   Screening examination for STD (sexually transmitted disease) 08/05/2023   Herpes 08/05/2023   Vaginal irritation 08/05/2023   Vaginal discharge 08/05/2023   Urinary incontinence 08/05/2023   Continuous urine leakage 07/15/2023   Essential tremor 05/01/2023   History of migraine headaches 05/01/2023   History of UTI 02/18/2023   Hematuria 02/18/2023   Trichomoniasis 02/04/2023   Substance abuse (HCC) 12/17/2022   Bartholin's gland cyst 12/17/2022   Dysmenorrhea 10/24/2022   Dyspareunia, female 10/24/2022   Pelvic pain 10/24/2022   Helicobacter positive gastritis 05/07/2022   Erosive gastropathy 05/07/2022   Boil 04/12/2022   Positive H. 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) 08/23/2020   Post laminectomy syndrome 08/23/2020   Frequent headaches 08/10/2020   Nexplanon in place 08/10/2020   Irregular intermenstrual bleeding 08/10/2020   Obesity (BMI 30-39.9) 05/20/2020   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   Panic attack 05/18/2020   Epigastric pain 05/18/2020   Agitated 05/18/2020   Gastroesophageal reflux disease 05/10/2020   Alternating constipation and diarrhea 05/10/2020   Bilateral wheezing 02/22/2020   Sleep disturbance 02/22/2020   Anxiety and depression 02/19/2019   Intractable nausea and vomiting 12/07/2018   Low back pain 12/07/2018   Chronic diarrhea 12/07/2018   Nausea & vomiting 12/07/2018   Condyloma 04/15/2018   Abnormal Pap smear of cervix 11/04/2017   Reflux esophagitis 04/17/2017   Tobacco abuse 04/17/2017   Gastro-esophageal reflux disease with esophagitis 04/17/2017   Peptic ulcer disease 04/13/2017   Erosive gastritis 04/11/2017   Leukocytosis 04/11/2017   Anxiety 04/11/2017    PCP: Delmar Landau, MD  REFERRING PROVIDER: Tressie Stalker, MD  REFERRING DIAG:  Diagnosis  M54.2 (ICD-10-CM) - Cervicalgia  M51.16 (ICD-10-CM) - Herniation of lumbar intervertebral disc with radiculopathy    Rationale for Evaluation and Treatment:  Rehabilitation  THERAPY DIAG:  No diagnosis found.  ONSET DATE: ***  SUBJECTIVE:                                                                                                                                                                                           SUBJECTIVE STATEMENT: ***  PERTINENT HISTORY:  ***  PAIN:  Are you having pain? {OPRCPAIN:27236}  PRECAUTIONS: {Therapy precautions:24002}  RED FLAGS: {PT Red  Flags:29287}   WEIGHT BEARING RESTRICTIONS: {Yes ***/No:24003}  FALLS:  Has patient fallen in last 6 months? {fallsyesno:27318}  LIVING ENVIRONMENT: Lives with: {OPRC lives with:25569::"lives with their family"} Lives in: {Lives in:25570} Stairs: {opstairs:27293} Has following equipment at home: {Assistive devices:23999}  OCCUPATION: ***  PLOF: {PLOF:24004}  PATIENT GOALS: ***  NEXT MD VISIT: ***  OBJECTIVE:  Note: Objective measures were completed at Evaluation unless otherwise noted.  DIAGNOSTIC FINDINGS:  ***  PATIENT SURVEYS:  {rehab surveys:24030}  COGNITION: Overall cognitive status: {cognition:24006}     SENSATION: {sensation:27233}  MUSCLE LENGTH: Hamstrings: Right *** deg; Left *** deg Maisie Fus test: Right *** deg; Left *** deg  POSTURE: {posture:25561}  PALPATION: ***  LUMBAR ROM:   AROM eval  Flexion   Extension   Right lateral flexion   Left lateral flexion   Right rotation   Left rotation    (Blank rows = not tested)  LOWER EXTREMITY ROM:     {AROM/PROM:27142}  Right eval Left eval  Hip flexion    Hip extension    Hip abduction    Hip adduction    Hip internal rotation    Hip external rotation    Knee flexion    Knee extension    Ankle dorsiflexion    Ankle plantarflexion    Ankle inversion    Ankle eversion     (Blank rows = not tested)  LOWER EXTREMITY MMT:    MMT Right eval Left eval  Hip flexion    Hip extension    Hip abduction    Hip adduction    Hip internal rotation    Hip external rotation    Knee flexion    Knee extension    Ankle dorsiflexion    Ankle plantarflexion    Ankle inversion    Ankle eversion     (Blank rows = not tested)  LUMBAR SPECIAL TESTS:  {lumbar special test:25242}  CERVICAL ROM:   {AROM/PROM:27142} ROM A/PROM (deg) eval  Flexion   Extension   Right lateral flexion   Left lateral flexion   Right rotation   Left rotation    (Blank rows = not tested)  UPPER EXTREMITY  ROM:  {  AROM/PROM:27142} ROM Right eval Left eval  Shoulder flexion    Shoulder extension    Shoulder abduction    Shoulder adduction    Shoulder extension    Shoulder internal rotation    Shoulder external rotation    Elbow flexion    Elbow extension    Wrist flexion    Wrist extension    Wrist ulnar deviation    Wrist radial deviation    Wrist pronation    Wrist supination     (Blank rows = not tested)  UPPER EXTREMITY MMT:  MMT Right eval Left eval  Shoulder flexion    Shoulder extension    Shoulder abduction    Shoulder adduction    Shoulder extension    Shoulder internal rotation    Shoulder external rotation    Middle trapezius    Lower trapezius    Elbow flexion    Elbow extension    Wrist flexion    Wrist extension    Wrist ulnar deviation    Wrist radial deviation    Wrist pronation    Wrist supination    Grip strength     (Blank rows = not tested)  CERVICAL SPECIAL TESTS:  {Cervical special tests:25246}   FUNCTIONAL TESTS:  {Functional tests:24029}  GAIT: Distance walked: *** Assistive device utilized: {Assistive devices:23999} Level of assistance: {Levels of assistance:24026} Comments: ***  TREATMENT DATE: 11/05/2023 physical therapy evaluation and HEP instruction                                                                                                                                 PATIENT EDUCATION:  Education details: Patient educated on exam findings, POC, scope of PT, HEP, and ***. Person educated: Patient Education method: Explanation, Demonstration, and Handouts Education comprehension: verbalized understanding, returned demonstration, verbal cues required, and tactile cues required  HOME EXERCISE PROGRAM: ***  ASSESSMENT:  CLINICAL IMPRESSION: Patient is a 33 y.o. female who was seen today for physical therapy evaluation and treatment for  Diagnosis  M54.2 (ICD-10-CM) - Cervicalgia  M51.16 (ICD-10-CM) - Herniation  of lumbar intervertebral disc with radiculopathy  .   OBJECTIVE IMPAIRMENTS: {opptimpairments:25111}.   ACTIVITY LIMITATIONS: {activitylimitations:27494}  PARTICIPATION LIMITATIONS: {participationrestrictions:25113}  PERSONAL FACTORS: {Personal factors:25162} are also affecting patient's functional outcome.   REHAB POTENTIAL: Good  CLINICAL DECISION MAKING: {clinical decision making:25114}  EVALUATION COMPLEXITY: {Evaluation complexity:25115}   GOALS: Goals reviewed with patient? No  SHORT TERM GOALS: Target date: ***  *** Baseline: Goal status: INITIAL  2.  *** Baseline:  Goal status: INITIAL  3.  *** Baseline:  Goal status: INITIAL  4.  *** Baseline:  Goal status: INITIAL  5.  *** Baseline:  Goal status: INITIAL  6.  *** Baseline:  Goal status: INITIAL  LONG TERM GOALS: Target date: ***  *** Baseline:  Goal status: INITIAL  2.  *** Baseline:  Goal status: INITIAL  3.  *** Baseline:  Goal status:  INITIAL  4.  *** Baseline:  Goal status: INITIAL  5.  *** Baseline:  Goal status: INITIAL  6.  *** Baseline:  Goal status: INITIAL  PLAN:  PT FREQUENCY: {rehab frequency:25116}  PT DURATION: {rehab duration:25117}  PLANNED INTERVENTIONS: 97164- PT Re-evaluation, 97110-Therapeutic exercises, 97530- Therapeutic activity, 97112- Neuromuscular re-education, 97535- Self Care, 09811- Manual therapy, [id number]- Gait training, [phone]- Orthotic Fit/training, [phone]- Canalith repositioning, [id number]- Aquatic Therapy, [phone]- Splinting, Patient/Family education, Balance training, Stair training, Taping, Dry Needling, Joint mobilization, Joint manipulation, Spinal manipulation, Spinal mobilization, Scar mobilization, and DME instructions. Marland Kitchen  PLAN FOR NEXT SESSION: Review HEP and goals   12:11 PM, 11/04/23 Karene Bracken Small Demetrion Wesby MPT  physical therapy [place] [phone]

## 2023-11-03 ENCOUNTER — Ambulatory Visit: Payer: Medicaid Other | Admitting: Internal Medicine

## 2023-11-03 ENCOUNTER — Encounter: Payer: Self-pay | Admitting: Internal Medicine

## 2023-11-03 VITALS — BP 132/74 | HR 105 | Ht 68.0 in | Wt 185.0 lb

## 2023-11-03 DIAGNOSIS — G894 Chronic pain syndrome: Secondary | ICD-10-CM | POA: Diagnosis not present

## 2023-11-03 DIAGNOSIS — F419 Anxiety disorder, unspecified: Secondary | ICD-10-CM

## 2023-11-03 DIAGNOSIS — M5416 Radiculopathy, lumbar region: Secondary | ICD-10-CM

## 2023-11-03 DIAGNOSIS — M961 Postlaminectomy syndrome, not elsewhere classified: Secondary | ICD-10-CM | POA: Diagnosis not present

## 2023-11-03 DIAGNOSIS — F32A Depression, unspecified: Secondary | ICD-10-CM | POA: Diagnosis not present

## 2023-11-03 DIAGNOSIS — G8929 Other chronic pain: Secondary | ICD-10-CM

## 2023-11-03 DIAGNOSIS — R109 Unspecified abdominal pain: Secondary | ICD-10-CM | POA: Diagnosis not present

## 2023-11-03 DIAGNOSIS — R519 Headache, unspecified: Secondary | ICD-10-CM

## 2023-11-03 MED ORDER — BUTALBITAL-APAP-CAFFEINE 50-325-40 MG PO TABS: 1.0000 | ORAL_TABLET | Freq: Two times a day (BID) | ORAL | 0 refills | Status: DC | PRN

## 2023-11-03 NOTE — Assessment & Plan Note (Signed)
 Her acute concern today is chronic lumbar back pain.  Previously referred to pain management and established care.  Unfortunately, she reports that she was recently dismissed from pain management due to an inappropriate UDS.  PDMP reviewed. She has most recently been prescribed oxycodone -acetaminophen  10-325 mg 4 times daily and Lyrica  75 mg twice daily.  -New pain management referral placed today.  No medication refills were provided.

## 2023-11-03 NOTE — Assessment & Plan Note (Addendum)
 Mood remains stable and anxiety adequately controlled with sertraline , Vraylar , and clonazepam .  No medication changes are indicated today.

## 2023-11-03 NOTE — Progress Notes (Signed)
 Established Patient Office Visit  Subjective   Patient ID: Debbie Santana, female    DOB: 1991-06-17  Age: 33 y.o. MRN: [id number]  Chief Complaint  Patient presents with   Care Management    Six month follow up    Debbie Santana returns to care today for routine follow-up.  She was last evaluated by me in August 2024 as a new patient presenting to establish care.  No medication changes were made at that time, a referral was placed to psychiatry, and 52-month follow-up was arranged.  In the interim, she has been seen on multiple occasions gynecology, 1/10 most recently, in the setting of a Bartholin gland abscess.  She has also established care with pain management.  There have otherwise been no acute interval events. Debbie Santana reports feeling fairly well today.  She endorses chronic lumbar back pain.  Her acute concern is requesting refills of her pain medication because she was recently dismissed from management for what she describes as an unexpected UDS result.  Past Medical History:  Diagnosis Date   Anxiety    Bacterial vaginosis    Bradycardia 04/17/2017   Chest pain on breathing 02/22/2020   Chronic back pain    Depression    Erosive gastritis 04/11/2017   Gastric ulcer    Genital warts    GERD (gastroesophageal reflux disease)    High cholesterol    History of gestational hypertension 05/21/2018   Dx intrapartum   History of kidney stones    Intertrigo 11/16/2019   Intractable abdominal pain 04/11/2017   Intractable nausea and vomiting 12/07/2018   Intractable vomiting 05/20/2020   Irregular intermenstrual bleeding 02/19/2019   Mental disorder    Migraine without aura and without status migrainosus, not intractable 02/19/2019   Nausea 05/10/2020   Nausea & vomiting 12/07/2018   Nexplanon  in place 02/19/2019   Obesity 04/17/2017   Panic disorder    Peptic ulcer disease 04/13/2017   Pinched nerve 09/24/2019   Postpartum hypertension 06/23/2018   requiring norvasc ,  resolved at pp visit   Sciatica    Scoliosis    Tremor of both hands    Trichomoniasis    Vaginal Pap smear, abnormal    Wheezing    Past Surgical History:  Procedure Laterality Date   BACK SURGERY     ruptured disc.   BACK SURGERY  11/2021   BIOPSY  04/14/2017   Procedure: BIOPSY;  Surgeon: Ruby Corporal, MD;  Location: [facility];  Service: Endoscopy;;  gastric   BIOPSY  05/22/2020   Procedure: BIOPSY;  Surgeon: Suzette Espy, MD;  Location: [facility];  Service: Endoscopy;;   BIOPSY  08/29/2020   Procedure: BIOPSY;  Surgeon: Urban Garden, MD;  Location: [facility];  Service: Gastroenterology;;   BIOPSY  09/09/2020   Procedure: BIOPSY;  Surgeon: Vinetta Greening, DO;  Location: [facility];  Service: Endoscopy;;  duodenum;antral   BIOPSY  11/21/2021   Procedure: BIOPSY;  Surgeon: Ruby Corporal, MD;  Location: [facility];  Service: Endoscopy;;   BIOPSY  03/06/2023   Procedure: BIOPSY;  Surgeon: Urban Garden, MD;  Location: [facility];  Service: Gastroenterology;;   CERVICAL ABLATION N/A 12/02/2018   Procedure: LASER ABLATION OF CERVIX;  Surgeon: Wendelyn Halter, MD;  Location: [facility];  Service: Gynecology;  Laterality: N/A;   CHOLECYSTECTOMY N/A 04/17/2017   Procedure: LAPAROSCOPIC CHOLECYSTECTOMY;  Surgeon: Alanda Allegra, MD;  Location: [facility];  Service:  General;  Laterality: N/A;   COLONOSCOPY WITH PROPOFOL  N/A 08/29/2020   Procedure: COLONOSCOPY WITH PROPOFOL ;  Surgeon: Urban Garden, MD;  Location: [facility];  Service: Gastroenterology;  Laterality: N/A;  11:15   COLPOSCOPY     ESOPHAGOGASTRODUODENOSCOPY (EGD) WITH PROPOFOL  N/A 04/14/2017   Procedure: ESOPHAGOGASTRODUODENOSCOPY (EGD) WITH PROPOFOL ;  Surgeon: Ruby Corporal, MD;  Location: [facility];  Service: Endoscopy;  Laterality: N/A;   ESOPHAGOGASTRODUODENOSCOPY (EGD) WITH PROPOFOL  N/A 05/22/2020   Procedure: ESOPHAGOGASTRODUODENOSCOPY (EGD) WITH  PROPOFOL ;  Surgeon: Suzette Espy, MD;  [facility]: [facility];  Service: Endoscopy;  Laterality: N/A;   ESOPHAGOGASTRODUODENOSCOPY (EGD) WITH PROPOFOL  N/A 09/09/2020   Procedure: ESOPHAGOGASTRODUODENOSCOPY (EGD) WITH PROPOFOL ;  Surgeon: Vinetta Greening, DO;  [facility]: [facility];  Service: Endoscopy;  Laterality: N/A;   ESOPHAGOGASTRODUODENOSCOPY (EGD) WITH PROPOFOL  N/A 11/21/2020   Procedure: ESOPHAGOGASTRODUODENOSCOPY (EGD) WITH PROPOFOL ;  Surgeon: Urban Garden, MD;  [facility]: [facility];  Service: Gastroenterology;  Laterality: N/A;  Am   ESOPHAGOGASTRODUODENOSCOPY (EGD) WITH PROPOFOL  N/A 11/21/2021   Procedure: ESOPHAGOGASTRODUODENOSCOPY (EGD) WITH PROPOFOL ;  Surgeon: Ruby Corporal, MD;  [facility]: [facility];  Service: Endoscopy;  Laterality: N/A;  820   ESOPHAGOGASTRODUODENOSCOPY (EGD) WITH PROPOFOL  N/A 03/06/2023   Procedure: ESOPHAGOGASTRODUODENOSCOPY (EGD) WITH PROPOFOL ;  Surgeon: Urban Garden, MD;  Location: [facility];  Service: Gastroenterology;  Laterality: N/A;  11:45AM;ASA 2   HERNIA REPAIR Bilateral    inguinal   LASER ABLATION CONDOLAMATA N/A 12/02/2018   Procedure: LASER ABLATION CONDYLOMA ACCUMINATA LEFT AND RIGHT VULVA, PERINEUM AND PERIANAL (15 TOTAL);  Surgeon: Wendelyn Halter, MD;  Location: [facility];  Service: Gynecology;  Laterality: N/A;   LUMBAR LAMINECTOMY/DECOMPRESSION MICRODISCECTOMY Right 12/03/2021   Procedure: MICRODISCECTOMY Lumbar four- five;  Surgeon: Garry Kansas, MD;  Location: [facility];  Service: Neurosurgery;  Laterality: Right;   MOUTH SURGERY  09/2021   All top teeth and all wisdom teeth removed   MOUTH SURGERY  12/2021   POLYPECTOMY  08/29/2020   Procedure: POLYPECTOMY;  Surgeon: Umberto Ganong, Bearl Limes, MD;  Location: [facility];  Service: Gastroenterology;;   Social History   Tobacco Use   Smoking status: Every Day    Current packs/day: 1.00    Average packs/day: 1 pack/day for 10.0 years (10.0 ttl  pk-yrs)    Types: Cigarettes    Passive exposure: Current   Smokeless tobacco: Never  Vaping Use   Vaping status: Former  Substance Use Topics   Alcohol  use: Not Currently   Drug use: Not Currently    Comment: delta 9- quit   Family History  Problem Relation Age of Onset   Anxiety disorder Mother    Hyperlipidemia Mother    Crohn's disease Sister    Diabetes Maternal Grandfather    Diabetes Cousin    Learning disabilities Cousin    Allergies  Allergen Reactions   Tape Other (See Comments)    Blisters   Buspirone  Other (See Comments)    Shaky/jittery   Fluoxetine Other (See Comments)    Ineffective; did not resolve depression/anxiety   Nsaids Other (See Comments)    History of ulcers   Review of Systems  Constitutional:  Negative for chills and fever.  HENT:  Negative for sore throat.   Respiratory:  Negative for cough and shortness of breath.   Cardiovascular:  Negative for chest pain, palpitations and leg swelling.  Gastrointestinal:  Negative for abdominal pain, blood in stool, constipation, diarrhea, nausea and vomiting.  Genitourinary:  Negative for dysuria and hematuria.  Musculoskeletal:  Positive for back pain (chronic lumbar back pain). Negative for myalgias.  Skin:  Negative for itching and rash.  Neurological:  Negative for dizziness and headaches.  Psychiatric/Behavioral:  Negative for depression and suicidal ideas.      Objective:     BP 132/74 (BP Location: Left Arm, Patient Position: Sitting, Cuff Size: Normal)   Pulse (!) 105   Ht 5\' 8"  (1.727 m)   Wt 185 lb (83.9 kg)   SpO2 97%   BMI 28.13 kg/m  BP Readings from Last 3 Encounters:  11/03/23 132/74  10/03/23 120/72  09/30/23 113/67   Physical Exam Vitals reviewed.  Constitutional:      General: She is not in acute distress.    Appearance: Normal appearance. She is not toxic-appearing.  HENT:     Head: Normocephalic and atraumatic.     Right Ear: External ear normal.     Left Ear:  External ear normal.     Nose: Nose normal. No congestion or rhinorrhea.     Mouth/Throat:     Mouth: Mucous membranes are moist.     Pharynx: Oropharynx is clear. No oropharyngeal exudate or posterior oropharyngeal erythema.  Eyes:     General: No scleral icterus.    Extraocular Movements: Extraocular movements intact.     Conjunctiva/sclera: Conjunctivae normal.     Pupils: Pupils are equal, round, and reactive to light.  Cardiovascular:     Rate and Rhythm: Normal rate and regular rhythm.     Pulses: Normal pulses.     Heart sounds: Normal heart sounds. No murmur heard.    No friction rub. No gallop.  Pulmonary:     Effort: Pulmonary effort is normal.     Breath sounds: Normal breath sounds. No wheezing, rhonchi or rales.  Abdominal:     General: Abdomen is flat. Bowel sounds are normal. There is no distension.     Palpations: Abdomen is soft.     Tenderness: There is no abdominal tenderness.  Musculoskeletal:        General: No swelling. Normal range of motion.     Cervical back: Normal range of motion.     Right lower leg: No edema.     Left lower leg: No edema.  Lymphadenopathy:     Cervical: No cervical adenopathy.  Skin:    General: Skin is warm and dry.     Capillary Refill: Capillary refill takes less than 2 seconds.     Coloration: Skin is not jaundiced.  Neurological:     General: No focal deficit present.     Mental Status: She is alert and oriented to person, place, and time.  Psychiatric:        Mood and Affect: Mood normal.        Behavior: Behavior normal.   Last CBC Lab Results  Component Value Date   WBC 14.1 (H) 09/29/2023   HGB 14.4 09/29/2023   HCT 44.3 09/29/2023   MCV 98.2 09/29/2023   MCH 31.9 09/29/2023   RDW 12.7 09/29/2023   PLT 303 09/29/2023   Last metabolic panel Lab Results  Component Value Date   GLUCOSE 94 09/29/2023   NA 136 09/29/2023   K 5.2 (H) 09/29/2023   CL 102 09/29/2023   CO2 25 09/29/2023   BUN 13 09/29/2023    CREATININE 1.04 (H) 09/29/2023   GFRNONAA >60 09/29/2023   CALCIUM  10.0 09/29/2023   PHOS 3.3 12/17/2022   PROT  6.5 05/01/2023   ALBUMIN 4.2 05/01/2023   LABGLOB 2.3 05/01/2023   AGRATIO 1.7 09/14/2020   BILITOT <0.2 05/01/2023   ALKPHOS 84 05/01/2023   AST 9 05/01/2023   ALT 8 05/01/2023   ANIONGAP 9 09/29/2023   Last lipids Lab Results  Component Value Date   CHOL 184 05/01/2023   HDL 46 05/01/2023   LDLCALC 121 (H) 05/01/2023   TRIG 93 05/01/2023   CHOLHDL 4.0 05/01/2023   Last hemoglobin A1c Lab Results  Component Value Date   HGBA1C 5.2 05/01/2023   Last thyroid  functions Lab Results  Component Value Date   TSH 0.730 05/01/2023   Last vitamin D  Lab Results  Component Value Date   VD25OH 62.2 05/01/2023   Last vitamin B12 and Folate Lab Results  Component Value Date   VITAMINB12 291 05/01/2023   FOLATE <2.0 (L) 05/01/2023     Assessment & Plan:   Problem List Items Addressed This Visit       Anxiety and depression   Mood remains stable and anxiety adequately controlled with sertraline , Vraylar , and clonazepam .  No medication changes are indicated today.      Frequent headaches   Well-controlled with Fioricet.  Refill provided today.      Chronic radicular lumbar pain   Her acute concern today is chronic lumbar back pain.  Previously referred to pain management and established care.  Unfortunately, she reports that she was recently dismissed from pain management due to an inappropriate UDS.  PDMP reviewed. She has most recently been prescribed oxycodone -acetaminophen  10-325 mg 4 times daily and Lyrica  75 mg twice daily.  -New pain management referral placed today.  No medication refills were provided.       Return in about 6 months (around 05/02/2024).   Tobi Fortes, MD

## 2023-11-03 NOTE — Patient Instructions (Signed)
 It was a pleasure to see you today.  Thank you for giving us  the opportunity to be involved in your care.  Below is a brief recap of your visit and next steps.  We will plan to see you again in 6 months.  Summary No medication changes today Fioricet refilled New pain management referral placed Follow up in 6 months

## 2023-11-03 NOTE — Assessment & Plan Note (Signed)
 Well-controlled with Fioricet.  Refill provided today.

## 2023-11-05 ENCOUNTER — Ambulatory Visit (HOSPITAL_COMMUNITY): Payer: Medicaid Other

## 2023-11-10 ENCOUNTER — Encounter: Payer: Self-pay | Admitting: Physical Medicine and Rehabilitation

## 2023-11-12 ENCOUNTER — Ambulatory Visit (HOSPITAL_COMMUNITY): Payer: Medicaid Other

## 2023-11-18 ENCOUNTER — Other Ambulatory Visit: Payer: Self-pay | Admitting: Internal Medicine

## 2023-11-18 ENCOUNTER — Encounter (INDEPENDENT_AMBULATORY_CARE_PROVIDER_SITE_OTHER): Payer: Self-pay

## 2023-11-18 ENCOUNTER — Encounter: Payer: Self-pay | Admitting: Internal Medicine

## 2023-11-18 ENCOUNTER — Other Ambulatory Visit (INDEPENDENT_AMBULATORY_CARE_PROVIDER_SITE_OTHER): Payer: Self-pay

## 2023-11-18 DIAGNOSIS — K2960 Other gastritis without bleeding: Secondary | ICD-10-CM

## 2023-11-18 DIAGNOSIS — R112 Nausea with vomiting, unspecified: Secondary | ICD-10-CM

## 2023-11-18 DIAGNOSIS — K279 Peptic ulcer, site unspecified, unspecified as acute or chronic, without hemorrhage or perforation: Secondary | ICD-10-CM

## 2023-11-18 DIAGNOSIS — G8929 Other chronic pain: Secondary | ICD-10-CM

## 2023-11-18 MED ORDER — PROMETHAZINE HCL 12.5 MG RE SUPP: 12.5000 mg | Freq: Three times a day (TID) | RECTAL | 1 refills | Status: DC | PRN

## 2023-11-18 MED ORDER — CLONAZEPAM 1 MG PO TABS: 1.0000 mg | ORAL_TABLET | Freq: Four times a day (QID) | ORAL | 0 refills | Status: DC | PRN

## 2023-11-23 ENCOUNTER — Other Ambulatory Visit: Payer: Self-pay | Admitting: Internal Medicine

## 2023-11-25 ENCOUNTER — Encounter: Payer: Self-pay | Admitting: Internal Medicine

## 2023-11-28 ENCOUNTER — Other Ambulatory Visit: Payer: Self-pay

## 2023-11-28 ENCOUNTER — Ambulatory Visit (HOSPITAL_COMMUNITY): Payer: Medicaid Other | Attending: Neurosurgery

## 2023-11-28 ENCOUNTER — Encounter (HOSPITAL_COMMUNITY): Payer: Self-pay

## 2023-11-28 DIAGNOSIS — R29898 Other symptoms and signs involving the musculoskeletal system: Secondary | ICD-10-CM | POA: Diagnosis not present

## 2023-11-28 DIAGNOSIS — M542 Cervicalgia: Secondary | ICD-10-CM | POA: Insufficient documentation

## 2023-11-28 DIAGNOSIS — M5459 Other low back pain: Secondary | ICD-10-CM | POA: Insufficient documentation

## 2023-11-28 NOTE — Therapy (Signed)
 OUTPATIENT PHYSICAL THERAPY THORACOLUMBAR/CERVICAL EVALUATION   Patient Name: Debbie Santana MRN: [id number] DOB:09/15/1991, 33 y.o., female Today's Date: 11/28/2023  END OF SESSION:  PT End of Session - 11/28/23 0859     Visit Number 1    Number of Visits 13    Date for PT Re-Evaluation 01/09/24    Authorization Type [place] MEDICAID [employer] BLUE    Authorization Time Period seeking auth    Progress Note Due on Visit 10    PT Start Time 0758    PT Stop Time 0845    PT Time Calculation (min) 47 min    Activity Tolerance Patient tolerated treatment well;Patient limited by pain    Behavior During Therapy [facility] for tasks assessed/performed             Past Medical History:  Diagnosis Date   Anxiety    Bacterial vaginosis    Bradycardia 04/17/2017   Chest pain on breathing 02/22/2020   Chronic back pain    Depression    Erosive gastritis 04/11/2017   Gastric ulcer    Genital warts    GERD (gastroesophageal reflux disease)    High cholesterol    History of gestational hypertension 05/21/2018   Dx intrapartum   History of kidney stones    Intertrigo 11/16/2019   Intractable abdominal pain 04/11/2017   Intractable nausea and vomiting 12/07/2018   Intractable vomiting 05/20/2020   Irregular intermenstrual bleeding 02/19/2019   Mental disorder    Migraine without aura and without status migrainosus, not intractable 02/19/2019   Nausea 05/10/2020   Nausea & vomiting 12/07/2018   Nexplanon in place 02/19/2019   Obesity 04/17/2017   Panic disorder    Peptic ulcer disease 04/13/2017   Pinched nerve 09/24/2019   Postpartum hypertension 06/23/2018   requiring norvasc, resolved at pp visit   Sciatica    Scoliosis    Tremor of both hands    Trichomoniasis    Vaginal Pap smear, abnormal    Wheezing    Past Surgical History:  Procedure Laterality Date   BACK SURGERY     ruptured disc.   BACK SURGERY  11/2021   BIOPSY  04/14/2017   Procedure: BIOPSY;  Surgeon: Malissa Hippo, MD;  [facility];  Service: Endoscopy;;  gastric   BIOPSY  05/22/2020   Procedure: BIOPSY;  Surgeon: Corbin Ade, MD;  [facility];  Service: Endoscopy;;   BIOPSY  08/29/2020   Procedure: BIOPSY;  Surgeon: Dolores Frame, MD;  [facility];  Service: Gastroenterology;;   BIOPSY  09/09/2020   Procedure: BIOPSY;  Surgeon: Lanelle Bal, DO;  [facility];  Service: Endoscopy;;  duodenum;antral   BIOPSY  11/21/2021   Procedure: BIOPSY;  Surgeon: Malissa Hippo, MD;  [facility];  Service: Endoscopy;;   BIOPSY  03/06/2023   Procedure: BIOPSY;  Surgeon: Dolores Frame, MD;  [facility];  Service: Gastroenterology;;   CERVICAL ABLATION N/A 12/02/2018   Procedure: LASER ABLATION OF CERVIX;  Surgeon: Lazaro Arms, MD;  [facility];  Service: Gynecology;  Laterality: N/A;   CHOLECYSTECTOMY N/A 04/17/2017   Procedure: LAPAROSCOPIC CHOLECYSTECTOMY;  Surgeon: Franky Macho, MD;  Location: [facility];  Service: General;  Laterality: N/A;   COLONOSCOPY WITH PROPOFOL N/A 08/29/2020   Procedure: COLONOSCOPY WITH PROPOFOL;  Surgeon: Dolores Frame, MD;  [facility];  Service: Gastroenterology;  Laterality: N/A;  11:15   COLPOSCOPY  ESOPHAGOGASTRODUODENOSCOPY (EGD) WITH PROPOFOL N/A 04/14/2017   Procedure: ESOPHAGOGASTRODUODENOSCOPY (EGD) WITH PROPOFOL;  Surgeon: Malissa Hippo, MD;  [facility];  Service: Endoscopy;  Laterality: N/A;   ESOPHAGOGASTRODUODENOSCOPY (EGD) WITH PROPOFOL N/A 05/22/2020   Procedure: ESOPHAGOGASTRODUODENOSCOPY (EGD) WITH PROPOFOL;  Surgeon: Corbin Ade, MD;  [facility];  Service: Endoscopy;  Laterality: N/A;   ESOPHAGOGASTRODUODENOSCOPY (EGD) WITH PROPOFOL N/A 09/09/2020   Procedure: ESOPHAGOGASTRODUODENOSCOPY (EGD) WITH PROPOFOL;  Surgeon: Lanelle Bal, DO;  [facility];  Service: Endoscopy;   Laterality: N/A;   ESOPHAGOGASTRODUODENOSCOPY (EGD) WITH PROPOFOL N/A 11/21/2020   Procedure: ESOPHAGOGASTRODUODENOSCOPY (EGD) WITH PROPOFOL;  Surgeon: Dolores Frame, MD;  [facility];  Service: Gastroenterology;  Laterality: N/A;  Am   ESOPHAGOGASTRODUODENOSCOPY (EGD) WITH PROPOFOL N/A 11/21/2021   Procedure: ESOPHAGOGASTRODUODENOSCOPY (EGD) WITH PROPOFOL;  Surgeon: Malissa Hippo, MD;  [facility];  Service: Endoscopy;  Laterality: N/A;  820   ESOPHAGOGASTRODUODENOSCOPY (EGD) WITH PROPOFOL N/A 03/06/2023   Procedure: ESOPHAGOGASTRODUODENOSCOPY (EGD) WITH PROPOFOL;  Surgeon: Dolores Frame, MD;  [facility];  Service: Gastroenterology;  Laterality: N/A;  11:45AM;ASA 2   HERNIA REPAIR Bilateral    inguinal   LASER ABLATION CONDOLAMATA N/A 12/02/2018   Procedure: LASER ABLATION CONDYLOMA ACCUMINATA LEFT AND RIGHT VULVA, PERINEUM AND PERIANAL (15 TOTAL);  Surgeon: Lazaro Arms, MD;  [facility]: [facility];  Service: Gynecology;  Laterality: N/A;   LUMBAR LAMINECTOMY/DECOMPRESSION MICRODISCECTOMY Right 12/03/2021   Procedure: MICRODISCECTOMY Lumbar four- five;  Surgeon: Tressie Stalker, MD;  Location: [facility];  Service: Neurosurgery;  Laterality: Right;   MOUTH SURGERY  09/2021   All top teeth and all wisdom teeth removed   MOUTH SURGERY  12/2021   POLYPECTOMY  08/29/2020   Procedure: POLYPECTOMY;  Surgeon: Dolores Frame, MD;  [facility];  Service: Gastroenterology;;   Patient Active Problem List   Diagnosis Date Noted   Screening examination for STD (sexually transmitted disease) 08/05/2023   Herpes 08/05/2023   Vaginal irritation 08/05/2023   Vaginal discharge 08/05/2023   Urinary incontinence 08/05/2023   Continuous urine leakage 07/15/2023   Essential tremor 05/01/2023   History of migraine headaches 05/01/2023   History of UTI 02/18/2023   Hematuria 02/18/2023   Trichomoniasis 02/04/2023   Substance  abuse (HCC) 12/17/2022   Bartholin's gland cyst 12/17/2022   Dysmenorrhea 10/24/2022   Dyspareunia, female 10/24/2022   Pelvic pain 10/24/2022   Helicobacter positive gastritis 05/07/2022   Erosive gastropathy 05/07/2022   Boil 04/12/2022   Positive H. 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) 08/23/2020   Post laminectomy syndrome 08/23/2020   Frequent headaches 08/10/2020   Nexplanon in place 08/10/2020   Irregular intermenstrual bleeding 08/10/2020   Obesity (BMI 30-39.9) 05/20/2020   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   Panic attack 05/18/2020   Epigastric pain 05/18/2020   Agitated 05/18/2020   Gastroesophageal reflux disease 05/10/2020   Alternating constipation and diarrhea 05/10/2020   Bilateral wheezing 02/22/2020   Sleep disturbance 02/22/2020   Anxiety and depression 02/19/2019   Intractable nausea and vomiting 12/07/2018   Low  back pain 12/07/2018   Chronic diarrhea 12/07/2018   Nausea & vomiting 12/07/2018   Condyloma 04/15/2018   Abnormal Pap smear of cervix 11/04/2017   Reflux esophagitis 04/17/2017   Tobacco abuse 04/17/2017   Gastro-esophageal reflux disease with esophagitis 04/17/2017   Peptic ulcer disease 04/13/2017   Erosive gastritis 04/11/2017   Leukocytosis 04/11/2017   Anxiety 04/11/2017    PCP: Billie Lade, MD   REFERRING PROVIDER: Tressie Stalker, MD  REFERRING DIAG: M54.2 (ICD-10-CM) - Cervicalgia M51.16 (ICD-10-CM) - Herniation of lumbar intervertebral disc with radiculopathy  Rationale for Evaluation and  Treatment: Rehabilitation  THERAPY DIAG:  Other low back pain  Neck pain  Weakness of left lower extremity  ONSET DATE: over 6 months ago  SUBJECTIVE:                                                                                                                                                                                           SUBJECTIVE STATEMENT: Pt feels okay and slept okay last night. Pt states she is supposed to be going to a new pain clinic sometime. Pt reports cervical spine is bone on bone.  PERTINENT HISTORY:  History of two low back surgeries, one a couple of years ago, the other when pt was 16.   PAIN:  Are you having pain? Yes: NPRS scale: 8/10 Pain location: lower cervical spine, lumbar spine, legs ache occasionally Pain description: dull, occasionally sharp spurts (low back) and sharp (neck) Aggravating factors: working around the house, arm movements and lifting for the neck, lifting from floor and standing/sitting for long periods of time for the low back Relieving factors: heat, [place], laying down flat, keeping arms above head  PRECAUTIONS: None  RED FLAGS: None   WEIGHT BEARING RESTRICTIONS: No  FALLS:  Has patient fallen in last 6 months? Yes. Number of falls 1, just tripped  OCCUPATION: homemaker  PATIENT GOALS: decrease the pain in neck and low back, everyday tasks with decreased breaks.  NEXT MD VISIT: none, waiting to complete therapy  OBJECTIVE:  Note: Objective measures were completed at Evaluation unless otherwise noted.  PATIENT SURVEYS:  Modified Oswestry 20/50  NDI 15/50  COGNITION: Overall cognitive status: Within functional limits for tasks assessed     SENSATION: Hands go numb almost everyday    POSTURE: rounded shoulders, forward head, decreased lumbar lordosis, and increased thoracic kyphosis  PALPATION: Tenderness to palpation of paraspinal musculature from C6-T4 and L1-L4. Spinous processes sensitive to touch as  well of the same spinal segments with decreased mobility. CERVICAL ROM:   AROM eval  Flexion 40  Extension 35  Right lateral  flexion 45  Left lateral flexion 40, pain  Right rotation 64  Left rotation 44, pain   (Blank rows = not tested) LUMBAR ROM:  AROM eval  Flexion 50, pain  Extension 15, pain  Right lateral flexion 16, pain  Left lateral flexion 15, pain  Right rotation 75%, pain on left side  Left rotation 100%   (Blank rows = not tested)   LOWER EXTREMITY MMT:    MMT Right eval Left eval  Hip flexion 4- 4-  Hip extension 4- 4-  Hip abduction 4+ 4-  Hip adduction    Hip internal rotation    Hip external rotation    Knee flexion 4+ 4  Knee extension 4+ 4  Ankle dorsiflexion 5 5  Ankle plantarflexion    Ankle inversion    Ankle eversion     (Blank rows = not tested)  CERVICAL SPECIAL TESTS:  Spurling's Compression test, negative bilaterally for radicular symptoms, pt reports neck pain during compression of left cervical spine  FUNCTIONAL TESTS:  2 minute walk test: 400 feet   30 second chair stand test: 6.5 reps GAIT: Distance walked: 400 Assistive device utilized: None Level of assistance: Complete Independence Comments: Pt demonstrates abnormally decreased gait speed and demonstrates mild varying of stride lengths especially during making 90 degree turns during .  TREATMENT DATE:  11/28/2023  Evaluation/Assessment of neck and back Therapeutic Exercise: -Supine PPT 1 set of 8 reps -Supine LTR, 1 set of 10 reps (pt reports this movement is too painful even after being instructed to stay short of painful ROM -Seated Chin Tucks 1 set of 10 reps -Seated Scapular Retractions 1 set of 10 reps                                                                                                                                 PATIENT EDUCATION:  Education details: Pt was educated on findings of PT evaluation, prognosis, frequency of therapy visits and  rationale, attendance policy, and HEP if given.   Person educated: Patient Education method: Explanation, Demonstration, Tactile cues, Verbal cues, and Handouts Education comprehension: verbalized understanding, returned demonstration, verbal cues required, and tactile cues required  HOME EXERCISE PROGRAM: Access Code: [email or web address] URL: [email or web address] Date: 11/28/2023 Prepared by: Luz Lex  Exercises - Seated Cervical Retraction  - 1 x daily - 7 x weekly - 3 sets - 10 reps - Seated Scapular Retraction  - 1 x daily - 7 x weekly - 3 sets - 10 reps - Supine Posterior Pelvic Tilt  - 1 x daily - 7 x weekly - 3 sets - 10 reps  ASSESSMENT:  CLINICAL IMPRESSION: Patient is a 33 y.o. female who was seen today for physical therapy evaluation and treatment for M54.2 (ICD-10-CM) - Cervicalgia M51.16 (ICD-10-CM) - Herniation of lumbar intervertebral disc with radiculopathy. Pt demonstrates decreased gait speed and decreased expected movement speed with functional transfers. Pt demonstrates guarded para spinal musculature  and decreased tolerance joint movement assessment from C6-T4 and L1-L4 spinal segments. Pt also was not able to tolerate supine LTR exercise due to pain although instructions were given to avoid painful ROM. Patient would benefit from skilled physical therapy for increased pain free cervical, thoracic and lumbar spine ROM, increased LE strength, and improve posture for improved quality of life, and continued progress towards therapy goals.   OBJECTIVE IMPAIRMENTS: Abnormal gait, decreased activity tolerance, decreased endurance, decreased mobility, decreased ROM, decreased strength, and pain.   ACTIVITY LIMITATIONS: carrying, lifting, bending, sitting, standing, squatting, stairs, and transfers  PARTICIPATION LIMITATIONS: meal prep, cleaning, laundry, driving, community activity, and yard work  PERSONAL FACTORS: Age and 1-2 comorbidities: Anxiety, Chronic back  pain  are also affecting patient's functional outcome.   REHAB POTENTIAL: Good  CLINICAL DECISION MAKING: Stable/uncomplicated  EVALUATION COMPLEXITY: Low   GOALS: Goals reviewed with patient? No  SHORT TERM GOALS: Target date: 12/19/23  Pt will be independent with HEP in order to demonstrate participation in Physical Therapy POC.  Baseline: Goal status: INITIAL  2.  Pt will report 5/10 pain with mobility in order to demonstrate improved pain with ADLs.  Baseline:  Goal status: INITIAL  LONG TERM GOALS: Target date: 01/09/24  Pt will improve LLE strength to at least 4+/5 in order to demonstrate improved functional strength to return to desired activities.  Baseline: See above Goal status: INITIAL  2.  Pt will improve 2 MWT by 140 feet in order to demonstrate improved functional ambulatory capacity in community setting.  Baseline: 400 feet Goal status: INITIAL  3.  Pt will improve Modified Oswestry score to <5 in order to demonstrate improved pain with functional goals and outcomes. Baseline: 15/50 Goal status: INITIAL  4.  Pt will report 3/10 pain with mobility in order to demonstrate reduced pain with ADLs lasting greater than 30 minutes.  Baseline: 7/10 Goal status: INITIAL   PLAN:  PT FREQUENCY: 2x/week  PT DURATION: 6 weeks  PLANNED INTERVENTIONS: 97110-Therapeutic exercises, 97530- Therapeutic activity, 97112- Neuromuscular re-education, 97535- Self Care, 47829- Manual therapy, [phone]- Gait training, Balance training, Stair training, Joint mobilization, Joint manipulation, Spinal manipulation, Spinal mobilization, Cryotherapy, and Moist heat.  PLAN FOR NEXT SESSION: Review HEP, progress core strengthening   Luz Lex, PT, DPT [facility] Office: [phone]   Managed Medicaid Authorization Request  Visit Dx Codes: M54.59, M54.2, R29.898,  M54.2, M51.16   Functional Tool Score: Modified Oswestry 20/50; NDI 15/50  For all  possible CPT codes, reference the Planned Interventions line above.     Check all conditions that are expected to impact treatment: {Conditions expected to impact treatment:Musculoskeletal disorders   If treatment provided at initial evaluation, no treatment charged due to lack of authorization.

## 2023-12-07 ENCOUNTER — Other Ambulatory Visit: Payer: Self-pay | Admitting: Internal Medicine

## 2023-12-08 ENCOUNTER — Encounter: Payer: Self-pay | Admitting: Internal Medicine

## 2023-12-08 MED ORDER — BUTALBITAL-APAP-CAFFEINE 50-325-40 MG PO TABS: 1.0000 | ORAL_TABLET | Freq: Two times a day (BID) | ORAL | 0 refills | Status: DC | PRN

## 2023-12-09 ENCOUNTER — Encounter (HOSPITAL_COMMUNITY): Admitting: Physical Therapy

## 2023-12-10 ENCOUNTER — Telehealth: Payer: Self-pay | Admitting: Internal Medicine

## 2023-12-10 NOTE — Telephone Encounter (Signed)
 Done

## 2023-12-10 NOTE — Telephone Encounter (Signed)
 Patient asking to see you after Dr Glenna Fellows the practice, currently you see her mother. She rather see a MD instead of a NP.

## 2023-12-11 ENCOUNTER — Telehealth (HOSPITAL_COMMUNITY): Payer: Self-pay | Admitting: Physical Therapy

## 2023-12-11 ENCOUNTER — Encounter (HOSPITAL_COMMUNITY): Admitting: Physical Therapy

## 2023-12-11 NOTE — Telephone Encounter (Signed)
 Pt did not show for appt.  Spoke to CG who states she is sick and that is why she did not make appt . Reminded of next appt and states she will have her call if she is unable to make it.   Lurena Nida, PTA/CLT [facility] Ph: [phone]

## 2023-12-12 ENCOUNTER — Other Ambulatory Visit: Payer: Self-pay

## 2023-12-12 ENCOUNTER — Emergency Department (HOSPITAL_COMMUNITY)

## 2023-12-12 ENCOUNTER — Encounter (HOSPITAL_COMMUNITY): Payer: Self-pay | Admitting: *Deleted

## 2023-12-12 ENCOUNTER — Emergency Department (HOSPITAL_COMMUNITY)
Admission: EM | Admit: 2023-12-12 | Discharge: 2023-12-12 | Disposition: A | Discharge status: Home / Self Care | Attending: Emergency Medicine | Admitting: Emergency Medicine

## 2023-12-12 DIAGNOSIS — R112 Nausea with vomiting, unspecified: Secondary | ICD-10-CM | POA: Diagnosis present

## 2023-12-12 DIAGNOSIS — R197 Diarrhea, unspecified: Secondary | ICD-10-CM | POA: Insufficient documentation

## 2023-12-12 DIAGNOSIS — E876 Hypokalemia: Secondary | ICD-10-CM | POA: Insufficient documentation

## 2023-12-12 DIAGNOSIS — K6389 Other specified diseases of intestine: Secondary | ICD-10-CM | POA: Diagnosis not present

## 2023-12-12 DIAGNOSIS — M549 Dorsalgia, unspecified: Secondary | ICD-10-CM | POA: Insufficient documentation

## 2023-12-12 DIAGNOSIS — R1030 Lower abdominal pain, unspecified: Secondary | ICD-10-CM | POA: Diagnosis not present

## 2023-12-12 DIAGNOSIS — R109 Unspecified abdominal pain: Secondary | ICD-10-CM | POA: Diagnosis not present

## 2023-12-12 LAB — URINALYSIS, ROUTINE W REFLEX MICROSCOPIC
Bilirubin Urine: NEGATIVE
Glucose, UA: NEGATIVE mg/dL
Ketones, ur: 5 mg/dL — AB
Leukocytes,Ua: NEGATIVE
Nitrite: NEGATIVE
Protein, ur: 100 mg/dL — AB
RBC / HPF: >50 RBC/hpf (ref 0–5)
Specific Gravity, Urine: >1.046 — ABNORMAL HIGH (ref 1.005–1.030)
pH: 5 (ref 5.0–8.0)

## 2023-12-12 LAB — COMPREHENSIVE METABOLIC PANEL
ALT: 13 U/L (ref 0–44)
AST: 17 U/L (ref 15–41)
Albumin: 3.7 g/dL (ref 3.5–5.0)
Alkaline Phosphatase: 50 U/L (ref 38–126)
Anion gap: 9 (ref 5–15)
BUN: 11 mg/dL (ref 6–20)
CO2: 20 mmol/L — ABNORMAL LOW (ref 22–32)
Calcium: 9.3 mg/dL (ref 8.9–10.3)
Chloride: 109 mmol/L (ref 98–111)
Creatinine, Ser: 0.62 mg/dL (ref 0.44–1.00)
GFR, Estimated: >60 mL/min (ref >60)
Glucose, Bld: 134 mg/dL — ABNORMAL HIGH (ref 70–99)
Potassium: 3.1 mmol/L — ABNORMAL LOW (ref 3.5–5.1)
Sodium: 138 mmol/L (ref 135–145)
Total Bilirubin: 0.4 mg/dL (ref 0.0–1.2)
Total Protein: 7.4 g/dL (ref 6.5–8.1)

## 2023-12-12 LAB — CBC
HCT: 39 % (ref 36.0–46.0)
Hemoglobin: 13.3 g/dL (ref 12.0–15.0)
MCH: 32.3 pg (ref 26.0–34.0)
MCHC: 34.1 g/dL (ref 30.0–36.0)
MCV: 94.7 fL (ref 80.0–100.0)
Platelets: 187 10*3/uL (ref 150–400)
RBC: 4.12 MIL/uL (ref 3.87–5.11)
RDW: 12.8 % (ref 11.5–15.5)
WBC: 6.3 10*3/uL (ref 4.0–10.5)
nRBC: 0 % (ref 0.0–0.2)

## 2023-12-12 LAB — C DIFFICILE QUICK SCREEN W PCR REFLEX
C Diff antigen: NEGATIVE
C Diff interpretation: NOT DETECTED
C Diff toxin: NEGATIVE

## 2023-12-12 LAB — PREGNANCY, URINE: Preg Test, Ur: NEGATIVE

## 2023-12-12 LAB — LIPASE, BLOOD: Lipase: 25 U/L (ref 11–51)

## 2023-12-12 MED ORDER — OXYCODONE-ACETAMINOPHEN 5-325 MG PO TABS
1.0000 | ORAL_TABLET | Freq: Once | ORAL | Status: AC
  Administered 2023-12-12: 1 via ORAL
  Filled 2023-12-12: qty 1

## 2023-12-12 MED ORDER — LOPERAMIDE HCL 2 MG PO CAPS: 2.0000 mg | ORAL_CAPSULE | Freq: Four times a day (QID) | ORAL | 0 refills | Status: DC | PRN

## 2023-12-12 MED ORDER — SODIUM CHLORIDE 0.9 % IV BOLUS
1000.0000 mL | Freq: Once | INTRAVENOUS | Status: AC
  Administered 2023-12-12: 1000 mL via INTRAVENOUS

## 2023-12-12 MED ORDER — FENTANYL CITRATE PF 50 MCG/ML IJ SOSY
50.0000 ug | PREFILLED_SYRINGE | Freq: Once | INTRAMUSCULAR | Status: AC
  Administered 2023-12-12: 50 ug via INTRAVENOUS
  Filled 2023-12-12: qty 1

## 2023-12-12 MED ORDER — ONDANSETRON HCL 4 MG PO TABS: 4.0000 mg | ORAL_TABLET | Freq: Three times a day (TID) | ORAL | 0 refills | Status: DC | PRN

## 2023-12-12 MED ORDER — IOHEXOL 300 MG/ML  SOLN
100.0000 mL | Freq: Once | INTRAMUSCULAR | Status: AC | PRN
  Administered 2023-12-12: 100 mL via INTRAVENOUS

## 2023-12-12 MED ORDER — AZITHROMYCIN 250 MG PO TABS: 500.0000 mg | ORAL_TABLET | Freq: Every day | ORAL | 0 refills | Status: DC

## 2023-12-12 MED ORDER — ONDANSETRON HCL 4 MG/2ML IJ SOLN
4.0000 mg | Freq: Once | INTRAMUSCULAR | Status: AC
  Administered 2023-12-12: 4 mg via INTRAVENOUS
  Filled 2023-12-12: qty 2

## 2023-12-12 NOTE — ED Provider Notes (Signed)
 [place] EMERGENCY DEPARTMENT AT [facility] Provider Note   CSN: [id number] Arrival date & time: 12/12/23  1114     History  Chief Complaint  Patient presents with   Diarrhea    Debbie Santana is a 33 y.o. female.  She is here with a complaint of diarrhea for 2-3 weeks associated with some back pain and lower abdominal cramping pain nausea.  She was on a course of antibiotics about a month ago.  She has tried nothing for her diarrhea.  She has a primary care doctor but has not seen them for this.  She has not seen any blood in the diarrhea.  No recent travel or sick contacts.  The history is provided by the patient.  Diarrhea Quality:  Watery Severity:  Moderate Onset quality:  Gradual Duration:  2 weeks Timing:  Constant Progression:  Unchanged Relieved by:  None tried Worsened by:  Nothing Ineffective treatments:  None tried Associated symptoms: abdominal pain and vomiting   Associated symptoms: no recent cough and no fever   Risk factors: recent antibiotic use        Home Medications Prior to Admission medications   Medication Sig Start Date End Date Taking? Authorizing Provider  Acetaminophen-Caff-Pyrilamine (MIDOL COMPLETE) 500-60-15 MG TABS Take 2 tablets by mouth 3 (three) times daily as needed (pain.).    [provider]  albuterol (VENTOLIN HFA) 108 (90 Base) MCG/ACT inhaler Inhale 2 puffs into the lungs every 6 (six) hours as needed for wheezing or shortness of breath. 09/11/20   Catarina Hartshorn, MD  butalbital-acetaminophen-caffeine (FIORICET) [phone] MG tablet Take 1 tablet by mouth every 12 (twelve) hours as needed for migraine. 12/08/23   Billie Lade, MD  celecoxib (CELEBREX) 200 MG capsule Take 1 capsule (200 mg total) by mouth 2 (two) times daily. 09/29/23   Harris, Cammy Copa, PA-C  clonazePAM (KLONOPIN) 1 MG tablet Take 1 tablet (1 mg total) by mouth 4 (four) times daily as needed for anxiety. 11/18/23   Anabel Halon, MD   diphenhydrAMINE (BENADRYL) 25 MG tablet Take 25 mg by mouth every 6 (six) hours as needed for allergies.    [provider]  etonogestrel (NEXPLANON) 68 MG IMPL implant 68 mg by Subdermal route once.    [provider]  hyoscyamine (ANASPAZ) 0.125 MG TBDP disintergrating tablet Place 0.125 mg under the tongue every 6 (six) hours as needed for cramping.    [provider]  naloxone Jonelle Sports) 2 MG/2ML injection  01/21/23   [provider]  ondansetron (ZOFRAN) 8 MG tablet SMARTSIG:1 Tablet(s) By Mouth 1-3 Times Daily 04/07/23   [provider]  oxyCODONE-acetaminophen (PERCOCET) 10-325 MG tablet Take 1 tablet by mouth every 6 (six) hours as needed. 08/02/23   Billie Lade, MD  pregabalin (LYRICA) 75 MG capsule Take 75 mg by mouth 3 (three) times daily. 08/09/23   [provider]  promethazine (PHENERGAN) 12.5 MG suppository Place 1 suppository (12.5 mg total) rectally every 8 (eight) hours as needed for nausea or vomiting. 11/18/23   Carlan, Chelsea L, NP  Sertraline HCl 200 MG CAPS Take 200 mg by mouth daily after supper.    [provider]  solifenacin (VESICARE) 10 MG tablet Take 1 tablet (10 mg total) by mouth daily. 07/15/23   Adline Potter, NP  SPIRIVA RESPIMAT 2.5 MCG/ACT AERS Inhale 2 each into the lungs daily as needed (respiratory issues.). 09/24/22   [provider]  sucralfate (CARAFATE) 1 g tablet  Take 1 tablet (1 g total) by mouth 3 (three) times daily before meals. 11/06/21   Carlan, Chelsea L, NP  traZODone (DESYREL) 50 MG tablet Take 100 mg by mouth at bedtime. 01/21/23   [provider]  valACYclovir (VALTREX) 1000 MG tablet Take 1 tablet (1,000 mg total) by mouth 2 (two) times daily. 08/05/23   Cyril Mourning A, NP  VRAYLAR 1.5 MG capsule Take 1.5 mg by mouth every evening. 09/27/22   [provider]      Allergies    Tape, Buspirone, Fluoxetine, and Nsaids    Review of Systems   Review  of Systems  Constitutional:  Negative for fever.  Respiratory:  Negative for shortness of breath.   Cardiovascular:  Negative for chest pain.  Gastrointestinal:  Positive for abdominal pain, diarrhea, nausea and vomiting.  Genitourinary:  Negative for dysuria.  Musculoskeletal:  Positive for back pain.    Physical Exam Updated Vital Signs BP 105/73 (BP Location: Right Arm)   Pulse 98   Temp 98.4 F (36.9 C) (Oral)   Resp 16   Ht 5\' 8"  (1.727 m)   Wt 78.5 kg   SpO2 98%   BMI 26.30 kg/m  Physical Exam Vitals and nursing note reviewed.  Constitutional:      General: She is not in acute distress.    Appearance: Normal appearance. She is well-developed.  HENT:     Head: Normocephalic and atraumatic.  Eyes:     Conjunctiva/sclera: Conjunctivae normal.  Cardiovascular:     Rate and Rhythm: Normal rate and regular rhythm.     Heart sounds: No murmur heard. Pulmonary:     Effort: Pulmonary effort is normal. No respiratory distress.     Breath sounds: Normal breath sounds.  Abdominal:     Palpations: Abdomen is soft.     Tenderness: There is no abdominal tenderness. There is no guarding or rebound.  Musculoskeletal:        General: No deformity.     Cervical back: Neck supple.  Skin:    General: Skin is warm and dry.     Capillary Refill: Capillary refill takes less than 2 seconds.  Neurological:     General: No focal deficit present.     Mental Status: She is alert.     ED Results / Procedures / Treatments   Labs (all labs ordered are listed, but only abnormal results are displayed) Labs Reviewed  GASTROINTESTINAL PANEL BY PCR, STOOL (REPLACES STOOL CULTURE) - Abnormal; Notable for the following components:      Result Value   Norovirus GI/GII DETECTED (*)    All other components within normal limits  COMPREHENSIVE METABOLIC PANEL - Abnormal; Notable for the following components:   Potassium 3.1 (*)    CO2 20 (*)    Glucose, Bld 134 (*)    All other components  within normal limits  URINALYSIS, ROUTINE W REFLEX MICROSCOPIC - Abnormal; Notable for the following components:   Color, Urine AMBER (*)    APPearance HAZY (*)    Specific Gravity, Urine >1.046 (*)    Hgb urine dipstick LARGE (*)    Ketones, ur 5 (*)    Protein, ur 100 (*)    Bacteria, UA RARE (*)    All other components within normal limits  C DIFFICILE QUICK SCREEN W PCR REFLEX    LIPASE, BLOOD  CBC  PREGNANCY, URINE    EKG None  Radiology CT ABDOMEN PELVIS W CONTRAST Result Date: 12/12/2023 CLINICAL DATA:  Abdomen pain diarrhea nausea for 2 weeks EXAM: CT ABDOMEN AND PELVIS WITH CONTRAST TECHNIQUE: Multidetector CT imaging of the abdomen and pelvis was performed using the standard protocol following bolus administration of intravenous contrast. RADIATION DOSE REDUCTION: This exam was performed according to the departmental dose-optimization program which includes automated exposure control, adjustment of the mA and/or kV according to patient size and/or use of iterative reconstruction technique. CONTRAST:  OMNIPAQUE IOHEXOL 300 MG/ML  SOLN COMPARISON:  CT 01/13/2023 FINDINGS: Lower chest: No acute abnormality. Hepatobiliary: No focal liver abnormality is seen. Status post cholecystectomy. No biliary dilatation. Pancreas: Unremarkable. No pancreatic ductal dilatation or surrounding inflammatory changes. Spleen: Normal in size without focal abnormality. Adrenals/Urinary Tract: Adrenal glands are unremarkable. Kidneys are normal, without renal calculi, focal lesion, or hydronephrosis. Bladder is unremarkable. Stomach/Bowel: Stomach is nonenlarged. Mild fluid distension of proximal duodenum but no obstructive features. No acute bowel wall thickening. Few fluid-filled nondilated loops of small bowel in the right lower quadrant. Negative appendix. Colon is decompressed. Vascular/Lymphatic: No significant vascular findings are present. No enlarged abdominal or pelvic lymph nodes.  Reproductive: Uterus and bilateral adnexa are unremarkable. Slightly thick-walled 2.2 cm left labial/vaginal cyst. This is unchanged. Other: Negative for pelvic effusion or free air Musculoskeletal: No acute or suspicious osseous abnormality IMPRESSION: Negative for bowel obstruction or acute bowel wall thickening. Few fluid-filled nondilated loops of small bowel in the right lower quadrant, possible enteritis. Electronically Signed   By: Jasmine Pang M.D.   On: 12/12/2023 15:20    Procedures Procedures    Medications Ordered in ED Medications  fentaNYL (SUBLIMAZE) injection 50 mcg (50 mcg Intravenous Given 12/12/23 1322)  sodium chloride 0.9 % bolus 1,000 mL (0 mLs Intravenous Stopped 12/12/23 1449)  ondansetron (ZOFRAN) injection 4 mg (4 mg Intravenous Given 12/12/23 1322)  iohexol (OMNIPAQUE) 300 MG/ML solution 100 mL (100 mLs Intravenous Contrast Given 12/12/23 1328)  oxyCODONE-acetaminophen (PERCOCET/ROXICET) 5-325 MG per tablet 1 tablet (1 tablet Oral Given 12/12/23 1557)    ED Course/ Medical Decision Making/ A&P Clinical Course as of 12/13/23 0908  Fri Dec 12, 2023  1525 labs coming back with mildly low potassium.  CT chest shows fluid-filled bowel loops consistent with enteritis.  Because she has been having diarrhea for 2 weeks offered her antibiotics for possible infectious course.  She is also going to do Imodium and a prescription for Zofran. [MB]    Clinical Course User Index [MB] Terrilee Files, MD                                 Medical Decision Making Amount and/or Complexity of Data Reviewed Labs: ordered. Radiology: ordered.  Risk Prescription drug management.   This patient complains of diarrhea; this involves an extensive number of treatment Options and is a complaint that carries with it a high risk of complications and morbidity. The differential includes infectious diarrhea, C. difficile, dehydration, metabolic derangement  I ordered, reviewed and  interpreted labs, which included CBC normal, chemistries with low potassium and bicarb, urinalysis possible signs of infection, C. difficile negative, stool studies pending at time of discharge I ordered medication IV fluids and pain medicine nausea medicine and reviewed PMP when indicated. I ordered imaging studies which included CT abdomen and pelvis and I independently    visualized and interpreted imaging which showed fluid-filled loops consistent with enteritis Previous records obtained and reviewed in epic including recent PCP notes  Social determinants  considered, multiple barriers including food insecurity partner violence tobacco use depression  Critical Interventions: None  After the interventions stated above, I reevaluated the patient and found patient to be symptomatically improved no indications for admission.  Admission and further testing considered, Will start her on antidiarrheal medicine.  Will cover with antibiotics for possible bacterial infection.  She understands to follow-up with her PCP for further evaluation.         Final Clinical Impression(s) / ED Diagnoses Final diagnoses:  Nausea vomiting and diarrhea  Hypokalemia    Rx / DC Orders ED Discharge Orders          Ordered    loperamide (IMODIUM) 2 MG capsule  4 times daily PRN        12/12/23 1529    ondansetron (ZOFRAN) 4 MG tablet  Every 8 hours PRN        12/12/23 1529    azithromycin (ZITHROMAX) 250 MG tablet  Daily        12/12/23 1529              Terrilee Files, MD 12/13/23 [phone]

## 2023-12-12 NOTE — ED Triage Notes (Signed)
 Pt with diarrhea with nausea x 2 weeks. Watery per pt. Pt admits to being on antibiotics about a month ago.

## 2023-12-12 NOTE — Discharge Instructions (Signed)
 You are seen in the emergency department for nausea vomiting diarrhea.  You had lab work and a CAT scan that did not show an obvious explanation for your symptoms.  Your stool studies were still pending at time of discharge and should result in the next day or 2.  We are starting you on some medication for nausea and for diarrhea.  We are also starting you on an antibiotic.  Please continue to stay well-hydrated.  Follow-up with your regular doctor.  Return to the emergency department if any worsening or concerning symptoms

## 2023-12-15 LAB — GASTROINTESTINAL PANEL BY PCR, STOOL (REPLACES STOOL CULTURE)

## 2023-12-15 NOTE — ED Notes (Signed)
 [facility] called to state pt had a positive gastric panel for the norovirus; Dr. Jeraldine Loots informed and stated pt does not need to come back

## 2023-12-16 ENCOUNTER — Encounter (HOSPITAL_COMMUNITY)

## 2023-12-16 ENCOUNTER — Encounter (HOSPITAL_COMMUNITY): Payer: Self-pay

## 2023-12-16 NOTE — Therapy (Signed)
 [facility] Outpatient Rehabilitation at [facility] [address] [place], [place], [address] Phone: [phone]   Fax:  [phone]  Patient Details  Name: Debbie Santana MRN: [id number] Date of Birth: [birth year] Referring Provider:  No ref. provider found  Encounter Date: 12/16/2023  Pt called regarding no show #2. Pt's father answered, reporting that she was sick. Questions need for PT regarding her back issues and discussed that in clinic conversation regarding need for skilled PT is best suit. Pt's father talking about his own back/neck  problems. Pt informed/reminded of no show policy. Taking out rest of this week's appointment and informed pt's father of next appointment next week.   Nelida Meuse, PT 12/16/2023, 10:15 AM  [facility] Outpatient Rehabilitation at [facility] [address] [place], [place], [address] Phone: [phone]   Fax:  [phone]

## 2023-12-17 ENCOUNTER — Encounter: Payer: Medicaid Other | Admitting: Physical Medicine and Rehabilitation

## 2023-12-18 ENCOUNTER — Encounter (HOSPITAL_COMMUNITY)

## 2023-12-22 ENCOUNTER — Encounter (HOSPITAL_COMMUNITY): Payer: Self-pay

## 2023-12-22 ENCOUNTER — Ambulatory Visit (HOSPITAL_COMMUNITY)

## 2023-12-22 DIAGNOSIS — M542 Cervicalgia: Secondary | ICD-10-CM

## 2023-12-22 DIAGNOSIS — M5459 Other low back pain: Secondary | ICD-10-CM

## 2023-12-22 DIAGNOSIS — R29898 Other symptoms and signs involving the musculoskeletal system: Secondary | ICD-10-CM | POA: Diagnosis not present

## 2023-12-22 NOTE — Therapy (Signed)
 OUTPATIENT PHYSICAL THERAPY THORACOLUMBAR/CERVICAL EVALUATION   Patient Name: Debbie Santana MRN: [id number] DOB:31-Aug-1991, 33 y.o., female Today's Date: 12/22/2023  END OF SESSION:  PT End of Session - 12/22/23 0757     Visit Number 2    Number of Visits 13    Date for PT Re-Evaluation 01/09/24    Authorization Type [place] MEDICAID [employer] BLUE    Progress Note Due on Visit 10    PT Start Time 0800    PT Stop Time 0841    PT Time Calculation (min) 41 min    Activity Tolerance Patient tolerated treatment well;Patient limited by pain    Behavior During Therapy [facility] for tasks assessed/performed              Past Medical History:  Diagnosis Date   Anxiety    Bacterial vaginosis    Bradycardia 04/17/2017   Chest pain on breathing 02/22/2020   Chronic back pain    Depression    Erosive gastritis 04/11/2017   Gastric ulcer    Genital warts    GERD (gastroesophageal reflux disease)    High cholesterol    History of gestational hypertension 05/21/2018   Dx intrapartum   History of kidney stones    Intertrigo 11/16/2019   Intractable abdominal pain 04/11/2017   Intractable nausea and vomiting 12/07/2018   Intractable vomiting 05/20/2020   Irregular intermenstrual bleeding 02/19/2019   Mental disorder    Migraine without aura and without status migrainosus, not intractable 02/19/2019   Nausea 05/10/2020   Nausea & vomiting 12/07/2018   Nexplanon in place 02/19/2019   Obesity 04/17/2017   Panic disorder    Peptic ulcer disease 04/13/2017   Pinched nerve 09/24/2019   Postpartum hypertension 06/23/2018   requiring norvasc, resolved at pp visit   Sciatica    Scoliosis    Tremor of both hands    Trichomoniasis    Vaginal Pap smear, abnormal    Wheezing    Past Surgical History:  Procedure Laterality Date   BACK SURGERY     ruptured disc.   BACK SURGERY  11/2021   BIOPSY  04/14/2017   Procedure: BIOPSY;  Surgeon: Malissa Hippo, MD;  [facility];   Service: Endoscopy;;  gastric   BIOPSY  05/22/2020   Procedure: BIOPSY;  Surgeon: Corbin Ade, MD;  [facility];  Service: Endoscopy;;   BIOPSY  08/29/2020   Procedure: BIOPSY;  Surgeon: Dolores Frame, MD;  [facility];  Service: Gastroenterology;;   BIOPSY  09/09/2020   Procedure: BIOPSY;  Surgeon: Lanelle Bal, DO;  [facility];  Service: Endoscopy;;  duodenum;antral   BIOPSY  11/21/2021   Procedure: BIOPSY;  Surgeon: Malissa Hippo, MD;  [facility];  Service: Endoscopy;;   BIOPSY  03/06/2023   Procedure: BIOPSY;  Surgeon: Dolores Frame, MD;  [facility];  Service: Gastroenterology;;   CERVICAL ABLATION N/A 12/02/2018   Procedure: LASER ABLATION OF CERVIX;  Surgeon: Lazaro Arms, MD;  [facility]: [facility];  Service: Gynecology;  Laterality: N/A;   CHOLECYSTECTOMY N/A 04/17/2017   Procedure: LAPAROSCOPIC CHOLECYSTECTOMY;  Surgeon: Franky Macho, MD;  Location: [facility];  Service: General;  Laterality: N/A;   COLONOSCOPY WITH PROPOFOL N/A 08/29/2020   Procedure: COLONOSCOPY WITH PROPOFOL;  Surgeon: Dolores Frame, MD;  [facility];  Service: Gastroenterology;  Laterality: N/A;  11:15   COLPOSCOPY     ESOPHAGOGASTRODUODENOSCOPY (EGD) WITH  PROPOFOL N/A 04/14/2017   Procedure: ESOPHAGOGASTRODUODENOSCOPY (EGD) WITH PROPOFOL;  Surgeon: Malissa Hippo, MD;  [facility];  Service: Endoscopy;  Laterality: N/A;   ESOPHAGOGASTRODUODENOSCOPY (EGD) WITH PROPOFOL N/A 05/22/2020   Procedure: ESOPHAGOGASTRODUODENOSCOPY (EGD) WITH PROPOFOL;  Surgeon: Corbin Ade, MD;  [facility];  Service: Endoscopy;  Laterality: N/A;   ESOPHAGOGASTRODUODENOSCOPY (EGD) WITH PROPOFOL N/A 09/09/2020   Procedure: ESOPHAGOGASTRODUODENOSCOPY (EGD) WITH PROPOFOL;  Surgeon: Lanelle Bal, DO;  [facility];  Service: Endoscopy;  Laterality: N/A;   ESOPHAGOGASTRODUODENOSCOPY  (EGD) WITH PROPOFOL N/A 11/21/2020   Procedure: ESOPHAGOGASTRODUODENOSCOPY (EGD) WITH PROPOFOL;  Surgeon: Dolores Frame, MD;  [facility];  Service: Gastroenterology;  Laterality: N/A;  Am   ESOPHAGOGASTRODUODENOSCOPY (EGD) WITH PROPOFOL N/A 11/21/2021   Procedure: ESOPHAGOGASTRODUODENOSCOPY (EGD) WITH PROPOFOL;  Surgeon: Malissa Hippo, MD;  [facility];  Service: Endoscopy;  Laterality: N/A;  820   ESOPHAGOGASTRODUODENOSCOPY (EGD) WITH PROPOFOL N/A 03/06/2023   Procedure: ESOPHAGOGASTRODUODENOSCOPY (EGD) WITH PROPOFOL;  Surgeon: Dolores Frame, MD;  [facility];  Service: Gastroenterology;  Laterality: N/A;  11:45AM;ASA 2   HERNIA REPAIR Bilateral    inguinal   LASER ABLATION CONDOLAMATA N/A 12/02/2018   Procedure: LASER ABLATION CONDYLOMA ACCUMINATA LEFT AND RIGHT VULVA, PERINEUM AND PERIANAL (15 TOTAL);  Surgeon: Lazaro Arms, MD;  Location: [facility];  Service: Gynecology;  Laterality: N/A;   LUMBAR LAMINECTOMY/DECOMPRESSION MICRODISCECTOMY Right 12/03/2021   Procedure: MICRODISCECTOMY Lumbar four- five;  Surgeon: Tressie Stalker, MD;  Location: [facility];  Service: Neurosurgery;  Laterality: Right;   MOUTH SURGERY  09/2021   All top teeth and all wisdom teeth removed   MOUTH SURGERY  12/2021   POLYPECTOMY  08/29/2020   Procedure: POLYPECTOMY;  Surgeon: Dolores Frame, MD;  [facility];  Service: Gastroenterology;;   Patient Active Problem List   Diagnosis Date Noted   Screening examination for STD (sexually transmitted disease) 08/05/2023   Herpes 08/05/2023   Vaginal irritation 08/05/2023   Vaginal discharge 08/05/2023   Urinary incontinence 08/05/2023   Continuous urine leakage 07/15/2023   Essential tremor 05/01/2023   History of migraine headaches 05/01/2023   History of UTI 02/18/2023   Hematuria 02/18/2023   Trichomoniasis 02/04/2023   Substance abuse (HCC) 12/17/2022   Bartholin's gland cyst  12/17/2022   Dysmenorrhea 10/24/2022   Dyspareunia, female 10/24/2022   Pelvic pain 10/24/2022   Helicobacter positive gastritis 05/07/2022   Erosive gastropathy 05/07/2022   Boil 04/12/2022   Positive H. 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) 08/23/2020   Post laminectomy syndrome 08/23/2020   Frequent headaches 08/10/2020   Nexplanon in place 08/10/2020   Irregular intermenstrual bleeding 08/10/2020   Obesity (BMI 30-39.9) 05/20/2020   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   Panic attack 05/18/2020   Epigastric pain 05/18/2020   Agitated 05/18/2020   Gastroesophageal reflux disease 05/10/2020   Alternating constipation and diarrhea 05/10/2020   Bilateral wheezing 02/22/2020   Sleep disturbance 02/22/2020   Anxiety and depression 02/19/2019   Intractable nausea and vomiting 12/07/2018   Low back pain 12/07/2018  Chronic diarrhea 12/07/2018   Nausea & vomiting 12/07/2018   Condyloma 04/15/2018   Abnormal Pap smear of cervix 11/04/2017   Reflux esophagitis 04/17/2017   Tobacco abuse 04/17/2017   Gastro-esophageal reflux disease with esophagitis 04/17/2017   Peptic ulcer disease 04/13/2017   Erosive gastritis 04/11/2017   Leukocytosis 04/11/2017   Anxiety 04/11/2017    PCP: Billie Lade, MD   REFERRING PROVIDER: Tressie Stalker, MD  REFERRING DIAG: M54.2 (ICD-10-CM) - Cervicalgia M51.16 (ICD-10-CM) - Herniation of lumbar intervertebral disc with radiculopathy  Rationale for Evaluation and Treatment: Rehabilitation  THERAPY DIAG:  Other low  back pain  Neck pain  Weakness of left lower extremity  ONSET DATE: over 6 months ago  SUBJECTIVE:                                                                                                                                                                                           SUBJECTIVE STATEMENT: Pt reports back has gotten a little worse. Pt reports getting sick first with a cold then with a stomach bug. Pt reports having a busy chore day yesterday and had to sit down after about 4 hours of legs. Pt states low back pain is still keeping from going to sleep. Reports 6/10 pain this morning.    PERTINENT HISTORY:  History of two low back surgeries, one a couple of years ago, the other when pt was 16.   PAIN:  Are you having pain? Yes: NPRS scale: 8/10 Pain location: lower cervical spine, lumbar spine, legs ache occasionally Pain description: dull, occasionally sharp spurts (low back) and sharp (neck) Aggravating factors: working around the house, arm movements and lifting for the neck, lifting from floor and standing/sitting for long periods of time for the low back Relieving factors: heat, [place], laying down flat, keeping arms above head  PRECAUTIONS: None  RED FLAGS: None   WEIGHT BEARING RESTRICTIONS: No  FALLS:  Has patient fallen in last 6 months? Yes. Number of falls 1, just tripped  OCCUPATION: homemaker  PATIENT GOALS: decrease the pain in neck and low back, everyday tasks with decreased breaks.  NEXT MD VISIT: none, waiting to complete therapy  OBJECTIVE:  Note: Objective measures were completed at Evaluation unless otherwise noted.  PATIENT SURVEYS:  Modified Oswestry 20/50  NDI 15/50  COGNITION: Overall cognitive status: Within functional limits for tasks assessed     SENSATION: Hands go numb almost everyday    POSTURE: rounded shoulders, forward head, decreased lumbar lordosis, and increased thoracic kyphosis  PALPATION: Tenderness to  palpation of paraspinal musculature from C6-T4 and L1-L4. Spinous processes sensitive to touch as  well of the same spinal segments with decreased mobility. CERVICAL ROM:   AROM eval  Flexion 40  Extension 35  Right lateral flexion 45  Left lateral flexion 40, pain  Right rotation 64  Left rotation 44, pain   (Blank rows = not tested) LUMBAR ROM:  AROM eval  Flexion 50, pain  Extension 15, pain  Right lateral flexion 16, pain  Left lateral flexion 15, pain  Right rotation 75%, pain on left side  Left rotation 100%   (Blank rows = not tested)   LOWER EXTREMITY MMT:    MMT Right eval Left eval  Hip flexion 4- 4-  Hip extension 4- 4-  Hip abduction 4+ 4-  Hip adduction    Hip internal rotation    Hip external rotation    Knee flexion 4+ 4  Knee extension 4+ 4  Ankle dorsiflexion 5 5  Ankle plantarflexion    Ankle inversion    Ankle eversion     (Blank rows = not tested)  CERVICAL SPECIAL TESTS:  Spurling's Compression test, negative bilaterally for radicular symptoms, pt reports neck pain during compression of left cervical spine  FUNCTIONAL TESTS:  2 minute walk test: 400 feet   30 second chair stand test: 6.5 reps GAIT: Distance walked: 400 Assistive device utilized: None Level of assistance: Complete Independence Comments: Pt demonstrates abnormally decreased gait speed and demonstrates mild varying of stride lengths especially during making 90 degree turns during .  TREATMENT DATE:  12/22/2023   Therapeutic Exercise: -Side lying Thoracic rotations, 1 sets of 8 reps (pain increases towards end of set, left side not as bad) -Clam Shells 2 sets, 10 reps, pt cued for foot placement -Supine Bridges, 2 sets of 10 reps -Prone press ups to elbows, 30 seconds -Prone press ups to hands, 30 seconds (made a little worse) -Side steps with mini squat, second lap added RTB at ankles (pt requires demonstration and multiple Vcs for proper form  Neuromuscular  Re-education: -Supine PPT, 2 set of 10 reps, pt cued for core activation -Bent knee fallouts, 1 sets of 10 reps -Cat Cow, 10 reps 3 second holds, pt requires VC for proper form   11/28/2023  Evaluation/Assessment of neck and back Therapeutic Exercise: -Supine PPT 1 set of 8 reps -Supine LTR, 1 set of 10 reps (pt reports this movement is too painful even after being instructed to stay short of painful ROM -Seated Chin Tucks 1 set of 10 reps -Seated Scapular Retractions 1 set of 10 reps                                                                                                                                 PATIENT EDUCATION:  Education details: Pt was educated on findings of PT evaluation, prognosis, frequency of therapy visits and rationale, attendance policy, and HEP if given.   Person educated: Patient Education method: Explanation, Demonstration, Tactile cues, Verbal cues, and Handouts  Education comprehension: verbalized understanding, returned demonstration, verbal cues required, and tactile cues required  HOME EXERCISE PROGRAM: Access Code: [email or web address] URL: [email or web address] Date: 11/28/2023 Prepared by: Luz Lex  Exercises - Seated Cervical Retraction  - 1 x daily - 7 x weekly - 3 sets - 10 reps - Seated Scapular Retraction  - 1 x daily - 7 x weekly - 3 sets - 10 reps - Supine Posterior Pelvic Tilt  - 1 x daily - 7 x weekly - 3 sets - 10 reps  ASSESSMENT:  CLINICAL IMPRESSION: Patient continues to demonstrate signs of LBP, decreased LE strength and endurance. Patient also demonstrates increased back pain with side lying thoracic rotations to the right but not to the left. Patient able to initiate dynamic core stabilization exercise with side stepping with mini squat but requires multiple demonstrations and verbal cues for proper form and maintaining neutral spine. Patient would continue to benefit from skilled physical therapy for increased endurance  core musculature, increased LE strength, and decreased back pain for improved quality of life, improved independence with gait training and continued progress towards therapy goals.    OBJECTIVE IMPAIRMENTS: Abnormal gait, decreased activity tolerance, decreased endurance, decreased mobility, decreased ROM, decreased strength, and pain.   ACTIVITY LIMITATIONS: carrying, lifting, bending, sitting, standing, squatting, stairs, and transfers  PARTICIPATION LIMITATIONS: meal prep, cleaning, laundry, driving, community activity, and yard work  PERSONAL FACTORS: Age and 1-2 comorbidities: Anxiety, Chronic back pain  are also affecting patient's functional outcome.   REHAB POTENTIAL: Good  CLINICAL DECISION MAKING: Stable/uncomplicated  EVALUATION COMPLEXITY: Low   GOALS: Goals reviewed with patient? Yes  SHORT TERM GOALS: Target date: 12/19/23  Pt will be independent with HEP in order to demonstrate participation in Physical Therapy POC.  Baseline: Goal status: IN PROGRESS  2.  Pt will report 5/10 pain with mobility in order to demonstrate improved pain with ADLs.  Baseline:  Goal status: IN PROGRESS  LONG TERM GOALS: Target date: 01/09/24  Pt will improve LLE strength to at least 4+/5 in order to demonstrate improved functional strength to return to desired activities.  Baseline: See above Goal status: IN PROGRESS  2.  Pt will improve 2 MWT by 140 feet in order to demonstrate improved functional ambulatory capacity in community setting.  Baseline: 400 feet Goal status: INITIAL  3.  Pt will improve Modified Oswestry score to <5 in order to demonstrate improved pain with functional goals and outcomes. Baseline: 15/50 Goal status: INITIAL  4.  Pt will report 3/10 pain with mobility in order to demonstrate reduced pain with ADLs lasting greater than 30 minutes.  Baseline: 7/10 Goal status: IN PROGRESS   PLAN:  PT FREQUENCY: 2x/week  PT DURATION: 6 weeks  PLANNED  INTERVENTIONS: 97110-Therapeutic exercises, 97530- Therapeutic activity, 97112- Neuromuscular re-education, 97535- Self Care, [id number]- Manual therapy, [phone]- Gait training, Balance training, Stair training, Joint mobilization, Joint manipulation, Spinal manipulation, Spinal mobilization, Cryotherapy, and Moist heat.  PLAN FOR NEXT SESSION: Review HEP, progress core strengthening, progress dynamic core stabilization   Luz Lex, PT, DPT [facility] Office: [phone] 8:47 AM, 12/22/23

## 2023-12-24 ENCOUNTER — Other Ambulatory Visit: Payer: Self-pay | Admitting: Internal Medicine

## 2023-12-24 ENCOUNTER — Encounter (HOSPITAL_COMMUNITY): Payer: Self-pay

## 2023-12-24 ENCOUNTER — Ambulatory Visit (HOSPITAL_COMMUNITY): Attending: Neurosurgery

## 2023-12-24 DIAGNOSIS — M5459 Other low back pain: Secondary | ICD-10-CM | POA: Insufficient documentation

## 2023-12-24 DIAGNOSIS — R29898 Other symptoms and signs involving the musculoskeletal system: Secondary | ICD-10-CM | POA: Insufficient documentation

## 2023-12-24 DIAGNOSIS — M542 Cervicalgia: Secondary | ICD-10-CM | POA: Diagnosis present

## 2023-12-24 NOTE — Therapy (Signed)
 OUTPATIENT PHYSICAL THERAPY THORACOLUMBAR/CERVICAL EVALUATION   Patient Name: NYEEMAH JENNETTE MRN: [id number] DOB:10/11/1990, 33 y.o., female Today's Date: 12/24/2023  END OF SESSION:  PT End of Session - 12/24/23 1013     Visit Number 3    Number of Visits 13    Date for PT Re-Evaluation 01/09/24    Authorization Type [place] MEDICAID HE[employer] BLUE    Authorization Time Period seeking auth    Progress Note Due on Visit 10    PT Start Time 0932    PT Stop Time 1013    PT Time Calculation (min) 41 min    Activity Tolerance Patient tolerated treatment well;Patient limited by pain    Behavior During Therapy [facility] for tasks assessed/performed               Past Medical History:  Diagnosis Date   Anxiety    Bacterial vaginosis    Bradycardia 04/17/2017   Chest pain on breathing 02/22/2020   Chronic back pain    Depression    Erosive gastritis 04/11/2017   Gastric ulcer    Genital warts    GERD (gastroesophageal reflux disease)    High cholesterol    History of gestational hypertension 05/21/2018   Dx intrapartum   History of kidney stones    Intertrigo 11/16/2019   Intractable abdominal pain 04/11/2017   Intractable nausea and vomiting 12/07/2018   Intractable vomiting 05/20/2020   Irregular intermenstrual bleeding 02/19/2019   Mental disorder    Migraine without aura and without status migrainosus, not intractable 02/19/2019   Nausea 05/10/2020   Nausea & vomiting 12/07/2018   Nexplanon in place 02/19/2019   Obesity 04/17/2017   Panic disorder    Peptic ulcer disease 04/13/2017   Pinched nerve 09/24/2019   Postpartum hypertension 06/23/2018   requiring norvasc, resolved at pp visit   Sciatica    Scoliosis    Tremor of both hands    Trichomoniasis    Vaginal Pap smear, abnormal    Wheezing    Past Surgical History:  Procedure Laterality Date   BACK SURGERY     ruptured disc.   BACK SURGERY  11/2021   BIOPSY  04/14/2017   Procedure: BIOPSY;  Surgeon: Malissa Hippo, MD;  [facility];  Service: Endoscopy;;  gastric   BIOPSY  05/22/2020   Procedure: BIOPSY;  Surgeon: Corbin Ade, MD;  [facility];  Service: Endoscopy;;   BIOPSY  08/29/2020   Procedure: BIOPSY;  Surgeon: Dolores Frame, MD;  [facility];  Service: Gastroenterology;;   BIOPSY  09/09/2020   Procedure: BIOPSY;  Surgeon: Lanelle Bal, DO;  [facility];  Service: Endoscopy;;  duodenum;antral   BIOPSY  11/21/2021   Procedure: BIOPSY;  Surgeon: Malissa Hippo, MD;  [facility];  Service: Endoscopy;;   BIOPSY  03/06/2023   Procedure: BIOPSY;  Surgeon: Dolores Frame, MD;  [facility];  Service: Gastroenterology;;   CERVICAL ABLATION N/A 12/02/2018   Procedure: LASER ABLATION OF CERVIX;  Surgeon: Lazaro Arms, MD;  [facility]: [facility];  Service: Gynecology;  Laterality: N/A;   CHOLECYSTECTOMY N/A 04/17/2017   Procedure: LAPAROSCOPIC CHOLECYSTECTOMY;  Surgeon: Franky Macho, MD;  Location: [facility];  Service: General;  Laterality: N/A;   COLONOSCOPY WITH PROPOFOL N/A 08/29/2020   Procedure: COLONOSCOPY WITH PROPOFOL;  Surgeon: Dolores Frame, MD;  [facility];  Service: Gastroenterology;  Laterality: N/A;  11:15  COLPOSCOPY     ESOPHAGOGASTRODUODENOSCOPY (EGD) WITH PROPOFOL N/A 04/14/2017   Procedure: ESOPHAGOGASTRODUODENOSCOPY (EGD) WITH PROPOFOL;  Surgeon: Malissa Hippo, MD;  [facility];  Service: Endoscopy;  Laterality: N/A;   ESOPHAGOGASTRODUODENOSCOPY (EGD) WITH PROPOFOL N/A 05/22/2020   Procedure: ESOPHAGOGASTRODUODENOSCOPY (EGD) WITH PROPOFOL;  Surgeon: Corbin Ade, MD;  [facility];  Service: Endoscopy;  Laterality: N/A;   ESOPHAGOGASTRODUODENOSCOPY (EGD) WITH PROPOFOL N/A 09/09/2020   Procedure: ESOPHAGOGASTRODUODENOSCOPY (EGD) WITH PROPOFOL;  Surgeon: Lanelle Bal, DO;  [facility];  Service: Endoscopy;   Laterality: N/A;   ESOPHAGOGASTRODUODENOSCOPY (EGD) WITH PROPOFOL N/A 11/21/2020   Procedure: ESOPHAGOGASTRODUODENOSCOPY (EGD) WITH PROPOFOL;  Surgeon: Dolores Frame, MD;  [facility];  Service: Gastroenterology;  Laterality: N/A;  Am   ESOPHAGOGASTRODUODENOSCOPY (EGD) WITH PROPOFOL N/A 11/21/2021   Procedure: ESOPHAGOGASTRODUODENOSCOPY (EGD) WITH PROPOFOL;  Surgeon: Malissa Hippo, MD;  [facility];  Service: Endoscopy;  Laterality: N/A;  820   ESOPHAGOGASTRODUODENOSCOPY (EGD) WITH PROPOFOL N/A 03/06/2023   Procedure: ESOPHAGOGASTRODUODENOSCOPY (EGD) WITH PROPOFOL;  Surgeon: Dolores Frame, MD;  [facility];  Service: Gastroenterology;  Laterality: N/A;  11:45AM;ASA 2   HERNIA REPAIR Bilateral    inguinal   LASER ABLATION CONDOLAMATA N/A 12/02/2018   Procedure: LASER ABLATION CONDYLOMA ACCUMINATA LEFT AND RIGHT VULVA, PERINEUM AND PERIANAL (15 TOTAL);  Surgeon: Lazaro Arms, MD;  Location: [facility];  Service: Gynecology;  Laterality: N/A;   LUMBAR LAMINECTOMY/DECOMPRESSION MICRODISCECTOMY Right 12/03/2021   Procedure: MICRODISCECTOMY Lumbar four- five;  Surgeon: Tressie Stalker, MD;  [facility];  Service: Neurosurgery;  Laterality: Right;   MOUTH SURGERY  09/2021   All top teeth and all wisdom teeth removed   MOUTH SURGERY  12/2021   POLYPECTOMY  08/29/2020   Procedure: POLYPECTOMY;  Surgeon: Dolores Frame, MD;  [facility];  Service: Gastroenterology;;   Patient Active Problem List   Diagnosis Date Noted   Screening examination for STD (sexually transmitted disease) 08/05/2023   Herpes 08/05/2023   Vaginal irritation 08/05/2023   Vaginal discharge 08/05/2023   Urinary incontinence 08/05/2023   Continuous urine leakage 07/15/2023   Essential tremor 05/01/2023   History of migraine headaches 05/01/2023   History of UTI 02/18/2023   Hematuria 02/18/2023   Trichomoniasis 02/04/2023   Substance  abuse (HCC) 12/17/2022   Bartholin's gland cyst 12/17/2022   Dysmenorrhea 10/24/2022   Dyspareunia, female 10/24/2022   Pelvic pain 10/24/2022   Helicobacter positive gastritis 05/07/2022   Erosive gastropathy 05/07/2022   Boil 04/12/2022   Positive H. 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) 08/23/2020   Post laminectomy syndrome 08/23/2020   Frequent headaches 08/10/2020   Nexplanon in place 08/10/2020   Irregular intermenstrual bleeding 08/10/2020   Obesity (BMI 30-39.9) 05/20/2020   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   Panic attack 05/18/2020   Epigastric pain 05/18/2020   Agitated 05/18/2020   Gastroesophageal reflux disease 05/10/2020   Alternating constipation and diarrhea 05/10/2020   Bilateral wheezing 02/22/2020   Sleep disturbance 02/22/2020   Anxiety and depression 02/19/2019   Intractable nausea and  vomiting 12/07/2018   Low back pain 12/07/2018   Chronic diarrhea 12/07/2018   Nausea & vomiting 12/07/2018   Condyloma 04/15/2018   Abnormal Pap smear of cervix 11/04/2017   Reflux esophagitis 04/17/2017   Tobacco abuse 04/17/2017   Gastro-esophageal reflux disease with esophagitis 04/17/2017   Peptic ulcer disease 04/13/2017   Erosive gastritis 04/11/2017   Leukocytosis 04/11/2017   Anxiety 04/11/2017    PCP: Billie Lade, MD   REFERRING PROVIDER: Tressie Stalker, MD  REFERRING DIAG: M54.2 (ICD-10-CM) - Cervicalgia M51.16 (ICD-10-CM) - Herniation of lumbar intervertebral disc with radiculopathy  Rationale for Evaluation and  Treatment: Rehabilitation  THERAPY DIAG:  Other low back pain  Weakness of left lower extremity  Neck pain  ONSET DATE: over 6 months ago  SUBJECTIVE:                                                                                                                                                                                           SUBJECTIVE STATEMENT: Pt reports increased pain yesterday due to busy spring cleaning and chore day. Pt reports legs hurting more today and with increased activity. Patient reports pain rating of 6/10 upon presentation. Patient reports compliance with HEP, able to complete a couple a days a week. Pt thinks PT is helping a little bit.   PERTINENT HISTORY:  History of two low back surgeries, one a couple of years ago, the other when pt was 16.   PAIN:  Are you having pain? Yes: NPRS scale: 8/10 Pain location: lower cervical spine, lumbar spine, legs ache occasionally Pain description: dull, occasionally sharp spurts (low back) and sharp (neck) Aggravating factors: working around the house, arm movements and lifting for the neck, lifting from floor and standing/sitting for long periods of time for the low back Relieving factors: heat, [place], laying down flat, keeping arms above head  PRECAUTIONS: None  RED FLAGS: None   WEIGHT BEARING RESTRICTIONS: No  FALLS:  Has patient fallen in last 6 months? Yes. Number of falls 1, just tripped  OCCUPATION: homemaker  PATIENT GOALS: decrease the pain in neck and low back, everyday tasks with decreased breaks.  NEXT MD VISIT: none, waiting to complete therapy  OBJECTIVE:  Note: Objective measures were completed at Evaluation unless otherwise noted.  PATIENT SURVEYS:  Modified Oswestry 20/50  NDI 15/50  COGNITION: Overall cognitive status: Within functional limits for tasks assessed     SENSATION: Hands go numb almost everyday    POSTURE: rounded shoulders, forward head, decreased lumbar  lordosis, and increased thoracic kyphosis  PALPATION: Tenderness to palpation of paraspinal musculature from C6-T4 and L1-L4. Spinous  processes sensitive to touch as well of the same spinal segments with decreased mobility. CERVICAL ROM:   AROM eval  Flexion 40  Extension 35  Right lateral flexion 45  Left lateral flexion 40, pain  Right rotation 64  Left rotation 44, pain   (Blank rows = not tested) LUMBAR ROM:  AROM eval  Flexion 50, pain  Extension 15, pain  Right lateral flexion 16, pain  Left lateral flexion 15, pain  Right rotation 75%, pain on left side  Left rotation 100%   (Blank rows = not tested)   LOWER EXTREMITY MMT:    MMT Right eval Left eval  Hip flexion 4- 4-  Hip extension 4- 4-  Hip abduction 4+ 4-  Hip adduction    Hip internal rotation    Hip external rotation    Knee flexion 4+ 4  Knee extension 4+ 4  Ankle dorsiflexion 5 5  Ankle plantarflexion    Ankle inversion    Ankle eversion     (Blank rows = not tested)  CERVICAL SPECIAL TESTS:  Spurling's Compression test, negative bilaterally for radicular symptoms, pt reports neck pain during compression of left cervical spine  FUNCTIONAL TESTS:  2 minute walk test: 400 feet   30 second chair stand test: 6.5 reps GAIT: Distance walked: 400 Assistive device utilized: None Level of assistance: Complete Independence Comments: Pt demonstrates abnormally decreased gait speed and demonstrates mild varying of stride lengths especially during making 90 degree turns during .  TREATMENT DATE:  12/24/2023  Therapeutic Exercise: -DKTC on green exercise ball, 1 set of 20 reps -LTR on green exercise ball, 1 set of 20 reps bilaterally -Supine bridges 2 sets of 10 reps, 3 second holds, symptomatic, pt cued for max hip extension -Lateral stepping, w RTB 3 laps 20 feet per lap, second 2 with RTB around ankles, pt cued for upright posture -Monster walks RTB, 2 laps 20 feet per lap, multiple VC  required  Therapeutic Activity: -Squat to chair, 2 sets of 10 reps, pt cued for core activation -PPT 1 set of 10 reps, 3 second holds, pr cued to remain in pain free ROM, requires multiple VC for proper form  12/22/2023   Therapeutic Exercise: -Side lying Thoracic rotations, 1 sets of 8 reps (pain increases towards end of set, left side not as bad) -Clam Shells 2 sets, 10 reps, pt cued for foot placement -Supine Bridges, 2 sets of 10 reps -Prone press ups to elbows, 30 seconds -Prone bridge 15 seconds, pain upon descent -Side steps with mini squat, second lap added RTB at ankles (pt requires occasional VC but performance much better compared to last time, increased squatting ability noted)  Neuromuscular Re-education: -Supine PPT, 2 set of 10 reps, pt cued for core activation and normal breathing pattern    11/28/2023  Evaluation/Assessment of neck and back Therapeutic Exercise: -Supine PPT 1 set of 8 reps -Supine LTR, 1 set of 10 reps (pt reports this movement is too painful even after being instructed to stay short of painful ROM -Seated Chin Tucks 1 set of 10 reps -Seated Scapular Retractions 1 set of 10 reps  PATIENT EDUCATION:  Education details: Pt was educated on findings of PT evaluation, prognosis, frequency of therapy visits and rationale, attendance policy, and HEP if given.   Person educated: Patient Education method: Explanation, Demonstration, Tactile cues, Verbal cues, and Handouts Education comprehension: verbalized understanding, returned demonstration, verbal cues required, and tactile cues required  HOME EXERCISE PROGRAM: Access Code: [email or web address] URL: [email or web address] Date: 11/28/2023 Prepared by: Luz Lex  Exercises - Seated Cervical Retraction  - 1 x daily - 7 x weekly - 3 sets - 10 reps - Seated Scapular  Retraction  - 1 x daily - 7 x weekly - 3 sets - 10 reps - Supine Posterior Pelvic Tilt  - 1 x daily - 7 x weekly - 3 sets - 10 reps  ASSESSMENT:  CLINICAL IMPRESSION: Patient continues to demonstrate decreased LE strength, increased low back pain and decreased overall mobility. Patient does demonstrates increased abduction strength during clamshell exercise today with increased resistance. Patient was also able to complete modified plank for 2 reps with decreased pain compared to the full plank position. Patient able to progress dynamic balance and core activation exercises today with monster walks with need for multiple verbal cues for proper form. Patient would continue to benefit from skilled physical therapy for increased pain free lumbar spine ROM, increased LE strength, and improved mobility of LE for improved quality of life, improved independence with gait training and continued progress towards therapy goals.     OBJECTIVE IMPAIRMENTS: Abnormal gait, decreased activity tolerance, decreased endurance, decreased mobility, decreased ROM, decreased strength, and pain.   ACTIVITY LIMITATIONS: carrying, lifting, bending, sitting, standing, squatting, stairs, and transfers  PARTICIPATION LIMITATIONS: meal prep, cleaning, laundry, driving, community activity, and yard work  PERSONAL FACTORS: Age and 1-2 comorbidities: Anxiety, Chronic back pain  are also affecting patient's functional outcome.   REHAB POTENTIAL: Good  CLINICAL DECISION MAKING: Stable/uncomplicated  EVALUATION COMPLEXITY: Low   GOALS: Goals reviewed with patient? Yes  SHORT TERM GOALS: Target date: 12/19/23  Pt will be independent with HEP in order to demonstrate participation in Physical Therapy POC.  Baseline: Goal status: IN PROGRESS  2.  Pt will report 5/10 pain with mobility in order to demonstrate improved pain with ADLs.  Baseline:  Goal status: IN PROGRESS  LONG TERM GOALS: Target date: 01/09/24  Pt  will improve LLE strength to at least 4+/5 in order to demonstrate improved functional strength to return to desired activities.  Baseline: See above Goal status: IN PROGRESS  2.  Pt will improve 2 MWT by 140 feet in order to demonstrate improved functional ambulatory capacity in community setting.  Baseline: 400 feet Goal status: INITIAL  3.  Pt will improve Modified Oswestry score to <5 in order to demonstrate improved pain with functional goals and outcomes. Baseline: 15/50 Goal status: INITIAL  4.  Pt will report 3/10 pain with mobility in order to demonstrate reduced pain with ADLs lasting greater than 30 minutes.  Baseline: 7/10 Goal status: IN PROGRESS   PLAN:  PT FREQUENCY: 2x/week  PT DURATION: 6 weeks  PLANNED INTERVENTIONS: 97110-Therapeutic exercises, 97530- Therapeutic activity, 97112- Neuromuscular re-education, 97535- Self Care, [id number]- Manual therapy, [phone]- Gait training, Balance training, Stair training, Joint mobilization, Joint manipulation, Spinal manipulation, Spinal mobilization, Cryotherapy, and Moist heat.  PLAN FOR NEXT SESSION: Review HEP, progress core strengthening, progress plank to toleration   Luz Lex, PT, DPT [facility] Office: [phone] 10:18 AM, 12/24/23

## 2023-12-25 ENCOUNTER — Encounter: Payer: Self-pay | Admitting: Internal Medicine

## 2023-12-25 MED ORDER — CLONAZEPAM 1 MG PO TABS: 1.0000 mg | ORAL_TABLET | Freq: Four times a day (QID) | ORAL | 0 refills | Status: DC | PRN

## 2023-12-29 NOTE — Telephone Encounter (Signed)
 Called spoke mother will have her office to schedule / or mychart schedule

## 2023-12-30 ENCOUNTER — Encounter (HOSPITAL_COMMUNITY): Payer: Self-pay

## 2023-12-30 ENCOUNTER — Ambulatory Visit (HOSPITAL_COMMUNITY)

## 2023-12-30 DIAGNOSIS — R29898 Other symptoms and signs involving the musculoskeletal system: Secondary | ICD-10-CM

## 2023-12-30 DIAGNOSIS — M5459 Other low back pain: Secondary | ICD-10-CM

## 2023-12-30 NOTE — Therapy (Signed)
 OUTPATIENT PHYSICAL THERAPY THORACOLUMBAR/CERVICAL EVALUATION   Patient Name: Debbie Santana MRN: [id number] DOB:[birth year], 33 y.o., female Today's Date: 12/30/2023  END OF SESSION:  PT End of Session - 12/30/23 0838     Visit Number 4    Number of Visits 13    Date for PT Re-Evaluation 01/09/24    Authorization Type [place] [employer] [place]    Authorization Time Period seeking auth    Progress Note Due on Visit 10    PT Start Time 0845    PT Stop Time 0923    PT Time Calculation (min) 38 min    Activity Tolerance Patient tolerated treatment well;Patient limited by pain    Behavior During Therapy [facility] for tasks assessed/performed                Past Medical History:  Diagnosis Date   Anxiety    Bacterial vaginosis    Bradycardia 04/17/2017   Chest pain on breathing 02/22/2020   Chronic back pain    Depression    Erosive gastritis 04/11/2017   Gastric ulcer    Genital warts    GERD (gastroesophageal reflux disease)    High cholesterol    History of gestational hypertension 05/21/2018   Dx intrapartum   History of kidney stones    Intertrigo 11/16/2019   Intractable abdominal pain 04/11/2017   Intractable nausea and vomiting 12/07/2018   Intractable vomiting 05/20/2020   Irregular intermenstrual bleeding 02/19/2019   Mental disorder    Migraine without aura and without status migrainosus, not intractable 02/19/2019   Nausea 05/10/2020   Nausea & vomiting 12/07/2018   Nexplanon in place 02/19/2019   Obesity 04/17/2017   Panic disorder    Peptic ulcer disease 04/13/2017   Pinched nerve 09/24/2019   Postpartum hypertension 06/23/2018   requiring norvasc, resolved at pp visit   Sciatica    Scoliosis    Tremor of both hands    Trichomoniasis    Vaginal Pap smear, abnormal    Wheezing    Past Surgical History:  Procedure Laterality Date   BACK SURGERY     ruptured disc.   BACK SURGERY  11/2021   BIOPSY  04/14/2017   Procedure: BIOPSY;  Surgeon:  Malissa Hippo, MD;  [facility];  Service: Endoscopy;;  gastric   BIOPSY  05/22/2020   Procedure: BIOPSY;  Surgeon: Corbin Ade, MD;  [facility];  Service: Endoscopy;;   BIOPSY  08/29/2020   Procedure: BIOPSY;  Surgeon: Dolores Frame, MD;  [facility];  Service: Gastroenterology;;   BIOPSY  09/09/2020   Procedure: BIOPSY;  Surgeon: Lanelle Bal, DO;  [facility];  Service: Endoscopy;;  duodenum;antral   BIOPSY  11/21/2021   Procedure: BIOPSY;  Surgeon: Malissa Hippo, MD;  [facility];  Service: Endoscopy;;   BIOPSY  03/06/2023   Procedure: BIOPSY;  Surgeon: Dolores Frame, MD;  [facility];  Service: Gastroenterology;;   CERVICAL ABLATION N/A 12/02/2018   Procedure: LASER ABLATION OF CERVIX;  Surgeon: Lazaro Arms, MD;  [facility]: [facility];  Service: Gynecology;  Laterality: N/A;   CHOLECYSTECTOMY N/A 04/17/2017   Procedure: LAPAROSCOPIC CHOLECYSTECTOMY;  Surgeon: Franky Macho, MD;  [facility]: [facility];  Service: General;  Laterality: N/A;   COLONOSCOPY WITH PROPOFOL N/A 08/29/2020   Procedure: COLONOSCOPY WITH PROPOFOL;  Surgeon: Dolores Frame, MD;  [facility];  Service: Gastroenterology;  Laterality: N/A;  11:15  COLPOSCOPY     ESOPHAGOGASTRODUODENOSCOPY (EGD) WITH PROPOFOL N/A 04/14/2017   Procedure: ESOPHAGOGASTRODUODENOSCOPY (EGD) WITH PROPOFOL;  Surgeon: Malissa Hippo, MD;  [facility];  Service: Endoscopy;  Laterality: N/A;   ESOPHAGOGASTRODUODENOSCOPY (EGD) WITH PROPOFOL N/A 05/22/2020   Procedure: ESOPHAGOGASTRODUODENOSCOPY (EGD) WITH PROPOFOL;  Surgeon: Corbin Ade, MD;  [facility];  Service: Endoscopy;  Laterality: N/A;   ESOPHAGOGASTRODUODENOSCOPY (EGD) WITH PROPOFOL N/A 09/09/2020   Procedure: ESOPHAGOGASTRODUODENOSCOPY (EGD) WITH PROPOFOL;  Surgeon: Lanelle Bal, DO;  [facility];  Service: Endoscopy;   Laterality: N/A;   ESOPHAGOGASTRODUODENOSCOPY (EGD) WITH PROPOFOL N/A 11/21/2020   Procedure: ESOPHAGOGASTRODUODENOSCOPY (EGD) WITH PROPOFOL;  Surgeon: Dolores Frame, MD;  [facility];  Service: Gastroenterology;  Laterality: N/A;  Am   ESOPHAGOGASTRODUODENOSCOPY (EGD) WITH PROPOFOL N/A 11/21/2021   Procedure: ESOPHAGOGASTRODUODENOSCOPY (EGD) WITH PROPOFOL;  Surgeon: Malissa Hippo, MD;  [facility];  Service: Endoscopy;  Laterality: N/A;  820   ESOPHAGOGASTRODUODENOSCOPY (EGD) WITH PROPOFOL N/A 03/06/2023   Procedure: ESOPHAGOGASTRODUODENOSCOPY (EGD) WITH PROPOFOL;  Surgeon: Dolores Frame, MD;  [facility];  Service: Gastroenterology;  Laterality: N/A;  11:45AM;ASA 2   HERNIA REPAIR Bilateral    inguinal   LASER ABLATION CONDOLAMATA N/A 12/02/2018   Procedure: LASER ABLATION CONDYLOMA ACCUMINATA LEFT AND RIGHT VULVA, PERINEUM AND PERIANAL (15 TOTAL);  Surgeon: Lazaro Arms, MD;  [facility];  Service: Gynecology;  Laterality: N/A;   LUMBAR LAMINECTOMY/DECOMPRESSION MICRODISCECTOMY Right 12/03/2021   Procedure: MICRODISCECTOMY Lumbar four- five;  Surgeon: Tressie Stalker, MD;  Location: [facility];  Service: Neurosurgery;  Laterality: Right;   MOUTH SURGERY  09/2021   All top teeth and all wisdom teeth removed   MOUTH SURGERY  12/2021   POLYPECTOMY  08/29/2020   Procedure: POLYPECTOMY;  Surgeon: Dolores Frame, MD;  [facility];  Service: Gastroenterology;;   Patient Active Problem List   Diagnosis Date Noted   Screening examination for STD (sexually transmitted disease) 08/05/2023   Herpes 08/05/2023   Vaginal irritation 08/05/2023   Vaginal discharge 08/05/2023   Urinary incontinence 08/05/2023   Continuous urine leakage 07/15/2023   Essential tremor 05/01/2023   History of migraine headaches 05/01/2023   History of UTI 02/18/2023   Hematuria 02/18/2023   Trichomoniasis 02/04/2023   Substance  abuse (HCC) 12/17/2022   Bartholin's gland cyst 12/17/2022   Dysmenorrhea 10/24/2022   Dyspareunia, female 10/24/2022   Pelvic pain 10/24/2022   Helicobacter positive gastritis 05/07/2022   Erosive gastropathy 05/07/2022   Boil 04/12/2022   Positive H. 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) 08/23/2020   Post laminectomy syndrome 08/23/2020   Frequent headaches 08/10/2020   Nexplanon in place 08/10/2020   Irregular intermenstrual bleeding 08/10/2020   Obesity (BMI 30-39.9) 05/20/2020   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   Panic attack 05/18/2020   Epigastric pain 05/18/2020   Agitated 05/18/2020   Gastroesophageal reflux disease 05/10/2020   Alternating constipation and diarrhea 05/10/2020   Bilateral wheezing 02/22/2020   Sleep disturbance 02/22/2020   Anxiety and depression 02/19/2019   Intractable nausea and  vomiting 12/07/2018   Low back pain 12/07/2018   Chronic diarrhea 12/07/2018   Nausea & vomiting 12/07/2018   Condyloma 04/15/2018   Abnormal Pap smear of cervix 11/04/2017   Reflux esophagitis 04/17/2017   Tobacco abuse 04/17/2017   Gastro-esophageal reflux disease with esophagitis 04/17/2017   Peptic ulcer disease 04/13/2017   Erosive gastritis 04/11/2017   Leukocytosis 04/11/2017   Anxiety 04/11/2017    PCP: Billie Lade, MD   REFERRING PROVIDER: Tressie Stalker, MD  REFERRING DIAG: M54.2 (ICD-10-CM) - Cervicalgia M51.16 (ICD-10-CM) - Herniation of lumbar intervertebral disc with radiculopathy  Rationale for Evaluation and  Treatment: Rehabilitation  THERAPY DIAG:  Other low back pain  Weakness of left lower extremity  ONSET DATE: over 6 months ago  SUBJECTIVE:                                                                                                                                                                                           SUBJECTIVE STATEMENT: Patient reports pain rating of 8/10 upon presentation. Patient reports compliance with HEP, able to complete 2-3/7 days a week. Pt states she has been super busy and has been walking some trails on the weekend with her family.    PERTINENT HISTORY:  History of two low back surgeries, one a couple of years ago, the other when pt was 16.   PAIN:  Are you having pain? Yes: NPRS scale: 8/10 Pain location: lower cervical spine, lumbar spine, legs ache occasionally Pain description: dull, occasionally sharp spurts (low back) and sharp (neck) Aggravating factors: working around the house, arm movements and lifting for the neck, lifting from floor and standing/sitting for long periods of time for the low back Relieving factors: heat, [place], laying down flat, keeping arms above head  PRECAUTIONS: None  RED FLAGS: None   WEIGHT BEARING RESTRICTIONS: No  FALLS:  Has patient fallen in last 6 months? Yes. Number of falls 1, just tripped  OCCUPATION: homemaker  PATIENT GOALS: decrease the pain in neck and low back, everyday tasks with decreased breaks.  NEXT MD VISIT: none, waiting to complete therapy  OBJECTIVE:  Note: Objective measures were completed at Evaluation unless otherwise noted.  PATIENT SURVEYS:  Modified Oswestry 20/50  NDI 15/50  COGNITION: Overall cognitive status: Within functional limits for tasks assessed     SENSATION: Hands go numb almost everyday    POSTURE: rounded shoulders, forward head, decreased lumbar lordosis, and increased thoracic kyphosis  PALPATION: Tenderness to palpation of paraspinal  musculature from C6-T4 and L1-L4. Spinous processes sensitive to touch as well of the same spinal segments with decreased mobility. CERVICAL ROM:  AROM eval  Flexion 40  Extension 35  Right lateral flexion 45  Left lateral flexion 40, pain  Right rotation 64  Left rotation 44, pain   (Blank rows = not tested) LUMBAR ROM:  AROM eval  Flexion 50, pain  Extension 15, pain  Right lateral flexion 16, pain  Left lateral flexion 15, pain  Right rotation 75%, pain on left side  Left rotation 100%   (Blank rows = not tested)   LOWER EXTREMITY MMT:    MMT Right eval Left eval  Hip flexion 4- 4-  Hip extension 4- 4-  Hip abduction 4+ 4-  Hip adduction    Hip internal rotation    Hip external rotation    Knee flexion 4+ 4  Knee extension 4+ 4  Ankle dorsiflexion 5 5  Ankle plantarflexion    Ankle inversion    Ankle eversion     (Blank rows = not tested)  CERVICAL SPECIAL TESTS:  Spurling's Compression test, negative bilaterally for radicular symptoms, pt reports neck pain during compression of left cervical spine  FUNCTIONAL TESTS:  2 minute walk test: 400 feet   30 second chair stand test: 6.5 reps GAIT: Distance walked: 400 Assistive device utilized: None Level of assistance: Complete Independence Comments: Pt demonstrates abnormally decreased gait speed and demonstrates mild varying of stride lengths especially during making 90 degree turns during .  TREATMENT DATE:  12/30/2023  -MHP 5 minutes in supine at end of session Therapeutic Exercise: -DKTC on green exercise ball, 3 set of 10 reps -LTR, 2 set of 10 reps bilaterally, symptomatic to the right -Supine bridges on green exercise ball 2 sets of 10 reps, 3 second holds, pt cued for max hip extension -Fwd/Bwd stepping with RTB w RTB 1 lap 20 feet per lap -Lateral stepping, w RTB 1 lap 20 feet per lap, second 2 with RTB around ankles, pt cued for upright posture -Monster walks RTB, 1 laps 20 feet per lap,  multiple VC required -Green exercise ball core pushdowns on plynth table, forward/side to side, 5 second holds 10 reps, 2 sets each direction  Therapeutic Activity: -Sled pushes/pulls with 30lbs, cueing for core activation -Squat to chair, 2 sets of 10 reps, with trampoline ball toss, pt cued for core activation -PPT 1 set of 20 reps, 3 second holds, pr cued to remain in pain free ROM, requires multiple VC for proper form  12/24/2023  Therapeutic Exercise: -DKTC on green exercise ball, 1 set of 20 reps -LTR on green exercise ball, 1 set of 20 reps bilaterally -Supine bridges 2 sets of 10 reps, 3 second holds, symptomatic, pt cued for max hip extension -Lateral stepping, w RTB 3 laps 20 feet per lap, second 2 with RTB around ankles, pt cued for upright posture -Monster walks RTB, 2 laps 20 feet per lap, multiple VC required  Therapeutic Activity: -Squat to chair, 2 sets of 10 reps, pt cued for core activation -PPT 1 set of 10 reps, 3 second holds, pr cued to remain in pain free ROM, requires multiple VC for proper form  12/22/2023   Therapeutic Exercise: -Side lying Thoracic rotations, 1 sets of 8 reps (pain increases towards end of set, left side not as bad) -Clam Shells 2 sets, 10 reps, pt cued for foot placement -Supine Bridges, 2 sets of 10 reps -Prone press ups to elbows, 30 seconds -Prone bridge 15 seconds, pain upon descent -Side steps with mini squat, second lap added RTB at ankles (pt requires occasional  VC but performance much better compared to last time, increased squatting ability noted)  Neuromuscular Re-education: -Supine PPT, 2 set of 10 reps, pt cued for core activation and normal breathing pattern    11/28/2023  Evaluation/Assessment of neck and back Therapeutic Exercise: -Supine PPT 1 set of 8 reps -Supine LTR, 1 set of 10 reps (pt reports this movement is too painful even after being instructed to stay short of painful ROM -Seated Chin Tucks 1 set of 10  reps -Seated Scapular Retractions 1 set of 10 reps                                                                                                                                 PATIENT EDUCATION:  Education details: Pt was educated on findings of PT evaluation, prognosis, frequency of therapy visits and rationale, attendance policy, and HEP if given.   Person educated: Patient Education method: Explanation, Demonstration, Tactile cues, Verbal cues, and Handouts Education comprehension: verbalized understanding, returned demonstration, verbal cues required, and tactile cues required  HOME EXERCISE PROGRAM: Access Code: [email or web address] URL: [email or web address] Date: 11/28/2023 Prepared by: Luz Lex  Exercises - Seated Cervical Retraction  - 1 x daily - 7 x weekly - 3 sets - 10 reps - Seated Scapular Retraction  - 1 x daily - 7 x weekly - 3 sets - 10 reps - Supine Posterior Pelvic Tilt  - 1 x daily - 7 x weekly - 3 sets - 10 reps  ASSESSMENT:  CLINICAL IMPRESSION: Patient continues to demonstrate decreased core and LE strength, and mildly impaired balance. Patient does demonstrate ability to complete trampoline toss on SLS for most reps with some foot touch down compensations required. Patient able to initiate progress dynamic balance and core activation exercises today with good performance with ball toss and sled pushes/pulls. Pt requests finishing with MHP today due to back hurting. Patient would continue to benefit from skilled physical therapy for increased endurance with ambulation, increased LE strength, and improved balance for improved quality of life, improved independence with LBP management and continued progress towards therapy goals.      OBJECTIVE IMPAIRMENTS: Abnormal gait, decreased activity tolerance, decreased endurance, decreased mobility, decreased ROM, decreased strength, and pain.   ACTIVITY LIMITATIONS: carrying, lifting, bending, sitting,  standing, squatting, stairs, and transfers  PARTICIPATION LIMITATIONS: meal prep, cleaning, laundry, driving, community activity, and yard work  PERSONAL FACTORS: Age and 1-2 comorbidities: Anxiety, Chronic back pain  are also affecting patient's functional outcome.   REHAB POTENTIAL: Good  CLINICAL DECISION MAKING: Stable/uncomplicated  EVALUATION COMPLEXITY: Low   GOALS: Goals reviewed with patient? Yes  SHORT TERM GOALS: Target date: 12/19/23  Pt will be independent with HEP in order to demonstrate participation in Physical Therapy POC.  Baseline: Goal status: IN PROGRESS  2.  Pt will report 5/10 pain with mobility in order to demonstrate improved pain with ADLs.  Baseline:  Goal status: IN PROGRESS  LONG TERM  GOALS: Target date: 01/09/24  Pt will improve LLE strength to at least 4+/5 in order to demonstrate improved functional strength to return to desired activities.  Baseline: See above Goal status: IN PROGRESS  2.  Pt will improve 2 MWT by 140 feet in order to demonstrate improved functional ambulatory capacity in community setting.  Baseline: 400 feet Goal status: INITIAL  3.  Pt will improve Modified Oswestry score to <5 in order to demonstrate improved pain with functional goals and outcomes. Baseline: 15/50 Goal status: INITIAL  4.  Pt will report 3/10 pain with mobility in order to demonstrate reduced pain with ADLs lasting greater than 30 minutes.  Baseline: 7/10 Goal status: IN PROGRESS   PLAN:  PT FREQUENCY: 2x/week  PT DURATION: 6 weeks  PLANNED INTERVENTIONS: 97110-Therapeutic exercises, 97530- Therapeutic activity, 97112- Neuromuscular re-education, 97535- Self Care, 16109- Manual therapy, [phone]- Gait training, Balance training, Stair training, Joint mobilization, Joint manipulation, Spinal manipulation, Spinal mobilization, Cryotherapy, and Moist heat.  PLAN FOR NEXT SESSION: Review HEP, progress core strengthening, progress plank to  toleration   Luz Lex, PT, DPT [facility] Office: [phone] 9:25 AM, 12/30/23

## 2024-01-01 ENCOUNTER — Encounter (HOSPITAL_COMMUNITY)

## 2024-01-02 ENCOUNTER — Ambulatory Visit: Payer: Self-pay

## 2024-01-02 NOTE — Telephone Encounter (Signed)
 Copied from CRM [id number]. Topic: Clinical - Red Word Triage >> Jan 02, 2024  4:43 PM Turkey B wrote: [facility] that prompted transfer to Nurse Triage: pt has back pain   Chief Complaint: Back pain Symptoms: Lower back pain radiating to bilateral legs, intermittent loss of bowel control (not new), painful urination  Frequency: Constant  Pertinent Negatives: Patient denies  Disposition: [] ED /[] Urgent Care (no appt availability in office) / [x] Appointment(In office/virtual)/ []  [facility]/ [] Home Care/ [] Refused Recommended Disposition /[] [place] [facility]/ []  Follow-up with PCP Additional Notes: Patient reports a history of chronic back pain that became exacerbated a few days ago. She states her current pain is constant and rates it as 8/10. Patient reports her pain is mostly in her left lower back and radiates to her bilateral legs. She states that she has also had some loss of bowel control and that it is something that has happened in the past. She states she is also having some pain with urination. Appointment made for the patient with an available provider for evaluation of her symptoms.     Reason for Disposition  [1] MODERATE back pain (e.g., interferes with normal activities) AND [2] present > 3 days  Answer Assessment - Initial Assessment Questions 1. ONSET: "When did the pain begin?"       A few days  2. LOCATION: "Where does it hurt?" (upper, mid or lower back)     Lower back, left sided  3. SEVERITY: "How bad is the pain?"  (e.g., Scale 1-10; mild, moderate, or severe)   - MILD (1-3): Doesn't interfere with normal activities.    - MODERATE (4-7): Interferes with normal activities or awakens from sleep.    - SEVERE (8-10): Excruciating pain, unable to do any normal activities.      8/10 4. PATTERN: "Is the pain constant?" (e.g., yes, no; constant, intermittent)      Constant  5. RADIATION: "Does the pain shoot into your legs or somewhere else?"     Radiates  to bilateral legs 6. CAUSE:  "What do you think is causing the back pain?"      Ongoing problem  7. BACK OVERUSE:  "Any recent lifting of heavy objects, strenuous work or exercise?"     On her feet a lot recently, pushing weights at therapy  8. MEDICINES: "What have you taken so far for the pain?" (e.g., nothing, acetaminophen, NSAIDS)     Baclofen without relief  9. NEUROLOGIC SYMPTOMS: "Do you have any weakness, numbness, or problems with bowel/bladder control?"     Loss of bowel control intermittently  10. OTHER SYMPTOMS: "Do you have any other symptoms?" (e.g., fever, abdomen pain, burning with urination, blood in urine)       Burning with urination  11. PREGNANCY: "Is there any chance you are pregnant?" "When was your last menstrual period?"       No  Protocols used: Back Pain-A-AH

## 2024-01-05 ENCOUNTER — Ambulatory Visit (HOSPITAL_COMMUNITY): Admitting: Physical Therapy

## 2024-01-05 ENCOUNTER — Ambulatory Visit: Payer: Self-pay

## 2024-01-05 VITALS — BP 122/79 | HR 103 | Ht 68.0 in | Wt 177.1 lb

## 2024-01-05 DIAGNOSIS — R29898 Other symptoms and signs involving the musculoskeletal system: Secondary | ICD-10-CM

## 2024-01-05 DIAGNOSIS — M5459 Other low back pain: Secondary | ICD-10-CM

## 2024-01-05 DIAGNOSIS — G8929 Other chronic pain: Secondary | ICD-10-CM

## 2024-01-05 DIAGNOSIS — M5416 Radiculopathy, lumbar region: Secondary | ICD-10-CM | POA: Diagnosis not present

## 2024-01-05 DIAGNOSIS — G894 Chronic pain syndrome: Secondary | ICD-10-CM | POA: Diagnosis not present

## 2024-01-05 MED ORDER — METHOCARBAMOL 750 MG PO TABS: 750.0000 mg | ORAL_TABLET | Freq: Three times a day (TID) | ORAL | 1 refills | Status: DC | PRN

## 2024-01-05 MED ORDER — GABAPENTIN 300 MG PO CAPS: 300.0000 mg | ORAL_CAPSULE | Freq: Two times a day (BID) | ORAL | 1 refills | Status: DC

## 2024-01-05 NOTE — Telephone Encounter (Signed)
 Noted patient seeing Rice Chamorro today 01/05/2024

## 2024-01-05 NOTE — Progress Notes (Unsigned)
   Acute Office Visit  Subjective:     Patient ID: Debbie Santana, female    DOB: 1991-01-31, 33 y.o.   MRN: [id number]  Chief Complaint  Patient presents with   Back Pain    Pt states she had lower back pain and also pain cervical spine near her neck is hurting she is currently in therapy for both of them right now and she needs to have surgery (Spinal Fusion) but has her in therapy for it     Dysuria    Pt also has pain when urinating     HPI Patient is in today for back pain   ROS      Objective:    BP 122/79   Pulse (!) 103   Ht 5\' 8"  (1.727 m)   Wt 177 lb 1.3 oz (80.3 kg)   SpO2 94%   BMI 26.92 kg/m  BP Readings from Last 3 Encounters:  01/05/24 122/79  12/12/23 105/73  11/03/23 132/74      Physical Exam Vitals and nursing note reviewed.  Constitutional:      Appearance: Normal appearance.  Eyes:     Extraocular Movements: Extraocular movements intact.     Pupils: Pupils are equal, round, and reactive to light.  Cardiovascular:     Rate and Rhythm: Normal rate and regular rhythm.  Pulmonary:     Effort: Pulmonary effort is normal.     Breath sounds: Normal breath sounds.  Musculoskeletal:     Cervical back: Normal.     Thoracic back: Normal.     Lumbar back: Spasms and tenderness present. Decreased range of motion.  Neurological:     Mental Status: She is alert and oriented to person, place, and time.  Psychiatric:        Mood and Affect: Mood normal.        Thought Content: Thought content normal.     No results found for any visits on 01/05/24.      Assessment & Plan:   Problem List Items Addressed This Visit       Other   Chronic radicular lumbar pain - Primary   She has an upcoming appointment with new pain management on 5/19, Dr. Shearon Stalls.  Will refill gabapentin at this time until she can get in with pain management.  She has taken this in the past for her back pain.  Robaxin also added for back pain.      Relevant Medications    gabapentin (NEURONTIN) 300 MG capsule   methocarbamol (ROBAXIN-750) 750 MG tablet   Chronic pain syndrome   Add gabapentin and Robaxin until she can establish with new pain management.      Relevant Medications   gabapentin (NEURONTIN) 300 MG capsule   methocarbamol (ROBAXIN-750) 750 MG tablet    Meds ordered this encounter  Medications   gabapentin (NEURONTIN) 300 MG capsule    Sig: Take 1 capsule (300 mg total) by mouth 2 (two) times daily.    Dispense:  60 capsule    Refill:  1   methocarbamol (ROBAXIN-750) 750 MG tablet    Sig: Take 1 tablet (750 mg total) by mouth every 8 (eight) hours as needed for muscle spasms.    Dispense:  90 tablet    Refill:  1    No follow-ups on file.  Darral Dash, FNP

## 2024-01-05 NOTE — Therapy (Signed)
 OUTPATIENT PHYSICAL THERAPY THORACOLUMBAR/CERVICAL TREATMENT   Patient Name: Debbie Santana MRN: [id number] DOB:06-Jun-1991, 33 y.o., female Today's Date: 01/05/2024  END OF SESSION:  PT End of Session - 01/05/24 0856     Visit Number 5    Number of Visits 13    Date for PT Re-Evaluation 01/09/24    Authorization Type [place] MEDICAID HEALTHY BLUE    Authorization Time Period 6 visits approved 11/28/23-01/26/24    Authorization - Visit Number 4    Authorization - Number of Visits 6    Progress Note Due on Visit 10    PT Start Time 0850    PT Stop Time 0930    PT Time Calculation (min) 40 min    Activity Tolerance Patient tolerated treatment well;Patient limited by pain    Behavior During Therapy [facility] for tasks assessed/performed                 Past Medical History:  Diagnosis Date   Anxiety    Bacterial vaginosis    Bradycardia 04/17/2017   Chest pain on breathing 02/22/2020   Chronic back pain    Depression    Erosive gastritis 04/11/2017   Gastric ulcer    Genital warts    GERD (gastroesophageal reflux disease)    High cholesterol    History of gestational hypertension 05/21/2018   Dx intrapartum   History of kidney stones    Intertrigo 11/16/2019   Intractable abdominal pain 04/11/2017   Intractable nausea and vomiting 12/07/2018   Intractable vomiting 05/20/2020   Irregular intermenstrual bleeding 02/19/2019   Mental disorder    Migraine without aura and without status migrainosus, not intractable 02/19/2019   Nausea 05/10/2020   Nausea & vomiting 12/07/2018   Nexplanon in place 02/19/2019   Obesity 04/17/2017   Panic disorder    Peptic ulcer disease 04/13/2017   Pinched nerve 09/24/2019   Postpartum hypertension 06/23/2018   requiring norvasc, resolved at pp visit   Sciatica    Scoliosis    Tremor of both hands    Trichomoniasis    Vaginal Pap smear, abnormal    Wheezing    Past Surgical History:  Procedure Laterality Date   BACK SURGERY      ruptured disc.   BACK SURGERY  11/2021   BIOPSY  04/14/2017   Procedure: BIOPSY;  Surgeon: Ruby Corporal, MD;  [facility];  Service: Endoscopy;;  gastric   BIOPSY  05/22/2020   Procedure: BIOPSY;  Surgeon: Suzette Espy, MD;  [facility];  Service: Endoscopy;;   BIOPSY  08/29/2020   Procedure: BIOPSY;  Surgeon: Urban Garden, MD;  [facility];  Service: Gastroenterology;;   BIOPSY  09/09/2020   Procedure: BIOPSY;  Surgeon: Vinetta Greening, DO;  [facility];  Service: Endoscopy;;  duodenum;antral   BIOPSY  11/21/2021   Procedure: BIOPSY;  Surgeon: Ruby Corporal, MD;  [facility];  Service: Endoscopy;;   BIOPSY  03/06/2023   Procedure: BIOPSY;  Surgeon: Urban Garden, MD;  [facility];  Service: Gastroenterology;;   CERVICAL ABLATION N/A 12/02/2018   Procedure: LASER ABLATION OF CERVIX;  Surgeon: Wendelyn Halter, MD;  [facility]: [facility];  Service: Gynecology;  Laterality: N/A;   CHOLECYSTECTOMY N/A 04/17/2017   Procedure: LAPAROSCOPIC CHOLECYSTECTOMY;  Surgeon: Alanda Allegra, MD;  [facility]: [facility];  Service: General;  Laterality: N/A;   COLONOSCOPY WITH PROPOFOL N/A 08/29/2020   Procedure: COLONOSCOPY WITH  PROPOFOL;  Surgeon: Umberto Ganong, Bearl Limes, MD;  [facility];  Service: Gastroenterology;  Laterality: N/A;  11:15   COLPOSCOPY     ESOPHAGOGASTRODUODENOSCOPY (EGD) WITH PROPOFOL N/A 04/14/2017   Procedure: ESOPHAGOGASTRODUODENOSCOPY (EGD) WITH PROPOFOL;  Surgeon: Ruby Corporal, MD;  [facility];  Service: Endoscopy;  Laterality: N/A;   ESOPHAGOGASTRODUODENOSCOPY (EGD) WITH PROPOFOL N/A 05/22/2020   Procedure: ESOPHAGOGASTRODUODENOSCOPY (EGD) WITH PROPOFOL;  Surgeon: Suzette Espy, MD;  [facility];  Service: Endoscopy;  Laterality: N/A;   ESOPHAGOGASTRODUODENOSCOPY (EGD) WITH PROPOFOL N/A 09/09/2020   Procedure: ESOPHAGOGASTRODUODENOSCOPY (EGD)  WITH PROPOFOL;  Surgeon: Vinetta Greening, DO;  [facility];  Service: Endoscopy;  Laterality: N/A;   ESOPHAGOGASTRODUODENOSCOPY (EGD) WITH PROPOFOL N/A 11/21/2020   Procedure: ESOPHAGOGASTRODUODENOSCOPY (EGD) WITH PROPOFOL;  Surgeon: Urban Garden, MD;  [facility];  Service: Gastroenterology;  Laterality: N/A;  Am   ESOPHAGOGASTRODUODENOSCOPY (EGD) WITH PROPOFOL N/A 11/21/2021   Procedure: ESOPHAGOGASTRODUODENOSCOPY (EGD) WITH PROPOFOL;  Surgeon: Ruby Corporal, MD;  [facility];  Service: Endoscopy;  Laterality: N/A;  820   ESOPHAGOGASTRODUODENOSCOPY (EGD) WITH PROPOFOL N/A 03/06/2023   Procedure: ESOPHAGOGASTRODUODENOSCOPY (EGD) WITH PROPOFOL;  Surgeon: Urban Garden, MD;  [facility];  Service: Gastroenterology;  Laterality: N/A;  11:45AM;ASA 2   HERNIA REPAIR Bilateral    inguinal   LASER ABLATION CONDOLAMATA N/A 12/02/2018   Procedure: LASER ABLATION CONDYLOMA ACCUMINATA LEFT AND RIGHT VULVA, PERINEUM AND PERIANAL (15 TOTAL);  Surgeon: Wendelyn Halter, MD;  Location: [facility];  Service: Gynecology;  Laterality: N/A;   LUMBAR LAMINECTOMY/DECOMPRESSION MICRODISCECTOMY Right 12/03/2021   Procedure: MICRODISCECTOMY Lumbar four- five;  Surgeon: Garry Kansas, MD;  [facility];  Service: Neurosurgery;  Laterality: Right;   MOUTH SURGERY  09/2021   All top teeth and all wisdom teeth removed   MOUTH SURGERY  12/2021   POLYPECTOMY  08/29/2020   Procedure: POLYPECTOMY;  Surgeon: Urban Garden, MD;  [facility];  Service: Gastroenterology;;   Patient Active Problem List   Diagnosis Date Noted   Screening examination for STD (sexually transmitted disease) 08/05/2023   Herpes 08/05/2023   Vaginal irritation 08/05/2023   Vaginal discharge 08/05/2023   Urinary incontinence 08/05/2023   Continuous urine leakage 07/15/2023   Essential tremor 05/01/2023   History of migraine headaches 05/01/2023    History of UTI 02/18/2023   Hematuria 02/18/2023   Trichomoniasis 02/04/2023   Substance abuse (HCC) 12/17/2022   Bartholin's gland cyst 12/17/2022   Dysmenorrhea 10/24/2022   Dyspareunia, female 10/24/2022   Pelvic pain 10/24/2022   Helicobacter positive gastritis 05/07/2022   Erosive gastropathy 05/07/2022   Boil 04/12/2022   Positive H. 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) 08/23/2020   Post laminectomy syndrome 08/23/2020   Frequent headaches 08/10/2020   Nexplanon in place 08/10/2020   Irregular intermenstrual bleeding 08/10/2020   Obesity (BMI 30-39.9) 05/20/2020   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   Panic attack 05/18/2020   Epigastric pain 05/18/2020   Agitated 05/18/2020   Gastroesophageal reflux disease 05/10/2020   Alternating constipation and diarrhea  05/10/2020   Bilateral wheezing 02/22/2020   Sleep disturbance 02/22/2020   Anxiety and depression 02/19/2019   Intractable nausea and vomiting 12/07/2018   Low back pain 12/07/2018   Chronic diarrhea 12/07/2018   Nausea & vomiting 12/07/2018   Condyloma 04/15/2018   Abnormal Pap smear of cervix 11/04/2017   Reflux esophagitis 04/17/2017   Tobacco abuse 04/17/2017   Gastro-esophageal reflux disease with esophagitis 04/17/2017   Peptic ulcer disease 04/13/2017   Erosive gastritis 04/11/2017   Leukocytosis 04/11/2017   Anxiety 04/11/2017    PCP: Tobi Fortes, MD   REFERRING PROVIDER: Garry Kansas, MD  REFERRING DIAG: M54.2 (ICD-10-CM) - Cervicalgia M51.16 (ICD-10-CM) -  Herniation of lumbar intervertebral disc with radiculopathy  Rationale for Evaluation and Treatment: Rehabilitation  THERAPY DIAG:  Other low back pain  Weakness of left lower extremity  ONSET DATE: over 6 months ago  SUBJECTIVE:                                                                                                                                                                                           SUBJECTIVE STATEMENT: Patient states it has gotten worse instead of better since coming to therapy.  States the pushing/pull activity really flaired her up..  States she's walking every weekend approx 1 mile but admits to not doing the HEP.  Pain remains 8/10 and is mid lower back radiating into the left side.  States she called MD and returns to MD this afternoon.   PERTINENT HISTORY:  History of two low back surgeries, one a couple of years ago, the other when pt was 16.   PAIN:  Are you having pain? Yes: NPRS scale: 8/10 Pain location: lower cervical spine, lumbar spine, legs ache occasionally Pain description: dull, occasionally sharp spurts (low back) and sharp (neck) Aggravating factors: working around the house, arm movements and lifting for the neck, lifting from floor and standing/sitting for long periods of time for the low back Relieving factors: heat, [place], laying down flat, keeping arms above head  PRECAUTIONS: None  RED FLAGS: None   WEIGHT BEARING RESTRICTIONS: No  FALLS:  Has patient fallen in last 6 months? Yes. Number of falls 1, just tripped  OCCUPATION: homemaker  PATIENT GOALS: decrease the pain in neck and low back, everyday tasks with decreased breaks.  NEXT MD VISIT: none, waiting to complete therapy  OBJECTIVE:  Note: Objective measures were completed at Evaluation unless otherwise noted.  PATIENT SURVEYS:  Modified Oswestry 20/50  NDI 15/50  COGNITION: Overall cognitive status: Within functional limits for tasks  assessed     SENSATION: Hands go numb almost  everyday    POSTURE: rounded shoulders, forward head, decreased lumbar lordosis, and increased thoracic kyphosis  PALPATION: Tenderness to palpation of paraspinal musculature from C6-T4 and L1-L4. Spinous processes sensitive to touch as well of the same spinal segments with decreased mobility. CERVICAL ROM:   AROM eval  Flexion 40  Extension 35  Right lateral flexion 45  Left lateral flexion 40, pain  Right rotation 64  Left rotation 44, pain   (Blank rows = not tested) LUMBAR ROM:  AROM eval  Flexion 50, pain  Extension 15, pain  Right lateral flexion 16, pain  Left lateral flexion 15, pain  Right rotation 75%, pain on left side  Left rotation 100%   (Blank rows = not tested)   LOWER EXTREMITY MMT:    MMT Right eval Left eval  Hip flexion 4- 4-  Hip extension 4- 4-  Hip abduction 4+ 4-  Hip adduction    Hip internal rotation    Hip external rotation    Knee flexion 4+ 4  Knee extension 4+ 4  Ankle dorsiflexion 5 5  Ankle plantarflexion    Ankle inversion    Ankle eversion     (Blank rows = not tested)  CERVICAL SPECIAL TESTS:  Spurling's Compression test, negative bilaterally for radicular symptoms, pt reports neck pain during compression of left cervical spine  FUNCTIONAL TESTS:  2 minute walk test: 400 feet   30 second chair stand test: 6.5 reps GAIT: Distance walked: 400 Assistive device utilized: None Level of assistance: Complete Independence Comments: Pt demonstrates abnormally decreased gait speed and demonstrates mild varying of stride lengths especially during making 90 degree turns during .  TREATMENT DATE:  01/05/24 Supine:  LTR 2X10 bilaterally 5" holds each direction Bridge 2X10X5" hlds Bridge on [employer] physioball 2X10 5" holds SLR 2X10 each LE Rectus abd isometric with green physioball 2 sets 10X5" Seated:  STS no UE 2X10 Long sitting hamstring stretch 3X30" each Piriformis stretch  3X30" each  12/30/2023  -MHP 5 minutes in supine at end of session Therapeutic Exercise: -DKTC on green exercise ball, 3 set of 10 reps -LTR, 2 set of 10 reps bilaterally, symptomatic to the right -Supine bridges on green exercise ball 2 sets of 10 reps, 3 second holds, pt cued for max hip extension -Fwd/Bwd stepping with RTB w RTB 1 lap 20 feet per lap -Lateral stepping, w RTB 1 lap 20 feet per lap, second 2 with RTB around ankles, pt cued for upright posture -Monster walks RTB, 1 laps 20 feet per lap, multiple VC required -Green exercise ball core pushdowns on plynth table, forward/side to side, 5 second holds 10 reps, 2 sets each direction  Therapeutic Activity: -Sled pushes/pulls with 30lbs, cueing for core activation -Squat to chair, 2 sets of 10 reps, with trampoline ball toss, pt cued for core activation -PPT 1 set of 20 reps, 3 second holds, pr cued to remain in pain free ROM, requires multiple VC for proper form  12/24/2023  Therapeutic Exercise: -DKTC on green exercise ball, 1 set of 20 reps -LTR on green exercise ball, 1 set of 20 reps bilaterally -Supine bridges 2 sets of 10 reps, 3 second holds, symptomatic, pt cued for max hip extension -Lateral stepping, w RTB 3 laps 20 feet per lap, second 2 with RTB around ankles, pt cued for upright posture -Monster walks RTB, 2 laps 20 feet per lap, multiple VC required  Therapeutic Activity: -Squat to chair, 2 sets of 10 reps, pt cued for  core activation -PPT 1 set of 10 reps, 3 second holds, pr cued to remain in pain free ROM, requires multiple VC for proper form  12/22/2023   Therapeutic Exercise: -Side lying Thoracic rotations, 1 sets of 8 reps (pain increases towards end of set, left side not as bad) -Clam Shells 2 sets, 10 reps, pt cued for foot placement -Supine Bridges, 2 sets of 10 reps -Prone press ups to elbows, 30 seconds -Prone bridge 15 seconds, pain upon descent -Side steps with mini squat, second lap added RTB at  ankles (pt requires occasional VC but performance much better compared to last time, increased squatting ability noted)  Neuromuscular Re-education: -Supine PPT, 2 set of 10 reps, pt cued for core activation and normal breathing pattern    11/28/2023  Evaluation/Assessment of neck and back Therapeutic Exercise: -Supine PPT 1 set of 8 reps -Supine LTR, 1 set of 10 reps (pt reports this movement is too painful even after being instructed to stay short of painful ROM -Seated Chin Tucks 1 set of 10 reps -Seated Scapular Retractions 1 set of 10 reps                                                                                                                                 PATIENT EDUCATION:  Education details: Pt was educated on findings of PT evaluation, prognosis, frequency of therapy visits and rationale, attendance policy, and HEP if given.   Person educated: Patient Education method: Explanation, Demonstration, Tactile cues, Verbal cues, and Handouts Education comprehension: verbalized understanding, returned demonstration, verbal cues required, and tactile cues required  HOME EXERCISE PROGRAM: Access Code: [email or web address] URL: [email or web address]  Date: 11/28/2023 Prepared by: Armond Bertin Exercises - Seated Cervical Retraction  - 1 x daily - 7 x weekly - 3 sets - 10 reps - Seated Scapular Retraction  - 1 x daily - 7 x weekly - 3 sets - 10 reps - Supine Posterior Pelvic Tilt  - 1 x daily - 7 x weekly - 3 sets - 10 reps  Date: 01/05/2024 Prepared by: Vickii Grand Exercises - Supine Transversus Abdominis Bracing - Hands on Stomach  - 2 x daily - 7 x weekly - 2 sets - 10 reps - 5 sec hold - Beginner Bridge  - 2 x daily - 7 x weekly - 2 sets - 10 reps - Small Range Straight Leg Raise  - 2 x daily - 7 x weekly - 2 sets - 10 reps - Seated Table Hamstring Stretch  - 2 x daily - 7 x weekly - 3 sets - 3 reps - 30 sec hold - Seated Piriformis Stretch with Trunk Bend  - 2 x  daily - 7 x weekly - 2 sets - 3 reps - 30 sec hold - Sit to Stand  - 2 x daily - 7 x weekly - 2 sets - 10 reps   ASSESSMENT:  CLINICAL IMPRESSION: Educated pt on  importance of completing HEP in order for mm to become stronger and to build exercise/activity tolerance.  Pt becomes sore and quits doing the exercises rather than being compliant.  Pt was only given 3 exercises for HEP that were mainly focused on cervical mm.  Updated HEP to include added exercises today targeting core and LE strength/stretching.  Pt is tight in her hamstring mm as well.  Instructed this stretch in long sitting.  Pt did not report any increased symptoms at end of session today.  Overall tolerated well. Patient will continue to benefit from skilled physical therapy for increased endurance with ambulation, increased LE strength, and improved balance for improved quality of life, improved independence with LBP management and continued progress towards therapy goals.    OBJECTIVE IMPAIRMENTS: Abnormal gait, decreased activity tolerance, decreased endurance, decreased mobility, decreased ROM, decreased strength, and pain.   ACTIVITY LIMITATIONS: carrying, lifting, bending, sitting, standing, squatting, stairs, and transfers  PARTICIPATION LIMITATIONS: meal prep, cleaning, laundry, driving, community activity, and yard work  PERSONAL FACTORS: Age and 1-2 comorbidities: Anxiety, Chronic back pain  are also affecting patient's functional outcome.   REHAB POTENTIAL: Good  CLINICAL DECISION MAKING: Stable/uncomplicated  EVALUATION COMPLEXITY: Low   GOALS: Goals reviewed with patient? Yes  SHORT TERM GOALS: Target date: 12/19/23  Pt will be independent with HEP in order to demonstrate participation in Physical Therapy POC.  Baseline: Goal status: IN PROGRESS  2.  Pt will report 5/10 pain with mobility in order to demonstrate improved pain with ADLs.  Baseline:  Goal status: IN PROGRESS  LONG TERM GOALS: Target  date: 01/09/24  Pt will improve LLE strength to at least 4+/5 in order to demonstrate improved functional strength to return to desired activities.  Baseline: See above Goal status: IN PROGRESS  2.  Pt will improve 2 MWT by 140 feet in order to demonstrate improved functional ambulatory capacity in community setting.  Baseline: 400 feet Goal status: INITIAL  3.  Pt will improve Modified Oswestry score to <5 in order to demonstrate improved pain with functional goals and outcomes. Baseline: 15/50 Goal status: INITIAL  4.  Pt will report 3/10 pain with mobility in order to demonstrate reduced pain with ADLs lasting greater than 30 minutes.  Baseline: 7/10 Goal status: IN PROGRESS   PLAN:  PT FREQUENCY: 2x/week  PT DURATION: 6 weeks  PLANNED INTERVENTIONS: 97110-Therapeutic exercises, 97530- Therapeutic activity, 97112- Neuromuscular re-education, 97535- Self Care, [id number]- Manual therapy, [phone]- Gait training, Balance training, Stair training, Joint mobilization, Joint manipulation, Spinal manipulation, Spinal mobilization, Cryotherapy, and Moist heat.  PLAN FOR NEXT SESSION: Review HEP, progress core strengthening and then progress functional strength.   Lorenso Romance, PTA/CLT [facility] Ph: [phone] 9:03 AM, 01/05/24

## 2024-01-06 NOTE — Assessment & Plan Note (Signed)
 She has an upcoming appointment with new pain management on 5/19, Dr. Dorn Gaskins.  Will refill gabapentin at this time until she can get in with pain management.  She has taken this in the past for her back pain.  Robaxin also added for back pain.

## 2024-01-06 NOTE — Assessment & Plan Note (Signed)
 Add gabapentin and Robaxin until she can establish with new pain management.

## 2024-01-07 ENCOUNTER — Ambulatory Visit (HOSPITAL_COMMUNITY)

## 2024-01-07 ENCOUNTER — Encounter (HOSPITAL_COMMUNITY): Payer: Self-pay

## 2024-01-07 ENCOUNTER — Other Ambulatory Visit: Payer: Self-pay | Admitting: Internal Medicine

## 2024-01-07 DIAGNOSIS — M5459 Other low back pain: Secondary | ICD-10-CM

## 2024-01-07 DIAGNOSIS — M542 Cervicalgia: Secondary | ICD-10-CM

## 2024-01-07 DIAGNOSIS — R29898 Other symptoms and signs involving the musculoskeletal system: Secondary | ICD-10-CM

## 2024-01-07 NOTE — Therapy (Signed)
 OUTPATIENT PHYSICAL THERAPY THORACOLUMBAR/CERVICAL TREATMENT   Patient Name: Debbie Santana MRN: [id number] DOB:[birth year], 33 y.o., female Today's Date: 01/07/2024  END OF SESSION:  PT End of Session - 01/07/24 0925     Visit Number 6    Number of Visits 13    Date for PT Re-Evaluation 01/09/24    Authorization Type [place] MEDICA[employer] HE[employer] BLUE    Authorization Time Period 6 visits approved 11/28/23-01/26/24    Authorization - Visit Number 5    Authorization - Number of Visits 6    Progress Note Due on Visit 10    PT Start Time 0930    PT Stop Time 1008    PT Time Calculation (min) 38 min    Activity Tolerance Patient tolerated treatment well;Patient limited by pain    Behavior During Therapy [facility] for tasks assessed/performed                  Past Medical History:  Diagnosis Date   Anxiety    Bacterial vaginosis    Bradycardia 04/17/2017   Chest pain on breathing 02/22/2020   Chronic back pain    Depression    Erosive gastritis 04/11/2017   Gastric ulcer    Genital warts    GERD (gastroesophageal reflux disease)    High cholesterol    History of gestational hypertension 05/21/2018   Dx intrapartum   History of kidney stones    Intertrigo 11/16/2019   Intractable abdominal pain 04/11/2017   Intractable nausea and vomiting 12/07/2018   Intractable vomiting 05/20/2020   Irregular intermenstrual bleeding 02/19/2019   Mental disorder    Migraine without aura and without status migrainosus, not intractable 02/19/2019   Nausea 05/10/2020   Nausea & vomiting 12/07/2018   Nexplanon in place 02/19/2019   Obesity 04/17/2017   Panic disorder    Peptic ulcer disease 04/13/2017   Pinched nerve 09/24/2019   Postpartum hypertension 06/23/2018   requiring norvasc, resolved at pp visit   Sciatica    Scoliosis    Tremor of both hands    Trichomoniasis    Vaginal Pap smear, abnormal    Wheezing    Past Surgical History:  Procedure Laterality Date   BACK SURGERY      ruptured disc.   BACK SURGERY  11/2021   BIOPSY  04/14/2017   Procedure: BIOPSY;  Surgeon: Ruby Corporal, MD;  [facility];  Service: Endoscopy;;  gastric   BIOPSY  05/22/2020   Procedure: BIOPSY;  Surgeon: Suzette Espy, MD;  [facility];  Service: Endoscopy;;   BIOPSY  08/29/2020   Procedure: BIOPSY;  Surgeon: Urban Garden, MD;  [facility];  Service: Gastroenterology;;   BIOPSY  09/09/2020   Procedure: BIOPSY;  Surgeon: Vinetta Greening, DO;  [facility];  Service: Endoscopy;;  duodenum;antral   BIOPSY  11/21/2021   Procedure: BIOPSY;  Surgeon: Ruby Corporal, MD;  [facility];  Service: Endoscopy;;   BIOPSY  03/06/2023   Procedure: BIOPSY;  Surgeon: Urban Garden, MD;  [facility];  Service: Gastroenterology;;   CERVICAL ABLATION N/A 12/02/2018   Procedure: LASER ABLATION OF CERVIX;  Surgeon: Wendelyn Halter, MD;  Location: [facility];  Service: Gynecology;  Laterality: N/A;   CHOLECYSTECTOMY N/A 04/17/2017   Procedure: LAPAROSCOPIC CHOLECYSTECTOMY;  Surgeon: Alanda Allegra, MD;  Location: [facility];  Service: General;  Laterality: N/A;   COLONOSCOPY WITH PROPOFOL N/A 08/29/2020   Procedure: COLONOSCOPY  WITH PROPOFOL;  Surgeon: Umberto Ganong, Bearl Limes, MD;  [facility];  Service: Gastroenterology;  Laterality: N/A;  11:15   COLPOSCOPY     ESOPHAGOGASTRODUODENOSCOPY (EGD) WITH PROPOFOL N/A 04/14/2017   Procedure: ESOPHAGOGASTRODUODENOSCOPY (EGD) WITH PROPOFOL;  Surgeon: Ruby Corporal, MD;  [facility];  Service: Endoscopy;  Laterality: N/A;   ESOPHAGOGASTRODUODENOSCOPY (EGD) WITH PROPOFOL N/A 05/22/2020   Procedure: ESOPHAGOGASTRODUODENOSCOPY (EGD) WITH PROPOFOL;  Surgeon: Suzette Espy, MD;  [facility];  Service: Endoscopy;  Laterality: N/A;   ESOPHAGOGASTRODUODENOSCOPY (EGD) WITH PROPOFOL N/A 09/09/2020   Procedure: ESOPHAGOGASTRODUODENOSCOPY (EGD)  WITH PROPOFOL;  Surgeon: Vinetta Greening, DO;  [facility];  Service: Endoscopy;  Laterality: N/A;   ESOPHAGOGASTRODUODENOSCOPY (EGD) WITH PROPOFOL N/A 11/21/2020   Procedure: ESOPHAGOGASTRODUODENOSCOPY (EGD) WITH PROPOFOL;  Surgeon: Urban Garden, MD;  [facility];  Service: Gastroenterology;  Laterality: N/A;  Am   ESOPHAGOGASTRODUODENOSCOPY (EGD) WITH PROPOFOL N/A 11/21/2021   Procedure: ESOPHAGOGASTRODUODENOSCOPY (EGD) WITH PROPOFOL;  Surgeon: Ruby Corporal, MD;  [facility];  Service: Endoscopy;  Laterality: N/A;  820   ESOPHAGOGASTRODUODENOSCOPY (EGD) WITH PROPOFOL N/A 03/06/2023   Procedure: ESOPHAGOGASTRODUODENOSCOPY (EGD) WITH PROPOFOL;  Surgeon: Urban Garden, MD;  [facility];  Service: Gastroenterology;  Laterality: N/A;  11:45AM;ASA 2   HERNIA REPAIR Bilateral    inguinal   LASER ABLATION CONDOLAMATA N/A 12/02/2018   Procedure: LASER ABLATION CONDYLOMA ACCUMINATA LEFT AND RIGHT VULVA, PERINEUM AND PERIANAL (15 TOTAL);  Surgeon: Wendelyn Halter, MD;  Location: [facility];  Service: Gynecology;  Laterality: N/A;   LUMBAR LAMINECTOMY/DECOMPRESSION MICRODISCECTOMY Right 12/03/2021   Procedure: MICRODISCECTOMY Lumbar four- five;  Surgeon: Garry Kansas, MD;  [facility];  Service: Neurosurgery;  Laterality: Right;   MOUTH SURGERY  09/2021   All top teeth and all wisdom teeth removed   MOUTH SURGERY  12/2021   POLYPECTOMY  08/29/2020   Procedure: POLYPECTOMY;  Surgeon: Urban Garden, MD;  [facility];  Service: Gastroenterology;;   Patient Active Problem List   Diagnosis Date Noted   Screening examination for STD (sexually transmitted disease) 08/05/2023   Herpes 08/05/2023   Vaginal irritation 08/05/2023   Vaginal discharge 08/05/2023   Urinary incontinence 08/05/2023   Continuous urine leakage 07/15/2023   Essential tremor 05/01/2023   History of migraine headaches 05/01/2023    History of UTI 02/18/2023   Hematuria 02/18/2023   Trichomoniasis 02/04/2023   Substance abuse (HCC) 12/17/2022   Bartholin's gland cyst 12/17/2022   Dysmenorrhea 10/24/2022   Dyspareunia, female 10/24/2022   Pelvic pain 10/24/2022   Helicobacter positive gastritis 05/07/2022   Erosive gastropathy 05/07/2022   Boil 04/12/2022   Positive H. 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) 08/23/2020   Post laminectomy syndrome 08/23/2020   Frequent headaches 08/10/2020   Nexplanon in place 08/10/2020   Irregular intermenstrual bleeding 08/10/2020   Obesity (BMI 30-39.9) 05/20/2020   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   Panic attack 05/18/2020   Epigastric pain 05/18/2020   Agitated 05/18/2020   Gastroesophageal reflux disease 05/10/2020   Alternating constipation and  diarrhea 05/10/2020   Bilateral wheezing 02/22/2020   Sleep disturbance 02/22/2020   Anxiety and depression 02/19/2019   Intractable nausea and vomiting 12/07/2018   Low back pain 12/07/2018   Chronic diarrhea 12/07/2018   Nausea & vomiting 12/07/2018   Condyloma 04/15/2018   Abnormal Pap smear of cervix 11/04/2017   Reflux esophagitis 04/17/2017   Tobacco abuse 04/17/2017   Gastro-esophageal reflux disease with esophagitis 04/17/2017   Peptic ulcer disease 04/13/2017   Erosive gastritis 04/11/2017   Leukocytosis 04/11/2017   Anxiety 04/11/2017    PCP: Tobi Fortes, MD   REFERRING PROVIDER: Garry Kansas, MD  REFERRING DIAG: M54.2 (ICD-10-CM) - Cervicalgia M51.16 (ICD-10-CM) -  Herniation of lumbar intervertebral disc with radiculopathy  Rationale for Evaluation and Treatment: Rehabilitation  THERAPY DIAG:  Other low back pain  Weakness of left lower extremity  Neck pain  ONSET DATE: over 6 months ago  SUBJECTIVE:                                                                                                                                                                                           SUBJECTIVE STATEMENT: Pt states pain is a 6/10 this morning. Pt continues to report HEP non compliance. Pt reports starting gabapentin and a muscle relaxer since last doctors appointment and state they seem to be helping.     PERTINENT HISTORY:  History of two low back surgeries, one a couple of years ago, the other when pt was 16.   PAIN:  Are you having pain? Yes: NPRS scale: 8/10 Pain location: lower cervical spine, lumbar spine, legs ache occasionally Pain description: dull, occasionally sharp spurts (low back) and sharp (neck) Aggravating factors: working around the house, arm movements and lifting for the neck, lifting from floor and standing/sitting for long periods of time for the low back Relieving factors: heat, [place], laying down flat, keeping arms above head  PRECAUTIONS: None  RED FLAGS: None   WEIGHT BEARING RESTRICTIONS: No  FALLS:  Has patient fallen in last 6 months? Yes. Number of falls 1, just tripped  OCCUPATION: homemaker  PATIENT GOALS: decrease the pain in neck and low back, everyday tasks with decreased breaks.  NEXT MD VISIT: none, waiting to complete therapy  OBJECTIVE:  Note: Objective measures were completed at Evaluation unless otherwise noted.  PATIENT SURVEYS:  Modified Oswestry 20/50  NDI 15/50  COGNITION: Overall cognitive status: Within functional limits for tasks assessed     SENSATION: Hands go numb almost everyday    POSTURE: rounded shoulders, forward head, decreased lumbar lordosis, and increased  thoracic kyphosis  PALPATION: Tenderness to palpation of paraspinal  musculature from C6-T4 and L1-L4. Spinous processes sensitive to touch as well of the same spinal segments with decreased mobility. CERVICAL ROM:   AROM eval  Flexion 40  Extension 35  Right lateral flexion 45  Left lateral flexion 40, pain  Right rotation 64  Left rotation 44, pain   (Blank rows = not tested) LUMBAR ROM:  AROM eval  Flexion 50, pain  Extension 15, pain  Right lateral flexion 16, pain  Left lateral flexion 15, pain  Right rotation 75%, pain on left side  Left rotation 100%   (Blank rows = not tested)   LOWER EXTREMITY MMT:    MMT Right eval Left eval  Hip flexion 4- 4-  Hip extension 4- 4-  Hip abduction 4+ 4-  Hip adduction    Hip internal rotation    Hip external rotation    Knee flexion 4+ 4  Knee extension 4+ 4  Ankle dorsiflexion 5 5  Ankle plantarflexion    Ankle inversion    Ankle eversion     (Blank rows = not tested)  CERVICAL SPECIAL TESTS:  Spurling's Compression test, negative bilaterally for radicular symptoms, pt reports neck pain during compression of left cervical spine  FUNCTIONAL TESTS:  2 minute walk test: 400 feet   30 second chair stand test: 6.5 reps GAIT: Distance walked: 400 Assistive device utilized: None Level of assistance: Complete Independence Comments: Pt demonstrates abnormally decreased gait speed and demonstrates mild varying of stride lengths especially during making 90 degree turns during .  TREATMENT DATE:  01/07/2024  -MHP, 8 minutes at the start of therapy Manual Therapy: -CPA of Lumbar Spinal segments L3-L5, grade I-II mobilizations -STM of Lumbar Paraspinal musculature  Therapeutic Exercise: -Prone on elbows, 2 sets of 30 second holds -Supine bridges 2 sets of 10 reps, 3 second holds, pt cued for pain free ROM -PPT 2 set of 10 reps, 3 second holds, pt cued to remain in pain free ROM, 2nd set with march -Single LE bent knee  fallout, 1 set of 10 reps bilaterally, pt cued to remain in pain free ROM -Seated lumbar extensions, 1 set of 10 reps, 2 second holds, pt cued for slow and controlled movements    01/05/24 Supine:  LTR 2X10 bilaterally 5" holds each direction Bridge 2X10X5" hlds Bridge on Green physioball 2X10 5" holds SLR 2X10 each LE Rectus abd isometric with green physioball 2 sets 10X5" Seated:  STS no UE 2X10 Long sitting hamstring stretch 3X30" each Piriformis stretch 3X30" each  12/30/2023  -MHP 5 minutes in supine at end of session Therapeutic Exercise: -DKTC on green exercise ball, 3 set of 10 reps -LTR, 2 set of 10 reps bilaterally, symptomatic to the right -Supine bridges on green exercise ball 2 sets of 10 reps, 3 second holds, pt cued for max hip extension -Fwd/Bwd stepping with RTB w RTB 1 lap 20 feet per lap -Lateral stepping, w RTB 1 lap 20 feet per lap, second 2 with RTB around ankles, pt cued for upright posture -Monster walks RTB, 1 laps 20 feet per lap, multiple VC required -Green exercise ball core pushdowns on plynth table, forward/side to side, 5 second holds 10 reps, 2 sets each direction  Therapeutic Activity: -Sled pushes/pulls with 30lbs, cueing for core activation -Squat to chair, 2 sets of 10 reps, with trampoline ball toss, pt cued for core activation -PPT 1 set of 20 reps, 3 second holds, pr cued to remain in pain free ROM, requires multiple VC for  proper form  12/24/2023  Therapeutic Exercise: -DKTC on green exercise ball, 1 set of 20 reps -LTR on green exercise ball, 1 set of 20 reps bilaterally -Supine bridges 2 sets of 10 reps, 3 second holds, symptomatic, pt cued for max hip extension -Lateral stepping, w RTB 3 laps 20 feet per lap, second 2 with RTB around ankles, pt cued for upright posture -Monster walks RTB, 2 laps 20 feet per lap, multiple VC required  Therapeutic Activity: -Squat to chair, 2 sets of 10 reps, pt cued for core activation -PPT 1 set of 10  reps, 3 second holds, pr cued to remain in pain free ROM, requires multiple VC for proper form                                                                                 PATIENT EDUCATION:  Education details: Pt was educated on findings of PT evaluation, prognosis, frequency of therapy visits and rationale, attendance policy, and HEP if given.   Person educated: Patient Education method: Explanation, Demonstration, Tactile cues, Verbal cues, and Handouts Education comprehension: verbalized understanding, returned demonstration, verbal cues required, and tactile cues required  HOME EXERCISE PROGRAM: Access Code: [email or web address] URL: [email or web address]  Date: 11/28/2023 Prepared by: Luz Lex Exercises - Seated Cervical Retraction  - 1 x daily - 7 x weekly - 3 sets - 10 reps - Seated Scapular Retraction  - 1 x daily - 7 x weekly - 3 sets - 10 reps - Supine Posterior Pelvic Tilt  - 1 x daily - 7 x weekly - 3 sets - 10 reps  Date: 01/05/2024 Prepared by: Emeline Gins Exercises - Supine Transversus Abdominis Bracing - Hands on Stomach  - 2 x daily - 7 x weekly - 2 sets - 10 reps - 5 sec hold - Beginner Bridge  - 2 x daily - 7 x weekly - 2 sets - 10 reps - Small Range Straight Leg Raise  - 2 x daily - 7 x weekly - 2 sets - 10 reps - Seated Table Hamstring Stretch  - 2 x daily - 7 x weekly - 3 sets - 3 reps - 30 sec hold - Seated Piriformis Stretch with Trunk Bend  - 2 x daily - 7 x weekly - 2 sets - 3 reps - 30 sec hold - Sit to Stand  - 2 x daily - 7 x weekly - 2 sets - 10 reps   ASSESSMENT:  CLINICAL IMPRESSION: Patient continues to demonstrate decreased pain free lumbar spine ROM, LE strength, and decreased functional mobility. PT regressed core strengthening and LE strengthening due to pain increasing the last few sessions. Patient able to perform familiar exercises with good performance with verbal cueing for starting positions. Pt able to initiate seated lumbar  extensions for attempt to centralize pts pain from LLE, pt reports no symptom increase. Patient would continue to benefit from skilled physical therapy for increased pain free functional mobility, increased LE and core strength, and improved mobilization of lumbar spine for improved quality of life, improved independence with ADLs and continued progress towards therapy goals.    OBJECTIVE IMPAIRMENTS: Abnormal gait, decreased activity tolerance, decreased endurance, decreased mobility, decreased  ROM, decreased strength, and pain.   ACTIVITY LIMITATIONS: carrying, lifting, bending, sitting, standing, squatting, stairs, and transfers  PARTICIPATION LIMITATIONS: meal prep, cleaning, laundry, driving, community activity, and yard work  PERSONAL FACTORS: Age and 1-2 comorbidities: Anxiety, Chronic back pain  are also affecting patient's functional outcome.   REHAB POTENTIAL: Good  CLINICAL DECISION MAKING: Stable/uncomplicated  EVALUATION COMPLEXITY: Low   GOALS: Goals reviewed with patient? Yes  SHORT TERM GOALS: Target date: 12/19/23  Pt will be independent with HEP in order to demonstrate participation in Physical Therapy POC.  Baseline: Goal status: IN PROGRESS  2.  Pt will report 5/10 pain with mobility in order to demonstrate improved pain with ADLs.  Baseline:  Goal status: IN PROGRESS  LONG TERM GOALS: Target date: 01/09/24  Pt will improve LLE strength to at least 4+/5 in order to demonstrate improved functional strength to return to desired activities.  Baseline: See above Goal status: IN PROGRESS  2.  Pt will improve 2 MWT by 140 feet in order to demonstrate improved functional ambulatory capacity in community setting.  Baseline: 400 feet Goal status: INITIAL  3.  Pt will improve Modified Oswestry score to <5 in order to demonstrate improved pain with functional goals and outcomes. Baseline: 15/50 Goal status: INITIAL  4.  Pt will report 3/10 pain with mobility in  order to demonstrate reduced pain with ADLs lasting greater than 30 minutes.  Baseline: 7/10 Goal status: IN PROGRESS   PLAN:  PT FREQUENCY: 2x/week  PT DURATION: 6 weeks  PLANNED INTERVENTIONS: 97110-Therapeutic exercises, 97530- Therapeutic activity, 97112- Neuromuscular re-education, 97535- Self Care, [id number]- Manual therapy, [id number]- Gait training, Balance training, Stair training, Joint mobilization, Joint manipulation, Spinal manipulation, Spinal mobilization, Cryotherapy, and Moist heat.  PLAN FOR NEXT SESSION: Review HEP, progress core strengthening and then progress functional strength.   Armond Bertin, PT, DPT [facility] Office: [phone] 10:16 AM, 01/07/24

## 2024-01-08 MED ORDER — BUTALBITAL-APAP-CAFFEINE 50-325-40 MG PO TABS: 1.0000 | ORAL_TABLET | Freq: Two times a day (BID) | ORAL | 0 refills | Status: DC | PRN

## 2024-01-12 ENCOUNTER — Encounter (HOSPITAL_COMMUNITY)

## 2024-01-14 ENCOUNTER — Ambulatory Visit (HOSPITAL_COMMUNITY)

## 2024-01-14 ENCOUNTER — Encounter (HOSPITAL_COMMUNITY): Payer: Self-pay

## 2024-01-14 DIAGNOSIS — M542 Cervicalgia: Secondary | ICD-10-CM

## 2024-01-14 DIAGNOSIS — M5459 Other low back pain: Secondary | ICD-10-CM

## 2024-01-14 DIAGNOSIS — R29898 Other symptoms and signs involving the musculoskeletal system: Secondary | ICD-10-CM

## 2024-01-14 NOTE — Therapy (Signed)
 OUTPATIENT PHYSICAL THERAPY THORACOLUMBAR/CERVICAL TREATMENT   Patient Name: Debbie Santana MRN: [id number] DOB:12-07-1990, 33 y.o., female Today's Date: 01/14/2024  END OF SESSION:  PT End of Session - 01/14/24 0902     Visit Number 7    Number of Visits 13    Date for PT Re-Evaluation 02/11/24    Authorization Type [place] MEDICAID HEALTHY BLUE    Authorization Time Period seeking additional auth    Authorization - Visit Number 6    Authorization - Number of Visits 6    Progress Note Due on Visit 10    PT Start Time 0845    PT Stop Time 0925    PT Time Calculation (min) 40 min    Activity Tolerance Patient tolerated treatment well;Patient limited by pain    Behavior During Therapy [facility] for tasks assessed/performed                   Past Medical History:  Diagnosis Date   Anxiety    Bacterial vaginosis    Bradycardia 04/17/2017   Chest pain on breathing 02/22/2020   Chronic back pain    Depression    Erosive gastritis 04/11/2017   Gastric ulcer    Genital warts    GERD (gastroesophageal reflux disease)    High cholesterol    History of gestational hypertension 05/21/2018   Dx intrapartum   History of kidney stones    Intertrigo 11/16/2019   Intractable abdominal pain 04/11/2017   Intractable nausea and vomiting 12/07/2018   Intractable vomiting 05/20/2020   Irregular intermenstrual bleeding 02/19/2019   Mental disorder    Migraine without aura and without status migrainosus, not intractable 02/19/2019   Nausea 05/10/2020   Nausea & vomiting 12/07/2018   Nexplanon  in place 02/19/2019   Obesity 04/17/2017   Panic disorder    Peptic ulcer disease 04/13/2017   Pinched nerve 09/24/2019   Postpartum hypertension 06/23/2018   requiring norvasc , resolved at pp visit   Sciatica    Scoliosis    Tremor of both hands    Trichomoniasis    Vaginal Pap smear, abnormal    Wheezing    Past Surgical History:  Procedure Laterality Date   BACK SURGERY     ruptured  disc.   BACK SURGERY  11/2021   BIOPSY  04/14/2017   Procedure: BIOPSY;  Surgeon: Ruby Corporal, MD;  [facility];  Service: Endoscopy;;  gastric   BIOPSY  05/22/2020   Procedure: BIOPSY;  Surgeon: Suzette Espy, MD;  [facility];  Service: Endoscopy;;   BIOPSY  08/29/2020   Procedure: BIOPSY;  Surgeon: Urban Garden, MD;  [facility];  Service: Gastroenterology;;   BIOPSY  09/09/2020   Procedure: BIOPSY;  Surgeon: Vinetta Greening, DO;  [facility];  Service: Endoscopy;;  duodenum;antral   BIOPSY  11/21/2021   Procedure: BIOPSY;  Surgeon: Ruby Corporal, MD;  [facility];  Service: Endoscopy;;   BIOPSY  03/06/2023   Procedure: BIOPSY;  Surgeon: Urban Garden, MD;  [facility];  Service: Gastroenterology;;   CERVICAL ABLATION N/A 12/02/2018   Procedure: LASER ABLATION OF CERVIX;  Surgeon: Wendelyn Halter, MD;  [facility]: [facility];  Service: Gynecology;  Laterality: N/A;   CHOLECYSTECTOMY N/A 04/17/2017   Procedure: LAPAROSCOPIC CHOLECYSTECTOMY;  Surgeon: Alanda Allegra, MD;  [facility]: [facility];  Service: General;  Laterality: N/A;   COLONOSCOPY WITH PROPOFOL  N/A 08/29/2020   Procedure: COLONOSCOPY  WITH PROPOFOL ;  Surgeon: Umberto Ganong, Bearl Limes, MD;  [facility];  Service: Gastroenterology;  Laterality: N/A;  11:15   COLPOSCOPY     ESOPHAGOGASTRODUODENOSCOPY (EGD) WITH PROPOFOL  N/A 04/14/2017   Procedure: ESOPHAGOGASTRODUODENOSCOPY (EGD) WITH PROPOFOL ;  Surgeon: Ruby Corporal, MD;  [facility];  Service: Endoscopy;  Laterality: N/A;   ESOPHAGOGASTRODUODENOSCOPY (EGD) WITH PROPOFOL  N/A 05/22/2020   Procedure: ESOPHAGOGASTRODUODENOSCOPY (EGD) WITH PROPOFOL ;  Surgeon: Suzette Espy, MD;  [facility];  Service: Endoscopy;  Laterality: N/A;   ESOPHAGOGASTRODUODENOSCOPY (EGD) WITH PROPOFOL  N/A 09/09/2020   Procedure: ESOPHAGOGASTRODUODENOSCOPY (EGD) WITH  PROPOFOL ;  Surgeon: Vinetta Greening, DO;  [facility];  Service: Endoscopy;  Laterality: N/A;   ESOPHAGOGASTRODUODENOSCOPY (EGD) WITH PROPOFOL  N/A 11/21/2020   Procedure: ESOPHAGOGASTRODUODENOSCOPY (EGD) WITH PROPOFOL ;  Surgeon: Urban Garden, MD;  [facility];  Service: Gastroenterology;  Laterality: N/A;  Am   ESOPHAGOGASTRODUODENOSCOPY (EGD) WITH PROPOFOL  N/A 11/21/2021   Procedure: ESOPHAGOGASTRODUODENOSCOPY (EGD) WITH PROPOFOL ;  Surgeon: Ruby Corporal, MD;  [facility];  Service: Endoscopy;  Laterality: N/A;  820   ESOPHAGOGASTRODUODENOSCOPY (EGD) WITH PROPOFOL  N/A 03/06/2023   Procedure: ESOPHAGOGASTRODUODENOSCOPY (EGD) WITH PROPOFOL ;  Surgeon: Urban Garden, MD;  [facility];  Service: Gastroenterology;  Laterality: N/A;  11:45AM;ASA 2   HERNIA REPAIR Bilateral    inguinal   LASER ABLATION CONDOLAMATA N/A 12/02/2018   Procedure: LASER ABLATION CONDYLOMA ACCUMINATA LEFT AND RIGHT VULVA, PERINEUM AND PERIANAL (15 TOTAL);  Surgeon: Wendelyn Halter, MD;  Location: [facility];  Service: Gynecology;  Laterality: N/A;   LUMBAR LAMINECTOMY/DECOMPRESSION MICRODISCECTOMY Right 12/03/2021   Procedure: MICRODISCECTOMY Lumbar four- five;  Surgeon: Garry Kansas, MD;  Location: [facility];  Service: Neurosurgery;  Laterality: Right;   MOUTH SURGERY  09/2021   All top teeth and all wisdom teeth removed   MOUTH SURGERY  12/2021   POLYPECTOMY  08/29/2020   Procedure: POLYPECTOMY;  Surgeon: Urban Garden, MD;  [facility];  Service: Gastroenterology;;   Patient Active Problem List   Diagnosis Date Noted   Screening examination for STD (sexually transmitted disease) 08/05/2023   Herpes 08/05/2023   Vaginal irritation 08/05/2023   Vaginal discharge 08/05/2023   Urinary incontinence 08/05/2023   Continuous urine leakage 07/15/2023   Essential tremor 05/01/2023   History of migraine headaches 05/01/2023    History of UTI 02/18/2023   Hematuria 02/18/2023   Trichomoniasis 02/04/2023   Substance abuse (HCC) 12/17/2022   Bartholin's gland cyst 12/17/2022   Dysmenorrhea 10/24/2022   Dyspareunia, female 10/24/2022   Pelvic pain 10/24/2022   Helicobacter positive gastritis 05/07/2022   Erosive gastropathy 05/07/2022   Boil 04/12/2022   Positive H. 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) 08/23/2020   Post laminectomy syndrome 08/23/2020   Frequent headaches 08/10/2020   Nexplanon  in place 08/10/2020   Irregular intermenstrual bleeding 08/10/2020   Obesity (BMI 30-39.9) 05/20/2020   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   Panic attack 05/18/2020   Epigastric pain 05/18/2020   Agitated 05/18/2020   Gastroesophageal reflux disease 05/10/2020   Alternating constipation and  diarrhea 05/10/2020   Bilateral wheezing 02/22/2020   Sleep disturbance 02/22/2020   Anxiety and depression 02/19/2019   Intractable nausea and vomiting 12/07/2018   Low back pain 12/07/2018   Chronic diarrhea 12/07/2018   Nausea & vomiting 12/07/2018   Condyloma 04/15/2018   Abnormal Pap smear of cervix 11/04/2017   Reflux esophagitis 04/17/2017   Tobacco abuse 04/17/2017   Gastro-esophageal reflux disease with esophagitis 04/17/2017   Peptic ulcer disease 04/13/2017   Erosive gastritis 04/11/2017   Leukocytosis 04/11/2017   Anxiety 04/11/2017    PCP: Tobi Fortes, MD   REFERRING PROVIDER: Garry Kansas, MD  REFERRING DIAG: M54.2 (ICD-10-CM) - Cervicalgia M51.16 (ICD-10-CM) -  Herniation of lumbar intervertebral disc with radiculopathy  Rationale for Evaluation and Treatment: Rehabilitation  THERAPY DIAG:  Other low back pain  Weakness of left lower extremity  Neck pain  ONSET DATE: over 6 months ago  SUBJECTIVE:                                                                                                                                                                                           SUBJECTIVE STATEMENT: Pt states her back pain is feeling better this morning (2/10 pain reported) but has just started taking the gabapentin  and a muscle relaxer last Monday. Pt reports soreness from having a tooth removed this Monday. Pt does reports dizziness with walking and thinks it is coming from the medications.     PERTINENT HISTORY:  History of two low back surgeries, one a couple of years ago, the other when pt was 16.   PAIN:  Are you having pain? Yes: NPRS scale: 8/10 Pain location: lower cervical spine, lumbar spine, legs ache occasionally Pain description: dull, occasionally sharp spurts (low back) and sharp (neck) Aggravating factors: working around the house, arm movements and lifting for the neck, lifting from floor and standing/sitting for long periods of time for the low back Relieving factors: heat, [place] , laying down flat, keeping arms above head  PRECAUTIONS: None  RED FLAGS: None   WEIGHT BEARING RESTRICTIONS: No  FALLS:  Has patient fallen in last 6 months? Yes. Number of falls 1, just tripped  OCCUPATION: homemaker  PATIENT GOALS: decrease the pain in neck and low back, everyday tasks with decreased breaks.  NEXT MD VISIT: none, waiting to complete therapy  OBJECTIVE:  Note: Objective measures were completed at Evaluation unless otherwise noted.  PATIENT SURVEYS:  Modified Oswestry 20/50  NDI 15/50 Modified Oswestry 19/50 (01/14/24) COGNITION: Overall cognitive status: Within functional limits for tasks  assessed     SENSATION: Hands go numb almost everyday  POSTURE: rounded shoulders, forward head, decreased lumbar lordosis, and increased thoracic kyphosis  PALPATION: Tenderness to palpation of paraspinal musculature from C6-T4 and L1-L4. Spinous processes sensitive to touch as well of the same spinal segments with decreased mobility. CERVICAL ROM:   AROM eval  Flexion 40  Extension 35  Right lateral flexion 45  Left lateral flexion 40, pain  Right rotation 64  Left rotation 44, pain   (Blank rows = not tested) LUMBAR ROM:  AROM eval   Flexion 50, pain 85, little pain  Extension 15, pain 20, little pain  Right lateral flexion 16, pain 20  Left lateral flexion 15, pain 25  Right rotation 75%, pain on left side 100%  Left rotation 100% 100%   (Blank rows = not tested)   LOWER EXTREMITY MMT:    MMT Right eval Left eval Right 01/14/24 Left 01/14/24  Hip flexion 4- 4- 4+ 4+  Hip extension 4- 4- 4+ 4+  Hip abduction 4+ 4- 5 4+  Hip adduction      Hip internal rotation      Hip external rotation      Knee flexion 4+ 4 4+ 4+  Knee extension 4+ 4 4+ 4+  Ankle dorsiflexion 5 5    Ankle plantarflexion      Ankle inversion      Ankle eversion       (Blank rows = not tested)  CERVICAL SPECIAL TESTS:  Spurling's Compression test, negative bilaterally for radicular symptoms, pt reports neck pain during compression of left cervical spine  FUNCTIONAL TESTS:  2 minute walk test: 400 feet   30 second chair stand test: 6.5 reps GAIT: Distance walked: 400 Assistive device utilized: None Level of assistance: Complete Independence Comments: Pt demonstrates abnormally decreased gait speed and demonstrates mild varying of stride lengths especially during making 90 degree turns during .  TREATMENT DATE:  01/14/2024  Therapeutic Exercise: -Nustep, 5 minute, level 3 resistance -PPT with march, 1 set of 10 reps, pt cued to remain in pain free ROM -LTR 1 set of 10 reps  bilaterally, pt cued to remain in pain free ROM Therapeutic Activity: -Strength, ROM assessments, goal tracking, education on role of PT and progress towards therapy goals -STS, 1 set of 8 reps throughout session   01/07/2024  -MHP, 8 minutes at the start of therapy Manual Therapy: -CPA of Lumbar Spinal segments L3-L5, grade I-II mobilizations -STM of Lumbar Paraspinal musculature  Therapeutic Exercise: -Prone on elbows, 2 sets of 30 second holds -Supine bridges 2 sets of 10 reps, 3 second holds, pt cued for pain free ROM -PPT 2 set of 10 reps, 3 second holds, pt cued to remain in pain free ROM, 2nd set with march -Single LE bent knee fallout, 1 set of 10 reps bilaterally, pt cued to remain in pain free ROM -Seated lumbar extensions, 1 set of 10 reps, 2 second holds, pt cued for slow and controlled movements    01/05/24 Supine:  LTR 2X10 bilaterally 5" holds each direction Bridge 2X10X5" hlds Bridge on Green physioball 2X10 5" holds SLR 2X10 each LE Rectus abd isometric with green physioball 2 sets 10X5" Seated:  STS no UE 2X10 Long sitting hamstring stretch 3X30" each Piriformis stretch 3X30" each  PATIENT EDUCATION:  Education details: Pt was educated on findings of PT evaluation, prognosis, frequency of therapy visits and rationale, attendance policy, and HEP if given.   Person educated: Patient Education method: Explanation, Demonstration, Tactile cues, Verbal cues, and Handouts Education comprehension: verbalized understanding, returned demonstration, verbal cues required, and tactile cues required  HOME EXERCISE PROGRAM: Access Code: [email or web address] URL: [email or web address]  Date: 11/28/2023 Prepared by: Armond Bertin Exercises - Seated Cervical Retraction  - 1 x daily - 7 x weekly - 3 sets - 10 reps - Seated Scapular Retraction  - 1 x daily - 7 x weekly - 3 sets - 10  reps - Supine Posterior Pelvic Tilt  - 1 x daily - 7 x weekly - 3 sets - 10 reps  Date: 01/05/2024 Prepared by: Vickii Grand Exercises - Supine Transversus Abdominis Bracing - Hands on Stomach  - 2 x daily - 7 x weekly - 2 sets - 10 reps - 5 sec hold - Beginner Bridge  - 2 x daily - 7 x weekly - 2 sets - 10 reps - Small Range Straight Leg Raise  - 2 x daily - 7 x weekly - 2 sets - 10 reps - Seated Table Hamstring Stretch  - 2 x daily - 7 x weekly - 3 sets - 3 reps - 30 sec hold - Seated Piriformis Stretch with Trunk Bend  - 2 x daily - 7 x weekly - 2 sets - 3 reps - 30 sec hold - Sit to Stand  - 2 x daily - 7 x weekly - 2 sets - 10 reps   ASSESSMENT:  CLINICAL IMPRESSION: Patient continues to demonstrate low back pain, decreased LE strength, decreased gait quality/endurance and functional mobility. Patient demonstrates ability to meet two out of six therapy goals on this day. Improvements have been observed in lumbar spine ROM with decreased pain, increased LE strength bilaterally, and increased performance on the 2 MWT. Pt still demonstrates decreased LE strength, decreased functional mobility, and impaired function on the Modified Oswestry. Patient would continue to benefit from skilled physical therapy for 4 more weeks, 2 visits per week for increased endurance with ambulation, increased LE strength, and improved perception of functionality for improved independence with management of LBP, increased community ambulation and continued progress towards remaining therapy goals.     OBJECTIVE IMPAIRMENTS: Abnormal gait, decreased activity tolerance, decreased endurance, decreased mobility, decreased ROM, decreased strength, and pain.   ACTIVITY LIMITATIONS: carrying, lifting, bending, sitting, standing, squatting, stairs, and transfers  PARTICIPATION LIMITATIONS: meal prep, cleaning, laundry, driving, community activity, and yard work  PERSONAL FACTORS: Age and 1-2 comorbidities: Anxiety,  Chronic back pain  are also affecting patient's functional outcome.   REHAB POTENTIAL: Good  CLINICAL DECISION MAKING: Stable/uncomplicated  EVALUATION COMPLEXITY: Low   GOALS: Goals reviewed with patient? Yes  SHORT TERM GOALS: Target date: 12/19/23 > 01/28/24  Pt will be independent with HEP in order to demonstrate participation in Physical Therapy POC.  Baseline: Goal status: NOT MET  2.  Pt will report 5/10 pain with mobility in order to demonstrate improved pain with ADLs.  Baseline:  Goal status: MET  LONG TERM GOALS: Target date: 01/09/24 > 02/17/24  Pt will improve LLE strength to at least 4+/5 in order to demonstrate improved functional strength to return to desired activities.  Baseline: See above Goal status: MET  2.  Pt will improve 2 MWT by 140 feet in order to demonstrate improved functional ambulatory capacity in community setting.  Baseline:  400 feet, 4/23: 428 feet Goal status: NOT MET  3.  Pt will improve Modified Oswestry score to <5 in order to demonstrate improved pain with functional goals and outcomes. Baseline: 15/50 eval, 4/23: 19/50 Goal status: NOT MET  4.  Pt will report 3/10 pain with mobility in order to demonstrate reduced pain with ADLs lasting greater than 30 minutes.  Baseline: 7/10 Goal status: IN PROGRESS   PLAN:  PT FREQUENCY: 2x/week  PT DURATION: 6 weeks  PLANNED INTERVENTIONS: 97110-Therapeutic exercises, 97530- Therapeutic activity, 97112- Neuromuscular re-education, 97535- Self Care, [id number]- Manual therapy, [phone]- Gait training, Balance training, Stair training, Joint mobilization, Joint manipulation, Spinal manipulation, Spinal mobilization, Cryotherapy, and Moist heat.  PLAN FOR NEXT SESSION: Educate on importance of HEP, to continue PT visits for 4 weeks, 2 visits per week   Armond Bertin, PT, DPT [facility] Office: [phone] 10:34 AM, 01/14/24    Managed Medicaid Authorization Request  Visit  Dx Codes: M54.59 ; R29.898 ; M54.2 ; M54.2 ; M51.16   Functional Tool Score: Modified Oswestry 19/50  For all possible CPT codes, reference the Planned Interventions line above.     Check all conditions that are expected to impact treatment: {Conditions expected to impact treatment:None of these apply   If treatment provided at initial evaluation, no treatment charged due to lack of authorization.

## 2024-01-18 ENCOUNTER — Other Ambulatory Visit: Payer: Self-pay | Admitting: Internal Medicine

## 2024-01-19 MED ORDER — CLONAZEPAM 1 MG PO TABS: 1.0000 mg | ORAL_TABLET | Freq: Four times a day (QID) | ORAL | 0 refills | Status: DC | PRN

## 2024-01-21 ENCOUNTER — Other Ambulatory Visit: Payer: Self-pay | Admitting: Internal Medicine

## 2024-01-21 ENCOUNTER — Ambulatory Visit (HOSPITAL_COMMUNITY): Admitting: Physical Therapy

## 2024-01-21 DIAGNOSIS — M5459 Other low back pain: Secondary | ICD-10-CM

## 2024-01-21 DIAGNOSIS — R29898 Other symptoms and signs involving the musculoskeletal system: Secondary | ICD-10-CM

## 2024-01-21 DIAGNOSIS — M542 Cervicalgia: Secondary | ICD-10-CM

## 2024-01-21 NOTE — Therapy (Signed)
 OUTPATIENT PHYSICAL THERAPY THORACOLUMBAR/CERVICAL TREATMENT   Patient Name: Debbie Santana MRN: [id number] DOB:Jul 12, 1991, 33 y.o., female Today's Date: 01/21/2024  END OF SESSION:  PT End of Session - 01/21/24 1616     Visit Number 8    Number of Visits 13    Date for PT Re-Evaluation 02/11/24    Authorization Type [place] MEDICAID HEALTHY BLUE    Authorization Time Period seeking additional auth    Authorization - Number of Visits 6    Progress Note Due on Visit 10    PT Start Time 0935    PT Stop Time 1015    PT Time Calculation (min) 40 min    Activity Tolerance Patient tolerated treatment well;Patient limited by pain    Behavior During Therapy [facility] for tasks assessed/performed                    Past Medical History:  Diagnosis Date   Anxiety    Bacterial vaginosis    Bradycardia 04/17/2017   Chest pain on breathing 02/22/2020   Chronic back pain    Depression    Erosive gastritis 04/11/2017   Gastric ulcer    Genital warts    GERD (gastroesophageal reflux disease)    High cholesterol    History of gestational hypertension 05/21/2018   Dx intrapartum   History of kidney stones    Intertrigo 11/16/2019   Intractable abdominal pain 04/11/2017   Intractable nausea and vomiting 12/07/2018   Intractable vomiting 05/20/2020   Irregular intermenstrual bleeding 02/19/2019   Mental disorder    Migraine without aura and without status migrainosus, not intractable 02/19/2019   Nausea 05/10/2020   Nausea & vomiting 12/07/2018   Nexplanon  in place 02/19/2019   Obesity 04/17/2017   Panic disorder    Peptic ulcer disease 04/13/2017   Pinched nerve 09/24/2019   Postpartum hypertension 06/23/2018   requiring norvasc , resolved at pp visit   Sciatica    Scoliosis    Tremor of both hands    Trichomoniasis    Vaginal Pap smear, abnormal    Wheezing    Past Surgical History:  Procedure Laterality Date   BACK SURGERY     ruptured disc.   BACK SURGERY  11/2021    BIOPSY  04/14/2017   Procedure: BIOPSY;  Surgeon: Ruby Corporal, MD;  [facility];  Service: Endoscopy;;  gastric   BIOPSY  05/22/2020   Procedure: BIOPSY;  Surgeon: Suzette Espy, MD;  [facility];  Service: Endoscopy;;   BIOPSY  08/29/2020   Procedure: BIOPSY;  Surgeon: Urban Garden, MD;  [facility];  Service: Gastroenterology;;   BIOPSY  09/09/2020   Procedure: BIOPSY;  Surgeon: Vinetta Greening, DO;  [facility];  Service: Endoscopy;;  duodenum;antral   BIOPSY  11/21/2021   Procedure: BIOPSY;  Surgeon: Ruby Corporal, MD;  [facility];  Service: Endoscopy;;   BIOPSY  03/06/2023   Procedure: BIOPSY;  Surgeon: Urban Garden, MD;  [facility];  Service: Gastroenterology;;   CERVICAL ABLATION N/A 12/02/2018   Procedure: LASER ABLATION OF CERVIX;  Surgeon: Wendelyn Halter, MD;  [facility]: [facility];  Service: Gynecology;  Laterality: N/A;   CHOLECYSTECTOMY N/A 04/17/2017   Procedure: LAPAROSCOPIC CHOLECYSTECTOMY;  Surgeon: Alanda Allegra, MD;  Location: [facility];  Service: General;  Laterality: N/A;   COLONOSCOPY WITH PROPOFOL  N/A 08/29/2020   Procedure: COLONOSCOPY WITH PROPOFOL ;  Surgeon: Urban Garden,  MD;  [facility];  Service: Gastroenterology;  Laterality: N/A;  11:15   COLPOSCOPY     ESOPHAGOGASTRODUODENOSCOPY (EGD) WITH PROPOFOL  N/A 04/14/2017   Procedure: ESOPHAGOGASTRODUODENOSCOPY (EGD) WITH PROPOFOL ;  Surgeon: Ruby Corporal, MD;  [facility];  Service: Endoscopy;  Laterality: N/A;   ESOPHAGOGASTRODUODENOSCOPY (EGD) WITH PROPOFOL  N/A 05/22/2020   Procedure: ESOPHAGOGASTRODUODENOSCOPY (EGD) WITH PROPOFOL ;  Surgeon: Suzette Espy, MD;  [facility];  Service: Endoscopy;  Laterality: N/A;   ESOPHAGOGASTRODUODENOSCOPY (EGD) WITH PROPOFOL  N/A 09/09/2020   Procedure: ESOPHAGOGASTRODUODENOSCOPY (EGD) WITH PROPOFOL ;  Surgeon: Vinetta Greening, DO;  [facility];  Service: Endoscopy;  Laterality: N/A;   ESOPHAGOGASTRODUODENOSCOPY (EGD) WITH PROPOFOL  N/A 11/21/2020   Procedure: ESOPHAGOGASTRODUODENOSCOPY (EGD) WITH PROPOFOL ;  Surgeon: Urban Garden, MD;  [facility];  Service: Gastroenterology;  Laterality: N/A;  Am   ESOPHAGOGASTRODUODENOSCOPY (EGD) WITH PROPOFOL  N/A 11/21/2021   Procedure: ESOPHAGOGASTRODUODENOSCOPY (EGD) WITH PROPOFOL ;  Surgeon: Ruby Corporal, MD;  [facility];  Service: Endoscopy;  Laterality: N/A;  820   ESOPHAGOGASTRODUODENOSCOPY (EGD) WITH PROPOFOL  N/A 03/06/2023   Procedure: ESOPHAGOGASTRODUODENOSCOPY (EGD) WITH PROPOFOL ;  Surgeon: Urban Garden, MD;  [facility];  Service: Gastroenterology;  Laterality: N/A;  11:45AM;ASA 2   HERNIA REPAIR Bilateral    inguinal   LASER ABLATION CONDOLAMATA N/A 12/02/2018   Procedure: LASER ABLATION CONDYLOMA ACCUMINATA LEFT AND RIGHT VULVA, PERINEUM AND PERIANAL (15 TOTAL);  Surgeon: Wendelyn Halter, MD;  Location: [facility];  Service: Gynecology;  Laterality: N/A;   LUMBAR LAMINECTOMY/DECOMPRESSION MICRODISCECTOMY Right 12/03/2021   Procedure: MICRODISCECTOMY Lumbar four- five;  Surgeon: Garry Kansas, MD;  Location: [facility];  Service: Neurosurgery;  Laterality: Right;   MOUTH SURGERY  09/2021   All top teeth and all wisdom teeth removed   MOUTH SURGERY  12/2021   POLYPECTOMY  08/29/2020   Procedure: POLYPECTOMY;  Surgeon: Urban Garden, MD;  [facility];  Service: Gastroenterology;;   Patient Active Problem List   Diagnosis Date Noted   Screening examination for STD (sexually transmitted disease) 08/05/2023   Herpes 08/05/2023   Vaginal irritation 08/05/2023   Vaginal discharge 08/05/2023   Urinary incontinence 08/05/2023   Continuous urine leakage 07/15/2023   Essential tremor 05/01/2023   History of migraine headaches 05/01/2023   History of UTI 02/18/2023   Hematuria  02/18/2023   Trichomoniasis 02/04/2023   Substance abuse (HCC) 12/17/2022   Bartholin's gland cyst 12/17/2022   Dysmenorrhea 10/24/2022   Dyspareunia, female 10/24/2022   Pelvic pain 10/24/2022   Helicobacter positive gastritis 05/07/2022   Erosive gastropathy 05/07/2022   Boil 04/12/2022   Positive H. 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) 08/23/2020   Post laminectomy syndrome 08/23/2020   Frequent headaches 08/10/2020   Nexplanon  in place 08/10/2020   Irregular intermenstrual bleeding 08/10/2020   Obesity (BMI 30-39.9) 05/20/2020   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   Panic attack 05/18/2020   Epigastric pain 05/18/2020   Agitated 05/18/2020   Gastroesophageal reflux disease 05/10/2020   Alternating constipation and diarrhea 05/10/2020   Bilateral wheezing 02/22/2020  Sleep disturbance 02/22/2020   Anxiety and depression 02/19/2019   Intractable nausea and vomiting 12/07/2018   Low back pain 12/07/2018   Chronic diarrhea 12/07/2018   Nausea & vomiting 12/07/2018   Condyloma 04/15/2018   Abnormal Pap smear of cervix 11/04/2017   Reflux esophagitis 04/17/2017   Tobacco abuse 04/17/2017   Gastro-esophageal reflux disease with esophagitis 04/17/2017   Peptic ulcer disease 04/13/2017   Erosive gastritis 04/11/2017   Leukocytosis 04/11/2017   Anxiety 04/11/2017    PCP: Tobi Fortes, MD   REFERRING PROVIDER: Garry Kansas, MD  REFERRING DIAG: M54.2 (ICD-10-CM) - Cervicalgia M51.16 (ICD-10-CM) - Herniation of lumbar intervertebral disc with  radiculopathy  Rationale for Evaluation and Treatment: Rehabilitation  THERAPY DIAG:  Other low back pain  Weakness of left lower extremity  Neck pain  ONSET DATE: over 6 months ago  SUBJECTIVE:                                                                                                                                                                                           SUBJECTIVE STATEMENT: Pt states she's doing better overall.  Currently without pain.      PERTINENT HISTORY:  History of two low back surgeries, one a couple of years ago, the other when pt was 16.   PAIN:  Are you having pain? Yes: NPRS scale: 8/10 Pain location: lower cervical spine, lumbar spine, legs ache occasionally Pain description: dull, occasionally sharp spurts (low back) and sharp (neck) Aggravating factors: working around the house, arm movements and lifting for the neck, lifting from floor and standing/sitting for long periods of time for the low back Relieving factors: heat, [place] , laying down flat, keeping arms above head  PRECAUTIONS: None  RED FLAGS: None   WEIGHT BEARING RESTRICTIONS: No  FALLS:  Has patient fallen in last 6 months? Yes. Number of falls 1, just tripped  OCCUPATION: homemaker  PATIENT GOALS: decrease the pain in neck and low back, everyday tasks with decreased breaks.  NEXT MD VISIT: none, waiting to complete therapy  OBJECTIVE:  Note: Objective measures were completed at Evaluation unless otherwise noted.  PATIENT SURVEYS:  Modified Oswestry 20/50  NDI 15/50 Modified Oswestry 19/50 (01/14/24) COGNITION: Overall cognitive status: Within functional limits for tasks assessed     SENSATION: Hands go numb almost everyday    POSTURE: rounded shoulders, forward head, decreased lumbar lordosis, and increased thoracic kyphosis  PALPATION: Tenderness to palpation of paraspinal musculature from C6-T4 and L1-L4. Spinous processes sensitive to touch as well of  the same spinal segments with decreased mobility. CERVICAL ROM:   AROM eval  Flexion 40  Extension 35  Right lateral flexion 45  Left lateral flexion 40, pain  Right rotation 64  Left rotation 44, pain   (Blank rows = not tested) LUMBAR ROM:  AROM eval   Flexion 50, pain 85, little pain  Extension 15, pain 20, little pain  Right lateral flexion 16, pain 20  Left lateral flexion 15, pain 25  Right rotation 75%, pain on left side 100%  Left rotation 100% 100%   (Blank rows = not tested)   LOWER EXTREMITY MMT:    MMT Right eval Left eval Right 01/14/24 Left 01/14/24  Hip flexion 4- 4- 4+ 4+  Hip extension 4- 4- 4+ 4+  Hip abduction 4+ 4- 5 4+  Hip adduction      Hip internal rotation      Hip external rotation      Knee flexion 4+ 4 4+ 4+  Knee extension 4+ 4 4+ 4+  Ankle dorsiflexion 5 5    Ankle plantarflexion      Ankle inversion      Ankle eversion       (Blank rows = not tested)  CERVICAL SPECIAL TESTS:  Spurling's Compression test, negative bilaterally for radicular symptoms, pt reports neck pain during compression of left cervical spine  FUNCTIONAL TESTS:  2 minute walk test: 400 feet   30 second chair stand test: 6.5 reps GAIT: Distance walked: 400 Assistive device utilized: None Level of assistance: Complete Independence Comments: Pt demonstrates abnormally decreased gait speed and demonstrates mild varying of stride lengths especially during making 90 degree turns during .  TREATMENT DATE:  01/21/24 Nustep seat 10 level 2 LE only 5 minutes Prone:  hip extension 10X each  Alt UE/LE 10X each  POE 2 minutes  Press ups 5X, 5X with OP Supine: physioball TA 10X5"  Physioball obliques 10X5" each  Bridge with ball under LE 10X5' Seated:  march on ball 10X with stab  LAQ on ball with stab 10X  01/14/2024  Therapeutic Exercise: -Nustep, 5 minute, level 3 resistance -PPT with march, 1 set of 10 reps, pt cued to remain in pain free ROM -LTR 1 set of  10 reps bilaterally, pt cued to remain in pain free ROM Therapeutic Activity: -Strength, ROM assessments, goal tracking, education on role of PT and progress towards therapy goals -STS, 1 set of 8 reps throughout session   01/07/2024  -MHP, 8 minutes at the start of therapy Manual Therapy: -CPA of Lumbar Spinal segments L3-L5, grade I-II mobilizations -STM of Lumbar Paraspinal musculature  Therapeutic Exercise: -Prone on elbows, 2 sets of 30 second holds -Supine bridges 2 sets of 10 reps, 3 second holds, pt cued for pain free ROM -PPT 2 set of 10 reps, 3 second holds, pt cued to remain in pain free ROM, 2nd set with march -Single LE bent knee fallout, 1 set of 10 reps bilaterally, pt cued to remain in pain free ROM -Seated lumbar extensions, 1 set of 10 reps, 2 second holds, pt cued for slow and controlled movements         PATIENT EDUCATION:  Education details: Pt was educated on findings of PT evaluation, prognosis, frequency of therapy visits and rationale, attendance policy, and HEP if given.   Person educated: Patient Education method: Explanation, Demonstration, Tactile cues, Verbal cues, and Handouts Education comprehension: verbalized understanding, returned demonstration, verbal cues required, and tactile cues required  HOME EXERCISE PROGRAM: Access Code: [email or web address] URL: [email or web address]  Date: 11/28/2023 Prepared by: Armond Bertin Exercises -  Seated Cervical Retraction  - 1 x daily - 7 x weekly - 3 sets - 10 reps - Seated Scapular Retraction  - 1 x daily - 7 x weekly - 3 sets - 10 reps - Supine Posterior Pelvic Tilt  - 1 x daily - 7 x weekly - 3 sets - 10 reps  Date: 01/05/2024 Prepared by: Vickii Grand Exercises - Supine Transversus Abdominis Bracing - Hands on Stomach  - 2 x daily - 7 x weekly - 2 sets - 10 reps - 5 sec hold - Beginner Bridge  - 2 x daily - 7 x weekly - 2 sets - 10 reps - Small Range Straight Leg Raise  - 2 x daily - 7 x weekly -  2 sets - 10 reps - Seated Table Hamstring Stretch  - 2 x daily - 7 x weekly - 3 sets - 3 reps - 30 sec hold - Seated Piriformis Stretch with Trunk Bend  - 2 x daily - 7 x weekly - 2 sets - 3 reps - 30 sec hold - Sit to Stand  - 2 x daily - 7 x weekly - 2 sets - 10 reps   ASSESSMENT:  CLINICAL IMPRESSION: Progressed therex today to include core stabilization using physioball.  Continued with lumbar extension focus as well with good results. Pt without any complaints of pain or issues during session today.  Encouraged to purchase a ball as she reported comfort and good core isolation with use.  Continue to progress towards goals with focus on increased endurance with ambulation and LE strength.    OBJECTIVE IMPAIRMENTS: Abnormal gait, decreased activity tolerance, decreased endurance, decreased mobility, decreased ROM, decreased strength, and pain.   ACTIVITY LIMITATIONS: carrying, lifting, bending, sitting, standing, squatting, stairs, and transfers  PARTICIPATION LIMITATIONS: meal prep, cleaning, laundry, driving, community activity, and yard work  PERSONAL FACTORS: Age and 1-2 comorbidities: Anxiety, Chronic back pain  are also affecting patient's functional outcome.   REHAB POTENTIAL: Good  CLINICAL DECISION MAKING: Stable/uncomplicated  EVALUATION COMPLEXITY: Low   GOALS: Goals reviewed with patient? Yes  SHORT TERM GOALS: Target date: 12/19/23 > 01/28/24  Pt will be independent with HEP in order to demonstrate participation in Physical Therapy POC.  Baseline: Goal status: NOT MET  2.  Pt will report 5/10 pain with mobility in order to demonstrate improved pain with ADLs.  Baseline:  Goal status: MET  LONG TERM GOALS: Target date: 01/09/24 > 02/17/24  Pt will improve LLE strength to at least 4+/5 in order to demonstrate improved functional strength to return to desired activities.  Baseline: See above Goal status: MET  2.  Pt will improve 2 MWT by 140 feet in order to  demonstrate improved functional ambulatory capacity in community setting.  Baseline: 400 feet, 4/23: 428 feet Goal status: NOT MET  3.  Pt will improve Modified Oswestry score to <5 in order to demonstrate improved pain with functional goals and outcomes. Baseline: 15/50 eval, 4/23: 19/50 Goal status: NOT MET  4.  Pt will report 3/10 pain with mobility in order to demonstrate reduced pain with ADLs lasting greater than 30 minutes.  Baseline: 7/10 Goal status: IN PROGRESS   PLAN:  PT FREQUENCY: 2x/week  PT DURATION: 6 weeks  PLANNED INTERVENTIONS: 97110-Therapeutic exercises, 97530- Therapeutic activity, 97112- Neuromuscular re-education, 97535- Self Care, [id number]- Manual therapy, [phone]- Gait training, Balance training, Stair training, Joint mobilization, Joint manipulation, Spinal manipulation, Spinal mobilization, Cryotherapy, and Moist heat.  PLAN FOR NEXT SESSION:Continue PT visits  for 4 weeks, 2 visits per week   Lorenso Romance, PTA/CLT [facility] Out[facility] Rehabilitation [facility] Ph: [phone]  4:17 PM, 01/21/24

## 2024-01-22 ENCOUNTER — Encounter (HOSPITAL_COMMUNITY)

## 2024-01-25 ENCOUNTER — Other Ambulatory Visit: Payer: Self-pay | Admitting: Internal Medicine

## 2024-01-26 ENCOUNTER — Encounter: Payer: Self-pay | Admitting: Internal Medicine

## 2024-01-28 ENCOUNTER — Encounter (HOSPITAL_COMMUNITY): Payer: Self-pay

## 2024-01-28 ENCOUNTER — Ambulatory Visit (HOSPITAL_COMMUNITY): Attending: Neurosurgery

## 2024-01-28 DIAGNOSIS — R29898 Other symptoms and signs involving the musculoskeletal system: Secondary | ICD-10-CM | POA: Diagnosis not present

## 2024-01-28 DIAGNOSIS — M5459 Other low back pain: Secondary | ICD-10-CM | POA: Insufficient documentation

## 2024-01-28 DIAGNOSIS — M542 Cervicalgia: Secondary | ICD-10-CM | POA: Insufficient documentation

## 2024-01-28 NOTE — Therapy (Signed)
 OUTPATIENT PHYSICAL THERAPY THORACOLUMBAR/CERVICAL TREATMENT   Patient Name: Debbie Santana MRN: [id number] DOB:03/25/1991, 33 y.o., female Today's Date: 01/28/2024  END OF SESSION:  PT End of Session - 01/28/24 0802     Visit Number 9    Number of Visits 13    Date for PT Re-Evaluation 02/11/24    Authorization Type  MEDICAID HEALTHY BLUE    Authorization Time Period healthy blue approved 5 visits from 01/21/2024-03/20/2024    Authorization - Visit Number 2    Authorization - Number of Visits 5    Progress Note Due on Visit 12    PT Start Time 0802    PT Stop Time 0843    PT Time Calculation (min) 41 min    Activity Tolerance Patient tolerated treatment well    Behavior During Therapy [facility] for tasks assessed/performed                    Past Medical History:  Diagnosis Date   Anxiety    Bacterial vaginosis    Bradycardia 04/17/2017   Chest pain on breathing 02/22/2020   Chronic back pain    Depression    Erosive gastritis 04/11/2017   Gastric ulcer    Genital warts    GERD (gastroesophageal reflux disease)    High cholesterol    History of gestational hypertension 05/21/2018   Dx intrapartum   History of kidney stones    Intertrigo 11/16/2019   Intractable abdominal pain 04/11/2017   Intractable nausea and vomiting 12/07/2018   Intractable vomiting 05/20/2020   Irregular intermenstrual bleeding 02/19/2019   Mental disorder    Migraine without aura and without status migrainosus, not intractable 02/19/2019   Nausea 05/10/2020   Nausea & vomiting 12/07/2018   Nexplanon  in place 02/19/2019   Obesity 04/17/2017   Panic disorder    Peptic ulcer disease 04/13/2017   Pinched nerve 09/24/2019   Postpartum hypertension 06/23/2018   requiring norvasc , resolved at pp visit   Sciatica    Scoliosis    Tremor of both hands    Trichomoniasis    Vaginal Pap smear, abnormal    Wheezing    Past Surgical History:  Procedure Laterality Date   BACK SURGERY      ruptured disc.   BACK SURGERY  11/2021   BIOPSY  04/14/2017   Procedure: BIOPSY;  Surgeon: Ruby Corporal, MD;  [facility];  Service: Endoscopy;;  gastric   BIOPSY  05/22/2020   Procedure: BIOPSY;  Surgeon: Suzette Espy, MD;  [facility];  Service: Endoscopy;;   BIOPSY  08/29/2020   Procedure: BIOPSY;  Surgeon: Urban Garden, MD;  [facility];  Service: Gastroenterology;;   BIOPSY  09/09/2020   Procedure: BIOPSY;  Surgeon: Vinetta Greening, DO;  [facility];  Service: Endoscopy;;  duodenum;antral   BIOPSY  11/21/2021   Procedure: BIOPSY;  Surgeon: Ruby Corporal, MD;  [facility];  Service: Endoscopy;;   BIOPSY  03/06/2023   Procedure: BIOPSY;  Surgeon: Urban Garden, MD;  [facility];  Service: Gastroenterology;;   CERVICAL ABLATION N/A 12/02/2018   Procedure: LASER ABLATION OF CERVIX;  Surgeon: Wendelyn Halter, MD;  [facility]: [facility];  Service: Gynecology;  Laterality: N/A;   CHOLECYSTECTOMY N/A 04/17/2017   Procedure: LAPAROSCOPIC CHOLECYSTECTOMY;  Surgeon: Alanda Allegra, MD;  Location: [facility];  Service: General;  Laterality: N/A;   COLONOSCOPY WITH PROPOFOL  N/A 08/29/2020  Procedure: COLONOSCOPY WITH PROPOFOL ;  Surgeon: Urban Garden, MD;  [facility];  Service: Gastroenterology;  Laterality: N/A;  11:15   COLPOSCOPY     ESOPHAGOGASTRODUODENOSCOPY (EGD) WITH PROPOFOL  N/A 04/14/2017   Procedure: ESOPHAGOGASTRODUODENOSCOPY (EGD) WITH PROPOFOL ;  Surgeon: Ruby Corporal, MD;  [facility];  Service: Endoscopy;  Laterality: N/A;   ESOPHAGOGASTRODUODENOSCOPY (EGD) WITH PROPOFOL  N/A 05/22/2020   Procedure: ESOPHAGOGASTRODUODENOSCOPY (EGD) WITH PROPOFOL ;  Surgeon: Suzette Espy, MD;  [facility];  Service: Endoscopy;  Laterality: N/A;   ESOPHAGOGASTRODUODENOSCOPY (EGD) WITH PROPOFOL  N/A 09/09/2020   Procedure: ESOPHAGOGASTRODUODENOSCOPY  (EGD) WITH PROPOFOL ;  Surgeon: Vinetta Greening, DO;  [facility];  Service: Endoscopy;  Laterality: N/A;   ESOPHAGOGASTRODUODENOSCOPY (EGD) WITH PROPOFOL  N/A 11/21/2020   Procedure: ESOPHAGOGASTRODUODENOSCOPY (EGD) WITH PROPOFOL ;  Surgeon: Urban Garden, MD;  [facility];  Service: Gastroenterology;  Laterality: N/A;  Am   ESOPHAGOGASTRODUODENOSCOPY (EGD) WITH PROPOFOL  N/A 11/21/2021   Procedure: ESOPHAGOGASTRODUODENOSCOPY (EGD) WITH PROPOFOL ;  Surgeon: Ruby Corporal, MD;  [facility];  Service: Endoscopy;  Laterality: N/A;  820   ESOPHAGOGASTRODUODENOSCOPY (EGD) WITH PROPOFOL  N/A 03/06/2023   Procedure: ESOPHAGOGASTRODUODENOSCOPY (EGD) WITH PROPOFOL ;  Surgeon: Urban Garden, MD;  [facility];  Service: Gastroenterology;  Laterality: N/A;  11:45AM;ASA 2   HERNIA REPAIR Bilateral    inguinal   LASER ABLATION CONDOLAMATA N/A 12/02/2018   Procedure: LASER ABLATION CONDYLOMA ACCUMINATA LEFT AND RIGHT VULVA, PERINEUM AND PERIANAL (15 TOTAL);  Surgeon: Wendelyn Halter, MD;  Location: [facility];  Service: Gynecology;  Laterality: N/A;   LUMBAR LAMINECTOMY/DECOMPRESSION MICRODISCECTOMY Right 12/03/2021   Procedure: MICRODISCECTOMY Lumbar four- five;  Surgeon: Garry Kansas, MD;  Location: [facility];  Service: Neurosurgery;  Laterality: Right;   MOUTH SURGERY  09/2021   All top teeth and all wisdom teeth removed   MOUTH SURGERY  12/2021   POLYPECTOMY  08/29/2020   Procedure: POLYPECTOMY;  Surgeon: Urban Garden, MD;  [facility];  Service: Gastroenterology;;   Patient Active Problem List   Diagnosis Date Noted   Screening examination for STD (sexually transmitted disease) 08/05/2023   Herpes 08/05/2023   Vaginal irritation 08/05/2023   Vaginal discharge 08/05/2023   Urinary incontinence 08/05/2023   Continuous urine leakage 07/15/2023   Essential tremor 05/01/2023   History of migraine headaches  05/01/2023   History of UTI 02/18/2023   Hematuria 02/18/2023   Trichomoniasis 02/04/2023   Substance abuse (HCC) 12/17/2022   Bartholin's gland cyst 12/17/2022   Dysmenorrhea 10/24/2022   Dyspareunia, female 10/24/2022   Pelvic pain 10/24/2022   Helicobacter positive gastritis 05/07/2022   Erosive gastropathy 05/07/2022   Boil 04/12/2022   Positive H. 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) 08/23/2020   Post laminectomy syndrome 08/23/2020   Frequent headaches 08/10/2020   Nexplanon  in place 08/10/2020   Irregular intermenstrual bleeding 08/10/2020   Obesity (BMI 30-39.9) 05/20/2020   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   Panic attack 05/18/2020   Epigastric pain 05/18/2020   Agitated 05/18/2020   Gastroesophageal reflux disease 05/10/2020   Alternating  constipation and diarrhea 05/10/2020   Bilateral wheezing 02/22/2020   Sleep disturbance 02/22/2020   Anxiety and depression 02/19/2019   Intractable nausea and vomiting 12/07/2018   Low back pain 12/07/2018   Chronic diarrhea 12/07/2018   Nausea & vomiting 12/07/2018   Condyloma 04/15/2018   Abnormal Pap smear of cervix 11/04/2017   Reflux esophagitis 04/17/2017   Tobacco abuse 04/17/2017   Gastro-esophageal reflux disease with esophagitis 04/17/2017   Peptic ulcer disease 04/13/2017   Erosive gastritis 04/11/2017   Leukocytosis 04/11/2017   Anxiety 04/11/2017    PCP: Tobi Fortes, MD   REFERRING PROVIDER: Garry Kansas, MD  REFERRING DIAG: M54.2 (ICD-10-CM) - Cervicalgia M51.16  (ICD-10-CM) - Herniation of lumbar intervertebral disc with radiculopathy  Rationale for Evaluation and Treatment: Rehabilitation  THERAPY DIAG:  Other low back pain  Weakness of left lower extremity  Neck pain  ONSET DATE: over 6 months ago  SUBJECTIVE:                                                                                                                                                                                           SUBJECTIVE STATEMENT: No pain currently.  Reports pain usually following standing for 2 house and housework like mopping and picking up 34 year old.  Reports compliance with HEP around 2 times a week.  Has not purchased theraball yet but is interested in it.       PERTINENT HISTORY:  History of two low back surgeries, one a couple of years ago, the other when pt was 16.   PAIN:  Are you having pain? Yes: NPRS scale: 8/10 Pain location: lower cervical spine, lumbar spine, legs ache occasionally Pain description: dull, occasionally sharp spurts (low back) and sharp (neck) Aggravating factors: working around the house, arm movements and lifting for the neck, lifting from floor and standing/sitting for long periods of time for the low back Relieving factors: heat, [place] , laying down flat, keeping arms above head  PRECAUTIONS: None  RED FLAGS: None   WEIGHT BEARING RESTRICTIONS: No  FALLS:  Has patient fallen in last 6 months? Yes. Number of falls 1, just tripped  OCCUPATION: homemaker  PATIENT GOALS: decrease the pain in neck and low back, everyday tasks with decreased breaks.  NEXT MD VISIT: none, waiting to complete therapy  OBJECTIVE:  Note: Objective measures were completed at Evaluation unless otherwise noted.  PATIENT SURVEYS:  Modified Oswestry 20/50  NDI 15/50 Modified Oswestry 19/50 (01/14/24) COGNITION: Overall cognitive status: Within functional limits for tasks assessed     SENSATION: Hands go numb almost  everyday    POSTURE: rounded shoulders,  forward head, decreased lumbar lordosis, and increased thoracic kyphosis  PALPATION: Tenderness to palpation of paraspinal musculature from C6-T4 and L1-L4. Spinous processes sensitive to touch as well of the same spinal segments with decreased mobility. CERVICAL ROM:   AROM eval  Flexion 40  Extension 35  Right lateral flexion 45  Left lateral flexion 40, pain  Right rotation 64  Left rotation 44, pain   (Blank rows = not tested) LUMBAR ROM:  AROM eval   Flexion 50, pain 85, little pain  Extension 15, pain 20, little pain  Right lateral flexion 16, pain 20  Left lateral flexion 15, pain 25  Right rotation 75%, pain on left side 100%  Left rotation 100% 100%   (Blank rows = not tested)   LOWER EXTREMITY MMT:    MMT Right eval Left eval Right 01/14/24 Left 01/14/24  Hip flexion 4- 4- 4+ 4+  Hip extension 4- 4- 4+ 4+  Hip abduction 4+ 4- 5 4+  Hip adduction      Hip internal rotation      Hip external rotation      Knee flexion 4+ 4 4+ 4+  Knee extension 4+ 4 4+ 4+  Ankle dorsiflexion 5 5    Ankle plantarflexion      Ankle inversion      Ankle eversion       (Blank rows = not tested)  CERVICAL SPECIAL TESTS:  Spurling's Compression test, negative bilaterally for radicular symptoms, pt reports neck pain during compression of left cervical spine  FUNCTIONAL TESTS:  2 minute walk test: 400 feet   30 second chair stand test: 6.5 reps GAIT: Distance walked: 400 Assistive device utilized: None Level of assistance: Complete Independence Comments: Pt demonstrates abnormally decreased gait speed and demonstrates mild varying of stride lengths especially during making 90 degree turns during .  TREATMENT DATE:  01/28/24: Nustep [place] UE and LE 5'   Standing: 3D hip excursion: Squat, extension, rotation, side bend Reports of increased pain following sidebend and points to PSIS Seated: Assessed SI alignment with Lt  outflare with seated anterior/posterior pelvic tilt Supine: MET followed by 2 min walk with ab set (418ft) Supine:  Isometric adduction with ball squeeze then abd into belt 10x 5" physioball TA 10X5"  Physioball obliques 10X5" each  Bridge with BTB around thighs 1st set ball under LE 2nd set Prone: Alt UE/LE with pillow under hips  01/21/24 Nustep seat 10 level 2 LE only 5 minutes Prone:  hip extension 10X each  Alt UE/LE 10X each  POE 2 minutes  Press ups 5X, 5X with OP Supine: physioball TA 10X5"  Physioball obliques 10X5" each  Bridge with ball under LE 10X5' Seated:  march on ball 10X with stab  LAQ on ball with stab 10X  01/14/2024  Therapeutic Exercise: -Nustep, 5 minute, level 3 resistance -PPT with march, 1 set of 10 reps, pt cued to remain in pain free ROM -LTR 1 set of 10 reps bilaterally, pt cued to remain in pain free ROM Therapeutic Activity: -Strength, ROM assessments, goal tracking, education on role of PT and progress towards therapy goals -STS, 1 set of 8 reps throughout session   01/07/2024  -MHP, 8 minutes at the start of therapy Manual Therapy: -CPA of Lumbar Spinal segments L3-L5, grade I-II mobilizations -STM of Lumbar Paraspinal musculature  Therapeutic Exercise: -Prone on elbows, 2 sets of 30 second holds -Supine bridges 2 sets of 10 reps, 3 second holds, pt cued for pain free  ROM -PPT 2 set of 10 reps, 3 second holds, pt cued to remain in pain free ROM, 2nd set with march -Single LE bent knee fallout, 1 set of 10 reps bilaterally, pt cued to remain in pain free ROM -Seated lumbar extensions, 1 set of 10 reps, 2 second holds, pt cued for slow and controlled movements         PATIENT EDUCATION:  Education details: Pt was educated on findings of PT evaluation, prognosis, frequency of therapy visits and rationale, attendance policy, and HEP if given.   Person educated: Patient Education method: Explanation, Demonstration, Tactile cues, Verbal  cues, and Handouts Education comprehension: verbalized understanding, returned demonstration, verbal cues required, and tactile cues required  HOME EXERCISE PROGRAM: Access Code: [email or web address] URL: [email or web address]  Date: 11/28/2023 Prepared by: Armond Bertin Exercises - Seated Cervical Retraction  - 1 x daily - 7 x weekly - 3 sets - 10 reps - Seated Scapular Retraction  - 1 x daily - 7 x weekly - 3 sets - 10 reps - Supine Posterior Pelvic Tilt  - 1 x daily - 7 x weekly - 3 sets - 10 reps  Date: 01/05/2024 Prepared by: Vickii Grand Exercises - Supine Transversus Abdominis Bracing - Hands on Stomach  - 2 x daily - 7 x weekly - 2 sets - 10 reps - 5 sec hold - Beginner Bridge  - 2 x daily - 7 x weekly - 2 sets - 10 reps - Small Range Straight Leg Raise  - 2 x daily - 7 x weekly - 2 sets - 10 reps - Seated Table Hamstring Stretch  - 2 x daily - 7 x weekly - 3 sets - 3 reps - 30 sec hold - Seated Piriformis Stretch with Trunk Bend  - 2 x daily - 7 x weekly - 2 sets - 3 reps - 30 sec hold - Sit to Stand  - 2 x daily - 7 x weekly - 2 sets - 10 reps  01/28/24: - Hooklying Isometric Hip Abduction Adduction with Belt and Ball  - 1 x daily - 7 x weekly - 2 sets - 10 reps - 5" hold   ASSESSMENT:  CLINICAL IMPRESSION: Added 3D excursion for mobility with squats for functional strengthening with some cueing for proper form and mechanics. Reports of low pain pointing to PSIS following sidebending.  Therapist checked SI alignment with noted outflare.  MET complete with ability to complete pelvic movements pain free and with improve core activation.  Added pubic clearing to HEP with handout given.  Encouraged to complete core strengthening exercises daily to assist with SI alignment.   OBJECTIVE IMPAIRMENTS: Abnormal gait, decreased activity tolerance, decreased endurance, decreased mobility, decreased ROM, decreased strength, and pain.   ACTIVITY LIMITATIONS: carrying, lifting,  bending, sitting, standing, squatting, stairs, and transfers  PARTICIPATION LIMITATIONS: meal prep, cleaning, laundry, driving, community activity, and yard work  PERSONAL FACTORS: Age and 1-2 comorbidities: Anxiety, Chronic back pain  are also affecting patient's functional outcome.   REHAB POTENTIAL: Good  CLINICAL DECISION MAKING: Stable/uncomplicated  EVALUATION COMPLEXITY: Low   GOALS: Goals reviewed with patient? Yes  SHORT TERM GOALS: Target date: 12/19/23 > 01/28/24  Pt will be independent with HEP in order to demonstrate participation in Physical Therapy POC.  Baseline: Goal status: NOT MET  2.  Pt will report 5/10 pain with mobility in order to demonstrate improved pain with ADLs.  Baseline:  Goal status: MET  LONG TERM GOALS: Target date: 01/09/24 >  02/17/24  Pt will improve LLE strength to at least 4+/5 in order to demonstrate improved functional strength to return to desired activities.  Baseline: See above Goal status: MET  2.  Pt will improve 2 MWT by 140 feet in order to demonstrate improved functional ambulatory capacity in community setting.  Baseline: 400 feet, 4/23: 428 feet Goal status: NOT MET  3.  Pt will improve Modified Oswestry score to <5 in order to demonstrate improved pain with functional goals and outcomes. Baseline: 15/50 eval, 4/23: 19/50 Goal status: NOT MET  4.  Pt will report 3/10 pain with mobility in order to demonstrate reduced pain with ADLs lasting greater than 30 minutes.  Baseline: 7/10 Goal status: IN PROGRESS   PLAN:  PT FREQUENCY: 2x/week  PT DURATION: 6 weeks  PLANNED INTERVENTIONS: 97110-Therapeutic exercises, 97530- Therapeutic activity, 97112- Neuromuscular re-education, 97535- Self Care, 16109- Manual therapy, [phone]- Gait training, Balance training, Stair training, Joint mobilization, Joint manipulation, Spinal manipulation, Spinal mobilization, Cryotherapy, and Moist heat.  PLAN FOR NEXT SESSION:Check SI, progress  core and proximal strengthening.  Minor Amble, LPTA/CLT; CBIS [phone]   9:57 AM, 01/28/24

## 2024-01-30 ENCOUNTER — Encounter (HOSPITAL_COMMUNITY)

## 2024-02-05 ENCOUNTER — Other Ambulatory Visit: Payer: Self-pay | Admitting: Internal Medicine

## 2024-02-05 MED ORDER — BUTALBITAL-APAP-CAFFEINE 50-325-40 MG PO TABS: 1.0000 | ORAL_TABLET | Freq: Two times a day (BID) | ORAL | 0 refills | Status: DC | PRN

## 2024-02-09 ENCOUNTER — Encounter: Attending: Physical Medicine and Rehabilitation | Admitting: Physical Medicine and Rehabilitation

## 2024-02-09 VITALS — BP 110/72 | Ht 68.0 in | Wt 185.2 lb

## 2024-02-09 DIAGNOSIS — F1910 Other psychoactive substance abuse, uncomplicated: Secondary | ICD-10-CM | POA: Insufficient documentation

## 2024-02-09 DIAGNOSIS — M5416 Radiculopathy, lumbar region: Secondary | ICD-10-CM | POA: Insufficient documentation

## 2024-02-09 DIAGNOSIS — F1211 Cannabis abuse, in remission: Secondary | ICD-10-CM | POA: Diagnosis present

## 2024-02-09 DIAGNOSIS — G8929 Other chronic pain: Secondary | ICD-10-CM | POA: Insufficient documentation

## 2024-02-09 DIAGNOSIS — F419 Anxiety disorder, unspecified: Secondary | ICD-10-CM | POA: Diagnosis not present

## 2024-02-09 DIAGNOSIS — G894 Chronic pain syndrome: Secondary | ICD-10-CM | POA: Diagnosis not present

## 2024-02-09 DIAGNOSIS — F32A Depression, unspecified: Secondary | ICD-10-CM | POA: Insufficient documentation

## 2024-02-09 MED ORDER — DULOXETINE HCL 30 MG PO CPEP: ORAL_CAPSULE | ORAL | 3 refills | Status: DC

## 2024-02-09 MED ORDER — SERTRALINE HCL 100 MG PO TABS: ORAL_TABLET | ORAL | 0 refills | Status: DC

## 2024-02-09 NOTE — Progress Notes (Unsigned)
 Subjective:    Patient ID: Debbie Santana, female    DOB: [birth year], 33 y.o.   MRN: [id number]  HPI HPI  Debbie Santana is a 33 y.o. year old female  who  has a past medical history of Anxiety, Bacterial vaginosis, Bradycardia (04/17/2017), Chest pain on breathing (02/22/2020), Chronic back pain, Depression, Erosive gastritis (04/11/2017), Gastric ulcer, Genital warts, GERD (gastroesophageal reflux disease), High cholesterol, History of gestational hypertension (05/21/2018), History of kidney stones, Intertrigo (11/16/2019), Intractable abdominal pain (04/11/2017), Intractable nausea and vomiting (12/07/2018), Intractable vomiting (05/20/2020), Irregular intermenstrual bleeding (02/19/2019), Mental disorder, Migraine without aura and without status migrainosus, not intractable (02/19/2019), Nausea (05/10/2020), Nausea & vomiting (12/07/2018), Nexplanon  in place (02/19/2019), Obesity (04/17/2017), Panic disorder, Peptic ulcer disease (04/13/2017), Pinched nerve (09/24/2019), Postpartum hypertension (06/23/2018), Sciatica, Scoliosis, Tremor of both hands, Trichomoniasis, Vaginal Pap smear, abnormal, and Wheezing.   They are presenting to PM&R clinic as a new patient for pain management evaluation. They were referred by Dr. Kermit Ped for treatment of back pain.   Source: Low back/SI joint bilaterally with occassional radiation into her anterior thighs.  Occassional L hand numbness on digits 3-5.   Inciting incident: None; at age 33 had discectomy and had a herniated disc. Dr. Larrie Po is talking to her about a fusion in her neck.   Description of pain: intermittent and aching Exacerbating factors: Lifting, mopping, housework, standing, sitting for long periods; worse when she's been active. Nighttime and in the AM are "bad".  Remitting factors: Laying flat on her back or on her side with a pillow between her legs; HEP helps.   Red flag symptoms: No red flags for back pain endorsed in Hx or ROS, new  weakness, new numbness/tingling, and pain waking up at nighttime.  Medications tried: Topical medications (mild effect) : "I've tried a lot"; uses capsaicin  and lidocaine  patches, biofreeze.  Helps some.  Nsaids () : Can't sue to stomach ulcers.  Tylenol   (moderate effect) : Takes midol  2 tabs twice daily;  Opiates  (excellent effect) : She was on oxycodone  10 mg 4 times daily chronically; was stopped after failing a UDS and [facility] declined to take over script.    Gabapentin  / Lyrica   (moderate effect) : Gabapentin  300 mg BID; helps with her leg pains. Was on Lyrica  in the past but didn't work as well TCAs  (never tried) :  SNRIs  (never tried) :  Other  (mild effect) : Prednisone  taper, robaxin  750 mg PRN uses rarely.   Other treatments: PT/OT  (moderate effect) : Currently in PT, doing HEP 2x weekly. Concentrating on low back. About to finish a 10 week course.  Accupuncture/chiropractor/massage  (never tried) :  TENs unit (no effect) : Tried in PT without benefit Injections (no effect) : Last had around age 33; unsure what type of injection, was one in the low back, unsure of side. Afterwards her pain was increased indefinitely.  Surgery (moderate effect) : Discectomy at 42 with Dr. Wyvonna Heidelberg with benefit; currently discussing ?cervical fusion with Dr. Larrie Po  Other  () : None  Goals for pain control: "Walk more, be able to stand longer, not be wiped out. Even getting my toenails done I am I too much pain, I can't sit down afterwards." Wants to hike.   She is not employed, has not yet filed for disability. She has a 35 year old son who is healthy, she lives with her parents and is too scared to drive so she is dependent on her  parents. She sleeps "some", wakes in the middle of the night without cause usually around 3-4 Am, in the afternoon she gets very exhausted.    Prior UDS results: Is prescribed clonazepam , Norco 5 mg 01/12/24, fioricet; UDS done in ED 01/13/23 for abdominal pain and  back pain.   She smoked 2 packs a day for 20 years; occassional alcohol  use 1 drink every 2-3 weeks;  currently using delta 9 daily.      Component Value Date/Time   LABOPIA POSITIVE (A) 01/13/2023 1353   COCAINSCRNUR NONE DETECTED 01/13/2023 1353   LABBENZ POSITIVE (A) 01/13/2023 1353   AMPHETMU POSITIVE (A) 01/13/2023 1353   THCU POSITIVE (A) 01/13/2023 1353   LABBARB NONE DETECTED 01/13/2023 1353     Of note, was referred to [facility] 2024 for pain management, offered injections but declined RX opiates due to marijuana use.   Pain Inventory Average Pain 8 Pain Right Now 5 My pain is constant, sharp, stabbing, tingling, and aching  In the last 24 hours, has pain interfered with the following? General activity 8 Relation with others 6 Enjoyment of life 8 What TIME of day is your pain at its worst? morning , daytime, evening, and night Sleep (in general) Poor  Pain is worse with: walking, bending, sitting, standing, and some activites Pain improves with: therapy/exercise, pacing activities, and medication Relief from Meds: 10  walk without assistance  not employed: date last employed 2023  weakness numbness tremor tingling spasms dizziness confusion depression anxiety  Any changes since last visit?  no  Any changes since last visit?  no    Family History  Problem Relation Age of Onset   Anxiety disorder Mother    Hyperlipidemia Mother    Crohn's disease Sister    Diabetes Maternal Grandfather    Diabetes Cousin    Learning disabilities Cousin    Social History   Socioeconomic History   Marital status: Single    Spouse name: Not on file   Number of children: 1   Years of education: Not on file   Highest education level: Not on file  Occupational History   Not on file  Tobacco Use   Smoking status: Every Day    Current packs/day: 1.00    Average packs/day: 1 pack/day for 10.0 years (10.0 ttl pk-yrs)    Types: Cigarettes    Passive exposure:  Current   Smokeless tobacco: Never  Vaping Use   Vaping status: Former  Substance and Sexual Activity   Alcohol  use: Not Currently   Drug use: Not Currently    Comment: delta 9- quit   Sexual activity: Not Currently    Birth control/protection: Implant  Other Topics Concern   Not on file  Social History Narrative   Lives with son-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      3 dogs and 3 cats      Enjoy: crafts      Diet: eats all food groups -acidic foods   Caffeine : stopped in last week or so   Water : 6-8 cups daily       Wears seat belt   Does not drive   Smoke detectors at home   Fire extinguisher    No Weapons    Social Drivers of Health   Financial Resource Strain: Low Risk  (06/13/2022)   Overall Financial Resource Strain (CARDIA)    Difficulty of Paying Living Expenses: Not very hard  Food Insecurity: Food  Insecurity Present (06/13/2022)   Hunger Vital Sign    Worried About Running Out of Food in the Last Year: Sometimes true    Ran Out of Food in the Last Year: Sometimes true  Transportation Needs: No Transportation Needs (06/13/2022)   PRAPARE - Administrator, Civil Service (Medical): No    Lack of Transportation (Non-Medical): No  Physical Activity: Insufficiently Active (06/13/2022)   Exercise Vital Sign    Days of Exercise per Week: 2 days    Minutes of Exercise per Session: 50 min  Stress: Stress Concern Present (06/13/2022)   [employer] of Occupational Health - Occupational Stress Questionnaire    Feeling of Stress : Rather much  Social Connections: Socially Isolated (06/13/2022)   Social Connection and Isolation Panel [NHANES]    Frequency of Communication with Friends and Family: Once a week    Frequency of Social Gatherings with Friends and Family: Never    Attends Religious Services: Never    Database administrator or Organizations: No    Attends Banker Meetings: Never    Marital Status:  Never married   Past Surgical History:  Procedure Laterality Date   BACK SURGERY     ruptured disc.   BACK SURGERY  11/2021   BIOPSY  04/14/2017   Procedure: BIOPSY;  Surgeon: Ruby Corporal, MD;  [facility];  Service: Endoscopy;;  gastric   BIOPSY  05/22/2020   Procedure: BIOPSY;  Surgeon: Suzette Espy, MD;  [facility];  Service: Endoscopy;;   BIOPSY  08/29/2020   Procedure: BIOPSY;  Surgeon: Urban Garden, MD;  [facility];  Service: Gastroenterology;;   BIOPSY  09/09/2020   Procedure: BIOPSY;  Surgeon: Vinetta Greening, DO;  [facility];  Service: Endoscopy;;  duodenum;antral   BIOPSY  11/21/2021   Procedure: BIOPSY;  Surgeon: Ruby Corporal, MD;  [facility];  Service: Endoscopy;;   BIOPSY  03/06/2023   Procedure: BIOPSY;  Surgeon: Urban Garden, MD;  [facility];  Service: Gastroenterology;;   CERVICAL ABLATION N/A 12/02/2018   Procedure: LASER ABLATION OF CERVIX;  Surgeon: Wendelyn Halter, MD;  [facility]: [facility];  Service: Gynecology;  Laterality: N/A;   CHOLECYSTECTOMY N/A 04/17/2017   Procedure: LAPAROSCOPIC CHOLECYSTECTOMY;  Surgeon: Alanda Allegra, MD;  Location: [facility];  Service: General;  Laterality: N/A;   COLONOSCOPY WITH PROPOFOL  N/A 08/29/2020   Procedure: COLONOSCOPY WITH PROPOFOL ;  Surgeon: Urban Garden, MD;  [facility];  Service: Gastroenterology;  Laterality: N/A;  11:15   COLPOSCOPY     ESOPHAGOGASTRODUODENOSCOPY (EGD) WITH PROPOFOL  N/A 04/14/2017   Procedure: ESOPHAGOGASTRODUODENOSCOPY (EGD) WITH PROPOFOL ;  Surgeon: Ruby Corporal, MD;  [facility];  Service: Endoscopy;  Laterality: N/A;   ESOPHAGOGASTRODUODENOSCOPY (EGD) WITH PROPOFOL  N/A 05/22/2020   Procedure: ESOPHAGOGASTRODUODENOSCOPY (EGD) WITH PROPOFOL ;  Surgeon: Suzette Espy, MD;  [facility];  Service: Endoscopy;  Laterality: N/A;    ESOPHAGOGASTRODUODENOSCOPY (EGD) WITH PROPOFOL  N/A 09/09/2020   Procedure: ESOPHAGOGASTRODUODENOSCOPY (EGD) WITH PROPOFOL ;  Surgeon: Vinetta Greening, DO;  [facility];  Service: Endoscopy;  Laterality: N/A;   ESOPHAGOGASTRODUODENOSCOPY (EGD) WITH PROPOFOL  N/A 11/21/2020   Procedure: ESOPHAGOGASTRODUODENOSCOPY (EGD) WITH PROPOFOL ;  Surgeon: Urban Garden, MD;  [facility];  Service: Gastroenterology;  Laterality: N/A;  Am   ESOPHAGOGASTRODUODENOSCOPY (EGD) WITH PROPOFOL  N/A 11/21/2021   Procedure: ESOPHAGOGASTRODUODENOSCOPY (EGD) WITH PROPOFOL ;  Surgeon:  Ruby Corporal, MD;  [facility];  Service: Endoscopy;  Laterality: N/A;  820   ESOPHAGOGASTRODUODENOSCOPY (EGD) WITH PROPOFOL  N/A 03/06/2023   Procedure: ESOPHAGOGASTRODUODENOSCOPY (EGD) WITH PROPOFOL ;  Surgeon: Urban Garden, MD;  [facility];  Service: Gastroenterology;  Laterality: N/A;  11:45AM;ASA 2   HERNIA REPAIR Bilateral    inguinal   LASER ABLATION CONDOLAMATA N/A 12/02/2018   Procedure: LASER ABLATION CONDYLOMA ACCUMINATA LEFT AND RIGHT VULVA, PERINEUM AND PERIANAL (15 TOTAL);  Surgeon: Wendelyn Halter, MD;  Location: [facility];  Service: Gynecology;  Laterality: N/A;   LUMBAR LAMINECTOMY/DECOMPRESSION MICRODISCECTOMY Right 12/03/2021   Procedure: MICRODISCECTOMY Lumbar four- five;  Surgeon: Garry Kansas, MD;  Location[facility];  Service: Neurosurgery;  Laterality: Right;   MOUTH SURGERY  09/2021   All top teeth and all wisdom teeth removed   MOUTH SURGERY  12/2021   POLYPECTOMY  08/29/2020   Procedure: POLYPECTOMY;  Surgeon: Umberto Ganong, Bearl Limes, MD;  [facility];  Service: Gastroenterology;;   Past Medical History:  Diagnosis Date   Anxiety    Bacterial vaginosis    Bradycardia 04/17/2017   Chest pain on breathing 02/22/2020   Chronic back pain    Depression    Erosive gastritis 04/11/2017   Gastric ulcer    Genital warts    GERD  (gastroesophageal reflux disease)    High cholesterol    History of gestational hypertension 05/21/2018   Dx intrapartum   History of kidney stones    Intertrigo 11/16/2019   Intractable abdominal pain 04/11/2017   Intractable nausea and vomiting 12/07/2018   Intractable vomiting 05/20/2020   Irregular intermenstrual bleeding 02/19/2019   Mental disorder    Migraine without aura and without status migrainosus, not intractable 02/19/2019   Nausea 05/10/2020   Nausea & vomiting 12/07/2018   Nexplanon  in place 02/19/2019   Obesity 04/17/2017   Panic disorder    Peptic ulcer disease 04/13/2017   Pinched nerve 09/24/2019   Postpartum hypertension 06/23/2018   requiring norvasc , resolved at pp visit   Sciatica    Scoliosis    Tremor of both hands    Trichomoniasis    Vaginal Pap smear, abnormal    Wheezing    BP 110/72 (BP Location: Left Arm, Patient Position: Sitting)   Ht 5\' 8"  (1.727 m)   Wt 185 lb 3.2 oz (84 kg)   SpO2 95%   BMI 28.16 kg/m   Opioid Risk Score:   Fall Risk Score:  `1  Depression screen PHQ 2/9     01/05/2024    4:30 PM 05/01/2023    9:05 AM 06/13/2022    2:50 PM 09/05/2021   10:42 AM 10/31/2020    1:37 PM 09/14/2020   10:05 AM 08/15/2020    9:27 AM  Depression screen PHQ 2/9  Decreased Interest 3 3 3 1 2  0 1  Down, Depressed, Hopeless 3 3 3 2 3  0 1  PHQ - 2 Score 6 6 6 3 5  0 2  Altered sleeping 3 3 2 3 2  0 1  Tired, decreased energy 3 3 3 3 1 1 1   Change in appetite 3 3 3 2 3 1 1   Feeling bad or failure about yourself  3 3 3 3 3  0 0  Trouble concentrating 3 3 3 3 3 1 1   Moving slowly or fidgety/restless 3 0 0 0 0 0 0  Suicidal thoughts 0 0 0  0 0 0  PHQ-9 Score 24  21 20 17 17 3 6   Difficult doing work/chores Somewhat difficult     Not difficult at all Somewhat difficult     Review of Systems  Musculoskeletal:  Positive for back pain.  Psychiatric/Behavioral:  Positive for dysphoric mood.   All other systems reviewed and are negative.       Objective:   Physical Exam   PE: Constitution: Appropriate appearance for age. No apparent distress *** +Obese Resp: No respiratory distress. No accessory muscle usage. {Blank multiple:19196::"***","on RA","CTAB","on *** L Opp"} Cardio: Well perfused appearance. *** No peripheral edema. Abdomen: Nondistended. Nontender.   Psych: Appropriate mood and affect. Neuro: AAOx4. No apparent cognitive deficits   Neurologic Exam:   DTRs: Reflexes were 2+ in bilateral achilles, patella, biceps, BR and triceps. Babinsky: flexor responses b/l.   Hoffmans: negative b/l Sensory exam: revealed normal sensation in all dermatomal regions in {Blank multiple:19196::"***","bilateral upper extremities","bilateral lower extremities","right upper extremity","left upper extremity","right lower extremity","left lower extremity","with reduced sensation to light touch in ***"} Motor exam: strength 5/5 throughout {Blank multiple:19196::"***","bilateral upper extremities","bilateral lower extremities","right upper extremity","left upper extremity","right lower extremity","left lower extremity","with exception of ***"} Coordination: Fine motor coordination was normal.   Gait: {Blank single:19197::"not observed due to safety concerns","normal","+trendelenberg","+antalgic gait","+foot drop","***"}         Assessment & Plan:

## 2024-02-09 NOTE — Patient Instructions (Addendum)
 Start tapering sertraline  to 150 mg at night for 1 week, then 100 mg for 1 week, then 50 mg for 1 week, then stop. Once stopped, start Duloxetine  30 mg daily for 7 days, then increase to 60 mg daily. Message me through ychart if there is an issue with this.   Fill out a records request for Dr. Larrie Po office  Stop Delta 8/9 gummies. In 1 month, return for urine drug screen. If negative, we will consider low dose Butran patch 5 mcg, but have no gaurantee of starting controlled substances. We will not escalate to oxycodone  or hydrocodone .   I ordered a TENS unit for chronic localized back pain.   Follow up in 1 month

## 2024-02-10 ENCOUNTER — Other Ambulatory Visit (INDEPENDENT_AMBULATORY_CARE_PROVIDER_SITE_OTHER): Payer: Self-pay | Admitting: Gastroenterology

## 2024-02-10 DIAGNOSIS — G8929 Other chronic pain: Secondary | ICD-10-CM

## 2024-02-10 DIAGNOSIS — R112 Nausea with vomiting, unspecified: Secondary | ICD-10-CM

## 2024-02-10 DIAGNOSIS — K279 Peptic ulcer, site unspecified, unspecified as acute or chronic, without hemorrhage or perforation: Secondary | ICD-10-CM

## 2024-02-10 DIAGNOSIS — K2960 Other gastritis without bleeding: Secondary | ICD-10-CM

## 2024-02-10 NOTE — Telephone Encounter (Signed)
 I sent patient a my chart message letting her know she will need an office visit for further refills as she has not been seen since 02/04/2023.
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
## 2024-02-11 ENCOUNTER — Ambulatory Visit: Payer: Medicaid Other | Admitting: Adult Health

## 2024-02-11 ENCOUNTER — Encounter: Payer: Self-pay | Admitting: Adult Health

## 2024-02-11 VITALS — BP 116/72 | HR 79 | Ht 68.0 in | Wt 183.0 lb

## 2024-02-11 DIAGNOSIS — R251 Tremor, unspecified: Secondary | ICD-10-CM

## 2024-02-11 DIAGNOSIS — G250 Essential tremor: Secondary | ICD-10-CM

## 2024-02-11 MED ORDER — PRIMIDONE 50 MG PO TABS: 25.0000 mg | ORAL_TABLET | Freq: Every day | ORAL | 11 refills | Status: DC

## 2024-02-11 NOTE — Patient Instructions (Addendum)
 Your Plan:  Start primidone 25mg  nightly - do this for 2 weeks and if no benefit, please let me know and we can increase dose. Please call sooner with any difficulty tolerating      Please follow up in 4-6 months with MD     Thank you for coming to see us  at [facility]. I hope we have been able to provide you high quality care today.  You may receive a patient satisfaction survey over the next few weeks. We would appreciate your feedback and comments so that we may continue to improve ourselves and the health of our patients.    Essential Tremor A tremor is trembling or shaking that a person cannot control. Most tremors affect the hands or arms. Tremors can also affect the head, vocal cords, legs, and other parts of the body. Essential tremor is a tremor without a known cause. Usually, it occurs while a person is trying to perform an action. It tends to get worse gradually as a person ages. What are the causes? The cause of this condition is not known, but it often runs in families. What increases the risk? You are more likely to develop this condition if: You have a family member with essential tremor. You are 16 years of age or older. What are the signs or symptoms? The main sign of a tremor is a rhythmic shaking of certain parts of your body that is uncontrolled and unintentional. You may: Have difficulty eating with a spoon or fork. Have difficulty writing. Nod your head up and down or side to side. Have a quivering voice. The shaking may: Get worse over time. Come and go. Be more noticeable on one side of your body. Get worse due to stress, tiredness (fatigue), caffeine , and extreme heat or cold. How is this diagnosed? This condition may be diagnosed based on: Your symptoms and medical history. A physical exam. There is no single test to diagnose an essential tremor. However, your health care provider may order tests to rule out other causes of your  condition. These may include: Blood and urine tests. Imaging studies of your brain, such as a CT scan or MRI. How is this treated? Treatment for essential tremor depends on the severity of the condition. Mild tremors may not need treatment if they do not affect your day-to-day life. Severe tremors may need to be treated using one or more of the following options: Medicines. Injections of a substance called botulinum toxin. Procedures such as deep brain stimulation (DBS) implantation or MRI-guided ultrasound treatment. Lifestyle changes. Occupational or physical therapy. Follow these instructions at home: Lifestyle  Do not use any products that contain nicotine  or tobacco. These products include cigarettes, chewing tobacco, and vaping devices, such as e-cigarettes. If you need help quitting, ask your health care provider. Limit your caffeine  intake as told by your health care provider. Try to get 8 hours of sleep each night. Find ways to manage your stress that fit your lifestyle and personality. Consider trying meditation or yoga. Try to anticipate stressful situations and allow extra time to manage them. If you are struggling emotionally with the effects of your tremor, consider working with a mental health provider. General instructions Take over-the-counter and prescription medicines only as told by your health care provider. Avoid extreme heat and extreme cold. Keep all follow-up visits. This is important. Visits may include physical therapy visits. Where to find more information [employer] of Neurological Disorders and Stroke: [employer] Contact a  health care provider if: You experience any changes in the location or intensity of your tremors. You start having a tremor after starting a new medicine. You have a tremor with other symptoms, such as: Numbness. Tingling. Pain. Weakness. Your tremor gets worse. Your tremor interferes with your daily life. You feel  down, blue, or sad for at least 2 weeks in a row. Worrying about your tremor and what other people think about you interferes with your everyday life functions, including relationships, work, or school. Summary Essential tremor is a tremor without a known cause. Usually, it occurs when you are trying to perform an action. You are more likely to develop this condition if you have a family member with essential tremor. The main sign of a tremor is a rhythmic shaking of certain parts of your body that is uncontrolled and unintentional. Treatment for essential tremor depends on the severity of the condition. This information is not intended to replace advice given to you by your health care provider. Make sure you discuss any questions you have with your health care provider. Document Revised: 06/29/2021 Document Reviewed: 06/29/2021 Elsevier Patient Education  2024 Elsevier Inc.     Primidone Tablets What is this medication? PRIMIDONE (PRI mi done) prevents and controls seizures in people with epilepsy. It works by calming overactive nerves in your body. This medicine may be used for other purposes; ask your health care provider or pharmacist if you have questions. COMMON BRAND NAME(S): Mysoline What should I tell my care team before I take this medication? They need to know if you have any of these conditions: Kidney disease Liver disease Porphyria Suicidal thoughts, plans, or attempt by you or a family member An unusual or allergic reaction to primidone, phenobarbital, other barbiturates or seizure medications, other medications, foods, dyes, or preservatives Pregnant or trying to get pregnant Breast-feeding How should I use this medication? Take this medication by mouth with a glass of water . Follow the directions on the prescription label. Take your doses at regular intervals. Do not take your medication more often than directed. Do not stop taking except on the advice of your care  team. A special MedGuide will be given to you by the pharmacist with each prescription and refill. Be sure to read this information carefully each time. Contact your care team about the use of this medication in children. Special care may be needed. While this medication may be prescribed for children for selected conditions, precautions do apply. Overdosage: If you think you have taken too much of this medicine contact a poison control center or emergency room at once. NOTE: This medicine is only for you. Do not share this medicine with others. What if I miss a dose? If you miss a dose, take it as soon as you can. If it is almost time for your next dose, take only that dose. Do not take double or extra doses. What may interact with this medication? Do not take this medication with any of the following: Voriconazole This medication may also interact with the following: Antivirals for HIV or AIDS Cyclosporine Disopyramide Doxycycline  Estrogen and progestin hormones Medications for cancer Medications for depression, anxiety, or other mental health conditions Modafinil Prescription pain medications Quinidine Warfarin This list may not describe all possible interactions. Give your health care provider a list of all the medicines, herbs, non-prescription drugs, or dietary supplements you use. Also tell them if you smoke, drink alcohol , or use illegal drugs. Some items may interact with your medicine.  What should I watch for while using this medication? Visit your care team for regular checks on your progress. It may be 2 to 3 weeks before you see the full effects of this medication. Do not suddenly stop taking this medication. You may increase the risk of seizures. Your care team will tell you how much medication to take. If your care team wants you to stop the medication, the dose may be slowly lowered over time. Wear a medical ID bracelet or chain to say you have epilepsy. Carry a card that  describes your condition. List the medications and doses you take on the card. This medication may affect your coordination, reaction time, or judgment. Do not drive or operate machinery until you know how this medication affects you. Sit up or stand slowly to reduce the risk of dizzy or fainting spells. Drinking alcohol  with this medication can increase the risk of these side effects. Estrogen and progestin hormones may not work as well while you are taking this medication. Your care team can help you find the contraceptive option that works for you. This medication may cause thoughts of suicide or depression. This includes sudden changes in mood, behaviors, or thoughts. These changes can happen at any time but are more common in the beginning of treatment or after a change in dose. Call your care team right away if you experience these thoughts or worsening depression. People who become pregnant while using this medication may enroll in the [place] American Antiepileptic Drug Pregnancy Registry by calling 3234322440. This registry collects information about the safety of antiepileptic medication use during pregnancy. This medication may cause a decrease in vitamin D  and folic acid . You should make sure that you get enough vitamins while you are taking this medication. Discuss the foods you eat and the vitamins you take with your care team. What side effects may I notice from receiving this medication? Side effects that you should report to your care team as soon as possible: Allergic reactions--skin rash, itching, hives, swelling of the face, lips, tongue, or throat CNS depression--slow or shallow breathing, shortness of breath, feeling faint, dizziness, confusion, trouble staying awake Thoughts of suicide or self-harm, worsening mood, or feelings of depression Side effects that usually do not require medical attention (report to your care team if they continue or are  bothersome): Dizziness Drowsiness Loss of balance or coordination Nausea This list may not describe all possible side effects. Call your doctor for medical advice about side effects. You may report side effects to FDA at 1-800-FDA-1088. Where should I keep my medication? Keep out of the reach of children and pets. This medication may cause accidental overdose and death if it is taken by other adults, children, or pets. Mix any unused medication with a substance like cat litter or coffee grounds. Then throw the medication away in a sealed container like a sealed bag or a coffee can with a lid. Do not use the medication after the expiration date. Store at room temperature between 15 and 30 degrees C (59 and 86 degrees F). NOTE: This sheet is a summary. It may not cover all possible information. If you have questions about this medicine, talk to your doctor, pharmacist, or health care provider.  2024 Elsevier/Gold Standard (2023-03-29 00:00:00)

## 2024-02-15 ENCOUNTER — Encounter: Payer: Self-pay | Admitting: Neurology

## 2024-02-15 ENCOUNTER — Encounter: Payer: Self-pay | Admitting: Internal Medicine

## 2024-02-17 MED ORDER — TRAZODONE HCL 50 MG PO TABS: 100.0000 mg | ORAL_TABLET | Freq: Every day | ORAL | 2 refills | Status: DC

## 2024-02-17 NOTE — Telephone Encounter (Signed)
 Spoke w/Pt who stated still having tremors in both hands on the primidone 50 mg 0.5 tab at bedtime. Pt is also continuing to ween off sertraline . Informed Pt will send message to provider regarding the issue and will reach out to her if any further recommendations. Pt voiced understanding.

## 2024-02-18 MED ORDER — PRIMIDONE 50 MG PO TABS: 50.0000 mg | ORAL_TABLET | Freq: Every day | ORAL | 11 refills | Status: DC

## 2024-02-18 NOTE — Addendum Note (Signed)
 Addended by: Johny Nap L on: 02/18/2024 09:12 AM   Modules accepted: Orders

## 2024-02-19 ENCOUNTER — Encounter: Payer: Self-pay | Admitting: Internal Medicine

## 2024-02-22 HISTORY — PX: MOUTH SURGERY: SHX715

## 2024-02-26 ENCOUNTER — Other Ambulatory Visit: Payer: Self-pay | Admitting: Internal Medicine

## 2024-02-27 ENCOUNTER — Encounter (HOSPITAL_COMMUNITY): Payer: Self-pay

## 2024-02-27 ENCOUNTER — Encounter: Payer: Self-pay | Admitting: Internal Medicine

## 2024-02-27 ENCOUNTER — Encounter (HOSPITAL_COMMUNITY)

## 2024-02-27 DIAGNOSIS — G894 Chronic pain syndrome: Secondary | ICD-10-CM

## 2024-02-27 DIAGNOSIS — G8929 Other chronic pain: Secondary | ICD-10-CM

## 2024-02-27 NOTE — Therapy (Signed)
 [facility] at [facility] [address] [place], [place], [address] Phone: [phone]   Fax:  [phone]  Patient Details  Name: Debbie Santana MRN: [id number] Date of Birth: Apr 21, 1991 Referring Provider:  No ref. provider found  Encounter Date: 02/27/2024  PHYSICAL THERAPY DISCHARGE SUMMARY  Visits from Start of Care: 8  Current functional level related to goals / functional outcomes: Progressing   Remaining deficits: See last treatment note.   Education / Equipment: Pt educated on the plan of care and importance of HEP compliance.   Patient agrees to discharge. Patient goals were partially met. Patient is being discharged due to being pleased with the current functional level.    Pt was called concerning her missed appointment this morning at 8 AM. Pt states her and her mom forgot all about it. Pt was educated that this was her last scheduled appointment and if she wished to reschedule. Pt states she feels like she is good without returning to therapy. Pt states her back pain is still about a 4/10 pain but is happy with current level of functioning. Pt to be discharged from this episode of PT.   Armond Bertin, PT, DPT [facility] Office: [phone] 9:53 AM, 02/27/24   [facility] at [facility] [address] [place], [place], [address] Phone: [phone]   Fax:  [phone]

## 2024-03-01 ENCOUNTER — Other Ambulatory Visit: Payer: Self-pay | Admitting: Internal Medicine

## 2024-03-04 ENCOUNTER — Encounter: Payer: Self-pay | Admitting: Adult Health

## 2024-03-04 ENCOUNTER — Other Ambulatory Visit (HOSPITAL_COMMUNITY)
Admission: RE | Admit: 2024-03-04 | Discharge: 2024-03-04 | Disposition: A | Discharge status: Home / Self Care | Source: Ambulatory Visit | Attending: Adult Health | Admitting: Adult Health

## 2024-03-04 ENCOUNTER — Ambulatory Visit: Admitting: Adult Health

## 2024-03-04 VITALS — BP 124/76 | HR 76 | Ht 68.0 in | Wt 180.5 lb

## 2024-03-04 DIAGNOSIS — N898 Other specified noninflammatory disorders of vagina: Secondary | ICD-10-CM | POA: Diagnosis not present

## 2024-03-04 DIAGNOSIS — Z8619 Personal history of other infectious and parasitic diseases: Secondary | ICD-10-CM | POA: Diagnosis not present

## 2024-03-04 DIAGNOSIS — Z113 Encounter for screening for infections with a predominantly sexual mode of transmission: Secondary | ICD-10-CM | POA: Insufficient documentation

## 2024-03-04 DIAGNOSIS — R300 Dysuria: Secondary | ICD-10-CM

## 2024-03-04 LAB — POCT URINALYSIS DIPSTICK
Blood, UA: NEGATIVE
Glucose, UA: NEGATIVE
Ketones, UA: NEGATIVE
Leukocytes, UA: NEGATIVE
Nitrite, UA: NEGATIVE
Protein, UA: NEGATIVE

## 2024-03-04 MED ORDER — METRONIDAZOLE 500 MG PO TABS: 500.0000 mg | ORAL_TABLET | Freq: Two times a day (BID) | ORAL | 0 refills | Status: DC
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
## 2024-03-05 MED ORDER — GABAPENTIN 300 MG PO CAPS: 300.0000 mg | ORAL_CAPSULE | Freq: Two times a day (BID) | ORAL | 1 refills | Status: DC

## 2024-03-08 LAB — CERVICOVAGINAL ANCILLARY ONLY
Bacterial Vaginitis (gardnerella): POSITIVE — AB
Candida Glabrata: NEGATIVE
Candida Vaginitis: NEGATIVE
Chlamydia: NEGATIVE
Comment: NEGATIVE
Comment: NEGATIVE
Comment: NEGATIVE
Comment: NEGATIVE
Comment: NEGATIVE
Comment: NORMAL
Neisseria Gonorrhea: NEGATIVE
Trichomonas: NEGATIVE

## 2024-03-09 ENCOUNTER — Ambulatory Visit: Payer: Self-pay | Admitting: Adult Health

## 2024-03-09 ENCOUNTER — Encounter: Payer: Self-pay | Admitting: Internal Medicine

## 2024-03-09 MED ORDER — BUTALBITAL-APAP-CAFFEINE 50-325-40 MG PO TABS: 1.0000 | ORAL_TABLET | Freq: Two times a day (BID) | ORAL | 0 refills | Status: DC | PRN

## 2024-03-10 ENCOUNTER — Other Ambulatory Visit: Payer: Self-pay | Admitting: Women's Health

## 2024-03-10 MED ORDER — FLUCONAZOLE 150 MG PO TABS: 150.0000 mg | ORAL_TABLET | Freq: Once | ORAL | 0 refills | Status: AC

## 2024-03-11 ENCOUNTER — Other Ambulatory Visit: Payer: Self-pay

## 2024-03-11 ENCOUNTER — Emergency Department (HOSPITAL_COMMUNITY)
Admission: EM | Admit: 2024-03-11 | Discharge: 2024-03-11 | Disposition: A | Discharge status: Home / Self Care | Attending: Emergency Medicine | Admitting: Emergency Medicine

## 2024-03-11 ENCOUNTER — Encounter (HOSPITAL_COMMUNITY): Payer: Self-pay

## 2024-03-11 DIAGNOSIS — R1110 Vomiting, unspecified: Secondary | ICD-10-CM | POA: Diagnosis present

## 2024-03-11 DIAGNOSIS — R110 Nausea: Secondary | ICD-10-CM | POA: Diagnosis not present

## 2024-03-11 DIAGNOSIS — R1013 Epigastric pain: Secondary | ICD-10-CM | POA: Insufficient documentation

## 2024-03-11 DIAGNOSIS — M545 Low back pain, unspecified: Secondary | ICD-10-CM | POA: Diagnosis not present

## 2024-03-11 DIAGNOSIS — R112 Nausea with vomiting, unspecified: Secondary | ICD-10-CM

## 2024-03-11 DIAGNOSIS — R197 Diarrhea, unspecified: Secondary | ICD-10-CM | POA: Diagnosis not present

## 2024-03-11 DIAGNOSIS — R1115 Cyclical vomiting syndrome unrelated to migraine: Secondary | ICD-10-CM | POA: Diagnosis not present

## 2024-03-11 DIAGNOSIS — R1084 Generalized abdominal pain: Secondary | ICD-10-CM | POA: Diagnosis not present

## 2024-03-11 DIAGNOSIS — R0902 Hypoxemia: Secondary | ICD-10-CM | POA: Diagnosis not present

## 2024-03-11 LAB — URINALYSIS, ROUTINE W REFLEX MICROSCOPIC
Bacteria, UA: NONE SEEN
Bilirubin Urine: NEGATIVE
Glucose, UA: NEGATIVE mg/dL
Hgb urine dipstick: NEGATIVE
Ketones, ur: NEGATIVE mg/dL
Leukocytes,Ua: NEGATIVE
Nitrite: NEGATIVE
Protein, ur: 30 mg/dL — AB
Specific Gravity, Urine: 1.026 (ref 1.005–1.030)
pH: 5 (ref 5.0–8.0)

## 2024-03-11 LAB — COMPREHENSIVE METABOLIC PANEL WITH GFR
ALT: 20 U/L (ref 0–44)
AST: 25 U/L (ref 15–41)
Albumin: 3.6 g/dL (ref 3.5–5.0)
Alkaline Phosphatase: 53 U/L (ref 38–126)
Anion gap: 7 (ref 5–15)
BUN: 9 mg/dL (ref 6–20)
CO2: 22 mmol/L (ref 22–32)
Calcium: 8.6 mg/dL — ABNORMAL LOW (ref 8.9–10.3)
Chloride: 107 mmol/L (ref 98–111)
Creatinine, Ser: 0.73 mg/dL (ref 0.44–1.00)
GFR, Estimated: >60 mL/min (ref >60)
Glucose, Bld: 134 mg/dL — ABNORMAL HIGH (ref 70–99)
Potassium: 3.5 mmol/L (ref 3.5–5.1)
Sodium: 136 mmol/L (ref 135–145)
Total Bilirubin: 0.5 mg/dL (ref 0.0–1.2)
Total Protein: 6.6 g/dL (ref 6.5–8.1)

## 2024-03-11 LAB — CBC WITH DIFFERENTIAL/PLATELET
Abs Immature Granulocytes: 0.02 10*3/uL (ref 0.00–0.07)
Basophils Absolute: 0 10*3/uL (ref 0.0–0.1)
Basophils Relative: 0 %
Eosinophils Absolute: 0 10*3/uL (ref 0.0–0.5)
Eosinophils Relative: 0 %
HCT: 39.8 % (ref 36.0–46.0)
Hemoglobin: 14 g/dL (ref 12.0–15.0)
Immature Granulocytes: 0 %
Lymphocytes Relative: 12 %
Lymphs Abs: 0.8 10*3/uL (ref 0.7–4.0)
MCH: 33.8 pg (ref 26.0–34.0)
MCHC: 35.2 g/dL (ref 30.0–36.0)
MCV: 96.1 fL (ref 80.0–100.0)
Monocytes Absolute: 0.4 10*3/uL (ref 0.1–1.0)
Monocytes Relative: 6 %
Neutro Abs: 5.5 10*3/uL (ref 1.7–7.7)
Neutrophils Relative %: 82 %
Platelets: 151 10*3/uL (ref 150–400)
RBC: 4.14 MIL/uL (ref 3.87–5.11)
RDW: 12.5 % (ref 11.5–15.5)
WBC: 6.7 10*3/uL (ref 4.0–10.5)
nRBC: 0 % (ref 0.0–0.2)

## 2024-03-11 LAB — LIPASE, BLOOD: Lipase: 25 U/L (ref 11–51)

## 2024-03-11 LAB — HCG, QUANTITATIVE, PREGNANCY: hCG, Beta Chain, Quant, S: <1 m[IU]/mL (ref <5)

## 2024-03-11 MED ORDER — PANTOPRAZOLE SODIUM 40 MG IV SOLR
40.0000 mg | Freq: Once | INTRAVENOUS | Status: AC
  Administered 2024-03-11: 40 mg via INTRAVENOUS
  Filled 2024-03-11: qty 10

## 2024-03-11 MED ORDER — ACETAMINOPHEN 500 MG PO TABS: 1000.0000 mg | ORAL_TABLET | Freq: Once | ORAL | Status: DC

## 2024-03-11 MED ORDER — LACTATED RINGERS IV BOLUS
1000.0000 mL | Freq: Once | INTRAVENOUS | Status: AC
  Administered 2024-03-11: 1000 mL via INTRAVENOUS

## 2024-03-11 MED ORDER — DROPERIDOL 2.5 MG/ML IJ SOLN
2.5000 mg | Freq: Once | INTRAMUSCULAR | Status: AC
  Administered 2024-03-11: 2.5 mg via INTRAVENOUS
  Filled 2024-03-11: qty 2

## 2024-03-11 NOTE — ED Provider Notes (Signed)
  Physical Exam  BP (!) 125/97   Pulse 62   Temp 98.1 F (36.7 C)   Resp 18   LMP 02/12/2024 (Approximate)   SpO2 95%   Physical Exam Vitals and nursing note reviewed.  Constitutional:      General: She is not in acute distress.    Appearance: She is well-developed.  HENT:     Head: Normocephalic and atraumatic.   Eyes:     Conjunctiva/sclera: Conjunctivae normal.    Cardiovascular:     Rate and Rhythm: Normal rate and regular rhythm.     Heart sounds: No murmur heard. Pulmonary:     Effort: Pulmonary effort is normal. No respiratory distress.     Breath sounds: Normal breath sounds.  Abdominal:     Palpations: Abdomen is soft.     Tenderness: There is no abdominal tenderness.   Musculoskeletal:        General: No swelling.     Cervical back: Neck supple.   Skin:    General: Skin is warm and dry.     Capillary Refill: Capillary refill takes less than 2 seconds.   Neurological:     Mental Status: She is alert.   Psychiatric:        Mood and Affect: Mood normal.     Procedures  Procedures  ED Course / MDM    Medical Decision Making Amount and/or Complexity of Data Reviewed Labs: ordered.  Risk OTC drugs. Prescription drug management.   Patient received in handoff.  Suspected cyclic vomiting pending reevaluation after droperidol .  On reevaluation, patient's symptoms have improved.  She is able to tolerate p.o. without difficulty.  We had an extended discussion about her marijuana use and I did encourage her to stop using this as this is likely exacerbating her symptoms.  Patient discharged       Karlyn Overman, MD 03/11/24 1120

## 2024-03-11 NOTE — ED Notes (Signed)
Pt eating crackers

## 2024-03-11 NOTE — ED Triage Notes (Addendum)
 Pt c/o abdominal pain and emesis. Currently taking antibiotics for yeast and tooth infection.   Given 4 of zofran  in route

## 2024-03-11 NOTE — ED Provider Notes (Signed)
 [place] EMERGENCY DEPARTMENT AT [facility] Provider Note   CSN: [id number] Arrival date & time: 03/11/24  9478     Patient presents with: Emesis   Debbie Santana is a 33 y.o. female.   33 yo F here with epigastric pain and emesis. Seen here often for similar. Started a couple hours ago. Got zofran  with EMS which reportedly did not help. States she ate some shrimp last night but doesn't think it was bad. Has had episodes of diarrhea as well. No fevers. No sick contacts, recent travels or other suspicious food intake. LMP was may 26 and irregular.    Emesis      Prior to Admission medications   Medication Sig Start Date End Date Taking? Authorizing Provider  Acetaminophen -Caff-Pyrilamine (MIDOL  COMPLETE) 500-60-15 MG TABS Take 2 tablets by mouth 3 (three) times daily as needed (pain.).    [provider]  butalbital -acetaminophen -caffeine  (FIORICET) 50-325-40 MG tablet Take 1 tablet by mouth every 12 (twelve) hours as needed for migraine. 03/09/24   Melvenia Manus BRAVO, MD  clonazePAM  (KLONOPIN ) 1 MG tablet TAKE 1 TABLET BY MOUTH 4 TIMES DAILY AS NEEDED FOR ANXIETY 02/28/24   Melvenia Manus BRAVO, MD  diphenhydrAMINE  (BENADRYL ) 25 MG tablet Take 25 mg by mouth every 6 (six) hours as needed for allergies.    [provider]  DULoxetine  (CYMBALTA ) 30 MG capsule Take 1 capsule (30 mg total) by mouth daily for 7 days, THEN 2 capsules (60 mg total) daily for 23 days. Do not start until finished with sertraline  taper. 02/09/24 03/10/24  Emeline Joesph BROCKS, DO  etonogestrel  (NEXPLANON ) 68 MG IMPL implant 68 mg by Subdermal route once.    [provider]  gabapentin  (NEURONTIN ) 300 MG capsule Take 1 capsule (300 mg total) by mouth 2 (two) times daily. 03/05/24   Melvenia Manus BRAVO, MD  methocarbamol  (ROBAXIN -750) 750 MG tablet Take 1 tablet (750 mg total) by mouth every 8 (eight) hours as needed for muscle spasms. 01/05/24   Bevely Doffing, FNP  metroNIDAZOLE  (FLAGYL ) 500  MG tablet Take 1 tablet (500 mg total) by mouth 2 (two) times daily. 03/04/24   Signa Delon LABOR, NP  ondansetron  (ZOFRAN ) 4 MG tablet Take 1 tablet (4 mg total) by mouth every 8 (eight) hours as needed for nausea or vomiting. 12/12/23   Towana Ozell BROCKS, MD  primidone  (MYSOLINE ) 50 MG tablet Take 1 tablet (50 mg total) by mouth at bedtime. 02/18/24   Whitfield Raisin, NP  promethazine  (PHENERGAN ) 12.5 MG suppository INSERT 1 SUPPOSITORY RECTALLY EVERY 8 HOURS AS NEEDED FOR NAUSEA FOR VOMITING 02/10/24   Carlan, Chelsea L, NP  sertraline  (ZOLOFT ) 100 MG tablet Take 1.5 tablets (150 mg total) by mouth daily for 7 days, THEN 1 tablet (100 mg total) daily for 7 days, THEN 0.5 tablets (50 mg total) daily for 7 days. Start tapering sertraline  over 3 weeks as instructed; after 3 weeks, stop sertraline  and start Duloxetine  as prescribed. Do not take together.. 02/09/24 03/01/24  Emeline Joesph BROCKS, DO  solifenacin  (VESICARE ) 10 MG tablet Take 1 tablet (10 mg total) by mouth daily. 07/15/23   Signa Delon LABOR, NP  SPIRIVA RESPIMAT 2.5 MCG/ACT AERS Inhale 2 each into the lungs daily as needed (respiratory issues.). 09/24/22   [provider]  sucralfate  (CARAFATE ) 1 g tablet Take 1 tablet (1 g total) by mouth 3 (three) times daily before meals. 11/06/21   Carlan, Chelsea L, NP  traZODone  (DESYREL ) 50 MG tablet Take 2  tablets (100 mg total) by mouth at bedtime. 02/17/24   Melvenia Manus BRAVO, MD  valACYclovir  (VALTREX ) 1000 MG tablet Take 1 tablet (1,000 mg total) by mouth 2 (two) times daily. 08/05/23   Signa Delon LABOR, NP  VRAYLAR  1.5 MG capsule Take 1.5 mg by mouth every evening. 09/27/22   [provider]    Allergies: Tape, Buspirone , Fluoxetine, and Nsaids    Review of Systems  Gastrointestinal:  Positive for vomiting.    Updated Vital Signs BP (!) 139/92 (BP Location: Left Arm)   Pulse 76   Temp 98.2 F (36.8 C) (Oral)   Resp 18   LMP 02/12/2024 (Approximate)   SpO2 92%   Physical  Exam Vitals and nursing note reviewed.  Constitutional:      Appearance: She is well-developed.  HENT:     Head: Normocephalic and atraumatic.   Cardiovascular:     Rate and Rhythm: Normal rate and regular rhythm.  Pulmonary:     Effort: No respiratory distress.     Breath sounds: No stridor.  Abdominal:     General: There is no distension.     Comments: Soft nontender   Musculoskeletal:     Cervical back: Normal range of motion.   Neurological:     Mental Status: She is alert.     (all labs ordered are listed, but only abnormal results are displayed) Labs Reviewed  CBC WITH DIFFERENTIAL/PLATELET  COMPREHENSIVE METABOLIC PANEL WITH GFR  LIPASE, BLOOD  URINALYSIS, ROUTINE W REFLEX MICROSCOPIC  HCG, QUANTITATIVE, PREGNANCY    EKG: None  Radiology: No results found.   Procedures   Medications Ordered in the ED  lactated ringers  bolus 1,000 mL (has no administration in time range)  droperidol  (INAPSINE ) 2.5 MG/ML injection 2.5 mg (has no administration in time range)  pantoprazole  (PROTONIX ) injection 40 mg (has no administration in time range)                                    Medical Decision Making Amount and/or Complexity of Data Reviewed Labs: ordered.  Risk Prescription drug management.  Patient requesting 'strong' pain meds and when not offered stated that she didn't come at 0300 for shitty care. She is not showing objective findings of pain, HR, BP, RR all WNL. Will treat emesis and possible gastritis. If not improving or abnormal findings on labs will consider imaging as indicated.  Resting comfortably. No emesis since being here. VS WNL. Labs as resulted are reassuring, pending UA. Will continue to monitor.  Care transferred pending reevaluation and completion of workup for disposition.     Final diagnoses:  None    ED Discharge Orders     None          Ovide Dusek, Selinda, MD 03/29/24 [phone]

## 2024-03-11 NOTE — ED Notes (Signed)
Lemon lime drink given to pt

## 2024-03-22 ENCOUNTER — Encounter: Attending: Physical Medicine and Rehabilitation | Admitting: Physical Medicine and Rehabilitation

## 2024-03-22 DIAGNOSIS — F1910 Other psychoactive substance abuse, uncomplicated: Secondary | ICD-10-CM | POA: Insufficient documentation

## 2024-03-22 DIAGNOSIS — G8929 Other chronic pain: Secondary | ICD-10-CM | POA: Insufficient documentation

## 2024-03-22 DIAGNOSIS — F1211 Cannabis abuse, in remission: Secondary | ICD-10-CM | POA: Insufficient documentation

## 2024-03-22 DIAGNOSIS — G894 Chronic pain syndrome: Secondary | ICD-10-CM | POA: Insufficient documentation

## 2024-03-22 DIAGNOSIS — M5416 Radiculopathy, lumbar region: Secondary | ICD-10-CM | POA: Insufficient documentation

## 2024-03-22 DIAGNOSIS — F32A Depression, unspecified: Secondary | ICD-10-CM | POA: Insufficient documentation

## 2024-03-22 DIAGNOSIS — F419 Anxiety disorder, unspecified: Secondary | ICD-10-CM | POA: Insufficient documentation

## 2024-03-24 ENCOUNTER — Encounter: Payer: Self-pay | Admitting: Internal Medicine

## 2024-03-28 ENCOUNTER — Other Ambulatory Visit: Payer: Self-pay | Admitting: Internal Medicine

## 2024-03-28 ENCOUNTER — Other Ambulatory Visit: Payer: Self-pay

## 2024-03-28 DIAGNOSIS — M961 Postlaminectomy syndrome, not elsewhere classified: Secondary | ICD-10-CM

## 2024-03-28 DIAGNOSIS — G250 Essential tremor: Secondary | ICD-10-CM

## 2024-03-28 MED ORDER — CLONAZEPAM 1 MG PO TABS: 1.0000 mg | ORAL_TABLET | Freq: Four times a day (QID) | ORAL | 0 refills | Status: DC | PRN

## 2024-03-30 DIAGNOSIS — M4722 Other spondylosis with radiculopathy, cervical region: Secondary | ICD-10-CM | POA: Diagnosis not present

## 2024-03-30 DIAGNOSIS — M542 Cervicalgia: Secondary | ICD-10-CM | POA: Diagnosis not present

## 2024-03-31 ENCOUNTER — Other Ambulatory Visit (HOSPITAL_COMMUNITY): Payer: Self-pay | Admitting: Neurosurgery

## 2024-03-31 DIAGNOSIS — M4722 Other spondylosis with radiculopathy, cervical region: Secondary | ICD-10-CM

## 2024-04-03 ENCOUNTER — Ambulatory Visit (HOSPITAL_COMMUNITY)
Admission: RE | Admit: 2024-04-03 | Discharge: 2024-04-03 | Disposition: A | Discharge status: Home / Self Care | Source: Ambulatory Visit | Attending: Neurosurgery | Admitting: Neurosurgery

## 2024-04-03 DIAGNOSIS — M4802 Spinal stenosis, cervical region: Secondary | ICD-10-CM | POA: Diagnosis not present

## 2024-04-03 DIAGNOSIS — M4722 Other spondylosis with radiculopathy, cervical region: Secondary | ICD-10-CM | POA: Diagnosis not present

## 2024-04-03 DIAGNOSIS — M5031 Other cervical disc degeneration,  high cervical region: Secondary | ICD-10-CM | POA: Diagnosis not present

## 2024-04-03 DIAGNOSIS — M47812 Spondylosis without myelopathy or radiculopathy, cervical region: Secondary | ICD-10-CM | POA: Diagnosis not present

## 2024-04-04 ENCOUNTER — Encounter: Payer: Self-pay | Admitting: Internal Medicine

## 2024-04-05 ENCOUNTER — Other Ambulatory Visit: Payer: Self-pay

## 2024-04-05 DIAGNOSIS — G894 Chronic pain syndrome: Secondary | ICD-10-CM

## 2024-04-05 DIAGNOSIS — G8929 Other chronic pain: Secondary | ICD-10-CM

## 2024-04-05 DIAGNOSIS — R519 Headache, unspecified: Secondary | ICD-10-CM

## 2024-04-05 MED ORDER — BUTALBITAL-APAP-CAFFEINE 50-325-40 MG PO TABS
1.0000 | ORAL_TABLET | Freq: Two times a day (BID) | ORAL | 0 refills | Status: DC | PRN
Start: 2024-04-05 — End: 2024-05-05

## 2024-04-05 MED ORDER — GABAPENTIN 300 MG PO CAPS: 300.0000 mg | ORAL_CAPSULE | Freq: Two times a day (BID) | ORAL | 1 refills | Status: DC

## 2024-04-06 ENCOUNTER — Encounter: Payer: Self-pay | Admitting: Adult Health

## 2024-04-06 ENCOUNTER — Ambulatory Visit: Admitting: Adult Health

## 2024-04-06 ENCOUNTER — Other Ambulatory Visit (HOSPITAL_COMMUNITY)
Admission: RE | Admit: 2024-04-06 | Discharge: 2024-04-06 | Disposition: A | Discharge status: Home / Self Care | Source: Ambulatory Visit | Attending: Adult Health | Admitting: Adult Health

## 2024-04-06 VITALS — BP 120/81 | HR 80 | Ht 68.0 in | Wt 183.0 lb

## 2024-04-06 DIAGNOSIS — R300 Dysuria: Secondary | ICD-10-CM | POA: Diagnosis not present

## 2024-04-06 DIAGNOSIS — R102 Pelvic and perineal pain: Secondary | ICD-10-CM | POA: Diagnosis not present

## 2024-04-06 DIAGNOSIS — L0292 Furuncle, unspecified: Secondary | ICD-10-CM | POA: Diagnosis not present

## 2024-04-06 DIAGNOSIS — R309 Painful micturition, unspecified: Secondary | ICD-10-CM | POA: Diagnosis not present

## 2024-04-06 DIAGNOSIS — Z113 Encounter for screening for infections with a predominantly sexual mode of transmission: Secondary | ICD-10-CM

## 2024-04-06 DIAGNOSIS — N898 Other specified noninflammatory disorders of vagina: Secondary | ICD-10-CM | POA: Insufficient documentation

## 2024-04-06 LAB — POCT URINALYSIS DIPSTICK
Blood, UA: NEGATIVE
Glucose, UA: NEGATIVE
Nitrite, UA: NEGATIVE
Protein, UA: POSITIVE — AB

## 2024-04-06 MED ORDER — SULFAMETHOXAZOLE-TRIMETHOPRIM 800-160 MG PO TABS: 1.0000 | ORAL_TABLET | Freq: Two times a day (BID) | ORAL | 0 refills | Status: DC

## 2024-04-06 NOTE — Progress Notes (Signed)
 Subjective:     Patient ID: Debbie Santana, female   DOB: March 02, 1991, 33 y.o.   MRN: [id number]  HPI Debbie Santana is a 33 year old white female,single, G2P1011, in complaining of vaginal discharge, burning with urination and cyst in vaginal area, and pelvic pain on and off,like bloated.     Component Value Date/Time   DIAGPAP - Low grade squamous intraepithelial lesion (LSIL) (A) 07/15/2023 1112   DIAGPAP (A) 06/13/2022 1453    - Atypical squamous cells of undetermined significance (ASC-US )   DIAGPAP (A) 09/05/2021 1037    - Atypical squamous cells of undetermined significance (ASC-US )   HPVHIGH Negative 07/15/2023 1112   HPVHIGH Negative 06/13/2022 1453   HPVHIGH Negative 09/05/2021 1037   ADEQPAP  07/15/2023 1112    Satisfactory for evaluation; transformation zone component PRESENT.   ADEQPAP  06/13/2022 1453    Satisfactory for evaluation; transformation zone component PRESENT.   ADEQPAP  09/05/2021 1037    Satisfactory for evaluation; transformation zone component PRESENT.   Had colpo 07/30/23 low grade cells    Review of Systems  +vaginal discharge,  +burning with urination +cyst in vaginal area,  +pelvic pain on and off,like bloated.   No new sex partners Reviewed past medical,surgical, social and family history. Reviewed medications and allergies.  Objective:   Physical Exam BP 120/81 (BP Location: Left Arm, Patient Position: Sitting, Cuff Size: Normal)   Pulse 80   Ht 5' 8 (1.727 m)   Wt 183 lb (83 kg)   LMP 03/14/2024 (Approximate)   BMI 27.83 kg/m  urine dipstick trace protein,ketones, and leuks Skin warm and dry.Pelvic: external genitalia is normal in appearance, has 2 cm boil on mons pubis on the left looks bruised, vagina: white discharge without odor,urethra has no lesions or masses noted, cervix:smooth and bulbous, uterus: normal size, shape and contour, non tender, no masses felt, adnexa: no masses or tenderness noted. Bladder is non tender and no masses felt. CV  swab obtained.      Upstream - 04/06/24 1359       Pregnancy Intention Screening   Does the patient want to become pregnant in the next year? No    Does the patient's partner want to become pregnant in the next year? No    Would the patient like to discuss contraceptive options today? No      Contraception Wrap Up   Current Method Hormonal Implant    End Method Hormonal Implant    Contraception Counseling Provided Yes         Examination chaperoned by Clarita Salt LPN  Assessment:     1. Pain with urination UA C&S sent to rule out UTI  - POCT Urinalysis Dipstick - Urine Culture - Urinalysis, Routine w reflex microscopic  2. Burning with urination UA C&S sent  - POCT Urinalysis Dipstick - Urine Culture - Urinalysis, Routine w reflex microscopic  3. Vaginal discharge +white discharge, no odor CV swab sent  - Cervicovaginal ancillary only( [place])  4. Boil (Primary) Has 2 cm boil on mons pubis. Will rx septra  ds 1 bid x 14 days, this may help with urine too Use warm compress, do not squeeze Meds ordered this encounter  Medications   sulfamethoxazole -trimethoprim  (BACTRIM  DS) 800-160 MG tablet    Sig: Take 1 tablet by mouth 2 (two) times daily. Take 1 bid    Dispense:  28 tablet    Refill:  0    Supervising Provider:   JAYNE MINDER H [[id number]]  5. Pelvic pain Has on and off, feels bloated at times   6. Screening examination for STD (sexually transmitted disease) CV swab sent for GC/CHL,trich,BV and yeast - Cervicovaginal ancillary only( [place])     Plan:     Follow up in 3 weeks for recheck

## 2024-04-07 ENCOUNTER — Other Ambulatory Visit: Payer: Self-pay | Admitting: Neurosurgery

## 2024-04-07 LAB — URINALYSIS, ROUTINE W REFLEX MICROSCOPIC
Bilirubin, UA: NEGATIVE
Glucose, UA: NEGATIVE
Nitrite, UA: NEGATIVE
RBC, UA: NEGATIVE
Specific Gravity, UA: 1.024 (ref 1.005–1.030)
Urobilinogen, Ur: 1 mg/dL (ref 0.2–1.0)
pH, UA: 7 (ref 5.0–7.5)

## 2024-04-07 LAB — CERVICOVAGINAL ANCILLARY ONLY
Bacterial Vaginitis (gardnerella): POSITIVE — AB
Candida Glabrata: NEGATIVE
Candida Vaginitis: NEGATIVE
Chlamydia: NEGATIVE
Comment: NEGATIVE
Comment: NEGATIVE
Comment: NEGATIVE
Comment: NEGATIVE
Comment: NEGATIVE
Comment: NORMAL
Neisseria Gonorrhea: NEGATIVE
Trichomonas: NEGATIVE

## 2024-04-07 LAB — MICROSCOPIC EXAMINATION
Casts: NONE SEEN /LPF
Epithelial Cells (non renal): >10 /HPF — AB (ref 0–10)

## 2024-04-08 ENCOUNTER — Other Ambulatory Visit: Payer: Self-pay

## 2024-04-08 ENCOUNTER — Other Ambulatory Visit (HOSPITAL_COMMUNITY): Payer: Self-pay

## 2024-04-08 ENCOUNTER — Telehealth: Payer: Self-pay | Admitting: Pharmacy Technician

## 2024-04-08 ENCOUNTER — Ambulatory Visit: Payer: Self-pay | Admitting: Adult Health

## 2024-04-08 DIAGNOSIS — F32A Depression, unspecified: Secondary | ICD-10-CM

## 2024-04-08 DIAGNOSIS — F5101 Primary insomnia: Secondary | ICD-10-CM

## 2024-04-08 LAB — URINE CULTURE: Organism ID, Bacteria: NO GROWTH

## 2024-04-08 MED ORDER — VRAYLAR 1.5 MG PO CAPS: 1.5000 mg | ORAL_CAPSULE | Freq: Every evening | ORAL | 2 refills | Status: DC

## 2024-04-08 MED ORDER — TRAZODONE HCL 50 MG PO TABS: 100.0000 mg | ORAL_TABLET | Freq: Every day | ORAL | 2 refills | Status: DC

## 2024-04-08 MED ORDER — METRONIDAZOLE 500 MG PO TABS: 500.0000 mg | ORAL_TABLET | Freq: Two times a day (BID) | ORAL | 0 refills | Status: DC

## 2024-04-08 NOTE — Telephone Encounter (Signed)
 Pharmacy Patient Advocate Encounter   Received notification from CoverMyMeds that prior authorization for Vraylar  1.5MG  capsules is required/requested.   Insurance verification completed.   The patient is insured through [facility] .   Per test claim: PA required; PA submitted to above mentioned insurance via LATENT Key/confirmation #/EOC [facility] Status is pending

## 2024-04-09 ENCOUNTER — Other Ambulatory Visit (HOSPITAL_COMMUNITY): Payer: Self-pay

## 2024-04-09 NOTE — Telephone Encounter (Signed)
 Pharmacy Patient Advocate Encounter  Received notification from [facility] that Prior Authorization for  Vraylar  1.5MG  capsules  has been APPROVED from 04/08/2024 to 04/08/2025. Ran test claim, Copay is $4.00. This test claim was processed through [facility]- copay amounts may vary at other pharmacies due to pharmacy/plan contracts, or as the patient moves through the different stages of their insurance plan.   PA #/Case ID/Reference #: [id number]

## 2024-04-12 ENCOUNTER — Encounter (INDEPENDENT_AMBULATORY_CARE_PROVIDER_SITE_OTHER): Payer: Self-pay | Admitting: Gastroenterology

## 2024-04-12 ENCOUNTER — Encounter (HOSPITAL_COMMUNITY): Payer: Self-pay

## 2024-04-12 ENCOUNTER — Ambulatory Visit (INDEPENDENT_AMBULATORY_CARE_PROVIDER_SITE_OTHER): Admitting: Gastroenterology

## 2024-04-12 VITALS — BP 130/71 | HR 94 | Temp 97.8°F | Ht 68.0 in | Wt 177.9 lb

## 2024-04-12 DIAGNOSIS — R109 Unspecified abdominal pain: Secondary | ICD-10-CM

## 2024-04-12 DIAGNOSIS — R112 Nausea with vomiting, unspecified: Secondary | ICD-10-CM | POA: Diagnosis not present

## 2024-04-12 DIAGNOSIS — G8929 Other chronic pain: Secondary | ICD-10-CM

## 2024-04-12 DIAGNOSIS — F12188 Cannabis abuse with other cannabis-induced disorder: Secondary | ICD-10-CM

## 2024-04-12 DIAGNOSIS — K5900 Constipation, unspecified: Secondary | ICD-10-CM | POA: Diagnosis not present

## 2024-04-12 DIAGNOSIS — K581 Irritable bowel syndrome with constipation: Secondary | ICD-10-CM

## 2024-04-12 MED ORDER — LUBIPROSTONE 24 MCG PO CAPS: 24.0000 ug | ORAL_CAPSULE | Freq: Two times a day (BID) | ORAL | 3 refills | Status: AC

## 2024-04-12 MED ORDER — OMEPRAZOLE 20 MG PO CPDR: 20.0000 mg | DELAYED_RELEASE_CAPSULE | Freq: Every day | ORAL | 3 refills | Status: DC

## 2024-04-12 MED ORDER — PROMETHAZINE HCL 12.5 MG RE SUPP: 12.5000 mg | Freq: Three times a day (TID) | RECTAL | 1 refills | Status: AC | PRN

## 2024-04-12 NOTE — Progress Notes (Unsigned)
 Toribio Fortune, M.D. Gastroenterology & Hepatology [facility] Gastroenterology [address] [place], [place] [address]  Primary Care Physician: Patient, No Pcp Per No address on file  I will communicate my assessment and recommendations to the referring MD via EMR.  Problems: Chronic nausea and vomiting, possibly related to cannabinoid hyperemesis syndrome History of H. pylori  History of Present Illness: Debbie Santana is a 33 y.o. female with past medical history of anxiety, depression, GERD, gastric ulcers, panic disorder, migraine, cannabis hyperemesis syndrome, history of H. pylori status post Pylera , who presents for evaluation of nausea and vomiting.  The patient was last seen on 02/04/2023. At that time, the patient was scheduled for EGD with findings described below.  She was continued on Protonix  40 mg daily, Carafate  1 g every 6 hours, Bentyl  as needed for abdominal pain, Linzess 72 mcg (samples provided), Zofran  as needed and promethazine  suppositories.  Patient reports that she is having nausea every other day, sometimes may vomit. She states Zofran  does not really help, so she mostly relies on the Phenergan  suppositories. She needs refills of these.  She reports that when she feels nauseated, she may have pain in her mid abdomen. Pain is intermittent in severity and frequency. She also reports that she has lost some weight as she has not been eating too much.   Takes sucralfate  for abdominal pain. She also states omeprazole  helps with her abdominal pain - needs a refill of this medication as well. Bentyl  has been used in the past without resolution of symptoms.   She moves her bowels a couple of times a week. Does not take anything on a regular basis.  The patient denies having any nausea, vomiting, fever, chills, hematochezia, melena, hematemesis, abdominal distention, diarrhea, jaundice, pruritus .  Last EGD  03/06/2023 2 cm hiatal hernia.   Normal stomach and duodenum.  Pathology showed normal gastric and small bowel samples.  Last Colonoscopy: 08/29/2020  - normal TI, normal colon neg for microscopic colitis, 2 mm polyp in sigmoid (lymphoid aggregate) and 8 mm polyp in rectum (condyloma accuminatum).  Past Medical History: Past Medical History:  Diagnosis Date   Anxiety    Bacterial vaginosis    Bradycardia 04/17/2017   Chest pain on breathing 02/22/2020   Chronic back pain    Depression    Erosive gastritis 04/11/2017   Gastric ulcer    Genital warts    GERD (gastroesophageal reflux disease)    High cholesterol    History of gestational hypertension 05/21/2018   Dx intrapartum   History of kidney stones    Intertrigo 11/16/2019   Intractable abdominal pain 04/11/2017   Intractable nausea and vomiting 12/07/2018   Intractable vomiting 05/20/2020   Irregular intermenstrual bleeding 02/19/2019   Mental disorder    Migraine without aura and without status migrainosus, not intractable 02/19/2019   Nausea 05/10/2020   Nausea & vomiting 12/07/2018   Nexplanon  in place 02/19/2019   Obesity 04/17/2017   Panic disorder    Peptic ulcer disease 04/13/2017   Pinched nerve 09/24/2019   Postpartum hypertension 06/23/2018   requiring norvasc , resolved at pp visit   Sciatica    Scoliosis    Tremor of both hands    Trichomoniasis    Vaginal Pap smear, abnormal    Wheezing     Past Surgical History: Past Surgical History:  Procedure Laterality Date   BACK SURGERY     ruptured disc.   BACK SURGERY  11/2021  BIOPSY  04/14/2017   Procedure: BIOPSY;  Surgeon: Golda Claudis PENNER, MD;  [facility];  Service: Endoscopy;;  gastric   BIOPSY  05/22/2020   Procedure: BIOPSY;  Surgeon: Shaaron Lamar HERO, MD;  [facility];  Service: Endoscopy;;   BIOPSY  08/29/2020   Procedure: BIOPSY;  Surgeon: Eartha Angelia Sieving, MD;  [facility];  Service: Gastroenterology;;   BIOPSY  09/09/2020    Procedure: BIOPSY;  Surgeon: Cindie Carlin POUR, DO;  [facility];  Service: Endoscopy;;  duodenum;antral   BIOPSY  11/21/2021   Procedure: BIOPSY;  Surgeon: Golda Claudis PENNER, MD;  [facility];  Service: Endoscopy;;   BIOPSY  03/06/2023   Procedure: BIOPSY;  Surgeon: Eartha Angelia Sieving, MD;  [facility];  Service: Gastroenterology;;   CERVICAL ABLATION N/A 12/02/2018   Procedure: LASER ABLATION OF CERVIX;  Surgeon: Jayne Vonn DEL, MD;  Location: [facility];  Service: Gynecology;  Laterality: N/A;   CHOLECYSTECTOMY N/A 04/17/2017   Procedure: LAPAROSCOPIC CHOLECYSTECTOMY;  Surgeon: Mavis Anes, MD;  [facility]: [facility];  Service: General;  Laterality: N/A;   COLONOSCOPY WITH PROPOFOL  N/A 08/29/2020   Procedure: COLONOSCOPY WITH PROPOFOL ;  Surgeon: Eartha Angelia Sieving, MD;  [facility];  Service: Gastroenterology;  Laterality: N/A;  11:15   COLPOSCOPY     ESOPHAGOGASTRODUODENOSCOPY (EGD) WITH PROPOFOL  N/A 04/14/2017   Procedure: ESOPHAGOGASTRODUODENOSCOPY (EGD) WITH PROPOFOL ;  Surgeon: Golda Claudis PENNER, MD;  [facility];  Service: Endoscopy;  Laterality: N/A;   ESOPHAGOGASTRODUODENOSCOPY (EGD) WITH PROPOFOL  N/A 05/22/2020   Procedure: ESOPHAGOGASTRODUODENOSCOPY (EGD) WITH PROPOFOL ;  Surgeon: Shaaron Lamar HERO, MD;  [facility];  Service: Endoscopy;  Laterality: N/A;   ESOPHAGOGASTRODUODENOSCOPY (EGD) WITH PROPOFOL  N/A 09/09/2020   Procedure: ESOPHAGOGASTRODUODENOSCOPY (EGD) WITH PROPOFOL ;  Surgeon: Cindie Carlin POUR, DO;  [facility];  Service: Endoscopy;  Laterality: N/A;   ESOPHAGOGASTRODUODENOSCOPY (EGD) WITH PROPOFOL  N/A 11/21/2020   Procedure: ESOPHAGOGASTRODUODENOSCOPY (EGD) WITH PROPOFOL ;  Surgeon: Eartha Angelia Sieving, MD;  [facility];  Service: Gastroenterology;  Laterality: N/A;  Am   ESOPHAGOGASTRODUODENOSCOPY (EGD) WITH PROPOFOL  N/A 11/21/2021   Procedure:  ESOPHAGOGASTRODUODENOSCOPY (EGD) WITH PROPOFOL ;  Surgeon: Golda Claudis PENNER, MD;  [facility];  Service: Endoscopy;  Laterality: N/A;  820   ESOPHAGOGASTRODUODENOSCOPY (EGD) WITH PROPOFOL  N/A 03/06/2023   Procedure: ESOPHAGOGASTRODUODENOSCOPY (EGD) WITH PROPOFOL ;  Surgeon: Eartha Angelia Sieving, MD;  [facility];  Service: Gastroenterology;  Laterality: N/A;  11:45AM;ASA 2   HERNIA REPAIR Bilateral    inguinal   LASER ABLATION CONDOLAMATA N/A 12/02/2018   Procedure: LASER ABLATION CONDYLOMA ACCUMINATA LEFT AND RIGHT VULVA, PERINEUM AND PERIANAL (15 TOTAL);  Surgeon: Jayne Vonn DEL, MD;  [facility]: [facility];  Service: Gynecology;  Laterality: N/A;   LUMBAR LAMINECTOMY/DECOMPRESSION MICRODISCECTOMY Right 12/03/2021   Procedure: MICRODISCECTOMY Lumbar four- five;  Surgeon: Mavis Purchase, MD;  [facility];  Service: Neurosurgery;  Laterality: Right;   MOUTH SURGERY  09/2021   All top teeth and all wisdom teeth removed   MOUTH SURGERY  12/2021   MOUTH SURGERY  02/2024   toothe removed   POLYPECTOMY  08/29/2020   Procedure: POLYPECTOMY;  Surgeon: Eartha Angelia Sieving, MD;  [facility];  Service: Gastroenterology;;    Family History: Family History  Problem Relation Age of Onset   Anxiety disorder Mother    Hyperlipidemia Mother    Crohn's disease Sister    Colon cancer Maternal Aunt  Diabetes Maternal Grandfather    Diabetes Cousin    Learning disabilities Cousin     Social History: Social History   Tobacco Use  Smoking Status Every Day   Current packs/day: 1.00   Average packs/day: 1 pack/day for 10.0 years (10.0 ttl pk-yrs)   Types: Cigarettes   Passive exposure: Current  Smokeless Tobacco Never   Social History   Substance and Sexual Activity  Alcohol  Use Not Currently   Social History   Substance and Sexual Activity  Drug Use Not Currently   Types: Marijuana    Allergies: Allergies  Allergen Reactions   Tape Other  (See Comments)    Blisters   Buspirone  Other (See Comments)    Shaky/jittery   Fluoxetine Other (See Comments)    Ineffective; did not resolve depression/anxiety   Nicoderm [Nicotine ] Itching    Sharp pain at site  Updated 09/10/2020- patient tolerated nicotine  patch   Nsaids Other (See Comments)    History of ulcers    Medications: Current Outpatient Medications  Medication Sig Dispense Refill   butalbital -acetaminophen -caffeine  (FIORICET) 50-325-40 MG tablet Take 1 tablet by mouth every 12 (twelve) hours as needed for migraine. 60 tablet 0   clonazePAM  (KLONOPIN ) 1 MG tablet Take 1 tablet (1 mg total) by mouth 4 (four) times daily as needed for anxiety. 120 tablet 0   diphenhydrAMINE  (BENADRYL ) 25 MG tablet Take 25 mg by mouth every 6 (six) hours as needed for allergies.     DULoxetine  (CYMBALTA ) 30 MG capsule Take 1 capsule (30 mg total) by mouth daily for 7 days, THEN 2 capsules (60 mg total) daily for 23 days. Do not start until finished with sertraline  taper. 60 capsule 3   etonogestrel  (NEXPLANON ) 68 MG IMPL implant 68 mg by Subdermal route once.     gabapentin  (NEURONTIN ) 300 MG capsule Take 1 capsule (300 mg total) by mouth 2 (two) times daily. 60 capsule 1   methocarbamol  (ROBAXIN -750) 750 MG tablet Take 1 tablet (750 mg total) by mouth every 8 (eight) hours as needed for muscle spasms. 90 tablet 1   metroNIDAZOLE  (FLAGYL ) 500 MG tablet Take 1 tablet (500 mg total) by mouth 2 (two) times daily. 14 tablet 0   primidone  (MYSOLINE ) 50 MG tablet Take 1 tablet (50 mg total) by mouth at bedtime. 30 tablet 11   promethazine  (PHENERGAN ) 12.5 MG suppository INSERT 1 SUPPOSITORY RECTALLY EVERY 8 HOURS AS NEEDED FOR NAUSEA FOR VOMITING 20 each 0   SPIRIVA RESPIMAT 2.5 MCG/ACT AERS Inhale 2 each into the lungs daily as needed (respiratory issues.).     sucralfate  (CARAFATE ) 1 g tablet Take 1 tablet (1 g total) by mouth 3 (three) times daily before meals. 90 tablet 3    sulfamethoxazole -trimethoprim  (BACTRIM  DS) 800-160 MG tablet Take 1 tablet by mouth 2 (two) times daily. Take 1 bid 28 tablet 0   traZODone  (DESYREL ) 50 MG tablet Take 2 tablets (100 mg total) by mouth at bedtime. 60 tablet 2   valACYclovir  (VALTREX ) 1000 MG tablet Take 1 tablet (1,000 mg total) by mouth 2 (two) times daily. (Patient taking differently: Take 1,000 mg by mouth as needed.) 20 tablet 3   VRAYLAR  1.5 MG capsule Take 1 capsule (1.5 mg total) by mouth every evening. 30 capsule 2   ondansetron  (ZOFRAN ) 4 MG tablet Take 1 tablet (4 mg total) by mouth every 8 (eight) hours as needed for nausea or vomiting. (Patient not taking: Reported on 04/12/2024) 15 tablet 0   No current facility-administered  medications for this visit.    Review of Systems: GENERAL: negative for malaise, night sweats HEENT: No changes in hearing or vision, no nose bleeds or other nasal problems. NECK: Negative for lumps, goiter, pain and significant neck swelling RESPIRATORY: Negative for cough, wheezing CARDIOVASCULAR: Negative for chest pain, leg swelling, palpitations, orthopnea GI: SEE HPI MUSCULOSKELETAL: Negative for joint pain or swelling, back pain, and muscle pain. SKIN: Negative for lesions, rash PSYCH: Negative for sleep disturbance, mood disorder and recent psychosocial stressors. HEMATOLOGY Negative for prolonged bleeding, bruising easily, and swollen nodes. ENDOCRINE: Negative for cold or heat intolerance, polyuria, polydipsia and goiter. NEURO: negative for tremor, gait imbalance, syncope and seizures. The remainder of the review of systems is noncontributory.   Physical Exam: BP 130/71 (BP Location: Left Arm, Patient Position: Sitting, Cuff Size: Normal)   Pulse 94   Temp 97.8 F (36.6 C) (Temporal)   Ht 5' 8 (1.727 m)   Wt 177 lb 14.4 oz (80.7 kg)   LMP 03/14/2024 (Approximate)   BMI 27.05 kg/m  GENERAL: The patient is AO x3, in no acute distress. HEENT: Head is normocephalic and  atraumatic. EOMI are intact. Mouth is well hydrated and without lesions. NECK: Supple. No masses LUNGS: Clear to auscultation. No presence of rhonchi/wheezing/rales. Adequate chest expansion HEART: RRR, normal s1 and s2. ABDOMEN: Soft, nontender, no guarding, no peritoneal signs, and nondistended. BS +. No masses. RECTAL EXAM: no external lesions, normal tone, no masses, brown stool without blood.*** Chaperone: EXTREMITIES: Without any cyanosis, clubbing, rash, lesions or edema. NEUROLOGIC: AOx3, no focal motor deficit. SKIN: no jaundice, no rashes  Imaging/Labs: as above  I personally reviewed and interpreted the available labs, imaging and endoscopic files.  Impression and Plan: Debbie Santana is a 33 y.o. female coming for follow up of ***   All questions were answered.      Toribio Fortune, MD Gastroenterology and Hepatology [facility] Gastroenterology

## 2024-04-12 NOTE — Patient Instructions (Signed)
 Restart omeprazole  40 mg daily Continue sucralfate  as needed for abdominal pain Continue Phenergan  as needed for nausea Stop Zofran  Start Amitiza  24 mcg twice a day

## 2024-04-12 NOTE — Progress Notes (Signed)
 Surgical Instructions   Your procedure is scheduled on Thursday, 04/22/24.SABRA Report to [facility] Main Entrance A at 10:15 A.M., then check in with the Admitting office. Any questions or running late day of surgery: call [phone]  Questions prior to your surgery date: call [phone], Monday-Friday, 8am-4pm. If you experience any cold or flu symptoms such as cough, fever, chills, shortness of breath, etc. between now and your scheduled surgery, please notify us  at the above number.     Remember:  Do not eat or drink after midnight the night before your surgery-Wednesday.    Take these medicines the morning of surgery with A SIP OF WATER : DULoxetine  (CYMBALTA ) gabapentin  (NEURONTIN )  omeprazole  (PRILOSEC)  sulfamethoxazole -trimethoprim  (BACTRIM  DS)    May take these medicines IF NEEDED: butalbital -acetaminophen -caffeine  (FIORICET)  clonazePAM  (KLONOPIN )  diphenhydrAMINE  (BENADRYL )  methocarbamol  (ROBAXIN -750)  promethazine  (PHENERGAN )  SPIRIVA RESPIMAT INHALER valACYclovir  (VALTREX )    One week prior to surgery, STOP taking any Aspirin (unless otherwise instructed by your surgeon) Aleve , Naproxen , Ibuprofen , Motrin , Advil , Goody's, BC's, all herbal medications, fish oil, and non-prescription vitamins.                     Do NOT Smoke (Tobacco/Vaping) for 24 hours prior to your procedure.  If you use a CPAP at night, you may bring your mask/headgear for your overnight stay.   You will be asked to remove any contacts, glasses, piercing's, hearing aid's, dentures/partials prior to surgery. Please bring cases for these items if needed.    Patients discharged the day of surgery will not be allowed to drive home, and someone needs to stay with them for 24 hours.  SURGICAL WAITING ROOM VISITATION Patients may have no more than 2 support people in the waiting area - these visitors may rotate.   Pre-op nurse will coordinate an appropriate time for 1 ADULT support person, who  may not rotate, to accompany patient in pre-op.  Children under the age of 100 must have an adult with them who is not the patient and must remain in the main waiting area with an adult.  If the patient needs to stay at the hospital during part of their recovery, the visitor guidelines for inpatient rooms apply.  Please refer to the [facility] website for the visitor guidelines for any additional information.   If you received a COVID test during your pre-op visit  it is requested that you wear a mask when out in public, stay away from anyone that may not be feeling well and notify your surgeon if you develop symptoms. If you have been in contact with anyone that has tested positive in the last 10 days please notify you surgeon.      Pre-operative 5 CHG Bathing Instructions   You can play a key role in reducing the risk of infection after surgery. Your skin needs to be as free of germs as possible. You can reduce the number of germs on your skin by washing with CHG (chlorhexidine  gluconate) soap before surgery. CHG is an antiseptic soap that kills germs and continues to kill germs even after washing.   DO NOT use if you have an allergy to chlorhexidine /CHG or antibacterial soaps. If your skin becomes reddened or irritated, stop using the CHG and notify one of our RNs at [phone].   Please shower with the CHG soap starting 4 days before surgery using the following schedule:     Please keep in mind the following:  DO NOT shave,  including legs and underarms, starting the day of your first shower.   You may shave your face at any point before/day of surgery.  Place clean sheets on your bed the day you start using CHG soap. Use a clean washcloth (not used since being washed) for each shower. DO NOT sleep with pets once you start using the CHG.   CHG Shower Instructions:  Wash your face and private area with normal soap. If you choose to wash your hair, wash first with your normal  shampoo.  After you use shampoo/soap, rinse your hair and body thoroughly to remove shampoo/soap residue.  Turn the water  OFF and apply about 3 tablespoons (45 ml) of CHG soap to a CLEAN washcloth.  Apply CHG soap ONLY FROM YOUR NECK DOWN TO YOUR TOES (washing for 3-5 minutes)  DO NOT use CHG soap on face, private areas, open wounds, or sores.  Pay special attention to the area where your surgery is being performed.  If you are having back surgery, having someone wash your back for you may be helpful. Wait 2 minutes after CHG soap is applied, then you may rinse off the CHG soap.  Pat dry with a clean towel  Put on clean clothes/pajamas   If you choose to wear lotion, please use ONLY the CHG-compatible lotions that are listed below.  Additional instructions for the day of surgery: DO NOT APPLY any lotions, deodorants, cologne, or perfumes.   Do not bring valuables to the hospital. [facility] is not responsible for any belongings/valuables. Do not wear nail polish, gel polish, artificial nails, or any other type of covering on natural nails (fingers and toes) Do not wear jewelry or makeup Put on clean/comfortable clothes.  Please brush your teeth.  Ask your nurse before applying any prescription medications to the skin.     CHG Compatible Lotions   Aveeno Moisturizing lotion  Cetaphil Moisturizing Cream  Cetaphil Moisturizing Lotion  Clairol Herbal Essence Moisturizing Lotion, Dry Skin  Clairol Herbal Essence Moisturizing Lotion, Extra Dry Skin  Clairol Herbal Essence Moisturizing Lotion, Normal Skin  Curel Age Defying Therapeutic Moisturizing Lotion with Alpha Hydroxy  Curel Extreme Care Body Lotion  Curel Soothing Hands Moisturizing Hand Lotion  Curel Therapeutic Moisturizing Cream, Fragrance-Free  Curel Therapeutic Moisturizing Lotion, Fragrance-Free  Curel Therapeutic Moisturizing Lotion, Original Formula  Eucerin Daily Replenishing Lotion  Eucerin Dry Skin Therapy Plus  Alpha Hydroxy Crme  Eucerin Dry Skin Therapy Plus Alpha Hydroxy Lotion  Eucerin Original Crme  Eucerin Original Lotion  Eucerin Plus Crme Eucerin Plus Lotion  Eucerin TriLipid Replenishing Lotion  Keri Anti-Bacterial Hand Lotion  Keri Deep Conditioning Original Lotion Dry Skin Formula Softly Scented  Keri Deep Conditioning Original Lotion, Fragrance Free Sensitive Skin Formula  Keri Lotion Fast Absorbing Fragrance Free Sensitive Skin Formula  Keri Lotion Fast Absorbing Softly Scented Dry Skin Formula  Keri Original Lotion  Keri Skin Renewal Lotion Keri Silky Smooth Lotion  Keri Silky Smooth Sensitive Skin Lotion  Nivea Body Creamy Conditioning Oil  Nivea Body Extra Enriched Lotion  Nivea Body Original Lotion  Nivea Body Sheer Moisturizing Lotion Nivea Crme  Nivea Skin Firming Lotion  NutraDerm 30 Skin Lotion  NutraDerm Skin Lotion  NutraDerm Therapeutic Skin Cream  NutraDerm Therapeutic Skin Lotion  ProShield Protective Hand Cream  Provon moisturizing lotion  Please read over the following fact sheets that you were given.

## 2024-04-13 ENCOUNTER — Other Ambulatory Visit: Payer: Self-pay

## 2024-04-13 ENCOUNTER — Encounter (HOSPITAL_COMMUNITY): Payer: Self-pay

## 2024-04-13 ENCOUNTER — Encounter (HOSPITAL_COMMUNITY)
Admission: RE | Admit: 2024-04-13 | Discharge: 2024-04-13 | Disposition: A | Discharge status: Home / Self Care | Source: Ambulatory Visit | Attending: Neurosurgery

## 2024-04-13 VITALS — BP 120/80 | HR 98 | Temp 98.4°F | Resp 16 | Ht 68.0 in | Wt 177.5 lb

## 2024-04-13 DIAGNOSIS — M4722 Other spondylosis with radiculopathy, cervical region: Secondary | ICD-10-CM | POA: Insufficient documentation

## 2024-04-13 DIAGNOSIS — Z01818 Encounter for other preprocedural examination: Secondary | ICD-10-CM | POA: Insufficient documentation

## 2024-04-13 DIAGNOSIS — M5011 Cervical disc disorder with radiculopathy,  high cervical region: Secondary | ICD-10-CM | POA: Diagnosis not present

## 2024-04-13 LAB — CBC
HCT: 43.6 % (ref 36.0–46.0)
Hemoglobin: 14.9 g/dL (ref 12.0–15.0)
MCH: 32.3 pg (ref 26.0–34.0)
MCHC: 34.2 g/dL (ref 30.0–36.0)
MCV: 94.4 fL (ref 80.0–100.0)
Platelets: 181 K/uL (ref 150–400)
RBC: 4.62 MIL/uL (ref 3.87–5.11)
RDW: 12.1 % (ref 11.5–15.5)
WBC: 12.2 K/uL — ABNORMAL HIGH (ref 4.0–10.5)
nRBC: 0 % (ref 0.0–0.2)

## 2024-04-13 LAB — COMPREHENSIVE METABOLIC PANEL WITH GFR
ALT: 11 U/L (ref 0–44)
AST: 17 U/L (ref 15–41)
Albumin: 3.9 g/dL (ref 3.5–5.0)
Alkaline Phosphatase: 57 U/L (ref 38–126)
Anion gap: 9 (ref 5–15)
BUN: 5 mg/dL — ABNORMAL LOW (ref 6–20)
CO2: 25 mmol/L (ref 22–32)
Calcium: 9.7 mg/dL (ref 8.9–10.3)
Chloride: 105 mmol/L (ref 98–111)
Creatinine, Ser: 0.9 mg/dL (ref 0.44–1.00)
GFR, Estimated: >60 mL/min (ref >60)
Glucose, Bld: 78 mg/dL (ref 70–99)
Potassium: 4.3 mmol/L (ref 3.5–5.1)
Sodium: 139 mmol/L (ref 135–145)
Total Bilirubin: 0.5 mg/dL (ref 0.0–1.2)
Total Protein: 6.9 g/dL (ref 6.5–8.1)

## 2024-04-13 LAB — TYPE AND SCREEN
ABO/RH(D): A POS
Antibody Screen: NEGATIVE

## 2024-04-13 LAB — SURGICAL PCR SCREEN
MRSA, PCR: NEGATIVE
Staphylococcus aureus: NEGATIVE

## 2024-04-13 NOTE — Progress Notes (Addendum)
 PCP - Manus Dixon,MD Cardiologist - denies  PPM/ICD - denies Device Orders -  Rep Notified -   Chest x-ray - denies EKG - na Stress Test - denies ECHO - denies Cardiac Cath - denies  Sleep Study - denies CPAP -   Fasting Blood Sugar - na Checks Blood Sugar _____ times a day  Last dose of GLP1 agonist-  na GLP1 instructions:   Blood Thinner Instructions:na Aspirin Instructions:na  ERAS Protcol -no PRE-SURGERY Ensure or G2-   COVID TEST- na   Anesthesia review: yes- cocaine use last used 04/09/24. Left voicemail for [facility] at Dr. Hugo office that patient used cocaine 04/09/24 and is being treated with Flagyl  and Bactrim  for a boil in pubic area. Sent [employer] with this information to Dr. Mavis.   Patient denies shortness of breath, fever, cough and chest pain at PAT appointment   All instructions explained to the patient, with a verbal understanding of the material. Patient agrees to go over the instructions while at home for a better understanding. The opportunity to ask questions was provided.

## 2024-04-14 NOTE — Progress Notes (Signed)
 Anesthesia Chart Review:  Case: [id number] Date/Time: 04/22/24 1203   Procedure: ANTERIOR CERVICAL DECOMPRESSION/DISCECTOMY FUSION 1 LEVEL - ACDF,IP,PLATE/SCREWS R65   Anesthesia type: General   Diagnosis: Cervical spondylosis with radiculopathy [M47.22]   Pre-op diagnosis: CERVICAL SPONDYLOSIS WITH RADICULOPATHY   [facility]OM [facility] / [facility]   Surgeons: Mavis Purchase, MD       DISCUSSION: Patient is a 33 year old female scheduled for the above procedure.  History includes smoking, polysubstance use (marijuana, cocaine), post-operative N/V, chronic nausea (possibly r/t cannabinoid hyperemesis syndrome), bradycardia, hypercholesterolemia, GERD, PUD (with H. Pylori, s/p Pylera ), anxiety with panic disorder, scoliosis, spinal surgery (Left L5-S1 laminectomy/microdiscectomy 01/14/08; right L4-5 discectomy 12/03/21), cholecystectomy (04/17/17).   She reported cocaine use last on 04/09/2024. Advised at her PAT RN visit not to use, and recent use could potentially could interfere with surgery plans. She is also on Bactrim  x 14 days for a 2 cm boil on her mons pubis (prescribed 04/06/24), and she was started on Flagyl  04/08/24 after vaginal cultures showed bacterial vaginitis (gardnerella). PAT RN left communication for Dr. Mavis and his surgery scheduler. Antiinfective course should be completed by her surgery date.   Anesthesia team to evaluate on the day of surgery.   VS: BP 120/80   Pulse 98   Temp 36.9 C   Resp 16   Ht 5' 8 (1.727 m)   Wt 80.5 kg   LMP 04/13/2024 (Exact Date)   SpO2 100%   BMI 26.99 kg/m    PROVIDERS: Melvenia Pastor, MD is PCP - Eartha Sieving, MD is GI. Last visit 04/12/24 for chronic nausea, intermittent abdominal pains, constipation. Last EGD 03/06/23 showed 2 cm hiatal hernia, normal stomach and duodenum, normal gastric and small bowel samples. Last colonoscopy 08/29/2020 showed normal TI, normal colon, negative for microscopic colitis, 2 mm polyp in sigmoid  (lymphoid aggregate) and 8 mm polyp in rectum (condyloma accuminatum). CT abd/pelvis on 12/12/23 showed possible enteritis in the RLQ, but no obstruction or acute bowel wall thickening.  At July visit, he advised resume omeprazole , continue sucralfate  and Phenergan  as needed, start Amitiza . GLENWOOD Signa Nest, NP is GYN provider, last visit 04/06/24. GLENWOOD Whitfield Raisin, NP is neurology provider. 02/11/24 visit for tremors since age 73 years old. Tremor possible tremor versus component of drug-induced tremor or worsen enhanced physiologic tremor.  She had difficulty tolerating propranolol  (bradycardia). She was started on primidone  25 mg Q HS. Klonipin as needed (prescribed by PCP) may also be helpful. MRI brain 12/25/22 was unremarkable. SABRA   LABS: Preoperative labs noted.  (all labs ordered are listed, but only abnormal results are displayed)  Labs Reviewed  COMPREHENSIVE METABOLIC PANEL WITH GFR - Abnormal; Notable for the following components:      Result Value   BUN 5 (*)    All other components within normal limits  CBC - Abnormal; Notable for the following components:   WBC 12.2 (*)    All other components within normal limits  SURGICAL PCR SCREEN  TYPE AND SCREEN    IMAGES: MRI  C-spine 04/03/24: IMPRESSION: - There is mild degenerative disc disease at C3-4. There is a prominent disc/foraminal spur on the left with mild facet arthropathy on the left and severe left neural foraminal stenosis. There is a smaller foraminal spur on the right with mild right neural foraminal stenosis. - Otherwise mild degenerative changes.  CT Abd/pelvis 12/12/23: IMPRESSION: Negative for bowel obstruction or acute bowel wall thickening. Few fluid-filled nondilated loops of small bowel in the  right lower quadrant, possible enteritis.   EKG:  EKG 01/14/23: Sinus bradycardia at 46 bpm Atrial premature complex Confirmed by Darra Chew [id number]) on 01/14/2023 4:12:37 PM   CV: N/A  Past Medical History:   Diagnosis Date   Anxiety    Bacterial vaginosis    Bradycardia 04/17/2017   Chest pain on breathing 02/22/2020   Chronic back pain    Depression    Erosive gastritis 04/11/2017   Gastric ulcer    Genital warts    GERD (gastroesophageal reflux disease)    High cholesterol    History of gestational hypertension 05/21/2018   Dx intrapartum   History of kidney stones    Intertrigo 11/16/2019   Intractable abdominal pain 04/11/2017   Intractable nausea and vomiting 12/07/2018   Intractable vomiting 05/20/2020   Irregular intermenstrual bleeding 02/19/2019   Mental disorder    Migraine without aura and without status migrainosus, not intractable 02/19/2019   Nausea 05/10/2020   Nausea & vomiting 12/07/2018   Nexplanon  in place 02/19/2019   Obesity 04/17/2017   Panic disorder    Peptic ulcer disease 04/13/2017   Pinched nerve 09/24/2019   PONV (postoperative nausea and vomiting)    Sciatica    Scoliosis    Tremor of both hands    Trichomoniasis    Vaginal Pap smear, abnormal    Wheezing     Past Surgical History:  Procedure Laterality Date   BACK SURGERY     ruptured disc.   BACK SURGERY  11/2021   BIOPSY  04/14/2017   Procedure: BIOPSY;  Surgeon: Golda Claudis PENNER, MD;  [facility];  Service: Endoscopy;;  gastric   BIOPSY  05/22/2020   Procedure: BIOPSY;  Surgeon: Shaaron Lamar HERO, MD;  [facility]: [facility];  Service: Endoscopy;;   BIOPSY  08/29/2020   Procedure: BIOPSY;  Surgeon: Eartha Angelia Sieving, MD;  [facility];  Service: Gastroenterology;;   BIOPSY  09/09/2020   Procedure: BIOPSY;  Surgeon: Cindie Carlin POUR, DO;  [facility];  Service: Endoscopy;;  duodenum;antral   BIOPSY  11/21/2021   Procedure: BIOPSY;  Surgeon: Golda Claudis PENNER, MD;  [facility];  Service: Endoscopy;;   BIOPSY  03/06/2023   Procedure: BIOPSY;  Surgeon: Eartha Angelia Sieving, MD;  [facility];  Service:  Gastroenterology;;   CERVICAL ABLATION N/A 12/02/2018   Procedure: LASER ABLATION OF CERVIX;  Surgeon: Jayne Vonn DEL, MD;  Location: [facility];  Service: Gynecology;  Laterality: N/A;   CHOLECYSTECTOMY N/A 04/17/2017   Procedure: LAPAROSCOPIC CHOLECYSTECTOMY;  Surgeon: Mavis Anes, MD;  Location: [facility];  Service: General;  Laterality: N/A;   COLONOSCOPY WITH PROPOFOL  N/A 08/29/2020   Procedure: COLONOSCOPY WITH PROPOFOL ;  Surgeon: Eartha Angelia Sieving, MD;  [facility];  Service: Gastroenterology;  Laterality: N/A;  11:15   COLPOSCOPY     ESOPHAGOGASTRODUODENOSCOPY (EGD) WITH PROPOFOL  N/A 04/14/2017   Procedure: ESOPHAGOGASTRODUODENOSCOPY (EGD) WITH PROPOFOL ;  Surgeon: Golda Claudis PENNER, MD;  [facility];  Service: Endoscopy;  Laterality: N/A;   ESOPHAGOGASTRODUODENOSCOPY (EGD) WITH PROPOFOL  N/A 05/22/2020   Procedure: ESOPHAGOGASTRODUODENOSCOPY (EGD) WITH PROPOFOL ;  Surgeon: Shaaron Lamar HERO, MD;  [facility];  Service: Endoscopy;  Laterality: N/A;   ESOPHAGOGASTRODUODENOSCOPY (EGD) WITH PROPOFOL  N/A 09/09/2020   Procedure: ESOPHAGOGASTRODUODENOSCOPY (EGD) WITH PROPOFOL ;  Surgeon: Cindie Carlin POUR, DO;  [facility];  Service: Endoscopy;  Laterality: N/A;   ESOPHAGOGASTRODUODENOSCOPY (EGD) WITH PROPOFOL  N/A 11/21/2020  Procedure: ESOPHAGOGASTRODUODENOSCOPY (EGD) WITH PROPOFOL ;  Surgeon: Eartha Angelia Sieving, MD;  [facility];  Service: Gastroenterology;  Laterality: N/A;  Am   ESOPHAGOGASTRODUODENOSCOPY (EGD) WITH PROPOFOL  N/A 11/21/2021   Procedure: ESOPHAGOGASTRODUODENOSCOPY (EGD) WITH PROPOFOL ;  Surgeon: Golda Claudis PENNER, MD;  [facility];  Service: Endoscopy;  Laterality: N/A;  820   ESOPHAGOGASTRODUODENOSCOPY (EGD) WITH PROPOFOL  N/A 03/06/2023   Procedure: ESOPHAGOGASTRODUODENOSCOPY (EGD) WITH PROPOFOL ;  Surgeon: Eartha Angelia Sieving, MD;  [facility];  Service: Gastroenterology;  Laterality:  N/A;  11:45AM;ASA 2   HERNIA REPAIR Bilateral    inguinal   LASER ABLATION CONDOLAMATA N/A 12/02/2018   Procedure: LASER ABLATION CONDYLOMA ACCUMINATA LEFT AND RIGHT VULVA, PERINEUM AND PERIANAL (15 TOTAL);  Surgeon: Jayne Vonn DEL, MD;  Location: [facility];  Service: Gynecology;  Laterality: N/A;   LUMBAR LAMINECTOMY/DECOMPRESSION MICRODISCECTOMY Right 12/03/2021   Procedure: MICRODISCECTOMY Lumbar four- five;  Surgeon: Mavis Purchase, MD;  Location: [facility] OR;  Service: Neurosurgery;  Laterality: Right;   MOUTH SURGERY  09/2021   All top teeth and all wisdom teeth removed   MOUTH SURGERY  12/2021   MOUTH SURGERY  02/2024   toothe removed   POLYPECTOMY  08/29/2020   Procedure: POLYPECTOMY;  Surgeon: Eartha Angelia, Sieving, MD;  [facility];  Service: Gastroenterology;;    MEDICATIONS:  butalbital -acetaminophen -caffeine  (FIORICET) 50-325-40 MG tablet   clonazePAM  (KLONOPIN ) 1 MG tablet   diphenhydrAMINE  (BENADRYL ) 25 MG tablet   DULoxetine  (CYMBALTA ) 30 MG capsule   etonogestrel  (NEXPLANON ) 68 MG IMPL implant   gabapentin  (NEURONTIN ) 300 MG capsule   lubiprostone  (AMITIZA ) 24 MCG capsule   methocarbamol  (ROBAXIN -750) 750 MG tablet   metroNIDAZOLE  (FLAGYL ) 500 MG tablet   omeprazole  (PRILOSEC) 20 MG capsule   ondansetron  (ZOFRAN ) 4 MG tablet   primidone  (MYSOLINE ) 50 MG tablet   promethazine  (PHENERGAN ) 12.5 MG suppository   SPIRIVA RESPIMAT 2.5 MCG/ACT AERS   sucralfate  (CARAFATE ) 1 g tablet   sulfamethoxazole -trimethoprim  (BACTRIM  DS) 800-160 MG tablet   traZODone  (DESYREL ) 50 MG tablet   valACYclovir  (VALTREX ) 1000 MG tablet   VRAYLAR  1.5 MG capsule   No current facility-administered medications for this encounter.    Isaiah Ruder, PA-C Surgical Short Stay/Anesthesiology [facility] Phone [phone] [facility] Phone [phone] 04/14/2024 4:57 PM

## 2024-04-14 NOTE — Anesthesia Preprocedure Evaluation (Addendum)
 Anesthesia Evaluation  Patient identified by MRN, date of birth, ID band Patient awake    Reviewed: Allergy & Precautions, NPO status , Patient's Chart, lab work & pertinent test results  History of Anesthesia Complications (+) PONV and history of anesthetic complications  Airway Mallampati: II  TM Distance: >3 FB Neck ROM: Full    Dental  (+) Dental Advisory Given, Edentulous Upper   Pulmonary Current Smoker and Patient abstained from smoking.   Pulmonary exam normal breath sounds clear to auscultation       Cardiovascular negative cardio ROS Normal cardiovascular exam Rhythm:Regular Rate:Normal     Neuro/Psych  Headaches PSYCHIATRIC DISORDERS Anxiety Depression     Neuromuscular disease    GI/Hepatic Neg liver ROS, PUD,GERD  ,,  Endo/Other  negative endocrine ROS    Renal/GU negative Renal ROS     Musculoskeletal negative musculoskeletal ROS (+)    Abdominal   Peds  Hematology negative hematology ROS (+)   Anesthesia Other Findings Day of surgery medications reviewed with the patient.  Reproductive/Obstetrics                              Anesthesia Physical Anesthesia Plan  ASA: 2  Anesthesia Plan: General   Post-op Pain Management: Tylenol  PO (pre-op)*   Induction: Intravenous  PONV Risk Score and Plan: 3 and Midazolam , Scopolamine  patch - Pre-op, Dexamethasone  and Ondansetron   Airway Management Planned: Oral ETT and Video Laryngoscope Planned  Additional Equipment:   Intra-op Plan:   Post-operative Plan: Extubation in OR  Informed Consent: I have reviewed the patients History and Physical, chart, labs and discussed the procedure including the risks, benefits and alternatives for the proposed anesthesia with the patient or authorized representative who has indicated his/her understanding and acceptance.     Dental advisory given  Plan Discussed with:  CRNA  Anesthesia Plan Comments: (PAT note written 04/14/2024 by Allison Zelenak, PA-C.  )         Anesthesia Quick Evaluation

## 2024-04-22 ENCOUNTER — Ambulatory Visit (HOSPITAL_COMMUNITY): Admitting: Anesthesiology

## 2024-04-22 ENCOUNTER — Other Ambulatory Visit: Payer: Self-pay

## 2024-04-22 ENCOUNTER — Ambulatory Visit (HOSPITAL_COMMUNITY)

## 2024-04-22 ENCOUNTER — Ambulatory Visit (HOSPITAL_COMMUNITY)
Admission: RE | Admit: 2024-04-22 | Discharge: 2024-04-23 | Disposition: A | Discharge status: Home / Self Care | Attending: Neurosurgery | Admitting: Neurosurgery

## 2024-04-22 ENCOUNTER — Encounter (HOSPITAL_COMMUNITY)
Admission: RE | Disposition: A | Discharge status: Home / Self Care | Payer: Self-pay | Source: Home / Self Care | Attending: Neurosurgery

## 2024-04-22 ENCOUNTER — Ambulatory Visit (HOSPITAL_COMMUNITY): Payer: Self-pay | Admitting: Vascular Surgery

## 2024-04-22 DIAGNOSIS — Z79624 Long term (current) use of inhibitors of nucleotide synthesis: Secondary | ICD-10-CM | POA: Insufficient documentation

## 2024-04-22 DIAGNOSIS — M5412 Radiculopathy, cervical region: Secondary | ICD-10-CM | POA: Diagnosis not present

## 2024-04-22 DIAGNOSIS — Z79899 Other long term (current) drug therapy: Secondary | ICD-10-CM | POA: Diagnosis not present

## 2024-04-22 DIAGNOSIS — F17210 Nicotine dependence, cigarettes, uncomplicated: Secondary | ICD-10-CM | POA: Insufficient documentation

## 2024-04-22 DIAGNOSIS — R202 Paresthesia of skin: Secondary | ICD-10-CM | POA: Diagnosis not present

## 2024-04-22 DIAGNOSIS — F32A Depression, unspecified: Secondary | ICD-10-CM | POA: Diagnosis not present

## 2024-04-22 DIAGNOSIS — G8929 Other chronic pain: Secondary | ICD-10-CM | POA: Diagnosis not present

## 2024-04-22 DIAGNOSIS — M4722 Other spondylosis with radiculopathy, cervical region: Secondary | ICD-10-CM | POA: Diagnosis not present

## 2024-04-22 DIAGNOSIS — K279 Peptic ulcer, site unspecified, unspecified as acute or chronic, without hemorrhage or perforation: Secondary | ICD-10-CM | POA: Diagnosis not present

## 2024-04-22 DIAGNOSIS — M4802 Spinal stenosis, cervical region: Secondary | ICD-10-CM | POA: Diagnosis not present

## 2024-04-22 DIAGNOSIS — R200 Anesthesia of skin: Secondary | ICD-10-CM | POA: Insufficient documentation

## 2024-04-22 DIAGNOSIS — R519 Headache, unspecified: Secondary | ICD-10-CM | POA: Diagnosis not present

## 2024-04-22 DIAGNOSIS — K219 Gastro-esophageal reflux disease without esophagitis: Secondary | ICD-10-CM | POA: Insufficient documentation

## 2024-04-22 DIAGNOSIS — M542 Cervicalgia: Secondary | ICD-10-CM | POA: Insufficient documentation

## 2024-04-22 DIAGNOSIS — F419 Anxiety disorder, unspecified: Secondary | ICD-10-CM | POA: Diagnosis not present

## 2024-04-22 DIAGNOSIS — Z981 Arthrodesis status: Secondary | ICD-10-CM | POA: Diagnosis not present

## 2024-04-22 DIAGNOSIS — M419 Scoliosis, unspecified: Secondary | ICD-10-CM | POA: Insufficient documentation

## 2024-04-22 HISTORY — PX: ANTERIOR CERVICAL DECOMP/DISCECTOMY FUSION: SHX1161

## 2024-04-22 LAB — POCT PREGNANCY, URINE: Preg Test, Ur: NEGATIVE

## 2024-04-22 SURGERY — ANTERIOR CERVICAL DECOMPRESSION/DISCECTOMY FUSION 1 LEVEL
Anesthesia: General

## 2024-04-22 MED ORDER — ACETAMINOPHEN 650 MG RE SUPP: 650.0000 mg | RECTAL | Status: DC | PRN

## 2024-04-22 MED ORDER — METRONIDAZOLE 500 MG PO TABS
500.0000 mg | ORAL_TABLET | Freq: Two times a day (BID) | ORAL | Status: DC
  Administered 2024-04-22: 500 mg via ORAL
  Filled 2024-04-22 (×2): qty 1

## 2024-04-22 MED ORDER — CHLORHEXIDINE GLUCONATE CLOTH 2 % EX PADS: 6.0000 | MEDICATED_PAD | Freq: Once | CUTANEOUS | Status: DC

## 2024-04-22 MED ORDER — CEFAZOLIN SODIUM-DEXTROSE 2-4 GM/100ML-% IV SOLN
2.0000 g | INTRAVENOUS | Status: AC
  Administered 2024-04-22: 2 g via INTRAVENOUS
  Filled 2024-04-22: qty 100

## 2024-04-22 MED ORDER — LIDOCAINE 2% (20 MG/ML) 5 ML SYRINGE
INTRAMUSCULAR | Status: DC | PRN
  Administered 2024-04-22: 80 mg via INTRAVENOUS

## 2024-04-22 MED ORDER — FENTANYL CITRATE (PF) 250 MCG/5ML IJ SOLN
INTRAMUSCULAR | Status: AC
  Filled 2024-04-22: qty 5

## 2024-04-22 MED ORDER — ALBUTEROL SULFATE HFA 108 (90 BASE) MCG/ACT IN AERS
INHALATION_SPRAY | RESPIRATORY_TRACT | Status: DC | PRN
  Administered 2024-04-22: 4 via RESPIRATORY_TRACT

## 2024-04-22 MED ORDER — PROPOFOL 10 MG/ML IV BOLUS
INTRAVENOUS | Status: DC | PRN
  Administered 2024-04-22: 40 mg via INTRAVENOUS
  Administered 2024-04-22: 130 mg via INTRAVENOUS

## 2024-04-22 MED ORDER — OXYCODONE HCL 5 MG PO TABS: 5.0000 mg | ORAL_TABLET | ORAL | Status: DC | PRN

## 2024-04-22 MED ORDER — LACTATED RINGERS IV SOLN: INTRAVENOUS | Status: DC

## 2024-04-22 MED ORDER — THROMBIN 5000 UNITS EX SOLR
OROMUCOSAL | Status: DC | PRN
  Administered 2024-04-22: 5 mL via TOPICAL

## 2024-04-22 MED ORDER — UMECLIDINIUM BROMIDE 62.5 MCG/ACT IN AEPB: 2.0000 | INHALATION_SPRAY | Freq: Every day | RESPIRATORY_TRACT | Status: DC | PRN

## 2024-04-22 MED ORDER — FENTANYL CITRATE (PF) 100 MCG/2ML IJ SOLN
INTRAMUSCULAR | Status: AC
Start: 2024-04-22 — End: 2024-04-22
  Filled 2024-04-22: qty 2

## 2024-04-22 MED ORDER — ROCURONIUM BROMIDE 10 MG/ML (PF) SYRINGE
PREFILLED_SYRINGE | INTRAVENOUS | Status: DC | PRN
  Administered 2024-04-22: 50 mg via INTRAVENOUS
  Administered 2024-04-22: 30 mg via INTRAVENOUS
  Administered 2024-04-22: 20 mg via INTRAVENOUS

## 2024-04-22 MED ORDER — OXYCODONE HCL 5 MG PO TABS
ORAL_TABLET | ORAL | Status: AC
  Filled 2024-04-22: qty 2

## 2024-04-22 MED ORDER — ONDANSETRON HCL 4 MG PO TABS: 4.0000 mg | ORAL_TABLET | Freq: Three times a day (TID) | ORAL | Status: DC | PRN

## 2024-04-22 MED ORDER — LACTATED RINGERS IV SOLN: INTRAVENOUS | Status: DC | PRN

## 2024-04-22 MED ORDER — PROPOFOL 500 MG/50ML IV EMUL
INTRAVENOUS | Status: DC | PRN
  Administered 2024-04-22: 30 ug/kg/min via INTRAVENOUS

## 2024-04-22 MED ORDER — SUCRALFATE 1 G PO TABS
1.0000 g | ORAL_TABLET | Freq: Three times a day (TID) | ORAL | Status: DC
  Administered 2024-04-23: 1 g via ORAL
  Filled 2024-04-22 (×2): qty 1

## 2024-04-22 MED ORDER — BACITRACIN ZINC 500 UNIT/GM EX OINT
TOPICAL_OINTMENT | CUTANEOUS | Status: AC
Start: 2024-04-22 — End: 2024-04-22
  Filled 2024-04-22: qty 28.35

## 2024-04-22 MED ORDER — HYDROMORPHONE HCL 1 MG/ML IJ SOLN
INTRAMUSCULAR | Status: AC
  Filled 2024-04-22: qty 1

## 2024-04-22 MED ORDER — CEFAZOLIN SODIUM-DEXTROSE 2-4 GM/100ML-% IV SOLN
2.0000 g | Freq: Three times a day (TID) | INTRAVENOUS | Status: AC
  Administered 2024-04-22 – 2024-04-23 (×2): 2 g via INTRAVENOUS
  Filled 2024-04-22 (×2): qty 100

## 2024-04-22 MED ORDER — SCOPOLAMINE 1 MG/3DAYS TD PT72
1.0000 | MEDICATED_PATCH | Freq: Once | TRANSDERMAL | Status: DC
  Administered 2024-04-22: 1.5 mg via TRANSDERMAL
  Filled 2024-04-22: qty 1

## 2024-04-22 MED ORDER — PHENOL 1.4 % MT LIQD: 1.0000 | OROMUCOSAL | Status: DC | PRN

## 2024-04-22 MED ORDER — GABAPENTIN 300 MG PO CAPS
300.0000 mg | ORAL_CAPSULE | Freq: Two times a day (BID) | ORAL | Status: DC
Start: 2024-04-22 — End: 2024-04-23
  Administered 2024-04-22 – 2024-04-23 (×2): 300 mg via ORAL
  Filled 2024-04-22 (×2): qty 1

## 2024-04-22 MED ORDER — THROMBIN 5000 UNITS EX KIT
PACK | CUTANEOUS | Status: AC
Start: 2024-04-22 — End: 2024-04-22
  Filled 2024-04-22: qty 1

## 2024-04-22 MED ORDER — FENTANYL CITRATE (PF) 100 MCG/2ML IJ SOLN
INTRAMUSCULAR | Status: AC
  Filled 2024-04-22: qty 2

## 2024-04-22 MED ORDER — PANTOPRAZOLE SODIUM 40 MG PO TBEC
40.0000 mg | DELAYED_RELEASE_TABLET | Freq: Every day | ORAL | Status: DC
  Administered 2024-04-23: 40 mg via ORAL
  Filled 2024-04-22: qty 1

## 2024-04-22 MED ORDER — PRIMIDONE 50 MG PO TABS
50.0000 mg | ORAL_TABLET | Freq: Every day | ORAL | Status: DC
  Administered 2024-04-22: 50 mg via ORAL
  Filled 2024-04-22: qty 1

## 2024-04-22 MED ORDER — BISACODYL 10 MG RE SUPP: 10.0000 mg | Freq: Every day | RECTAL | Status: DC | PRN

## 2024-04-22 MED ORDER — ACETAMINOPHEN 500 MG PO TABS
1000.0000 mg | ORAL_TABLET | Freq: Once | ORAL | Status: AC
  Administered 2024-04-22: 1000 mg via ORAL
  Filled 2024-04-22: qty 2

## 2024-04-22 MED ORDER — OXYCODONE HCL 5 MG PO TABS
10.0000 mg | ORAL_TABLET | ORAL | Status: DC | PRN
  Administered 2024-04-22 – 2024-04-23 (×5): 10 mg via ORAL
  Filled 2024-04-22 (×4): qty 2

## 2024-04-22 MED ORDER — ONDANSETRON HCL 4 MG/2ML IJ SOLN
INTRAMUSCULAR | Status: DC | PRN
  Administered 2024-04-22: 4 mg via INTRAVENOUS

## 2024-04-22 MED ORDER — BUPIVACAINE-EPINEPHRINE (PF) 0.5% -1:200000 IJ SOLN
INTRAMUSCULAR | Status: AC
  Filled 2024-04-22: qty 30

## 2024-04-22 MED ORDER — FENTANYL CITRATE (PF) 100 MCG/2ML IJ SOLN
25.0000 ug | INTRAMUSCULAR | Status: DC | PRN
  Administered 2024-04-22 (×3): 50 ug via INTRAVENOUS

## 2024-04-22 MED ORDER — TRAZODONE HCL 50 MG PO TABS
100.0000 mg | ORAL_TABLET | Freq: Every day | ORAL | Status: DC
Start: 2024-04-22 — End: 2024-04-23
  Administered 2024-04-22: 100 mg via ORAL
  Filled 2024-04-22: qty 2

## 2024-04-22 MED ORDER — ALUM & MAG HYDROXIDE-SIMETH 200-200-20 MG/5ML PO SUSP: 30.0000 mL | Freq: Four times a day (QID) | ORAL | Status: DC | PRN

## 2024-04-22 MED ORDER — ONDANSETRON HCL 4 MG/2ML IJ SOLN: 4.0000 mg | Freq: Once | INTRAMUSCULAR | Status: DC | PRN

## 2024-04-22 MED ORDER — BACITRACIN ZINC 500 UNIT/GM EX OINT
TOPICAL_OINTMENT | CUTANEOUS | Status: DC | PRN
Start: 2024-04-22 — End: 2024-04-22
  Administered 2024-04-22: 1 via TOPICAL

## 2024-04-22 MED ORDER — DULOXETINE HCL 60 MG PO CPEP
60.0000 mg | ORAL_CAPSULE | Freq: Every day | ORAL | Status: DC
  Administered 2024-04-22 – 2024-04-23 (×2): 60 mg via ORAL
  Filled 2024-04-22 (×2): qty 1

## 2024-04-22 MED ORDER — LUBIPROSTONE 24 MCG PO CAPS
24.0000 ug | ORAL_CAPSULE | Freq: Two times a day (BID) | ORAL | Status: DC
  Administered 2024-04-23: 24 ug via ORAL
  Filled 2024-04-22: qty 1

## 2024-04-22 MED ORDER — ORAL CARE MOUTH RINSE: 15.0000 mL | Freq: Once | OROMUCOSAL | Status: AC

## 2024-04-22 MED ORDER — CHLORHEXIDINE GLUCONATE 0.12 % MT SOLN
15.0000 mL | Freq: Once | OROMUCOSAL | Status: AC
  Administered 2024-04-22: 15 mL via OROMUCOSAL
  Filled 2024-04-22: qty 15

## 2024-04-22 MED ORDER — MENTHOL 3 MG MT LOZG
1.0000 | LOZENGE | OROMUCOSAL | Status: DC | PRN
  Filled 2024-04-22: qty 9

## 2024-04-22 MED ORDER — BUPIVACAINE-EPINEPHRINE (PF) 0.5% -1:200000 IJ SOLN
INTRAMUSCULAR | Status: DC | PRN
  Administered 2024-04-22: 10 mL via PERINEURAL

## 2024-04-22 MED ORDER — MORPHINE SULFATE (PF) 2 MG/ML IV SOLN: 2.0000 mg | INTRAVENOUS | Status: DC | PRN

## 2024-04-22 MED ORDER — METHOCARBAMOL 500 MG PO TABS
ORAL_TABLET | ORAL | Status: AC
  Filled 2024-04-22: qty 2

## 2024-04-22 MED ORDER — DEXAMETHASONE SODIUM PHOSPHATE 10 MG/ML IJ SOLN
INTRAMUSCULAR | Status: DC | PRN
  Administered 2024-04-22: 10 mg via INTRAVENOUS

## 2024-04-22 MED ORDER — SULFAMETHOXAZOLE-TRIMETHOPRIM 800-160 MG PO TABS
1.0000 | ORAL_TABLET | Freq: Two times a day (BID) | ORAL | Status: DC
  Administered 2024-04-22 – 2024-04-23 (×2): 1 via ORAL
  Filled 2024-04-22 (×2): qty 1

## 2024-04-22 MED ORDER — THROMBIN 5000 UNITS EX KIT
PACK | CUTANEOUS | Status: AC
  Filled 2024-04-22: qty 1

## 2024-04-22 MED ORDER — DOCUSATE SODIUM 100 MG PO CAPS
100.0000 mg | ORAL_CAPSULE | Freq: Two times a day (BID) | ORAL | Status: DC
  Administered 2024-04-22 – 2024-04-23 (×2): 100 mg via ORAL
  Filled 2024-04-22 (×2): qty 1

## 2024-04-22 MED ORDER — PHENYLEPHRINE 80 MCG/ML (10ML) SYRINGE FOR IV PUSH (FOR BLOOD PRESSURE SUPPORT)
PREFILLED_SYRINGE | INTRAVENOUS | Status: DC | PRN
  Administered 2024-04-22 (×4): 80 ug via INTRAVENOUS
  Administered 2024-04-22: 160 ug via INTRAVENOUS

## 2024-04-22 MED ORDER — 0.9 % SODIUM CHLORIDE (POUR BTL) OPTIME
TOPICAL | Status: DC | PRN
  Administered 2024-04-22: 1000 mL

## 2024-04-22 MED ORDER — DEXAMETHASONE SODIUM PHOSPHATE 4 MG/ML IJ SOLN
4.0000 mg | Freq: Four times a day (QID) | INTRAMUSCULAR | Status: AC
  Administered 2024-04-22 – 2024-04-23 (×2): 4 mg via INTRAVENOUS
  Filled 2024-04-22 (×2): qty 1

## 2024-04-22 MED ORDER — FENTANYL CITRATE (PF) 250 MCG/5ML IJ SOLN
INTRAMUSCULAR | Status: DC | PRN
  Administered 2024-04-22 (×2): 50 ug via INTRAVENOUS
  Administered 2024-04-22: 100 ug via INTRAVENOUS
  Administered 2024-04-22: 50 ug via INTRAVENOUS
  Administered 2024-04-22: 100 ug via INTRAVENOUS
  Administered 2024-04-22 (×3): 50 ug via INTRAVENOUS

## 2024-04-22 MED ORDER — ZOLPIDEM TARTRATE 5 MG PO TABS: 5.0000 mg | ORAL_TABLET | Freq: Every evening | ORAL | Status: DC | PRN

## 2024-04-22 MED ORDER — SUGAMMADEX SODIUM 200 MG/2ML IV SOLN
INTRAVENOUS | Status: DC | PRN
  Administered 2024-04-22: 200 mg via INTRAVENOUS

## 2024-04-22 MED ORDER — CLONAZEPAM 1 MG PO TABS
1.0000 mg | ORAL_TABLET | Freq: Four times a day (QID) | ORAL | Status: DC | PRN
  Administered 2024-04-22 – 2024-04-23 (×2): 1 mg via ORAL
  Filled 2024-04-22 (×2): qty 1

## 2024-04-22 MED ORDER — HYDROMORPHONE HCL 1 MG/ML IJ SOLN
0.2500 mg | INTRAMUSCULAR | Status: DC | PRN
  Administered 2024-04-22 (×4): 0.5 mg via INTRAVENOUS

## 2024-04-22 MED ORDER — CARIPRAZINE HCL 1.5 MG PO CAPS
1.5000 mg | ORAL_CAPSULE | Freq: Every evening | ORAL | Status: DC
  Administered 2024-04-22: 1.5 mg via ORAL
  Filled 2024-04-22: qty 1

## 2024-04-22 MED ORDER — METHOCARBAMOL 750 MG PO TABS
750.0000 mg | ORAL_TABLET | Freq: Three times a day (TID) | ORAL | Status: DC | PRN
  Administered 2024-04-22 – 2024-04-23 (×2): 750 mg via ORAL
  Filled 2024-04-22 (×2): qty 1

## 2024-04-22 MED ORDER — AMISULPRIDE (ANTIEMETIC) 5 MG/2ML IV SOLN: 10.0000 mg | Freq: Once | INTRAVENOUS | Status: DC | PRN

## 2024-04-22 MED ORDER — DEXAMETHASONE 4 MG PO TABS: 4.0000 mg | ORAL_TABLET | Freq: Four times a day (QID) | ORAL | Status: AC

## 2024-04-22 MED ORDER — PROMETHAZINE HCL 12.5 MG RE SUPP: 12.5000 mg | Freq: Three times a day (TID) | RECTAL | Status: DC | PRN

## 2024-04-22 MED ORDER — ACETAMINOPHEN 325 MG PO TABS: 650.0000 mg | ORAL_TABLET | ORAL | Status: DC | PRN

## 2024-04-22 MED ORDER — ACETAMINOPHEN 500 MG PO TABS
1000.0000 mg | ORAL_TABLET | Freq: Four times a day (QID) | ORAL | Status: DC
  Administered 2024-04-22 – 2024-04-23 (×3): 1000 mg via ORAL
  Filled 2024-04-22 (×3): qty 2

## 2024-04-22 SURGICAL SUPPLY — 49 items
BAG COUNTER SPONGE SURGICOUNT (BAG) ×1 IMPLANT
BAND RUBBER #18 3X1/16 STRL (MISCELLANEOUS) ×2 IMPLANT
BENZOIN TINCTURE PRP APPL 2/3 (GAUZE/BANDAGES/DRESSINGS) ×1 IMPLANT
BIT DRILL NEURO 2X3.1 SFT TUCH (MISCELLANEOUS) ×1 IMPLANT
BLADE SURG 15 STRL LF DISP TIS (BLADE) ×1 IMPLANT
BLADE ULTRA TIP 2M (BLADE) ×1 IMPLANT
BUR BARREL STRAIGHT FLUTE 4.0 (BURR) ×1 IMPLANT
BUR MATCHSTICK NEURO 3.0 LAGG (BURR) ×1 IMPLANT
CAGE PEEK VISTAS 11X14X6 (Cage) IMPLANT
CANISTER SUCTION 3000ML PPV (SUCTIONS) ×1 IMPLANT
COVER MAYO STAND STRL (DRAPES) ×1 IMPLANT
DRAPE LAPAROTOMY 100X72 PEDS (DRAPES) ×1 IMPLANT
DRAPE MICROSCOPE SLANT 54X150 (MISCELLANEOUS) IMPLANT
DRAPE SURG 17X23 STRL (DRAPES) ×2 IMPLANT
DRSG OPSITE POSTOP 3X4 (GAUZE/BANDAGES/DRESSINGS) ×1 IMPLANT
ELECTRODE REM PT RTRN 9FT ADLT (ELECTROSURGICAL) ×1 IMPLANT
GAUZE 4X4 16PLY ~~LOC~~+RFID DBL (SPONGE) IMPLANT
GLOVE BIO SURGEON STRL SZ 6.5 (GLOVE) ×1 IMPLANT
GLOVE BIO SURGEON STRL SZ8 (GLOVE) ×1 IMPLANT
GLOVE BIO SURGEON STRL SZ8.5 (GLOVE) ×1 IMPLANT
GLOVE BIOGEL PI IND STRL 6.5 (GLOVE) ×1 IMPLANT
GLOVE BIOGEL PI IND STRL 7.5 (GLOVE) IMPLANT
GLOVE EXAM NITRILE XL STR (GLOVE) IMPLANT
GLOVE SURG SS PI 7.0 STRL IVOR (GLOVE) IMPLANT
GOWN STRL REUS W/ TWL LRG LVL3 (GOWN DISPOSABLE) ×1 IMPLANT
GOWN STRL REUS W/ TWL XL LVL3 (GOWN DISPOSABLE) ×1 IMPLANT
HEMOSTAT POWDER KIT SURGIFOAM (HEMOSTASIS) ×1 IMPLANT
KIT BASIN OR (CUSTOM PROCEDURE TRAY) ×1 IMPLANT
KIT TURNOVER KIT B (KITS) ×1 IMPLANT
MARKER SKIN DUAL TIP RULER LAB (MISCELLANEOUS) ×1 IMPLANT
NDL HYPO 22X1.5 SAFETY MO (MISCELLANEOUS) ×1 IMPLANT
NDL SPNL 18GX3.5 QUINCKE PK (NEEDLE) ×1 IMPLANT
NEEDLE HYPO 22X1.5 SAFETY MO (MISCELLANEOUS) ×1 IMPLANT
NEEDLE SPNL 18GX3.5 QUINCKE PK (NEEDLE) ×1 IMPLANT
NS IRRIG 1000ML POUR BTL (IV SOLUTION) ×1 IMPLANT
PACK LAMINECTOMY NEURO (CUSTOM PROCEDURE TRAY) ×1 IMPLANT
PIN DISTRACTION 14MM (PIN) ×2 IMPLANT
PLATE ANT CERV XTEND LD 1 L10 (Plate) IMPLANT
PUTTY DBM 2CC CALC GRAN (Putty) IMPLANT
SCREW XTD VAR 4.2 SELF TAP 12 (Screw) IMPLANT
SPIKE FLUID TRANSFER (MISCELLANEOUS) ×1 IMPLANT
SPONGE INTESTINAL PEANUT (DISPOSABLE) ×2 IMPLANT
SPONGE SURGIFOAM ABS GEL SZ50 (HEMOSTASIS) IMPLANT
STRIP CLOSURE SKIN 1/2X4 (GAUZE/BANDAGES/DRESSINGS) ×1 IMPLANT
SUT VIC AB 0 CT1 27XBRD ANTBC (SUTURE) ×1 IMPLANT
SUT VIC AB 3-0 SH 8-18 (SUTURE) ×1 IMPLANT
TOWEL GREEN STERILE (TOWEL DISPOSABLE) ×1 IMPLANT
TOWEL GREEN STERILE FF (TOWEL DISPOSABLE) ×1 IMPLANT
WATER STERILE IRR 1000ML POUR (IV SOLUTION) ×1 IMPLANT

## 2024-04-22 NOTE — Op Note (Signed)
 Brief history: The patient is a 33 year old white female who has complained of neck and left shoulder/arm pain consistent with a cervical radiculopathy.  She has failed medical management and was worked up with a cervical MRI which demonstrated spondylosis and foraminal stenosis at C3-4.  I discussed the various treatment options with her.  She has decided proceed with surgery.  Preoperative diagnosis: Cervical spondylosis, cervical radiculopathy, cervicalgia, cervical neuroforaminal stenosis  Postoperative diagnosis: As above  Procedure: C3-4 anterior cervical discectomy/decompression; C3-4 interbody arthrodesis with local morcellized autograft bone and Zimmer DBM; insertion of interbody prosthesis at C3-4 (Zimmer peek interbody prosthesis); anterior cervical plating from C3-4 no we will get with globus titanium plate  Surgeon: Dr. Chyrl Budge  Asst.: Duwaine Beck, NP  Anesthesia: Gen. endotracheal  Estimated blood loss: 75 cc  Drains: None  Complications: None  Description of procedure: The patient was brought to the operating room by the anesthesia team. General endotracheal anesthesia was induced. A roll was placed under the patient's shoulders to keep the neck in the neutral position. The patient's anterior cervical region was then prepared with Betadine  scrub and Betadine  solution. Sterile drapes were applied.  The area to be incised was then injected with Marcaine  with epinephrine  solution. I then used a scalpel to make a transverse incision in the patient's left anterior neck. I used the Metzenbaum scissors to divide the platysmal muscle and then to dissect medial to the sternocleidomastoid muscle, jugular vein, and carotid artery. I carefully dissected down towards the anterior cervical spine identifying the esophagus and retracting it medially. Then using Kitner swabs to clear soft tissue from the anterior cervical spine. We then inserted a bent spinal needle into the upper exposed  intervertebral disc space. We then obtained intraoperative radiographs confirm our location.  I then used electrocautery to detach the medial border of the longus colli muscle bilaterally from the C3-4 intervertebral disc spaces. I then inserted the Caspar self-retaining retractor underneath the longus colli muscle bilaterally to provide exposure.  We then incised the intervertebral disc at C3-4. We then performed a partial intervertebral discectomy with a pituitary forceps and the Karlin curettes. I then inserted distraction screws into the vertebral bodies at C3-4. We then distracted the interspace. We then used the high-speed drill to decorticate the vertebral endplates at C3-4, to drill away the remainder of the intervertebral disc, to drill away some posterior spondylosis, and to thin out the posterior longitudinal ligament. I then incised ligament with the arachnoid knife. We then removed the ligament with a Kerrison punches undercutting the vertebral endplates and decompressing the thecal sac. We then performed foraminotomies about the bilateral C4 nerve roots. This completed the decompression at this level.  We now turned our to attention to the interbody fusion. We used the trial spacers to determine the appropriate size for the interbody prosthesis. We then pre-filled prosthesis with a combination of local morcellized autograft bone that we obtained during decompression as well as Zimmer DBM. We then inserted the prosthesis into the distracted interspace at C3-4. We then removed the distraction screws. There was a good snug fit of the prosthesis in the interspace.  Having completed the fusion we now turned attention to the anterior spinal instrumentation. We used the high-speed drill to drill away some anterior spondylosis at the disc spaces so that the plate lay down flat. We selected the appropriate length titanium anterior cervical plate. We laid it along the anterior aspect of the vertebral  bodies from C3-4. We then drilled 12  mm holes at C3 and C4. We then secured the plate to the vertebral bodies by placing two 12 mm self-tapping screws at C3 and C4. We then obtained intraoperative radiograph. The demonstrating good position of the instrumentation. We therefore secured the screws the plate the locking each cam. This completed the instrumentation.  We then obtained hemostasis using bipolar electrocautery. We irrigated the wound out with bacitracin  solution. We then removed the retractor. We inspected the esophagus for any damage. There was none apparent. We then reapproximated patient's platysmal muscle with interrupted 3-0 Vicryl suture. We then reapproximated the subcutaneous tissue with interrupted 3-0 Vicryl suture. The skin was reapproximated with Steri-Strips and benzoin. The wound was then covered with bacitracin  ointment. A sterile dressing was applied. The drapes were removed. Patient was subsequently extubated by the anesthesia team and transported to the post anesthesia care unit in stable condition. All sponge instrument and needle counts were reportedly correct at the end of this case.

## 2024-04-22 NOTE — Anesthesia Procedure Notes (Signed)
 Procedure Name: Intubation Date/Time: 04/22/2024 12:17 PM  Performed by: Scherrie Mast, CRNAPre-anesthesia Checklist: Patient identified, Emergency Drugs available, Suction available and Patient being monitored Patient Re-evaluated:Patient Re-evaluated prior to induction Oxygen  Delivery Method: Circle System Utilized Preoxygenation: Pre-oxygenation with 100% oxygen  Induction Type: IV induction Ventilation: Mask ventilation without difficulty Laryngoscope Size: Glidescope, 3 and Mac Grade View: Grade I Tube type: Oral Number of attempts: 1 Airway Equipment and Method: Stylet and Oral airway Placement Confirmation: ETT inserted through vocal cords under direct vision, positive ETCO2 and breath sounds checked- equal and bilateral Secured at: 21 cm Tube secured with: Tape Dental Injury: Teeth and Oropharynx as per pre-operative assessment  Comments: DL w VAL, no e/o aspiration or regurgitation

## 2024-04-22 NOTE — Progress Notes (Signed)
 Orthopedic Tech Progress Note Patient Details:  Debbie Santana [birth year] [id number]  Ortho Devices Type of Ortho Device: Aspen cervical collar Ortho Device/Splint Location: delivered to bedside of PACU 4 Ortho Device/Splint Interventions: Ordered      [name] 04/22/2024, 3:21 PM

## 2024-04-22 NOTE — Transfer of Care (Signed)
 Immediate Anesthesia Transfer of Care Note  Patient: Debbie Santana  Procedure(s) Performed: ANTERIOR CERVICAL DECOMPRESSION/DISCECTOMY FUSION CERVICAL THREE-CERVICAL FOUR  PLATE/SCREWS  Patient Location: PACU  Anesthesia Type:General  Level of Consciousness: awake, alert , and oriented  Airway & Oxygen  Therapy: Patient Spontanous Breathing and Patient connected to nasal cannula oxygen   Post-op Assessment: Report given to RN and Post -op Vital signs reviewed and stable  Post vital signs: Reviewed and stable  Last Vitals:  Vitals Value Taken Time  BP 125/61 04/22/24 14:41  Temp 36.4   Pulse 71 04/22/24 14:43  Resp 14 04/22/24 14:43  SpO2 94 % 04/22/24 14:43  Vitals shown include unfiled device data.  Last Pain:  Vitals:   04/22/24 1037  TempSrc:   PainSc: 7       Patients Stated Pain Goal: 2 (04/22/24 1037)  Complications: No notable events documented.

## 2024-04-22 NOTE — H&P (Signed)
 Subjective: The patient is a 33 year old white female who has complained of neck pain with numbness and tingling in her left arm and hand.  She has failed medical management.  She was worked up with a cervical MRI and cervical x-rays which demonstrated spondylosis and stenosis most prominent at C3-4.  I discussed the various treatment options with her.  She has decided proceed with surgery.  Past Medical History:  Diagnosis Date   Anxiety    Bacterial vaginosis    Bradycardia 04/17/2017   Chest pain on breathing 02/22/2020   Chronic back pain    Depression    Erosive gastritis 04/11/2017   Gastric ulcer    Genital warts    GERD (gastroesophageal reflux disease)    High cholesterol    History of gestational hypertension 05/21/2018   Dx intrapartum   History of kidney stones    Intertrigo 11/16/2019   Intractable abdominal pain 04/11/2017   Intractable nausea and vomiting 12/07/2018   Intractable vomiting 05/20/2020   Irregular intermenstrual bleeding 02/19/2019   Mental disorder    Migraine without aura and without status migrainosus, not intractable 02/19/2019   Nausea 05/10/2020   Nausea & vomiting 12/07/2018   Nexplanon  in place 02/19/2019   Obesity 04/17/2017   Panic disorder    Peptic ulcer disease 04/13/2017   Pinched nerve 09/24/2019   PONV (postoperative nausea and vomiting)    Sciatica    Scoliosis    Tremor of both hands    Trichomoniasis    Vaginal Pap smear, abnormal    Wheezing     Past Surgical History:  Procedure Laterality Date   BACK SURGERY     ruptured disc.   BACK SURGERY  11/2021   BIOPSY  04/14/2017   Procedure: BIOPSY;  Surgeon: Golda Claudis PENNER, MD;  [facility]: [facility];  Service: Endoscopy;;  gastric   BIOPSY  05/22/2020   Procedure: BIOPSY;  Surgeon: Shaaron Lamar HERO, MD;  Location: [facility];  Service: Endoscopy;;   BIOPSY  08/29/2020   Procedure: BIOPSY;  Surgeon: Eartha Angelia Sieving, MD;  Location: [facility];  Service:  Gastroenterology;;   BIOPSY  09/09/2020   Procedure: BIOPSY;  Surgeon: Cindie Carlin POUR, DO;  Location: [facility];  Service: Endoscopy;;  duodenum;antral   BIOPSY  11/21/2021   Procedure: BIOPSY;  Surgeon: Golda Claudis PENNER, MD;  [facility]: [facility];  Service: Endoscopy;;   BIOPSY  03/06/2023   Procedure: BIOPSY;  Surgeon: Eartha Angelia Sieving, MD;  Location: [facility];  Service: Gastroenterology;;   CERVICAL ABLATION N/A 12/02/2018   Procedure: LASER ABLATION OF CERVIX;  Surgeon: Jayne Vonn DEL, MD;  Location: [facility];  Service: Gynecology;  Laterality: N/A;   CHOLECYSTECTOMY N/A 04/17/2017   Procedure: LAPAROSCOPIC CHOLECYSTECTOMY;  Surgeon: Mavis Anes, MD;  Location: [facility];  Service: General;  Laterality: N/A;   COLONOSCOPY WITH PROPOFOL  N/A 08/29/2020   Procedure: COLONOSCOPY WITH PROPOFOL ;  Surgeon: Eartha Angelia Sieving, MD;  [facility]: [facility];  Service: Gastroenterology;  Laterality: N/A;  11:15   COLPOSCOPY     ESOPHAGOGASTRODUODENOSCOPY (EGD) WITH PROPOFOL  N/A 04/14/2017   Procedure: ESOPHAGOGASTRODUODENOSCOPY (EGD) WITH PROPOFOL ;  Surgeon: Golda Claudis PENNER, MD;  [facility]: [facility];  Service: Endoscopy;  Laterality: N/A;   ESOPHAGOGASTRODUODENOSCOPY (EGD) WITH PROPOFOL  N/A 05/22/2020   Procedure: ESOPHAGOGASTRODUODENOSCOPY (EGD) WITH PROPOFOL ;  Surgeon: Shaaron Lamar HERO, MD;  [facility];  Service: Endoscopy;  Laterality: N/A;   ESOPHAGOGASTRODUODENOSCOPY (EGD) WITH PROPOFOL  N/A 09/09/2020  Procedure: ESOPHAGOGASTRODUODENOSCOPY (EGD) WITH PROPOFOL ;  Surgeon: Cindie Carlin POUR, DO;  [facility];  Service: Endoscopy;  Laterality: N/A;   ESOPHAGOGASTRODUODENOSCOPY (EGD) WITH PROPOFOL  N/A 11/21/2020   Procedure: ESOPHAGOGASTRODUODENOSCOPY (EGD) WITH PROPOFOL ;  Surgeon: Eartha Angelia Sieving, MD;  [facility];  Service: Gastroenterology;  Laterality: N/A;  Am   ESOPHAGOGASTRODUODENOSCOPY (EGD) WITH PROPOFOL  N/A  11/21/2021   Procedure: ESOPHAGOGASTRODUODENOSCOPY (EGD) WITH PROPOFOL ;  Surgeon: Golda Claudis PENNER, MD;  [facility];  Service: Endoscopy;  Laterality: N/A;  820   ESOPHAGOGASTRODUODENOSCOPY (EGD) WITH PROPOFOL  N/A 03/06/2023   Procedure: ESOPHAGOGASTRODUODENOSCOPY (EGD) WITH PROPOFOL ;  Surgeon: Eartha Angelia Sieving, MD;  [facility];  Service: Gastroenterology;  Laterality: N/A;  11:45AM;ASA 2   HERNIA REPAIR Bilateral    inguinal   LASER ABLATION CONDOLAMATA N/A 12/02/2018   Procedure: LASER ABLATION CONDYLOMA ACCUMINATA LEFT AND RIGHT VULVA, PERINEUM AND PERIANAL (15 TOTAL);  Surgeon: Jayne Vonn DEL, MD;  Location: [facility];  Service: Gynecology;  Laterality: N/A;   LUMBAR LAMINECTOMY/DECOMPRESSION MICRODISCECTOMY Right 12/03/2021   Procedure: MICRODISCECTOMY Lumbar four- five;  Surgeon: Mavis Purchase, MD;  Location: [facility];  Service: Neurosurgery;  Laterality: Right;   MOUTH SURGERY  09/2021   All top teeth and all wisdom teeth removed   MOUTH SURGERY  12/2021   MOUTH SURGERY  02/2024   toothe removed   POLYPECTOMY  08/29/2020   Procedure: POLYPECTOMY;  Surgeon: Eartha Angelia, Sieving, MD;  Location: [facility];  Service: Gastroenterology;;    Allergies  Allergen Reactions   Tape Other (See Comments)    Blisters   Buspirone  Other (See Comments)    Shaky/jittery   Fluoxetine Other (See Comments)    Ineffective; did not resolve depression/anxiety   Nicoderm [Nicotine ] Itching    Sharp pain at site  Updated 09/10/2020- patient tolerated nicotine  patch   Nsaids Other (See Comments)    History of ulcers    Social History   Tobacco Use   Smoking status: Every Day    Current packs/day: 1.00    Average packs/day: 1 pack/day for 10.0 years (10.0 ttl pk-yrs)    Types: Cigarettes    Passive exposure: Current   Smokeless tobacco: Never  Substance Use Topics   Alcohol  use: Not Currently    Family History  Problem Relation Age of Onset   Anxiety  disorder Mother    Hyperlipidemia Mother    Crohn's disease Sister    Colon cancer Maternal Aunt    Diabetes Maternal Grandfather    Diabetes Cousin    Learning disabilities Cousin    Prior to Admission medications   Medication Sig Start Date End Date Taking? Authorizing Provider  butalbital -acetaminophen -caffeine  (FIORICET) 50-325-40 MG tablet Take 1 tablet by mouth every 12 (twelve) hours as needed for migraine. 04/05/24  Yes Bevely Doffing, FNP  clonazePAM  (KLONOPIN ) 1 MG tablet Take 1 tablet (1 mg total) by mouth 4 (four) times daily as needed for anxiety. 03/28/24  Yes Bevely Doffing, FNP  diphenhydrAMINE  (BENADRYL ) 25 MG tablet Take 25 mg by mouth every 6 (six) hours as needed for allergies.   Yes [provider]  DULoxetine  (CYMBALTA ) 30 MG capsule Take 1 capsule (30 mg total) by mouth daily for 7 days, THEN 2 capsules (60 mg total) daily for 23 days. Do not start until finished with sertraline  taper. 02/09/24 04/22/24 Yes Engler, Joesph BROCKS, DO  etonogestrel  (NEXPLANON ) 68 MG IMPL implant 68 mg by Subdermal route once.   Yes [provider]  gabapentin  (NEURONTIN ) 300 MG capsule Take 1 capsule (300 mg total) by mouth 2 (two) times daily. 04/05/24  Yes Bevely Doffing, FNP  methocarbamol  (ROBAXIN -750) 750 MG tablet Take 1 tablet (750 mg total) by mouth every 8 (eight) hours as needed for muscle spasms. 01/05/24  Yes Bevely Doffing, FNP  metroNIDAZOLE  (FLAGYL ) 500 MG tablet Take 1 tablet (500 mg total) by mouth 2 (two) times daily. 04/08/24  Yes Signa Delon LABOR, NP  omeprazole  (PRILOSEC) 20 MG capsule Take 1 capsule (20 mg total) by mouth daily. 04/12/24  Yes Eartha Angelia Sieving, MD  primidone  (MYSOLINE ) 50 MG tablet Take 1 tablet (50 mg total) by mouth at bedtime. 02/18/24  Yes Whitfield Raisin, NP  promethazine  (PHENERGAN ) 12.5 MG suppository Place 1 suppository (12.5 mg total) rectally every 8 (eight) hours as needed for nausea or vomiting. 04/12/24  Yes Castaneda Mayorga,  Sieving, MD  SPIRIVA RESPIMAT 2.5 MCG/ACT AERS Inhale 2 each into the lungs daily as needed (respiratory issues.). 09/24/22  Yes [provider]  sucralfate  (CARAFATE ) 1 g tablet Take 1 tablet (1 g total) by mouth 3 (three) times daily before meals. 11/06/21  Yes Carlan, Chelsea L, NP  sulfamethoxazole -trimethoprim  (BACTRIM  DS) 800-160 MG tablet Take 1 tablet by mouth 2 (two) times daily. Take 1 bid 04/06/24  Yes Signa Delon A, NP  traZODone  (DESYREL ) 50 MG tablet Take 2 tablets (100 mg total) by mouth at bedtime. 04/08/24  Yes Bevely Doffing, FNP  VRAYLAR  1.5 MG capsule Take 1 capsule (1.5 mg total) by mouth every evening. 04/08/24  Yes Bevely Doffing, FNP  lubiprostone  (AMITIZA ) 24 MCG capsule Take 1 capsule (24 mcg total) by mouth 2 (two) times daily with a meal. 04/12/24   Eartha Angelia, Sieving, MD  ondansetron  (ZOFRAN ) 4 MG tablet Take 1 tablet (4 mg total) by mouth every 8 (eight) hours as needed for nausea or vomiting. Patient not taking: Reported on 04/12/2024 12/12/23   Towana Ozell BROCKS, MD  valACYclovir  (VALTREX ) 1000 MG tablet Take 1 tablet (1,000 mg total) by mouth 2 (two) times daily. Patient taking differently: Take 1,000 mg by mouth as needed. 08/05/23   Signa Delon LABOR, NP     Review of Systems  Positive ROS: As above  All other systems have been reviewed and were otherwise negative with the exception of those mentioned in the HPI and as above.  Objective: Vital signs in last 24 hours: Temp:  [97.8 F (36.6 C)] 97.8 F (36.6 C) (07/31 0959) Pulse Rate:  [75] 75 (07/31 0959) Resp:  [18] 18 (07/31 0959) BP: (119)/(63) 119/63 (07/31 0959) SpO2:  [99 %] 99 % (07/31 0959) Weight:  [80.3 kg] 80.3 kg (07/31 0959) Estimated body mass index is 26.91 kg/m as calculated from the following:   Height as of this encounter: 5' 8 (1.727 m).   Weight as of this encounter: 80.3 kg.   General Appearance: Alert Head: Normocephalic, without obvious abnormality,  atraumatic Eyes: PERRL, conjunctiva/corneas clear, EOM's intact,    Ears: Normal  Throat: Normal  Neck: Supple, Back: Her lumbar incision is well-healed. Lungs: Clear to auscultation bilaterally, respirations unlabored Heart: Regular rate and rhythm, no murmur, rub or gallop Abdomen: Soft, non-tender Extremities: Extremities normal, atraumatic, no cyanosis or edema Skin: unremarkable  NEUROLOGIC:   Mental status: alert and oriented,Motor Exam - grossly normal Sensory Exam - grossly normal Reflexes:  Coordination - grossly normal Gait - grossly normal Balance - grossly normal Cranial Nerves: I: smell Not tested  II: visual acuity  OS: Normal  OD: Normal   II: visual fields Full to confrontation  II: pupils Equal, round, reactive to light  III,VII: ptosis None  III,IV,VI: extraocular muscles  Full ROM  V: mastication Normal  V: facial light touch sensation  Normal  V,VII: corneal reflex  Present  VII: facial muscle function - upper  Normal  VII: facial muscle function - lower Normal  VIII: hearing Not tested  IX: soft palate elevation  Normal  IX,X: gag reflex Present  XI: trapezius strength  5/5  XI: sternocleidomastoid strength 5/5  XI: neck flexion strength  5/5  XII: tongue strength  Normal    Data Review Lab Results  Component Value Date   WBC 12.2 (H) 04/13/2024   HGB 14.9 04/13/2024   HCT 43.6 04/13/2024   MCV 94.4 04/13/2024   PLT 181 04/13/2024   Lab Results  Component Value Date   NA 139 04/13/2024   K 4.3 04/13/2024   CL 105 04/13/2024   CO2 25 04/13/2024   BUN 5 (L) 04/13/2024   CREATININE 0.90 04/13/2024   GLUCOSE 78 04/13/2024   Lab Results  Component Value Date   INR 1.1 12/16/2022    Assessment/Plan: Cervical spondylosis, cervical radiculopathy, cervicalgia: I have discussed situation with the patient.  I reviewed her imaging studies with her and pointed out the abnormalities.  We have discussed the various treatment options including  surgery.  I have described the surgical treatment option of a C3-4 anterior cervicectomy fusion and plating.  I have shown her surgical models.  I have given her surgical pamphlet.  We have discussed the risk, benefits, alternatives, expected postoperative course, and likelihood of achieving her goals with surgery.  I have answered all her questions.  She has decided proceed with surgery.   Reyes JONETTA Budge 04/22/2024 11:14 AM

## 2024-04-22 NOTE — Anesthesia Postprocedure Evaluation (Addendum)
 Anesthesia Post Note  Patient: AMYBETH SIEG  Procedure(s) Performed: ANTERIOR CERVICAL DECOMPRESSION/DISCECTOMY FUSION CERVICAL THREE-CERVICAL FOUR  PLATE/SCREWS     Patient location during evaluation: PACU Anesthesia Type: General Level of consciousness: awake and alert Pain management: pain level controlled Vital Signs Assessment: post-procedure vital signs reviewed and stable Respiratory status: spontaneous breathing, nonlabored ventilation, respiratory function stable and patient connected to nasal cannula oxygen  Cardiovascular status: blood pressure returned to baseline and stable Postop Assessment: no apparent nausea or vomiting Anesthetic complications: no   There were no known notable events for this encounter.  Last Vitals:  Vitals:   04/22/24 1530 04/22/24 1545  BP: 111/65 113/67  Pulse: (!) 58 (!) 59  Resp: 15 16  Temp: 36.7 C   SpO2: 98% 97%                Franky JONETTA Bald

## 2024-04-23 ENCOUNTER — Encounter (HOSPITAL_COMMUNITY): Payer: Self-pay | Admitting: Neurosurgery

## 2024-04-23 DIAGNOSIS — M4722 Other spondylosis with radiculopathy, cervical region: Secondary | ICD-10-CM | POA: Diagnosis not present

## 2024-04-23 MED ORDER — SCOPOLAMINE 1 MG/3DAYS TD PT72
1.0000 | MEDICATED_PATCH | TRANSDERMAL | Status: DC
  Filled 2024-04-23: qty 1

## 2024-04-23 MED ORDER — SCOPOLAMINE 1 MG/3DAYS TD PT72: 1.0000 | MEDICATED_PATCH | TRANSDERMAL | 0 refills | Status: DC

## 2024-04-23 MED ORDER — DOCUSATE SODIUM 100 MG PO CAPS: 100.0000 mg | ORAL_CAPSULE | Freq: Two times a day (BID) | ORAL | 0 refills | Status: AC

## 2024-04-23 MED ORDER — OXYCODONE-ACETAMINOPHEN 5-325 MG PO TABS
1.0000 | ORAL_TABLET | ORAL | Status: DC | PRN
  Administered 2024-04-23: 2 via ORAL
  Filled 2024-04-23: qty 2

## 2024-04-23 MED ORDER — OXYCODONE-ACETAMINOPHEN 5-325 MG PO TABS: 1.0000 | ORAL_TABLET | ORAL | 0 refills | Status: DC | PRN

## 2024-04-23 NOTE — Progress Notes (Signed)
 Patient alert and oriented, void, ambulate. D/c instructions explain and given. Surgical site clean and dry no sign of infection. Patient d/c home per order.

## 2024-04-23 NOTE — Evaluation (Signed)
 Occupational Therapy Evaluation/Discharge Patient Details Name: Debbie WIEDEL MRN: [id number] DOB: [birth year] Today's Date: 04/23/2024   History of Present Illness   33 y/o female s/p C3-4 anterior cervicectomy fusion and plating completing on 04/22/24. PMH: Anxiety, chronic back pain, GERD, migraine.     Clinical Impressions Patient is s/p cervical fusion surgery resulting in functional limitations due to the deficits listed below (see OT Problem List). All education has been completed during evaluation. Pt educated on collar wear, compensatory strategies for ADLs/mobility and cervical precautions. Pt verbalized and/or demonstrated understanding.  No follow up OT services recommended. Thank you for the referral. OT will sign off and follow up with surgeon when recommended.        If plan is discharge home, recommend the following:   Assist for transportation;Help with stairs or ramp for entrance     Functional Status Assessment   Patient has had a recent decline in their functional status and demonstrates the ability to make significant improvements in function in a reasonable and predictable amount of time.     Equipment Recommendations   None recommended by OT      Precautions/Restrictions   Precautions Precautions: Cervical Precaution Booklet Issued: Yes (comment) Recall of Precautions/Restrictions: Intact Precaution/Restrictions Comments: Pt educated on cervical precautions, brace management including donning/doffing technique, pain management strategies, and compensatory techniques for ADL tasks. Required Braces or Orthoses: Cervical Brace Cervical Brace: Hard collar;At all times Restrictions Weight Bearing Restrictions Per Provider Order: No     Mobility Bed Mobility Overal bed mobility: Modified Independent    General bed mobility comments: Pt educated on completing bed mobility while maintaining cervical precautions    Transfers Overall transfer  level: Independent Equipment used: None    General transfer comment: Pt able to complete transfer including functional mobility in hallway and up/down full flight of steps independently.      Balance Overall balance assessment: Independent      ADL either performed or assessed with clinical judgement   ADL Overall ADL's : Modified independent        Vision Baseline Vision/History: 0 No visual deficits Ability to See in Adequate Light: 0 Adequate Patient Visual Report: No change from baseline Vision Assessment?: No apparent visual deficits     Perception Perception: Not tested       Praxis Praxis: Not tested       Pertinent Vitals/Pain Pain Assessment Pain Assessment: 0-10 Pain Score: 6  Pain Location: neck Pain Descriptors / Indicators: Aching Pain Intervention(s): Monitored during session, Premedicated before session     Extremity/Trunk Assessment Upper Extremity Assessment Upper Extremity Assessment: Right hand dominant;Overall [facility] for tasks assessed   Lower Extremity Assessment Lower Extremity Assessment: Overall WFL for tasks assessed   Cervical / Trunk Assessment Cervical / Trunk Assessment: Neck Surgery   Communication Communication Communication: No apparent difficulties   Cognition Arousal: Alert Behavior During Therapy: WFL for tasks assessed/performed Cognition: No apparent impairments      Following commands: Intact       Cueing  General Comments   Cueing Techniques: Verbal cues  honeycomb dressing intact during session. Cervical collar adjustments made while pt sitting EOB           Home Living Family/patient expects to be discharged to:: Private residence Living Arrangements: Children;Parent (lives with parents and 72 y/o son) Available Help at Discharge: Family;Available 24 hours/day Type of Home: House Home Access: Stairs to enter [employer] of Steps: 2 Entrance Stairs-Rails: None Home Layout: One level  Home  Equipment: None          Prior Functioning/Environment Prior Level of Function : Independent/Modified Independent         OT Problem List: Pain        OT Goals(Current goals can be found in the care plan section)   Acute Rehab OT Goals OT Goal Formulation: All assessment and education complete, DC therapy   OT Frequency:   1X visit       AM-PAC OT 6 Clicks Daily Activity     Outcome Measure Help from another person eating meals?: None Help from another person taking care of personal grooming?: None Help from another person toileting, which includes using toliet, bedpan, or urinal?: None Help from another person bathing (including washing, rinsing, drying)?: None Help from another person to put on and taking off regular upper body clothing?: None Help from another person to put on and taking off regular lower body clothing?: None 6 Click Score: 24   End of Session Equipment Utilized During Treatment: Cervical collar  Activity Tolerance: Patient tolerated treatment well Patient left: in bed;with call bell/phone within reach  OT Visit Diagnosis: Muscle weakness (generalized) (M62.81);Pain Pain - part of body:  (neck)                Time: 9195-9171 OT Time Calculation (min): 24 min Charges:  OT General Charges $OT Visit: 1 Visit OT Evaluation $OT Eval Moderate Complexity: 1 Mod OT Treatments $Self Care/Home Management : 8-22 mins  Leita Howell, OTR/L,CBIS  Supplemental OT - [facility] and [facility] Secure Chat Preferred    Khalis Hittle, Leita BIRCH 04/23/2024, 9:32 AM

## 2024-04-23 NOTE — Discharge Summary (Signed)
 Physician Discharge Summary  Patient ID: Debbie Santana MRN: [id number] DOB/AGE: Mar 02, 1991 33 y.o.  Admit date: 04/22/2024 Discharge date: 04/23/2024  Admission Diagnoses: Cervicalgia, cervical spondylosis, cervical radiculopathy, cervical neuroforaminal stenosis  Discharge Diagnoses: The same Principal Problem:   Cervical spondylosis with radiculopathy   Discharged Condition: good  Hospital Course: I performed a C3-4 anterior cervicectomy fusion and plating on the patient on 04/22/2024.  The surgery went well.  The patient's postoperative course was unremarkable.  On postoperative day 1 she felt well and requested discharge to home.  She also requested a prescription scopolamine  patch for her nausea.  The patient was given written and verbal discharge instructions.  All her questions were answered.  Consults: OT, care management Significant Diagnostic Studies: None Treatments: C3-4 anterior cervicectomy fusion and plating. Discharge Exam: Blood pressure 111/77, pulse (!) 58, temperature 98 F (36.7 C), temperature source Oral, resp. rate 18, height 5' 8 (1.727 m), weight 80.3 kg, last menstrual period 03/14/2024, SpO2 99%. The patient is alert and pleasant.  She looks well.  Her dressing is clean and dry.  There is no hematoma or shift.  Her strength is normal.  Disposition: Home   Allergies as of 04/23/2024       Reactions   Tape Other (See Comments)   Blisters   Buspirone  Other (See Comments)   Shaky/jittery   Fluoxetine Other (See Comments)   Ineffective; did not resolve depression/anxiety   Nicoderm [nicotine ] Itching   Sharp pain at site Updated 09/10/2020- patient tolerated nicotine  patch   Nsaids Other (See Comments)   History of ulcers        Medication List     TAKE these medications    butalbital -acetaminophen -caffeine  50-325-40 MG tablet Commonly known as: FIORICET Take 1 tablet by mouth every 12 (twelve) hours as needed for migraine.   clonazePAM  1  MG tablet Commonly known as: KLONOPIN  Take 1 tablet (1 mg total) by mouth 4 (four) times daily as needed for anxiety.   diphenhydrAMINE  25 MG tablet Commonly known as: BENADRYL  Take 25 mg by mouth every 6 (six) hours as needed for allergies.   docusate sodium  100 MG capsule Commonly known as: COLACE Take 1 capsule (100 mg total) by mouth 2 (two) times daily.   DULoxetine  30 MG capsule Commonly known as: Cymbalta  Take 1 capsule (30 mg total) by mouth daily for 7 days, THEN 2 capsules (60 mg total) daily for 23 days. Do not start until finished with sertraline  taper. Start taking on: Feb 09, 2024   etonogestrel  68 MG Impl implant Commonly known as: NEXPLANON  68 mg by Subdermal route once.   gabapentin  300 MG capsule Commonly known as: NEURONTIN  Take 1 capsule (300 mg total) by mouth 2 (two) times daily.   lubiprostone  24 MCG capsule Commonly known as: Amitiza  Take 1 capsule (24 mcg total) by mouth 2 (two) times daily with a meal.   methocarbamol  750 MG tablet Commonly known as: Robaxin -750 Take 1 tablet (750 mg total) by mouth every 8 (eight) hours as needed for muscle spasms.   metroNIDAZOLE  500 MG tablet Commonly known as: FLAGYL  Take 1 tablet (500 mg total) by mouth 2 (two) times daily.   omeprazole  20 MG capsule Commonly known as: PRILOSEC Take 1 capsule (20 mg total) by mouth daily.   ondansetron  4 MG tablet Commonly known as: ZOFRAN  Take 1 tablet (4 mg total) by mouth every 8 (eight) hours as needed for nausea or vomiting.   oxyCODONE -acetaminophen  5-325 MG tablet Commonly known  as: PERCOCET/ROXICET Take 1-2 tablets by mouth every 4 (four) hours as needed for moderate pain (pain score 4-6).   primidone  50 MG tablet Commonly known as: MYSOLINE  Take 1 tablet (50 mg total) by mouth at bedtime.   promethazine  12.5 MG suppository Commonly known as: PHENERGAN  Place 1 suppository (12.5 mg total) rectally every 8 (eight) hours as needed for nausea or vomiting.    scopolamine  1 MG/3DAYS Commonly known as: TRANSDERM-SCOP Place 1 patch (1.5 mg total) onto the skin every 3 (three) days.   Spiriva Respimat 2.5 MCG/ACT Aers Generic drug: Tiotropium Bromide Monohydrate Inhale 2 each into the lungs daily as needed (respiratory issues.).   sucralfate  1 g tablet Commonly known as: Carafate  Take 1 tablet (1 g total) by mouth 3 (three) times daily before meals.   sulfamethoxazole -trimethoprim  800-160 MG tablet Commonly known as: BACTRIM  DS Take 1 tablet by mouth 2 (two) times daily. Take 1 bid   traZODone  50 MG tablet Commonly known as: DESYREL  Take 2 tablets (100 mg total) by mouth at bedtime.   valACYclovir  1000 MG tablet Commonly known as: Valtrex  Take 1 tablet (1,000 mg total) by mouth 2 (two) times daily. What changed:  when to take this reasons to take this   Vraylar  1.5 MG capsule Generic drug: cariprazine  Take 1 capsule (1.5 mg total) by mouth every evening.         Signed: [name] 04/23/2024, 6:57 AM

## 2024-04-26 ENCOUNTER — Encounter: Attending: Physical Medicine and Rehabilitation | Admitting: Physical Medicine and Rehabilitation

## 2024-04-26 VITALS — BP 120/75 | HR 58 | Ht 68.0 in | Wt 177.0 lb

## 2024-04-26 DIAGNOSIS — M4722 Other spondylosis with radiculopathy, cervical region: Secondary | ICD-10-CM | POA: Insufficient documentation

## 2024-04-26 DIAGNOSIS — M5416 Radiculopathy, lumbar region: Secondary | ICD-10-CM | POA: Insufficient documentation

## 2024-04-26 DIAGNOSIS — F32A Depression, unspecified: Secondary | ICD-10-CM | POA: Insufficient documentation

## 2024-04-26 DIAGNOSIS — G8929 Other chronic pain: Secondary | ICD-10-CM | POA: Insufficient documentation

## 2024-04-26 DIAGNOSIS — F419 Anxiety disorder, unspecified: Secondary | ICD-10-CM | POA: Insufficient documentation

## 2024-04-26 DIAGNOSIS — F1910 Other psychoactive substance abuse, uncomplicated: Secondary | ICD-10-CM | POA: Insufficient documentation

## 2024-04-26 DIAGNOSIS — M62838 Other muscle spasm: Secondary | ICD-10-CM | POA: Diagnosis not present

## 2024-04-26 DIAGNOSIS — G894 Chronic pain syndrome: Secondary | ICD-10-CM | POA: Diagnosis not present

## 2024-04-26 MED ORDER — DULOXETINE HCL 60 MG PO CPEP: 60.0000 mg | ORAL_CAPSULE | Freq: Every day | ORAL | 3 refills | Status: DC

## 2024-04-26 MED ORDER — OXYCODONE-ACETAMINOPHEN 5-325 MG PO TABS: ORAL_TABLET | ORAL | 0 refills | Status: AC

## 2024-04-26 MED ORDER — GABAPENTIN 300 MG PO CAPS: 300.0000 mg | ORAL_CAPSULE | Freq: Three times a day (TID) | ORAL | 3 refills | Status: DC

## 2024-04-26 MED ORDER — TIZANIDINE HCL 2 MG PO TABS: 2.0000 mg | ORAL_TABLET | Freq: Three times a day (TID) | ORAL | 3 refills | Status: AC | PRN

## 2024-04-26 NOTE — Progress Notes (Signed)
 Subjective:    Patient ID: Debbie Santana, female    DOB: December 14, 1990, 33 y.o.   MRN: [id number]  HPI  Debbie Santana is a 33 y.o. year old female  who  has a past medical history of Anxiety, Bacterial vaginosis, Bradycardia (04/17/2017), Chest pain on breathing (02/22/2020), Chronic back pain, Depression, Erosive gastritis (04/11/2017), Gastric ulcer, Genital warts, GERD (gastroesophageal reflux disease), High cholesterol, History of gestational hypertension (05/21/2018), History of kidney stones, Intertrigo (11/16/2019), Intractable abdominal pain (04/11/2017), Intractable nausea and vomiting (12/07/2018), Intractable vomiting (05/20/2020), Irregular intermenstrual bleeding (02/19/2019), Mental disorder, Migraine without aura and without status migrainosus, not intractable (02/19/2019), Nausea (05/10/2020), Nausea & vomiting (12/07/2018), Nexplanon  in place (02/19/2019), Obesity (04/17/2017), Panic disorder, Peptic ulcer disease (04/13/2017), Pinched nerve (09/24/2019), PONV (postoperative nausea and vomiting), Sciatica, Scoliosis, Tremor of both hands, Trichomoniasis, Vaginal Pap smear, abnormal, and Wheezing.   They are presenting to PM&R clinic for follow up related to  chronic low back/SI joint pain bilaterally with occassional radicular pains into her thighs, s/p L4/5 and L5/S1 laminotomy and L4/5 discectomy at age 57.  .  Plan from last visit: laminotomy and L4/5 discectomy at age 75.    Chronic pain syndrome Anxiety and depression -     Tens unit   Start tapering sertraline  to 150 mg at night for 1 week, then 100 mg for 1 week, then 50 mg for 1 week, then stop. Once stopped, start Duloxetine  30 mg daily for 7 days, then increase to 60 mg daily. Message me through mychart if there is an issue with this.    I ordered a TENS unit for chronic localized back pain.    Follow up in 1 month   Chronic radicular lumbar pain   Fill out a records request for Dr. Mavis office; patient endorses  pending ?C spine surgery but it unsure what levels or intervetion.    MRI Lumbar spine 12/25/22:  IMPRESSION: 1. Comparison is made to the prior lumbar spine MRI of 11/22/2021. 2. Lumbar spondylosis and postoperative changes, as outlined and with findings most notably as follows. 3. At L4-L5, there has been interval right laminotomy and discectomy. A previously demonstrated large disc extrusion is no longer present, and there is no significant residual spinal canal stenosis at this level. 4. At L3-L4, a broad-based disc protrusion results in mild right subarticular and central canal narrowing. These findings are unchanged. 5. Similar to the prior examination, disc degeneration is greatest at L3-L4 (mild-to-moderate) and L4-L5 (moderate). Moderate degenerative endplate edema and enhancement at these levels.   Marijuana abuse in remission Substance abuse (HCC)   UDS 01/13/23 + amphetamines, THC, bezodiazepines and opiates; on clonazepam  and norco at the time. She was referred to [facility] 2024 for pain management, offered injections but declined RX opiates due to marijuana use.   Patient endorses abstinence from recreational drugs but does take delta 9 gummies, was unaware these contained THC.    Stop Delta 8/9 gummies. In 1 month, return for urine drug screen. If negative, we will consider low dose Butran patch 5 mcg, but have no gaurantee of starting controlled substances. We will not escalate to oxycodone  or hydrocodone .        Interval Hx:  - Therapies: Not doing much since her surgery; is unsure if Dr. Mavis will send her to PT once cleared for her neck.    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 She says the numbness in her fingers is  significantly improved post-op. Some improvement in strength. She has a follow up later this month.   - Falls:none   - DME: Hard cervical when she is OOB; she is unsure how long she will need it for.    -  Medications:   On percocet 1-2 tabs Q4H PRN post-op  -still taking every 3-4 hours or so; pain mostly around her neck.   Gabapentin  300 mg TID - still taking, with benefit  Cymbalta  - going well, she has noticed a little difference in her pain since starting. She feels her mood before surgeyr was a complete wreck - I was so emotional and stressed. She says she is feeling better post-op, just because she no longer has to worry about the surgery. She says she was throwing up, had insomnia and a migraine for 3 days.   Robaxin  750 mg - still taking, no difference. She does have spasms in the top of her neck extending into her shoulders.   Has scopalamine patches for nausea; which helps.    - Other concerns: She is still taking marijuana; no other concerns today.    Pain Inventory Average Pain 8 Pain Right Now 9 My pain is dull and aching  In the last 24 hours, has pain interfered with the following? General activity 10 Relation with others 4 Enjoyment of life 10 What TIME of day is your pain at its worst? daytime Sleep (in general) Poor  Pain is worse with: bending, sitting, inactivity, standing, and some activites Pain improves with: rest, heat/ice, therapy/exercise, and medication Relief from Meds: 7  Family History  Problem Relation Age of Onset   Anxiety disorder Mother    Hyperlipidemia Mother    Crohn's disease Sister    Colon cancer Maternal Aunt    Diabetes Maternal Grandfather    Diabetes Cousin    Learning disabilities Cousin    Social History   Socioeconomic History   Marital status: Single    Spouse name: Not on file   Number of children: 1   Years of education: Not on file   Highest education level: Not on file  Occupational History   Not on file  Tobacco Use   Smoking status: Every Day    Current packs/day: 1.00    Average packs/day: 1 pack/day for 10.0 years (10.0 ttl pk-yrs)    Types: Cigarettes    Passive exposure: Current   Smokeless tobacco:  Never  Vaping Use   Vaping status: Former  Substance and Sexual Activity   Alcohol  use: Not Currently   Drug use: Yes    Types: Marijuana, Cocaine   Sexual activity: Yes    Birth control/protection: Implant  Other Topics Concern   Not on file  Social History Narrative   Lives with son-58 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      3 dogs and 3 cats      Enjoy: crafts      Diet: eats all food groups -acidic foods   Caffeine : stopped in last week or so   Water : 6-8 cups daily       Wears seat belt   Does not drive   Smoke detectors at home   Fire extinguisher    No Weapons    Social Drivers of Health   Financial Resource Strain: Low Risk  (06/13/2022)   Overall Financial Resource Strain (CARDIA)    Difficulty of Paying Living Expenses: Not very hard  Food Insecurity: Food  Insecurity Present (06/13/2022)   Hunger Vital Sign    Worried About Running Out of Food in the Last Year: Sometimes true    Ran Out of Food in the Last Year: Sometimes true  Transportation Needs: No Transportation Needs (06/13/2022)   PRAPARE - Administrator, Civil Service (Medical): No    Lack of Transportation (Non-Medical): No  Physical Activity: Insufficiently Active (06/13/2022)   Exercise Vital Sign    Days of Exercise per Week: 2 days    Minutes of Exercise per Session: 50 min  Stress: Stress Concern Present (06/13/2022)   [employer] of Occupational Health - Occupational Stress Questionnaire    Feeling of Stress : Rather much  Social Connections: Socially Isolated (06/13/2022)   Social Connection and Isolation Panel    Frequency of Communication with Friends and Family: Once a week    Frequency of Social Gatherings with Friends and Family: Never    Attends Religious Services: Never    Database administrator or Organizations: No    Attends Engineer, structural: Never    Marital Status: Never married   Past Surgical History:  Procedure  Laterality Date   ANTERIOR CERVICAL DECOMP/DISCECTOMY FUSION N/A 04/22/2024   Procedure: ANTERIOR CERVICAL DECOMPRESSION/DISCECTOMY FUSION CERVICAL THREE-CERVICAL FOUR  PLATE/SCREWS;  Surgeon: Mavis Purchase, MD;  Location: [facility] OR;  Service: Neurosurgery;  Laterality: N/A;   BACK SURGERY     ruptured disc.   BACK SURGERY  11/2021   BIOPSY  04/14/2017   Procedure: BIOPSY;  Surgeon: Golda Claudis PENNER, MD;  [facility];  Service: Endoscopy;;  gastric   BIOPSY  05/22/2020   Procedure: BIOPSY;  Surgeon: Shaaron Lamar HERO, MD;  [facility]: [facility];  Service: Endoscopy;;   BIOPSY  08/29/2020   Procedure: BIOPSY;  Surgeon: Eartha Angelia Sieving, MD;  [facility]: [facility];  Service: Gastroenterology;;   BIOPSY  09/09/2020   Procedure: BIOPSY;  Surgeon: Cindie Carlin POUR, DO;  [facility];  Service: Endoscopy;;  duodenum;antral   BIOPSY  11/21/2021   Procedure: BIOPSY;  Surgeon: Golda Claudis PENNER, MD;  [facility];  Service: Endoscopy;;   BIOPSY  03/06/2023   Procedure: BIOPSY;  Surgeon: Eartha Angelia Sieving, MD;  [facility];  Service: Gastroenterology;;   CERVICAL ABLATION N/A 12/02/2018   Procedure: LASER ABLATION OF CERVIX;  Surgeon: Jayne Vonn DEL, MD;  Location: [facility];  Service: Gynecology;  Laterality: N/A;   CHOLECYSTECTOMY N/A 04/17/2017   Procedure: LAPAROSCOPIC CHOLECYSTECTOMY;  Surgeon: Mavis Anes, MD;  Location: [facility];  Service: General;  Laterality: N/A;   COLONOSCOPY WITH PROPOFOL  N/A 08/29/2020   Procedure: COLONOSCOPY WITH PROPOFOL ;  Surgeon: Eartha Angelia Sieving, MD;  [facility];  Service: Gastroenterology;  Laterality: N/A;  11:15   COLPOSCOPY     ESOPHAGOGASTRODUODENOSCOPY (EGD) WITH PROPOFOL  N/A 04/14/2017   Procedure: ESOPHAGOGASTRODUODENOSCOPY (EGD) WITH PROPOFOL ;  Surgeon: Golda Claudis PENNER, MD;  [facility];  Service: Endoscopy;  Laterality: N/A;   ESOPHAGOGASTRODUODENOSCOPY (EGD)  WITH PROPOFOL  N/A 05/22/2020   Procedure: ESOPHAGOGASTRODUODENOSCOPY (EGD) WITH PROPOFOL ;  Surgeon: Shaaron Lamar HERO, MD;  [facility];  Service: Endoscopy;  Laterality: N/A;   ESOPHAGOGASTRODUODENOSCOPY (EGD) WITH PROPOFOL  N/A 09/09/2020   Procedure: ESOPHAGOGASTRODUODENOSCOPY (EGD) WITH PROPOFOL ;  Surgeon: Cindie Carlin POUR, DO;  [facility];  Service: Endoscopy;  Laterality: N/A;   ESOPHAGOGASTRODUODENOSCOPY (EGD) WITH PROPOFOL  N/A 11/21/2020   Procedure: ESOPHAGOGASTRODUODENOSCOPY (EGD) WITH PROPOFOL ;  Surgeon:  Eartha Angelia Sieving, MD;  [facility];  Service: Gastroenterology;  Laterality: N/A;  Am   ESOPHAGOGASTRODUODENOSCOPY (EGD) WITH PROPOFOL  N/A 11/21/2021   Procedure: ESOPHAGOGASTRODUODENOSCOPY (EGD) WITH PROPOFOL ;  Surgeon: Golda Claudis PENNER, MD;  [facility];  Service: Endoscopy;  Laterality: N/A;  820   ESOPHAGOGASTRODUODENOSCOPY (EGD) WITH PROPOFOL  N/A 03/06/2023   Procedure: ESOPHAGOGASTRODUODENOSCOPY (EGD) WITH PROPOFOL ;  Surgeon: Eartha Angelia Sieving, MD;  [facility];  Service: Gastroenterology;  Laterality: N/A;  11:45AM;ASA 2   HERNIA REPAIR Bilateral    inguinal   LASER ABLATION CONDOLAMATA N/A 12/02/2018   Procedure: LASER ABLATION CONDYLOMA ACCUMINATA LEFT AND RIGHT VULVA, PERINEUM AND PERIANAL (15 TOTAL);  Surgeon: Jayne Vonn DEL, MD;  [facility]: [facility];  Service: Gynecology;  Laterality: N/A;   LUMBAR LAMINECTOMY/DECOMPRESSION MICRODISCECTOMY Right 12/03/2021   Procedure: MICRODISCECTOMY Lumbar four- five;  Surgeon: Mavis Purchase, MD;  Location[facility] OR;  Service: Neurosurgery;  Laterality: Right;   MOUTH SURGERY  09/2021   All top teeth and all wisdom teeth removed   MOUTH SURGERY  12/2021   MOUTH SURGERY  02/2024   toothe removed   POLYPECTOMY  08/29/2020   Procedure: POLYPECTOMY;  Surgeon: Eartha Angelia Sieving, MD;  [facility];  Service: Gastroenterology;;   Past Surgical  History:  Procedure Laterality Date   ANTERIOR CERVICAL DECOMP/DISCECTOMY FUSION N/A 04/22/2024   Procedure: ANTERIOR CERVICAL DECOMPRESSION/DISCECTOMY FUSION CERVICAL THREE-CERVICAL FOUR  PLATE/SCREWS;  Surgeon: Mavis Purchase, MD;  Location: [facility] OR;  Service: Neurosurgery;  Laterality: N/A;   BACK SURGERY     ruptured disc.   BACK SURGERY  11/2021   BIOPSY  04/14/2017   Procedure: BIOPSY;  Surgeon: Golda Claudis PENNER, MD;  [facility];  Service: Endoscopy;;  gastric   BIOPSY  05/22/2020   Procedure: BIOPSY;  Surgeon: Shaaron Lamar HERO, MD;  [facility];  Service: Endoscopy;;   BIOPSY  08/29/2020   Procedure: BIOPSY;  Surgeon: Eartha Angelia Sieving, MD;  [facility];  Service: Gastroenterology;;   BIOPSY  09/09/2020   Procedure: BIOPSY;  Surgeon: Cindie Carlin POUR, DO;  [facility];  Service: Endoscopy;;  duodenum;antral   BIOPSY  11/21/2021   Procedure: BIOPSY;  Surgeon: Golda Claudis PENNER, MD;  [facility];  Service: Endoscopy;;   BIOPSY  03/06/2023   Procedure: BIOPSY;  Surgeon: Eartha Angelia Sieving, MD;  [facility];  Service: Gastroenterology;;   CERVICAL ABLATION N/A 12/02/2018   Procedure: LASER ABLATION OF CERVIX;  Surgeon: Jayne Vonn DEL, MD;  [facility]: [facility];  Service: Gynecology;  Laterality: N/A;   CHOLECYSTECTOMY N/A 04/17/2017   Procedure: LAPAROSCOPIC CHOLECYSTECTOMY;  Surgeon: Mavis Anes, MD;  Location: [facility];  Service: General;  Laterality: N/A;   COLONOSCOPY WITH PROPOFOL  N/A 08/29/2020   Procedure: COLONOSCOPY WITH PROPOFOL ;  Surgeon: Eartha Angelia Sieving, MD;  [facility];  Service: Gastroenterology;  Laterality: N/A;  11:15   COLPOSCOPY     ESOPHAGOGASTRODUODENOSCOPY (EGD) WITH PROPOFOL  N/A 04/14/2017   Procedure: ESOPHAGOGASTRODUODENOSCOPY (EGD) WITH PROPOFOL ;  Surgeon: Golda Claudis PENNER, MD;  [facility];  Service: Endoscopy;  Laterality: N/A;    ESOPHAGOGASTRODUODENOSCOPY (EGD) WITH PROPOFOL  N/A 05/22/2020   Procedure: ESOPHAGOGASTRODUODENOSCOPY (EGD) WITH PROPOFOL ;  Surgeon: Shaaron Lamar HERO, MD;  [facility];  Service: Endoscopy;  Laterality: N/A;   ESOPHAGOGASTRODUODENOSCOPY (EGD) WITH PROPOFOL  N/A 09/09/2020   Procedure: ESOPHAGOGASTRODUODENOSCOPY (EGD) WITH PROPOFOL ;  Surgeon: Cindie Carlin POUR, DO;  [facility];  Service: Endoscopy;  Laterality: N/A;   ESOPHAGOGASTRODUODENOSCOPY (EGD) WITH PROPOFOL  N/A 11/21/2020   Procedure: ESOPHAGOGASTRODUODENOSCOPY (EGD) WITH PROPOFOL ;  Surgeon: Eartha Angelia Sieving, MD;  [facility];  Service: Gastroenterology;  Laterality: N/A;  Am   ESOPHAGOGASTRODUODENOSCOPY (EGD) WITH PROPOFOL  N/A 11/21/2021   Procedure: ESOPHAGOGASTRODUODENOSCOPY (EGD) WITH PROPOFOL ;  Surgeon: Golda Claudis PENNER, MD;  [facility];  Service: Endoscopy;  Laterality: N/A;  820   ESOPHAGOGASTRODUODENOSCOPY (EGD) WITH PROPOFOL  N/A 03/06/2023   Procedure: ESOPHAGOGASTRODUODENOSCOPY (EGD) WITH PROPOFOL ;  Surgeon: Eartha Angelia Sieving, MD;  [facility];  Service: Gastroenterology;  Laterality: N/A;  11:45AM;ASA 2   HERNIA REPAIR Bilateral    inguinal   LASER ABLATION CONDOLAMATA N/A 12/02/2018   Procedure: LASER ABLATION CONDYLOMA ACCUMINATA LEFT AND RIGHT VULVA, PERINEUM AND PERIANAL (15 TOTAL);  Surgeon: Jayne Vonn DEL, MD;  Location: [facility];  Service: Gynecology;  Laterality: N/A;   LUMBAR LAMINECTOMY/DECOMPRESSION MICRODISCECTOMY Right 12/03/2021   Procedure: MICRODISCECTOMY Lumbar four- five;  Surgeon: Mavis Purchase, MD;  Location: [facility] OR;  Service: Neurosurgery;  Laterality: Right;   MOUTH SURGERY  09/2021   All top teeth and all wisdom teeth removed   MOUTH SURGERY  12/2021   MOUTH SURGERY  02/2024   toothe removed   POLYPECTOMY  08/29/2020   Procedure: POLYPECTOMY;  Surgeon: Eartha Angelia, Sieving, MD;  [facility];  Service:  Gastroenterology;;   Past Medical History:  Diagnosis Date   Anxiety    Bacterial vaginosis    Bradycardia 04/17/2017   Chest pain on breathing 02/22/2020   Chronic back pain    Depression    Erosive gastritis 04/11/2017   Gastric ulcer    Genital warts    GERD (gastroesophageal reflux disease)    High cholesterol    History of gestational hypertension 05/21/2018   Dx intrapartum   History of kidney stones    Intertrigo 11/16/2019   Intractable abdominal pain 04/11/2017   Intractable nausea and vomiting 12/07/2018   Intractable vomiting 05/20/2020   Irregular intermenstrual bleeding 02/19/2019   Mental disorder    Migraine without aura and without status migrainosus, not intractable 02/19/2019   Nausea 05/10/2020   Nausea & vomiting 12/07/2018   Nexplanon  in place 02/19/2019   Obesity 04/17/2017   Panic disorder    Peptic ulcer disease 04/13/2017   Pinched nerve 09/24/2019   PONV (postoperative nausea and vomiting)    Sciatica    Scoliosis    Tremor of both hands    Trichomoniasis    Vaginal Pap smear, abnormal    Wheezing    BP 120/75   Pulse (!) 58   Ht 5' 8 (1.727 m)   Wt 177 lb (80.3 kg)   LMP 04/13/2024 (Exact Date)   SpO2 96%   BMI 26.91 kg/m   Opioid Risk Score:   Fall Risk Score:  `1  Depression screen PHQ 2/9     01/05/2024    4:30 PM 05/01/2023    9:05 AM 06/13/2022    2:50 PM 09/05/2021   10:42 AM 10/31/2020    1:37 PM 09/14/2020   10:05 AM 08/15/2020    9:27 AM  Depression screen PHQ 2/9  Decreased Interest 3 3 3 1 2  0 1  Down, Depressed, Hopeless 3 3 3 2 3  0 1  PHQ - 2 Score 6 6 6 3 5  0 2  Altered sleeping 3 3 2 3 2  0 1  Tired, decreased energy 3 3  3 3 1 1 1   Change in appetite 3 3 3 2 3 1 1   Feeling bad or failure about yourself  3 3 3 3 3  0 0  Trouble concentrating 3 3 3 3 3 1 1   Moving slowly or fidgety/restless 3 0 0 0 0 0 0  Suicidal thoughts 0 0 0  0 0 0  PHQ-9 Score 24 21 20 17 17 3 6   Difficult doing work/chores Somewhat  difficult     Not difficult at all Somewhat difficult      Review of Systems  Musculoskeletal:  Positive for back pain.  All other systems reviewed and are negative.      Objective:   Physical Exam   PE: Constitution: Appropriate appearance for age. No apparent distress  Resp: No respiratory distress. No accessory muscle usage. on RA and CTAB Cardio: Well perfused appearance. No peripheral edema. Abdomen: Nondistended. Nontender.   Psych: Appropriate mood and affect. Neuro: AAOx4. No apparent cognitive deficits    Neurologic Exam:   DTRs: Reflexes were 2+ in bilateral achilles, patella, biceps, BR and triceps.  Babinsky: flexor responses b/l.   Hoffmans: negative b/l Sensory exam: revealed normal sensation in all dermatomal regions in bilateral upper extremities and bilateral lower extremities Motor exam: strength 5/5 throughout bilateral upper extremities and bilateral lower extremities + Shaking with ROM RLE; no strength deficit  Coordination: Fine motor coordination was normal.   Gait: normal   Cervical: + Hard collar. Post-op site heasling well. + TTP trapezius, cervical parapsinals, rhomboids.   Back Exam:   Palpation: Palpatory exam revealed ttp at the bilateral lumbar paraspinals, L PSIS, and SI joints . There was no evidence of spasm.    ROM revealed no restricted ROM  Special/provocative testing:    Slump test: -    Facet loading: +(very non-specific)   TTP at paraspinals: +(sensitive for facet pain...if no ttp then likely not facet pain)       Assessment & Plan:   Debbie Santana is a 33 y.o. year old female  who  has a past medical history of Anxiety, Bacterial vaginosis, Bradycardia (04/17/2017), Chest pain on breathing (02/22/2020), Chronic back pain, Depression, Erosive gastritis (04/11/2017), Gastric ulcer, Genital warts, GERD (gastroesophageal reflux disease), High cholesterol, History of gestational hypertension (05/21/2018), History of kidney  stones, Intertrigo (11/16/2019), Intractable abdominal pain (04/11/2017), Intractable nausea and vomiting (12/07/2018), Intractable vomiting (05/20/2020), Irregular intermenstrual bleeding (02/19/2019), Mental disorder, Migraine without aura and without status migrainosus, not intractable (02/19/2019), Nausea (05/10/2020), Nausea & vomiting (12/07/2018), Nexplanon  in place (02/19/2019), Obesity (04/17/2017), Panic disorder, Peptic ulcer disease (04/13/2017), Pinched nerve (09/24/2019), PONV (postoperative nausea and vomiting), Sciatica, Scoliosis, Tremor of both hands, Trichomoniasis, Vaginal Pap smear, abnormal, and Wheezing.   They are presenting to PM&R clinic for follow up related to  chronic low back/SI joint pain bilaterally with occassional radicular pains into her thighs, s/p L4/5 and L5/S1 laminotomy and L4/5 discectomy at age 66.  .  Chronic pain syndrome Chronic radicular lumbar pain -     Gabapentin ; Take 1 capsule (300 mg total) by mouth 3 (three) times daily.  Dispense: 90 capsule; Refill: 3  Increase gabapentin  from 300 mg twice daily to 300 mg 3 times daily for neuropathic pain  Patient has found benefit from duloxetine , will continue 60 mg daily dosing for now and can discuss increase once recovered from surgery   For localized pain to your neck and low back, I recommend Voltaren  gel over-the-counter as this does not have  the same systemic side effects as ibuprofen , along with heat packs, and over-the-counter Salonpas patches (do not place hot pack over patch).    Follow-up with me in 2 to 3 months; message me through MyChart if any needs between now and then.  Substance abuse (HCC) Anxiety and depression  Patient received Percocet 5 mg #30 tabs postop and is still requiring medication; I have prescribed a pain medication taper with Percocet 5 mg up to 4 times daily for 1 week, then decrease to up to 3 times daily for 1 week, then decrease to twice daily for 2 weeks, then stop.     I will not be prescribing this medication further after this fill is out.  Muscle spasms of neck Cervical spondylosis with radiculopathy  Stop Robaxin /methocarbamol .  Switch to tizanidine  2 mg up to 3 times daily for spasms.  Do not drive or operate heavy machinery while on this medication.  Baclofen contraindicated due to chronic constipation, gastrointestinal upset.   After cleared from your cervical collar by Dr. Mavis, talk to him about postop PT for your neck and shoulder discomfort.  Other orders -     tiZANidine  HCl; Take 1 tablet (2 mg total) by mouth every 8 (eight) hours as needed for muscle spasms. (Patient not taking: Reported on 04/27/2024)  Dispense: 45 tablet; Refill: 3 -     DULoxetine  HCl; Take 1 capsule (60 mg total) by mouth daily. Do not start until finished with sertraline  taper  Dispense: 30 capsule; Refill: 3 -     oxyCODONE -Acetaminophen ; Take 1 tablet by mouth every 6 (six) hours as needed for 7 days for moderate pain (pain score 4-6) or severe pain (pain score 7-10), THEN 1 tablet every 8 (eight) hours as needed for 7 days for moderate pain (pain score 4-6) or severe pain (pain score 7-10), THEN 1 tablet every 12 (twelve) hours as needed for up to 16 days for moderate pain (pain score 4-6) or severe pain (pain score 7-10).  Dispense: 81 tablet; Refill: 0

## 2024-04-26 NOTE — Patient Instructions (Signed)
 Increase gabapentin  from 300 mg twice daily to 300 mg 3 times daily for neuropathic pain  Patient has found benefit from duloxetine , will continue 60 mg daily dosing for now and can discuss increase once recovered from surgery  Stop Robaxin /methocarbamol .  Switch to tizanidine  2 mg up to 3 times daily for spasms.  Do not drive or operate heavy machinery while on this medication.  Baclofen contraindicated due to chronic constipation, gastrointestinal upset.  Patient received Percocet 5 mg #30 tabs postop and is still requiring medication; I have prescribed a pain medication taper with Percocet 5 mg up to 4 times daily for 1 week, then decrease to up to 3 times daily for 1 week, then decrease to twice daily for 2 weeks, then stop.  I will not be prescribing this medication further after this fill is out.  For localized pain to your neck and low back, I recommend Voltaren  gel over-the-counter as this does not have the same systemic side effects as ibuprofen , along with heat packs, and over-the-counter Salonpas patches (do not place hot pack over patch).   After cleared from your cervical collar by Dr. Mavis, talk to him about postop PT for your neck and shoulder discomfort.  Follow-up with me in 2 to 3 months; message me through MyChart if any needs between now and then.

## 2024-04-27 ENCOUNTER — Telehealth: Payer: Self-pay

## 2024-04-27 ENCOUNTER — Other Ambulatory Visit (HOSPITAL_COMMUNITY)
Admission: RE | Admit: 2024-04-27 | Discharge: 2024-04-27 | Disposition: A | Discharge status: Home / Self Care | Source: Ambulatory Visit | Attending: Adult Health | Admitting: Adult Health

## 2024-04-27 ENCOUNTER — Encounter: Payer: Self-pay | Admitting: Adult Health

## 2024-04-27 ENCOUNTER — Ambulatory Visit: Admitting: Adult Health

## 2024-04-27 VITALS — BP 116/75 | HR 64 | Ht 68.0 in | Wt 182.0 lb

## 2024-04-27 DIAGNOSIS — F419 Anxiety disorder, unspecified: Secondary | ICD-10-CM | POA: Diagnosis not present

## 2024-04-27 DIAGNOSIS — L0292 Furuncle, unspecified: Secondary | ICD-10-CM

## 2024-04-27 DIAGNOSIS — F1910 Other psychoactive substance abuse, uncomplicated: Secondary | ICD-10-CM | POA: Diagnosis not present

## 2024-04-27 DIAGNOSIS — G8929 Other chronic pain: Secondary | ICD-10-CM

## 2024-04-27 DIAGNOSIS — Z113 Encounter for screening for infections with a predominantly sexual mode of transmission: Secondary | ICD-10-CM

## 2024-04-27 DIAGNOSIS — F32A Depression, unspecified: Secondary | ICD-10-CM | POA: Diagnosis not present

## 2024-04-27 DIAGNOSIS — M4722 Other spondylosis with radiculopathy, cervical region: Secondary | ICD-10-CM | POA: Diagnosis not present

## 2024-04-27 DIAGNOSIS — M62838 Other muscle spasm: Secondary | ICD-10-CM | POA: Diagnosis not present

## 2024-04-27 NOTE — Telephone Encounter (Signed)
(  Key: [id number]) PA Case ID #: [id number] Submitted Oxy/APAP

## 2024-04-27 NOTE — Progress Notes (Signed)
  Subjective:     Patient ID: Debbie Santana, female   DOB: 19-May-1991, 33 y.o.   MRN: [id number]  HPI Debbie Santana is a  33 year old white female,single, G2P1011, in for follow up on boil and it has resolved and she wants to make sure no BV. She had neck surgery 04/22/24 and has neck brace on.     Component Value Date/Time   DIAGPAP - Low grade squamous intraepithelial lesion (LSIL) (A) 07/15/2023 1112   DIAGPAP (A) 06/13/2022 1453    - Atypical squamous cells of undetermined significance (ASC-US )   DIAGPAP (A) 09/05/2021 1037    - Atypical squamous cells of undetermined significance (ASC-US )   HPVHIGH Negative 07/15/2023 1112   HPVHIGH Negative 06/13/2022 1453   HPVHIGH Negative 09/05/2021 1037   ADEQPAP  07/15/2023 1112    Satisfactory for evaluation; transformation zone component PRESENT.   ADEQPAP  06/13/2022 1453    Satisfactory for evaluation; transformation zone component PRESENT.   ADEQPAP  09/05/2021 1037    Satisfactory for evaluation; transformation zone component PRESENT.  Colpo was 07/30/23 low grade lesion  Review of Systems Boil has resolved Wants to make sure no BV No sex in about 2 weeks Reviewed past medical,surgical, social and family history. Reviewed medications and allergies.     Objective:   Physical Exam BP 116/75 (BP Location: Left Arm, Patient Position: Sitting, Cuff Size: Normal)   Pulse 64   Ht 5' 8 (1.727 m)   Wt 182 lb (82.6 kg)   LMP 04/13/2024 (Exact Date)   BMI 27.67 kg/m     Skin warm and dry.Pelvic: external genitalia is normal in appearance no lesions,boil has resolved, vagina: pink, no odor,urethra has no lesions or masses noted, cervix:smooth and bulbous, uterus: normal size, shape and contour, non tender, no masses felt, adnexa: no masses or tenderness noted. Bladder is non tender and no masses felt. CV swab obtained.  Upstream - 04/27/24 1128       Pregnancy Intention Screening   Does the patient want to become pregnant in the next year? No     Does the patient's partner want to become pregnant in the next year? No    Would the patient like to discuss contraceptive options today? No      Contraception Wrap Up   Current Method Hormonal Implant    End Method Hormonal Implant    Contraception Counseling Provided Yes         Examination chaperoned by Clarita Salt LPN  Assessment:     1. Boil (Primary) Has resolved  2. Screening examination for STD (sexually transmitted disease) CV swab sent for GC/CHL,trich,BV and yeast  - Cervicovaginal ancillary only( [place])     Plan:     Return about 07/21/24 for pap and physical

## 2024-04-27 NOTE — Telephone Encounter (Signed)
 Approved today by [facility]  Medicaid [place] Case: [id number], Status: Approved, Coverage Starts on: 04/27/2024 12:00:00 AM, Coverage Ends on: 10/24/2024 12:00:00 AM. Effective Date: 04/27/2024 Authorization Expiration Date: 10/24/2024

## 2024-04-28 ENCOUNTER — Ambulatory Visit: Payer: Self-pay | Admitting: Adult Health

## 2024-04-28 ENCOUNTER — Encounter: Payer: Self-pay | Admitting: Internal Medicine

## 2024-04-28 LAB — CERVICOVAGINAL ANCILLARY ONLY
Bacterial Vaginitis (gardnerella): POSITIVE — AB
Candida Glabrata: POSITIVE — AB
Candida Vaginitis: NEGATIVE
Chlamydia: NEGATIVE
Comment: NEGATIVE
Comment: NEGATIVE
Comment: NEGATIVE
Comment: NEGATIVE
Comment: NEGATIVE
Comment: NORMAL
Neisseria Gonorrhea: NEGATIVE
Trichomonas: NEGATIVE

## 2024-04-28 MED ORDER — FLUCONAZOLE 150 MG PO TABS: ORAL_TABLET | ORAL | 1 refills | Status: DC

## 2024-04-28 MED ORDER — METRONIDAZOLE 500 MG PO TABS: 500.0000 mg | ORAL_TABLET | Freq: Two times a day (BID) | ORAL | 0 refills | Status: DC

## 2024-04-29 ENCOUNTER — Telehealth: Payer: Self-pay

## 2024-04-29 ENCOUNTER — Other Ambulatory Visit: Payer: Self-pay | Admitting: Internal Medicine

## 2024-04-29 DIAGNOSIS — G250 Essential tremor: Secondary | ICD-10-CM

## 2024-04-29 DIAGNOSIS — M961 Postlaminectomy syndrome, not elsewhere classified: Secondary | ICD-10-CM

## 2024-04-29 MED ORDER — CLONAZEPAM 1 MG PO TABS
1.0000 mg | ORAL_TABLET | Freq: Four times a day (QID) | ORAL | 0 refills | Status: DC | PRN
Start: 2024-04-29 — End: 2024-05-31

## 2024-04-29 NOTE — Telephone Encounter (Signed)
 Copied from CRM [id number]. Topic: Clinical - Prescription Issue >> Apr 29, 2024  2:46 PM Gustabo D wrote: clonazePAM  (KLONOPIN ) 1 MG tablet-Patient is out of medication I can't send a request because her pcp is not listed. Her pcp is Dr. Tobie [phone]

## 2024-05-04 ENCOUNTER — Encounter: Payer: Self-pay | Admitting: Internal Medicine

## 2024-05-04 ENCOUNTER — Ambulatory Visit: Payer: Medicaid Other | Admitting: Internal Medicine

## 2024-05-04 ENCOUNTER — Telehealth: Payer: Self-pay | Admitting: Pharmacy Technician

## 2024-05-04 ENCOUNTER — Ambulatory Visit: Admitting: Internal Medicine

## 2024-05-04 ENCOUNTER — Other Ambulatory Visit (HOSPITAL_COMMUNITY): Payer: Self-pay

## 2024-05-04 VITALS — BP 110/74 | HR 75 | Ht 68.0 in | Wt 184.0 lb

## 2024-05-04 DIAGNOSIS — G894 Chronic pain syndrome: Secondary | ICD-10-CM | POA: Diagnosis not present

## 2024-05-04 DIAGNOSIS — Z720 Tobacco use: Secondary | ICD-10-CM

## 2024-05-04 DIAGNOSIS — G43E19 Chronic migraine with aura, intractable, without status migrainosus: Secondary | ICD-10-CM

## 2024-05-04 DIAGNOSIS — M961 Postlaminectomy syndrome, not elsewhere classified: Secondary | ICD-10-CM

## 2024-05-04 DIAGNOSIS — K2100 Gastro-esophageal reflux disease with esophagitis, without bleeding: Secondary | ICD-10-CM

## 2024-05-04 DIAGNOSIS — J4530 Mild persistent asthma, uncomplicated: Secondary | ICD-10-CM | POA: Diagnosis not present

## 2024-05-04 DIAGNOSIS — M4722 Other spondylosis with radiculopathy, cervical region: Secondary | ICD-10-CM | POA: Diagnosis not present

## 2024-05-04 DIAGNOSIS — F331 Major depressive disorder, recurrent, moderate: Secondary | ICD-10-CM | POA: Insufficient documentation

## 2024-05-04 DIAGNOSIS — F411 Generalized anxiety disorder: Secondary | ICD-10-CM | POA: Diagnosis not present

## 2024-05-04 MED ORDER — ALBUTEROL SULFATE HFA 108 (90 BASE) MCG/ACT IN AERS: 2.0000 | INHALATION_SPRAY | Freq: Four times a day (QID) | RESPIRATORY_TRACT | 2 refills | Status: DC | PRN

## 2024-05-04 NOTE — Patient Instructions (Signed)
 Please continue to take medications as prescribed.  Please continue to follow heart healthy diet and ambulate as tolerated.  Please contact 1-800-QUIT-NOW for nicotine  patch and smoking cessation help.
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
## 2024-05-04 NOTE — Telephone Encounter (Signed)
 Pharmacy Patient Advocate Encounter   Received notification from Onbase that prior authorization for Albuterol  Sulfate HFA 108 (90 Base)MCG/ACT aerosol  is required/requested.   Insurance verification completed.   The patient is insured through [facility] .   Per test claim:  BRAND NAME VENTOLIN  HFA is preferred by the insurance.  If suggested medication is appropriate, Please send in a new RX and discontinue this one. If not, please advise as to why it's not appropriate so that we may request a Prior Authorization. Please note, some preferred medications may still require a PA.  If the suggested medications have not been trialed and there are no contraindications to their use, the PA will not be submitted, as it will not be approved.  New prescription is not required. Meds are interchangeable at the pharmacy as brand preferred. Confirmed with patient's pharmacy that they did receive a paid claim for Brand name Ventolin  HFA.

## 2024-05-05 ENCOUNTER — Encounter: Payer: Self-pay | Admitting: Internal Medicine

## 2024-05-05 DIAGNOSIS — J45909 Unspecified asthma, uncomplicated: Secondary | ICD-10-CM | POA: Insufficient documentation

## 2024-05-05 MED ORDER — BUTALBITAL-APAP-CAFFEINE 50-325-40 MG PO TABS: 1.0000 | ORAL_TABLET | Freq: Two times a day (BID) | ORAL | 0 refills | Status: DC | PRN

## 2024-05-05 NOTE — Assessment & Plan Note (Signed)
 Well-controlled overall Call Vraylar  1.5 mg QD On trazodone  100 mg nightly for insomnia On Cymbalta  60 mg QD, can also help with neuropathic pain

## 2024-05-05 NOTE — Assessment & Plan Note (Signed)
 Well controlled with omeprazole  20 mg QD Has sucralfate  as needed Followed by GI

## 2024-05-05 NOTE — Assessment & Plan Note (Addendum)
 Well-controlled with Fioricet.  Refill provided today. Has tried sumatriptan  in the past

## 2024-05-05 NOTE — Assessment & Plan Note (Signed)
 Recently had anterior cervical discectomy with fusion Followed by [facility] Followed by [facility] for pain management, takes Percocet, gabapentin  and Cymbalta  currently

## 2024-05-05 NOTE — Assessment & Plan Note (Signed)
 Overall well-controlled currently On Klonopin  1 mg 4 times daily as needed - PDMP reviewed, refilled On Vraylar  1.5 mg QD, trazodone  100 mg QD and Cymbalta  60 mg QD

## 2024-05-05 NOTE — Assessment & Plan Note (Signed)
 Smokes about 1 pack/day  Asked about quitting: confirms that he/she currently smokes cigarettes Advise to quit smoking: Educated about QUITTING to reduce the risk of cancer, cardio and cerebrovascular disease. Assess willingness: Unwilling to quit at this time, but is working on cutting back. Assist with counseling and pharmacotherapy: Counseled for 5 minutes and literature provided. Arrange for follow up: follow up in 3 months and continue to offer help.

## 2024-05-05 NOTE — Assessment & Plan Note (Signed)
 Has chronic cough, exertional dyspnea and wheezing Continue Spiriva as maintenance inhaler and as needed albuterol  for dyspnea or wheezing Referred to pulmonology for further evaluation Needs to cut down -> quit smoking

## 2024-05-05 NOTE — Assessment & Plan Note (Signed)
 Has history of lumbar spine surgeries, followed by [facility] Followed by [facility] for pain management, takes Percocet, gabapentin  and Cymbalta  currently

## 2024-05-06 ENCOUNTER — Other Ambulatory Visit: Payer: Self-pay

## 2024-05-18 DIAGNOSIS — Z6827 Body mass index (BMI) 27.0-27.9, adult: Secondary | ICD-10-CM | POA: Diagnosis not present

## 2024-05-18 DIAGNOSIS — M542 Cervicalgia: Secondary | ICD-10-CM | POA: Diagnosis not present

## 2024-05-18 DIAGNOSIS — M4722 Other spondylosis with radiculopathy, cervical region: Secondary | ICD-10-CM | POA: Diagnosis not present

## 2024-05-26 ENCOUNTER — Other Ambulatory Visit: Payer: Self-pay | Admitting: Adult Health

## 2024-05-26 MED ORDER — FLUCONAZOLE 150 MG PO TABS: ORAL_TABLET | ORAL | 1 refills | Status: DC

## 2024-05-26 NOTE — Progress Notes (Signed)
 Refilled diflucan

## 2024-05-31 ENCOUNTER — Other Ambulatory Visit: Payer: Self-pay | Admitting: Internal Medicine

## 2024-05-31 DIAGNOSIS — G250 Essential tremor: Secondary | ICD-10-CM

## 2024-05-31 DIAGNOSIS — M961 Postlaminectomy syndrome, not elsewhere classified: Secondary | ICD-10-CM

## 2024-05-31 MED ORDER — CLONAZEPAM 1 MG PO TABS: 1.0000 mg | ORAL_TABLET | Freq: Four times a day (QID) | ORAL | 2 refills | Status: DC | PRN

## 2024-06-03 ENCOUNTER — Other Ambulatory Visit: Payer: Self-pay | Admitting: Internal Medicine

## 2024-06-03 ENCOUNTER — Encounter: Payer: Self-pay | Admitting: Internal Medicine

## 2024-06-03 DIAGNOSIS — G43E19 Chronic migraine with aura, intractable, without status migrainosus: Secondary | ICD-10-CM

## 2024-06-03 MED ORDER — BUTALBITAL-APAP-CAFFEINE 50-325-40 MG PO TABS: 1.0000 | ORAL_TABLET | Freq: Two times a day (BID) | ORAL | 2 refills | Status: DC | PRN

## 2024-06-07 ENCOUNTER — Telehealth: Payer: Self-pay | Admitting: Adult Health

## 2024-06-07 NOTE — Telephone Encounter (Signed)
 Patient called to confirm appointment because thought has press the wrong key to confirm through the automated system.

## 2024-06-14 ENCOUNTER — Encounter: Payer: Self-pay | Admitting: Neurology

## 2024-06-14 ENCOUNTER — Ambulatory Visit: Admitting: Neurology

## 2024-06-14 VITALS — BP 124/88 | Resp 15 | Ht 68.0 in | Wt 182.0 lb

## 2024-06-14 DIAGNOSIS — R251 Tremor, unspecified: Secondary | ICD-10-CM | POA: Insufficient documentation

## 2024-06-14 DIAGNOSIS — G43709 Chronic migraine without aura, not intractable, without status migrainosus: Secondary | ICD-10-CM

## 2024-06-14 MED ORDER — PRIMIDONE 50 MG PO TABS: 50.0000 mg | ORAL_TABLET | Freq: Every day | ORAL | 11 refills | Status: AC

## 2024-06-14 MED ORDER — AIMOVIG 70 MG/ML ~~LOC~~ SOAJ: 70.0000 mg | SUBCUTANEOUS | 11 refills | Status: AC

## 2024-06-14 MED ORDER — SUMATRIPTAN SUCCINATE 100 MG PO TABS: ORAL_TABLET | ORAL | 11 refills | Status: AC
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
## 2024-06-16 ENCOUNTER — Other Ambulatory Visit: Payer: Self-pay | Admitting: Neurology

## 2024-06-16 DIAGNOSIS — R251 Tremor, unspecified: Secondary | ICD-10-CM | POA: Diagnosis not present

## 2024-06-16 DIAGNOSIS — G43709 Chronic migraine without aura, not intractable, without status migrainosus: Secondary | ICD-10-CM | POA: Diagnosis not present

## 2024-06-17 LAB — TSH: TSH: 1.37 u[IU]/mL (ref 0.450–4.500)

## 2024-06-18 ENCOUNTER — Encounter: Payer: Self-pay | Admitting: Neurology

## 2024-06-19 ENCOUNTER — Ambulatory Visit: Payer: Self-pay | Admitting: Neurology
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
## 2024-06-22 ENCOUNTER — Ambulatory Visit: Admitting: Pulmonary Disease

## 2024-06-22 ENCOUNTER — Encounter: Payer: Self-pay | Admitting: Pulmonary Disease

## 2024-06-22 ENCOUNTER — Other Ambulatory Visit (HOSPITAL_COMMUNITY): Payer: Self-pay

## 2024-06-22 ENCOUNTER — Telehealth: Payer: Self-pay

## 2024-06-22 ENCOUNTER — Ambulatory Visit (HOSPITAL_COMMUNITY)
Admission: RE | Admit: 2024-06-22 | Discharge: 2024-06-22 | Disposition: A | Discharge status: Home / Self Care | Source: Ambulatory Visit | Attending: Pulmonary Disease | Admitting: Pulmonary Disease

## 2024-06-22 VITALS — BP 120/82 | HR 89 | Ht 68.0 in | Wt 184.8 lb

## 2024-06-22 DIAGNOSIS — Z23 Encounter for immunization: Secondary | ICD-10-CM | POA: Diagnosis not present

## 2024-06-22 DIAGNOSIS — J449 Chronic obstructive pulmonary disease, unspecified: Secondary | ICD-10-CM

## 2024-06-22 DIAGNOSIS — F17200 Nicotine dependence, unspecified, uncomplicated: Secondary | ICD-10-CM | POA: Diagnosis not present

## 2024-06-22 DIAGNOSIS — R062 Wheezing: Secondary | ICD-10-CM | POA: Diagnosis not present

## 2024-06-22 DIAGNOSIS — R0602 Shortness of breath: Secondary | ICD-10-CM | POA: Diagnosis not present

## 2024-06-22 DIAGNOSIS — F17210 Nicotine dependence, cigarettes, uncomplicated: Secondary | ICD-10-CM | POA: Diagnosis not present

## 2024-06-22 DIAGNOSIS — J40 Bronchitis, not specified as acute or chronic: Secondary | ICD-10-CM | POA: Diagnosis not present

## 2024-06-22 MED ORDER — NICOTINE 21 MG/24HR TD PT24: MEDICATED_PATCH | TRANSDERMAL | 0 refills | Status: DC

## 2024-06-22 MED ORDER — ALBUTEROL SULFATE (2.5 MG/3ML) 0.083% IN NEBU: 2.5000 mg | INHALATION_SOLUTION | Freq: Four times a day (QID) | RESPIRATORY_TRACT | 5 refills | Status: DC | PRN

## 2024-06-22 MED ORDER — STIOLTO RESPIMAT 2.5-2.5 MCG/ACT IN AERS: 2.0000 | INHALATION_SPRAY | Freq: Every day | RESPIRATORY_TRACT | 6 refills | Status: DC

## 2024-06-22 MED ORDER — NICOTINE 14 MG/24HR TD PT24: MEDICATED_PATCH | TRANSDERMAL | 0 refills | Status: DC

## 2024-06-22 MED ORDER — NICOTINE 7 MG/24HR TD PT24: MEDICATED_PATCH | TRANSDERMAL | 0 refills | Status: DC

## 2024-06-22 NOTE — Telephone Encounter (Signed)
 Pharmacy Patient Advocate Encounter   Received notification from Physician's Office that prior authorization for Aimovig  70mg /ml autoinjectors is required/requested.   Insurance verification completed.   The patient is insured through [employer] .   Per test claim: PA required; PA submitted to above mentioned insurance via Latent Key/confirmation #/EOC [id number] Status is pending

## 2024-06-22 NOTE — Patient Instructions (Addendum)
-   Switch Spiriva to Stiolto 2 puffs every day - Continue albuterol  2 puffs every 4 hours as needed - Work on smoking cessation please, even if you reduce by half for now - I ordered a nebulizer machine for you. You can use the albuterol  nebulizer every 4 hours as needed. - Get CXR done as soon as possible, will call or email you with result. - 3 months follow up

## 2024-06-22 NOTE — Telephone Encounter (Signed)
 aimovig  pa needed

## 2024-06-22 NOTE — Telephone Encounter (Signed)
 Pharmacy Patient Advocate Encounter  Received notification from [employer] that Prior Authorization for Aimovig  has been APPROVED from 06/22/2024 to 09/20/2024. Ran test claim, Copay is $4.00. This test claim was processed through [facility]- copay amounts may vary at other pharmacies due to pharmacy/plan contracts, or as the patient moves through the different stages of their insurance plan.   PA #/Case ID/Reference #: [id number]  Sent pt a [facility] message to let her know   Pa was approved.

## 2024-06-23 ENCOUNTER — Encounter: Payer: Self-pay | Admitting: Physical Medicine and Rehabilitation

## 2024-06-23 ENCOUNTER — Ambulatory Visit (HOSPITAL_COMMUNITY)
Admission: RE | Admit: 2024-06-23 | Discharge: 2024-06-23 | Disposition: A | Discharge status: Home / Self Care | Source: Ambulatory Visit | Attending: Physical Medicine and Rehabilitation | Admitting: Physical Medicine and Rehabilitation

## 2024-06-23 ENCOUNTER — Encounter: Attending: Physical Medicine and Rehabilitation | Admitting: Physical Medicine and Rehabilitation

## 2024-06-23 ENCOUNTER — Ambulatory Visit: Admitting: Physical Medicine and Rehabilitation

## 2024-06-23 VITALS — BP 123/85 | HR 70 | Ht 68.0 in | Wt 187.2 lb

## 2024-06-23 DIAGNOSIS — G8929 Other chronic pain: Secondary | ICD-10-CM | POA: Insufficient documentation

## 2024-06-23 DIAGNOSIS — G894 Chronic pain syndrome: Secondary | ICD-10-CM | POA: Diagnosis not present

## 2024-06-23 DIAGNOSIS — F419 Anxiety disorder, unspecified: Secondary | ICD-10-CM | POA: Diagnosis present

## 2024-06-23 DIAGNOSIS — M5442 Lumbago with sciatica, left side: Secondary | ICD-10-CM | POA: Insufficient documentation

## 2024-06-23 DIAGNOSIS — M5441 Lumbago with sciatica, right side: Secondary | ICD-10-CM | POA: Diagnosis not present

## 2024-06-23 DIAGNOSIS — F32A Depression, unspecified: Secondary | ICD-10-CM | POA: Diagnosis not present

## 2024-06-23 DIAGNOSIS — M533 Sacrococcygeal disorders, not elsewhere classified: Secondary | ICD-10-CM | POA: Diagnosis not present

## 2024-06-23 DIAGNOSIS — Z72820 Sleep deprivation: Secondary | ICD-10-CM | POA: Diagnosis not present

## 2024-06-23 NOTE — Patient Instructions (Signed)
 Continue tizanidine , duloxetine , and over-the-counter Voltaren  gel to neck, shoulders, and low back for pain control.  I will look into your local PT offices at [place] and see if we can get you into aqua therapy for gentle mobilization; if none are available, we will get you into a PT office there.  I am referring you to neurology for a sleep study to look into sleep apnea as a potential cause of chronic fatigue.  I am referring you to psychiatry for dedicated management of severe worsening anxiety and depression on multiple mood medications, poorly controlled.  I am getting x-rays of your low back and pelvis today.  Depending on results, I will message you through MyChart and likely schedule you with Dr. Carilyn for a left SI joint steroid injection given your ongoing left-sided low back pain.  Today, we discussed that I will not be renewing your opiate medications, given history of substance use and concurrent daily use of clonazepam .  Follow-up with me in 2 months

## 2024-06-23 NOTE — Progress Notes (Signed)
 Subjective:    Patient ID: Debbie Santana, female    DOB: December 14, 1990, 33 y.o.   MRN: [id number]  HPI  Debbie Santana is a 33 y.o. year old female  who  has a past medical history of Anxiety, Bacterial vaginosis, Bradycardia (04/17/2017), Chest pain on breathing (02/22/2020), Chronic back pain, Depression, Erosive gastritis (04/11/2017), Gastric ulcer, Genital warts, GERD (gastroesophageal reflux disease), High cholesterol, History of gestational hypertension (05/21/2018), History of kidney stones, Intertrigo (11/16/2019), Intractable abdominal pain (04/11/2017), Intractable nausea and vomiting (12/07/2018), Intractable vomiting (05/20/2020), Irregular intermenstrual bleeding (02/19/2019), Mental disorder, Migraine without aura and without status migrainosus, not intractable (02/19/2019), Nausea (05/10/2020), Nausea & vomiting (12/07/2018), Nexplanon  in place (02/19/2019), Obesity (04/17/2017), Panic disorder, Peptic ulcer disease (04/13/2017), Pinched nerve (09/24/2019), PONV (postoperative nausea and vomiting), Sciatica, Scoliosis, Tremor of both hands, Trichomoniasis, Vaginal Pap smear, abnormal, and Wheezing.     They are presenting to PM&R clinic for follow up related to  chronic low back/SI joint pain bilaterally with occassional radicular pains into her thighs, s/p L4/5 and L5/S1 laminotomy and L4/5 discectomy at age 16.   Plan from last visit: Chronic pain syndrome Chronic radicular lumbar pain -     Gabapentin ; Take 1 capsule (300 mg total) by mouth 3 (three) times daily.  Dispense: 90 capsule; Refill: 3   Increase gabapentin  from 300 mg twice daily to 300 mg 3 times daily for neuropathic pain   Patient has found benefit from duloxetine , will continue 60 mg daily dosing for now and can discuss increase once recovered from surgery     For localized pain to your neck and low back, I recommend Voltaren  gel over-the-counter as this does not have the same systemic side effects as ibuprofen ,  along with heat packs, and over-the-counter Salonpas patches (do not place hot pack over patch).      Follow-up with me in 2 to 3 months; message me through MyChart if any needs between now and then.   Substance abuse (HCC) Anxiety and depression   Patient received Percocet 5 mg #30 tabs postop and is still requiring medication; I have prescribed a pain medication taper with Percocet 5 mg up to 4 times daily for 1 week, then decrease to up to 3 times daily for 1 week, then decrease to twice daily for 2 weeks, then stop.     I will not be prescribing this medication further after this fill is out.   Muscle spasms of neck Cervical spondylosis with radiculopathy   Stop Robaxin /methocarbamol .  Switch to tizanidine  2 mg up to 3 times daily for spasms.  Do not drive or operate heavy machinery while on this medication.  Baclofen contraindicated due to chronic constipation, gastrointestinal upset.     After cleared from your cervical collar by Dr. Mavis, talk to him about postop PT for your neck and shoulder discomfort.   Interval Hx:  - Therapies: She was not started on PT. She notices increased fatigue limiting her. Is just doing things around the house, basic ADLs like cleaning cause extreme pain. She is independent for dressing, bathing. Walking up and down her driveway for exercise.     - Follow ups: Last saw Dr Mavis in August; cleared from C collar, did not place her on any restrictions, did not order PT. Pain has gotten a little better, she is still numbness in her left hand and activity tolerance still very poor.   Seeing Dr. Onita for migraines; is starting aimovig  soon but awaiting PA.     -  Falls: Not her but her dad fell a few weeks ago and she had to get him up; she says she feels she hurt herself in her neck and shoulders pulling him up. She has heard pops since then in her neck; did not get evaluated.     - IFZ:Rozjmzi from the surgical collar.    - Medications:  Dr  Mavis had her on the hydrocodone  post-op; we placed her on a wean which she finished a few weeks ago.   Fioricet - barely needing since starting immitrex  Cymbalta  60 mg daily - she says my depression and anxiety have not been good. She saw a psychiatrist once somewhere else and it didn't go well; has never seen psychiatrist in [place] or a Haematologist.    - Dr. Tobie her PCP prescribes clonazepam  for panic attacks, using once daily. Also on trazodone  and vrylar. I've had a lot of crying spells. No Si/HI.   Gabapentin  300 mg tablets TID - has not noted any side effects  Zanaflex  2 mg  - using with benefit; uses occassionally. Does not make her tired but she tries to take it in the afternoon so it does not effect the care of her 33 year old.    - Other concerns: It feels like I hurt in my legs to the bone; bilateral thighs going down into her heels. She denies any groin or butt anespesia. Does still have tailbone pain.   She says her sleep quality is good; she does snore. No Hx sleep apnea, but has never been assessed. Takes naps during the day for 2-3 hours at a time due to fatigue. She does fall asleep when watching TV or sitting in the car; she does not drive.   Pain Inventory Average Pain 9 Pain Right Now 6 My pain is sharp, dull, stabbing, and aching  In the last 24 hours, has pain interfered with the following? General activity 0 Relation with others 0 Enjoyment of life 0 What TIME of day is your pain at its worst? morning , daytime, and evening Sleep (in general) Fair  Pain is worse with: walking, bending, sitting, inactivity, standing, and some activites Pain improves with: rest, heat/ice, and medication Relief from Meds: 9  Family History  Problem Relation Age of Onset   Anxiety disorder Mother    Hyperlipidemia Mother    Crohn's disease Sister    Colon cancer Maternal Aunt    Diabetes Maternal Grandfather    Diabetes Cousin    Learning disabilities Cousin     Social History   Socioeconomic History   Marital status: Single    Spouse name: Not on file   Number of children: 1   Years of education: Not on file   Highest education level: Not on file  Occupational History   Not on file  Tobacco Use   Smoking status: Every Day    Current packs/day: 1.00    Average packs/day: 1 pack/day for 10.0 years (10.0 ttl pk-yrs)    Types: Cigarettes    Passive exposure: Current   Smokeless tobacco: Never  Vaping Use   Vaping status: Former  Substance and Sexual Activity   Alcohol  use: Not Currently   Drug use: Yes    Types: Marijuana, Cocaine    Comment: no cocaine currently   Sexual activity: Not Currently    Birth control/protection: Implant  Other Topics Concern   Not on file  Social History Narrative   Lives with son-61 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      3 dogs and 3 cats      Enjoy: crafts      Diet: eats all food groups -acidic foods   Caffeine : stopped in last week or so   Water : 6-8 cups daily       Wears seat belt   Does not drive   Smoke detectors at home   Fire extinguisher    No Weapons    Social Drivers of Health   Financial Resource Strain: Low Risk  (06/13/2022)   Overall Financial Resource Strain (CARDIA)    Difficulty of Paying Living Expenses: Not very hard  Food Insecurity: Food Insecurity Present (06/13/2022)   Hunger Vital Sign    Worried About Running Out of Food in the Last Year: Sometimes true    Ran Out of Food in the Last Year: Sometimes true  Transportation Needs: No Transportation Needs (06/13/2022)   PRAPARE - Administrator, Civil Service (Medical): No    Lack of Transportation (Non-Medical): No  Physical Activity: Insufficiently Active (06/13/2022)   Exercise Vital Sign    Days of Exercise per Week: 2 days    Minutes of Exercise per Session: 50 min  Stress: Stress Concern Present (06/13/2022)   [employer] of Occupational Health - Occupational  Stress Questionnaire    Feeling of Stress : Rather much  Social Connections: Socially Isolated (06/13/2022)   Social Connection and Isolation Panel    Frequency of Communication with Friends and Family: Once a week    Frequency of Social Gatherings with Friends and Family: Never    Attends Religious Services: Never    Database administrator or Organizations: No    Attends Engineer, structural: Never    Marital Status: Never married   Past Surgical History:  Procedure Laterality Date   ANTERIOR CERVICAL DECOMP/DISCECTOMY FUSION N/A 04/22/2024   Procedure: ANTERIOR CERVICAL DECOMPRESSION/DISCECTOMY FUSION CERVICAL THREE-CERVICAL FOUR  PLATE/SCREWS;  Surgeon: Mavis Purchase, MD;  Location: [facility];  Service: Neurosurgery;  Laterality: N/A;   BACK SURGERY     ruptured disc.   BACK SURGERY  11/2021   BIOPSY  04/14/2017   Procedure: BIOPSY;  Surgeon: Golda Claudis PENNER, MD;  [facility];  Service: Endoscopy;;  gastric   BIOPSY  05/22/2020   Procedure: BIOPSY;  Surgeon: Shaaron Lamar HERO, MD;  [facility];  Service: Endoscopy;;   BIOPSY  08/29/2020   Procedure: BIOPSY;  Surgeon: Eartha Angelia Sieving, MD;  [facility];  Service: Gastroenterology;;   BIOPSY  09/09/2020   Procedure: BIOPSY;  Surgeon: Cindie Carlin POUR, DO;  [facility];  Service: Endoscopy;;  duodenum;antral   BIOPSY  11/21/2021   Procedure: BIOPSY;  Surgeon: Golda Claudis PENNER, MD;  [facility];  Service: Endoscopy;;   BIOPSY  03/06/2023   Procedure: BIOPSY;  Surgeon: Eartha Angelia Sieving, MD;  [facility];  Service: Gastroenterology;;   CERVICAL ABLATION N/A 12/02/2018   Procedure: LASER ABLATION OF CERVIX;  Surgeon: Jayne Vonn DEL, MD;  Location: [facility];  Service: Gynecology;  Laterality: N/A;   CHOLECYSTECTOMY N/A 04/17/2017   Procedure: LAPAROSCOPIC CHOLECYSTECTOMY;  Surgeon: Mavis Anes, MD;  Location: [facility];  Service: General;   Laterality: N/A;   COLONOSCOPY WITH PROPOFOL  N/A 08/29/2020   Procedure: COLONOSCOPY WITH PROPOFOL ;  Surgeon: Eartha Angelia Sieving, MD;  [facility];  Service: Gastroenterology;  Laterality: N/A;  11:15   COLPOSCOPY     ESOPHAGOGASTRODUODENOSCOPY (EGD) WITH PROPOFOL  N/A 04/14/2017   Procedure: ESOPHAGOGASTRODUODENOSCOPY (EGD) WITH PROPOFOL ;  Surgeon: Golda Claudis PENNER, MD;  [facility];  Service: Endoscopy;  Laterality: N/A;   ESOPHAGOGASTRODUODENOSCOPY (EGD) WITH PROPOFOL  N/A 05/22/2020   Procedure: ESOPHAGOGASTRODUODENOSCOPY (EGD) WITH PROPOFOL ;  Surgeon: Shaaron Lamar HERO, MD;  [facility];  Service: Endoscopy;  Laterality: N/A;   ESOPHAGOGASTRODUODENOSCOPY (EGD) WITH PROPOFOL  N/A 09/09/2020   Procedure: ESOPHAGOGASTRODUODENOSCOPY (EGD) WITH PROPOFOL ;  Surgeon: Cindie Carlin POUR, DO;  [facility];  Service: Endoscopy;  Laterality: N/A;   ESOPHAGOGASTRODUODENOSCOPY (EGD) WITH PROPOFOL  N/A 11/21/2020   Procedure: ESOPHAGOGASTRODUODENOSCOPY (EGD) WITH PROPOFOL ;  Surgeon: Eartha Angelia Sieving, MD;  [facility];  Service: Gastroenterology;  Laterality: N/A;  Am   ESOPHAGOGASTRODUODENOSCOPY (EGD) WITH PROPOFOL  N/A 11/21/2021   Procedure: ESOPHAGOGASTRODUODENOSCOPY (EGD) WITH PROPOFOL ;  Surgeon: Golda Claudis PENNER, MD;  [facility];  Service: Endoscopy;  Laterality: N/A;  820   ESOPHAGOGASTRODUODENOSCOPY (EGD) WITH PROPOFOL  N/A 03/06/2023   Procedure: ESOPHAGOGASTRODUODENOSCOPY (EGD) WITH PROPOFOL ;  Surgeon: Eartha Angelia Sieving, MD;  [facility];  Service: Gastroenterology;  Laterality: N/A;  11:45AM;ASA 2   HERNIA REPAIR Bilateral    inguinal   LASER ABLATION CONDOLAMATA N/A 12/02/2018   Procedure: LASER ABLATION CONDYLOMA ACCUMINATA LEFT AND RIGHT VULVA, PERINEUM AND PERIANAL (15 TOTAL);  Surgeon: Jayne Vonn DEL, MD;  [facility];  Service: Gynecology;  Laterality: N/A;   LUMBAR  LAMINECTOMY/DECOMPRESSION MICRODISCECTOMY Right 12/03/2021   Procedure: MICRODISCECTOMY Lumbar four- five;  Surgeon: Mavis Purchase, MD;  Location: [facility] OR;  Service: Neurosurgery;  Laterality: Right;   MOUTH SURGERY  09/2021   All top teeth and all wisdom teeth removed   MOUTH SURGERY  12/2021   MOUTH SURGERY  02/2024   toothe removed   POLYPECTOMY  08/29/2020   Procedure: POLYPECTOMY;  Surgeon: Eartha Angelia Sieving, MD;  [facility];  Service: Gastroenterology;;   Past Surgical History:  Procedure Laterality Date   ANTERIOR CERVICAL DECOMP/DISCECTOMY FUSION N/A 04/22/2024   Procedure: ANTERIOR CERVICAL DECOMPRESSION/DISCECTOMY FUSION CERVICAL THREE-CERVICAL FOUR  PLATE/SCREWS;  Surgeon: Mavis Purchase, MD;  Location: [facility] OR;  Service: Neurosurgery;  Laterality: N/A;   BACK SURGERY     ruptured disc.   BACK SURGERY  11/2021   BIOPSY  04/14/2017   Procedure: BIOPSY;  Surgeon: Golda Claudis PENNER, MD;  [facility];  Service: Endoscopy;;  gastric   BIOPSY  05/22/2020   Procedure: BIOPSY;  Surgeon: Shaaron Lamar HERO, MD;  [facility];  Service: Endoscopy;;   BIOPSY  08/29/2020   Procedure: BIOPSY;  Surgeon: Eartha Angelia Sieving, MD;  [facility];  Service: Gastroenterology;;   BIOPSY  09/09/2020   Procedure: BIOPSY;  Surgeon: Cindie Carlin POUR, DO;  [facility];  Service: Endoscopy;;  duodenum;antral   BIOPSY  11/21/2021   Procedure: BIOPSY;  Surgeon: Golda Claudis PENNER, MD;  [facility];  Service: Endoscopy;;   BIOPSY  03/06/2023   Procedure: BIOPSY;  Surgeon: Eartha Angelia Sieving, MD;  [facility];  Service: Gastroenterology;;   CERVICAL ABLATION N/A 12/02/2018   Procedure: LASER ABLATION OF CERVIX;  Surgeon: Jayne Vonn DEL, MD;  [facility];  Service: Gynecology;  Laterality: N/A;   CHOLECYSTECTOMY N/A 04/17/2017   Procedure: LAPAROSCOPIC CHOLECYSTECTOMY;  Surgeon: Mavis Anes, MD;   [facility];  Service: General;  Laterality: N/A;   COLONOSCOPY WITH PROPOFOL   N/A 08/29/2020   Procedure: COLONOSCOPY WITH PROPOFOL ;  Surgeon: Eartha Angelia Sieving, MD;  [facility];  Service: Gastroenterology;  Laterality: N/A;  11:15   COLPOSCOPY     ESOPHAGOGASTRODUODENOSCOPY (EGD) WITH PROPOFOL  N/A 04/14/2017   Procedure: ESOPHAGOGASTRODUODENOSCOPY (EGD) WITH PROPOFOL ;  Surgeon: Golda Claudis PENNER, MD;  [facility];  Service: Endoscopy;  Laterality: N/A;   ESOPHAGOGASTRODUODENOSCOPY (EGD) WITH PROPOFOL  N/A 05/22/2020   Procedure: ESOPHAGOGASTRODUODENOSCOPY (EGD) WITH PROPOFOL ;  Surgeon: Shaaron Lamar HERO, MD;  [facility];  Service: Endoscopy;  Laterality: N/A;   ESOPHAGOGASTRODUODENOSCOPY (EGD) WITH PROPOFOL  N/A 09/09/2020   Procedure: ESOPHAGOGASTRODUODENOSCOPY (EGD) WITH PROPOFOL ;  Surgeon: Cindie Carlin POUR, DO;  [facility];  Service: Endoscopy;  Laterality: N/A;   ESOPHAGOGASTRODUODENOSCOPY (EGD) WITH PROPOFOL  N/A 11/21/2020   Procedure: ESOPHAGOGASTRODUODENOSCOPY (EGD) WITH PROPOFOL ;  Surgeon: Eartha Angelia Sieving, MD;  [facility];  Service: Gastroenterology;  Laterality: N/A;  Am   ESOPHAGOGASTRODUODENOSCOPY (EGD) WITH PROPOFOL  N/A 11/21/2021   Procedure: ESOPHAGOGASTRODUODENOSCOPY (EGD) WITH PROPOFOL ;  Surgeon: Golda Claudis PENNER, MD;  [facility];  Service: Endoscopy;  Laterality: N/A;  820   ESOPHAGOGASTRODUODENOSCOPY (EGD) WITH PROPOFOL  N/A 03/06/2023   Procedure: ESOPHAGOGASTRODUODENOSCOPY (EGD) WITH PROPOFOL ;  Surgeon: Eartha Angelia Sieving, MD;  [facility];  Service: Gastroenterology;  Laterality: N/A;  11:45AM;ASA 2   HERNIA REPAIR Bilateral    inguinal   LASER ABLATION CONDOLAMATA N/A 12/02/2018   Procedure: LASER ABLATION CONDYLOMA ACCUMINATA LEFT AND RIGHT VULVA, PERINEUM AND PERIANAL (15 TOTAL);  Surgeon: Jayne Vonn DEL, MD;  Location: [facility];  Service: Gynecology;   Laterality: N/A;   LUMBAR LAMINECTOMY/DECOMPRESSION MICRODISCECTOMY Right 12/03/2021   Procedure: MICRODISCECTOMY Lumbar four- five;  Surgeon: Mavis Purchase, MD;  Location: [facility] OR;  Service: Neurosurgery;  Laterality: Right;   MOUTH SURGERY  09/2021   All top teeth and all wisdom teeth removed   MOUTH SURGERY  12/2021   MOUTH SURGERY  02/2024   toothe removed   POLYPECTOMY  08/29/2020   Procedure: POLYPECTOMY;  Surgeon: Eartha Angelia, Sieving, MD;  [facility];  Service: Gastroenterology;;   Past Medical History:  Diagnosis Date   Anxiety    Bacterial vaginosis    Bradycardia 04/17/2017   Chest pain on breathing 02/22/2020   Chronic back pain    Depression    Erosive gastritis 04/11/2017   Gastric ulcer    Genital warts    GERD (gastroesophageal reflux disease)    High cholesterol    History of gestational hypertension 05/21/2018   Dx intrapartum   History of kidney stones    Intertrigo 11/16/2019   Intractable abdominal pain 04/11/2017   Intractable nausea and vomiting 12/07/2018   Intractable vomiting 05/20/2020   Irregular intermenstrual bleeding 02/19/2019   Mental disorder    Migraine without aura and without status migrainosus, not intractable 02/19/2019   Nausea 05/10/2020   Nausea & vomiting 12/07/2018   Nexplanon  in place 02/19/2019   Obesity 04/17/2017   Panic disorder    Peptic ulcer disease 04/13/2017   Pinched nerve 09/24/2019   PONV (postoperative nausea and vomiting)    Sciatica    Scoliosis    Tremor of both hands    Trichomoniasis    Vaginal Pap smear, abnormal    Wheezing    BP 123/85   Pulse 70   Ht 5' 8 (1.727 m)   Wt 187 lb 3.2 oz (84.9 kg)   LMP 06/14/2024   SpO2 98%  BMI 28.46 kg/m   Opioid Risk Score:   Fall Risk Score:  `1  Depression screen [facility] 2/9     06/23/2024    9:26 AM 05/04/2024    9:06 AM 01/05/2024    4:30 PM 05/01/2023    9:05 AM 06/13/2022    2:50 PM 09/05/2021   10:42 AM 10/31/2020    1:37 PM   Depression screen PHQ 2/9  Decreased Interest 3 0 3 3 3 1 2   Down, Depressed, Hopeless 3 0 3 3 3 2 3   PHQ - 2 Score 6 0 6 6 6 3 5   Altered sleeping  0 3 3 2 3 2   Tired, decreased energy  0 3 3 3 3 1   Change in appetite  0 3 3 3 2 3   Feeling bad or failure about yourself   0 3 3 3 3 3   Trouble concentrating  0 3 3 3 3 3   Moving slowly or fidgety/restless  0 3 0 0 0 0  Suicidal thoughts  0 0 0 0  0  PHQ-9 Score  0 24 21 20 17 17   Difficult doing work/chores  Not difficult at all Somewhat difficult        Review of Systems  Musculoskeletal:  Positive for gait problem and neck pain.  Psychiatric/Behavioral:  Positive for dysphoric mood.   All other systems reviewed and are negative.      Objective:   Physical Exam   PE: Constitution: Appropriate appearance for age. No apparent distress  Resp: No respiratory distress. No accessory muscle usage. on RA and CTAB Cardio: Well perfused appearance. No peripheral edema. Abdomen: Nondistended. Nontender.   Psych: Appropriate mood and affect. Neuro: AAOx4. No apparent cognitive deficits    Neurologic Exam:   Babinsky: flexor responses b/l.   Hoffmans: negative b/l Sensory exam: revealed normal sensation in all dermatomal regions in bilateral upper extremities and bilateral lower extremities Motor exam: strength 5/5 throughout bilateral upper extremities and bilateral lower extremities Coordination: Fine motor coordination was normal.   Gait: normal   Back Exam:    Palpation: Palpatory exam revealed ttp at the L>R lumbar paraspinals, L PSIS, and SI joints . There was no evidence of spasm.    ROM revealed no restricted ROM  Special/provocative testing:    Slump test: -    Facet loading: +(very non-specific)   TTP at paraspinals: +(sensitive for facet pain...if no ttp then likely not facet pain) + L FABER, FADIR, compression and Yeoman's; - Gillet;s        Assessment & Plan:   Debbie Santana is a 33 y.o. year old female  who   has a past medical history of Anxiety, Bacterial vaginosis, Bradycardia (04/17/2017), Chest pain on breathing (02/22/2020), Chronic back pain, Depression, Erosive gastritis (04/11/2017), Gastric ulcer, Genital warts, GERD (gastroesophageal reflux disease), High cholesterol, History of gestational hypertension (05/21/2018), History of kidney stones, Intertrigo (11/16/2019), Intractable abdominal pain (04/11/2017), Intractable nausea and vomiting (12/07/2018), Intractable vomiting (05/20/2020), Irregular intermenstrual bleeding (02/19/2019), Mental disorder, Migraine without aura and without status migrainosus, not intractable (02/19/2019), Nausea (05/10/2020), Nausea & vomiting (12/07/2018), Nexplanon  in place (02/19/2019), Obesity (04/17/2017), Panic disorder, Peptic ulcer disease (04/13/2017), Pinched nerve (09/24/2019), PONV (postoperative nausea and vomiting), Sciatica, Scoliosis, Tremor of both hands, Trichomoniasis, Vaginal Pap smear, abnormal, and Wheezing.      They are presenting to PM&R clinic for follow up related to chronic low back/SI joint pain bilaterally with occassional radicular pains into her thighs, s/p L4/5 and L5/S1  laminotomy and L4/5 discectomy at age 67.   Chronic pain syndrome Continue tizanidine , duloxetine , and over-the-counter Voltaren  gel to neck, shoulders, and low back for pain control.  Aquatherapy referral sent to: [facility] [address], [place] [address]  Today, we discussed that I will not be renewing your opiate medications, given history of substance use and concurrent daily use of clonazepam .  Follow-up with me in 2 months  Chronic left-sided low back pain with bilateral sciatica Sacroiliac joint pain  I am getting x-rays of your low back and pelvis today.  Depending on results, I will message you through MyChart and likely schedule you with Dr. Carilyn for a left SI joint steroid injection given your ongoing left-sided low back  pain.  Sleep deficient -     Ambulatory referral to Sleep Studies  I am referring you to neurology for a sleep study to look into sleep apnea as a potential cause of chronic fatigue.  Anxiety and depression  I am referring you to psychiatry for dedicated management of severe worsening anxiety and depression on multiple mood medications, poorly controlled.

## 2024-06-26 ENCOUNTER — Ambulatory Visit (HOSPITAL_BASED_OUTPATIENT_CLINIC_OR_DEPARTMENT_OTHER): Payer: Self-pay | Admitting: Pulmonary Disease

## 2024-06-29 ENCOUNTER — Ambulatory Visit: Payer: Self-pay | Admitting: Physical Medicine and Rehabilitation

## 2024-06-29 DIAGNOSIS — M533 Sacrococcygeal disorders, not elsewhere classified: Secondary | ICD-10-CM | POA: Insufficient documentation

## 2024-07-07 ENCOUNTER — Encounter (INDEPENDENT_AMBULATORY_CARE_PROVIDER_SITE_OTHER): Payer: Self-pay | Admitting: Gastroenterology

## 2024-07-12 NOTE — Telephone Encounter (Signed)
 Please get the patient scheduled for a left SI joint injection with Dr Carilyn. Thanks!

## 2024-07-28 ENCOUNTER — Ambulatory Visit (INDEPENDENT_AMBULATORY_CARE_PROVIDER_SITE_OTHER): Admitting: Adult Health

## 2024-07-28 ENCOUNTER — Other Ambulatory Visit (HOSPITAL_COMMUNITY)
Admission: RE | Admit: 2024-07-28 | Discharge: 2024-07-28 | Disposition: A | Discharge status: Home / Self Care | Source: Ambulatory Visit | Attending: Adult Health | Admitting: Adult Health

## 2024-07-28 ENCOUNTER — Encounter: Payer: Self-pay | Admitting: Adult Health

## 2024-07-28 VITALS — BP 129/78 | HR 78 | Ht 68.0 in | Wt 185.5 lb

## 2024-07-28 DIAGNOSIS — Z01419 Encounter for gynecological examination (general) (routine) without abnormal findings: Secondary | ICD-10-CM

## 2024-07-28 DIAGNOSIS — R102 Pelvic and perineal pain unspecified side: Secondary | ICD-10-CM

## 2024-07-28 DIAGNOSIS — Z0000 Encounter for general adult medical examination without abnormal findings: Secondary | ICD-10-CM | POA: Diagnosis present

## 2024-07-28 DIAGNOSIS — Z975 Presence of (intrauterine) contraceptive device: Secondary | ICD-10-CM | POA: Diagnosis not present

## 2024-07-28 DIAGNOSIS — R062 Wheezing: Secondary | ICD-10-CM

## 2024-07-28 DIAGNOSIS — F32A Depression, unspecified: Secondary | ICD-10-CM

## 2024-07-28 DIAGNOSIS — Z1151 Encounter for screening for human papillomavirus (HPV): Secondary | ICD-10-CM

## 2024-07-28 DIAGNOSIS — R350 Frequency of micturition: Secondary | ICD-10-CM | POA: Diagnosis not present

## 2024-07-28 DIAGNOSIS — F419 Anxiety disorder, unspecified: Secondary | ICD-10-CM

## 2024-07-28 LAB — POCT URINALYSIS DIPSTICK
Blood, UA: NEGATIVE
Glucose, UA: NEGATIVE
Ketones, UA: NEGATIVE
Nitrite, UA: NEGATIVE
Protein, UA: NEGATIVE

## 2024-07-28 NOTE — Progress Notes (Signed)
 .Patient ID: Debbie Santana, female   DOB: 06/06/1991, 33 y.o.   MRN: [id number] History of Present Illness: Debbie Santana is a 33 year old white female,single, G2P2011, in for well woman gyn exam and pap. She was recently diagnosed with COPD. She had sex yesterday and had pain and cramping pelvic area low and in rectum. Has noticed white discharge, same partner for 4 months. Last pap was LSIL, negative HPV, colpo was 07/30/23 CIN1 PCP is Dr Tobie   Current Medications, Allergies, Past Medical History, Past Surgical History, Family History and Social History were reviewed in [employer] [facility] electronic medical record.     Review of Systems: Patient denies any headaches, hearing loss, fatigue, blurred vision, shortness of breath, chest pain, abdominal pain, problems with bowel movements,  or intercourse. No joint pain or mood swings. +urinary frequency  See HPI for positives    Physical Exam:BP 129/78 (BP Location: Right Arm, Patient Position: Sitting, Cuff Size: Normal)   Pulse 78   Ht 5' 8 (1.727 m)   Wt 185 lb 8 oz (84.1 kg)   LMP 07/09/2024 (Approximate)   BMI 28.21 kg/m  Urine dipstick trace leuks General:  Well developed, well nourished, no acute distress Skin:  Warm and dry, and tan Neck:  Midline trachea, normal thyroid , good ROM, no lymphadenopathy Lungs;+ wheezing  bilaterally Breast:  No dominant palpable mass, retraction, or nipple discharge Cardiovascular: Regular rate and rhythm Abdomen:  Soft, non tender, no hepatosplenomegaly Pelvic:  External genitalia is normal in appearance, no lesions.  The vagina is normal in appearance. Urethra has no lesions or masses. The cervix is bulbous. Pap with HR HPV genotyping and GC/CHL performed.  Uterus is felt to be normal size, shape, and contour, mildly tender.  No adnexal masses, LLQ tenderness noted.Bladder is non tender, no masses felt. Extremities/musculoskeletal:  No swelling or varicosities noted, no clubbing or cyanosis Psych:  No mood  changes, alert and cooperative,seems happy AA is 0 Fall risk is low    07/28/2024   10:28 AM 06/23/2024    9:26 AM 05/04/2024    9:06 AM  Depression screen PHQ 2/9  Decreased Interest 3 3 0  Down, Depressed, Hopeless 3 3 0  PHQ - 2 Score 6 6 0  Altered sleeping 3  0  Tired, decreased energy 3  0  Change in appetite 3  0  Feeling bad or failure about yourself  3  0  Trouble concentrating 3  0  Moving slowly or fidgety/restless 3  0  Suicidal thoughts 0  0  PHQ-9 Score 24  0  Difficult doing work/chores   Not difficult at all       07/28/2024   10:28 AM 05/04/2024    9:07 AM 01/05/2024    4:31 PM 05/01/2023    9:05 AM  GAD 7 : Generalized Anxiety Score  Nervous, Anxious, on Edge 3 0 3 3  Control/stop worrying 3 0 3 3  Worry too much - different things 3 0 3 3  Trouble relaxing 3 0 3 3  Restless 3 0 0 0  Easily annoyed or irritable 3 0 3 3  Afraid - awful might happen 3 0 0 0  Total GAD 7 Score 21 0 15 15  Anxiety Difficulty  Not difficult at all Extremely difficult     Up[place] - 07/28/24 1038       Pregnancy Intention Screening   Does the patient want to become pregnant in the next year? No  Does the patient's partner want to become pregnant in the next year? No    Would the patient like to discuss contraceptive options today? No      Contraception Wrap Up   Current Method Hormonal Implant    End Method Hormonal Implant    Contraception Counseling Provided Yes           Examination chaperoned by Clarita Salt LPN  Impression and plan: 1. Urinary frequency UA C&S sent to rule out UTI  - POCT Urinalysis Dipstick - Urinalysis, Routine w reflex microscopic - Urine Culture  2. Routine general medical examination at a health care facility (Primary) Pap sent Next pap TBD - Cytology - PAP( [place]) Physical in 1 year Labs with PCP  3. Pelvic pain +pain, will get US  in office in 1 week   4. Nexplanon  in place Placed 12/14/222 Return 09/01/24 for removal  and reinsertion  5. Anxiety and depression On meds and needs to check on referral to psychiatrist, out of [place]  she says   6. Wheezing Recently diagnosed with COPD has inhaler

## 2024-07-29 ENCOUNTER — Other Ambulatory Visit: Payer: Self-pay

## 2024-07-29 ENCOUNTER — Ambulatory Visit (HOSPITAL_COMMUNITY)
Admission: RE | Admit: 2024-07-29 | Discharge: 2024-07-29 | Disposition: A | Discharge status: Home / Self Care | Source: Ambulatory Visit | Attending: Pulmonary Disease | Admitting: Pulmonary Disease

## 2024-07-29 DIAGNOSIS — F32A Depression, unspecified: Secondary | ICD-10-CM

## 2024-07-29 DIAGNOSIS — J449 Chronic obstructive pulmonary disease, unspecified: Secondary | ICD-10-CM | POA: Insufficient documentation

## 2024-07-29 LAB — MICROSCOPIC EXAMINATION
Casts: NONE SEEN /LPF
Epithelial Cells (non renal): >10 /HPF — AB (ref 0–10)
RBC, Urine: NONE SEEN /HPF (ref 0–2)

## 2024-07-29 LAB — PULMONARY FUNCTION TEST
DL/VA % pred: 69 %
DL/VA: 3.03 ml/min/mmHg/L
DLCO unc % pred: 69 %
DLCO unc: 17.61 ml/min/mmHg
FEF 25-75 Pre: 3.31 L/s
FEF2575-%Pred-Pre: 92 %
FEV1-%Pred-Pre: 107 %
FEV1-Pre: 3.78 L
FEV1FVC-%Pred-Pre: 94 %
FEV6-%Pred-Pre: 114 %
FEV6-Pre: 4.81 L
FEV6FVC-%Pred-Pre: 101 %
FVC-%Pred-Pre: 112 %
FVC-Pre: 4.81 L
Pre FEV1/FVC ratio: 79 %
Pre FEV6/FVC Ratio: 100 %
RV % pred: 144 %
RV: 2.41 L
TLC % pred: 128 %
TLC: 7.24 L

## 2024-07-29 LAB — URINALYSIS, ROUTINE W REFLEX MICROSCOPIC
Bilirubin, UA: NEGATIVE
Glucose, UA: NEGATIVE
Ketones, UA: NEGATIVE
Nitrite, UA: NEGATIVE
RBC, UA: NEGATIVE
Specific Gravity, UA: 1.028 (ref 1.005–1.030)
Urobilinogen, Ur: 0.2 mg/dL (ref 0.2–1.0)
pH, UA: 6 (ref 5.0–7.5)

## 2024-07-30 ENCOUNTER — Ambulatory Visit: Payer: Self-pay | Admitting: Adult Health

## 2024-07-30 DIAGNOSIS — R87612 Low grade squamous intraepithelial lesion on cytologic smear of cervix (LGSIL): Secondary | ICD-10-CM

## 2024-07-30 LAB — CYTOLOGY - PAP
Chlamydia: NEGATIVE
Comment: NEGATIVE
Comment: NEGATIVE
Comment: NORMAL
High risk HPV: NEGATIVE
Neisseria Gonorrhea: NEGATIVE

## 2024-07-30 LAB — URINE CULTURE

## 2024-08-02 DIAGNOSIS — R87612 Low grade squamous intraepithelial lesion on cytologic smear of cervix (LGSIL): Secondary | ICD-10-CM | POA: Insufficient documentation

## 2024-08-04 ENCOUNTER — Other Ambulatory Visit

## 2024-08-04 DIAGNOSIS — R102 Pelvic and perineal pain unspecified side: Secondary | ICD-10-CM | POA: Diagnosis not present

## 2024-08-04 NOTE — Progress Notes (Signed)
 PELVIC US  TA/TV: homogeneous anteverted uterus,WNL,EEC 6.6 mm,normal ovaries,ovaries appear mobile,no free fluid  Chaperone Alan

## 2024-08-05 ENCOUNTER — Telehealth: Payer: Self-pay | Admitting: Adult Health

## 2024-08-05 NOTE — Telephone Encounter (Signed)
 Left message that US  was all negative

## 2024-08-13 ENCOUNTER — Encounter: Attending: Physical Medicine and Rehabilitation | Admitting: Physical Medicine & Rehabilitation

## 2024-08-13 ENCOUNTER — Encounter: Payer: Self-pay | Admitting: Physical Medicine & Rehabilitation

## 2024-08-13 VITALS — BP 121/81 | HR 84 | Ht 68.0 in | Wt 187.0 lb

## 2024-08-13 DIAGNOSIS — M533 Sacrococcygeal disorders, not elsewhere classified: Secondary | ICD-10-CM | POA: Insufficient documentation

## 2024-08-13 NOTE — Patient Instructions (Signed)
 Sacroiliac Joint Dysfunction: What to Know  Sacroiliac joint dysfunction causes swelling in one or both sides of the sacroiliac (SI) joint. The SI joint is where two bones in the pelvis meet. The two bones are the sacrum, which is the bone at the bottom of your spine, and the ilium, which is the big bone that makes up your hip. SI joint dysfunction can cause a deep ache or burning pain in your lower back. Sometimes, the pain spreads to your butt, hips, or thighs. What are the causes? SI joint dysfunction may be caused by: Pregnancy. When a person is pregnant, extra pressure is put on the SI joints because the pelvis gets wider. Injuries, such as: Car accidents Sports injuries. Work injuries. Having one leg that is shorter than the other. Conditions that affect the joints, such as: Rheumatoid arthritis. Gout. Psoriatic arthritis. Joint infection. Sometimes, the cause of SI joint dysfunction is not known. What are the signs or symptoms? Symptoms of this condition include: Aching or burning pain in the lower back. The pain may also spread to other areas, such as: Butt. Groin. Thighs. Muscle spasms in or around the painful areas. More pain when standing, walking, running, stair climbing, bending, or lifting. How is this diagnosed? Your health care provider will check your medical history and do a physical exam. During the exam, your provider may move your legs to see if it hurts. They may also do tests like: Imaging tests to look for other causes of pain. These may include: MRI. CT scan. Bone scan. Diagnostic injection. Your provider puts numbing medicine into the SI joint. If your pain gets better after this, it may mean you have this condition. How is this treated? Treatment depends on what is causing the problem and how bad it is. Some options include: Ice or heat applied to the lower back area after an injury. This may help reduce pain and muscle spasms. Medicines to help with  pain and swelling, or to relax the muscles. Wearing a back brace, called an SI brace, to support your back while it heals. Physical therapy to help the muscles around the joint be more strong and improve movement. You may also learn how to move your body in ways that are easier on the joint. Direct manipulation. A therapist may gently move the SI joint to help it work better. Using a device that provides electrical stimulation to help reduce pain at the joint. Other treatments may include: Steroid injections. These can help reduce pain and swelling in the joint. Radiofrequency ablation. This uses heat to burn away nerves that are carrying pain messages from the joint. Surgery to put in screws and plates that limit or prevent joint motion. This is rare. Follow these instructions at home: Medicines Take your medicines only as told. You may need to take these steps to help prevent or treat trouble pooping (constipation): Take medicines to help you poop. Eat foods high in fiber, like beans, whole grains, and fresh fruits and vegetables. Drink more fluids as told. Ask your provider if it's safe to drive or use machines while taking your medicine. If you have a brace: Wear the brace as told. Take it off only if your provider says you can. Keep the brace clean. If the brace is not waterproof: Do not let it get wet. Managing pain, stiffness, and swelling     Icing can help with pain and swelling. Heat may help with muscle tension or spasms. Ask your provider if you  should use ice or heat. Use ice or an ice pack as told. Place a towel between your skin and the ice. Leave the ice on for 20 minutes, 2-3 times a day. Use heat as told. Use the heat source that your provider recommends, such as a moist heat pack or a heating pad. Do this as often as told. Place a towel between your skin and the heat source. Leave the heat on for 20-30 minutes. If your skin turns red, take off the ice or heat  right away to prevent skin damage. The risk of damage is higher if you can't feel pain, heat, or cold. General instructions Rest as told. Ask what things are safe for you to do at home. Ask when you can go back to work or school. Exercise as told. Contact a health care provider if: Your pain is not controlled with medicine. You have a fever. Your pain is getting worse. Get help right away if: You have weakness, numbness, or tingling in your legs or feet. You lose control of your bladder or bowels. This information is not intended to replace advice given to you by your health care provider. Make sure you discuss any questions you have with your health care provider. Document Revised: 08/15/2023 Document Reviewed: 08/15/2023 Elsevier Patient Education  2025 [employer].

## 2024-08-13 NOTE — Progress Notes (Signed)
 Subjective:    Patient ID: Debbie Santana, female    DOB: 1991/02/24, 33 y.o.   MRN: [id number]  HPI Discussed the use of AI scribe software for clinical note transcription with the patient, who gave verbal consent to proceed.  History of Present Illness Debbie Santana is a 33 year old female with chronic low back pain who presents for evaluation of pain management options. She was referred by Dr. Emeline for evaluation of her chronic low back pain and consideration for a sacroiliac injection.  She experiences chronic low back pain that is centrally located and radiates to her tailbone and hips, affecting both sides. The pain has persisted for over five years, worsening over time without significant change in location.  She has undergone two lumbar laminotomies, one in 2018 and another in 2023, but her pain has persisted. She currently takes gabapentin  for pain management and has previously tried Cymbalta . She has not engaged in physical therapy recently due to lack of approval, although she participated in sessions for her back and neck in May and June of this year.  She reports numbness and tingling in her feet, more pronounced on one side, and swelling in both feet. Some discomfort is attributed to a previous ankle injury. Her daily activities include cleaning at home and for others, which may contribute to her symptoms. No new locations of pain but worsening of existing low back pain.   MRI LUMBAR SPINE WITHOUT AND WITH CONTRAST   TECHNIQUE: Multiplanar and multiecho pulse sequences of the lumbar spine were obtained without and with intravenous contrast.   CONTRAST:  10mL GADAVIST  GADOBUTROL  1 MMOL/ML IV SOLN   COMPARISON:  Lumbar spine radiographs 12/03/2021. Lumbar spine MRI 11/22/2021.   FINDINGS: Segmentation: 5 lumbar vertebrae. The caudal most well-formed intervertebral disc space is designated L5-S1.   Alignment:  No significant spondylolisthesis.   Vertebrae: No lumbar  vertebral compression fracture. Moderate degenerative endplate edema and enhancement at L3-L4 and L4-L5.   Conus medullaris and cauda equina: Conus extends to the L1-L2 level. No signal abnormality identified within the visualized distal spinal cord.   Paraspinal and other soft tissues: No acute finding within included portions of the abdomen/retroperitoneum. No paraspinal mass or collection.   Disc levels:   Unless otherwise stated, the level by level findings below have not significantly changed from the prior MRI of 11/22/2021.   Multilevel disc degeneration, greatest at L3-L4 (mild-to-moderate) at L4-L5 (moderate).   T12-L1: This level is imaged in the sagittal plane only. No significant disc herniation or stenosis.   L1-L2: No significant disc herniation or stenosis.   L2-L3: Central posterior annular fissure. Small disc bulge. No significant spinal canal or foraminal stenosis.   L3-L4: Disc bulge with endplate spurring. Superimposed broad-based central/right subarticular disc protrusion (at site of posterior annular fissure). Mild facet arthrosis. Mild right subarticular and central canal narrowing. No significant foraminal stenosis.   L4-L5: Interval right laminotomy and discectomy. Disc bulge with endplate spurring. A previously demonstrated large disc extrusion is no longer present. Mild facet arthrosis. No significant spinal canal stenosis or neural foraminal narrowing.   L5-S1: Post laminotomy changes on the left. Disc bulge with endplate osteophytes. Ligamentum flavum hypertrophy on the right. Mild right subarticular narrowing. No significant central canal stenosis or neural foraminal narrowing.   IMPRESSION: 1. Comparison is made to the prior lumbar spine MRI of 11/22/2021. 2. Lumbar spondylosis and postoperative changes, as outlined and with findings most notably as follows. 3. At L4-L5, there  has been interval right laminotomy and discectomy. A previously  demonstrated large disc extrusion is no longer present, and there is no significant residual spinal canal stenosis at this level. 4. At L3-L4, a broad-based disc protrusion results in mild right subarticular and central canal narrowing. These findings are unchanged. 5. Similar to the prior examination, disc degeneration is greatest at L3-L4 (mild-to-moderate) and L4-L5 (moderate). Moderate degenerative endplate edema and enhancement at these levels.     Electronically Signed   By: Rockey Childs D.O. Pain Inventory Average Pain 7 Pain Right Now 5 My pain is constant, sharp, dull, and aching  In the last 24 hours, has pain interfered with the following? General activity 9 Relation with others 0 Enjoyment of life 8 What TIME of day is your pain at its worst? morning , evening, and night Sleep (in general) Poor  Pain is worse with: walking, bending, sitting, standing, and some activites Pain improves with: rest, heat/ice, and medication Relief from Meds: none taken  Family History  Problem Relation Age of Onset   Anxiety disorder Mother    Hyperlipidemia Mother    Crohn's disease Sister    Colon cancer Maternal Aunt    Diabetes Maternal Grandfather    Diabetes Cousin    Learning disabilities Cousin    Social History   Socioeconomic History   Marital status: Single    Spouse name: Not on file   Number of children: 1   Years of education: Not on file   Highest education level: Not on file  Occupational History   Not on file  Tobacco Use   Smoking status: Every Day    Current packs/day: 1.00    Average packs/day: 1 pack/day for 10.0 years (10.0 ttl pk-yrs)    Types: Cigarettes    Passive exposure: Current   Smokeless tobacco: Never  Vaping Use   Vaping status: Former  Substance and Sexual Activity   Alcohol  use: Not Currently   Drug use: Yes    Types: Marijuana, Cocaine    Comment: no cocaine currently   Sexual activity: Yes    Birth control/protection:  Implant  Other Topics Concern   Not on file  Social History Narrative   Lives with son-18 years old- [employer]/support/administrative Officer (lives with mom and dad)   Father around some.      3 dogs and 3 cats      Enjoy: crafts      Diet: eats all food groups -acidic foods   Caffeine : stopped in last week or so   Water : 6-8 cups daily       Wears seat belt   Does not drive   Smoke detectors at home   Fire extinguisher    No Weapons    Social Drivers of Health   Financial Resource Strain: Medium Risk (07/28/2024)   Overall Financial Resource Strain (CARDIA)    Difficulty of Paying Living Expenses: Somewhat hard  Food Insecurity: Food Insecurity Present (07/28/2024)   Hunger Vital Sign    Worried About Running Out of Food in the Last Year: Often true    Ran Out of Food in the Last Year: Often true  Transportation Needs: No Transportation Needs (07/28/2024)   PRAPARE - Administrator, Civil Service (Medical): No    Lack of Transportation (Non-Medical): No  Physical Activity: Insufficiently Active (07/28/2024)   Exercise Vital Sign    Days of Exercise per Week: 2 days    Minutes of Exercise per Session: 30 min  Stress: Stress Concern Present (07/28/2024)   [employer] of Occupational Health - Occupational Stress Questionnaire    Feeling of Stress: To some extent  Social Connections: Moderately Integrated (07/28/2024)   Social Connection and Isolation Panel    Frequency of Communication with Friends and Family: More than three times a week    Frequency of Social Gatherings with Friends and Family: Once a week    Attends Religious Services: More than 4 times per year    Active Member of Clubs or Organizations: Yes    Attends Engineer, Structural: More than 4 times per year    Marital Status: Never married   Past Surgical History:  Procedure Laterality Date   ANTERIOR CERVICAL DECOMP/DISCECTOMY FUSION N/A 04/22/2024   Procedure: ANTERIOR CERVICAL DECOMPRESSION/DISCECTOMY FUSION CERVICAL  THREE-CERVICAL FOUR  PLATE/SCREWS;  Surgeon: Mavis Purchase, MD;  Location: [facility];  Service: Neurosurgery;  Laterality: N/A;   BACK SURGERY     ruptured disc.   BACK SURGERY  11/2021   BIOPSY  04/14/2017   Procedure: BIOPSY;  Surgeon: Golda Claudis PENNER, MD;  [facility];  Service: Endoscopy;;  gastric   BIOPSY  05/22/2020   Procedure: BIOPSY;  Surgeon: Shaaron Lamar HERO, MD;  [facility];  Service: Endoscopy;;   BIOPSY  08/29/2020   Procedure: BIOPSY;  Surgeon: Eartha Angelia Sieving, MD;  [facility];  Service: Gastroenterology;;   BIOPSY  09/09/2020   Procedure: BIOPSY;  Surgeon: Cindie Carlin POUR, DO;  [facility];  Service: Endoscopy;;  duodenum;antral   BIOPSY  11/21/2021   Procedure: BIOPSY;  Surgeon: Golda Claudis PENNER, MD;  [facility];  Service: Endoscopy;;   BIOPSY  03/06/2023   Procedure: BIOPSY;  Surgeon: Eartha Angelia Sieving, MD;  [facility];  Service: Gastroenterology;;   CERVICAL ABLATION N/A 12/02/2018   Procedure: LASER ABLATION OF CERVIX;  Surgeon: Jayne Vonn DEL, MD;  [facility];  Service: Gynecology;  Laterality: N/A;   CHOLECYSTECTOMY N/A 04/17/2017   Procedure: LAPAROSCOPIC CHOLECYSTECTOMY;  Surgeon: Mavis Anes, MD;  [facility];  Service: General;  Laterality: N/A;   COLONOSCOPY WITH PROPOFOL  N/A 08/29/2020   Procedure: COLONOSCOPY WITH PROPOFOL ;  Surgeon: Eartha Angelia Sieving, MD;  [facility];  Service: Gastroenterology;  Laterality: N/A;  11:15   COLPOSCOPY     ESOPHAGOGASTRODUODENOSCOPY (EGD) WITH PROPOFOL  N/A 04/14/2017   Procedure: ESOPHAGOGASTRODUODENOSCOPY (EGD) WITH PROPOFOL ;  Surgeon: Golda Claudis PENNER, MD;  [facility];  Service: Endoscopy;  Laterality: N/A;   ESOPHAGOGASTRODUODENOSCOPY (EGD) WITH PROPOFOL  N/A 05/22/2020   Procedure: ESOPHAGOGASTRODUODENOSCOPY (EGD) WITH PROPOFOL ;  Surgeon: Shaaron Lamar HERO, MD;  [facility];   Service: Endoscopy;  Laterality: N/A;   ESOPHAGOGASTRODUODENOSCOPY (EGD) WITH PROPOFOL  N/A 09/09/2020   Procedure: ESOPHAGOGASTRODUODENOSCOPY (EGD) WITH PROPOFOL ;  Surgeon: Cindie Carlin POUR, DO;  [facility];  Service: Endoscopy;  Laterality: N/A;   ESOPHAGOGASTRODUODENOSCOPY (EGD) WITH PROPOFOL  N/A 11/21/2020   Procedure: ESOPHAGOGASTRODUODENOSCOPY (EGD) WITH PROPOFOL ;  Surgeon: Eartha Angelia Sieving, MD;  [facility];  Service: Gastroenterology;  Laterality: N/A;  Am   ESOPHAGOGASTRODUODENOSCOPY (EGD) WITH PROPOFOL  N/A 11/21/2021   Procedure: ESOPHAGOGASTRODUODENOSCOPY (EGD) WITH PROPOFOL ;  Surgeon: Golda Claudis PENNER, MD;  [facility];  Service: Endoscopy;  Laterality: N/A;  820   ESOPHAGOGASTRODUODENOSCOPY (EGD) WITH PROPOFOL  N/A 03/06/2023   Procedure: ESOPHAGOGASTRODUODENOSCOPY (EGD) WITH PROPOFOL ;  Surgeon: Eartha Angelia Sieving, MD;  [facility];  Service: Gastroenterology;  Laterality: N/A;  11:45AM;ASA 2   HERNIA REPAIR Bilateral    inguinal   LASER ABLATION CONDOLAMATA N/A 12/02/2018   Procedure: LASER ABLATION CONDYLOMA ACCUMINATA LEFT AND RIGHT VULVA, PERINEUM AND PERIANAL (15 TOTAL);  Surgeon: Jayne Vonn DEL, MD;  [facility];  Service: Gynecology;  Laterality: N/A;   LUMBAR LAMINECTOMY/DECOMPRESSION MICRODISCECTOMY Right 12/03/2021   Procedure: MICRODISCECTOMY Lumbar four- five;  Surgeon: Mavis Purchase, MD;  Location: [facility] OR;  Service: Neurosurgery;  Laterality: Right;   MOUTH SURGERY  09/2021   All top teeth and all wisdom teeth removed   MOUTH SURGERY  12/2021   MOUTH SURGERY  02/2024   toothe removed   POLYPECTOMY  08/29/2020   Procedure: POLYPECTOMY;  Surgeon: Eartha Angelia Sieving, MD;  [facility];  Service: Gastroenterology;;   Past Surgical History:  Procedure Laterality Date   ANTERIOR CERVICAL DECOMP/DISCECTOMY FUSION N/A 04/22/2024   Procedure: ANTERIOR CERVICAL DECOMPRESSION/DISCECTOMY  FUSION CERVICAL THREE-CERVICAL FOUR  PLATE/SCREWS;  Surgeon: Mavis Purchase, MD;  Location: [facility] OR;  Service: Neurosurgery;  Laterality: N/A;   BACK SURGERY     ruptured disc.   BACK SURGERY  11/2021   BIOPSY  04/14/2017   Procedure: BIOPSY;  Surgeon: Golda Claudis PENNER, MD;  [facility];  Service: Endoscopy;;  gastric   BIOPSY  05/22/2020   Procedure: BIOPSY;  Surgeon: Shaaron Lamar HERO, MD;  [facility];  Service: Endoscopy;;   BIOPSY  08/29/2020   Procedure: BIOPSY;  Surgeon: Eartha Angelia Sieving, MD;  [facility];  Service: Gastroenterology;;   BIOPSY  09/09/2020   Procedure: BIOPSY;  Surgeon: Cindie Carlin POUR, DO;  [facility];  Service: Endoscopy;;  duodenum;antral   BIOPSY  11/21/2021   Procedure: BIOPSY;  Surgeon: Golda Claudis PENNER, MD;  [facility];  Service: Endoscopy;;   BIOPSY  03/06/2023   Procedure: BIOPSY;  Surgeon: Eartha Angelia Sieving, MD;  [facility];  Service: Gastroenterology;;   CERVICAL ABLATION N/A 12/02/2018   Procedure: LASER ABLATION OF CERVIX;  Surgeon: Jayne Vonn DEL, MD;  [facility];  Service: Gynecology;  Laterality: N/A;   CHOLECYSTECTOMY N/A 04/17/2017   Procedure: LAPAROSCOPIC CHOLECYSTECTOMY;  Surgeon: Mavis Anes, MD;  [facility];  Service: General;  Laterality: N/A;   COLONOSCOPY WITH PROPOFOL  N/A 08/29/2020   Procedure: COLONOSCOPY WITH PROPOFOL ;  Surgeon: Eartha Angelia Sieving, MD;  [facility];  Service: Gastroenterology;  Laterality: N/A;  11:15   COLPOSCOPY     ESOPHAGOGASTRODUODENOSCOPY (EGD) WITH PROPOFOL  N/A 04/14/2017   Procedure: ESOPHAGOGASTRODUODENOSCOPY (EGD) WITH PROPOFOL ;  Surgeon: Golda Claudis PENNER, MD;  [facility];  Service: Endoscopy;  Laterality: N/A;   ESOPHAGOGASTRODUODENOSCOPY (EGD) WITH PROPOFOL  N/A 05/22/2020   Procedure: ESOPHAGOGASTRODUODENOSCOPY (EGD) WITH PROPOFOL ;  Surgeon: Shaaron Lamar HERO, MD;  [facility];  Service: Endoscopy;  Laterality: N/A;   ESOPHAGOGASTRODUODENOSCOPY (EGD) WITH PROPOFOL  N/A 09/09/2020   Procedure: ESOPHAGOGASTRODUODENOSCOPY (EGD) WITH PROPOFOL ;  Surgeon: Cindie Carlin POUR, DO;  [facility];  Service: Endoscopy;  Laterality: N/A;   ESOPHAGOGASTRODUODENOSCOPY (EGD) WITH PROPOFOL  N/A 11/21/2020   Procedure: ESOPHAGOGASTRODUODENOSCOPY (EGD) WITH PROPOFOL ;  Surgeon: Eartha Angelia Sieving, MD;  [facility];  Service: Gastroenterology;  Laterality: N/A;  Am   ESOPHAGOGASTRODUODENOSCOPY (EGD) WITH PROPOFOL  N/A 11/21/2021   Procedure: ESOPHAGOGASTRODUODENOSCOPY (EGD) WITH PROPOFOL ;  Surgeon: Golda Claudis PENNER, MD;  [facility];  Service: Endoscopy;  Laterality: N/A;  820   ESOPHAGOGASTRODUODENOSCOPY (EGD)  WITH PROPOFOL  N/A 03/06/2023   Procedure: ESOPHAGOGASTRODUODENOSCOPY (EGD) WITH PROPOFOL ;  Surgeon: Eartha Angelia Sieving, MD;  [facility];  Service: Gastroenterology;  Laterality: N/A;  11:45AM;ASA 2   HERNIA REPAIR Bilateral    inguinal   LASER ABLATION CONDOLAMATA N/A 12/02/2018   Procedure: LASER ABLATION CONDYLOMA ACCUMINATA LEFT AND RIGHT VULVA, PERINEUM AND PERIANAL (15 TOTAL);  Surgeon: Jayne Vonn DEL, MD;  [facility];  Service: Gynecology;  Laterality: N/A;   LUMBAR LAMINECTOMY/DECOMPRESSION MICRODISCECTOMY Right 12/03/2021   Procedure: MICRODISCECTOMY Lumbar four- five;  Surgeon: Mavis Purchase, MD;  Location: [facility] OR;  Service: Neurosurgery;  Laterality: Right;   MOUTH SURGERY  09/2021   All top teeth and all wisdom teeth removed   MOUTH SURGERY  12/2021   MOUTH SURGERY  02/2024   toothe removed   POLYPECTOMY  08/29/2020   Procedure: POLYPECTOMY;  Surgeon: Eartha Angelia, Sieving, MD;  [facility];  Service: Gastroenterology;;   Past Medical History:  Diagnosis Date   Anxiety    Bacterial vaginosis    Bradycardia 04/17/2017   Chest pain on breathing 02/22/2020   Chronic back  pain    COPD (chronic obstructive pulmonary disease) (HCC)    Depression    Erosive gastritis 04/11/2017   Gastric ulcer    Genital warts    GERD (gastroesophageal reflux disease)    High cholesterol    History of gestational hypertension 05/21/2018   Dx intrapartum   History of kidney stones    Intertrigo 11/16/2019   Intractable abdominal pain 04/11/2017   Intractable nausea and vomiting 12/07/2018   Intractable vomiting 05/20/2020   Irregular intermenstrual bleeding 02/19/2019   Mental disorder    Migraine without aura and without status migrainosus, not intractable 02/19/2019   Nausea 05/10/2020   Nausea & vomiting 12/07/2018   Nexplanon  in place 02/19/2019   Obesity 04/17/2017   Panic disorder    Peptic ulcer disease 04/13/2017   Pinched nerve 09/24/2019   PONV (postoperative nausea and vomiting)    Sciatica    Scoliosis    Tremor of both hands    Trichomoniasis    Vaginal Pap smear, abnormal    Wheezing    LMP 07/09/2024 (Approximate)   Opioid Risk Score:   Fall Risk Score:  `1  Depression screen [facility] 2/9     07/28/2024   10:28 AM 06/23/2024    9:26 AM 05/04/2024    9:06 AM 01/05/2024    4:30 PM 05/01/2023    9:05 AM 06/13/2022    2:50 PM 09/05/2021   10:42 AM  Depression screen PHQ 2/9  Decreased Interest 3 3 0 3 3 3 1   Down, Depressed, Hopeless 3 3 0 3 3 3 2   PHQ - 2 Score 6 6 0 6 6 6 3   Altered sleeping 3  0 3 3 2 3   Tired, decreased energy 3  0 3 3 3 3   Change in appetite 3  0 3 3 3 2   Feeling bad or failure about yourself  3  0 3 3 3 3   Trouble concentrating 3  0 3 3 3 3   Moving slowly or fidgety/restless 3  0 3 0 0 0  Suicidal thoughts 0  0 0 0 0   PHQ-9 Score 24   0  24  21  20  17    Difficult doing work/chores   Not difficult at all Somewhat difficult        Data saved with a previous flowsheet row  definition    Review of Systems  Musculoskeletal:  Positive for back pain and neck pain.       Pain in both ankles  All other systems reviewed and  are negative.      Objective:   Physical Exam  General no acute distress  Mood and affect appropriate Motor strength is 5/5 bilateral hip flexor knee extensor ankle dorsiflexion plantarflexion Negative straight leg raise bilaterally Ambulates without assistive device no evidence of toe drag or knee instability There is tenderness palpation at L5 and below bilaterally in the lumbar area.  There is some tenderness over the gluteal medius area as well Sacral thrust (prone) :+ Lateral compression:- FABER's: + Distraction (supine):- Thigh thrust test: +      Assessment & Plan:  Assessment and Plan Assessment & Plan Chronic low back pain with bilateral sacroiliac joint pain Chronic low back pain with bilateral sacroiliac joint pain, worsening over time. Previous lower back surgeries in 2018 and 2023.  The patient has failed conservative care including physical therapy within 6 months, medication management and home exercise program- Schedule bilateral sacroiliac joint injection with cortisone and lidocaine . - Obtain insurance authorization for procedure. - Continue back strengthening exercises and stretching as instructed. - Follow up with Dr. Emeline to assess injection effectiveness.   We discussed risks and benefits of the procedure and how the procedure was performed including the use of a spine model

## 2024-08-17 DIAGNOSIS — M4722 Other spondylosis with radiculopathy, cervical region: Secondary | ICD-10-CM | POA: Diagnosis not present

## 2024-08-17 DIAGNOSIS — M542 Cervicalgia: Secondary | ICD-10-CM | POA: Diagnosis not present

## 2024-08-17 DIAGNOSIS — Z6828 Body mass index (BMI) 28.0-28.9, adult: Secondary | ICD-10-CM | POA: Diagnosis not present

## 2024-08-23 ENCOUNTER — Encounter: Admitting: Physical Medicine and Rehabilitation

## 2024-08-24 ENCOUNTER — Other Ambulatory Visit (HOSPITAL_COMMUNITY): Payer: Self-pay

## 2024-08-24 ENCOUNTER — Other Ambulatory Visit (HOSPITAL_COMMUNITY)
Admission: RE | Admit: 2024-08-24 | Discharge: 2024-08-24 | Disposition: A | Source: Ambulatory Visit | Attending: Women's Health | Admitting: Women's Health

## 2024-08-24 ENCOUNTER — Other Ambulatory Visit: Payer: Self-pay | Admitting: Internal Medicine

## 2024-08-24 ENCOUNTER — Telehealth: Payer: Self-pay

## 2024-08-24 ENCOUNTER — Encounter: Payer: Self-pay | Admitting: Women's Health

## 2024-08-24 ENCOUNTER — Ambulatory Visit: Admitting: Women's Health

## 2024-08-24 VITALS — BP 111/71 | HR 88 | Ht 68.0 in | Wt 197.4 lb

## 2024-08-24 DIAGNOSIS — F5101 Primary insomnia: Secondary | ICD-10-CM

## 2024-08-24 DIAGNOSIS — Z3202 Encounter for pregnancy test, result negative: Secondary | ICD-10-CM

## 2024-08-24 DIAGNOSIS — N870 Mild cervical dysplasia: Secondary | ICD-10-CM | POA: Diagnosis not present

## 2024-08-24 DIAGNOSIS — N880 Leukoplakia of cervix uteri: Secondary | ICD-10-CM | POA: Diagnosis not present

## 2024-08-24 DIAGNOSIS — R87619 Unspecified abnormal cytological findings in specimens from cervix uteri: Secondary | ICD-10-CM | POA: Insufficient documentation

## 2024-08-24 DIAGNOSIS — R87612 Low grade squamous intraepithelial lesion on cytologic smear of cervix (LGSIL): Secondary | ICD-10-CM

## 2024-08-24 DIAGNOSIS — M961 Postlaminectomy syndrome, not elsewhere classified: Secondary | ICD-10-CM

## 2024-08-24 DIAGNOSIS — N888 Other specified noninflammatory disorders of cervix uteri: Secondary | ICD-10-CM | POA: Diagnosis not present

## 2024-08-24 DIAGNOSIS — G250 Essential tremor: Secondary | ICD-10-CM

## 2024-08-24 LAB — POCT URINE PREGNANCY: Preg Test, Ur: NEGATIVE

## 2024-08-24 NOTE — Progress Notes (Signed)
   COLPOSCOPY PROCEDURE NOTE Patient name: TEONIA YAGER MRN [id number]  Date of birth: 23-Dec-1990 Subjective Findings:   SHANIELLE CORRELL is a 33 y.o. [id number] Caucasian female being seen today for a colposcopy. 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 Contraception: Nexplanon . Menopausal: no. Hysterectomy: no.   Considering pregnancy: No  Smoker: yes. Immunocompromised: no.   The risks and benefits were explained and informed consent was obtained, and written copy is in chart. Pertinent History Reviewed:   Reviewed past medical,surgical, social, obstetrical and family history.  Reviewed problem list, medications and allergies. Objective Findings & Procedure:   Vitals:   08/24/24 0905  BP: 111/71  Pulse: 88  Weight: 197 lb 6.4 oz (89.5 kg)  Height: 5' 8 (1.727 m)  Body mass index is 30.01 kg/m.  Results for orders placed or performed in visit on 08/24/24 (from the past 24 hours)  POCT urine pregnancy   Collection Time: 08/24/24  9:17 AM  Result Value Ref Range   Preg Test, Ur Negative Negative     Time out was performed.  Speculum placed in the vagina, cervix fully visualized. Fleshy growth protruding from os ? Polyp vs from hx laser ablation  Cervix swabbed x 3 with acetic acid .  Acetowhitening present: Yes Cervix: no mosaicism, and acetowhite lesion(s) noted at 12 o'clock. Cervical biopsies taken at 12 o'clock and of fleshy growth/12 o'clock and Hemostasis achieved with Monsel's solution. Vagina: vaginal colposcopy not performed Vulva: vulvar colposcopy not performed  Specimens: 2  Complications:  pain  Chaperone: Winton Cherry  Colposcopic Impression & Plan:   ?HSIL Plan: Post biopsy instructions given, Will notify patient of results when back, and Will base plan of care on pathology results and ASCCP guidelines  Return in about 1 year (around 08/24/2025) for Pap & physical.  Suzen JONELLE Fetters CNM, [facility] 08/24/2024 9:56 AM

## 2024-08-24 NOTE — Patient Instructions (Signed)
 Colposcopy, Care After  The following information offers guidance on how to care for yourself after your procedure. Your health care provider may also give you more specific instructions. If you have problems or questions, contact your health care provider. What can I expect after the procedure? If you had a colposcopy without a biopsy, you can expect to feel fine right away after your procedure. However, you may have some spotting of blood for a few days. You can return to your normal activities. If you had a colposcopy with a biopsy, it is common after the procedure to have: Soreness and mild pain. These may last for a few days. Mild vaginal bleeding or discharge that is dark-colored and grainy. This may last for a few days. The discharge may be caused by a liquid (solution) that was used during the procedure. You may need to wear a sanitary pad during this time. Spotting of blood for at least 48 hours after the procedure. Follow these instructions at home: Medicines Take over-the-counter and prescription medicines only as told by your health care provider. Talk with your health care provider about what type of over-the-counter pain medicines and prescription medicines you can start to take again. It is especially important to talk with your health care provider if you take blood thinners. Activity Avoid using douche products, using tampons, and having sex for at least 3 days after the procedure or for as long as told by your health care provider. Return to your normal activities as told by your health care provider. Ask your health care provider what activities are safe for you. General instructions Ask your health care provider if you may take baths, swim, or use a hot tub. You may take showers. If you use birth control (contraception), continue to use it. Keep all follow-up visits. This is important. Contact a health care provider if: You have a fever or chills. You faint or feel  light-headed. Get help right away if: You have heavy bleeding from your vagina or pass blood clots. Heavy bleeding is bleeding that soaks through a sanitary pad in less than 1 hour. You have vaginal discharge that is abnormal, is yellow in color, or smells bad. This could be a sign of infection. You have severe pain or cramps in your lower abdomen that do not go away with medicine. Summary If you had a colposcopy without a biopsy, you can expect to feel fine right away, but you may have some spotting of blood for a few days. You can return to your normal activities. If you had a colposcopy with a biopsy, it is common to have mild pain for a few days and spotting for 48 hours after the procedure. Avoid using douche products, using tampons, and having sex for at least 3 days after the procedure or for as long as told by your health care provider. Get help right away if you have heavy bleeding, severe pain, or signs of infection. This information is not intended to replace advice given to you by your health care provider. Make sure you discuss any questions you have with your health care provider. Document Revised: 02/04/2021 Document Reviewed: 02/04/2021 Elsevier Patient Education  2024 [employer].

## 2024-08-24 NOTE — Telephone Encounter (Signed)
 Pharmacy Patient Advocate Encounter  Received notification from [employer] that Prior Authorization for Aimovig  70MG /ML auto-injectors  has been APPROVED from 08/24/2024 to 08/24/2025. Unable to obtain price due to refill too soon rejection, last fill date 08/18/2024 next available fill date12/19/2025   PA #/Case ID/Reference #: [id number]

## 2024-08-24 NOTE — Telephone Encounter (Signed)
 Pharmacy Patient Advocate Encounter   Received notification from [employer] that prior authorization for Aimovig  is required/requested.   Insurance verification completed.   The patient is insured through [employer].   Per test claim: PA required; PA submitted to above mentioned insurance via Latent Key/confirmation #/EOC [id number] Status is pending

## 2024-08-24 NOTE — Addendum Note (Signed)
 Addended by: SANNA GONG A on: 08/24/2024 09:59 AM   Modules accepted: Orders

## 2024-08-25 ENCOUNTER — Encounter: Payer: Self-pay | Admitting: Internal Medicine

## 2024-08-26 ENCOUNTER — Ambulatory Visit: Payer: Self-pay | Admitting: Women's Health

## 2024-08-26 LAB — SURGICAL PATHOLOGY

## 2024-08-31 ENCOUNTER — Other Ambulatory Visit: Payer: Self-pay | Admitting: Internal Medicine

## 2024-08-31 DIAGNOSIS — G43E19 Chronic migraine with aura, intractable, without status migrainosus: Secondary | ICD-10-CM

## 2024-09-01 ENCOUNTER — Encounter: Admitting: Advanced Practice Midwife

## 2024-09-03 ENCOUNTER — Ambulatory Visit: Admitting: Adult Health

## 2024-09-03 ENCOUNTER — Encounter: Payer: Self-pay | Admitting: Adult Health

## 2024-09-03 VITALS — BP 99/57 | HR 74 | Ht 68.0 in | Wt 192.0 lb

## 2024-09-03 DIAGNOSIS — R350 Frequency of micturition: Secondary | ICD-10-CM

## 2024-09-03 DIAGNOSIS — Z3046 Encounter for surveillance of implantable subdermal contraceptive: Secondary | ICD-10-CM | POA: Diagnosis not present

## 2024-09-03 DIAGNOSIS — R300 Dysuria: Secondary | ICD-10-CM | POA: Diagnosis not present

## 2024-09-03 DIAGNOSIS — Z3202 Encounter for pregnancy test, result negative: Secondary | ICD-10-CM | POA: Diagnosis not present

## 2024-09-03 LAB — POCT URINALYSIS DIPSTICK
Glucose, UA: NEGATIVE
Leukocytes, UA: NEGATIVE
Nitrite, UA: NEGATIVE
Protein, UA: NEGATIVE

## 2024-09-03 LAB — POCT URINE PREGNANCY: Preg Test, Ur: NEGATIVE

## 2024-09-03 MED ORDER — ETONOGESTREL 68 MG ~~LOC~~ IMPL
68.0000 mg | DRUG_IMPLANT | Freq: Once | SUBCUTANEOUS | Status: AC
  Administered 2024-09-03: 68 mg via SUBCUTANEOUS

## 2024-09-03 NOTE — Addendum Note (Signed)
 Addended by: NEYSA CLARITA RAMAN on: 09/03/2024 11:29 AM   Modules accepted: Orders

## 2024-09-03 NOTE — Progress Notes (Signed)
°  Subjective:     Patient ID: Debbie Santana, female   DOB: 1991/08/04, 33 y.o.   MRN: [id number]  HPI Debbie Santana is a 33 year old white female, single, G2P1011, in for nexplanon  removal and reinsertion, and she complains of  Burning and urinary frequency.     Component Value Date/Time   DIAGPAP - Low grade squamous intraepithelial lesion (LSIL) (A) 07/28/2024 1044   DIAGPAP - Low grade squamous intraepithelial lesion (LSIL) (A) 07/15/2023 1112   DIAGPAP (A) 06/13/2022 1453    - Atypical squamous cells of undetermined significance (ASC-US )   HPVHIGH Negative 07/28/2024 1044   HPVHIGH Negative 07/15/2023 1112   HPVHIGH Negative 06/13/2022 1453   ADEQPAP  07/28/2024 1044    Satisfactory for evaluation; transformation zone component PRESENT.   ADEQPAP  07/15/2023 1112    Satisfactory for evaluation; transformation zone component PRESENT.   ADEQPAP  06/13/2022 1453    Satisfactory for evaluation; transformation zone component PRESENT.   Colpo was performed 08/24/24, CIN 1 repeat pap in 1 year Review of Systems For nexplanon  removal and reinsertion +urinary frequency and burning Reviewed past medical,surgical, social and family history. Reviewed medications and allergies.     Objective:   Physical Exam BP (!) 99/57 (BP Location: Right Arm, Patient Position: Sitting, Cuff Size: Normal)   Pulse 74   Ht 5' 8 (1.727 m)   Wt 192 lb (87.1 kg)   LMP 08/30/2024 (Approximate)   BMI 29.19 kg/m  urine 2+ blood, UPT is negative Consent signed and time out called.  Left arm cleansed with betadine , and injected with 1.5 cc 2% lidocaine  and waited til numb.Under sterile technique a #11 blade was used to make small vertical incision, and a curved forceps was used to easily remove rod. New rod placed and palpated by provider and pt. Steri strips applied. Pressure dressing applied.   Upstream - 09/03/24 1016       Pregnancy Intention Screening   Does the patient want to become pregnant in the next  year? No    Does the patient's partner want to become pregnant in the next year? No    Would the patient like to discuss contraceptive options today? No      Contraception Wrap Up   Current Method Hormonal Implant    End Method Hormonal Implant    Contraception Counseling Provided Yes    How was the end contraceptive method provided? Provided on site             Assessment:     1. Negative pregnancy test - POCT urine pregnancy  2. Burning with urination UA C&S sent to rule out UTI - POCT Urinalysis Dipstick - Urinalysis, Routine w reflex microscopic - Urine Culture  3. Urinary frequency UA C&S sent  - POCT Urinalysis Dipstick - Urinalysis, Routine w reflex microscopic - Urine Culture  4. Encounter for removal and reinsertion of Nexplanon  (Primary) Rod removed and replaced left arm Lot [id number] Exp 2027-08 Use condoms x 2 weeks, keep clean and dry x 24 hours, no heavy lifting, keep steri strips on x 72 hours, Keep pressure dressing on x 24 hours. Follow up prn problems.     Plan:     Follow up prn

## 2024-09-03 NOTE — Patient Instructions (Signed)
Use condoms x 2 weeks, keep clean and dry x 24 hours, no heavy lifting, keep steri strips on x 72 hours, Keep pressure dressing on x 24 hours. Follow up prn problems.  

## 2024-09-04 LAB — URINALYSIS, ROUTINE W REFLEX MICROSCOPIC
Bilirubin, UA: NEGATIVE
Glucose, UA: NEGATIVE
Leukocytes,UA: NEGATIVE
Nitrite, UA: NEGATIVE
RBC, UA: NEGATIVE
Specific Gravity, UA: 1.02 (ref 1.005–1.030)
Urobilinogen, Ur: 0.2 mg/dL (ref 0.2–1.0)
pH, UA: 6 (ref 5.0–7.5)

## 2024-09-05 LAB — URINE CULTURE: Organism ID, Bacteria: NO GROWTH

## 2024-09-06 ENCOUNTER — Ambulatory Visit: Payer: Self-pay | Admitting: Adult Health

## 2024-09-07 ENCOUNTER — Ambulatory Visit: Admitting: Internal Medicine

## 2024-09-07 ENCOUNTER — Encounter: Payer: Self-pay | Admitting: Internal Medicine

## 2024-09-07 VITALS — BP 124/79 | HR 70 | Ht 68.0 in | Wt 192.6 lb

## 2024-09-07 DIAGNOSIS — E782 Mixed hyperlipidemia: Secondary | ICD-10-CM | POA: Diagnosis not present

## 2024-09-07 DIAGNOSIS — E559 Vitamin D deficiency, unspecified: Secondary | ICD-10-CM | POA: Diagnosis not present

## 2024-09-07 DIAGNOSIS — R739 Hyperglycemia, unspecified: Secondary | ICD-10-CM | POA: Diagnosis not present

## 2024-09-07 DIAGNOSIS — F331 Major depressive disorder, recurrent, moderate: Secondary | ICD-10-CM

## 2024-09-07 DIAGNOSIS — F411 Generalized anxiety disorder: Secondary | ICD-10-CM | POA: Diagnosis not present

## 2024-09-07 DIAGNOSIS — G43E19 Chronic migraine with aura, intractable, without status migrainosus: Secondary | ICD-10-CM

## 2024-09-07 DIAGNOSIS — M5116 Intervertebral disc disorders with radiculopathy, lumbar region: Secondary | ICD-10-CM | POA: Diagnosis not present

## 2024-09-07 DIAGNOSIS — Z720 Tobacco use: Secondary | ICD-10-CM

## 2024-09-07 DIAGNOSIS — G250 Essential tremor: Secondary | ICD-10-CM | POA: Diagnosis not present

## 2024-09-07 DIAGNOSIS — M4722 Other spondylosis with radiculopathy, cervical region: Secondary | ICD-10-CM

## 2024-09-07 DIAGNOSIS — J4530 Mild persistent asthma, uncomplicated: Secondary | ICD-10-CM

## 2024-09-07 MED ORDER — SERTRALINE HCL 100 MG PO TABS: 100.0000 mg | ORAL_TABLET | Freq: Every day | ORAL | 3 refills | Status: AC

## 2024-09-07 NOTE — Assessment & Plan Note (Addendum)
 S/p lumbar laminectomy Has chronic low back pain Was on chronic opioids, but has been recently tapered off by pain clinic On gabapentin  300 mg 3 times daily and Cymbalta  60 mg QD She reports no improvement in neuropathic pain with Cymbalta , DC Cymbalta 

## 2024-09-07 NOTE — Assessment & Plan Note (Signed)
 Recently had anterior cervical discectomy with fusion in 07/25 Followed by [facility] Followed by [facility] for pain management, takes gabapentin  and Cymbalta  currently Referred to [facility] as patient prefers to be placed on opioid for pain management

## 2024-09-07 NOTE — Assessment & Plan Note (Signed)
 Better controlled now - recently placed on Aimovig  well-controlled with Fioricet Has tried sumatriptan  in the past

## 2024-09-07 NOTE — Assessment & Plan Note (Signed)
 Smokes about 1 pack/day  Asked about quitting: confirms that he/she currently smokes cigarettes Advise to quit smoking: Educated about QUITTING to reduce the risk of cancer, cardio and cerebrovascular disease. Assess willingness: Unwilling to quit at this time, but is working on cutting back. Assist with counseling and pharmacotherapy: Counseled for 5 minutes and literature provided. Arrange for follow up: follow up in 3 months and continue to offer help.
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
## 2024-09-07 NOTE — Assessment & Plan Note (Addendum)
 Overall well-controlled currently On Klonopin  1 mg 4 times daily as needed - PDMP reviewed, refilled On Vraylar  1.5 mg QD, trazodone  100 mg QD and Cymbalta  60 mg QD DC Cymbalta  as she has had worsening of anxiety since switching from Zoloft  Started Zoloft  50 mg QD for 2 weeks and then increase to 100 mg QD, she used to take Zoloft  150 mg QD

## 2024-09-07 NOTE — Assessment & Plan Note (Signed)
 On primidone  50 mg QD Followed by neurology

## 2024-09-07 NOTE — Assessment & Plan Note (Signed)
 Has chronic cough, exertional dyspnea and wheezing Continue Spiriva as maintenance inhaler and as needed albuterol  for dyspnea or wheezing Referred to pulmonology for further evaluation Needs to cut down -> quit smoking

## 2024-09-07 NOTE — Assessment & Plan Note (Addendum)
 Uncontrolled Call Vraylar  1.5 mg QD On trazodone  100 mg nightly for insomnia On Cymbalta  60 mg QD, did not help with neuropathic pain and had worsening of her MDD and GAD since stopping Zoloft , DC Cymbalta  Started Zoloft  50 mg QD for 2 weeks and then increase to 100 mg QD, she used to take Zoloft  150 mg QD

## 2024-09-07 NOTE — Patient Instructions (Addendum)
 Please stop taking Cymbalta .  Please start taking Zoloft  50 mg after 3 days. Please take Zoloft  50 mg for 2 weeks and then increase to 100 mg once daily.  You are being referred to [facility] for pain management.  Please continue to take medications as prescribed.  Please continue to follow low salt diet and perform moderate exercise/walking as tolerated.

## 2024-09-14 ENCOUNTER — Ambulatory Visit: Admitting: Pulmonary Disease

## 2024-09-18 ENCOUNTER — Other Ambulatory Visit: Payer: Self-pay | Admitting: Internal Medicine

## 2024-09-18 DIAGNOSIS — F5101 Primary insomnia: Secondary | ICD-10-CM

## 2024-09-24 NOTE — Progress Notes (Signed)
 "  Established Patient Pulmonology Office Visit   Subjective:  Patient ID: Debbie Santana, female    DOB: Feb 08, 1991  MRN: [id number]  CC:  Chief Complaint  Patient presents with   COPD    Cxr 10/3 Coughing with yellow mucus     HPI  Debbie Santana is a 34 y.o. female with hx of migraines, essential tremors, and suspected COPD who presents for follow up.  Last seen on 06/22/2024, advised her to start Stiolto, discussed smoking cessation and ordered PFT.  Discussed the use of AI scribe software for clinical note transcription with the patient, who gave verbal consent to proceed.  History of Present Illness Debbie Santana is a 34 year old female who presents for follow-up of her respiratory condition.  The new inhaler, Tialto, has been beneficial, with improved breathing and increased energy for daily activities. She uses two puffs in the morning. Despite this, she experiences chest congestion and describes a sensation of 'coughing up needles' after using breathing treatments. She has a nebulizer at home and has used albuterol  twice in the past few months.  No recent need for prednisone  or antibiotics. No episodes of bronchitis or flu since the last visit. She believes she received a flu shot this year.  She continues to smoke over a pack of cigarettes daily, despite using nicotine  patches intermittently. Certain cigarettes, particularly those in the morning and after meals, are difficult to give up. She acknowledges the need to reduce her smoking habit.  Recent pulmonary function test showed a forced expiratory volume in one second (FEV1) of 3.8 liters (107% of predicted) and a forced vital capacity (FVC) of 4.8 liters (112% of predicted), with a FEV1/FVC ratio of 79%. Increased total lung capacity and residual volume noted. Diffusion capacity is reduced at 69%.  A recent chest X-ray showed no concerning spots. No significant respiratory infections or exacerbations recently.  CAT > 10,  MMRC > 2, no exacerbations.   ROS   Current Medications[1]      Objective:  BP 110/75   Pulse 77   Ht 5' 8 (1.727 m)   Wt 203 lb (92.1 kg)   LMP 09/24/2024 (Approximate)   SpO2 98% Comment: ra  BMI 30.87 kg/m  Wt Readings from Last 3 Encounters:  09/27/24 203 lb (92.1 kg)  09/07/24 192 lb 9.6 oz (87.4 kg)  09/03/24 192 lb (87.1 kg)   BMI Readings from Last 3 Encounters:  09/27/24 30.87 kg/m  09/07/24 29.28 kg/m  09/03/24 29.19 kg/m   SpO2 Readings from Last 3 Encounters:  09/27/24 98%  08/13/24 95%  06/23/24 98%    Physical Exam General: NAD, alert, WD, WN Eyes: PERRL, no scleral icterus ENMT: oropharynx clear, good dentition, erythematous tonsils, mallampati score I Skin: warm, intact, no rashes Neck: JVD flat, ROM and lymph node assessment normal CV: RRR, no MRG, nl S1 and S2, no peripheral edema Resp: expiratory wheezing diffusely, no clubbing Neuro: Awake alert oriented to person place time and situation  Diagnostic Review:  Last CBC Lab Results  Component Value Date   WBC 12.2 (H) 04/13/2024   HGB 14.9 04/13/2024   HCT 43.6 04/13/2024   MCV 94.4 04/13/2024   MCH 32.3 04/13/2024   RDW 12.1 04/13/2024   PLT 181 04/13/2024   Last metabolic panel Lab Results  Component Value Date   GLUCOSE 78 04/13/2024   NA 139 04/13/2024   K 4.3 04/13/2024   CL 105 04/13/2024   CO2 25 04/13/2024  BUN 5 (L) 04/13/2024   CREATININE 0.90 04/13/2024   GFRNONAA >60 04/13/2024   CALCIUM  9.7 04/13/2024   PHOS 3.3 12/17/2022   PROT 6.9 04/13/2024   ALBUMIN 3.9 04/13/2024   LABGLOB 2.3 05/01/2023   AGRATIO 1.7 09/14/2020   BILITOT 0.5 04/13/2024   ALKPHOS 57 04/13/2024   AST 17 04/13/2024   ALT 11 04/13/2024   ANIONGAP 9 04/13/2024    Absolute Eos Count 0 - 200 cells/uL  CXR within normal limits PFTs: borderline obstruction, hyperinflation    Assessment & Plan:   Assessment & Plan COPD without exacerbation (HCC) Assessment and Plan Assessment &  Plan Chronic obstructive pulmonary disease Symptoms of COPD with early signs on pulmonary function tests. No exacerbations. Smoking is a significant risk factor. MMRC > 2, CAT > 10, no exacerbations. Class B, Grade I. - Continue Stiolto inhaler, 2 puffs in the morning. - Prescribed albuterol  solution every 4 hours as needed. - Provided new nebulizer machine with face mask. - Advised to contact office for chest or cough issues for potential telephone visit or prescription adjustment. Nicotine  dependence with current use Nicotine  dependence Continued heavy smoking with inconsistent past nicotine  patch use. Discussed smoking cessation to prevent lung disease progression. - Prescribed nicotine  lozenges and gum every 2 hours as needed. - Advised combining nicotine  patches with lozenges or gum. - Instructed to contact 1-800-QUIT-NOW for support. - Scheduled follow-up in 4 months.  Smoking/Tobacco Cessation Counseling Debbie Santana is a current user of tobacco or nicotine  products. She is considering quitting at this time. Counseling provided today addressed the risks of continued use and the benefits of cessation. Discussed tobacco/nicotine  use history, readiness to quit, and evidence-based treatment options including behavioral strategies, support resources, and pharmacologic therapies. Provided encouragement and educational materials on steps and resources to quit smoking. Patient questions were addressed, and follow-up recommended for continued support. Total time spent on counseling: 6 minutes.   Orders Placed This Encounter  Procedures   AMB REFERRAL FOR DME   I spent 31 minutes reviewing patient's chart including prior consultant notes, imaging, and PFTs as well as face-to-face with the patient, over half in discussion of the diagnosis and the importance of compliance with the treatment plan.  Return in about 6 months (around 03/27/2025).   Taron Mondor, MD     [1]  Current  Outpatient Medications:    albuterol  (VENTOLIN  HFA) 108 (90 Base) MCG/ACT inhaler, Inhale 2 puffs into the lungs every 6 (six) hours as needed for wheezing or shortness of breath., Disp: 18 g, Rfl: 2   butalbital -acetaminophen -caffeine  (FIORICET) 50-325-40 MG tablet, TAKE 1 TABLET BY MOUTH EVERY 12 HOURS AS NEEDED FOR MIGRAINE HEADACHE, Disp: 60 tablet, Rfl: 1   clonazePAM  (KLONOPIN ) 1 MG tablet, TAKE 1 TABLET BY MOUTH 4 TIMES DAILY AS NEEDED FOR ANXIETY, Disp: 120 tablet, Rfl: 3   diphenhydrAMINE  (BENADRYL ) 25 MG tablet, Take 25 mg by mouth every 6 (six) hours as needed for allergies., Disp: , Rfl:    docusate sodium  (COLACE) 100 MG capsule, Take 1 capsule (100 mg total) by mouth 2 (two) times daily., Disp: 30 capsule, Rfl: 0   Erenumab -aooe (AIMOVIG ) 70 MG/ML SOAJ, Inject 70 mg into the skin every 30 (thirty) days., Disp: 1.12 mL, Rfl: 11   etonogestrel  (NEXPLANON ) 68 MG IMPL implant, 68 mg by Subdermal route once., Disp: , Rfl:    gabapentin  (NEURONTIN ) 300 MG capsule, Take 1 capsule (300 mg total) by mouth 3 (three) times daily., Disp: 90 capsule, Rfl:  3   lubiprostone  (AMITIZA ) 24 MCG capsule, Take 1 capsule (24 mcg total) by mouth 2 (two) times daily with a meal., Disp: 180 capsule, Rfl: 3   naloxone (NARCAN) nasal spray 4 mg/0.1 mL, SMARTSIG:1 Both Nares Daily, Disp: , Rfl:    omeprazole  (PRILOSEC) 20 MG capsule, Take 1 capsule (20 mg total) by mouth daily., Disp: 90 capsule, Rfl: 3   primidone  (MYSOLINE ) 50 MG tablet, Take 1 tablet (50 mg total) by mouth daily. Do not refill in less than 30 days., Disp: 30 tablet, Rfl: 11   promethazine  (PHENERGAN ) 12.5 MG suppository, Place 1 suppository (12.5 mg total) rectally every 8 (eight) hours as needed for nausea or vomiting., Disp: 120 each, Rfl: 1   sertraline  (ZOLOFT ) 100 MG tablet, Take 1 tablet (100 mg total) by mouth daily., Disp: 30 tablet, Rfl: 3   SPIRIVA RESPIMAT 2.5 MCG/ACT AERS, Inhale 2 each into the lungs daily as needed (respiratory  issues.)., Disp: , Rfl:    sucralfate  (CARAFATE ) 1 g tablet, Take 1 tablet (1 g total) by mouth 3 (three) times daily before meals., Disp: 90 tablet, Rfl: 3   SUMAtriptan  (IMITREX ) 100 MG tablet, May repeat in 2 hours if headache persists or recurs., Disp: 10 tablet, Rfl: 11   tiZANidine  (ZANAFLEX ) 2 MG tablet, Take 1 tablet (2 mg total) by mouth every 8 (eight) hours as needed for muscle spasms., Disp: 45 tablet, Rfl: 3   traZODone  (DESYREL ) 50 MG tablet, TAKE 2 TABLETS BY MOUTH AT BEDTIME, Disp: 60 tablet, Rfl: 0   valACYclovir  (VALTREX ) 1000 MG tablet, Take 1 tablet (1,000 mg total) by mouth 2 (two) times daily., Disp: 20 tablet, Rfl: 3   VRAYLAR  1.5 MG capsule, TAKE 1 CAPSULE BY MOUTH IN THE EVENING, Disp: 30 capsule, Rfl: 2   albuterol  (PROVENTIL ) (2.5 MG/3ML) 0.083% nebulizer solution, Take 3 mLs (2.5 mg total) by nebulization every 6 (six) hours as needed for wheezing or shortness of breath., Disp: 75 mL, Rfl: 5   Tiotropium Bromide-Olodaterol (STIOLTO RESPIMAT ) 2.5-2.5 MCG/ACT AERS, Inhale 2 puffs into the lungs daily., Disp: 12 g, Rfl: 6  "

## 2024-09-27 ENCOUNTER — Ambulatory Visit: Admitting: Pulmonary Disease

## 2024-09-27 ENCOUNTER — Encounter: Payer: Self-pay | Admitting: Pulmonary Disease

## 2024-09-27 VITALS — BP 110/75 | HR 77 | Ht 68.0 in | Wt 203.0 lb

## 2024-09-27 DIAGNOSIS — J449 Chronic obstructive pulmonary disease, unspecified: Secondary | ICD-10-CM | POA: Diagnosis not present

## 2024-09-27 DIAGNOSIS — F17200 Nicotine dependence, unspecified, uncomplicated: Secondary | ICD-10-CM

## 2024-09-27 DIAGNOSIS — F17210 Nicotine dependence, cigarettes, uncomplicated: Secondary | ICD-10-CM

## 2024-09-27 MED ORDER — STIOLTO RESPIMAT 2.5-2.5 MCG/ACT IN AERS: 2.0000 | INHALATION_SPRAY | Freq: Every day | RESPIRATORY_TRACT | 6 refills | Status: DC

## 2024-09-27 MED ORDER — ALBUTEROL SULFATE (2.5 MG/3ML) 0.083% IN NEBU: 2.5000 mg | INHALATION_SOLUTION | Freq: Four times a day (QID) | RESPIRATORY_TRACT | 5 refills | Status: DC | PRN

## 2024-09-27 NOTE — Patient Instructions (Signed)
" °  VISIT SUMMARY: You had a follow-up visit to discuss your respiratory condition. Your new inhaler, Tialto, has improved your breathing, but you still experience chest congestion and discomfort after using breathing treatments. We also discussed your smoking habit and strategies to help you quit.  YOUR PLAN: CHRONIC OBSTRUCTIVE PULMONARY DISEASE (COPD): You have COPD, which is affecting your breathing. Your recent tests show early signs of the disease, but no recent exacerbations. -Continue using the Stiolto inhaler, 2 puffs in the morning. -Use albuterol  solution every 4 hours as needed. -You have been provided with a new nebulizer machine with a face mask. -Contact our office if you experience any chest or cough issues for a potential telephone visit or prescription adjustment.  NICOTINE  DEPENDENCE: You continue to smoke heavily, which is a significant risk factor for your lung disease. -Use nicotine  lozenges and gum every 2 hours as needed. -Combine nicotine  patches with lozenges or gum to help reduce smoking. -Contact 1-800-QUIT-NOW for additional support. -We will have a follow-up appointment in 4 months to check on your progress.   Contains text generated by [employer].  "

## 2024-09-30 ENCOUNTER — Encounter: Admitting: Physical Medicine & Rehabilitation

## 2024-10-01 ENCOUNTER — Telehealth: Payer: Self-pay

## 2024-10-01 NOTE — Telephone Encounter (Signed)
 Copied from CRM #[id number]. Topic: Clinical - Order For Equipment >> Sep 28, 2024 10:19 AM Debbie Santana wrote: Reason for CRM: Pt stated she did not receive her CPAP machine that was ordered on October and looks another referral was sent today. Pt is stating insurance is advising her they have already provide the machine.  Calleed and informed pt that we will see what we can do and figure out - have my pcc on it

## 2024-10-16 ENCOUNTER — Other Ambulatory Visit: Payer: Self-pay | Admitting: Internal Medicine

## 2024-10-16 DIAGNOSIS — F5101 Primary insomnia: Secondary | ICD-10-CM

## 2024-10-19 ENCOUNTER — Emergency Department (HOSPITAL_COMMUNITY)

## 2024-10-19 ENCOUNTER — Observation Stay (HOSPITAL_COMMUNITY)
Admission: EM | Admit: 2024-10-19 | Discharge: 2024-10-20 | Disposition: A | Discharge status: Home / Self Care | Attending: Family Medicine | Admitting: Family Medicine

## 2024-10-19 ENCOUNTER — Encounter (HOSPITAL_COMMUNITY): Payer: Self-pay

## 2024-10-19 ENCOUNTER — Other Ambulatory Visit: Payer: Self-pay

## 2024-10-19 DIAGNOSIS — R1013 Epigastric pain: Secondary | ICD-10-CM

## 2024-10-19 DIAGNOSIS — G894 Chronic pain syndrome: Secondary | ICD-10-CM | POA: Diagnosis present

## 2024-10-19 DIAGNOSIS — J4530 Mild persistent asthma, uncomplicated: Secondary | ICD-10-CM

## 2024-10-19 DIAGNOSIS — R1110 Vomiting, unspecified: Principal | ICD-10-CM

## 2024-10-19 DIAGNOSIS — K2100 Gastro-esophageal reflux disease with esophagitis, without bleeding: Secondary | ICD-10-CM | POA: Diagnosis present

## 2024-10-19 DIAGNOSIS — F5101 Primary insomnia: Secondary | ICD-10-CM

## 2024-10-19 DIAGNOSIS — F331 Major depressive disorder, recurrent, moderate: Secondary | ICD-10-CM | POA: Diagnosis present

## 2024-10-19 DIAGNOSIS — Z720 Tobacco use: Secondary | ICD-10-CM | POA: Diagnosis present

## 2024-10-19 DIAGNOSIS — R1115 Cyclical vomiting syndrome unrelated to migraine: Secondary | ICD-10-CM | POA: Diagnosis not present

## 2024-10-19 DIAGNOSIS — G8929 Other chronic pain: Secondary | ICD-10-CM

## 2024-10-19 DIAGNOSIS — J449 Chronic obstructive pulmonary disease, unspecified: Secondary | ICD-10-CM

## 2024-10-19 LAB — COMPREHENSIVE METABOLIC PANEL WITH GFR
ALT: 11 U/L (ref 0–44)
AST: 14 U/L — ABNORMAL LOW (ref 15–41)
Albumin: 4.5 g/dL (ref 3.5–5.0)
Alkaline Phosphatase: 67 U/L (ref 38–126)
Anion gap: 12 (ref 5–15)
BUN: 7 mg/dL (ref 6–20)
CO2: 24 mmol/L (ref 22–32)
Calcium: 9.2 mg/dL (ref 8.9–10.3)
Chloride: 106 mmol/L (ref 98–111)
Creatinine, Ser: 0.85 mg/dL (ref 0.44–1.00)
GFR, Estimated: >60 mL/min
Glucose, Bld: 130 mg/dL — ABNORMAL HIGH (ref 70–99)
Potassium: 3.7 mmol/L (ref 3.5–5.1)
Sodium: 142 mmol/L (ref 135–145)
Total Bilirubin: 0.4 mg/dL (ref 0.0–1.2)
Total Protein: 7 g/dL (ref 6.5–8.1)

## 2024-10-19 LAB — URINALYSIS, ROUTINE W REFLEX MICROSCOPIC
Bilirubin Urine: NEGATIVE
Glucose, UA: NEGATIVE mg/dL
Hgb urine dipstick: NEGATIVE
Ketones, ur: NEGATIVE mg/dL
Leukocytes,Ua: NEGATIVE
Nitrite: NEGATIVE
Protein, ur: NEGATIVE mg/dL
Specific Gravity, Urine: >1.046 — ABNORMAL HIGH (ref 1.005–1.030)
pH: 6 (ref 5.0–8.0)

## 2024-10-19 LAB — URINE DRUG SCREEN
Amphetamines: NEGATIVE
Barbiturates: POSITIVE — AB
Benzodiazepines: POSITIVE — AB
Cocaine: NEGATIVE
Fentanyl: NEGATIVE
Methadone Scn, Ur: NEGATIVE
Opiates: NEGATIVE
Tetrahydrocannabinol: POSITIVE — AB

## 2024-10-19 LAB — CBC
HCT: 42.7 % (ref 36.0–46.0)
Hemoglobin: 14.8 g/dL (ref 12.0–15.0)
MCH: 32.5 pg (ref 26.0–34.0)
MCHC: 34.7 g/dL (ref 30.0–36.0)
MCV: 93.8 fL (ref 80.0–100.0)
Platelets: 249 10*3/uL (ref 150–400)
RBC: 4.55 MIL/uL (ref 3.87–5.11)
RDW: 12.3 % (ref 11.5–15.5)
WBC: 12.9 10*3/uL — ABNORMAL HIGH (ref 4.0–10.5)
nRBC: 0 % (ref 0.0–0.2)

## 2024-10-19 LAB — HCG, SERUM, QUALITATIVE: Preg, Serum: NEGATIVE

## 2024-10-19 LAB — LIPASE, BLOOD: Lipase: 21 U/L (ref 11–51)

## 2024-10-19 MED ORDER — TRAZODONE HCL 50 MG PO TABS
100.0000 mg | ORAL_TABLET | Freq: Every day | ORAL | Status: DC
  Administered 2024-10-19: 100 mg via ORAL
  Filled 2024-10-19: qty 2

## 2024-10-19 MED ORDER — TIOTROPIUM BROMIDE 2.5 MCG/ACT IN AERS: 2.0000 | INHALATION_SPRAY | Freq: Every day | RESPIRATORY_TRACT | Status: DC | PRN

## 2024-10-19 MED ORDER — SUCRALFATE 1 G PO TABS
1.0000 g | ORAL_TABLET | Freq: Once | ORAL | Status: AC
  Administered 2024-10-19: 1 g via ORAL
  Filled 2024-10-19: qty 1

## 2024-10-19 MED ORDER — HYDROXYZINE HCL 25 MG PO TABS
25.0000 mg | ORAL_TABLET | Freq: Three times a day (TID) | ORAL | Status: DC
  Administered 2024-10-19 – 2024-10-20 (×2): 25 mg via ORAL
  Filled 2024-10-19 (×2): qty 1

## 2024-10-19 MED ORDER — PROCHLORPERAZINE 25 MG RE SUPP
25.0000 mg | Freq: Once | RECTAL | Status: AC
  Administered 2024-10-19: 25 mg via RECTAL
  Filled 2024-10-19: qty 1

## 2024-10-19 MED ORDER — ALBUTEROL SULFATE (2.5 MG/3ML) 0.083% IN NEBU: 2.5000 mg | INHALATION_SOLUTION | RESPIRATORY_TRACT | Status: DC | PRN

## 2024-10-19 MED ORDER — UMECLIDINIUM BROMIDE 62.5 MCG/ACT IN AEPB
1.0000 | INHALATION_SPRAY | Freq: Every day | RESPIRATORY_TRACT | Status: DC
  Administered 2024-10-20: 1 via RESPIRATORY_TRACT
  Filled 2024-10-19: qty 7

## 2024-10-19 MED ORDER — BISACODYL 10 MG RE SUPP: 10.0000 mg | Freq: Every day | RECTAL | Status: DC | PRN

## 2024-10-19 MED ORDER — METOCLOPRAMIDE HCL 5 MG/ML IJ SOLN
10.0000 mg | Freq: Once | INTRAMUSCULAR | Status: AC
  Administered 2024-10-19: 10 mg via INTRAVENOUS
  Filled 2024-10-19: qty 2

## 2024-10-19 MED ORDER — SODIUM CHLORIDE 0.9 % IV BOLUS
1000.0000 mL | Freq: Once | INTRAVENOUS | Status: AC
  Administered 2024-10-19: 1000 mL via INTRAVENOUS

## 2024-10-19 MED ORDER — IOHEXOL 350 MG/ML SOLN: 100.0000 mL | Freq: Once | INTRAVENOUS | Status: DC | PRN

## 2024-10-19 MED ORDER — HEPARIN SODIUM (PORCINE) 5000 UNIT/ML IJ SOLN
5000.0000 [IU] | Freq: Three times a day (TID) | INTRAMUSCULAR | Status: DC
  Administered 2024-10-19 – 2024-10-20 (×2): 5000 [IU] via SUBCUTANEOUS
  Filled 2024-10-19 (×2): qty 1

## 2024-10-19 MED ORDER — CLONAZEPAM 0.5 MG PO TABS
0.5000 mg | ORAL_TABLET | Freq: Three times a day (TID) | ORAL | Status: DC | PRN
  Administered 2024-10-19: 0.5 mg via ORAL
  Filled 2024-10-19: qty 1

## 2024-10-19 MED ORDER — CARIPRAZINE HCL 1.5 MG PO CAPS
1.5000 mg | ORAL_CAPSULE | Freq: Every day | ORAL | Status: DC
  Administered 2024-10-19: 1.5 mg via ORAL
  Filled 2024-10-19 (×2): qty 1

## 2024-10-19 MED ORDER — ARFORMOTEROL TARTRATE 15 MCG/2ML IN NEBU
15.0000 ug | INHALATION_SOLUTION | Freq: Two times a day (BID) | RESPIRATORY_TRACT | Status: DC
  Administered 2024-10-19 – 2024-10-20 (×2): 15 ug via RESPIRATORY_TRACT
  Filled 2024-10-19 (×2): qty 2

## 2024-10-19 MED ORDER — ACETAMINOPHEN 325 MG PO TABS
650.0000 mg | ORAL_TABLET | Freq: Four times a day (QID) | ORAL | Status: DC | PRN
  Administered 2024-10-20: 650 mg via ORAL
  Filled 2024-10-19: qty 2

## 2024-10-19 MED ORDER — SODIUM CHLORIDE 0.9% FLUSH
3.0000 mL | Freq: Two times a day (BID) | INTRAVENOUS | Status: DC
  Administered 2024-10-19: 3 mL via INTRAVENOUS

## 2024-10-19 MED ORDER — SUMATRIPTAN SUCCINATE 50 MG PO TABS
50.0000 mg | ORAL_TABLET | Freq: Three times a day (TID) | ORAL | Status: DC | PRN
  Administered 2024-10-19 – 2024-10-20 (×2): 50 mg via ORAL

## 2024-10-19 MED ORDER — SODIUM CHLORIDE 0.9 % IV SOLN: INTRAVENOUS | Status: DC

## 2024-10-19 MED ORDER — ORAL CARE MOUTH RINSE: 15.0000 mL | OROMUCOSAL | Status: DC | PRN

## 2024-10-19 MED ORDER — DM-GUAIFENESIN ER 30-600 MG PO TB12
1.0000 | ORAL_TABLET | Freq: Two times a day (BID) | ORAL | Status: DC
  Administered 2024-10-19 – 2024-10-20 (×2): 1 via ORAL
  Filled 2024-10-19 (×2): qty 1

## 2024-10-19 MED ORDER — PRIMIDONE 50 MG PO TABS
50.0000 mg | ORAL_TABLET | Freq: Every day | ORAL | Status: DC
  Administered 2024-10-20: 50 mg via ORAL
  Filled 2024-10-19: qty 1

## 2024-10-19 MED ORDER — SERTRALINE HCL 50 MG PO TABS
100.0000 mg | ORAL_TABLET | Freq: Every day | ORAL | Status: DC
  Administered 2024-10-20: 100 mg via ORAL
  Filled 2024-10-19: qty 2

## 2024-10-19 MED ORDER — ACETAMINOPHEN 650 MG RE SUPP: 650.0000 mg | Freq: Four times a day (QID) | RECTAL | Status: DC | PRN

## 2024-10-19 MED ORDER — CLONAZEPAM 0.5 MG PO TABS
1.0000 mg | ORAL_TABLET | Freq: Once | ORAL | Status: AC
  Administered 2024-10-19: 1 mg via ORAL
  Filled 2024-10-19: qty 2

## 2024-10-19 MED ORDER — ONDANSETRON HCL 4 MG PO TABS
4.0000 mg | ORAL_TABLET | Freq: Four times a day (QID) | ORAL | Status: DC | PRN
  Administered 2024-10-19: 4 mg via ORAL
  Filled 2024-10-19: qty 1

## 2024-10-19 MED ORDER — IPRATROPIUM-ALBUTEROL 0.5-2.5 (3) MG/3ML IN SOLN
3.0000 mL | Freq: Three times a day (TID) | RESPIRATORY_TRACT | Status: DC
  Administered 2024-10-19: 3 mL via RESPIRATORY_TRACT
  Filled 2024-10-19: qty 3

## 2024-10-19 MED ORDER — SODIUM CHLORIDE 0.9% FLUSH: 3.0000 mL | INTRAVENOUS | Status: DC | PRN

## 2024-10-19 MED ORDER — DIPHENHYDRAMINE HCL 50 MG/ML IJ SOLN
25.0000 mg | Freq: Once | INTRAMUSCULAR | Status: AC
  Administered 2024-10-19: 25 mg via INTRAVENOUS
  Filled 2024-10-19: qty 1

## 2024-10-19 MED ORDER — IOHEXOL 300 MG/ML  SOLN
100.0000 mL | Freq: Once | INTRAMUSCULAR | Status: AC | PRN
  Administered 2024-10-19: 100 mL via INTRAVENOUS

## 2024-10-19 MED ORDER — IPRATROPIUM-ALBUTEROL 0.5-2.5 (3) MG/3ML IN SOLN
3.0000 mL | Freq: Three times a day (TID) | RESPIRATORY_TRACT | Status: DC
  Administered 2024-10-20: 3 mL via RESPIRATORY_TRACT
  Filled 2024-10-19: qty 3

## 2024-10-19 MED ORDER — DROPERIDOL 2.5 MG/ML IJ SOLN
1.2500 mg | Freq: Once | INTRAMUSCULAR | Status: AC
  Administered 2024-10-19: 1.25 mg via INTRAVENOUS
  Filled 2024-10-19: qty 2

## 2024-10-19 MED ORDER — ALBUTEROL SULFATE (2.5 MG/3ML) 0.083% IN NEBU: 2.5000 mg | INHALATION_SOLUTION | Freq: Four times a day (QID) | RESPIRATORY_TRACT | Status: DC | PRN

## 2024-10-19 MED ORDER — ALUM & MAG HYDROXIDE-SIMETH 200-200-20 MG/5ML PO SUSP
30.0000 mL | Freq: Four times a day (QID) | ORAL | Status: DC | PRN
  Administered 2024-10-19: 30 mL via ORAL
  Filled 2024-10-19: qty 30

## 2024-10-19 MED ORDER — METHOCARBAMOL 500 MG PO TABS
750.0000 mg | ORAL_TABLET | Freq: Four times a day (QID) | ORAL | Status: DC
  Administered 2024-10-19 – 2024-10-20 (×2): 750 mg via ORAL
  Filled 2024-10-19 (×2): qty 2

## 2024-10-19 MED ORDER — DIAZEPAM 5 MG/ML IJ SOLN
5.0000 mg | Freq: Once | INTRAMUSCULAR | Status: AC
  Administered 2024-10-19: 5 mg via INTRAVENOUS
  Filled 2024-10-19: qty 2

## 2024-10-19 MED ORDER — METHYLPREDNISOLONE SODIUM SUCC 40 MG IJ SOLR
40.0000 mg | Freq: Two times a day (BID) | INTRAMUSCULAR | Status: DC
  Administered 2024-10-19 – 2024-10-20 (×2): 40 mg via INTRAVENOUS
  Filled 2024-10-19 (×2): qty 1

## 2024-10-19 MED ORDER — CLONAZEPAM 0.5 MG PO TABS
0.5000 mg | ORAL_TABLET | Freq: Two times a day (BID) | ORAL | Status: DC | PRN
  Administered 2024-10-19 – 2024-10-20 (×2): 0.5 mg via ORAL
  Filled 2024-10-19 (×2): qty 1

## 2024-10-19 MED ORDER — SODIUM CHLORIDE 0.9 % IV SOLN: INTRAVENOUS | Status: DC | PRN

## 2024-10-19 MED ORDER — KETOROLAC TROMETHAMINE 15 MG/ML IJ SOLN
15.0000 mg | Freq: Once | INTRAMUSCULAR | Status: AC
  Administered 2024-10-19: 15 mg via INTRAVENOUS
  Filled 2024-10-19: qty 1

## 2024-10-19 MED ORDER — PANTOPRAZOLE SODIUM 40 MG IV SOLR
40.0000 mg | Freq: Once | INTRAVENOUS | Status: AC
  Administered 2024-10-19: 40 mg via INTRAVENOUS
  Filled 2024-10-19: qty 10

## 2024-10-19 MED ORDER — GABAPENTIN 300 MG PO CAPS
300.0000 mg | ORAL_CAPSULE | Freq: Three times a day (TID) | ORAL | Status: DC
  Administered 2024-10-19 – 2024-10-20 (×3): 300 mg via ORAL
  Filled 2024-10-19 (×3): qty 1

## 2024-10-19 MED ORDER — SODIUM CHLORIDE 0.9 % IV SOLN
25.0000 mg | Freq: Once | INTRAVENOUS | Status: AC
  Administered 2024-10-19: 25 mg via INTRAVENOUS
  Filled 2024-10-19: qty 1

## 2024-10-19 MED ORDER — PROCHLORPERAZINE EDISYLATE 10 MG/2ML IJ SOLN
10.0000 mg | Freq: Once | INTRAMUSCULAR | Status: AC | PRN
  Administered 2024-10-19: 10 mg via INTRAVENOUS
  Filled 2024-10-19: qty 2

## 2024-10-19 MED ORDER — TRAZODONE HCL 50 MG PO TABS: 50.0000 mg | ORAL_TABLET | Freq: Every evening | ORAL | Status: DC | PRN

## 2024-10-19 MED ORDER — AZITHROMYCIN 250 MG PO TABS
500.0000 mg | ORAL_TABLET | Freq: Every day | ORAL | Status: DC
  Administered 2024-10-19 – 2024-10-20 (×2): 500 mg via ORAL
  Filled 2024-10-19 (×2): qty 2

## 2024-10-19 MED ORDER — ONDANSETRON HCL 4 MG/2ML IJ SOLN
4.0000 mg | Freq: Four times a day (QID) | INTRAMUSCULAR | Status: DC | PRN
  Administered 2024-10-19 – 2024-10-20 (×2): 4 mg via INTRAVENOUS
  Filled 2024-10-19 (×2): qty 2

## 2024-10-19 MED ORDER — PANTOPRAZOLE SODIUM 40 MG PO TBEC
40.0000 mg | DELAYED_RELEASE_TABLET | Freq: Every day | ORAL | Status: DC
  Administered 2024-10-20: 40 mg via ORAL
  Filled 2024-10-19: qty 1

## 2024-10-19 MED ORDER — HYDROCODONE-ACETAMINOPHEN 5-325 MG PO TABS
1.0000 | ORAL_TABLET | Freq: Four times a day (QID) | ORAL | Status: AC | PRN
  Administered 2024-10-19 – 2024-10-20 (×2): 1 via ORAL
  Filled 2024-10-19 (×2): qty 1

## 2024-10-19 MED ORDER — POLYETHYLENE GLYCOL 3350 17 G PO PACK: 17.0000 g | PACK | Freq: Every day | ORAL | Status: DC | PRN

## 2024-10-19 NOTE — H&P (Signed)
 " History and Physical    Patient: Debbie Santana FMW:[id number] DOB: Feb 07, 1991 DOA: 10/19/2024 DOS: the patient was seen and examined on 10/19/2024 PCP: Tobie Suzzane POUR, MD  Patient coming from: Home  Chief Complaint:  Chief Complaint  Patient presents with   Abdominal Pain   HPI: Debbie Santana is a 34 y.o. female with medical history significant for COPD and history of ongoing tobacco abuse, depression anxiety, GERD and history of gastric ulcers, and ongoing marijuana abuse who presents to the ED with 3-day history of persistent emesis, abdominal pain as well as back and neck pain--history of prior neck surgery - Emesis is without bile or blood No fever  Or chills  - Failed trial of oral liquids in the ER after antiemetics were administered -Had diarrhea yesterday, no further diarrhea -- Patient denies vaginal or urinary symptoms - In the ED--CT abdomen and pelvis without acute findings - Urine tox screen with benzos barbiturates and THC - UA is not suggestive of UTI - hCG negative - WBC 12.9 patient has history of persistent leukocytosis in the past - Creatinine 0.85 LFTs are not elevated electrolytes WNL - Lipase 21  Review of Systems: As mentioned in the history of present illness. All other systems reviewed and are negative. Past Medical History:  Diagnosis Date   Anxiety    Bacterial vaginosis    Bradycardia 04/17/2017   Chest pain on breathing 02/22/2020   Chronic back pain    COPD (chronic obstructive pulmonary disease) (HCC)    Depression    Erosive gastritis 04/11/2017   Gastric ulcer    Genital warts    GERD (gastroesophageal reflux disease)    High cholesterol    History of gestational hypertension 05/21/2018   Dx intrapartum   History of kidney stones    Intertrigo 11/16/2019   Intractable abdominal pain 04/11/2017   Intractable nausea and vomiting 12/07/2018   Intractable vomiting 05/20/2020   Irregular intermenstrual bleeding 02/19/2019   Mental  disorder    Migraine without aura and without status migrainosus, not intractable 02/19/2019   Nausea 05/10/2020   Nausea & vomiting 12/07/2018   Nexplanon  in place 02/19/2019   Obesity 04/17/2017   Panic disorder    Peptic ulcer disease 04/13/2017   Pinched nerve 09/24/2019   PONV (postoperative nausea and vomiting)    Sciatica    Scoliosis    Tremor of both hands    Trichomoniasis    Vaginal Pap smear, abnormal    Wheezing    Past Surgical History:  Procedure Laterality Date   ANTERIOR CERVICAL DECOMP/DISCECTOMY FUSION N/A 04/22/2024   Procedure: ANTERIOR CERVICAL DECOMPRESSION/DISCECTOMY FUSION CERVICAL THREE-CERVICAL FOUR  PLATE/SCREWS;  Surgeon: Mavis Purchase, MD;  Location: [facility] OR;  Service: Neurosurgery;  Laterality: N/A;   BACK SURGERY     ruptured disc.   BACK SURGERY  11/2021   BIOPSY  04/14/2017   Procedure: BIOPSY;  Surgeon: Golda Claudis PENNER, MD;  [facility];  Service: Endoscopy;;  gastric   BIOPSY  05/22/2020   Procedure: BIOPSY;  Surgeon: Shaaron Lamar HERO, MD;  [facility]: [facility];  Service: Endoscopy;;   BIOPSY  08/29/2020   Procedure: BIOPSY;  Surgeon: Eartha Angelia Sieving, MD;  [facility]: [facility];  Service: Gastroenterology;;   BIOPSY  09/09/2020   Procedure: BIOPSY;  Surgeon: Cindie Carlin POUR, DO;  [facility];  Service: Endoscopy;;  duodenum;antral   BIOPSY  11/21/2021   Procedure: BIOPSY;  Surgeon: Golda Claudis PENNER, MD;  [facility];  Service: Endoscopy;;   BIOPSY  03/06/2023   Procedure: BIOPSY;  Surgeon: Eartha Angelia Sieving, MD;  [facility];  Service: Gastroenterology;;   CERVICAL ABLATION N/A 12/02/2018   Procedure: LASER ABLATION OF CERVIX;  Surgeon: Jayne Vonn DEL, MD;  Location: [facility];  Service: Gynecology;  Laterality: N/A;   CHOLECYSTECTOMY N/A 04/17/2017   Procedure: LAPAROSCOPIC CHOLECYSTECTOMY;  Surgeon: Mavis Anes, MD;  Location: [facility];  Service: General;  Laterality:  N/A;   COLONOSCOPY WITH PROPOFOL  N/A 08/29/2020   Procedure: COLONOSCOPY WITH PROPOFOL ;  Surgeon: Eartha Angelia Sieving, MD;  [facility];  Service: Gastroenterology;  Laterality: N/A;  11:15   COLPOSCOPY     ESOPHAGOGASTRODUODENOSCOPY (EGD) WITH PROPOFOL  N/A 04/14/2017   Procedure: ESOPHAGOGASTRODUODENOSCOPY (EGD) WITH PROPOFOL ;  Surgeon: Golda Claudis PENNER, MD;  [facility];  Service: Endoscopy;  Laterality: N/A;   ESOPHAGOGASTRODUODENOSCOPY (EGD) WITH PROPOFOL  N/A 05/22/2020   Procedure: ESOPHAGOGASTRODUODENOSCOPY (EGD) WITH PROPOFOL ;  Surgeon: Shaaron Lamar HERO, MD;  [facility];  Service: Endoscopy;  Laterality: N/A;   ESOPHAGOGASTRODUODENOSCOPY (EGD) WITH PROPOFOL  N/A 09/09/2020   Procedure: ESOPHAGOGASTRODUODENOSCOPY (EGD) WITH PROPOFOL ;  Surgeon: Cindie Carlin POUR, DO;  [facility];  Service: Endoscopy;  Laterality: N/A;   ESOPHAGOGASTRODUODENOSCOPY (EGD) WITH PROPOFOL  N/A 11/21/2020   Procedure: ESOPHAGOGASTRODUODENOSCOPY (EGD) WITH PROPOFOL ;  Surgeon: Eartha Angelia Sieving, MD;  [facility];  Service: Gastroenterology;  Laterality: N/A;  Am   ESOPHAGOGASTRODUODENOSCOPY (EGD) WITH PROPOFOL  N/A 11/21/2021   Procedure: ESOPHAGOGASTRODUODENOSCOPY (EGD) WITH PROPOFOL ;  Surgeon: Golda Claudis PENNER, MD;  [facility];  Service: Endoscopy;  Laterality: N/A;  820   ESOPHAGOGASTRODUODENOSCOPY (EGD) WITH PROPOFOL  N/A 03/06/2023   Procedure: ESOPHAGOGASTRODUODENOSCOPY (EGD) WITH PROPOFOL ;  Surgeon: Eartha Angelia Sieving, MD;  [facility];  Service: Gastroenterology;  Laterality: N/A;  11:45AM;ASA 2   HERNIA REPAIR Bilateral    inguinal   LASER ABLATION CONDOLAMATA N/A 12/02/2018   Procedure: LASER ABLATION CONDYLOMA ACCUMINATA LEFT AND RIGHT VULVA, PERINEUM AND PERIANAL (15 TOTAL);  Surgeon: Jayne Vonn DEL, MD;  Location: [facility];  Service: Gynecology;  Laterality: N/A;   LUMBAR LAMINECTOMY/DECOMPRESSION  MICRODISCECTOMY Right 12/03/2021   Procedure: MICRODISCECTOMY Lumbar four- five;  Surgeon: Mavis Purchase, MD;  Location: [facility] OR;  Service: Neurosurgery;  Laterality: Right;   MOUTH SURGERY  09/2021   All top teeth and all wisdom teeth removed   MOUTH SURGERY  12/2021   MOUTH SURGERY  02/2024   toothe removed   POLYPECTOMY  08/29/2020   Procedure: POLYPECTOMY;  Surgeon: Eartha Angelia Sieving, MD;  [facility];  Service: Gastroenterology;;   Social History:  reports that she has been smoking cigarettes. She has a 10 pack-year smoking history. She has been exposed to tobacco smoke. She has never used smokeless tobacco. She reports that she does not currently use alcohol . She reports current drug use. Drugs: Marijuana and Cocaine.  Allergies[1]  Family History  Problem Relation Age of Onset   Anxiety disorder Mother    Hyperlipidemia Mother    Crohn's disease Sister    Colon cancer Maternal Aunt    Diabetes Maternal Grandfather    Diabetes Cousin    Learning disabilities Cousin     Prior to Admission medications  Medication Sig Start Date End Date Taking? Authorizing Provider  albuterol  (PROVENTIL ) (2.5 MG/3ML) 0.083% nebulizer solution Take 3 mLs (2.5 mg total) by nebulization every 6 (six) hours as needed for wheezing or shortness of  breath. 09/27/24 09/27/25  Alghanim, Paula, MD  albuterol  (VENTOLIN  HFA) 108 (90 Base) MCG/ACT inhaler Inhale 2 puffs into the lungs every 6 (six) hours as needed for wheezing or shortness of breath. 05/04/24   Tobie Suzzane POUR, MD  butalbital -acetaminophen -caffeine  (FIORICET) 50-325-40 MG tablet TAKE 1 TABLET BY MOUTH EVERY 12 HOURS AS NEEDED FOR MIGRAINE HEADACHE 09/01/24   Tobie Suzzane POUR, MD  clonazePAM  (KLONOPIN ) 1 MG tablet TAKE 1 TABLET BY MOUTH 4 TIMES DAILY AS NEEDED FOR ANXIETY 08/25/24   Tobie Suzzane POUR, MD  diphenhydrAMINE  (BENADRYL ) 25 MG tablet Take 25 mg by mouth every 6 (six) hours as needed for allergies.    [provider]  docusate sodium  (COLACE) 100 MG capsule Take 1 capsule (100 mg total) by mouth 2 (two) times daily. 04/23/24   Mavis Purchase, MD  Erenumab -aooe (AIMOVIG ) 70 MG/ML SOAJ Inject 70 mg into the skin every 30 (thirty) days. 06/14/24   Onita Duos, MD  etonogestrel  (NEXPLANON ) 68 MG IMPL implant 68 mg by Subdermal route once.    [provider]  gabapentin  (NEURONTIN ) 300 MG capsule Take 1 capsule (300 mg total) by mouth 3 (three) times daily. 04/26/24   Emeline Joesph BROCKS, DO  lubiprostone  (AMITIZA ) 24 MCG capsule Take 1 capsule (24 mcg total) by mouth 2 (two) times daily with a meal. 04/12/24   Eartha Flavors, Toribio, MD  naloxone [facility]) nasal spray 4 mg/0.1 mL SMARTSIG:1 Both Nares Daily 04/23/24   [provider]  omeprazole  (PRILOSEC) 20 MG capsule Take 1 capsule (20 mg total) by mouth daily. 04/12/24   Eartha Flavors Toribio, MD  primidone  (MYSOLINE ) 50 MG tablet Take 1 tablet (50 mg total) by mouth daily. Do not refill in less than 30 days. 06/14/24   Onita Duos, MD  promethazine  (PHENERGAN ) 12.5 MG suppository Place 1 suppository (12.5 mg total) rectally every 8 (eight) hours as needed for nausea or vomiting. 04/12/24   Eartha Flavors, Toribio, MD  sertraline  (ZOLOFT ) 100 MG tablet Take 1 tablet (100 mg total) by mouth daily. 09/07/24   Tobie Suzzane POUR, MD  SPIRIVA RESPIMAT 2.5 MCG/ACT AERS Inhale 2 each into the lungs daily as needed (respiratory issues.). 09/24/22   [provider]  sucralfate  (CARAFATE ) 1 g tablet Take 1 tablet (1 g total) by mouth 3 (three) times daily before meals. 11/06/21   Carlan, Chelsea L, NP  SUMAtriptan  (IMITREX ) 100 MG tablet May repeat in 2 hours if headache persists or recurs. 06/14/24   Onita Duos, MD  Tiotropium Bromide -Olodaterol (STIOLTO RESPIMAT ) 2.5-2.5 MCG/ACT AERS Inhale 2 puffs into the lungs daily. 09/27/24   Alghanim, Paula, MD  tiZANidine  (ZANAFLEX ) 2 MG tablet Take 1 tablet (2 mg total) by mouth every 8 (eight) hours as needed for  muscle spasms. 04/26/24   Emeline Joesph C, DO  traZODone  (DESYREL ) 50 MG tablet TAKE 2 TABLETS BY MOUTH AT BEDTIME 10/19/24   Tobie Suzzane POUR, MD  valACYclovir  (VALTREX ) 1000 MG tablet Take 1 tablet (1,000 mg total) by mouth 2 (two) times daily. 08/05/23   Signa Delon LABOR, NP  VRAYLAR  1.5 MG capsule TAKE 1 CAPSULE BY MOUTH IN THE EVENING 07/29/24   Tobie Suzzane POUR, MD    Physical Exam: Vitals:   10/19/24 0930 10/19/24 0945 10/19/24 1557 10/19/24 1805  BP: (!) 166/91 (!) 160/86 (!) 159/93 (!) 141/83  Pulse: (!) 42 (!) 50 (!) 55 96  Resp: (!) 22 19 20    Temp:   98 F (36.7 C) 98 F (36.7  C)  TempSrc:   Oral Oral  SpO2: 97% 97% 100% 99%    Physical Exam Gen:- Awake Alert, complaining of abdominal pain and back pain HEENT:- [place].AT, No sclera icterus Neck-Supple Neck,No JVD,.  Lungs-few scattered wheezes, fair air movement bilaterally  CV- S1, S2 normal, RRR Abd-  +ve B.Sounds, Abd Soft, generalized abdominal discomfort on palpation, no CVA tenderness    Extremity/Skin:- No  edema,   good pedal pulses  Psych-affect is appropriate, oriented x3 Neuro-no new focal deficits, no tremors  Data Reviewed: significant for COPD and history of ongoing tobacco abuse, depression anxiety, GERD and history of gastric ulcers, and ongoing marijuana abuse who presents to the ED with 3-day history of persistent emesis, abdominal pain as well as back and neck pain--history of prior neck surgery - Emesis is without bile or blood No fever  Or chills  - Failed trial of oral liquids in the ER after antiemetics were administered -Had diarrhea yesterday, no further diarrhea -- Patient denies vaginal or urinary symptoms - In the ED--CT abdomen and pelvis without acute findings - Urine tox screen with benzos barbiturates and THC - UA is not suggestive of UTI - hCG negative - WBC 12.9 patient has history of persistent leukocytosis in the past - Creatinine 0.85 LFTs are not elevated electrolytes WNL - Lipase  21  Assessment and Plan: 1) intractable emesis--suspect cannabinoid hyperemesis syndrome - Urine tox with THC - UA negative - CT abdomen and pelvis without acute findings --Antiemetics as prescribed - Trial of oral liquids as tolerated  2)Leukocytosis-- ??  Reactive due to intractable emesis -- WBC 12.9 patient has history of persistent leukocytosis in the past -- No fevers  3)Tobacco abuse/Mild COPD exacerbation--steroids azithromycin  antibiotics as prescribed -- Smoking cessation advised - Patient declines nicotine  patch  4)Depression/anxiety--continue Zoloft , along with hydroxyzine  and may use trazodone  nightly  5)GERD--- history of peptic ulcer disease - Continue Protonix  especially given persistent emesis  6) chronic pain syndrome/chronic back pain/chronic neck pain status post prior neck surgery--- continue gabapentin , methocarbamol  -- May use IV Toradol  sparingly -- Be judicious with opiates patient has history of polysubstance abuse   Advance Care Planning:   Code Status: Full Code   Severity of Illness: The appropriate patient status for this patient is OBSERVATION. Observation status is judged to be reasonable and necessary in order to provide the required intensity of service to ensure the patient's safety. The patient's presenting symptoms, physical exam findings, and initial radiographic and laboratory data in the context of their medical condition is felt to place them at decreased risk for further clinical deterioration. Furthermore, it is anticipated that the patient will be medically stable for discharge from the hospital within 2 midnights of admission.   Author: Rendall Carwin, MD 10/19/2024 6:13 PM  For on call review [email or web address].     [1]  Allergies Allergen Reactions   Tape Other (See Comments)    Blisters   Buspirone  Other (See Comments)    Shaky/jittery   Fluoxetine Other (See Comments)    Ineffective; did not resolve depression/anxiety    Nicoderm [Nicotine ] Itching    Sharp pain at site  Updated 09/10/2020- patient tolerated nicotine  patch   Nsaids Other (See Comments)    History of ulcers   "

## 2024-10-19 NOTE — ED Notes (Signed)
 Report called to [facility] on 300.

## 2024-10-19 NOTE — ED Notes (Signed)
O2 while ambulating was 98%-100%

## 2024-10-19 NOTE — ED Notes (Signed)
 Provider notified of medication requested for abdominal pain.

## 2024-10-19 NOTE — ED Notes (Signed)
 Pt. showering

## 2024-10-19 NOTE — Progress Notes (Signed)
 Patient arrived to the unit. Vital signs taken, stable. Patient asking for pain medication after arrival, MD notified stated no reason for narcotics, gave patient Toradol  and Klonopin  per her request.   Patient advised no new orders placed at this time, will continue to monitor.

## 2024-10-19 NOTE — ED Triage Notes (Signed)
 Pt via [facility] c/o abdominal pain, neck pain and back pain x 3 days. Endorses emesis. Given 4mg  of zofran  and 500ml of NS.

## 2024-10-19 NOTE — ED Provider Notes (Signed)
 " [place] EMERGENCY DEPARTMENT AT [facility] Provider Note   CSN: [id number] Arrival date & time: 10/19/24  9385     Patient presents with: Abdominal Pain   Debbie Santana is a 34 y.o. female.   HPI 34 year old female presents with headache and vomiting as well as neck pain and diarrhea.  Patient states that she started off having a migraine headache which she has had many times before.  She has bilateral pulsating pain.  This is typical of her migraines.  She has had vomiting due to this.  The migraine has lasted longer than typical, however, and now she is starting to have some nonbloody diarrhea.  She is also having some epigastric pain.  She had neck surgery in the fall 2025 and states that due to her vomiting her neck is hurting worse than typical.  This has happened before with vomiting.  No fevers, neck stiffness, numbness or weakness in her extremities.  No vaginal symptoms or concern for STI. She is also noting that she is very anxious.  Prior to Admission medications  Medication Sig Start Date End Date Taking? Authorizing Provider  albuterol  (PROVENTIL ) (2.5 MG/3ML) 0.083% nebulizer solution Take 3 mLs (2.5 mg total) by nebulization every 6 (six) hours as needed for wheezing or shortness of breath. 09/27/24 09/27/25  Alghanim, Paula, MD  albuterol  (VENTOLIN  HFA) 108 (90 Base) MCG/ACT inhaler Inhale 2 puffs into the lungs every 6 (six) hours as needed for wheezing or shortness of breath. 05/04/24   Tobie Suzzane POUR, MD  butalbital -acetaminophen -caffeine  (FIORICET) 50-325-40 MG tablet TAKE 1 TABLET BY MOUTH EVERY 12 HOURS AS NEEDED FOR MIGRAINE HEADACHE 09/01/24   Tobie Suzzane POUR, MD  clonazePAM  (KLONOPIN ) 1 MG tablet TAKE 1 TABLET BY MOUTH 4 TIMES DAILY AS NEEDED FOR ANXIETY 08/25/24   Tobie Suzzane POUR, MD  diphenhydrAMINE  (BENADRYL ) 25 MG tablet Take 25 mg by mouth every 6 (six) hours as needed for allergies.    [provider]  docusate sodium  (COLACE) 100 MG capsule  Take 1 capsule (100 mg total) by mouth 2 (two) times daily. 04/23/24   Mavis Purchase, MD  Erenumab -aooe (AIMOVIG ) 70 MG/ML SOAJ Inject 70 mg into the skin every 30 (thirty) days. 06/14/24   Onita Duos, MD  etonogestrel  (NEXPLANON ) 68 MG IMPL implant 68 mg by Subdermal route once.    [provider]  gabapentin  (NEURONTIN ) 300 MG capsule Take 1 capsule (300 mg total) by mouth 3 (three) times daily. 04/26/24   Emeline Joesph BROCKS, DO  lubiprostone  (AMITIZA ) 24 MCG capsule Take 1 capsule (24 mcg total) by mouth 2 (two) times daily with a meal. 04/12/24   Eartha Flavors, Toribio, MD  naloxone [facility]) nasal spray 4 mg/0.1 mL SMARTSIG:1 Both Nares Daily 04/23/24   [provider]  omeprazole  (PRILOSEC) 20 MG capsule Take 1 capsule (20 mg total) by mouth daily. 04/12/24   Eartha Flavors Toribio, MD  primidone  (MYSOLINE ) 50 MG tablet Take 1 tablet (50 mg total) by mouth daily. Do not refill in less than 30 days. 06/14/24   Onita Duos, MD  promethazine  (PHENERGAN ) 12.5 MG suppository Place 1 suppository (12.5 mg total) rectally every 8 (eight) hours as needed for nausea or vomiting. 04/12/24   Eartha Flavors, Toribio, MD  sertraline  (ZOLOFT ) 100 MG tablet Take 1 tablet (100 mg total) by mouth daily. 09/07/24   Tobie Suzzane POUR, MD  SPIRIVA RESPIMAT 2.5 MCG/ACT AERS Inhale 2 each into the lungs daily as needed (respiratory issues.). 09/24/22  [provider]  sucralfate  (CARAFATE ) 1 g tablet Take 1 tablet (1 g total) by mouth 3 (three) times daily before meals. 11/06/21   Carlan, Chelsea L, NP  SUMAtriptan  (IMITREX ) 100 MG tablet May repeat in 2 hours if headache persists or recurs. 06/14/24   Onita Duos, MD  Tiotropium Bromide -Olodaterol (STIOLTO RESPIMAT ) 2.5-2.5 MCG/ACT AERS Inhale 2 puffs into the lungs daily. 09/27/24   Alghanim, Paula, MD  tiZANidine  (ZANAFLEX ) 2 MG tablet Take 1 tablet (2 mg total) by mouth every 8 (eight) hours as needed for muscle spasms. 04/26/24   Emeline Joesph BROCKS, DO   traZODone  (DESYREL ) 50 MG tablet TAKE 2 TABLETS BY MOUTH AT BEDTIME 10/19/24   Tobie Suzzane POUR, MD  valACYclovir  (VALTREX ) 1000 MG tablet Take 1 tablet (1,000 mg total) by mouth 2 (two) times daily. 08/05/23   Signa Delon LABOR, NP  VRAYLAR  1.5 MG capsule TAKE 1 CAPSULE BY MOUTH IN THE EVENING 07/29/24   Tobie Suzzane POUR, MD    Allergies: Tape, Buspirone , Fluoxetine, Nicoderm [nicotine ], and Nsaids    Review of Systems  Gastrointestinal:  Positive for abdominal pain, diarrhea, nausea and vomiting. Negative for blood in stool.  Musculoskeletal:  Positive for neck pain.  Neurological:  Positive for headaches. Negative for weakness and numbness.  Psychiatric/Behavioral:  The patient is nervous/anxious.     Updated Vital Signs BP (!) 160/86   Pulse (!) 50   Temp 98.3 F (36.8 C) (Oral)   Resp 19   LMP 09/24/2024 (Approximate)   SpO2 97%   Physical Exam Vitals and nursing note reviewed.  Constitutional:      Appearance: She is well-developed.  HENT:     Head: Normocephalic and atraumatic.  Eyes:     Extraocular Movements: Extraocular movements intact.     Pupils: Pupils are equal, round, and reactive to light.  Cardiovascular:     Rate and Rhythm: Normal rate and regular rhythm.     Heart sounds: Normal heart sounds.  Pulmonary:     Effort: Pulmonary effort is normal.     Breath sounds: Normal breath sounds.  Abdominal:     Palpations: Abdomen is soft.     Tenderness: There is abdominal tenderness (mild) in the right upper quadrant, epigastric area and left upper quadrant.  Musculoskeletal:     Cervical back: Normal range of motion. No rigidity. No spinous process tenderness or muscular tenderness.  Skin:    General: Skin is warm and dry.  Neurological:     Mental Status: She is alert.     Comments: CN 3-12 grossly intact. 5/5 strength in all 4 extremities. Grossly normal sensation. Normal finger to nose.      (all labs ordered are listed, but only abnormal results are  displayed) Labs Reviewed  COMPREHENSIVE METABOLIC PANEL WITH GFR - Abnormal; Notable for the following components:      Result Value   Glucose, Bld 130 (*)    AST 14 (*)    All other components within normal limits  CBC - Abnormal; Notable for the following components:   WBC 12.9 (*)    All other components within normal limits  URINALYSIS, ROUTINE W REFLEX MICROSCOPIC - Abnormal; Notable for the following components:   Specific Gravity, Urine >1.046 (*)    All other components within normal limits  URINE DRUG SCREEN - Abnormal; Notable for the following components:   Benzodiazepines POSITIVE (*)    Tetrahydrocannabinol POSITIVE (*)    Barbiturates POSITIVE (*)    All  other components within normal limits  LIPASE, BLOOD  HCG, SERUM, QUALITATIVE  HIV ANTIBODY (ROUTINE TESTING W REFLEX)    EKG: EKG Interpretation Date/Time:  Tuesday October 19 2024 08:00:44 EST Ventricular Rate:  62 PR Interval:  142 QRS Duration:  82 QT Interval:  441 QTC Calculation: 448 R Axis:   62  Text Interpretation: Sinus rhythm diffuse nonspecific flat T waves Confirmed by Freddi Hamilton [phone]) on 10/19/2024 8:15:45 AM  Radiology: CT ABDOMEN PELVIS W CONTRAST Result Date: 10/19/2024 CLINICAL DATA:  uncontrolled vomiting EXAM: CT ABDOMEN AND PELVIS WITH CONTRAST TECHNIQUE: Multidetector CT imaging of the abdomen and pelvis was performed using the standard protocol following bolus administration of intravenous contrast. RADIATION DOSE REDUCTION: This exam was performed according to the departmental dose-optimization program which includes automated exposure control, adjustment of the mA and/or kV according to patient size and/or use of iterative reconstruction technique. CONTRAST:  OMNIPAQUE  IOHEXOL  300 MG/ML  SOLN COMPARISON:  CT [facility], 12/12/2023 and 01/13/2023. FINDINGS: Lower chest: No acute abnormality. Hepatobiliary: No focal liver abnormality is seen. Cholecystectomy. No biliary dilatation.  Pancreas: No pancreatic ductal dilatation or surrounding inflammatory changes. Spleen: Normal in size without focal abnormality. Adrenals/Urinary Tract: Adrenal glands are unremarkable. Kidneys are normal, without renal calculi, focal lesion, or hydronephrosis. Bladder is unremarkable. Stomach/Bowel: Stomach is within normal limits. Appendix appears normal. No evidence of bowel wall thickening, distention, or inflammatory changes. Vascular/Lymphatic: No significant vascular findings are present. No enlarged abdominal or pelvic lymph nodes. Reproductive: Uterus and adnexa are unremarkable. Other: No abdominal wall hernia or abnormality. No abdominopelvic ascites. Musculoskeletal: No acute osseous findings. IMPRESSION: No acute abdominopelvic process. Electronically Signed   By: Thom Hall M.D.   On: 10/19/2024 12:01     Procedures   Medications Ordered in the ED  prochlorperazine  (COMPAZINE ) suppository 25 mg (has no administration in time range)  0.9 %  sodium chloride  infusion (has no administration in time range)  SUMAtriptan  (IMITREX ) tablet 50 mg (has no administration in time range)  sertraline  (ZOLOFT ) tablet 100 mg (has no administration in time range)  traZODone  (DESYREL ) tablet 100 mg (has no administration in time range)  cariprazine  (VRAYLAR ) capsule 1.5 mg (has no administration in time range)  pantoprazole  (PROTONIX ) EC tablet 40 mg (has no administration in time range)  clonazePAM  (KLONOPIN ) tablet 0.5 mg (has no administration in time range)  gabapentin  (NEURONTIN ) capsule 300 mg (has no administration in time range)  primidone  (MYSOLINE ) tablet 50 mg (has no administration in time range)  albuterol  (PROVENTIL ) (2.5 MG/3ML) 0.083% nebulizer solution 2.5 mg (has no administration in time range)  Tiotropium Bromide  AERS 2 each (has no administration in time range)  arformoterol  (BROVANA ) nebulizer solution 15 mcg (has no administration in time range)    And  umeclidinium bromide   (INCRUSE ELLIPTA ) 62.5 MCG/ACT 1 puff (has no administration in time range)  sodium chloride  flush (NS) 0.9 % injection 3 mL (has no administration in time range)  sodium chloride  flush (NS) 0.9 % injection 3 mL (has no administration in time range)  sodium chloride  flush (NS) 0.9 % injection 3 mL (has no administration in time range)  0.9 %  sodium chloride  infusion (has no administration in time range)  acetaminophen  (TYLENOL ) tablet 650 mg (has no administration in time range)    Or  acetaminophen  (TYLENOL ) suppository 650 mg (has no administration in time range)  traZODone  (DESYREL ) tablet 50 mg (has no administration in time range)  polyethylene glycol (MIRALAX  / GLYCOLAX ) packet 17 g (  has no administration in time range)  bisacodyl  (DULCOLAX) suppository 10 mg (has no administration in time range)  ondansetron  (ZOFRAN ) tablet 4 mg (has no administration in time range)    Or  ondansetron  (ZOFRAN ) injection 4 mg (has no administration in time range)  heparin  injection 5,000 Units (has no administration in time range)  sodium chloride  0.9 % bolus 1,000 mL (1,000 mLs Intravenous New Bag/Given 10/19/24 0721)  metoCLOPramide  (REGLAN ) injection 10 mg (10 mg Intravenous Given 10/19/24 0710)  diphenhydrAMINE  (BENADRYL ) injection 25 mg (25 mg Intravenous Given 10/19/24 0709)  ketorolac  (TORADOL ) 15 MG/ML injection 15 mg (15 mg Intravenous Given 10/19/24 0710)  clonazePAM  (KLONOPIN ) tablet 1 mg (1 mg Oral Given 10/19/24 0750)  diazepam  (VALIUM ) injection 5 mg (5 mg Intravenous Given 10/19/24 0800)  droperidol  (INAPSINE ) 2.5 MG/ML injection 1.25 mg (1.25 mg Intravenous Given 10/19/24 0911)  promethazine  (PHENERGAN ) 25 mg in sodium chloride  0.9 % 50 mL IVPB (25 mg Intravenous New Bag/Given 10/19/24 1136)  iohexol  (OMNIPAQUE ) 300 MG/ML solution 100 mL (100 mLs Intravenous Contrast Given 10/19/24 1137)                                    Medical Decision Making Amount and/or Complexity of Data  Reviewed External Data Reviewed: notes. Labs: ordered.    Details: Mild leukocytosis Radiology: ordered and independent interpretation performed.    Details: No bowel obstruction ECG/medicine tests: ordered and independent interpretation performed.    Details: Normal QTc  Risk Prescription drug management. Decision regarding hospitalization.   Patient presents with intractable vomiting.  Seems like the headache is gone but is still having a lot of vomiting.  Low suspicion for acute CNS emergency such as infection.  She is not altered.  Despite multiple rounds of meds, still unable to take in oral fluids without vomiting.  Could be cannabinoid hyperemesis.  Will need admission given inability to tolerate p.o.  Discussed with Dr. Pearlean.     Final diagnoses:  Intractable vomiting    ED Discharge Orders     None          Freddi Hamilton, MD 10/19/24 1351  "

## 2024-10-20 MED ORDER — GUAIFENESIN ER 600 MG PO TB12: 600.0000 mg | ORAL_TABLET | Freq: Two times a day (BID) | ORAL | 0 refills | Status: AC

## 2024-10-20 MED ORDER — IPRATROPIUM-ALBUTEROL 0.5-2.5 (3) MG/3ML IN SOLN: 3.0000 mL | Freq: Two times a day (BID) | RESPIRATORY_TRACT | Status: DC

## 2024-10-20 MED ORDER — ALBUTEROL SULFATE HFA 108 (90 BASE) MCG/ACT IN AERS: 2.0000 | INHALATION_SPRAY | Freq: Four times a day (QID) | RESPIRATORY_TRACT | 3 refills | Status: AC | PRN

## 2024-10-20 MED ORDER — STIOLTO RESPIMAT 2.5-2.5 MCG/ACT IN AERS: 2.0000 | INHALATION_SPRAY | Freq: Every day | RESPIRATORY_TRACT | 6 refills | Status: AC

## 2024-10-20 MED ORDER — PREDNISONE 20 MG PO TABS: 40.0000 mg | ORAL_TABLET | Freq: Every day | ORAL | 0 refills | Status: AC

## 2024-10-20 MED ORDER — METHOCARBAMOL 750 MG PO TABS: 750.0000 mg | ORAL_TABLET | Freq: Three times a day (TID) | ORAL | 3 refills | Status: AC

## 2024-10-20 MED ORDER — KETOROLAC TROMETHAMINE 15 MG/ML IJ SOLN
15.0000 mg | Freq: Once | INTRAMUSCULAR | Status: AC
  Administered 2024-10-20: 15 mg via INTRAVENOUS
  Filled 2024-10-20: qty 1

## 2024-10-20 MED ORDER — OMEPRAZOLE 20 MG PO CPDR: 20.0000 mg | DELAYED_RELEASE_CAPSULE | Freq: Every day | ORAL | 3 refills | Status: AC

## 2024-10-20 MED ORDER — TRAZODONE HCL 100 MG PO TABS: 100.0000 mg | ORAL_TABLET | Freq: Every day | ORAL | 5 refills | Status: AC

## 2024-10-20 MED ORDER — SUCRALFATE 1 G PO TABS: 1.0000 g | ORAL_TABLET | Freq: Three times a day (TID) | ORAL | 3 refills | Status: AC

## 2024-10-20 MED ORDER — PROCHLORPERAZINE EDISYLATE 10 MG/2ML IJ SOLN
10.0000 mg | Freq: Once | INTRAMUSCULAR | Status: AC | PRN
  Administered 2024-10-20: 10 mg via INTRAVENOUS
  Filled 2024-10-20: qty 2

## 2024-10-20 MED ORDER — PROCHLORPERAZINE MALEATE 10 MG PO TABS: 10.0000 mg | ORAL_TABLET | Freq: Four times a day (QID) | ORAL | 0 refills | Status: AC | PRN

## 2024-10-20 MED ORDER — DIPHENHYDRAMINE HCL 50 MG/ML IJ SOLN
12.5000 mg | Freq: Once | INTRAMUSCULAR | Status: AC
  Administered 2024-10-20: 12.5 mg via INTRAVENOUS
  Filled 2024-10-20: qty 1

## 2024-10-20 MED ORDER — AZITHROMYCIN 500 MG PO TABS: 500.0000 mg | ORAL_TABLET | Freq: Every day | ORAL | 0 refills | Status: AC

## 2024-10-20 MED ORDER — ALBUTEROL SULFATE (2.5 MG/3ML) 0.083% IN NEBU: 2.5000 mg | INHALATION_SOLUTION | Freq: Four times a day (QID) | RESPIRATORY_TRACT | 5 refills | Status: AC | PRN

## 2024-10-20 NOTE — Progress Notes (Signed)
 Transition of Care Department [facility]) has reviewed patient and no other TOC needs have been identified at this time. We will continue to monitor patient advancement through interdisciplinary progression rounds. If new patient transition needs arise, please place a TOC consult.   10/20/24 0841  TOC Brief Assessment  Insurance and Status Reviewed  Patient has primary care physician Yes  Home environment has been reviewed Lives with parents.  Prior level of function: Independent.  Prior/Current Home Services No current home services  Social Drivers of Health Review SDOH reviewed no interventions necessary  Readmission risk has been reviewed Yes  Transition of care needs no transition of care needs at this time

## 2024-10-20 NOTE — Discharge Instructions (Signed)
 1) complete abstinence from marijuana and tobacco advised 2)Avoid ibuprofen /Advil /Aleve /Motrin Josefine Powders/Naproxen /BC powders/Meloxicam/Diclofenac /Indomethacin and other Nonsteroidal anti-inflammatory medications as these will make you more likely to bleed and can cause stomach ulcers, can also cause Kidney problems.  3) drink plenty fluids and avoid dehydration---

## 2024-10-21 ENCOUNTER — Telehealth: Payer: Self-pay

## 2024-10-21 LAB — MISC LABCORP TEST (SEND OUT): Labcorp test code: 83935

## 2024-10-21 NOTE — Transitions of Care (Post Inpatient/ED Visit) (Signed)
 "  10/21/2024  Name: Debbie Santana MRN: [id number] DOB: [birth year]  Today's TOC FU Call Status: Today's TOC FU Call Status:: Successful TOC FU Call Completed TOC FU Call Complete Date: 10/21/24  Patient's Name and Date of Birth confirmed. Name, DOB  Transition Care Management Follow-up Telephone Call Date of Discharge: 10/20/24 [facility] Type of Discharge: Inpatient Admission Primary Inpatient Discharge Diagnosis:: vomiting How have you been since you were released from the hospital?: Better Any questions or concerns?: No  Items Reviewed: Did you receive and understand the discharge instructions provided?: Yes Medications obtained,verified, and reconciled?: Yes (Medications Reviewed) Any new allergies since your discharge?: No Dietary orders reviewed?: Yes Do you have support at home?: Yes People in Home [RPT]: parent(s)  Medications Reviewed Today: Medications Reviewed Today     Reviewed by Emmitt Pan, LPN (Licensed Practical Nurse) on 10/21/24 at 1450  Med List Status: <None>   Medication Order Taking? Sig Documenting Provider Last Dose Status Informant  albuterol  (PROVENTIL ) (2.5 MG/3ML) 0.083% nebulizer solution [id number] Yes Take 3 mLs (2.5 mg total) by nebulization every 6 (six) hours as needed for wheezing or shortness of breath. Pearlean Manus, MD  Active   albuterol  (VENTOLIN  HFA) 108 (90 Base) MCG/ACT inhaler [id number] Yes Inhale 2 puffs into the lungs every 6 (six) hours as needed for wheezing or shortness of breath. Pearlean Manus, MD  Active   azithromycin  (ZITHROMAX ) 500 MG tablet [id number] Yes Take 1 tablet (500 mg total) by mouth daily for 3 days. Pearlean Manus, MD  Active   butalbital -acetaminophen -caffeine  (FIORICET) 50-325-40 MG tablet [id number] Yes TAKE 1 TABLET BY MOUTH EVERY 12 HOURS AS NEEDED FOR MIGRAINE HEADACHE Patel, Rutwik K, MD  Active   clonazePAM  (KLONOPIN ) 1 MG tablet [id number] Yes TAKE 1 TABLET BY MOUTH 4  TIMES DAILY AS NEEDED FOR ANXIETY Tobie Suzzane POUR, MD  Active   diphenhydrAMINE  (BENADRYL ) 25 MG tablet [id number] Yes Take 25 mg by mouth every 6 (six) hours as needed for allergies. [provider]  Active Self  docusate sodium  (COLACE) 100 MG capsule [id number] Yes Take 1 capsule (100 mg total) by mouth 2 (two) times daily. Mavis Purchase, MD  Active   Erenumab -aooe (AIMOVIG ) 70 MG/ML EMMANUEL [id number] Yes Inject 70 mg into the skin every 30 (thirty) days. Onita Duos, MD  Active   etonogestrel  (NEXPLANON ) 68 MG IMPL implant [id number] Yes 68 mg by Subdermal route once. [provider]  Active Self           Med Note SOILA LYLE BROCKS   Tue Nov 14, 2020  4:15 PM)    gabapentin  (NEURONTIN ) 300 MG capsule [id number] Yes Take 1 capsule (300 mg total) by mouth 3 (three) times daily. Emeline Joesph BROCKS, DO  Active   guaiFENesin  (MUCINEX ) 600 MG 12 hr tablet [id number] Yes Take 1 tablet (600 mg total) by mouth 2 (two) times daily. Pearlean Manus, MD  Active   lubiprostone  (AMITIZA ) 24 MCG capsule [id number] Yes Take 1 capsule (24 mcg total) by mouth 2 (two) times daily with a meal. Eartha Flavors, Daniel, MD  Active   methocarbamol  (ROBAXIN ) 750 MG tablet [id number] Yes Take 1 tablet (750 mg total) by mouth 3 (three) times daily. Pearlean Manus, MD  Active   naloxone [facility]) nasal spray 4 mg/0.1 mL [id number] Yes SMARTSIG:1 Both Nares Daily [provider]  Active   omeprazole  (PRILOSEC) 20 MG capsule [id number] Yes Take 1 capsule (20 mg total) by mouth daily. Pearlean Manus, MD  Active   predniSONE  (DELTASONE ) 20 MG tablet [id number] Yes Take 2 tablets (40 mg total) by mouth daily with breakfast for 5 days. Pearlean Manus, MD  Active   primidone  (MYSOLINE ) 50 MG tablet [id number] Yes Take 1 tablet (50 mg total) by mouth daily. Do not refill in less than 30 days. Onita Duos, MD  Active   prochlorperazine  (COMPAZINE ) 10 MG tablet [id number] Yes Take 1 tablet (10 mg total) by mouth  every 6 (six) hours as needed for nausea or vomiting. Pearlean Manus, MD  Active   promethazine  (PHENERGAN ) 12.5 MG suppository [id number] Yes Place 1 suppository (12.5 mg total) rectally every 8 (eight) hours as needed for nausea or vomiting. Castaneda Mayorga, Toribio, MD  Active   sertraline  (ZOLOFT ) 100 MG tablet [id number] Yes Take 1 tablet (100 mg total) by mouth daily. Tobie Suzzane POUR, MD  Active   sucralfate  (CARAFATE ) 1 g tablet [id number] Yes Take 1 tablet (1 g total) by mouth 3 (three) times daily before meals. Pearlean Manus, MD  Active   SUMAtriptan  (IMITREX ) 100 MG tablet [id number] Yes May repeat in 2 hours if headache persists or recurs. Onita Duos, MD  Active   Tiotropium Bromide -Olodaterol (STIOLTO RESPIMAT ) 2.5-2.5 MCG/ACT AERS [id number] Yes Inhale 2 puffs into the lungs daily. Pearlean Manus, MD  Active   tiZANidine  (ZANAFLEX ) 2 MG tablet [id number] Yes Take 1 tablet (2 mg total) by mouth every 8 (eight) hours as needed for muscle spasms. Emeline Joesph BROCKS, DO  Active   traZODone  (DESYREL ) 100 MG tablet [id number] Yes Take 1 tablet (100 mg total) by mouth at bedtime. Pearlean Manus, MD  Active   valACYclovir  (VALTREX ) 1000 MG tablet [id number] Yes Take 1 tablet (1,000 mg total) by mouth 2 (two) times daily. Signa Delon LABOR, NP  Active Self  VRAYLAR  1.5 MG capsule [id number] Yes TAKE 1 CAPSULE BY MOUTH IN THE [place]AR[place] Tobie Suzzane POUR, MD  Active             Home Care and Equipment/Supplies: Were Home Health Services Ordered?: NA Any new equipment or medical supplies ordered?: NA  Functional Questionnaire: Do you need assistance with bathing/showering or dressing?: No Do you need assistance with meal preparation?: No Do you need assistance with eating?: No Do you have difficulty maintaining continence: No Do you need assistance with getting out of bed/getting out of a chair/moving?: No Do you have difficulty managing or taking your medications?: No  Follow up  appointments reviewed: PCP Follow-up appointment confirmed?: No (sent message to staff to schedule) MD Provider Line [phone] Given: No Specialist Hospital Follow-up appointment confirmed?: NA Do you need transportation to your follow-up appointment?: No Do you understand care options if your condition(s) worsen?: Yes-patient verbalized understanding    SIGNATURE Julian Lemmings, LPN [facility] Nurse Health Advisor Direct Dial [phone]  "

## 2024-10-23 ENCOUNTER — Other Ambulatory Visit: Payer: Self-pay | Admitting: Physical Medicine and Rehabilitation

## 2024-10-23 DIAGNOSIS — G8929 Other chronic pain: Secondary | ICD-10-CM

## 2024-10-23 DIAGNOSIS — G894 Chronic pain syndrome: Secondary | ICD-10-CM

## 2024-10-25 ENCOUNTER — Other Ambulatory Visit: Payer: Self-pay | Admitting: Internal Medicine

## 2024-10-25 DIAGNOSIS — F419 Anxiety disorder, unspecified: Secondary | ICD-10-CM

## 2024-10-28 ENCOUNTER — Other Ambulatory Visit: Payer: Self-pay | Admitting: Internal Medicine

## 2024-10-28 DIAGNOSIS — G43E19 Chronic migraine with aura, intractable, without status migrainosus: Secondary | ICD-10-CM

## 2024-11-03 ENCOUNTER — Inpatient Hospital Stay: Payer: Self-pay | Admitting: Internal Medicine

## 2024-11-05 ENCOUNTER — Encounter: Admitting: Physical Medicine & Rehabilitation

## 2025-01-06 ENCOUNTER — Ambulatory Visit: Payer: Self-pay | Admitting: Internal Medicine

## 2025-06-16 ENCOUNTER — Ambulatory Visit: Admitting: Neurology
